# Patient Record
Sex: Male | Born: 1950
Health system: Southern US, Community
[De-identification: ages and names within clinical notes are randomized; demographics above are authoritative.]

## PROBLEM LIST (undated history)

## (undated) DIAGNOSIS — A419 Sepsis, unspecified organism: Secondary | ICD-10-CM

## (undated) DIAGNOSIS — M47812 Spondylosis without myelopathy or radiculopathy, cervical region: Secondary | ICD-10-CM

## (undated) DIAGNOSIS — G4730 Sleep apnea, unspecified: Secondary | ICD-10-CM

## (undated) DIAGNOSIS — E785 Hyperlipidemia, unspecified: Secondary | ICD-10-CM

## (undated) DIAGNOSIS — R51 Headache: Secondary | ICD-10-CM

## (undated) DIAGNOSIS — Z87891 Personal history of nicotine dependence: Secondary | ICD-10-CM

## (undated) DIAGNOSIS — M1990 Unspecified osteoarthritis, unspecified site: Secondary | ICD-10-CM

## (undated) DIAGNOSIS — R1013 Epigastric pain: Secondary | ICD-10-CM

## (undated) DIAGNOSIS — R011 Cardiac murmur, unspecified: Secondary | ICD-10-CM

## (undated) DIAGNOSIS — D3130 Benign neoplasm of unspecified choroid: Secondary | ICD-10-CM

## (undated) DIAGNOSIS — E790 Hyperuricemia without signs of inflammatory arthritis and tophaceous disease: Secondary | ICD-10-CM

## (undated) DIAGNOSIS — K760 Fatty (change of) liver, not elsewhere classified: Secondary | ICD-10-CM

## (undated) DIAGNOSIS — N390 Urinary tract infection, site not specified: Secondary | ICD-10-CM

## (undated) DIAGNOSIS — I5181 Takotsubo syndrome: Secondary | ICD-10-CM

## (undated) DIAGNOSIS — E119 Type 2 diabetes mellitus without complications: Secondary | ICD-10-CM

## (undated) DIAGNOSIS — E669 Obesity, unspecified: Secondary | ICD-10-CM

## (undated) DIAGNOSIS — K219 Gastro-esophageal reflux disease without esophagitis: Secondary | ICD-10-CM

## (undated) DIAGNOSIS — D508 Other iron deficiency anemias: Secondary | ICD-10-CM

## (undated) DIAGNOSIS — D1771 Benign lipomatous neoplasm of kidney: Secondary | ICD-10-CM

## (undated) DIAGNOSIS — I2510 Atherosclerotic heart disease of native coronary artery without angina pectoris: Secondary | ICD-10-CM

## (undated) DIAGNOSIS — R0789 Other chest pain: Secondary | ICD-10-CM

## (undated) DIAGNOSIS — Z8614 Personal history of Methicillin resistant Staphylococcus aureus infection: Secondary | ICD-10-CM

## (undated) DIAGNOSIS — Z9289 Personal history of other medical treatment: Secondary | ICD-10-CM

## (undated) HISTORY — DX: Type 2 diabetes mellitus without complications: E11.9

## (undated) HISTORY — DX: Personal history of other medical treatment: Z92.89

## (undated) HISTORY — DX: Epigastric pain: R10.13

## (undated) HISTORY — DX: Sepsis, unspecified organism: A41.9

## (undated) HISTORY — DX: Takotsubo syndrome: I51.81

## (undated) HISTORY — DX: Unspecified osteoarthritis, unspecified site: M19.90

## (undated) HISTORY — DX: Other chest pain: R07.89

## (undated) HISTORY — DX: Obesity, unspecified: E66.9

## (undated) HISTORY — DX: Gastro-esophageal reflux disease without esophagitis: K21.9

## (undated) HISTORY — DX: Other iron deficiency anemias: D50.8

## (undated) HISTORY — DX: Headache: R51

## (undated) HISTORY — DX: Cardiac murmur, unspecified: R01.1

## (undated) HISTORY — DX: Hyperuricemia without signs of inflammatory arthritis and tophaceous disease: E79.0

## (undated) HISTORY — DX: Benign neoplasm of unspecified choroid: D31.30

## (undated) HISTORY — DX: Sepsis, unspecified organism: N39.0

## (undated) HISTORY — PX: CARDIAC CATHETERIZATION: SHX172

## (undated) HISTORY — DX: Spondylosis without myelopathy or radiculopathy, cervical region: M47.812

## (undated) HISTORY — DX: Personal history of nicotine dependence: Z87.891

## (undated) HISTORY — DX: Sleep apnea, unspecified: G47.30

## (undated) HISTORY — DX: Hyperlipidemia, unspecified: E78.5

## (undated) HISTORY — DX: Fatty (change of) liver, not elsewhere classified: K76.0

## (undated) HISTORY — DX: Benign lipomatous neoplasm of kidney: D17.71

## (undated) HISTORY — DX: Atherosclerotic heart disease of native coronary artery without angina pectoris: I25.10

## (undated) HISTORY — DX: Personal history of Methicillin resistant Staphylococcus aureus infection: Z86.14

---

## 1986-04-20 HISTORY — PX: KNEE ARTHROSCOPY: SUR90

## 1995-06-19 ENCOUNTER — Encounter: Payer: Self-pay | Admitting: Family Medicine

## 1995-06-19 LAB — CONVERTED CEMR LAB: Hgb A1c MFr Bld: 12.3 %

## 1995-10-19 ENCOUNTER — Encounter: Payer: Self-pay | Admitting: Family Medicine

## 1995-10-19 LAB — CONVERTED CEMR LAB: Hgb A1c MFr Bld: 4.5 %

## 1996-04-20 ENCOUNTER — Encounter: Payer: Self-pay | Admitting: Family Medicine

## 1996-04-20 LAB — CONVERTED CEMR LAB
Hgb A1c MFr Bld: 4.8 %
Microalbumin U total vol: 12.5 mg/L

## 1997-03-20 ENCOUNTER — Encounter: Payer: Self-pay | Admitting: Family Medicine

## 1997-03-20 LAB — CONVERTED CEMR LAB: Hgb A1c MFr Bld: 5.8 %

## 1998-02-18 ENCOUNTER — Encounter: Payer: Self-pay | Admitting: Family Medicine

## 1998-02-18 LAB — CONVERTED CEMR LAB: Hgb A1c MFr Bld: 5.9 %

## 1998-08-19 ENCOUNTER — Encounter: Payer: Self-pay | Admitting: Family Medicine

## 1998-08-19 LAB — CONVERTED CEMR LAB: Microalbumin U total vol: 4.1 mg/L

## 1998-09-19 ENCOUNTER — Encounter: Payer: Self-pay | Admitting: Family Medicine

## 1998-09-19 LAB — CONVERTED CEMR LAB: Hgb A1c MFr Bld: 5.5 %

## 1998-09-27 DIAGNOSIS — I5181 Takotsubo syndrome: Secondary | ICD-10-CM | POA: Insufficient documentation

## 1998-09-27 HISTORY — DX: Takotsubo syndrome: I51.81

## 1999-03-21 ENCOUNTER — Encounter: Payer: Self-pay | Admitting: Family Medicine

## 1999-03-21 LAB — CONVERTED CEMR LAB: Hgb A1c MFr Bld: 6 %

## 1999-08-19 ENCOUNTER — Encounter: Payer: Self-pay | Admitting: Family Medicine

## 1999-08-19 LAB — CONVERTED CEMR LAB
Hgb A1c MFr Bld: 6.1 %
Microalbumin U total vol: 5.1 mg/L

## 2000-07-19 ENCOUNTER — Encounter: Payer: Self-pay | Admitting: Family Medicine

## 2000-07-19 LAB — CONVERTED CEMR LAB
Hgb A1c MFr Bld: 6 %
Microalbumin U total vol: 3.6 mg/L

## 2001-04-20 DIAGNOSIS — I2510 Atherosclerotic heart disease of native coronary artery without angina pectoris: Secondary | ICD-10-CM

## 2001-04-20 HISTORY — DX: Atherosclerotic heart disease of native coronary artery without angina pectoris: I25.10

## 2001-08-18 ENCOUNTER — Encounter: Payer: Self-pay | Admitting: Family Medicine

## 2001-08-18 LAB — CONVERTED CEMR LAB
Hgb A1c MFr Bld: 5.6 %
Microalbumin U total vol: 3.4 mg/L

## 2001-12-09 ENCOUNTER — Observation Stay (HOSPITAL_COMMUNITY): Admission: EM | Admit: 2001-12-09 | Discharge: 2001-12-12 | Payer: Self-pay | Admitting: Cardiology

## 2001-12-12 DIAGNOSIS — I2510 Atherosclerotic heart disease of native coronary artery without angina pectoris: Secondary | ICD-10-CM | POA: Insufficient documentation

## 2002-01-18 ENCOUNTER — Encounter: Payer: Self-pay | Admitting: Family Medicine

## 2002-01-18 LAB — CONVERTED CEMR LAB: PSA: 0.4 ng/mL

## 2002-08-19 ENCOUNTER — Encounter: Payer: Self-pay | Admitting: Family Medicine

## 2002-08-19 LAB — CONVERTED CEMR LAB
Hgb A1c MFr Bld: 6 %
Microalbumin U total vol: 2.6 mg/L
PSA: 0.4 ng/mL

## 2002-12-06 LAB — HM COLONOSCOPY: HM Colonoscopy: NORMAL

## 2003-04-06 ENCOUNTER — Encounter: Admission: RE | Admit: 2003-04-06 | Discharge: 2003-04-06 | Payer: Self-pay | Admitting: Orthopedic Surgery

## 2003-04-19 ENCOUNTER — Encounter: Admission: RE | Admit: 2003-04-19 | Discharge: 2003-04-19 | Payer: Self-pay | Admitting: Orthopedic Surgery

## 2003-05-21 HISTORY — PX: KNEE SURGERY: SHX244

## 2003-08-19 ENCOUNTER — Encounter: Payer: Self-pay | Admitting: Family Medicine

## 2003-08-19 LAB — CONVERTED CEMR LAB
Hgb A1c MFr Bld: 5.7 %
Microalbumin U total vol: 1.7 mg/L
PSA: 0.4 ng/mL

## 2003-11-02 ENCOUNTER — Ambulatory Visit (HOSPITAL_BASED_OUTPATIENT_CLINIC_OR_DEPARTMENT_OTHER): Admission: RE | Admit: 2003-11-02 | Discharge: 2003-11-02 | Payer: Self-pay | Admitting: Pulmonary Disease

## 2003-11-15 ENCOUNTER — Encounter: Admission: RE | Admit: 2003-11-15 | Discharge: 2003-11-15 | Payer: Self-pay | Admitting: Neurosurgery

## 2003-11-19 HISTORY — PX: MYELOGRAM: SHX5347

## 2004-01-02 ENCOUNTER — Ambulatory Visit (HOSPITAL_BASED_OUTPATIENT_CLINIC_OR_DEPARTMENT_OTHER): Admission: RE | Admit: 2004-01-02 | Discharge: 2004-01-02 | Payer: Self-pay | Admitting: Pulmonary Disease

## 2004-03-20 ENCOUNTER — Encounter: Payer: Self-pay | Admitting: Family Medicine

## 2004-03-20 LAB — CONVERTED CEMR LAB: Hgb A1c MFr Bld: 6 %

## 2004-04-08 ENCOUNTER — Ambulatory Visit: Payer: Self-pay | Admitting: Family Medicine

## 2004-04-10 ENCOUNTER — Ambulatory Visit: Payer: Self-pay | Admitting: Family Medicine

## 2004-06-09 ENCOUNTER — Ambulatory Visit: Payer: Self-pay | Admitting: Family Medicine

## 2004-06-16 ENCOUNTER — Ambulatory Visit: Payer: Self-pay | Admitting: Family Medicine

## 2004-08-20 ENCOUNTER — Ambulatory Visit: Payer: Self-pay | Admitting: Family Medicine

## 2004-08-21 ENCOUNTER — Ambulatory Visit: Payer: Self-pay | Admitting: Family Medicine

## 2004-09-17 ENCOUNTER — Ambulatory Visit: Payer: Self-pay | Admitting: Family Medicine

## 2004-12-15 ENCOUNTER — Ambulatory Visit: Payer: Self-pay | Admitting: Family Medicine

## 2004-12-19 DIAGNOSIS — Z9289 Personal history of other medical treatment: Secondary | ICD-10-CM

## 2004-12-19 HISTORY — DX: Personal history of other medical treatment: Z92.89

## 2005-03-20 ENCOUNTER — Encounter: Payer: Self-pay | Admitting: Family Medicine

## 2005-03-20 LAB — CONVERTED CEMR LAB: Hgb A1c MFr Bld: 5.5 %

## 2005-03-24 ENCOUNTER — Ambulatory Visit: Payer: Self-pay | Admitting: Family Medicine

## 2005-03-27 ENCOUNTER — Ambulatory Visit: Payer: Self-pay | Admitting: Family Medicine

## 2005-07-10 ENCOUNTER — Ambulatory Visit: Payer: Self-pay | Admitting: Family Medicine

## 2005-09-18 ENCOUNTER — Encounter: Payer: Self-pay | Admitting: Family Medicine

## 2005-09-18 LAB — CONVERTED CEMR LAB: Hgb A1c MFr Bld: 6.8 %

## 2005-09-30 ENCOUNTER — Ambulatory Visit: Payer: Self-pay | Admitting: Family Medicine

## 2005-09-30 LAB — CONVERTED CEMR LAB
Microalbumin U total vol: 2.1 mg/L
PSA: 0.47 ng/mL

## 2005-10-02 ENCOUNTER — Ambulatory Visit: Payer: Self-pay | Admitting: Family Medicine

## 2005-10-13 ENCOUNTER — Ambulatory Visit: Payer: Self-pay | Admitting: Family Medicine

## 2005-12-23 ENCOUNTER — Ambulatory Visit: Payer: Self-pay | Admitting: Family Medicine

## 2006-02-25 ENCOUNTER — Encounter: Admission: RE | Admit: 2006-02-25 | Discharge: 2006-02-25 | Payer: Self-pay | Admitting: Internal Medicine

## 2006-04-28 ENCOUNTER — Ambulatory Visit: Payer: Self-pay | Admitting: Family Medicine

## 2006-04-28 LAB — CONVERTED CEMR LAB: Hgb A1c MFr Bld: 6.6 %

## 2006-04-30 ENCOUNTER — Ambulatory Visit: Payer: Self-pay | Admitting: Family Medicine

## 2006-10-18 ENCOUNTER — Ambulatory Visit: Payer: Self-pay | Admitting: Family Medicine

## 2006-10-18 DIAGNOSIS — E1165 Type 2 diabetes mellitus with hyperglycemia: Secondary | ICD-10-CM

## 2006-10-18 DIAGNOSIS — E1140 Type 2 diabetes mellitus with diabetic neuropathy, unspecified: Secondary | ICD-10-CM

## 2006-10-18 DIAGNOSIS — E1142 Type 2 diabetes mellitus with diabetic polyneuropathy: Secondary | ICD-10-CM | POA: Insufficient documentation

## 2006-10-18 DIAGNOSIS — E1122 Type 2 diabetes mellitus with diabetic chronic kidney disease: Secondary | ICD-10-CM | POA: Insufficient documentation

## 2006-10-18 LAB — CONVERTED CEMR LAB
ALT: 22 units/L (ref 0–53)
AST: 26 units/L (ref 0–37)
Albumin: 3.8 g/dL (ref 3.5–5.2)
Alkaline Phosphatase: 46 units/L (ref 39–117)
BUN: 27 mg/dL — ABNORMAL HIGH (ref 6–23)
Basophils Absolute: 0 10*3/uL (ref 0.0–0.1)
Basophils Relative: 0.3 % (ref 0.0–1.0)
Bilirubin, Direct: 0.1 mg/dL (ref 0.0–0.3)
CO2: 29 meq/L (ref 19–32)
Calcium: 9.6 mg/dL (ref 8.4–10.5)
Chloride: 111 meq/L (ref 96–112)
Cholesterol: 135 mg/dL (ref 0–200)
Creatinine, Ser: 0.9 mg/dL (ref 0.4–1.5)
Creatinine,U: 195.3 mg/dL
Eosinophils Absolute: 0.1 10*3/uL (ref 0.0–0.6)
Eosinophils Relative: 2.9 % (ref 0.0–5.0)
GFR calc Af Amer: 113 mL/min
GFR calc non Af Amer: 93 mL/min
Glucose, Bld: 105 mg/dL — ABNORMAL HIGH (ref 70–99)
HCT: 36.1 % — ABNORMAL LOW (ref 39.0–52.0)
HDL: 33.4 mg/dL — ABNORMAL LOW (ref >39.0)
Hemoglobin: 11.8 g/dL — ABNORMAL LOW (ref 13.0–17.0)
Hgb A1c MFr Bld: 6.8 % — ABNORMAL HIGH (ref 4.6–6.0)
LDL Cholesterol: 86 mg/dL (ref 0–99)
Lymphocytes Relative: 26.3 % (ref 12.0–46.0)
MCHC: 32.6 g/dL (ref 30.0–36.0)
MCV: 72.3 fL — ABNORMAL LOW (ref 78.0–100.0)
Microalb Creat Ratio: 2.6 mg/g (ref 0.0–30.0)
Microalb, Ur: 0.5 mg/dL (ref 0.0–1.9)
Monocytes Absolute: 0.3 10*3/uL (ref 0.2–0.7)
Monocytes Relative: 6.4 % (ref 3.0–11.0)
Neutro Abs: 3.3 10*3/uL (ref 1.4–7.7)
Neutrophils Relative %: 64.1 % (ref 43.0–77.0)
PSA: 0.62 ng/mL (ref 0.10–4.00)
Platelets: 294 10*3/uL (ref 150–400)
Potassium: 4.8 meq/L (ref 3.5–5.1)
RBC: 5 M/uL (ref 4.22–5.81)
RDW: 17.7 % — ABNORMAL HIGH (ref 11.5–14.6)
Sodium: 143 meq/L (ref 135–145)
TSH: 1.71 microintl units/mL (ref 0.35–5.50)
Total Bilirubin: 0.6 mg/dL (ref 0.3–1.2)
Total CHOL/HDL Ratio: 4
Total Protein: 6.8 g/dL (ref 6.0–8.3)
Triglycerides: 76 mg/dL (ref 0–149)
VLDL: 15 mg/dL (ref 0–40)
WBC: 5 10*3/uL (ref 4.5–10.5)

## 2006-10-21 ENCOUNTER — Ambulatory Visit: Payer: Self-pay | Admitting: Family Medicine

## 2006-10-21 DIAGNOSIS — E611 Iron deficiency: Secondary | ICD-10-CM | POA: Insufficient documentation

## 2006-10-27 ENCOUNTER — Telehealth: Payer: Self-pay | Admitting: Family Medicine

## 2006-11-03 ENCOUNTER — Encounter (INDEPENDENT_AMBULATORY_CARE_PROVIDER_SITE_OTHER): Payer: Self-pay | Admitting: *Deleted

## 2006-11-03 ENCOUNTER — Ambulatory Visit: Payer: Self-pay | Admitting: Family Medicine

## 2007-01-24 ENCOUNTER — Ambulatory Visit: Payer: Self-pay | Admitting: Family Medicine

## 2007-01-24 LAB — CONVERTED CEMR LAB
Basophils Absolute: 0 10*3/uL (ref 0.0–0.1)
Basophils Relative: 0.3 % (ref 0.0–1.0)
Eosinophils Absolute: 0.1 10*3/uL (ref 0.0–0.6)
Eosinophils Relative: 2.9 % (ref 0.0–5.0)
HCT: 44.5 % (ref 39.0–52.0)
Hemoglobin: 14.6 g/dL (ref 13.0–17.0)
Hgb A1c MFr Bld: 5.6 % (ref 4.6–6.0)
Lymphocytes Relative: 23.3 % (ref 12.0–46.0)
MCHC: 32.9 g/dL (ref 30.0–36.0)
MCV: 82.2 fL (ref 78.0–100.0)
Monocytes Absolute: 0.4 10*3/uL (ref 0.2–0.7)
Monocytes Relative: 7.7 % (ref 3.0–11.0)
Neutro Abs: 3.2 10*3/uL (ref 1.4–7.7)
Neutrophils Relative %: 65.8 % (ref 43.0–77.0)
Platelets: 223 10*3/uL (ref 150–400)
RBC: 5.41 M/uL (ref 4.22–5.81)
RDW: 20.1 % — ABNORMAL HIGH (ref 11.5–14.6)
Uric Acid, Serum: 6 mg/dL (ref 2.4–7.0)
WBC: 4.8 10*3/uL (ref 4.5–10.5)

## 2007-01-26 ENCOUNTER — Ambulatory Visit: Payer: Self-pay | Admitting: Family Medicine

## 2007-03-31 ENCOUNTER — Telehealth (INDEPENDENT_AMBULATORY_CARE_PROVIDER_SITE_OTHER): Payer: Self-pay | Admitting: *Deleted

## 2007-06-10 ENCOUNTER — Ambulatory Visit: Payer: Self-pay | Admitting: Family Medicine

## 2007-06-10 LAB — CONVERTED CEMR LAB: Hgb A1c MFr Bld: 5.9 % (ref 4.6–6.0)

## 2007-06-13 ENCOUNTER — Ambulatory Visit: Payer: Self-pay | Admitting: Family Medicine

## 2007-10-26 ENCOUNTER — Ambulatory Visit: Payer: Self-pay | Admitting: Family Medicine

## 2007-10-26 LAB — CONVERTED CEMR LAB
ALT: 32 units/L (ref 0–53)
AST: 31 units/L (ref 0–37)
Albumin: 4 g/dL (ref 3.5–5.2)
Alkaline Phosphatase: 47 units/L (ref 39–117)
BUN: 20 mg/dL (ref 6–23)
Basophils Absolute: 0 10*3/uL (ref 0.0–0.1)
Basophils Relative: 0.6 % (ref 0.0–1.0)
Bilirubin, Direct: 0.1 mg/dL (ref 0.0–0.3)
CO2: 30 meq/L (ref 19–32)
Calcium: 9.8 mg/dL (ref 8.4–10.5)
Chloride: 106 meq/L (ref 96–112)
Cholesterol: 120 mg/dL (ref 0–200)
Creatinine, Ser: 1.1 mg/dL (ref 0.4–1.5)
Creatinine,U: 199.6 mg/dL
Eosinophils Absolute: 0.2 10*3/uL (ref 0.0–0.7)
Eosinophils Relative: 3.3 % (ref 0.0–5.0)
GFR calc Af Amer: 89 mL/min
GFR calc non Af Amer: 74 mL/min
Glucose, Bld: 124 mg/dL — ABNORMAL HIGH (ref 70–99)
HCT: 46.7 % (ref 39.0–52.0)
HDL: 33.8 mg/dL — ABNORMAL LOW (ref >39.0)
Hemoglobin: 15.8 g/dL (ref 13.0–17.0)
Hgb A1c MFr Bld: 6.1 % — ABNORMAL HIGH (ref 4.6–6.0)
Iron: 40 ug/dL — ABNORMAL LOW (ref 42–165)
LDL Cholesterol: 75 mg/dL (ref 0–99)
Lymphocytes Relative: 26.5 % (ref 12.0–46.0)
MCHC: 33.8 g/dL (ref 30.0–36.0)
MCV: 86.7 fL (ref 78.0–100.0)
Microalb Creat Ratio: 1.5 mg/g (ref 0.0–30.0)
Microalb, Ur: 0.3 mg/dL (ref 0.0–1.9)
Monocytes Absolute: 0.3 10*3/uL (ref 0.1–1.0)
Monocytes Relative: 6.6 % (ref 3.0–12.0)
Neutro Abs: 3.4 10*3/uL (ref 1.4–7.7)
Neutrophils Relative %: 63 % (ref 43.0–77.0)
PSA: 1.42 ng/mL (ref 0.10–4.00)
Platelets: 260 10*3/uL (ref 150–400)
Potassium: 4.7 meq/L (ref 3.5–5.1)
RBC: 5.38 M/uL (ref 4.22–5.81)
RDW: 13.8 % (ref 11.5–14.6)
Sodium: 143 meq/L (ref 135–145)
TSH: 3.27 microintl units/mL (ref 0.35–5.50)
Total Bilirubin: 0.7 mg/dL (ref 0.3–1.2)
Total CHOL/HDL Ratio: 3.6
Total Protein: 7 g/dL (ref 6.0–8.3)
Triglycerides: 54 mg/dL (ref 0–149)
Uric Acid, Serum: 8.4 mg/dL — ABNORMAL HIGH (ref 4.0–7.8)
VLDL: 11 mg/dL (ref 0–40)
Vitamin B-12: 528 pg/mL (ref 211–911)
WBC: 5.3 10*3/uL (ref 4.5–10.5)

## 2007-10-28 ENCOUNTER — Ambulatory Visit: Payer: Self-pay | Admitting: Family Medicine

## 2007-11-11 ENCOUNTER — Ambulatory Visit: Payer: Self-pay | Admitting: Family Medicine

## 2007-11-11 LAB — CONVERTED CEMR LAB
OCCULT 1: NEGATIVE
OCCULT 2: NEGATIVE
OCCULT 3: NEGATIVE

## 2007-11-14 ENCOUNTER — Encounter (INDEPENDENT_AMBULATORY_CARE_PROVIDER_SITE_OTHER): Payer: Self-pay | Admitting: *Deleted

## 2007-12-27 ENCOUNTER — Encounter: Payer: Self-pay | Admitting: Family Medicine

## 2008-04-19 ENCOUNTER — Telehealth: Payer: Self-pay | Admitting: Family Medicine

## 2008-04-22 ENCOUNTER — Telehealth: Payer: Self-pay | Admitting: Internal Medicine

## 2008-04-22 ENCOUNTER — Emergency Department (HOSPITAL_COMMUNITY): Admission: EM | Admit: 2008-04-22 | Discharge: 2008-04-22 | Payer: Self-pay | Admitting: Family Medicine

## 2008-04-22 ENCOUNTER — Encounter: Payer: Self-pay | Admitting: Family Medicine

## 2008-04-23 ENCOUNTER — Emergency Department (HOSPITAL_COMMUNITY): Admission: EM | Admit: 2008-04-23 | Discharge: 2008-04-23 | Payer: Self-pay | Admitting: Emergency Medicine

## 2008-04-23 DIAGNOSIS — A4159 Other Gram-negative sepsis: Secondary | ICD-10-CM | POA: Insufficient documentation

## 2008-04-24 ENCOUNTER — Telehealth: Payer: Self-pay | Admitting: Family Medicine

## 2008-04-24 ENCOUNTER — Ambulatory Visit: Payer: Self-pay | Admitting: Family Medicine

## 2008-04-27 ENCOUNTER — Encounter (INDEPENDENT_AMBULATORY_CARE_PROVIDER_SITE_OTHER): Payer: Self-pay | Admitting: *Deleted

## 2008-04-27 ENCOUNTER — Inpatient Hospital Stay (HOSPITAL_COMMUNITY): Admission: AD | Admit: 2008-04-27 | Discharge: 2008-04-29 | Payer: Self-pay | Admitting: Internal Medicine

## 2008-04-27 ENCOUNTER — Telehealth (INDEPENDENT_AMBULATORY_CARE_PROVIDER_SITE_OTHER): Payer: Self-pay | Admitting: Internal Medicine

## 2008-04-27 ENCOUNTER — Ambulatory Visit: Payer: Self-pay | Admitting: Internal Medicine

## 2008-04-27 ENCOUNTER — Ambulatory Visit: Payer: Self-pay | Admitting: Family Medicine

## 2008-04-27 LAB — CONVERTED CEMR LAB
Bilirubin Urine: NEGATIVE
Glucose, Urine, Semiquant: NEGATIVE
Ketones, urine, test strip: NEGATIVE
Nitrite: NEGATIVE
Specific Gravity, Urine: >=1.03
Urobilinogen, UA: 0.2
WBC Urine, dipstick: NEGATIVE
pH: 6

## 2008-05-03 ENCOUNTER — Ambulatory Visit: Payer: Self-pay | Admitting: Family Medicine

## 2008-05-03 LAB — CONVERTED CEMR LAB
Bilirubin Urine: NEGATIVE
Blood in Urine, dipstick: NEGATIVE
Glucose, Urine, Semiquant: NEGATIVE
Ketones, urine, test strip: NEGATIVE
Nitrite: NEGATIVE
Protein, U semiquant: NEGATIVE
Specific Gravity, Urine: >=1.03
Urobilinogen, UA: 0.2
WBC Urine, dipstick: NEGATIVE
pH: 6

## 2008-05-05 ENCOUNTER — Encounter (INDEPENDENT_AMBULATORY_CARE_PROVIDER_SITE_OTHER): Payer: Self-pay | Admitting: Internal Medicine

## 2008-05-06 ENCOUNTER — Encounter (INDEPENDENT_AMBULATORY_CARE_PROVIDER_SITE_OTHER): Payer: Self-pay | Admitting: Internal Medicine

## 2008-05-06 LAB — CONVERTED CEMR LAB
Bacteria, UA: 0
Epithelial cells, urine: 0 /lpf
RBC / HPF: 0

## 2008-12-15 DIAGNOSIS — Z8679 Personal history of other diseases of the circulatory system: Secondary | ICD-10-CM | POA: Insufficient documentation

## 2008-12-15 DIAGNOSIS — Z9889 Other specified postprocedural states: Secondary | ICD-10-CM | POA: Insufficient documentation

## 2009-01-29 ENCOUNTER — Encounter: Payer: Self-pay | Admitting: Family Medicine

## 2009-01-31 ENCOUNTER — Ambulatory Visit: Payer: Self-pay | Admitting: Family Medicine

## 2009-01-31 LAB — CONVERTED CEMR LAB
ALT: 36 units/L (ref 0–53)
AST: 31 units/L (ref 0–37)
Albumin: 4.1 g/dL (ref 3.5–5.2)
Alkaline Phosphatase: 43 units/L (ref 39–117)
BUN: 21 mg/dL (ref 6–23)
Basophils Absolute: 0 10*3/uL (ref 0.0–0.1)
Basophils Relative: 0.8 % (ref 0.0–3.0)
Bilirubin, Direct: 0.1 mg/dL (ref 0.0–0.3)
CO2: 26 meq/L (ref 19–32)
Calcium: 9.4 mg/dL (ref 8.4–10.5)
Chloride: 103 meq/L (ref 96–112)
Cholesterol: 117 mg/dL (ref 0–200)
Creatinine, Ser: 1 mg/dL (ref 0.4–1.5)
Creatinine,U: 456.8 mg/dL
Eosinophils Absolute: 0.1 10*3/uL (ref 0.0–0.7)
Eosinophils Relative: 2.1 % (ref 0.0–5.0)
GFR calc non Af Amer: 81.68 mL/min (ref >60)
Glucose, Bld: 121 mg/dL — ABNORMAL HIGH (ref 70–99)
HCT: 41.3 % (ref 39.0–52.0)
HDL: 31.8 mg/dL — ABNORMAL LOW (ref >39.00)
Hemoglobin: 14.4 g/dL (ref 13.0–17.0)
Hgb A1c MFr Bld: 6.1 % (ref 4.6–6.5)
Iron: 38 ug/dL — ABNORMAL LOW (ref 42–165)
LDL Cholesterol: 74 mg/dL (ref 0–99)
Lymphocytes Relative: 22.8 % (ref 12.0–46.0)
Lymphs Abs: 1.4 10*3/uL (ref 0.7–4.0)
MCHC: 34.8 g/dL (ref 30.0–36.0)
MCV: 89.1 fL (ref 78.0–100.0)
Microalb Creat Ratio: 5 mg/g (ref 0.0–30.0)
Microalb, Ur: 2.3 mg/dL — ABNORMAL HIGH (ref 0.0–1.9)
Monocytes Absolute: 0.5 10*3/uL (ref 0.1–1.0)
Monocytes Relative: 7.5 % (ref 3.0–12.0)
Neutro Abs: 4 10*3/uL (ref 1.4–7.7)
Neutrophils Relative %: 66.8 % (ref 43.0–77.0)
PSA: 3.13 ng/mL (ref 0.10–4.00)
Platelets: 194 10*3/uL (ref 150.0–400.0)
Potassium: 4.2 meq/L (ref 3.5–5.1)
RBC: 4.64 M/uL (ref 4.22–5.81)
RDW: 13.8 % (ref 11.5–14.6)
Sodium: 139 meq/L (ref 135–145)
TSH: 1.65 microintl units/mL (ref 0.35–5.50)
Total Bilirubin: 0.8 mg/dL (ref 0.3–1.2)
Total CHOL/HDL Ratio: 4
Total Protein: 7.1 g/dL (ref 6.0–8.3)
Triglycerides: 55 mg/dL (ref 0.0–149.0)
Uric Acid, Serum: 7.9 mg/dL — ABNORMAL HIGH (ref 4.0–7.8)
VLDL: 11 mg/dL (ref 0.0–40.0)
Vitamin B-12: 502 pg/mL (ref 211–911)
WBC: 6 10*3/uL (ref 4.5–10.5)

## 2009-02-01 LAB — CONVERTED CEMR LAB: Vit D, 25-Hydroxy: 33 ng/mL (ref 30–89)

## 2009-02-04 ENCOUNTER — Ambulatory Visit: Payer: Self-pay | Admitting: Family Medicine

## 2009-02-04 DIAGNOSIS — G4730 Sleep apnea, unspecified: Secondary | ICD-10-CM | POA: Insufficient documentation

## 2009-02-04 DIAGNOSIS — N138 Other obstructive and reflux uropathy: Secondary | ICD-10-CM | POA: Insufficient documentation

## 2009-02-04 DIAGNOSIS — G4733 Obstructive sleep apnea (adult) (pediatric): Secondary | ICD-10-CM | POA: Insufficient documentation

## 2009-02-04 DIAGNOSIS — N401 Enlarged prostate with lower urinary tract symptoms: Secondary | ICD-10-CM | POA: Insufficient documentation

## 2009-02-27 ENCOUNTER — Encounter (INDEPENDENT_AMBULATORY_CARE_PROVIDER_SITE_OTHER): Payer: Self-pay | Admitting: *Deleted

## 2009-02-27 ENCOUNTER — Ambulatory Visit: Payer: Self-pay | Admitting: Family Medicine

## 2009-02-27 LAB — CONVERTED CEMR LAB
OCCULT 1: NEGATIVE
OCCULT 2: NEGATIVE
OCCULT 3: NEGATIVE

## 2009-02-27 LAB — FECAL OCCULT BLOOD, GUAIAC: Fecal Occult Blood: NEGATIVE

## 2009-03-07 ENCOUNTER — Ambulatory Visit: Payer: Self-pay | Admitting: Family Medicine

## 2009-03-11 ENCOUNTER — Ambulatory Visit: Payer: Self-pay | Admitting: Family Medicine

## 2009-03-11 DIAGNOSIS — E291 Testicular hypofunction: Secondary | ICD-10-CM | POA: Insufficient documentation

## 2009-03-11 LAB — CONVERTED CEMR LAB
PSA: 2.63 ng/mL (ref 0.10–4.00)
Testosterone: 169.8 ng/dL — ABNORMAL LOW (ref 350.00–890.00)

## 2009-03-27 ENCOUNTER — Encounter: Payer: Self-pay | Admitting: Family Medicine

## 2009-04-16 ENCOUNTER — Encounter: Payer: Self-pay | Admitting: Family Medicine

## 2009-04-29 ENCOUNTER — Telehealth: Payer: Self-pay | Admitting: Family Medicine

## 2009-06-10 ENCOUNTER — Ambulatory Visit: Payer: Self-pay | Admitting: Family Medicine

## 2009-06-11 LAB — CONVERTED CEMR LAB
ALT: 30 units/L (ref 0–53)
AST: 28 units/L (ref 0–37)
Hgb A1c MFr Bld: 7.1 % — ABNORMAL HIGH (ref 4.6–6.5)

## 2009-06-12 ENCOUNTER — Ambulatory Visit: Payer: Self-pay | Admitting: Family Medicine

## 2009-07-29 ENCOUNTER — Ambulatory Visit: Payer: Self-pay | Admitting: Family Medicine

## 2009-07-29 LAB — CONVERTED CEMR LAB
Bacteria, UA: 0
Bilirubin Urine: NEGATIVE
Blood in Urine, dipstick: NEGATIVE
Glucose, Urine, Semiquant: NEGATIVE
Ketones, urine, test strip: NEGATIVE
Nitrite: NEGATIVE
Specific Gravity, Urine: 1.02
Urobilinogen, UA: 0.2
WBC Urine, dipstick: NEGATIVE
pH: 6

## 2009-09-10 ENCOUNTER — Ambulatory Visit: Payer: Self-pay | Admitting: Family Medicine

## 2009-09-11 LAB — CONVERTED CEMR LAB: Hgb A1c MFr Bld: 7.4 % — ABNORMAL HIGH (ref 4.6–6.5)

## 2009-09-12 ENCOUNTER — Ambulatory Visit: Payer: Self-pay | Admitting: Family Medicine

## 2009-11-26 ENCOUNTER — Encounter (INDEPENDENT_AMBULATORY_CARE_PROVIDER_SITE_OTHER): Payer: Self-pay | Admitting: *Deleted

## 2009-12-24 ENCOUNTER — Telehealth: Payer: Self-pay | Admitting: Family Medicine

## 2010-01-23 ENCOUNTER — Ambulatory Visit: Payer: Self-pay | Admitting: Family Medicine

## 2010-01-30 ENCOUNTER — Ambulatory Visit: Payer: Self-pay | Admitting: Family Medicine

## 2010-02-12 ENCOUNTER — Encounter: Payer: Self-pay | Admitting: Family Medicine

## 2010-04-23 ENCOUNTER — Other Ambulatory Visit: Payer: Self-pay | Admitting: Family Medicine

## 2010-04-23 ENCOUNTER — Ambulatory Visit
Admission: RE | Admit: 2010-04-23 | Discharge: 2010-04-23 | Payer: Self-pay | Source: Home / Self Care | Attending: Family Medicine | Admitting: Family Medicine

## 2010-04-23 ENCOUNTER — Encounter: Payer: Self-pay | Admitting: Family Medicine

## 2010-04-23 LAB — CBC WITH DIFFERENTIAL/PLATELET
Basophils Absolute: 0 10*3/uL (ref 0.0–0.1)
Basophils Relative: 0.5 % (ref 0.0–3.0)
Eosinophils Absolute: 0.1 10*3/uL (ref 0.0–0.7)
Eosinophils Relative: 2.1 % (ref 0.0–5.0)
HCT: 36.8 % — ABNORMAL LOW (ref 39.0–52.0)
Hemoglobin: 11.7 g/dL — ABNORMAL LOW (ref 13.0–17.0)
Lymphocytes Relative: 18.1 % (ref 12.0–46.0)
Lymphs Abs: 1.1 10*3/uL (ref 0.7–4.0)
MCHC: 31.9 g/dL (ref 30.0–36.0)
MCV: 70.5 fl — ABNORMAL LOW (ref 78.0–100.0)
Monocytes Absolute: 0.4 10*3/uL (ref 0.1–1.0)
Monocytes Relative: 7.1 % (ref 3.0–12.0)
Neutro Abs: 4.5 10*3/uL (ref 1.4–7.7)
Neutrophils Relative %: 72.2 % (ref 43.0–77.0)
Platelets: 309 10*3/uL (ref 150.0–400.0)
RBC: 5.22 Mil/uL (ref 4.22–5.81)
RDW: 18 % — ABNORMAL HIGH (ref 11.5–14.6)
WBC: 6.3 10*3/uL (ref 4.5–10.5)

## 2010-04-23 LAB — HEPATIC FUNCTION PANEL
ALT: 33 U/L (ref 0–53)
AST: 32 U/L (ref 0–37)
Albumin: 3.8 g/dL (ref 3.5–5.2)
Alkaline Phosphatase: 51 U/L (ref 39–117)
Bilirubin, Direct: 0.1 mg/dL (ref 0.0–0.3)
Total Bilirubin: 0.5 mg/dL (ref 0.3–1.2)
Total Protein: 7 g/dL (ref 6.0–8.3)

## 2010-04-23 LAB — IRON: Iron: 23 ug/dL — ABNORMAL LOW (ref 42–165)

## 2010-04-23 LAB — BASIC METABOLIC PANEL
BUN: 23 mg/dL (ref 6–23)
CO2: 27 mEq/L (ref 19–32)
Calcium: 9.7 mg/dL (ref 8.4–10.5)
Chloride: 105 mEq/L (ref 96–112)
Creatinine, Ser: 1 mg/dL (ref 0.4–1.5)
GFR: 82.28 mL/min (ref >60.00)
Glucose, Bld: 115 mg/dL — ABNORMAL HIGH (ref 70–99)
Potassium: 4.8 mEq/L (ref 3.5–5.1)
Sodium: 139 mEq/L (ref 135–145)

## 2010-04-23 LAB — LIPID PANEL
Cholesterol: 126 mg/dL (ref 0–200)
HDL: 32.6 mg/dL — ABNORMAL LOW (ref >39.00)
LDL Cholesterol: 87 mg/dL (ref 0–99)
Total CHOL/HDL Ratio: 4
Triglycerides: 34 mg/dL (ref 0.0–149.0)
VLDL: 6.8 mg/dL (ref 0.0–40.0)

## 2010-04-23 LAB — MICROALBUMIN / CREATININE URINE RATIO
Creatinine,U: 137.4 mg/dL
Microalb Creat Ratio: 0.4 mg/g (ref 0.0–30.0)
Microalb, Ur: 0.5 mg/dL (ref 0.0–1.9)

## 2010-04-23 LAB — HEMOGLOBIN A1C: Hgb A1c MFr Bld: 7.2 % — ABNORMAL HIGH (ref 4.6–6.5)

## 2010-04-23 LAB — VITAMIN B12: Vitamin B-12: 346 pg/mL (ref 211–911)

## 2010-04-23 LAB — PSA: PSA: 0.82 ng/mL (ref 0.10–4.00)

## 2010-04-23 LAB — TSH: TSH: 1.53 u[IU]/mL (ref 0.35–5.50)

## 2010-04-24 LAB — URIC ACID: Uric Acid, Serum: 4.7 mg/dL (ref 4.0–7.8)

## 2010-04-30 ENCOUNTER — Ambulatory Visit
Admission: RE | Admit: 2010-04-30 | Discharge: 2010-04-30 | Payer: Self-pay | Source: Home / Self Care | Attending: Family Medicine | Admitting: Family Medicine

## 2010-04-30 DIAGNOSIS — R002 Palpitations: Secondary | ICD-10-CM | POA: Insufficient documentation

## 2010-04-30 LAB — HM DIABETES FOOT EXAM

## 2010-05-20 NOTE — Progress Notes (Signed)
Summary: feeling sick   Phone Note Call from Patient Call back at Home Phone [phone]   Caller: Patient Call For: Shaune Leeks MD/ Dr. Milinda Antis Summary of Call: Patient says that he has runny nose, coughing, and sore throat that started on Thursday of last week. He wants something called in and I told him that he would need to be seen first. He said that he does not want to come in he just wants to know if someone can give him advise on what to take over the counter.  Initial call taken by: Melody Comas,  April 29, 2009 9:42 AM  Follow-up for Phone Call        I recommend plain mucinex for congestion and plain claritin for runny nose  tylenol for sore throat or fever  f/u if worse or not imp Follow-up by: Judith Part MD,  April 29, 2009 9:55 AM  Additional Follow-up for Phone Call Additional follow up Details #1::        Advised pt. Additional Follow-up by: Lowella Petties CMA,  April 29, 2009 9:58 AM

## 2010-05-20 NOTE — Letter (Signed)
Summary: Dr.C[facility] Examination Report  Dr.C.[facility] Examination Report   Imported By: Beau Fanny 02/19/2010 15:12:35  _____________________________________________________________________  External Attachment:    Type:   Image     Comment:   External Document  Appended Document: Dr.C[facility] Examination Report    Clinical Lists Changes  Observations: Added new observation of DMEYEEXAMNXT: 02/2011 (02/19/2010 17:42) Added new observation of DMEYEEXMRES: normal (02/12/2010 17:43) Added new observation of EYE EXAM BY: Dr Salvatore Marvel (02/12/2010 17:43) Added new observation of DIAB EYE EX: normal (02/12/2010 17:43)        Diabetes Management Exam:    Eye Exam:       Eye Exam done elsewhere          Date: 02/12/2010          Results: normal          Done by: Dr Salvatore Marvel

## 2010-05-20 NOTE — Progress Notes (Signed)
Summary: Rx Metformin and Glimepiride  Phone Note Refill Request Call back at [phone] Message from:  [facility] on December 24, 2009 11:45 AM  Refills Requested: Medication #1:  METFORMIN HCL 1000 MG TABS one tab by mouth two times a day  Medication #2:  GLIMEPIRIDE 4 MG TABS one tab by mouth two times a day  Method Requested: Electronic Initial call taken by: Sydell Axon LPN,  December 24, 2009 11:47 AM    Prescriptions: GLIMEPIRIDE 4 MG TABS (GLIMEPIRIDE) one tab by mouth two times a day  #180 x 3   Entered by:   Sydell Axon LPN   Authorized by:   Crawford Givens MD   Signed by:   Sydell Axon LPN on 93/23/5573   Method used:   Faxed to ...       [facility] (mail-order)             , [place]         Ph: [phone]       Fax: [phone]   RxID:   [id number] METFORMIN HCL 1000 MG TABS (METFORMIN HCL) one tab by mouth two times a day  #180 x 3   Entered by:   Sydell Axon LPN   Authorized by:   Crawford Givens MD   Signed by:   Sydell Axon LPN on 54/62/7035   Method used:   Faxed to ...       [facility] (mail-order)             , [place]         Ph: [phone]       Fax: [phone]   RxID:   [id number]

## 2010-05-20 NOTE — Assessment & Plan Note (Signed)
Summary: F/U AFTER LABS / LFW   Vital Signs:  Patient profile:   60 year old male Weight:      225 pounds Temp:     98.3 degrees F oral Pulse rate:   76 / minute Pulse rhythm:   regular BP sitting:   118 / 72  (left arm) Cuff size:   large  Vitals Entered By: Sydell Axon LPN (June 12, 2009 3:58 PM) CC: Follow-up after labs   History of Present Illness: t here for 3 month followup of Dm, having changed from Avandia to Actos. He is doing well tolerating it but his sugar has gotten worse. He is noncompliant, knows he weighs too much and needs to be more careful with what he isd eating.  He was seen in early Jan for URI in my absence and still has minor lingering cough he fully recognizes is getting better.  Problems Prior to Update: 1)  Testicular Hypofunction  (ICD-257.2) 2)  Other Malaise and Fatigue  (ICD-780.79) 3)  Benign Prostatic Hypertrophy, With Urinary Obstruction  (ICD-600.01) 4)  Escherichia Coli Infection in Cce & Uns Site  (ICD-041.4) 5)  Sleep Apnea  (ICD-780.57) 6)  Mrsa,hx of  (ICD-041.12) 7)  Dyspepsia, Mild  (ICD-536.8) 8)  Coronary Atherosclerosis Native Coronary Artery  (ICD-414.01) 9)  Gerd,hx of  (ICD-530.81) 10)  Heart Murmur, Hx of  (ICD-V12.50) 11)  Chest Pain, Atypical, Hx of  (ICD-V15.89) 12)  Colonoscopy, Hx of  (ICD-V12.79) 13)  Arthroscopy, Knee, Hx of  (ICD-V45.89) 14)  Other Septicemia Due To Gram-negative Organism  (ICD-038.49) 15)  Screening For Malignannt Neoplasm, Site Nec  (ICD-V76.49) 16)  Anemia Due To Dietary Iron Deficiency  (ICD-280.1) 17)  Health Maintenance Exam  (ICD-V70.0) 18)  Spondylosis, Cervical/lumbar  (ICD-721.0) 19)  Fatty Liver Disease, Mild Via U/s  (ICD-571.8) 20)  Hyperuricemia, H/o Gouty Arthritis  (ICD-790.6) 21)  Diabetes Mellitus, Type II  (ICD-250.00) 22)  Hypercholesterolemia, Pure, Trig (219)  (ICD-272.0) 23)  Screening For Malignant Neoplasm, Prostate  (ICD-V76.44)  Medications Prior to Update: 1)   Glucophage 500 Mg Tabs (Metformin Hcl) .... Take 1 Tablet By Mouth Twice A Day 2)  Actos 45 Mg Tabs (Pioglitazone Hcl) .... One Tab By Mouth Once A Day. 3)  Altace 5 Mg Caps (Ramipril) .... Take 1 Capsule By Mouth At Night 4)  Lipitor 10 Mg Tabs (Atorvastatin Calcium) .... Take 1/2 Tablet By Mouth At Bedtime 5)  Probenecid 500 Mg Tabs (Probenecid) .... Take 1 Tablet By Mouth Twice A Day 6)  Glimepiride 2 Mg  Tabs (Glimepiride) .... One Tab By Mouth Two Times A Day 7)  Multivitamins   Tabs (Multiple Vitamin) .... Take One By Mouth Once A Day 8)  Bayer Aspirin Ec Low Dose 81 Mg Tbec (Aspirin) .[name].. 1 Daily By Mouth 9)  Flomax 0.4 Mg Caps (Tamsulosin Hcl) .... One Tab By Mouth Once A Day. 10)  Finasteride 5 Mg Tabs (Finasteride) .... One Tab By Mouth Once Daily.  Allergies: No Known Drug Allergies  Physical Exam  General:  Well-developed,well-nourished,in no acute distress; alert,appropriate and cooperative throughout examination, mildly obese. Head:  Normocephalic and atraumatic without obvious abnormalities. No apparent alopecia but slight  balding. Eyes:  Conjunctiva clear bilaterally.  Ears:  External ear exam shows no significant lesions or deformities.  Otoscopic examination reveals clear canals, tympanic membranes are intact bilaterally without bulging, retraction, inflammation or discharge. Hearing is grossly normal bilaterally. Nose:  External nasal examination shows no deformity or inflammation. Nasal  mucosa are pink and moist without lesions or exudates. Mouth:  Oral mucosa and oropharynx without lesions or exudates.  Teeth in good repair. Neck:  No deformities, masses, or tenderness noted. Chest Wall:  No deformities, masses, tenderness or gynecomastia noted. Lungs:  Normal respiratory effort, chest expands symmetrically. Lungs are clear to auscultation, no crackles or wheezes. Heart:  Normal rate and regular rhythm. S1 and S2 normal without gallop, murmur, click, rub or other extra  sounds. Abdomen:  Bowel sounds positive,abdomen soft and non-tender without masses, organomegaly or hernias noted. No suprapubic tenderness. Protuberant.   Impression & Recommendations:  Problem # 1:  DIABETES MELLITUS, TYPE II (ICD-250.00) Assessment Deteriorated  Has worsened...he needs to get on the stick, declines increase in meds. Will give 3 months to influence nos. His updated medication list for this problem includes:    Glucophage 500 Mg Tabs (Metformin hcl) .[name]... Take 1 tablet by mouth twice a day    Actos 45 Mg Tabs (Pioglitazone hcl) ..... One tab by mouth once a day.    Altace 5 Mg Caps (Ramipril) .[name]... Take 1 capsule by mouth at night    Glimepiride 2 Mg Tabs (Glimepiride) ..... One tab by mouth two times a day    Bayer Aspirin Ec Low Dose 81 Mg Tbec (Aspirin) .[name]... 1 daily by mouth  Labs Reviewed: Creat: 1.0 (01/31/2009)   Microalbumin: 2.1 (09/30/2005)  Last Eye Exam: normal (01/29/2009) Reviewed HgBA1c results: 7.1 (06/10/2009)  6.1 (01/31/2009)  Problem # 2:  TESTICULAR HYPOFUNCTION (ICD-257.2) Assessment: Unchanged Has seen Urology. Stopped Proscar, still on gerneric Flomax.  Complete Medication List: 1)  Glucophage 500 Mg Tabs (Metformin hcl) .... Take 1 tablet by mouth twice a day 2)  Actos 45 Mg Tabs (Pioglitazone hcl) .... One tab by mouth once a day. 3)  Altace 5 Mg Caps (Ramipril) .... Take 1 capsule by mouth at night 4)  Lipitor 10 Mg Tabs (Atorvastatin calcium) .... Take 1/2 tablet by mouth at bedtime 5)  Probenecid 500 Mg Tabs (Probenecid) .... Take 1 tablet by mouth twice a day 6)  Glimepiride 2 Mg Tabs (Glimepiride) .... One tab by mouth two times a day 7)  Multivitamins Tabs (Multiple vitamin) .... Take one by mouth once a day 8)  Bayer Aspirin Ec Low Dose 81 Mg Tbec (Aspirin) .[name].. 1 daily by mouth 9)  Flomax 0.4 Mg Caps (Tamsulosin hcl) .... One tab by mouth once a day.  Patient Instructions: 1)  RTC 3 mos, A1C prior 250.00  Current Allergies  (reviewed today): No known allergies

## 2010-05-20 NOTE — Letter (Signed)
Summary: Nadara Eaton letter  [facility]  [address] [place], [place] [address]   Phone: [phone]  Fax: [phone]       11/26/2009 MRN: [id number]  [facility] [address] [place], [place]  [address]  Dear Mr. MORE,  [facility] [place], and [facility] announce the retirement of Arta Silence, M.D., from full-time practice at the [facility] office effective October 17, 2009 and his plans of returning part-time.  It is important to Dr. Hetty Ely and to our practice that you understand that [facility] has seven physicians in our office for your health care needs.  We will continue to offer the same exceptional care that you have today.    Dr. Hetty Ely has spoken to many of you about his plans for retirement and returning part-time in the fall.   We will continue to work with you through the transition to schedule appointments for you in the office and meet the high standards that [facility] is committed to.   Again, it is with great pleasure that we share the news that Dr. Hetty Ely will return to [facility] at [facility] in October of 2011 with a reduced schedule.    If you have any questions, or would like to request an appointment with one of our physicians, please call us at [phone] and press the option for Scheduling an appointment.  We take pleasure in providing you with excellent patient care and look forward to seeing you at your next office visit.  Our [facility] Physicians are:  Tillman Abide, M.D. Laurita Quint, M.D. Roxy Manns, M.D. Kerby Nora, M.D. Hannah Beat, M.D. Ruthe Mannan, M.D. We proudly welcomed Raechel Ache, M.D. and Eustaquio Boyden, M.D. to the practice in July/August 2011.  Sincerely,  [facility]

## 2010-05-20 NOTE — Assessment & Plan Note (Signed)
Summary: SHARP PAINS IN KIDNEY/DLO   Vital Signs:  Patient profile:   60 year old male Weight:      222.12 pounds Temp:     98.4 degrees F oral Pulse rate:   76 / minute Pulse rhythm:   regular BP sitting:   122 / 80  (left arm) Cuff size:   large  Vitals Entered By: Sydell Axon LPN (July 29, 2009 3:52 PM) CC: Sharp pain in right kidney   History of Present Illness: Pt having "deep pain" in the right PSIS area which starts above and radiates down. It has been there over a week. He is having nml BMs, urinating ok w/o pain.  He has done nothing unusual around the time of this starting. He was working at the time . He does not recall a twisting motion.  Problems Prior to Update: 1)  Testicular Hypofunction  (ICD-257.2) 2)  Other Malaise and Fatigue  (ICD-780.79) 3)  Benign Prostatic Hypertrophy, With Urinary Obstruction  (ICD-600.01) 4)  Escherichia Coli Infection in Cce & Uns Site  (ICD-041.4) 5)  Sleep Apnea  (ICD-780.57) 6)  Mrsa,hx of  (ICD-041.12) 7)  Dyspepsia, Mild  (ICD-536.8) 8)  Coronary Atherosclerosis Native Coronary Artery  (ICD-414.01) 9)  Gerd,hx of  (ICD-530.81) 10)  Heart Murmur, Hx of  (ICD-V12.50) 11)  Chest Pain, Atypical, Hx of  (ICD-V15.89) 12)  Colonoscopy, Hx of  (ICD-V12.79) 13)  Arthroscopy, Knee, Hx of  (ICD-V45.89) 14)  Other Septicemia Due To Gram-negative Organism  (ICD-038.49) 15)  Screening For Malignannt Neoplasm, Site Nec  (ICD-V76.49) 16)  Anemia Due To Dietary Iron Deficiency  (ICD-280.1) 17)  Health Maintenance Exam  (ICD-V70.0) 18)  Spondylosis, Cervical/lumbar  (ICD-721.0) 19)  Fatty Liver Disease, Mild Via U/s  (ICD-571.8) 20)  Hyperuricemia, H/o Gouty Arthritis  (ICD-790.6) 21)  Diabetes Mellitus, Type II  (ICD-250.00) 22)  Hypercholesterolemia, Pure, Trig (219)  (ICD-272.0) 23)  Screening For Malignant Neoplasm, Prostate  (ICD-V76.44)  Medications Prior to Update: 1)  Glucophage 500 Mg Tabs (Metformin Hcl) .... Take 1 Tablet By  Mouth Twice A Day 2)  Actos 45 Mg Tabs (Pioglitazone Hcl) .... One Tab By Mouth Once A Day. 3)  Altace 5 Mg Caps (Ramipril) .... Take 1 Capsule By Mouth At Night 4)  Lipitor 10 Mg Tabs (Atorvastatin Calcium) .... Take 1/2 Tablet By Mouth At Bedtime 5)  Probenecid 500 Mg Tabs (Probenecid) .... Take 1 Tablet By Mouth Twice A Day 6)  Glimepiride 2 Mg  Tabs (Glimepiride) .... One Tab By Mouth Two Times A Day 7)  Multivitamins   Tabs (Multiple Vitamin) .... Take One By Mouth Once A Day 8)  Bayer Aspirin Ec Low Dose 81 Mg Tbec (Aspirin) .[name].. 1 Daily By Mouth 9)  Flomax 0.4 Mg Caps (Tamsulosin Hcl) .... One Tab By Mouth Once A Day.  Allergies: No Known Drug Allergies  Physical Exam  General:  Well-developed,well-nourished,in no acute distress; alert,appropriate and cooperative throughout examination, mildly obese. Head:  Normocephalic and atraumatic without obvious abnormalities. No apparent alopecia but slight  balding. Eyes:  Conjunctiva clear bilaterally.  Ears:  External ear exam shows no significant lesions or deformities.  Otoscopic examination reveals clear canals, tympanic membranes are intact bilaterally without bulging, retraction, inflammation or discharge. Hearing is grossly normal bilaterally. Nose:  External nasal examination shows no deformity or inflammation. Nasal mucosa are pink and moist without lesions or exudates. Mouth:  Oral mucosa and oropharynx without lesions or exudates.  Teeth in good repair. Msk:  Left PSIS area with grabbing pain bending over and left lateral bend, no prob with ext or lifting left leg.    Impression & Recommendations:  Problem # 1:  LUMBOSACRAL STRAIN, ACUTE (ICD-846.0) Assessment New  Discussed pathophysiology and trmt.  See instructions. U/A neg.  Orders: UA Dipstick W/ Micro (manual) (93235)  Complete Medication List: 1)  Glucophage 500 Mg Tabs (Metformin hcl) .... Take 1 tablet by mouth twice a day 2)  Actos 45 Mg Tabs (Pioglitazone  hcl) .... One tab by mouth once a day. 3)  Altace 5 Mg Caps (Ramipril) .... Take 1 capsule by mouth at night 4)  Lipitor 10 Mg Tabs (Atorvastatin calcium) .... Take 1/2 tablet by mouth at bedtime 5)  Probenecid 500 Mg Tabs (Probenecid) .... Take 1 tablet by mouth twice a day 6)  Glimepiride 2 Mg Tabs (Glimepiride) .... One tab by mouth two times a day 7)  Multivitamins Tabs (Multiple vitamin) .... Take one by mouth once a day 8)  Bayer Aspirin Ec Low Dose 81 Mg Tbec (Aspirin) .[name].. 1 daily by mouth 9)  Flomax 0.4 Mg Caps (Tamsulosin hcl) .... One tab by mouth once a day. 10)  Flexeril 10 Mg Tabs (Cyclobenzaprine hcl) .... One tab by mouth three times a day  Patient Instructions: 1)  Start Flexeril as directed. 2)  Take Aleve 2 tabs after brfst and supper for two weeks then got toTyl ES 2 tabs three times a day.  3)  se heat in the AM, ice after work.  Prescriptions: FLEXERIL 10 MG TABS (CYCLOBENZAPRINE HCL) one tab by mouth three times a day  #50 x 1   Entered and Authorized by:   Shaune Leeks MD   Signed by:   Shaune Leeks MD on 07/29/2009   Method used:   Electronically to        [facility] [id number] (retail)       [address]       [place], [place]  [address]       Ph: [phone] or [phone]       Fax: [phone]   RxID:   [id number]   Current Allergies (reviewed today): No known allergies   Laboratory Results   Urine Tests  Date/Time Received: July 29, 2009 4:01 PM  Date/Time Reported: July 29, 2009 4:01 PM   Routine Urinalysis   Color: yellow Appearance: Hazy Glucose: negative   (Normal Range: Negative) Bilirubin: negative   (Normal Range: Negative) Ketone: negative   (Normal Range: Negative) Spec. Gravity: 1.020   (Normal Range: 1.003-1.035) Blood: negative   (Normal Range: Negative) pH: 6.0   (Normal Range: 5.0-8.0) Protein: trace   (Normal Range: Negative) Urobilinogen: 0.2   (Normal Range: 0-1) Nitrite: negative    (Normal Range: Negative) Leukocyte Esterace: negative   (Normal Range: Negative)  Urine Microscopic WBC/HPF: 0-1 RBC/HPF: 0-1 Bacteria/HPF: 0 Epithelial/HPF: Rare

## 2010-05-20 NOTE — Assessment & Plan Note (Signed)
Summary: f/u per dr schaller/dlo   Vital Signs:  Patient profile:   60 year old male Weight:      223 pounds BMI:     35.05 Temp:     98.5 degrees F oral Pulse rate:   84 / minute Pulse rhythm:   regular BP sitting:   114 / 76  (left arm) Cuff size:   large  Vitals Entered By: Sydell Axon LPN (January 30, 2010 2:26 PM) CC: follow-up visit   History of Present Illness: Pt here in followup for diabetes, having increased Metformin to 100mg  two times a day and Glimepiride 4mg  two times a day. He decreased the Glimepiride to 2mg  in AM due to feelings of low sugare during the morning, which resolved after that adjustment but his weight has not changed and his AM nos continue 130-140s. He is not active...has left knee pain. He otherwise feels fine with no complaint.  Problems Prior to Update: 1)  Lumbosacral Strain, Acute  (ICD-846.0) 2)  Strep Throat  (ICD-034.0) 3)  Testicular Hypofunction  (ICD-257.2) 4)  Other Malaise and Fatigue  (ICD-780.79) 5)  Benign Prostatic Hypertrophy, With Urinary Obstruction  (ICD-600.01) 6)  Escherichia Coli Infection in Cce & Uns Site  (ICD-041.4) 7)  Sleep Apnea  (ICD-780.57) 8)  Mrsa,hx of  (ICD-041.12) 9)  Dyspepsia, Mild  (ICD-536.8) 10)  Coronary Atherosclerosis Native Coronary Artery  (ICD-414.01) 11)  Gerd,hx of  (ICD-530.81) 12)  Heart Murmur, Hx of  (ICD-V12.50) 13)  Chest Pain, Atypical, Hx of  (ICD-V15.89) 14)  Colonoscopy, Hx of  (ICD-V12.79) 15)  Arthroscopy, Knee, Hx of  (ICD-V45.89) 16)  Other Septicemia Due To Gram-negative Organism  (ICD-038.49) 17)  Screening For Malignannt Neoplasm, Site Nec  (ICD-V76.49) 18)  Anemia Due To Dietary Iron Deficiency  (ICD-280.1) 19)  Health Maintenance Exam  (ICD-V70.0) 20)  Spondylosis, Cervical/lumbar  (ICD-721.0) 21)  Fatty Liver Disease, Mild Via U/s  (ICD-571.8) 22)  Hyperuricemia, H/o Gouty Arthritis  (ICD-790.6) 23)  Diabetes Mellitus, Type II  (ICD-250.00) 24)  Hypercholesterolemia, Pure,  Trig (219)  (ICD-272.0) 25)  Screening For Malignant Neoplasm, Prostate  (ICD-V76.44)  Medications Prior to Update: 1)  Metformin Hcl 1000 Mg Tabs (Metformin Hcl) .... One Tab By Mouth Two Times A Day 2)  Actos 45 Mg Tabs (Pioglitazone Hcl) .... One Tab By Mouth Once A Day. 3)  Altace 5 Mg Caps (Ramipril) .... Take 1 Capsule By Mouth At Night 4)  Lipitor 10 Mg Tabs (Atorvastatin Calcium) .... Take 1/2 Tablet By Mouth At Bedtime 5)  Probenecid 500 Mg Tabs (Probenecid) .... Take 1 Tablet By Mouth Twice A Day 6)  Glimepiride 4 Mg Tabs (Glimepiride) .... One Tab By Mouth Two Times A Day 7)  Multivitamins   Tabs (Multiple Vitamin) .... Take One By Mouth Once A Day 8)  Bayer Aspirin Ec Low Dose 81 Mg Tbec (Aspirin) .[name].. 1 Daily By Mouth 9)  Flomax 0.4 Mg Caps (Tamsulosin Hcl) .... One Tab By Mouth Once A Day. 10)  Flexeril 10 Mg Tabs (Cyclobenzaprine Hcl) .... One Tab By Mouth Three Times A Day  Allergies: No Known Drug Allergies  Physical Exam  General:  Well-developed,well-nourished,in no acute distress; alert,appropriate and cooperative throughout examination, mildly obese. Head:  Normocephalic and atraumatic without obvious abnormalities. No apparent alopecia but slight  balding. Eyes:  Conjunctiva clear bilaterally.  Ears:  External ear exam shows no significant lesions or deformities.  Otoscopic examination reveals clear canals, tympanic membranes are intact bilaterally without bulging,  retraction, inflammation or discharge. Hearing is grossly normal bilaterally. Nose:  External nasal examination shows no deformity or inflammation. Nasal mucosa are pink and moist without lesions or exudates. Mouth:  Oral mucosa and oropharynx without lesions or exudates.  Teeth in good repair. Neck:  No deformities, masses, or tenderness noted. Chest Wall:  No deformities, masses, tenderness or gynecomastia noted. Lungs:  Normal respiratory effort, chest expands symmetrically. Lungs are clear to  auscultation, no crackles or wheezes. Heart:  Normal rate and regular rhythm. S1 and S2 normal without gallop, murmur, click, rub or other extra sounds. Abdomen:  Bowel sounds positive,abdomen soft and non-tender without masses, organomegaly or hernias noted. No suprapubic tenderness. Protuberant.   Impression & Recommendations:  Problem # 1:  DIABETES MELLITUS, TYPE II (ICD-250.00) Assessment Unchanged Adjusted meds and he readjusted with no improvement. He needs to exercise and watch ndiet, neither is he doing. He needs to try biking (stationary or otherwise) and or elliptical to burn sugar. Would continue meds at current level. His updated medication list for this problem includes:    Metformin Hcl 1000 Mg Tabs (Metformin hcl) ..... One tab by mouth two times a day    Actos 45 Mg Tabs (Pioglitazone hcl) ..... One tab by mouth once a day.    Altace 5 Mg Caps (Ramipril) .[name]... Take 1 capsule by mouth at night    Glimepiride 4 Mg Tabs (Glimepiride) ..... One tab by mouth two times a day    Bayer Aspirin Ec Low Dose 81 Mg Tbec (Aspirin) .[name]... 1 daily by mouth  Labs Reviewed: Creat: 1.0 (01/31/2009)   Microalbumin: 2.1 (09/30/2005)  Last Eye Exam: normal (01/29/2009) Reviewed HgBA1c results: 7.1 (01/23/2010)  7.4 (09/10/2009)  Complete Medication List: 1)  Metformin Hcl 1000 Mg Tabs (Metformin hcl) .... One tab by mouth two times a day 2)  Actos 45 Mg Tabs (Pioglitazone hcl) .... One tab by mouth once a day. 3)  Altace 5 Mg Caps (Ramipril) .... Take 1 capsule by mouth at night 4)  Lipitor 10 Mg Tabs (Atorvastatin calcium) .... Take 1/2 tablet by mouth at bedtime 5)  Probenecid 500 Mg Tabs (Probenecid) .... Take 1 tablet by mouth twice a day 6)  Glimepiride 4 Mg Tabs (Glimepiride) .... One tab by mouth two times a day 7)  Multivitamins Tabs (Multiple vitamin) .... Take one by mouth once a day 8)  Bayer Aspirin Ec Low Dose 81 Mg Tbec (Aspirin) .[name].. 1 daily by mouth 9)  Flomax 0.4 Mg Caps  (Tamsulosin hcl) .... One tab by mouth once a day. 10)  Flexeril 10 Mg Tabs (Cyclobenzaprine hcl) .... One tab by mouth three times a day  Patient Instructions: 1)  RTC as scheduled for Comp Exam in Jan, labs prior.  Current Allergies (reviewed today): No known allergies

## 2010-05-20 NOTE — Consult Note (Signed)
Summary: Dr[facility],Note  Dr.[facility],Note   Imported By: Beau Fanny 04/23/2009 14:52:01  _____________________________________________________________________  External Attachment:    Type:   Image     Comment:   External Document

## 2010-05-20 NOTE — Assessment & Plan Note (Signed)
Summary: ROA 3 MTHS CYD   Vital Signs:  Patient profile:   60 year old male Weight:      222.25 pounds Temp:     98.4 degrees F oral Pulse rate:   84 / minute Pulse rhythm:   regular BP sitting:   118 / 76  (left arm) Cuff size:   large  Vitals Entered By: Sydell Axon LPN (Sep 12, 2009 3:27 PM) CC: 3 Month follow-up after labs   History of Present Illness: Pt here for three month followup after pt refused to increase meds last time as he felt he could get his sugar under control by diet and exercise. His A1C has gone up. His weight has indeed gone down 2 pounds from 3 months ago. He feels well but knows that he needs to do something. His AM nos typically are 140-165 and PM nos typically 140-190s.  Problems Prior to Update: 1)  Lumbosacral Strain, Acute  (ICD-846.0) 2)  Strep Throat  (ICD-034.0) 3)  Testicular Hypofunction  (ICD-257.2) 4)  Other Malaise and Fatigue  (ICD-780.79) 5)  Benign Prostatic Hypertrophy, With Urinary Obstruction  (ICD-600.01) 6)  Escherichia Coli Infection in Cce & Uns Site  (ICD-041.4) 7)  Sleep Apnea  (ICD-780.57) 8)  Mrsa,hx of  (ICD-041.12) 9)  Dyspepsia, Mild  (ICD-536.8) 10)  Coronary Atherosclerosis Native Coronary Artery  (ICD-414.01) 11)  Gerd,hx of  (ICD-530.81) 12)  Heart Murmur, Hx of  (ICD-V12.50) 13)  Chest Pain, Atypical, Hx of  (ICD-V15.89) 14)  Colonoscopy, Hx of  (ICD-V12.79) 15)  Arthroscopy, Knee, Hx of  (ICD-V45.89) 16)  Other Septicemia Due To Gram-negative Organism  (ICD-038.49) 17)  Screening For Malignannt Neoplasm, Site Nec  (ICD-V76.49) 18)  Anemia Due To Dietary Iron Deficiency  (ICD-280.1) 19)  Health Maintenance Exam  (ICD-V70.0) 20)  Spondylosis, Cervical/lumbar  (ICD-721.0) 21)  Fatty Liver Disease, Mild Via U/s  (ICD-571.8) 22)  Hyperuricemia, H/o Gouty Arthritis  (ICD-790.6) 23)  Diabetes Mellitus, Type II  (ICD-250.00) 24)  Hypercholesterolemia, Pure, Trig (219)  (ICD-272.0) 25)  Screening For Malignant Neoplasm,  Prostate  (ICD-V76.44)  Medications Prior to Update: 1)  Glucophage 500 Mg Tabs (Metformin Hcl) .... Take 1 Tablet By Mouth Twice A Day 2)  Actos 45 Mg Tabs (Pioglitazone Hcl) .... One Tab By Mouth Once A Day. 3)  Altace 5 Mg Caps (Ramipril) .... Take 1 Capsule By Mouth At Night 4)  Lipitor 10 Mg Tabs (Atorvastatin Calcium) .... Take 1/2 Tablet By Mouth At Bedtime 5)  Probenecid 500 Mg Tabs (Probenecid) .... Take 1 Tablet By Mouth Twice A Day 6)  Glimepiride 2 Mg  Tabs (Glimepiride) .... One Tab By Mouth Two Times A Day 7)  Multivitamins   Tabs (Multiple Vitamin) .... Take One By Mouth Once A Day 8)  Bayer Aspirin Ec Low Dose 81 Mg Tbec (Aspirin) .[name].. 1 Daily By Mouth 9)  Flomax 0.4 Mg Caps (Tamsulosin Hcl) .... One Tab By Mouth Once A Day. 10)  Flexeril 10 Mg Tabs (Cyclobenzaprine Hcl) .... One Tab By Mouth Three Times A Day  Allergies: No Known Drug Allergies  Physical Exam  General:  Well-developed,well-nourished,in no acute distress; alert,appropriate and cooperative throughout examination, mildly obese. Head:  Normocephalic and atraumatic without obvious abnormalities. No apparent alopecia but slight  balding. Eyes:  Conjunctiva clear bilaterally.  Ears:  External ear exam shows no significant lesions or deformities.  Otoscopic examination reveals clear canals, tympanic membranes are intact bilaterally without bulging, retraction, inflammation or discharge. Hearing is  grossly normal bilaterally. Nose:  External nasal examination shows no deformity or inflammation. Nasal mucosa are pink and moist without lesions or exudates. Mouth:  Oral mucosa and oropharynx without lesions or exudates.  Teeth in good repair. Neck:  No deformities, masses, or tenderness noted. Chest Wall:  No deformities, masses, tenderness or gynecomastia noted. Lungs:  Normal respiratory effort, chest expands symmetrically. Lungs are clear to auscultation, no crackles or wheezes. Heart:  Normal rate and regular  rhythm. S1 and S2 normal without gallop, murmur, click, rub or other extra sounds. Abdomen:  Bowel sounds positive,abdomen soft and non-tender without masses, organomegaly or hernias noted. No suprapubic tenderness. Protuberant.   Impression & Recommendations:  Problem # 1:  DIABETES MELLITUS, TYPE II (ICD-250.00) Assessment Unchanged  Nos too high. Incr Glimepiride and Metformin  per instructions. His updated medication list for this problem includes:    Metformin Hcl 1000 Mg Tabs (Metformin hcl) ..... One tab by mouth two times a day    Actos 45 Mg Tabs (Pioglitazone hcl) ..... One tab by mouth once a day.    Altace 5 Mg Caps (Ramipril) .[name]... Take 1 capsule by mouth at night    Glimepiride 4 Mg Tabs (Glimepiride) ..... One tab by mouth two times a day    Bayer Aspirin Ec Low Dose 81 Mg Tbec (Aspirin) .[name]... 1 daily by mouth  Labs Reviewed: Creat: 1.0 (01/31/2009)   Microalbumin: 2.1 (09/30/2005)  Last Eye Exam: normal (01/29/2009) Reviewed HgBA1c results: 7.4 (09/10/2009)  7.1 (06/10/2009)  Complete Medication List: 1)  Metformin Hcl 1000 Mg Tabs (Metformin hcl) .... One tab by mouth two times a day 2)  Actos 45 Mg Tabs (Pioglitazone hcl) .... One tab by mouth once a day. 3)  Altace 5 Mg Caps (Ramipril) .... Take 1 capsule by mouth at night 4)  Lipitor 10 Mg Tabs (Atorvastatin calcium) .... Take 1/2 tablet by mouth at bedtime 5)  Probenecid 500 Mg Tabs (Probenecid) .... Take 1 tablet by mouth twice a day 6)  Glimepiride 4 Mg Tabs (Glimepiride) .... One tab by mouth two times a day 7)  Multivitamins Tabs (Multiple vitamin) .... Take one by mouth once a day 8)  Bayer Aspirin Ec Low Dose 81 Mg Tbec (Aspirin) .[name].. 1 daily by mouth 9)  Flomax 0.4 Mg Caps (Tamsulosin hcl) .... One tab by mouth once a day. 10)  Flexeril 10 Mg Tabs (Cyclobenzaprine hcl) .... One tab by mouth three times a day  Patient Instructions: 1)  Incr Glimepiride to 4mg  two times a day  2)  Incr Metformin to  1000mg  two times a day. 3)  In 6 weeks, if AM no. > 140, incr PM Metformin to 1500. 4)  In 6 weeks,  if PM no. >140, incr AM Metformin to 1500. 5)  Watch diet and exercise. 6)  Make appt in 2 weeks to see me first of Oct, A1C prior. Prescriptions: GLIMEPIRIDE 4 MG TABS (GLIMEPIRIDE) one tab by mouth two times a day  #180 x 3   Entered and Authorized by:   Shaune Leeks MD   Signed by:   Shaune Leeks MD on 09/12/2009   Method used:   Electronically to        [facility]  [address] (retail)       [address]       [place], [place]  [address]       Ph: [phone] or [phone]       Fax: [phone]  RxID:   [id number] METFORMIN HCL 1000 MG TABS (METFORMIN HCL) one tab by mouth two times a day  #180 x 3   Entered and Authorized by:   Shaune Leeks MD   Signed by:   Shaune Leeks MD on 09/12/2009   Method used:   [facility]ron[facility] to        [facility]  [address] (retail)       [address]       [place], [place]  [address]       Ph: [phone] or [phone]       Fax: [phone]   RxID:   [id number]   Current Allergies (reviewed today): No known allergies

## 2010-05-22 NOTE — Assessment & Plan Note (Signed)
Summary: cpx/dlo   Vital Signs:  Patient profile:   60 year old male Weight:      223 pounds Temp:     97.4 degrees F oral Pulse rate:   80 / minute Pulse rhythm:   regular BP sitting:   110 / 68  (left arm) Cuff size:   large  Vitals Entered By: Sydell Axon LPN (April 30, 2010 2:58 PM) CC: 30 Minute checkup   History of Present Illness: Pt here for Comp Exam. Most of the time he feels fine but would like referral to Cardiology forn ETT. He has some strange feelings in his chest. His sugar has been fluctuating. Discussed. He deer hunts a lot, every chance he gets. This year he had SOB walking out to the stand and back.   Preventive Screening-Counseling & Management  Alcohol-Tobacco     Alcohol drinks/day: <1     Alcohol type: beer     Smoking Status: quit     Year Quit: 1971     Pack years: 5     Passive Smoke Exposure: yes  Caffeine-Diet-Exercise     Caffeine use/day: occas     Does Patient Exercise: no     Type of exercise: walking at work.  Problems Prior to Update: 1)  Lumbosacral Strain, Acute  (ICD-846.0) 2)  Strep Throat  (ICD-034.0) 3)  Testicular Hypofunction  (ICD-257.2) 4)  Other Malaise and Fatigue  (ICD-780.79) 5)  Benign Prostatic Hypertrophy, With Urinary Obstruction  (ICD-600.01) 6)  Escherichia Coli Infection in Cce & Uns Site  (ICD-041.4) 7)  Sleep Apnea  (ICD-780.57) 8)  Mrsa,hx of  (ICD-041.12) 9)  Dyspepsia, Mild  (ICD-536.8) 10)  Coronary Atherosclerosis Native Coronary Artery  (ICD-414.01) 11)  Gerd,hx of  (ICD-530.81) 12)  Heart Murmur, Hx of  (ICD-V12.50) 13)  Chest Pain, Atypical, Hx of  (ICD-V15.89) 14)  Colonoscopy, Hx of  (ICD-V12.79) 15)  Arthroscopy, Knee, Hx of  (ICD-V45.89) 16)  Other Septicemia Due To Gram-negative Organism  (ICD-038.49) 17)  Screening For Malignannt Neoplasm, Site Nec  (ICD-V76.49) 18)  Anemia Due To Dietary Iron Deficiency  (ICD-280.1) 19)  Health Maintenance Exam  (ICD-V70.0) 20)  Spondylosis,  Cervical/lumbar  (ICD-721.0) 21)  Fatty Liver Disease, Mild Via U/s  (ICD-571.8) 22)  Hyperuricemia, H/o Gouty Arthritis  (ICD-790.6) 23)  Diabetes Mellitus, Type II  (ICD-250.00) 24)  Hypercholesterolemia, Pure, Trig (219)  (ICD-272.0) 25)  Screening For Malignant Neoplasm, Prostate  (ICD-V76.44)  Medications Prior to Update: 1)  Metformin Hcl 1000 Mg Tabs (Metformin Hcl) .... One Tab By Mouth Two Times A Day 2)  Actos 45 Mg Tabs (Pioglitazone Hcl) .... One Tab By Mouth Once A Day. 3)  Altace 5 Mg Caps (Ramipril) .... Take 1 Capsule By Mouth At Night 4)  Lipitor 10 Mg Tabs (Atorvastatin Calcium) .... Take 1/2 Tablet By Mouth At Bedtime 5)  Probenecid 500 Mg Tabs (Probenecid) .... Take 1 Tablet By Mouth Twice A Day 6)  Glimepiride 4 Mg Tabs (Glimepiride) .... One Tab By Mouth Two Times A Day 7)  Multivitamins   Tabs (Multiple Vitamin) .... Take One By Mouth Once A Day 8)  Bayer Aspirin Ec Low Dose 81 Mg Tbec (Aspirin) .[name].. 1 Daily By Mouth 9)  Flomax 0.4 Mg Caps (Tamsulosin Hcl) .... One Tab By Mouth Once A Day. 10)  Flexeril 10 Mg Tabs (Cyclobenzaprine Hcl) .... One Tab By Mouth Three Times A Day  Current Medications (verified): 1)  Metformin Hcl 1000 Mg Tabs (Metformin Hcl) .Marland KitchenMarland KitchenMarland Kitchen  One Tab By Mouth Two Times A Day 2)  Actos 45 Mg Tabs (Pioglitazone Hcl) .... One Tab By Mouth Once A Day. 3)  Altace 5 Mg Caps (Ramipril) .... Take 1 Capsule By Mouth At Night 4)  Lipitor 10 Mg Tabs (Atorvastatin Calcium) .... Take 1/2 Tablet By Mouth At Bedtime 5)  Probenecid 500 Mg Tabs (Probenecid) .... Take 1 Tablet By Mouth Twice A Day 6)  Glimepiride 4 Mg Tabs (Glimepiride) .... One Tab By Mouth Two Times A Day 7)  Multivitamins   Tabs (Multiple Vitamin) .... Take One By Mouth Once A Day 8)  Bayer Aspirin Ec Low Dose 81 Mg Tbec (Aspirin) .[name].. 1 Daily By Mouth 9)  Flomax 0.4 Mg Caps (Tamsulosin Hcl) .... One Tab By Mouth Once A Day. 10)  Flexeril 10 Mg Tabs (Cyclobenzaprine Hcl) .... One Tab By Mouth  Three Times A Day  Allergies: No Known Drug Allergies  Past History:  Past Medical History: Last updated: 12/15/2008 Current Problems:  ESCHERICHIA COLI INFECTION IN CCE & UNS SITE (ICD-041.4) SLEEP APNEA (ICD-780.57) MRSA,HX OF (ICD-041.12) DYSPEPSIA, MILD (ICD-536.8) CORONARY ATHEROSCLEROSIS NATIVE CORONARY ARTERY (ICD-414.01) DIZZINESS,HX OF (ICD-780.4) GERD,HX OF  HEART MURMUR, HX OF (ICD-V12.50) DYSPNEA,HX OF (ICD-786.05) CHEST PAIN, ATYPICAL, HX OF (ICD-V15.89) COLONOSCOPY, HX OF (ICD-V12.79) OTHER SEPTICEMIA DUE TO GRAM-NEGATIVE ORGANISM (ICD-038.49) UTI (ICD-599.0) HEADACHE (ICD-784.0) SCREENING FOR MALIGNANNT NEOPLASM, SITE NEC (ICD-V76.49) ANEMIA DUE TO DIETARY IRON DEFICIENCY (ICD-280.1) HEALTH MAINTENANCE EXAM (ICD-V70.0) SPONDYLOSIS, CERVICAL/LUMBAR (ICD-721.0) FATTY LIVER DISEASE, MILD VIA U/S (ICD-571.8) HYPERURICEMIA, H/O GOUTY ARTHRITIS (ICD-790.6) DIABETES MELLITUS, TYPE II (ICD-250.00) HYPERCHOLESTEROLEMIA, PURE, TRIG (219) (ICD-272.0) SCREENING FOR MALIGNANT NEOPLASM, PROSTATE (ICD-V76.44)  Past Surgical History: Last updated: 02/04/2009 ARTHROSCOPY, KNEE, HX OF (ICD-V45.89) Stress Cardiolite wnl EF 64% 09/27/98 Abd U/S--Fatty liver 03/30/00 CATH, min Dz, EF 65%, Adm.R/O'd   12/12/01 Colonoscopy wnl 12/06/02 Right knee surg, Dr. August Saucer, med meniscus tear via MRI 05/21/03 Myelogram, bulging  disc 08/05 MRI Brain 09/06 HOSP Urosepsis 1/1 -1/5 2010  Family History: Last updated: 05-08-2010 Father: Died at age 35 of heart attack with coronary disease, hypertension, congestive heart failure Mother: dec (2006) CHF Siblings: Two brothers,  one A 50  has severe headaches from arsenic in the past from               wood that was treated on his deck                                      other A 56 Sister A 17 Sister A 60  Social History: Last updated: May 08, 2010 Marital Status: Married Children: 2 children Occupation: Maintenance RF Presenter, broadcasting Tobacco Use - Former.   Risk Factors: Alcohol Use: <1 (08-May-2010) Caffeine Use: occas (08-May-2010) Exercise: no (05/08/2010)  Risk Factors: Smoking Status: quit (05-08-2010) Passive Smoke Exposure: yes (05/08/10)  Family History: Father: Died at age 20 of heart attack with coronary disease, hypertension, congestive heart failure Mother: dec (2006) CHF Siblings: Two brothers,  one A 50  has severe headaches from arsenic in the past from               wood that was treated on his deck                                      other A 26 Sister A 56 Sister  A 60  Social History: Marital Status: Married Children: 2 children Occupation: Nutritional therapist Tobacco Use - Former.   Review of Systems General:  Complains of fatigue; denies chills, fever, sweats, weakness, and weight loss; occas. Eyes:  Denies blurring, discharge, and eye pain. ENT:  Denies decreased hearing, earache, and ringing in ears. CV:  Complains of palpitations, shortness of breath with exertion, and weight gain; denies chest pain or discomfort, difficulty breathing at night, fainting, fatigue, swelling of feet, and swelling of hands. Resp:  Denies cough and shortness of breath. GI:  Denies abdominal pain, bloody stools, change in bowel habits, constipation, dark tarry stools, diarrhea, indigestion, loss of appetite, nausea, vomiting, vomiting blood, and yellowish skin color. GU:  Denies discharge, dysuria, incontinence, nocturia, and urinary frequency. MS:  Complains of cramps; denies low back pain, muscle aches, and stiffness; occas of legs. Derm:  Denies dryness, itching, and rash. Neuro:  Denies numbness, poor balance, tingling, and tremors.  Physical Exam  General:  Well-developed,well-nourished,in no acute distress; alert,appropriate and cooperative throughout examination, mildly obese. Head:  Normocephalic and atraumatic without obvious abnormalities. No apparent alopecia but slight   balding. Eyes:  Conjunctiva clear bilaterally.  Ears:  External ear exam shows no significant lesions or deformities.  Otoscopic examination reveals clear canals, tympanic membranes are intact bilaterally without bulging, retraction, inflammation or discharge. Hearing is grossly normal bilaterally. Nose:  External nasal examination shows no deformity or inflammation. Nasal mucosa are pink and moist without lesions or exudates. Mouth:  Oral mucosa and oropharynx without lesions or exudates.  Teeth in good repair. Neck:  No deformities, masses, or tenderness noted. Chest Wall:  No deformities, masses, tenderness or gynecomastia noted. Breasts:  No masses or gynecomastia noted Lungs:  Normal respiratory effort, chest expands symmetrically. Lungs are clear to auscultation, no crackles or wheezes. Heart:  Normal rate and regular rhythm. S1 and S2 normal without gallop, murmur, click, rub or other extra sounds. Abdomen:  Bowel sounds positive,abdomen soft and non-tender without masses, organomegaly or hernias noted. No suprapubic tenderness. More protuberant. Rectal:  No external abnormalities noted. Normal sphincter tone. No rectal masses or tenderness. G neg. Genitalia:  Testes bilaterally descended without nodularity, tenderness or masses. No scrotal masses or lesions. No penis lesions or urethral discharge.  Prostate:  Prostate gland firm and smooth, no enlargement, nodularity, tenderness, mass, asymmetry or induration. 20gms. Msk:  Left PSIS area with grabbing pain bending over and left lateral bend, no prob with ext or lifting left leg.  Pulses:  R and L carotid,radial,femoral,dorsalis pedis and posterior tibial pulses are full and equal bilaterally Extremities:  No clubbing, cyanosis, edema, or deformity noted with normal full range of motion of all joints.   Neurologic:  No cranial nerve deficits noted. Station and gait are normal. Sensory, motor and coordinative functions appear intact. Skin:   Intact without suspicious lesions or rashes Cervical Nodes:  No lymphadenopathy noted Inguinal Nodes:  No significant adenopathy Psych:  normally interactive, good eye contact, not anxious appearing, and not depressed appearing.    Diabetes Management Exam:    Foot Exam (with socks and/or shoes not present):       Sensory-Pinprick/Light touch:          Left medial foot (L-4): normal          Left dorsal foot (L-5): normal          Left lateral foot (S-1): normal          Right medial foot (  L-4): normal          Right dorsal foot (L-5): normal          Right lateral foot (S-1): normal       Sensory-Monofilament:          Left foot: normal          Right foot: normal       Inspection:          Left foot: normal          Right foot: normal       Nails:          Left foot: normal          Right foot: normal   Impression & Recommendations:  Problem # 1:  HEALTH MAINTENANCE EXAM (ICD-V70.0) Assessment Comment Only  Reviewed preventive care protocols, scheduled due services, and updated immunizations.  Problem # 2:  PALPITATIONS, OCCASIONAL (ICD-785.1) Assessment: New Does not sound alarming but will have pt see Cardipology due to risk factors. Orders: Cardiology Referral (Cardiology)  Problem # 3:  BENIGN PROSTATIC HYPERTROPHY, WITH URINARY OBSTRUCTION (ICD-600.01) Assessment: Unchanged Stable.  Problem # 4:  FATTY LIVER DISEASE, MILD VIA U/S (ICD-571.8) Assessment: Unchanged LFTs nml this year.  Problem # 5:  HYPERURICEMIA, H/O GOUTY ARTHRITIS (ICD-790.6) Assessment: Improved Good control. Cont meds.  Problem # 6:  DIABETES MELLITUS, TYPE II (ICD-250.00) Assessment: Unchanged  Unchanged to slightly deteriorated. Is on Actos and the current environment requires stopping that medication. Really needs to get hot on diet control and weight loss. Needs to avoid Bread, potatoes, pasta and rice..."all the good things" and start exercise regularly. Recheck 3 mos. Weight loss  imperative. His updated medication list for this problem includes:    Metformin Hcl 1000 Mg Tabs (Metformin hcl) ..... One tab by mouth two times a day    Actos 45 Mg Tabs (Pioglitazone hcl) ..... One tab by mouth once a day.    Altace 5 Mg Caps (Ramipril) .[name]... Take 1 capsule by mouth at night    Glimepiride 4 Mg Tabs (Glimepiride) ..... One tab by mouth two times a day    Bayer Aspirin Ec Low Dose 81 Mg Tbec (Aspirin) .[name]... 1 daily by mouth  Labs Reviewed: Creat: 1.0 (04/23/2010)   Microalbumin: 2.1 (09/30/2005)  Last Eye Exam: normal (02/12/2010) Reviewed HgBA1c results: 7.2 (04/23/2010)  7.1 (01/23/2010)  Problem # 7:  HYPERCHOLESTEROLEMIA, PURE, TRIG (219) (ICD-272.0) Assessment: Unchanged Stable, HDL low. Do not feel compelled to treat in face of LDL. His updated medication list for this problem includes:    Lipitor 10 Mg Tabs (Atorvastatin calcium) .[name]... Take 1/2 tablet by mouth at bedtime  Labs Reviewed: SGOT: 32 (04/23/2010)   SGPT: 33 (04/23/2010)   HDL:32.60 (04/23/2010), 31.80 (01/31/2009)  LDL:87 (04/23/2010), 74 (01/31/2009)  Chol:126 (04/23/2010), 117 (01/31/2009)  Trig:34.0 (04/23/2010), 55.0 (01/31/2009)  Problem # 8:  ANEMIA DUE TO DIETARY IRON DEFICIENCY (ICD-280.1) Assessment: Deteriorated Needs iron replacement for life.  Problem # 9:  SCREENING FOR MALIGNANT NEOPLASM, PROSTATE (ICD-V76.44) Assessment: Unchanged Stable PSA and exam.  Complete Medication List: 1)  Metformin Hcl 1000 Mg Tabs (Metformin hcl) .... One tab by mouth two times a day 2)  Actos 45 Mg Tabs (Pioglitazone hcl) .... One tab by mouth once a day. 3)  Altace 5 Mg Caps (Ramipril) .... Take 1 capsule by mouth at night 4)  Lipitor 10 Mg Tabs (Atorvastatin calcium) .... Take 1/2 tablet by mouth at bedtime 5)  Probenecid 500 Mg  Tabs (Probenecid) .... Take 1 tablet by mouth twice a day 6)  Glimepiride 4 Mg Tabs (Glimepiride) .... One tab by mouth two times a day 7)  Multivitamins Tabs (Multiple  vitamin) .... Take one by mouth once a day 8)  Bayer Aspirin Ec Low Dose 81 Mg Tbec (Aspirin) .[name].. 1 daily by mouth 9)  Flomax 0.4 Mg Caps (Tamsulosin hcl) .... One tab by mouth once a day. 10)  Flexeril 10 Mg Tabs (Cyclobenzaprine hcl) .... One tab by mouth three times a day  Patient Instructions: 1)  RTC 3 mos with sugar diary. Prescriptions: FLOMAX 0.4 MG CAPS (TAMSULOSIN HCL) one tab by mouth once a day.  #90 x 4   Entered and Authorized by:   Shaune Leeks MD   Signed by:   Shaune Leeks MD on 04/30/2010   Method used:   Faxed to ...       [facility] (mail-order)             , [place]         Ph: [phone]       Fax: [phone]   RxID:   [id number] GLIMEPIRIDE 4 MG TABS (GLIMEPIRIDE) one tab by mouth two times a day  #180 x 3   Entered and Authorized by:   Shaune Leeks MD   Signed by:   Shaune Leeks MD on 04/30/2010   Method used:   Faxed to ...       [facility] (mail-[facility])             , [place]         Ph: [phone]       Fax: [phone]   RxID:   [id number] PROBENECID 500 MG TABS (PROBENECID) Take 1 tablet by mouth twice a day  #180 x 4   Entered and Authorized by:   Shaune Leeks MD   Signed by:   Shaune Leeks MD on 04/30/2010   Method used:   Faxed to ...       [facility] (mail-order)             , [place]         Ph: [phone]       Fax: [phone]   RxID:   [id number] LIPITOR 10 MG TABS (ATORVASTATIN CALCIUM) Take 1/2 tablet by mouth at bedtime  #90 x 4   Entered and Authorized by:   Shaune Leeks MD   Signed by:   Shaune Leeks MD on 04/30/2010   Method used:   Faxed to ...       [facility] (mail-[facility])             , [place]         Ph: [phone]       Fax: [phone]   RxID:   [id number] ALTACE 5 MG CAPS (RAMIPRIL) Take 1 capsule by mouth at night  #90 x 4   Entered and Authorized by:   Shaune Leeks MD   Signed by:   Shaune Leeks MD on 04/30/2010   Method used:   Faxed to ...        [facility] ([facility])             , [place]         Ph: [phone]       Fax: [phone]   RxID:   [id number] METFORMIN HCL 1000 MG TABS (METFORMIN HCL) one tab by mouth two  times a day  #180 x 3   Entered and Authorized by:   Shaune Leeks MD   Signed by:   Shaune Leeks MD on 04/30/2010   Method used:   Faxed to ...       [facility] ([facility])             , [place]         Ph: [phone]       Fax: [phone]   RxID:   [id number]    Orders Added: 1)  Cardiology Referral [Cardiology] 2)  Est. Patient 40-64 years [phone]    Current Allergies (reviewed today): No known allergies

## 2010-06-12 ENCOUNTER — Encounter: Payer: Self-pay | Admitting: Cardiology

## 2010-06-12 ENCOUNTER — Telehealth: Payer: Self-pay | Admitting: Family Medicine

## 2010-06-12 ENCOUNTER — Ambulatory Visit (INDEPENDENT_AMBULATORY_CARE_PROVIDER_SITE_OTHER): Payer: BC Managed Care – PPO | Admitting: Cardiology

## 2010-06-12 DIAGNOSIS — I10 Essential (primary) hypertension: Secondary | ICD-10-CM

## 2010-06-12 DIAGNOSIS — R0609 Other forms of dyspnea: Secondary | ICD-10-CM | POA: Insufficient documentation

## 2010-06-12 DIAGNOSIS — R0602 Shortness of breath: Secondary | ICD-10-CM | POA: Insufficient documentation

## 2010-06-14 ENCOUNTER — Encounter: Payer: Self-pay | Admitting: Family Medicine

## 2010-06-17 NOTE — Assessment & Plan Note (Signed)
Summary: np6/palp-mb-per [phone] pt have bcbs-mb/d.miller   CC:  referal from Dr. Hetty Ely.  History of Present Illness: 60 year old male for evaluation of chest pain. Previous cardiac catheterization in August of 2003 revealed: The left main had luminal irregularities. The LAD had proximal mild calcification with a proximal focal 25% stenosis and mid 25% stenosis. The circumflex in the proximal AV groove had luminal irregularities. There was a mid obtuse marginal with mid 30% stenosis. The right coronary artery was dominant with luminal irregularities. The EF was 65% with normal wall motion. Last fall the patient noticed that he was having more dyspnea on exertion than normal. There was associated palpitations. There was no chest pain. His Actos was discontinued and his symptoms improved. However he continues to have some dyspnea with more extreme activities. There is no orthopnea, PND, pedal edema, syncope or exertional chest pain. Because of the above we were asked to further evaluate.  Current Medications (verified): 1)  Metformin Hcl 1000 Mg Tabs (Metformin Hcl) .... One Tab By Mouth Two Times A Day 2)  Altace 5 Mg Caps (Ramipril) .... Take 1 Capsule By Mouth At Night 3)  Lipitor 10 Mg Tabs (Atorvastatin Calcium) .... Take 1/2 Tablet By Mouth At Bedtime 4)  Probenecid 500 Mg Tabs (Probenecid) .... Take 1 Tablet By Mouth Twice A Day 5)  Glimepiride 4 Mg Tabs (Glimepiride) .... One Tab By Mouth Two Times A Day 6)  Multivitamins   Tabs (Multiple Vitamin) .... Take One By Mouth Once A Day 7)  Bayer Aspirin Ec Low Dose 81 Mg Tbec (Aspirin) .[name].. 1 Daily By Mouth 8)  Flomax 0.4 Mg Caps (Tamsulosin Hcl) .... One Tab By Mouth Once A Day.  Allergies: No Known Drug Allergies  Past History:  Past Medical History: SLEEP APNEA (ICD-780.57) MRSA,HX OF (ICD-041.12) CORONARY ATHEROSCLEROSIS NATIVE CORONARY ARTERY (ICD-414.01) GERD,HX OF  ANEMIA DUE TO DIETARY IRON DEFICIENCY  (ICD-280.1) SPONDYLOSIS, CERVICAL/LUMBAR (ICD-721.0) FATTY LIVER DISEASE, MILD VIA U/S (ICD-571.8) HYPERURICEMIA, H/O GOUTY ARTHRITIS (ICD-790.6) DIABETES MELLITUS, TYPE II (ICD-250.00) HYPERCHOLESTEROLEMIA, PURE, TRIG (219) (ICD-272.0)  Past Surgical History: ARTHROSCOPY, KNEE, HX OF (ICD-V45.89) Abd U/S--Fatty liver 03/30/00 Colonoscopy wnl 12/06/02 Right knee surg, Dr. August Saucer, med meniscus tear via MRI 05/21/03 Myelogram, bulging  disc 08/05  Family History: Reviewed history from 04/30/2010 and no changes required. Father: Died at age 20 of heart attack with coronary disease, hypertension, congestive heart failure; first MI at age 36 Mother: dec (2006) CHF Siblings: Two brothers,  one A 50  has severe headaches from arsenic in the past from wood that was treated on his deck                                      other A 62 Sister A 3 Sister A 60  Social History: Reviewed history from 04/30/2010 and no changes required. Marital Status: Married Children: 2 children Occupation: Nutritional therapist Tobacco Use - Former.  Occasional ETOH  Review of Systems       no fevers or chills, productive cough, hemoptysis, dysphasia, odynophagia, melena, hematochezia, dysuria, hematuria, rash, seizure activity, orthopnea, PND, pedal edema, claudication. Remaining systems are negative.   Vital Signs:  Patient profile:   60 year old male Height:      67 inches Weight:      225 pounds BMI:     35.37 Pulse rate:   87 / minute Resp:  14 per minute BP sitting:   120 / 60  (left arm)  Vitals Entered By: Kem Parkinson (June 12, 2010 10:48 AM)  Physical Exam  General:  Well developed/well nourished in NAD Skin warm/dry Patient not depressed No peripheral clubbing Back-normal HEENT-normal/normal eyelids Neck supple/normal carotid upstroke bilaterally; no bruits; no JVD; no thyromegaly chest - CTA/ normal expansion CV - RRR/normal S1 and S2; no murmurs, rubs or  gallops;  PMI nondisplaced Abdomen -NT/ND, no HSM, no mass, + bowel sounds, no bruit 2+ femoral pulses, no bruits Ext-no edema, chords, 2+ DP Neuro-grossly nonfocal     EKG  Procedure date:  06/12/2010  Findings:      Sinus rhythm with no ST changes.  Impression & Recommendations:  Problem # 1:  DYSPNEA (ICD-786.05) Patient with increased dyspnea on exertion and multiple risk factors. He also plans to initiate an exercise program. Plan stress echocardiogram for risk stratification. This will also quantify LV function. His updated medication list for this problem includes:    Altace 5 Mg Caps (Ramipril) .[name]... Take 1 capsule by mouth at night    Bayer Aspirin Ec Low Dose 81 Mg Tbec (Aspirin) .[name]... 1 daily by mouth  Problem # 2:  CORONARY ATHEROSCLEROSIS NATIVE CORONARY ARTERY (ICD-414.01) Mild coronary disease on previous catheterization. Continue aspirin and statin. His updated medication list for this problem includes:    Altace 5 Mg Caps (Ramipril) .[name]... Take 1 capsule by mouth at night    Bayer Aspirin Ec Low Dose 81 Mg Tbec (Aspirin) .[name]... 1 daily by mouth  Problem # 3:  DIABETES MELLITUS, TYPE II (ICD-250.00)  The following medications were removed from the medication list:    Actos 45 Mg Tabs (Pioglitazone hcl) ..... One tab by mouth once a day. His updated medication list for this problem includes:    Metformin Hcl 1000 Mg Tabs (Metformin hcl) ..... One tab by mouth two times a day    Altace 5 Mg Caps (Ramipril) .[name]... Take 1 capsule by mouth at night    Glimepiride 4 Mg Tabs (Glimepiride) ..... One tab by mouth two times a day    Bayer Aspirin Ec Low Dose 81 Mg Tbec (Aspirin) .[name]... 1 daily by mouth  Problem # 4:  HYPERCHOLESTEROLEMIA, PURE, TRIG (219) (ICD-272.0) Continue statin. Lipids and liver monitored by primary care. His updated medication list for this problem includes:    Lipitor 10 Mg Tabs (Atorvastatin calcium) .[name]... Take 1/2 tablet by mouth at  bedtime  Problem # 5:  ESSENTIAL HYPERTENSION, BENIGN (ICD-401.1) Blood pressure controlled on present medications. Will continue. Potassium and renal function monitored by primary care. His updated medication list for this problem includes:    Altace 5 Mg Caps (Ramipril) .[name]... Take 1 capsule by mouth at night    Bayer Aspirin Ec Low Dose 81 Mg Tbec (Aspirin) .[name]... 1 daily by mouth  Other Orders: Stress Echo (Stress Echo)  Patient Instructions: 1)  Your physician has requested that you have a stress echocardiogram. For further information please visit [email or web address]  Please follow instruction sheet as given.

## 2010-06-17 NOTE — Progress Notes (Signed)
Summary: wants back dated scripts  Phone Note Call from Patient Call back at Home Phone [phone]   Caller: Patient Call For: Shaune Leeks MD Summary of Call: Pt is asking for scripts for aspirin, test strips, and vitamin D so that his flexible spending account can pay him back.  He wants the scripts back dated to 08/18/2009.  Can that be done? Initial call taken by: Lowella Petties CMA, AAMA,  June 12, 2010 5:01 PM  Follow-up for Phone Call        I can write them today and say her was eligible for them then but I cannot just backdate them. Let me know if he wants this done and that he indeed spent his own money on these things. (His insurance co should be paying for the test strips as a usual thing. Have him check with his insurance co.) Follow-up by: Shaune Leeks MD,  June 12, 2010 5:10 PM  Additional Follow-up for Phone Call Additional follow up Details #1::        Advised pt.  He says it's fine to write the scripts as you need to write them, as long as it includes refills. He says he has used his own money for these products, has never had a script for test strips, but would like to in the future, (he will check with his insurance company about that).  Please mail script to his home address. Additional Follow-up by: Lowella Petties CMA, AAMA,  June 12, 2010 5:25 PM    Additional Follow-up for Phone Call Additional follow up Details #2::    Script written longhand for glucometer, strips and lancets, etc for from today forward and from 08/18/09. Let us know what machine he has or gets to put in the chart. Other scripts per EMR. Follow-up by: Shaune Leeks MD,  June 12, 2010 5:55 PM  Additional Follow-up for Phone Call Additional follow up Details #3:: Details for Additional Follow-up Action Taken: Scripts mailed.             Lowella Petties CMA, AAMA  June 13, 2010 10:32 AM   New/Updated Medications: VITAMIN D 1000 UNIT TABS (CHOLECALCIFEROL)  one tab by mouth two times a day Prescriptions: VITAMIN D 1000 UNIT TABS (CHOLECALCIFEROL) one tab by mouth two times a day  #180 x 4   Entered and Authorized by:   Shaune Leeks MD   Signed by:   Shaune Leeks MD on 06/12/2010   Method used:   Print then Give to Patient   RxID:   [id number] MULTIVITAMINS   TABS (MULTIPLE VITAMIN) Take one by mouth once a day  #100 x 4   Entered and Authorized by:   Shaune Leeks MD   Signed by:   Shaune Leeks MD on 06/12/2010   Method used:   Print then Give to Patient   RxID:   [id number] BAYER ASPIRIN EC LOW DOSE 81 MG TBEC (ASPIRIN) 1 daily by mouth  #100 x 4   Entered and Authorized by:   Shaune Leeks MD   Signed by:   Shaune Leeks MD on 06/12/2010   Method used:   Print then Give to Patient   RxID:   [id number]

## 2010-06-26 ENCOUNTER — Telehealth (INDEPENDENT_AMBULATORY_CARE_PROVIDER_SITE_OTHER): Payer: Self-pay | Admitting: *Deleted

## 2010-06-27 ENCOUNTER — Ambulatory Visit (HOSPITAL_COMMUNITY): Payer: BC Managed Care – PPO | Attending: Cardiology

## 2010-06-27 ENCOUNTER — Encounter: Payer: Self-pay | Admitting: Cardiology

## 2010-06-27 DIAGNOSIS — R0989 Other specified symptoms and signs involving the circulatory and respiratory systems: Secondary | ICD-10-CM | POA: Insufficient documentation

## 2010-06-27 DIAGNOSIS — E119 Type 2 diabetes mellitus without complications: Secondary | ICD-10-CM | POA: Insufficient documentation

## 2010-06-27 DIAGNOSIS — R079 Chest pain, unspecified: Secondary | ICD-10-CM | POA: Insufficient documentation

## 2010-06-27 DIAGNOSIS — R072 Precordial pain: Secondary | ICD-10-CM

## 2010-06-27 DIAGNOSIS — Z8249 Family history of ischemic heart disease and other diseases of the circulatory system: Secondary | ICD-10-CM | POA: Insufficient documentation

## 2010-06-27 DIAGNOSIS — R5381 Other malaise: Secondary | ICD-10-CM | POA: Insufficient documentation

## 2010-06-27 DIAGNOSIS — R0609 Other forms of dyspnea: Secondary | ICD-10-CM | POA: Insufficient documentation

## 2010-06-27 DIAGNOSIS — I10 Essential (primary) hypertension: Secondary | ICD-10-CM | POA: Insufficient documentation

## 2010-07-01 NOTE — Progress Notes (Signed)
Summary: stress echo appt  Phone Note Outgoing Call Call back at [facility] Phone [phone]   Call placed by: Stanton Kidney, EMT-P,  June 26, 2010 3:24 PM Action Taken: Phone Call Completed Summary of Call: Left message ref: stress echo appt. Stanton Kidney, EMT-P  June 26, 2010 3:24 PM

## 2010-07-30 ENCOUNTER — Encounter: Payer: Self-pay | Admitting: Family Medicine

## 2010-07-30 ENCOUNTER — Ambulatory Visit (INDEPENDENT_AMBULATORY_CARE_PROVIDER_SITE_OTHER): Payer: BC Managed Care – PPO | Admitting: Family Medicine

## 2010-07-30 DIAGNOSIS — I10 Essential (primary) hypertension: Secondary | ICD-10-CM

## 2010-07-30 DIAGNOSIS — E119 Type 2 diabetes mellitus without complications: Secondary | ICD-10-CM

## 2010-07-30 NOTE — Progress Notes (Signed)
  Subjective:    Patient ID: Alan Morrison, male    DOB: 09/19/1950, 60 y.o.   MRN: [id number]  HPI Pt here to discuss diabetes with his diary. He has been getting progressively worse controlled since going off Actos and anything he has tried has not helped a whole lot. He does not want to go on insulin and actually started Actos back again last week. He feels well and understands the risks. He was seen by Dr Antoine Poche and had a Stress  Echo done which was totally normal which has made him more relaxed. He realizes that he does indeed need to lose weight. He feels well.    Review of Systems  Constitutional: Negative for fever, chills, diaphoresis, activity change, appetite change and fatigue.  HENT: Negative for hearing loss, ear pain, congestion, sore throat, rhinorrhea, neck pain, neck stiffness, postnasal drip, sinus pressure, tinnitus and ear discharge.   Eyes: Negative for pain, discharge and visual disturbance.  Respiratory: Negative for cough, shortness of breath and wheezing.   Cardiovascular: Negative for chest pain and palpitations.       No SOB w/ exertion  Gastrointestinal:       No heartburn or swallowing problems.  Genitourinary:       No nocturia  Skin:       No itching or dryness.  Neurological:       No tingling or balance problems.  All other systems reviewed and are negative.       Objective:   Physical Exam  Constitutional: He appears well-developed and well-nourished. No distress.  HENT:  Head: Normocephalic and atraumatic.  Right Ear: External ear normal.  Left Ear: External ear normal.  Nose: Nose normal.  Mouth/Throat: Oropharynx is clear and moist.  Eyes: Conjunctivae and EOM are normal. Pupils are equal, round, and reactive to light. Right eye exhibits no discharge. Left eye exhibits no discharge.  Neck: Normal range of motion. Neck supple.  Cardiovascular: Normal rate and regular rhythm.   Pulmonary/Chest: Effort normal and breath sounds normal. He has  no wheezes.  Lymphadenopathy:    He has no cervical adenopathy.  Skin: He is not diaphoretic.          Assessment & Plan:

## 2010-07-30 NOTE — Assessment & Plan Note (Signed)
Has started back on Actos. We discussed risk and benefits. He is perfectly willing to assume same and feels more comfortable, at least with the risks of the heart. We also discussed the 3-6 month time frame of effectiveness of the medication due to the metabolic nature of the medicines effect.

## 2010-07-30 NOTE — Patient Instructions (Signed)
RTC 1/13 for Comp Exam, labs prior. 

## 2010-07-30 NOTE — Assessment & Plan Note (Signed)
Stable. Cont curr meds. Weight loss will help almost all of his risk factors. BP Readings from Last 3 Encounters:  07/30/10 112/74  06/12/10 120/60  04/30/10 110/68

## 2010-07-31 ENCOUNTER — Other Ambulatory Visit: Payer: Self-pay | Admitting: *Deleted

## 2010-07-31 MED ORDER — PIOGLITAZONE HCL 45 MG PO TABS: 45.0000 mg | ORAL_TABLET | Freq: Every day | ORAL | Status: DC

## 2010-08-04 LAB — POCT I-STAT, CHEM 8
BUN: 28 mg/dL — ABNORMAL HIGH (ref 6–23)
Calcium, Ion: 1.19 mmol/L (ref 1.12–1.32)
HCT: 46 % (ref 39.0–52.0)
Sodium: 138 mEq/L (ref 135–145)
TCO2: 25 mmol/L (ref 0–100)

## 2010-08-04 LAB — BASIC METABOLIC PANEL
BUN: 15 mg/dL (ref 6–23)
CO2: 23 mEq/L (ref 19–32)
Chloride: 105 mEq/L (ref 96–112)
Chloride: 107 mEq/L (ref 96–112)
Creatinine, Ser: 0.93 mg/dL (ref 0.4–1.5)
GFR calc Af Amer: >60 mL/min (ref >60)
GFR calc non Af Amer: >60 mL/min (ref >60)
Glucose, Bld: 117 mg/dL — ABNORMAL HIGH (ref 70–99)
Potassium: 3.9 mEq/L (ref 3.5–5.1)
Potassium: 3.9 mEq/L (ref 3.5–5.1)
Sodium: 136 mEq/L (ref 135–145)
Sodium: 136 mEq/L (ref 135–145)

## 2010-08-04 LAB — DIFFERENTIAL
Basophils Absolute: 0 10*3/uL (ref 0.0–0.1)
Basophils Absolute: 0 10*3/uL (ref 0.0–0.1)
Basophils Relative: 0 % (ref 0–1)
Eosinophils Relative: 0 % (ref 0–5)
Eosinophils Relative: 0 % (ref 0–5)
Lymphocytes Relative: 5 % — ABNORMAL LOW (ref 12–46)
Lymphocytes Relative: 6 % — ABNORMAL LOW (ref 12–46)
Monocytes Absolute: 0.6 10*3/uL (ref 0.1–1.0)
Monocytes Absolute: 0.7 10*3/uL (ref 0.1–1.0)
Monocytes Relative: 7 % (ref 3–12)
Neutro Abs: 9.4 10*3/uL — ABNORMAL HIGH (ref 1.7–7.7)

## 2010-08-04 LAB — URINE CULTURE
Colony Count: >=100000
Culture: NO GROWTH
Culture: NO GROWTH

## 2010-08-04 LAB — CULTURE, BLOOD (ROUTINE X 2)

## 2010-08-04 LAB — CBC
HCT: 40.9 % (ref 39.0–52.0)
Hemoglobin: 14 g/dL (ref 13.0–17.0)
MCHC: 34.1 g/dL (ref 30.0–36.0)
MCHC: 34.4 g/dL (ref 30.0–36.0)
MCV: 87.1 fL (ref 78.0–100.0)
Platelets: 162 10*3/uL (ref 150–400)
Platelets: 209 10*3/uL (ref 150–400)
RBC: 4.69 MIL/uL (ref 4.22–5.81)
RDW: 14.3 % (ref 11.5–15.5)
WBC: 10.5 10*3/uL (ref 4.0–10.5)
WBC: 6.2 10*3/uL (ref 4.0–10.5)

## 2010-08-04 LAB — COMPREHENSIVE METABOLIC PANEL
AST: 37 U/L (ref 0–37)
Albumin: 4 g/dL (ref 3.5–5.2)
Chloride: 101 mEq/L (ref 96–112)
Creatinine, Ser: 0.97 mg/dL (ref 0.4–1.5)
GFR calc Af Amer: >60 mL/min (ref >60)
Potassium: 3.8 mEq/L (ref 3.5–5.1)
Total Bilirubin: 0.7 mg/dL (ref 0.3–1.2)

## 2010-08-04 LAB — URINALYSIS, ROUTINE W REFLEX MICROSCOPIC
Bilirubin Urine: NEGATIVE
Glucose, UA: NEGATIVE mg/dL
Hgb urine dipstick: NEGATIVE
Ketones, ur: 15 mg/dL — AB
Protein, ur: 100 mg/dL — AB
Specific Gravity, Urine: 1.021 (ref 1.005–1.030)
Urobilinogen, UA: 0.2 mg/dL (ref 0.0–1.0)
pH: 7 (ref 5.0–8.0)

## 2010-08-04 LAB — STOOL CULTURE

## 2010-08-04 LAB — POCT URINALYSIS DIP (DEVICE)
Bilirubin Urine: NEGATIVE
Ketones, ur: NEGATIVE mg/dL
pH: 7.5 (ref 5.0–8.0)

## 2010-08-04 LAB — CLOSTRIDIUM DIFFICILE EIA: C difficile Toxins A+B, EIA: NEGATIVE

## 2010-09-02 NOTE — Discharge Summary (Signed)
NAMEJASIAH, ELSEN NO.:  [id number]   MEDICAL RECORD NO.:  [id number]          PATIENT TYPE:  INP   LOCATION:  1342                         FACILITY:  [facility]   PHYSICIAN:  Willow Ora, MD           DATE OF BIRTH:  Feb 12, 1951   DATE OF ADMISSION:  04/27/2008  DATE OF DISCHARGE:  04/29/2008                               DISCHARGE SUMMARY   PRIMARY CARE PHYSICIAN:  Dr. Hetty Ely at [facility] at [facility].   BRIEF HISTORY AND PHYSICAL:  Mr. Bok is a 60 year old gentleman who,  on April 22, 2008, developed dysuria, frequency and urgency as well as  fever and chills.  He went to the emergency room where he was found to  be febrile and the urinalysis showed white blood cells too numerous to  count.  He was sent home on Cipro but he was called the next day because  one of the blood cultures was reported to be growing gram-negative rods,  and he given a dose of Rocephin.  He also developed some dyspepsia,  believed to be from Cipro.  The urine culture done at the emergency  department on April 22, 2008 eventually grew MRSA and the blood culture  eventually grew E-coli in one of the two sets that they did.  On January  5, he was changed to Septra by his primary care doctor to cover both  organisms, the E-coli and the MRSA.  He presented to the office on  April 27, 2008 still feeling unwell and having fever the night prior to  the office visit.  With the recent positive cultures and MRSA in the  urine, is it was felt that the best thing to do is to admit him to the  hospital.   PHYSICAL EXAM:  VITAL SIGNS:  Upon admission, his temperature was 98.5,  pulse 88, blood pressure 120/80.  LUNGS:  Clear to auscultation bilaterally.  CARDIOVASCULAR:  Regular rate and rhythm without murmur.  ABDOMEN:  Soft, nontender, normal bowel sounds without distention, mass,  or guarding.  NEUROLOGIC:  Intact.   LABORATORY AND X-RAYS:  CBC done on April 23, 2008 showed a  white count  of 10.7, hemoglobin of 15.2 and platelets of 162.  CBC done on April 27, 2008 showed white of 6.2 with a hemoglobin of 14.0 and platelets of  215,000.  Creatinine was 0.9, potassium 3.8.  LFTs were normal.  Calcium  was normal.  Clostridium difficile in the stool was negative x1.  Stool  cultures are pending.  A urine culture obtained on April 27, 2008 is so  far negative.  He also has 2 blood cultures pending from April 27, 2008.   HOSPITAL COURSE:  The patient was admitted to the hospital due to urine  culture that showed MRSA and a blood culture that showed E-coli.  Upon  admission he was started on vancomycin IV as well as IV Cipro.  ID was  consulted.  They felt that the most likely cause of this patient's  illness is E-coli pyelonephritis with  transient bacteremia, and they  cannot explain with certainty the results of the urine culture.  They  recommended that, if the patient is clinically well, he can be  discharged on Bactrim for 7 more days.  On rounds today, he feels better  aside from some diarrhea which is no bloody, just slightly dark. On  exam, he is alert, oriented, nontoxic, his abdomen is benign and  consequently, at this point, I think he got maximal hospital benefit.  He will be discharged with the following instructions.   1. Continue with home medications as before.  2. Glucophage 500 mg one p.o. b.i.d.  3. Avandia 8 mg 1 p.o. daily.  4. Altace 5 mg 1 p.o. at night.  5. Lipitor 10 mg half tablet by mouth at bedtime.  6. Probenecid 500 mg 1 tablet twice a day.  7. Amaryl 2 mg 1 two times a day.  8. Multivitamins.  9. Aspirin 81 once daily.  10.Bactrim twice a day until May 06, 2008.  11.Pepto-Bismol over-the-counter for diarrhea.  12.Prilosec over-the-counter for indigestion.  13.Align 1 tablet daily for 10-14 days.  14.He is recommended to see his primary care doctor in 3 to 4 days.  15.He is recommended to call his primary care doctor  if his symptoms      resurface or the diarrhea increases.  16.The patient will discuss with primary care doctor the need for      further workup in light of the recent pyelonephritis with      bacteremia.   ADMITTING DIAGNOSES:  Bacteremia.   DISCHARGE DIAGNOSES:  1. Pyelonephritis with transient bacteremia.  2. Recent urine culture positive for methicillin-resistant      Staphylococcus aureus.  3. Diabetes.  4. Hypertension which was well controlled during this admission.  5. Hypercholesterolemia.  6. Mild dyspepsia and diarrhea, believed to be due to Cipro.  The      patient is now on Bactrim.  We obtained stool tests during this      admission.  Negative for clostridium difficile.      Willow Ora, MD  Electronically Signed     JP/MEDQ  D:  04/29/2008  T:  04/29/2008  Job:  [id number]   cc:   Arta Silence, MD  Fax: [phone]

## 2010-09-05 NOTE — Cardiovascular Report (Signed)
   [name], [name]:  [id number]   MEDICAL RECORD NO.:  [id number]                   PATIENT TYPE:  INP   LOCATION:  2005                                 FACILITY:  [facility]   PHYSICIAN:  Rollene Rotunda, M.D. [facility]            DATE OF BIRTH:  30-Jan-1951   DATE OF PROCEDURE:  12/12/2001  DATE OF DISCHARGE:                              CARDIAC CATHETERIZATION   DATE OF BIRTH:  08-Oct-1950   PRIMARY CARE PHYSICIAN:  Laurita Quint, M.D.   PROCEDURE:  Left heart catheterization/coronary arteriography.   INDICATIONS:  Evaluate patient with chest pain suggestive of unstable angina  (786.51).   DESCRIPTION OF PROCEDURE:  Left heart catheterization was performed via the  left femoral artery. The artery was cannulated using the anterior wall  puncture. A #6 French arterial sheath was inserted via the modified  Seldinger technique. Preformed Judkins and a pigtail catheter were utilized.  The patient tolerated the procedure well and left the lab in stable  condition.   HEMODYNAMICS:  LV 121/19, AO 121/70.   CORONARY ARTERIES:  The left main had luminal irregularities.   The LAD had proximal mild calcification with a proximal focal 25% stenosis  and mid 25% stenosis.   The circumflex in the proximal AV groove had luminal irregularities. There  was a mid obtuse marginal with mid 30% stenosis.   The right coronary artery was dominant with luminal irregularities.   LEFT VENTRICULOGRAM:  The left ventriculogram was obtained in the RAO  projection. The EF was 65% with normal wall motion.   CONCLUSION:  Minimal coronary artery plaquing.  Normal left ventricular  function.    PLAN:  No further cardiac work-up is suggested. The patient will continue to  have risk factor modification per Dr. Hetty Ely.  If he continues to have  chest pain, he will have further work-up for nonanginal etiologies.   ]        Rollene Rotunda, M.D. [facility]    JH/MEDQ  D:  12/12/2001  T:  12/13/2001  Job:  [id number]   cc:   Laurita Quint, M.D.

## 2010-09-05 NOTE — Procedures (Signed)
NAMEROMALDO, SAVILLE NO.:  [id number]   MEDICAL RECORD NO.:  [id number]          PATIENT TYPE:  OUT   LOCATION:  SLEEP CENTER                 FACILITY:  [facility]   PHYSICIAN:  Marcelyn Bruins, M.D. [facility] DATE OF BIRTH:  12/14/1950   DATE OF ADMISSION:  11/02/2003  DATE OF DISCHARGE:  11/02/2003                              NOCTURNAL POLYSOMNOGRAM   LOCATION:  Sleep lab.   REFERRING PHYSICIAN:  Marcelyn Bruins, M.D. [facility]   DATE OF ADMISSION:  November 02, 2003   DATE OF DISCHARGE:  November 02, 2003   INDICATION FOR THE STUDY:  Hypersomnia with sleep apnea.   SLEEP ARCHITECTURE:  Total sleep time was 441 minutes with adequate REM but  decreased slow-wave sleep.  Sleep onset latency and REM latency were normal.   IMPRESSION/RECOMMENDATION:  1. Moderate obstructive sleep apnea, hypopnea syndrome with mild to moderate     O2 desaturation noted.  Events were not positional, nor were they solely     REM related.  2.  Very loud snoring noted throughout the study.  3.  No     clinically significant cardiac arrhythmias.                                   ______________________________                                Marcelyn Bruins, M.D. [facility]     KC/MEDQ  D:  11/06/2003 10:27:50  T:  11/06/2003 15:44:45  Job:  [id number]

## 2010-09-05 NOTE — Discharge Summary (Signed)
Alan Morrison, Alan Morrison                           ACCOUNT NO.:  [id number]   MEDICAL RECORD NO.:  [id number]                   PATIENT TYPE:  INP   LOCATION:  2005                                 FACILITY:  [facility]   PHYSICIAN:  Madolyn Frieze. Jens Som, M.D. [facility]         DATE OF BIRTH:  12-Mar-1951   DATE OF ADMISSION:  12/09/2001  DATE OF DISCHARGE:  12/12/2001                                 DISCHARGE SUMMARY   DISCHARGE DIAGNOSES:  1. Chest pain, status post cardiac catheterization.  2. Diabetes mellitus.  3. Hyperlipidemia.  4. Gout.   HOSPITAL COURSE:  The patient is a pleasant 60 year old male with no known  coronary artery disease.  He presented to the emergency room on August 22nd  complaining of substernal chest pain which had been progressive in nature  and awakening him at night.  He was seen and admitted by Dr. Madolyn Frieze.  [place].  Given the patient's medical history and symptoms, Dr. Jens Som  felt that cardiac catheterization was indicated.  The patient was started on  Lovenox as well as IV nitroglycerin and a beta blocker.   Over the weekend, the patient remained stable and pain-free.   On Monday, August 22nd, the patient had his cardiac cath done by Dr. Rollene Rotunda.  Catheterization results:  #1 - Left main coronary artery:  Luminal irregularities.  #2 - Left anterior descending:  Proximal mild  calcifications; proximal 25%, mid 25%.  #3 - Left circumflex:  Luminal  irregularities in the proximal portion of the A-V groove; mid-obtuse  marginal branch, mid-vessel 30%.  #4 - Right coronary artery:  Dominant; no  irregularities.  #5 - Left ventriculogram:  Ejection fraction 65%, no wall  motion abnormalities __________ .  Dr. Antoine Poche felt that there was minimal  coronary disease, with a normal left ventricular function.  He felt that the  patient's pain was likely non-coronary in nature and that he would be safe  for discharge after the usual wait time.   DISCHARGE  MEDICATIONS:  1. Glucophage 500 mg b.i.d.  2. Hytrin b.i.d. (to be restarted December 14, 2001).  3. Enteric-coated aspirin 325 mg q.d.  4. Altace 5 mg q.d.  5. Lipitor 5 mg (one-half 10 mg tablet) q.h.s.  6. Probenecid 250 mg b.i.d.  7. Avandia 4 mg q.d.   LABORATORY AND ACCESSORY CLINICAL DATA:  Sodium 141, potassium 4.1, chloride  109, CO2 26, BUN 15, creatinine 1.0, glucose 104, total cholesterol 115,  triglycerides 81, HDL of 40, LDL of 59, total cholesterol/HDL ratio 2.9.  White count 6.3, hemoglobin 15.0, hematocrit 43.6, platelets 241,000.   Electrocardiogram shows sinus rhythm at 84, P-R interval 178, QRS 100, QTc  402, axis 54.   DISCHARGE INSTRUCTIONS:  1. The patient is to avoid driving, heavy lifting or tub baths for two days.  2. He is to follow a low-fat diabetic diet.  3. He is to watch the cath  site for any pain, bleeding or swelling and to     call the [facility] with any of these problems.  4. He is to follow up with Dr. Laurita Quint as needed or as scheduled.       Annett Fabian, P.A. [facility]                  Madolyn Frieze. Jens Som, M.D. [facility]    CKM/MEDQ  D:  12/12/2001  T:  12/14/2001  Job:  [id number]   cc:   Laurita Quint, M.D.

## 2010-09-05 NOTE — Procedures (Signed)
NAMESEMAJE, KINKER NO.:  [id number]   MEDICAL RECORD NO.:  [id number]          PATIENT TYPE:  OUT   LOCATION:  SLEEP CENTER                 FACILITY:  [facility]   PHYSICIAN:  Marcelyn Bruins, M.D. [facility] DATE OF BIRTH:  02/18/1951   DATE OF ADMISSION:  01/02/2004                              NOCTURNAL POLYSOMNOGRAM   REFERRING PHYSICIAN:  Dr. Marcelyn Bruins   INDICATION FOR STUDY:  Hypersomnia with sleep apnea.  The patient returns  for pressure optimization.   SLEEP ARCHITECTURE:  The patient had a total sleep time of 437 minutes with  a significant amount of REM rebound and very little slow wave sleep.  Sleep  onset latency was normal and REM latency was very short.  Sleep efficiency  was 96%.   IMPRESSION:  1.  Good control of previously documented obstructive sleep apnea with a      CPAP pressure of 14 cm.  The patient wore his CPAP mask from home.      There was good tolerance and no breakthrough snoring.  2.  Occasional PAC.  Otherwise, no clinically significant cardiac      arrhythmia.      KC/MEDQ  D:  01/31/2004 16:44:55  T:  01/31/2004 17:03:56  Job:  [id number]

## 2010-09-05 NOTE — H&P (Signed)
NAMESHERLEY, LESER                           ACCOUNT NO.:  [id number]   MEDICAL RECORD NO.:  [id number]                   PATIENT TYPE:  INP   LOCATION:  2005                                 FACILITY:  [facility]   PHYSICIAN:  Madolyn Frieze. Jens Som, M.D. [facility]         DATE OF BIRTH:  [birth year]   DATE OF ADMISSION:  12/09/2001  DATE OF DISCHARGE:                                HISTORY & PHYSICAL   HISTORY OF PRESENT ILLNESS:  The patient is a 60 year old gentleman with a  past medical history of diabetes mellitus who is being admitted for chest  pain.  The patient has no prior cardiac history.  Of note, he was seen in  this office by myself in May 2000.  At that time he was scheduled to have a  Cardiolite which was performed in June 2000.  This showed normal perfusion  and an ejection fraction of 64%.  The patient states that for the past two  months he has had occasional substernal chest pain.  It is in the left  substernal area and is described as a tightness.  It typically occurs at  night and awakens him from sleep.  There is associated shortness of breath  but there is no nausea, vomiting and no diaphoresis.  After sitting up  taking deep breaths the pain improved.  He also has noted the pain after  exerting himself.  It does not occur with exertion.  He denies any pleuritic  component to the pain and does not relate it to food.  There is no water  brash associated with his pain.  Because of the recurrent symptom we were  asked to further evaluate.  Of note, there is no orthopnea or PND and no  pedal edema.  He has had no syncope.   PRESENT MEDICATIONS:  Glucophage 500 mg p.o. b.i.d., aspirin 325 mg p.o.  q.d., Altace 5 mg p.o. q.d., Lipitor 5 mg p.o. q.d., Probenecid 250 mg p.o.  b.i.d., multivitamin, and Avandia 4 mg p.o. q.d.   ALLERGIES:  He has no known drug allergies.   SOCIAL HISTORY:  He does not smoke nor does he consume alcohol.   FAMILY HISTORY:  Positive for coronary  artery disease for his father.   PAST MEDICAL HISTORY:  He does have diabetes mellitus as well as mild  hyperlipidemia but there is no hypertension.  He does have a history of  gout.  He has had prior arthroscopic knee surgery but no other surgeries  were noted.   REVIEW OF SYMPTOMS:  He denies any headaches or fevers or chills.  There is  no productive cough or hemoptysis.  There is no dysphagia, odynophagia,  melena or hematochezia.  There is no history of hematuria.  There is no rash  or disease activity.  There is no orthopnea, PND or pedal edema.  The  remainder of the review of systems are negative.  PHYSICAL EXAMINATION:  VITAL SIGNS:  Blood pressure 122/79, pulse 84.  GENERAL:  He is well-developed and well-nourished, in no acute distress.  SKIN:  Warm and dry.  HEENT:  Unremarkable.  NECK:  Supple with normal lymph nodes bilaterally.  There are no bruits  noted.  There is no jugular venous distention.  CHEST:  Clear to auscultation.  Normal expansion.  CARDIOVASCULAR:  Regular rate and rhythm.  Normal S1 and S2.  There are no  murmurs, rubs or gallops noted.  ABDOMEN:  Nontender.  Positive bowel sounds.  No hepatosplenomegaly.  No  masses appreciated.  There was no abdominal bruit.  He has 2+ normal pulses  bilaterally and no bruits.  EXTREMITIES:  No edema.  I cannot palpate no cords.  He has 2+ posterior  tibial pulses bilaterally.  NEUROLOGIC:  Grossly intact.   LABORATORY DATA:  His electrocardiogram shows a normal sinus rhythm at a  rate of 84.  The axis is normal.  There are no significant ST changes.   DIAGNOSES:  1. Chest pain.  2. Diabetes mellitus.  3. History of mild hyperlipidemia.  4. Gout.   PLAN:  The patient presents for evaluation of chest pain.  He has both  typical and atypical features.  However, his symptoms are progressive and  they are awakening him at night.  We will admit and rule out myocardial  infarction with serial enzymes.  Given his  multiple risk factors and  recurrent symptoms as well as the progressive nature, we will plan to  proceed with coronary angiography.  The risks and benefits have been  discussed with Mr. Wierzbicki and he agrees to proceed.  We will continue with  his aspirin, Altace as well as Lipitor anticoagulation.  We will also add  Protonix for the possibility of a GI related etiology of his symptoms.                                               Madolyn Frieze Jens Som, M.D. [facility]    BSC/MEDQ  D:  12/09/2001  T:  12/12/2001  Job:  [id number]

## 2010-10-29 ENCOUNTER — Other Ambulatory Visit: Payer: Self-pay | Admitting: *Deleted

## 2010-10-29 MED ORDER — PIOGLITAZONE HCL 45 MG PO TABS: 45.0000 mg | ORAL_TABLET | Freq: Every day | ORAL | Status: DC

## 2010-10-29 NOTE — Telephone Encounter (Signed)
Form signed by Dr. Hetty Ely, faxed to [facility].

## 2010-10-29 NOTE — Telephone Encounter (Signed)
[facility] has faxed form requesting new script for actos.  Form is on your desk.

## 2011-01-22 DIAGNOSIS — D3130 Benign neoplasm of unspecified choroid: Secondary | ICD-10-CM

## 2011-01-22 HISTORY — DX: Benign neoplasm of unspecified choroid: D31.30

## 2011-01-28 ENCOUNTER — Encounter: Payer: Self-pay | Admitting: Family Medicine

## 2011-04-19 ENCOUNTER — Other Ambulatory Visit: Payer: Self-pay | Admitting: Family Medicine

## 2011-04-19 DIAGNOSIS — Z125 Encounter for screening for malignant neoplasm of prostate: Secondary | ICD-10-CM

## 2011-04-19 DIAGNOSIS — Z0000 Encounter for general adult medical examination without abnormal findings: Secondary | ICD-10-CM

## 2011-04-19 DIAGNOSIS — E119 Type 2 diabetes mellitus without complications: Secondary | ICD-10-CM

## 2011-04-30 ENCOUNTER — Other Ambulatory Visit (INDEPENDENT_AMBULATORY_CARE_PROVIDER_SITE_OTHER): Payer: BC Managed Care – PPO

## 2011-04-30 DIAGNOSIS — Z125 Encounter for screening for malignant neoplasm of prostate: Secondary | ICD-10-CM

## 2011-04-30 DIAGNOSIS — E119 Type 2 diabetes mellitus without complications: Secondary | ICD-10-CM

## 2011-04-30 DIAGNOSIS — Z0000 Encounter for general adult medical examination without abnormal findings: Secondary | ICD-10-CM

## 2011-04-30 LAB — COMPREHENSIVE METABOLIC PANEL
ALT: 38 U/L (ref 0–53)
AST: 32 U/L (ref 0–37)
Alkaline Phosphatase: 60 U/L (ref 39–117)
Creatinine, Ser: 0.9 mg/dL (ref 0.4–1.5)
GFR: 91.52 mL/min (ref >60.00)
Total Bilirubin: 0.3 mg/dL (ref 0.3–1.2)

## 2011-04-30 LAB — MICROALBUMIN / CREATININE URINE RATIO
Creatinine,U: 247.8 mg/dL
Microalb Creat Ratio: 0.4 mg/g (ref 0.0–30.0)

## 2011-04-30 LAB — LIPID PANEL
HDL: 31.6 mg/dL — ABNORMAL LOW (ref >39.00)
LDL Cholesterol: 99 mg/dL (ref 0–99)
Total CHOL/HDL Ratio: 4
Triglycerides: 46 mg/dL (ref 0.0–149.0)
VLDL: 9.2 mg/dL (ref 0.0–40.0)

## 2011-05-06 ENCOUNTER — Encounter: Payer: Self-pay | Admitting: Family Medicine

## 2011-05-06 ENCOUNTER — Ambulatory Visit (INDEPENDENT_AMBULATORY_CARE_PROVIDER_SITE_OTHER): Payer: BC Managed Care – PPO | Admitting: Family Medicine

## 2011-05-06 ENCOUNTER — Encounter: Payer: BC Managed Care – PPO | Admitting: Family Medicine

## 2011-05-06 VITALS — BP 130/80 | HR 88 | Temp 98.3°F | Ht 68.0 in | Wt 215.8 lb

## 2011-05-06 DIAGNOSIS — E119 Type 2 diabetes mellitus without complications: Secondary | ICD-10-CM

## 2011-05-06 DIAGNOSIS — Z1211 Encounter for screening for malignant neoplasm of colon: Secondary | ICD-10-CM

## 2011-05-06 DIAGNOSIS — Z23 Encounter for immunization: Secondary | ICD-10-CM

## 2011-05-06 DIAGNOSIS — Z0000 Encounter for general adult medical examination without abnormal findings: Secondary | ICD-10-CM | POA: Insufficient documentation

## 2011-05-06 MED ORDER — GLIMEPIRIDE 4 MG PO TABS: 4.0000 mg | ORAL_TABLET | Freq: Two times a day (BID) | ORAL | Status: DC

## 2011-05-06 MED ORDER — RAMIPRIL 5 MG PO CAPS: 5.0000 mg | ORAL_CAPSULE | Freq: Every day | ORAL | Status: DC

## 2011-05-06 MED ORDER — ATORVASTATIN CALCIUM 10 MG PO TABS: 10.0000 mg | ORAL_TABLET | Freq: Every day | ORAL | Status: DC

## 2011-05-06 MED ORDER — PIOGLITAZONE HCL 45 MG PO TABS: 45.0000 mg | ORAL_TABLET | Freq: Every day | ORAL | Status: DC

## 2011-05-06 MED ORDER — TAMSULOSIN HCL 0.4 MG PO CAPS: 0.4000 mg | ORAL_CAPSULE | Freq: Every day | ORAL | Status: DC

## 2011-05-06 MED ORDER — PROBENECID 500 MG PO TABS: 500.0000 mg | ORAL_TABLET | Freq: Two times a day (BID) | ORAL | Status: DC

## 2011-05-06 MED ORDER — METFORMIN HCL 1000 MG PO TABS: 1000.0000 mg | ORAL_TABLET | Freq: Two times a day (BID) | ORAL | Status: DC

## 2011-05-06 NOTE — Patient Instructions (Signed)
Return in 1 month to recheck diabetes.  If you can come in a few days prior for repeat A1c (doesn't have to be fasting). If staying elevated, we may talk about one other medicine called januvia. 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 Pneumonia shot today. Good to see you today, call us with questions.

## 2011-05-06 NOTE — Progress Notes (Signed)
Subjective:    Patient ID: Alan Morrison, male    DOB: April 02, 1951, 61 y.o.   MRN: [id number]  HPI CC: CPE today  Transfer of care from Dr. Hetty Ely.  Here for CPE today.  DM - sugars running 180-190, checks once daily at different times throughout the day.  Doesn't check fasting.  No low sugars.  Have been running a bit high.  Vision screen 01/2011.  Foot exam due.  Wt Readings from Last 3 Encounters:  05/06/11 215 lb 12 oz (97.864 kg)  07/30/10 222 lb 12 oz (101.039 kg)  06/12/10 225 lb (102.059 kg)  Has cut back on soft drinks and trying to change diet.  Preventative: Flu shot at work. Never had PNA shot - would like today. Colonoscopy - age 36yo, normal.  Rpt due 10 yrs. Prostate - last year normal.  Has seen [facility] for low testosterone, told things looking ok.  Would like to continue checking yearly.  Medications and allergies reviewed and updated in chart.  Past histories reviewed and updated if relevant as below. Patient Active Problem List  Diagnoses  . OTHER SEPTICEMIA DUE TO GRAM-NEGATIVE ORGANISM  . DIABETES MELLITUS, TYPE II  . TESTICULAR HYPOFUNCTION  . ANEMIA DUE TO DIETARY IRON DEFICIENCY  . CORONARY ATHEROSCLEROSIS NATIVE CORONARY ARTERY  . DYSPEPSIA, MILD  . BENIGN PROSTATIC HYPERTROPHY, WITH URINARY OBSTRUCTION  . SLEEP APNEA  . HEART MURMUR, HX OF  . COLONOSCOPY, HX OF  . CHEST PAIN, ATYPICAL, HX OF  . ARTHROSCOPY, KNEE, HX OF  . PALPITATIONS, OCCASIONAL  . ESSENTIAL HYPERTENSION, BENIGN  . DYSPNEA   Past Medical History  Diagnosis Date  . Sleep apnea   . History of MRSA infection   . Dyspepsia   . CAD (coronary artery disease)   . GERD (gastroesophageal reflux disease)   . Heart murmur     Hx of  . Chest pain, atypical   . Headache   . Anemia, iron deficiency, inadequate dietary intake   . Cervical spondylosis without myelopathy   . Hyperuricemia   . Diabetes mellitus type II   . Hypercholesteremia   . Stress-induced  cardiomyopathy 09/27/98    WNL, EF 64%  . Fatty liver   . History of cardiac catheterization 12/12/01    Min Dz, EF 65%, Adm.R/O'd  . History of MRI of brain and brain stem 09/06  . Urosepsis 01/01-01/05/10    Hospitalization  . Choroidal nevus 01/22/2011    left, yearly eye exam, no diabetic retinopathy   Past Surgical History  Procedure Date  . Knee arthroscopy   . Knee surgery 05/21/03    Right, Dr. August Saucer, med meniscus tear via MRI  . Myelogram 08/05    bulging disc   History  Substance Use Topics  . Smoking status: Former Games developer  . Smokeless tobacco: Never Used   Comment: quit over 20 years  . Alcohol Use: Yes     occassionally 1-2 beers a week   Family History  Problem Relation Age of Onset  . Heart disease Mother     CHF  . Heart disease Father     CHF  . Hypertension Father   . Migraines Brother     severe headaches from arsenic in the past from  wood that was treated on his deck   No Known Allergies Current Outpatient Prescriptions on File Prior to Visit  Medication Sig Dispense Refill  . aspirin 81 MG tablet Take 81 mg by mouth daily.        [name]  atorvastatin (LIPITOR) 10 MG tablet Take 1/2 by mouth at bedtime       . glimepiride (AMARYL) 4 MG tablet Take 4 mg by mouth 2 (two) times daily.        . metFORMIN (GLUCOPHAGE) 1000 MG tablet Take 1,000 mg by mouth 2 (two) times daily.        . Multiple Vitamin (MULTIVITAMIN) tablet Take 1 tablet by mouth daily.        . pioglitazone (ACTOS) 45 MG tablet Take 1 tablet (45 mg total) by mouth daily.  90 tablet  3  . ramipril (ALTACE) 5 MG capsule Take 5 mg by mouth daily.        . Tamsulosin HCl (FLOMAX) 0.4 MG CAPS Take by mouth daily.         Review of Systems  Constitutional: Negative for fever, chills, activity change, appetite change, fatigue and unexpected weight change.  HENT: Negative for hearing loss and neck pain.   Eyes: Negative for visual disturbance.  Respiratory: Negative for cough, chest tightness,  shortness of breath and wheezing.   Cardiovascular: Negative for chest pain, palpitations and leg swelling.  Gastrointestinal: Negative for nausea, vomiting, abdominal pain, diarrhea, constipation, blood in stool and abdominal distention.  Genitourinary: Negative for hematuria and difficulty urinating.  Musculoskeletal: Negative for myalgias and arthralgias.  Skin: Negative for rash.  Neurological: Negative for dizziness, seizures, syncope and headaches.  Hematological: Does not bruise/bleed easily.  Psychiatric/Behavioral: Negative for dysphoric mood. The patient is not nervous/anxious.       Objective:   Physical Exam  Nursing note and vitals reviewed. Constitutional: He is oriented to person, place, and time. He appears well-developed and well-nourished. No distress.  HENT:  Head: Normocephalic and atraumatic.  Right Ear: Hearing, tympanic membrane, external ear and ear canal normal.  Left Ear: Hearing, tympanic membrane, external ear and ear canal normal.  Nose: Nose normal. No mucosal edema or rhinorrhea.  Mouth/Throat: Uvula is midline, oropharynx is clear and moist and mucous membranes are normal. No oropharyngeal exudate, posterior oropharyngeal edema, posterior oropharyngeal erythema or tonsillar abscesses.  Eyes: Conjunctivae and EOM are normal. Pupils are equal, round, and reactive to light. No scleral icterus.  Neck: Normal range of motion. Neck supple. Carotid bruit is not present. No thyromegaly present.  Cardiovascular: Normal rate, regular rhythm, normal heart sounds and intact distal pulses.   No murmur heard. Pulses:      Radial pulses are 2+ on the right side, and 2+ on the left side.  Pulmonary/Chest: Effort normal and breath sounds normal. No respiratory distress. He has no wheezes. He has no rales.  Abdominal: Soft. Bowel sounds are normal. He exhibits no distension and no mass. There is no tenderness. There is no rebound and no guarding.  Genitourinary: Rectum  normal and prostate normal. Rectal exam shows no external hemorrhoid, no internal hemorrhoid, no fissure, no mass, no tenderness and anal tone normal. Guaiac negative stool. Prostate is not enlarged and not tender.  Musculoskeletal: Normal range of motion. He exhibits no edema.  Lymphadenopathy:    He has no cervical adenopathy.  Neurological: He is alert and oriented to person, place, and time.       CN grossly intact, station and gait intact  Skin: Skin is warm and dry. No rash noted.  Psychiatric: He has a normal mood and affect. His behavior is normal. Judgment and thought content normal.      Assessment & Plan:

## 2011-05-06 NOTE — Assessment & Plan Note (Signed)
Chronic, deteriorated. A1c up to 10.7%, prior 7's. Advised to keep log of fasting cbg and bring to next appt in 1 mo. Discussed addition of januvia as already maxed out on other meds. Pt requests 1 mo for diet changes prior to addition of new medicine. Will recheck A1c when returns.

## 2011-05-06 NOTE — Assessment & Plan Note (Signed)
Reviewed preventative protocols and updated unless pt declined. Pneumonia shot today. Colonoscopy 2004, wnl.  rpt due 2014.  Hemoccult neg today. PSA/DRE reassuring today.

## 2011-06-02 ENCOUNTER — Other Ambulatory Visit (INDEPENDENT_AMBULATORY_CARE_PROVIDER_SITE_OTHER): Payer: BC Managed Care – PPO

## 2011-06-02 DIAGNOSIS — E119 Type 2 diabetes mellitus without complications: Secondary | ICD-10-CM

## 2011-06-02 LAB — HEMOGLOBIN A1C: Hgb A1c MFr Bld: 9.8 % — ABNORMAL HIGH (ref 4.6–6.5)

## 2011-06-08 ENCOUNTER — Ambulatory Visit (INDEPENDENT_AMBULATORY_CARE_PROVIDER_SITE_OTHER): Payer: BC Managed Care – PPO | Admitting: Family Medicine

## 2011-06-08 ENCOUNTER — Encounter: Payer: Self-pay | Admitting: Family Medicine

## 2011-06-08 VITALS — BP 124/80 | HR 88 | Temp 98.4°F | Ht 68.0 in | Wt 213.8 lb

## 2011-06-08 DIAGNOSIS — E669 Obesity, unspecified: Secondary | ICD-10-CM

## 2011-06-08 DIAGNOSIS — E119 Type 2 diabetes mellitus without complications: Secondary | ICD-10-CM

## 2011-06-08 DIAGNOSIS — Z6825 Body mass index (BMI) 25.0-25.9, adult: Secondary | ICD-10-CM | POA: Insufficient documentation

## 2011-06-08 MED ORDER — SITAGLIPTIN PHOSPHATE 100 MG PO TABS: 100.0000 mg | ORAL_TABLET | Freq: Every day | ORAL | Status: DC

## 2011-06-08 NOTE — Progress Notes (Signed)
  Subjective:    Patient ID: Alan Morrison, male    DOB: 06/01/1950, 61 y.o.   MRN: [id number]  HPI CC: f/u DM  Seen here 04/2011 for CPE with a1c high at 10.7%.  Asked to reutrn this month for f/u diabetes.  DM - sugars running 160-170 (improvement from prior 180-190s.  checks once daily at different times throughout the day. No low sugars. Vision screen 01/2011. Foot exam due today.  Has lost 2 lbs since last month - wife trying to lose weight, has benefited from this.  Preparing more meals at home and bringing lunch to work.  More fruits, sandwiches.  Activity - no regular exercise.  No extra walking.  Obesity - Body mass index is 32.50 kg/(m^2).  Weight loss attributed to recent dietary changes.  Wt Readings from Last 3 Encounters:  06/08/11 213 lb 12 oz (96.956 kg)  05/06/11 215 lb 12 oz (97.864 kg)  07/30/10 222 lb 12 oz (101.039 kg)    Lab Results  Component Value Date   HGBA1C 9.8* 06/02/2011   No paresthesias.  No chest pain, SOB.    Review of Systems Pr HPI    Objective:   Physical Exam  Nursing note and vitals reviewed. Constitutional: He appears well-developed and well-nourished. No distress.  HENT:  Head: Normocephalic and atraumatic.  Right Ear: External ear normal.  Left Ear: External ear normal.  Nose: Nose normal.  Mouth/Throat: Oropharynx is clear and moist. No oropharyngeal exudate.  Eyes: Conjunctivae and EOM are normal. Pupils are equal, round, and reactive to light. No scleral icterus.  Neck: Normal range of motion. Neck supple.  Cardiovascular: Normal rate, regular rhythm, normal heart sounds and intact distal pulses.   No murmur heard. Pulmonary/Chest: Effort normal and breath sounds normal. No respiratory distress. He has no wheezes. He has no rales.  Musculoskeletal: He exhibits no edema.       Diabetic foot exam: Normal inspection.  Slight hallux valgus on right No skin breakdown No calluses  Normal DP/PT pulses Normal sensation to light tough  and monofilament Nails normal  Lymphadenopathy:    He has no cervical adenopathy.  Skin: Skin is warm and dry. No rash noted.  Psychiatric: He has a normal mood and affect.       Assessment & Plan:

## 2011-06-08 NOTE — Assessment & Plan Note (Signed)
Chronic, improved but still not at goal. Did not bring log. Discussed januvia vs GLP vs insulin. Pt opts to continue oral therapy currently.  Add januvia at 100mg  daily. rtc 4 mo for f/u Advised to monitor sugars closely to ensure no hypoglycemia with addition of DPP4-I.  If that happens will likely start by decreasing amaryl. Discussed common side effects of januvia and rare complications including pancreatitis

## 2011-06-08 NOTE — Patient Instructions (Addendum)
A1c improved but still high.  Start [place] once daily. Good to see you today Return in 4 months for follow up diabetes, prior fasting for blood work. Call us with questions. Keep eye on sugars to ensure no lows - may be able to back off amaryl if sugar too low - call me if this is the case.

## 2011-06-08 NOTE — Assessment & Plan Note (Signed)
Encouraged weight loss through continued dietary changes as well as recommended incorporate activity/exercise into routine.

## 2011-10-01 ENCOUNTER — Other Ambulatory Visit (INDEPENDENT_AMBULATORY_CARE_PROVIDER_SITE_OTHER): Payer: BC Managed Care – PPO

## 2011-10-01 DIAGNOSIS — E119 Type 2 diabetes mellitus without complications: Secondary | ICD-10-CM

## 2011-10-01 LAB — BASIC METABOLIC PANEL
Calcium: 9.8 mg/dL (ref 8.4–10.5)
Creatinine, Ser: 1.1 mg/dL (ref 0.4–1.5)
GFR: 69.57 mL/min (ref >60.00)
Sodium: 143 mEq/L (ref 135–145)

## 2011-10-08 ENCOUNTER — Encounter: Payer: Self-pay | Admitting: Family Medicine

## 2011-10-08 ENCOUNTER — Ambulatory Visit (INDEPENDENT_AMBULATORY_CARE_PROVIDER_SITE_OTHER): Payer: BC Managed Care – PPO | Admitting: Family Medicine

## 2011-10-08 VITALS — BP 140/85 | HR 75 | Temp 97.9°F | Wt 213.2 lb

## 2011-10-08 DIAGNOSIS — IMO0001 Reserved for inherently not codable concepts without codable children: Secondary | ICD-10-CM

## 2011-10-08 NOTE — Assessment & Plan Note (Signed)
Chronic, stable. Reviewed #s, great control with diet changes and addition of januvia. Too aggressive control given low cbgs recently with hypoglycemia. Decrease amaryl to qd.  If continued low sugars, update me for further titration.

## 2011-10-08 NOTE — Patient Instructions (Signed)
Decrease amaryl to once a day. Continue other diabetes meds. Great to see you today, great job with sugars!   Keep eye on sugars, if continued lows, call me.

## 2011-10-08 NOTE — Progress Notes (Signed)
  Subjective:    Patient ID: Alan Morrison, male    DOB: 1950-11-05, 61 y.o.   MRN: [id number]  HPI CC: f/u DM  DM - some hypoglycemia with addition of januvia (to 50s).  Usually mid afternoon, happens 2x/wk.  Has been having episodes of feeling shakey.  Sugars running great - fasting <100 and high of 148.   Lab Results  Component Value Date   HGBA1C 6.1 10/01/2011    Significantly changed diet.  Exercise limited by knee pain.  bp elevated today - forgot to take bp med last night.  Review of Systems Per HPI    Objective:   Physical Exam  Nursing note and vitals reviewed. Constitutional: He appears well-developed and well-nourished. No distress.  HENT:  Head: Normocephalic and atraumatic.  Mouth/Throat: Oropharynx is clear and moist. No oropharyngeal exudate.  Cardiovascular: Normal rate, regular rhythm, normal heart sounds and intact distal pulses.   No murmur heard. Pulmonary/Chest: Effort normal and breath sounds normal. No respiratory distress. He has no wheezes. He has no rales.  Musculoskeletal: He exhibits no edema.  Skin: Skin is warm and dry. No rash noted.  Psychiatric: He has a normal mood and affect.      Assessment & Plan:

## 2011-12-22 ENCOUNTER — Telehealth: Payer: Self-pay

## 2011-12-22 MED ORDER — SCOPOLAMINE 1 MG/3DAYS TD PT72: 1.0000 | MEDICATED_PATCH | TRANSDERMAL | Status: DC

## 2011-12-22 NOTE — Telephone Encounter (Signed)
plz notify sent in scopalamine patch.

## 2011-12-22 NOTE — Telephone Encounter (Signed)
Message left with male that Rx had been sent in as requested.

## 2011-12-22 NOTE — Telephone Encounter (Signed)
Going on cruise for six days; leaving 12/24/11. Request med prevent motion sickness to [facility]  [address]

## 2012-01-04 ENCOUNTER — Ambulatory Visit (INDEPENDENT_AMBULATORY_CARE_PROVIDER_SITE_OTHER): Payer: BC Managed Care – PPO | Admitting: Family Medicine

## 2012-01-04 ENCOUNTER — Encounter: Payer: Self-pay | Admitting: Family Medicine

## 2012-01-04 VITALS — BP 126/78 | HR 88 | Temp 97.8°F | Wt 212.0 lb

## 2012-01-04 DIAGNOSIS — R410 Disorientation, unspecified: Secondary | ICD-10-CM

## 2012-01-04 DIAGNOSIS — J40 Bronchitis, not specified as acute or chronic: Secondary | ICD-10-CM

## 2012-01-04 DIAGNOSIS — F29 Unspecified psychosis not due to a substance or known physiological condition: Secondary | ICD-10-CM

## 2012-01-04 DIAGNOSIS — IMO0001 Reserved for inherently not codable concepts without codable children: Secondary | ICD-10-CM

## 2012-01-04 LAB — POCT URINALYSIS DIPSTICK
Leukocytes, UA: NEGATIVE
Protein, UA: NEGATIVE
Urobilinogen, UA: 0.2
pH, UA: 8

## 2012-01-04 LAB — GLUCOSE, POCT (MANUAL RESULT ENTRY): POC Glucose: 72 mg/dl (ref 70–99)

## 2012-01-04 MED ORDER — AZITHROMYCIN 250 MG PO TABS: ORAL_TABLET | ORAL | Status: DC

## 2012-01-04 MED ORDER — GUAIFENESIN-CODEINE 100-10 MG/5ML PO SYRP: 5.0000 mL | ORAL_SOLUTION | Freq: Two times a day (BID) | ORAL | Status: DC | PRN

## 2012-01-04 NOTE — Assessment & Plan Note (Addendum)
Anticipate due to hypoglycemia from stress of travel, not regular meals, and recent URTI.   Non-focal neurological exam, not consistent with seizure or with CVA. Last A1c 6.1% - prior was very elevated. Discussed hypoglycemic plan. Check A1c again today - if remaining <6.5%, start by decreasing actos.  ==> cbg 72 today.  Will hold amaryl and decrease metfomrin until blood work returns.

## 2012-01-04 NOTE — Assessment & Plan Note (Signed)
Check a1c today.  Low threshold to decrease actos given longterm on 45mg  daily.

## 2012-01-04 NOTE — Assessment & Plan Note (Signed)
Anticipate viral as early on.  No red flags. See pt instructions. Treat with supportive care, cheratussin, may fill zpack if sxs not improving as expected or any worsening.

## 2012-01-04 NOTE — Progress Notes (Signed)
Subjective:    Patient ID: Alan Morrison, male    DOB: 01-Sep-1950, 61 y.o.   MRN: [id number]  HPI CC: AMS, malaise  Presents with wife who helps give story for parts that he does not remember.   61 yo with h/o T2DM, HTN, h/o urosepsis with hospitalization 2010, CAD, and HLD presents with 5d h/o malaise that started first with ST, then progressed to deep congestion.  This started while on cruise.  Saw ship doctor - dx with viral infection.  Since then, chest congestion worsening.  Frequent coughing somewhat productive, night time sweats.    Also noticing some confusion - trouble getting out of airplane.  Staggering out of airplane.  Glazed eyes.  Traveled from [place] to [place].  Does not remember [place] at all.  By time he got to [place], was feeling some better.  Still more irritable than prior.  No shakes or tremors noted.  ([place]).  Overall resp sxs getting some better.  This happened Sunday morning (yesterday).   Denies fevers/chills, abd pan, nausea/vomiting, diarrhea/constipation, ear or tooth pain.  No HA.  No facial droop, no unilateral weakness.  Lab Results  Component Value Date   HGBA1C 6.1 10/01/2011   Wt Readings from Last 3 Encounters:  01/04/12 212 lb (96.163 kg)  10/08/11 213 lb 4 oz (96.73 kg)  06/08/11 213 lb 12 oz (96.956 kg)    Past Medical History  Diagnosis Date  . Sleep apnea   . History of MRSA infection   . Dyspepsia   . CAD (coronary artery disease)   . GERD (gastroesophageal reflux disease)   . Heart murmur     Hx of  . Chest pain, atypical   . Headache   . Anemia, iron deficiency, inadequate dietary intake   . Cervical spondylosis without myelopathy   . Hyperuricemia   . Diabetes mellitus type II   . Hypercholesteremia   . Stress-induced cardiomyopathy 09/27/98    WNL, EF 64%  . Fatty liver   . History of cardiac catheterization 12/12/01    Min Dz, EF 65%, Adm.R/O'd  . History of MRI of brain and brain stem 09/06    . Urosepsis 01/01-01/05/10    Hospitalization  . Choroidal nevus 01/22/2011    left, yearly eye exam, no diabetic retinopathy  . Obesity      Review of Systems Per HPI    Objective:   Physical Exam  Nursing note and vitals reviewed. Constitutional: He is oriented to person, place, and time. He appears well-developed and well-nourished. No distress.  HENT:  Head: Normocephalic and atraumatic.  Right Ear: Hearing, tympanic membrane, external ear and ear canal normal.  Left Ear: Hearing, tympanic membrane, external ear and ear canal normal.  Nose: Nose normal. No mucosal edema or rhinorrhea. Right sinus exhibits no maxillary sinus tenderness and no frontal sinus tenderness. Left sinus exhibits no maxillary sinus tenderness and no frontal sinus tenderness.  Mouth/Throat: Uvula is midline, oropharynx is clear and moist and mucous membranes are normal. No oropharyngeal exudate, posterior oropharyngeal edema, posterior oropharyngeal erythema or tonsillar abscesses.  Eyes: Conjunctivae normal and EOM are normal. Pupils are equal, round, and reactive to light. No scleral icterus.  Neck: Normal range of motion. Neck supple.  Cardiovascular: Normal rate, regular rhythm, normal heart sounds and intact distal pulses.   No murmur heard. Pulmonary/Chest: Effort normal and breath sounds normal. No respiratory distress. He has no wheezes. He has no rales.       Lungs  clear  Musculoskeletal: He exhibits no edema.  Lymphadenopathy:    He has no cervical adenopathy.  Neurological: He is alert and oriented to person, place, and time. He has normal strength. No cranial nerve deficit or sensory deficit. He displays a negative Romberg sign. Coordination and gait normal.       CN 2-12 intact Normal FTN  Skin: Skin is warm and dry. No rash noted.       Assessment & Plan:

## 2012-01-04 NOTE — Patient Instructions (Addendum)
Sounds like you have an upper respiratory infection, possibly bronchitis but I think this is a viral process currently. Viral infections usually take 7-10 days to resolve.  The cough can last a few weeks to go away. Fill zpack if symptoms not improving as expected. cheratussin for cough at night. Push fluids and plenty of rest. Please return if you are not improving as expected, or if you have high fevers (>101.5) or difficulty swallowing or worsening productive cough. I think this respiratory illness, along with stress of travel and irregular meals led to low sugar episode which led to confusion and other symptoms yesterday. We will draw blood work today to check diabetes control, may recommend decreasing actos - we will call you with results. Call clinic with questions.  Good to see you today. Hold amaryl - decrease metformin to 500mg  bid until we call you with labwork.

## 2012-01-05 ENCOUNTER — Other Ambulatory Visit: Payer: Self-pay | Admitting: Family Medicine

## 2012-01-05 ENCOUNTER — Other Ambulatory Visit: Payer: Self-pay | Admitting: *Deleted

## 2012-01-05 LAB — BASIC METABOLIC PANEL
BUN: 23 mg/dL (ref 6–23)
CO2: 26 mEq/L (ref 19–32)
Calcium: 9.2 mg/dL (ref 8.4–10.5)
Creatinine, Ser: 1.1 mg/dL (ref 0.4–1.5)

## 2012-01-05 LAB — HEMOGLOBIN A1C: Hgb A1c MFr Bld: 6.2 % (ref 4.6–6.5)

## 2012-01-05 MED ORDER — GLIMEPIRIDE 2 MG PO TABS: 2.0000 mg | ORAL_TABLET | Freq: Every day | ORAL | Status: DC

## 2012-01-05 MED ORDER — PIOGLITAZONE HCL 30 MG PO TABS: 30.0000 mg | ORAL_TABLET | Freq: Every day | ORAL | Status: DC

## 2012-01-05 NOTE — Telephone Encounter (Signed)
plz phone in. 

## 2012-01-05 NOTE — Telephone Encounter (Signed)
Rx called in as directed.   

## 2012-01-05 NOTE — Addendum Note (Signed)
Addended by: Eustaquio Boyden on: 01/05/2012 11:34 AM   Modules accepted: Orders

## 2012-05-01 ENCOUNTER — Other Ambulatory Visit: Payer: Self-pay | Admitting: Family Medicine

## 2012-05-01 DIAGNOSIS — E119 Type 2 diabetes mellitus without complications: Secondary | ICD-10-CM

## 2012-05-01 DIAGNOSIS — Z125 Encounter for screening for malignant neoplasm of prostate: Secondary | ICD-10-CM

## 2012-05-01 DIAGNOSIS — E559 Vitamin D deficiency, unspecified: Secondary | ICD-10-CM

## 2012-05-01 DIAGNOSIS — I10 Essential (primary) hypertension: Secondary | ICD-10-CM

## 2012-05-01 DIAGNOSIS — D508 Other iron deficiency anemias: Secondary | ICD-10-CM

## 2012-05-02 ENCOUNTER — Other Ambulatory Visit (INDEPENDENT_AMBULATORY_CARE_PROVIDER_SITE_OTHER): Payer: BC Managed Care – PPO

## 2012-05-02 DIAGNOSIS — E119 Type 2 diabetes mellitus without complications: Secondary | ICD-10-CM

## 2012-05-02 DIAGNOSIS — D508 Other iron deficiency anemias: Secondary | ICD-10-CM

## 2012-05-02 DIAGNOSIS — Z125 Encounter for screening for malignant neoplasm of prostate: Secondary | ICD-10-CM

## 2012-05-02 DIAGNOSIS — I10 Essential (primary) hypertension: Secondary | ICD-10-CM

## 2012-05-02 LAB — MICROALBUMIN / CREATININE URINE RATIO
Creatinine,U: 163.4 mg/dL
Microalb Creat Ratio: 0.2 mg/g (ref 0.0–30.0)
Microalb, Ur: 0.4 mg/dL (ref 0.0–1.9)

## 2012-05-02 LAB — BASIC METABOLIC PANEL
GFR: 70.87 mL/min (ref >60.00)
Glucose, Bld: 103 mg/dL — ABNORMAL HIGH (ref 70–99)
Potassium: 5.3 mEq/L — ABNORMAL HIGH (ref 3.5–5.1)
Sodium: 142 mEq/L (ref 135–145)

## 2012-05-02 LAB — LIPID PANEL: VLDL: 6.6 mg/dL (ref 0.0–40.0)

## 2012-05-02 LAB — CBC WITH DIFFERENTIAL/PLATELET
Basophils Absolute: 0 10*3/uL (ref 0.0–0.1)
Eosinophils Absolute: 0.1 10*3/uL (ref 0.0–0.7)
Lymphocytes Relative: 18.3 % (ref 12.0–46.0)
MCHC: 32.8 g/dL (ref 30.0–36.0)
Neutrophils Relative %: 74 % (ref 43.0–77.0)
Platelets: 235 10*3/uL (ref 150.0–400.0)
RDW: 15.9 % — ABNORMAL HIGH (ref 11.5–14.6)

## 2012-05-02 LAB — IBC PANEL
Saturation Ratios: 10.7 % — ABNORMAL LOW (ref 20.0–50.0)
Transferrin: 367.8 mg/dL — ABNORMAL HIGH (ref 212.0–360.0)

## 2012-05-02 LAB — HEMOGLOBIN A1C: Hgb A1c MFr Bld: 6.6 % — ABNORMAL HIGH (ref 4.6–6.5)

## 2012-05-09 ENCOUNTER — Encounter: Payer: Self-pay | Admitting: Family Medicine

## 2012-05-09 ENCOUNTER — Ambulatory Visit (INDEPENDENT_AMBULATORY_CARE_PROVIDER_SITE_OTHER): Payer: BC Managed Care – PPO | Admitting: Family Medicine

## 2012-05-09 VITALS — BP 118/78 | HR 68 | Temp 97.8°F | Ht 68.0 in | Wt 219.5 lb

## 2012-05-09 DIAGNOSIS — I10 Essential (primary) hypertension: Secondary | ICD-10-CM

## 2012-05-09 DIAGNOSIS — E119 Type 2 diabetes mellitus without complications: Secondary | ICD-10-CM

## 2012-05-09 DIAGNOSIS — N401 Enlarged prostate with lower urinary tract symptoms: Secondary | ICD-10-CM

## 2012-05-09 DIAGNOSIS — Z1211 Encounter for screening for malignant neoplasm of colon: Secondary | ICD-10-CM

## 2012-05-09 DIAGNOSIS — Z0000 Encounter for general adult medical examination without abnormal findings: Secondary | ICD-10-CM

## 2012-05-09 DIAGNOSIS — N138 Other obstructive and reflux uropathy: Secondary | ICD-10-CM

## 2012-05-09 LAB — POC HEMOCCULT BLD/STL (OFFICE/1-CARD/DIAGNOSTIC): Fecal Occult Blood, POC: NEGATIVE

## 2012-05-09 MED ORDER — RAMIPRIL 5 MG PO CAPS: 5.0000 mg | ORAL_CAPSULE | Freq: Every day | ORAL | Status: DC

## 2012-05-09 MED ORDER — GLIMEPIRIDE 2 MG PO TABS: 2.0000 mg | ORAL_TABLET | Freq: Every day | ORAL | Status: DC

## 2012-05-09 MED ORDER — ATORVASTATIN CALCIUM 10 MG PO TABS: 10.0000 mg | ORAL_TABLET | Freq: Every day | ORAL | Status: DC

## 2012-05-09 MED ORDER — SITAGLIPTIN PHOSPHATE 100 MG PO TABS: 100.0000 mg | ORAL_TABLET | Freq: Every day | ORAL | Status: DC

## 2012-05-09 MED ORDER — METFORMIN HCL 1000 MG PO TABS: 1000.0000 mg | ORAL_TABLET | Freq: Two times a day (BID) | ORAL | Status: DC

## 2012-05-09 MED ORDER — PIOGLITAZONE HCL 30 MG PO TABS: 30.0000 mg | ORAL_TABLET | Freq: Every day | ORAL | Status: DC

## 2012-05-09 MED ORDER — PROBENECID 500 MG PO TABS: 500.0000 mg | ORAL_TABLET | Freq: Two times a day (BID) | ORAL | Status: DC

## 2012-05-09 MED ORDER — TAMSULOSIN HCL 0.4 MG PO CAPS: 0.4000 mg | ORAL_CAPSULE | Freq: Every day | ORAL | Status: DC

## 2012-05-09 NOTE — Assessment & Plan Note (Signed)
Refilled flomax

## 2012-05-09 NOTE — Assessment & Plan Note (Signed)
Chronic, stable as of last A1c.  Continue meds.

## 2012-05-09 NOTE — Assessment & Plan Note (Signed)
Chronic, stable 

## 2012-05-09 NOTE — Assessment & Plan Note (Signed)
Preventative protocols reviewed and updated unless pt declined. Discussed healthy diet and lifestyle. Encouraged increased activity. sent home with iFOB

## 2012-05-09 NOTE — Progress Notes (Signed)
Subjective:    Patient ID: Alan Morrison, male    DOB: April 08, 1951, 62 y.o.   MRN: [id number]  HPI [id number]   Wt Readings from Last 3 Encounters:  05/09/12 219 lb 8 oz (99.565 kg)  01/04/12 212 lb (96.163 kg)  10/08/11 213 lb 4 oz (96.73 kg)   DM - sugars running 140 in evenings.  Compliant with meds.  Last eye exam 01/2012.  Preventative:  Flu shot at work.  Never had PNA shot - would like today.  Shingles - discussed. Tetanus 2006. Colonoscopy - age 50yo, normal.  Rpt due 10 yrs.  No fmhx colon cancer. Prostate - last year normal. [facility] seen [facility] for low testosterone, told things looking ok. Would like to continue checking yearly.  Seat belt use and sunscreen use discussed.  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  Past histories reviewed and updated if relevant as below. Patient Active Problem List  Diagnosis  . OTHER SEPTICEMIA DUE TO GRAM-NEGATIVE ORGANISM  . Diabetes type 2, controlled  . TESTICULAR HYPOFUNCTION  . ANEMIA DUE TO DIETARY IRON DEFICIENCY  . CORONARY ATHEROSCLEROSIS NATIVE CORONARY ARTERY  . BENIGN PROSTATIC HYPERTROPHY, WITH URINARY OBSTRUCTION  . SLEEP APNEA  . HEART MURMUR, HX OF  . CHEST PAIN, ATYPICAL, HX OF  . ARTHROSCOPY, KNEE, HX OF  . PALPITATIONS, OCCASIONAL  . ESSENTIAL HYPERTENSION, BENIGN  . DYSPNEA  . Healthcare maintenance  . Obesity  . Confusion  . Bronchitis  . Vitamin D deficiency   Past Medical History  Diagnosis Date  . Sleep apnea   . History of MRSA infection   . Dyspepsia   . CAD (coronary artery disease) 2003    cath - Min Dz, EF 65%, Adm.R/O'd  . GERD (gastroesophageal reflux disease)   . Heart murmur     Hx of  . Chest pain, atypical   . Headache   . Anemia, iron deficiency, inadequate dietary intake   . Cervical spondylosis without myelopathy   .  Hyperuricemia   . Diabetes mellitus type II   . Hypercholesteremia   . Stress-induced cardiomyopathy 09/27/98    WNL, EF 64%  . Fatty liver   . History of MRI of brain and brain stem 09/06  . Urosepsis 01/01-01/05/10    Hospitalization  . Choroidal nevus 01/22/2011    left, yearly eye exam, no diabetic retinopathy  . Obesity    Past Surgical History  Procedure Date  . Knee arthroscopy   . Knee surgery 05/21/03    Right, Dr. August Saucer, med meniscus tear via MRI  . Myelogram 08/05    bulging disc   History  Substance Use Topics  . Smoking status: Former Games developer  . Smokeless tobacco: Never Used     Comment: quit over 20 years  . Alcohol Use: Yes     Comment: occassionally 1-2 beers a week   Family History  Problem Relation Age of Onset  . Heart disease Mother     CHF  . Heart disease Father     CHF  . Hypertension Father   . Migraines Brother     severe headaches from arsenic in the past from  wood that was treated on his deck  . Cancer Maternal Uncle     unsure  . Diabetes Mother   . Stroke Neg Hx    No Known Allergies Current Outpatient Prescriptions on File Prior  to Visit  Medication Sig Dispense Refill  . aspirin 81 MG tablet Take 81 mg by mouth daily.        [name] atorvastatin (LIPITOR) 10 MG tablet Take 1 tablet (10 mg total) by mouth daily. Take 1/2 by mouth at bedtime  90 tablet  3  . glimepiride (AMARYL) 2 MG tablet Take 1 tablet (2 mg total) by mouth daily.  90 tablet  3  . metFORMIN (GLUCOPHAGE) 1000 MG tablet Take 1 tablet (1,000 mg total) by mouth 2 (two) times daily.  180 tablet  3  . Multiple Vitamin (MULTIVITAMIN) tablet Take 1 tablet by mouth daily.        . pioglitazone (ACTOS) 30 MG tablet Take 1 tablet (30 mg total) by mouth daily.  90 tablet  3  . probenecid (BENEMID) 500 MG tablet Take 1 tablet (500 mg total) by mouth 2 (two) times daily.  180 tablet  3  . ramipril (ALTACE) 5 MG capsule Take 1 capsule (5 mg total) by mouth daily.  90 capsule  3  .  sitaGLIPtin (JANUVIA) 100 MG tablet Take 1 tablet (100 mg total) by mouth daily.  90 tablet  3     Review of Systems  Constitutional: Negative for fever, chills, activity change, appetite change, fatigue and unexpected weight change.  HENT: Negative for hearing loss and neck pain.   Eyes: Negative for visual disturbance.  Respiratory: Negative for cough, chest tightness, shortness of breath and wheezing.   Cardiovascular: Negative for chest pain, palpitations and leg swelling.  Gastrointestinal: Negative for nausea, vomiting, abdominal pain, diarrhea, constipation, blood in stool and abdominal distention.  Genitourinary: Negative for hematuria and difficulty urinating.  Musculoskeletal: Negative for myalgias and arthralgias.  Skin: Negative for rash.  Neurological: Negative for dizziness, seizures, syncope and headaches.  Hematological: Does not bruise/bleed easily.  Psychiatric/Behavioral: Negative for dysphoric mood. The patient is not nervous/anxious.        Objective:   Physical Exam  Nursing note and vitals reviewed. Constitutional: He is oriented to person, place, and time. He appears well-developed and well-nourished. No distress.  HENT:  Head: Normocephalic and atraumatic.  Right Ear: Hearing, tympanic membrane, external ear and ear canal normal.  Left Ear: Hearing, tympanic membrane, external ear and ear canal normal.  Nose: Nose normal.  Mouth/Throat: Oropharynx is clear and moist. No oropharyngeal exudate.  Eyes: Conjunctivae normal and EOM are normal. Pupils are equal, round, and reactive to light. No scleral icterus.  Neck: Normal range of motion. Neck supple. Carotid bruit is not present.  Cardiovascular: Normal rate, regular rhythm, normal heart sounds and intact distal pulses.   No murmur heard. Pulses:      Radial pulses are 2+ on the right side, and 2+ on the left side.  Pulmonary/Chest: Effort normal and breath sounds normal. No respiratory distress. He has no  wheezes. He has no rales.  Abdominal: Soft. Bowel sounds are normal. He exhibits no distension and no mass. There is no tenderness. There is no rebound and no guarding.  Genitourinary: Rectal exam shows external hemorrhoid (noninflamed). Rectal exam shows no internal hemorrhoid, no fissure, no mass, no tenderness and anal tone normal. Guaiac negative stool. Prostate is enlarged (20-25gm). Prostate is not tender.  Musculoskeletal: Normal range of motion. He exhibits no edema.  Lymphadenopathy:    He has no cervical adenopathy.  Neurological: He is alert and oriented to person, place, and time.       CN grossly intact, station and gait intact  Skin: Skin is warm and dry. No rash noted.  Psychiatric: He has a normal mood and affect. His behavior is normal. Judgment and thought content normal.       Assessment & Plan:

## 2012-05-09 NOTE — Patient Instructions (Signed)
Call your insurance about the shingles shot to see if it is covered or how much it would cost and where is cheaper (here or pharmacy).  If you want to receive here, call for nurse visit.  Pass by lab to pick up stool kit. Good to see you today, call us with questions. More water. More walking. Return in 6 months for diabetes follow up or as needed.

## 2012-05-23 ENCOUNTER — Encounter: Payer: Self-pay | Admitting: *Deleted

## 2012-05-23 ENCOUNTER — Other Ambulatory Visit: Payer: BC Managed Care – PPO

## 2012-05-23 DIAGNOSIS — Z1211 Encounter for screening for malignant neoplasm of colon: Secondary | ICD-10-CM

## 2012-07-20 ENCOUNTER — Telehealth: Payer: Self-pay

## 2012-07-20 DIAGNOSIS — E785 Hyperlipidemia, unspecified: Secondary | ICD-10-CM

## 2012-07-20 NOTE — Telephone Encounter (Signed)
[employer] faxed clarification form for instructions for atorvastatin; form in Dr Timoteo Expose box.

## 2012-07-21 DIAGNOSIS — E1169 Type 2 diabetes mellitus with other specified complication: Secondary | ICD-10-CM | POA: Insufficient documentation

## 2012-07-21 NOTE — Telephone Encounter (Signed)
Filled and placed in my out box. 

## 2012-10-27 ENCOUNTER — Other Ambulatory Visit: Payer: Self-pay

## 2012-10-30 ENCOUNTER — Other Ambulatory Visit: Payer: Self-pay | Admitting: Family Medicine

## 2012-10-30 DIAGNOSIS — E119 Type 2 diabetes mellitus without complications: Secondary | ICD-10-CM

## 2012-10-30 DIAGNOSIS — E611 Iron deficiency: Secondary | ICD-10-CM

## 2012-10-31 ENCOUNTER — Other Ambulatory Visit (INDEPENDENT_AMBULATORY_CARE_PROVIDER_SITE_OTHER): Payer: BC Managed Care – PPO

## 2012-10-31 DIAGNOSIS — D509 Iron deficiency anemia, unspecified: Secondary | ICD-10-CM

## 2012-10-31 DIAGNOSIS — E611 Iron deficiency: Secondary | ICD-10-CM

## 2012-10-31 DIAGNOSIS — E119 Type 2 diabetes mellitus without complications: Secondary | ICD-10-CM

## 2012-10-31 LAB — BASIC METABOLIC PANEL
BUN: 23 mg/dL (ref 6–23)
CO2: 26 mEq/L (ref 19–32)
Calcium: 9.9 mg/dL (ref 8.4–10.5)
Chloride: 105 mEq/L (ref 96–112)
Creatinine, Ser: 1 mg/dL (ref 0.4–1.5)

## 2012-10-31 LAB — CBC WITH DIFFERENTIAL/PLATELET
Basophils Relative: 0.4 % (ref 0.0–3.0)
Eosinophils Relative: 2.3 % (ref 0.0–5.0)
HCT: 50.3 % (ref 39.0–52.0)
Hemoglobin: 16.9 g/dL (ref 13.0–17.0)
Lymphs Abs: 1.4 10*3/uL (ref 0.7–4.0)
MCV: 89.7 fl (ref 78.0–100.0)
Monocytes Absolute: 0.4 10*3/uL (ref 0.1–1.0)
Monocytes Relative: 5.8 % (ref 3.0–12.0)
Neutro Abs: 5 10*3/uL (ref 1.4–7.7)
Platelets: 208 10*3/uL (ref 150.0–400.0)
WBC: 7 10*3/uL (ref 4.5–10.5)

## 2012-10-31 LAB — FERRITIN: Ferritin: 21.1 ng/mL — ABNORMAL LOW (ref 22.0–322.0)

## 2012-10-31 LAB — IBC PANEL: Iron: 88 ug/dL (ref 42–165)

## 2012-11-07 ENCOUNTER — Ambulatory Visit (INDEPENDENT_AMBULATORY_CARE_PROVIDER_SITE_OTHER): Payer: BC Managed Care – PPO | Admitting: Family Medicine

## 2012-11-07 DIAGNOSIS — I10 Essential (primary) hypertension: Secondary | ICD-10-CM

## 2012-11-07 DIAGNOSIS — E785 Hyperlipidemia, unspecified: Secondary | ICD-10-CM

## 2012-11-07 DIAGNOSIS — E119 Type 2 diabetes mellitus without complications: Secondary | ICD-10-CM

## 2012-11-07 NOTE — Progress Notes (Signed)
See scanned paper form.

## 2013-01-18 LAB — HM DIABETES EYE EXAM

## 2013-05-21 ENCOUNTER — Other Ambulatory Visit: Payer: Self-pay | Admitting: Family Medicine

## 2013-05-21 DIAGNOSIS — N138 Other obstructive and reflux uropathy: Secondary | ICD-10-CM

## 2013-05-21 DIAGNOSIS — E119 Type 2 diabetes mellitus without complications: Secondary | ICD-10-CM

## 2013-05-21 DIAGNOSIS — E785 Hyperlipidemia, unspecified: Secondary | ICD-10-CM

## 2013-05-21 DIAGNOSIS — E611 Iron deficiency: Secondary | ICD-10-CM

## 2013-05-21 DIAGNOSIS — I10 Essential (primary) hypertension: Secondary | ICD-10-CM

## 2013-05-21 DIAGNOSIS — Z125 Encounter for screening for malignant neoplasm of prostate: Secondary | ICD-10-CM

## 2013-05-21 DIAGNOSIS — N401 Enlarged prostate with lower urinary tract symptoms: Secondary | ICD-10-CM

## 2013-05-22 ENCOUNTER — Other Ambulatory Visit (INDEPENDENT_AMBULATORY_CARE_PROVIDER_SITE_OTHER): Payer: BC Managed Care – PPO

## 2013-05-22 DIAGNOSIS — E119 Type 2 diabetes mellitus without complications: Secondary | ICD-10-CM

## 2013-05-22 DIAGNOSIS — Z125 Encounter for screening for malignant neoplasm of prostate: Secondary | ICD-10-CM

## 2013-05-22 DIAGNOSIS — E785 Hyperlipidemia, unspecified: Secondary | ICD-10-CM

## 2013-05-22 LAB — PSA: PSA: 2.71 ng/mL (ref 0.10–4.00)

## 2013-05-22 LAB — BASIC METABOLIC PANEL
BUN: 20 mg/dL (ref 6–23)
CHLORIDE: 104 meq/L (ref 96–112)
CO2: 29 mEq/L (ref 19–32)
Calcium: 10 mg/dL (ref 8.4–10.5)
Creatinine, Ser: 0.9 mg/dL (ref 0.4–1.5)
GFR: 88.62 mL/min (ref >60.00)
Glucose, Bld: 185 mg/dL — ABNORMAL HIGH (ref 70–99)
Potassium: 5.2 mEq/L — ABNORMAL HIGH (ref 3.5–5.1)
Sodium: 139 mEq/L (ref 135–145)

## 2013-05-22 LAB — LIPID PANEL
CHOLESTEROL: 140 mg/dL (ref 0–200)
HDL: 35.6 mg/dL — AB (ref >39.00)
LDL Cholesterol: 92 mg/dL (ref 0–99)
TRIGLYCERIDES: 60 mg/dL (ref 0.0–149.0)
Total CHOL/HDL Ratio: 4
VLDL: 12 mg/dL (ref 0.0–40.0)

## 2013-05-22 LAB — HEMOGLOBIN A1C: HEMOGLOBIN A1C: 8.5 % — AB (ref 4.6–6.5)

## 2013-05-23 ENCOUNTER — Encounter: Payer: Self-pay | Admitting: Family Medicine

## 2013-05-26 ENCOUNTER — Ambulatory Visit (INDEPENDENT_AMBULATORY_CARE_PROVIDER_SITE_OTHER): Payer: BC Managed Care – PPO | Admitting: Family Medicine

## 2013-05-26 ENCOUNTER — Encounter: Payer: Self-pay | Admitting: Family Medicine

## 2013-05-26 VITALS — BP 124/82 | HR 88 | Temp 97.7°F | Ht 68.0 in | Wt 214.2 lb

## 2013-05-26 DIAGNOSIS — E1165 Type 2 diabetes mellitus with hyperglycemia: Secondary | ICD-10-CM

## 2013-05-26 DIAGNOSIS — E785 Hyperlipidemia, unspecified: Secondary | ICD-10-CM

## 2013-05-26 DIAGNOSIS — I10 Essential (primary) hypertension: Secondary | ICD-10-CM

## 2013-05-26 DIAGNOSIS — N138 Other obstructive and reflux uropathy: Secondary | ICD-10-CM

## 2013-05-26 DIAGNOSIS — Z1211 Encounter for screening for malignant neoplasm of colon: Secondary | ICD-10-CM

## 2013-05-26 DIAGNOSIS — IMO0001 Reserved for inherently not codable concepts without codable children: Secondary | ICD-10-CM

## 2013-05-26 DIAGNOSIS — N401 Enlarged prostate with lower urinary tract symptoms: Secondary | ICD-10-CM

## 2013-05-26 DIAGNOSIS — IMO0002 Reserved for concepts with insufficient information to code with codable children: Secondary | ICD-10-CM

## 2013-05-26 DIAGNOSIS — Z0000 Encounter for general adult medical examination without abnormal findings: Secondary | ICD-10-CM

## 2013-05-26 MED ORDER — SITAGLIPTIN PHOSPHATE 100 MG PO TABS: 100.0000 mg | ORAL_TABLET | Freq: Every day | ORAL | Status: DC

## 2013-05-26 MED ORDER — PIOGLITAZONE HCL 30 MG PO TABS: 30.0000 mg | ORAL_TABLET | Freq: Every day | ORAL | Status: DC

## 2013-05-26 MED ORDER — TAMSULOSIN HCL 0.4 MG PO CAPS: 0.4000 mg | ORAL_CAPSULE | Freq: Every day | ORAL | Status: DC

## 2013-05-26 MED ORDER — GLIMEPIRIDE 2 MG PO TABS: 2.0000 mg | ORAL_TABLET | Freq: Every day | ORAL | Status: DC

## 2013-05-26 MED ORDER — RAMIPRIL 5 MG PO CAPS: 5.0000 mg | ORAL_CAPSULE | Freq: Every day | ORAL | Status: DC

## 2013-05-26 MED ORDER — PROBENECID 500 MG PO TABS: 500.0000 mg | ORAL_TABLET | Freq: Two times a day (BID) | ORAL | Status: DC

## 2013-05-26 MED ORDER — METFORMIN HCL 1000 MG PO TABS: 1000.0000 mg | ORAL_TABLET | Freq: Two times a day (BID) | ORAL | Status: DC

## 2013-05-26 MED ORDER — ATORVASTATIN CALCIUM 10 MG PO TABS: 5.0000 mg | ORAL_TABLET | Freq: Every day | ORAL | Status: DC

## 2013-05-26 NOTE — Assessment & Plan Note (Signed)
Chronic, mild, controlled on ramipril.

## 2013-05-26 NOTE — Assessment & Plan Note (Signed)
Preventative protocols reviewed and updated unless pt declined. Discussed healthy diet and lifestyle.  iFOB today.  Pt will check on zostavax with insurance.

## 2013-05-26 NOTE — Assessment & Plan Note (Signed)
Reviewed elevated A1c - will monitor diet more closely and return in 3 mo for f/u visit Discussed natural progression of diabetes and eventual need for insulin.

## 2013-05-26 NOTE — Progress Notes (Signed)
BP 124/82  Pulse 88  Temp(Src) 97.7 F (36.5 C) (Oral)  Ht 5\' 8"  (1.727 m)  Wt 214 lb 4 oz (97.183 kg)  BMI 32.58 kg/m2   CC: annual exam  Subjective:    Patient ID: Alan Morrison, male    DOB: 1950/07/17, 63 y.o.   MRN: [id number]  HPI: Alan Morrison is a 63 y.o. male presenting on 05/26/2013 with Annual Exam  DM - deteriorated control.  regularly does check sugars 125 fasting and 140 random (once or twice a week).  Compliant with antihyperglycemic regimen which includes: actos 30mg , amaryl 2mg , metformin 1000mg  bid, and januvia 100mg .  Denies low sugars or hypoglycemic symptoms.  Occasional mild foot paresthesias. Last diabetic eye exam 01/2013.  Pneumovax: 2013.    Lab Results  Component Value Date   HGBA1C 8.5* 05/22/2013    BPH - on flomax.  HLD - compliant with lipitor 5mg  daily (1/2 tab of 10mg )  Preventative:  Colonoscopy - age 3yo, normal. Would like continued stool kits.  No fmhx colon cancer.  Prostate - last year normal. [facility] for low testosterone, told things looking ok. Would like to continue checking yearly Flu shot at work.  Pneumovax 2013 Tetanus 2006.  Shingles - will check with insurance.  Seat belt use and sunscreen use discussed.   Lives with wife  Occupation: equipment maintenance  Activity: no regular exercise - hunts  Diet: some water, daily fruits/vegetables, diet drinks  Relevant past medical, surgical, family and social history reviewed and updated. Allergies and medications reviewed and updated. Current Outpatient Prescriptions on File Prior to Visit  Medication Sig  . aspirin 81 MG tablet Take 81 mg by mouth daily.    . Multiple Vitamin (MULTIVITAMIN) tablet Take 1 tablet by mouth daily.     No current facility-administered medications on file prior to visit.    Review of Systems  Constitutional: Negative for fever, chills, activity change, appetite change, fatigue and unexpected weight change.  HENT: Negative for  hearing loss.   Eyes: Negative for visual disturbance.  Respiratory: Negative for cough, chest tightness, shortness of breath and wheezing.   Cardiovascular: Negative for chest pain, palpitations and leg swelling.  Gastrointestinal: Negative for nausea, vomiting, abdominal pain, diarrhea, constipation, blood in stool and abdominal distention.  Genitourinary: Negative for hematuria and difficulty urinating.  Musculoskeletal: Negative for arthralgias, myalgias and neck pain.       Bilat knee pain  Skin: Negative for rash.  Neurological: Negative for dizziness, seizures, syncope and headaches.  Hematological: Negative for adenopathy. Does not bruise/bleed easily.  Psychiatric/Behavioral: Negative for dysphoric mood. The patient is not nervous/anxious.    Per HPI unless specifically indicated above    Objective:    BP 124/82  Pulse 88  Temp(Src) 97.7 F (36.5 C) (Oral)  Ht 5\' 8"  (1.727 m)  Wt 214 lb 4 oz (97.183 kg)  BMI 32.58 kg/m2  Physical Exam  Nursing note and vitals reviewed. Constitutional: He is oriented to person, place, and time. He appears well-developed and well-nourished. No distress.  HENT:  Head: Normocephalic and atraumatic.  Right Ear: Hearing, tympanic membrane, external ear and ear canal normal.  Left Ear: Hearing, tympanic membrane, external ear and ear canal normal.  Nose: Nose normal.  Mouth/Throat: Uvula is midline, oropharynx is clear and moist and mucous membranes are normal. No oropharyngeal exudate, posterior oropharyngeal edema or posterior oropharyngeal erythema.  Eyes: Conjunctivae and EOM are normal. Pupils are equal, round, and reactive to light. No  scleral icterus.  Neck: Normal range of motion. Neck supple. Carotid bruit is not present.  Cardiovascular: Normal rate, regular rhythm, normal heart sounds and intact distal pulses.   No murmur heard. Pulses:      Radial pulses are 2+ on the right side, and 2+ on the left side.  Pulmonary/Chest: Effort  normal and breath sounds normal. No respiratory distress. He has no wheezes. He has no rales.  Abdominal: Soft. Bowel sounds are normal. He exhibits no distension and no mass. There is no tenderness. There is no rebound and no guarding.  Genitourinary: Rectum normal. Rectal exam shows no external hemorrhoid, no internal hemorrhoid, no fissure, no mass, no tenderness and anal tone normal. Prostate is enlarged (mild, 20gm). Prostate is not tender.  Musculoskeletal: Normal range of motion. He exhibits no edema.  Lymphadenopathy:    He has no cervical adenopathy.  Neurological: He is alert and oriented to person, place, and time.  CN grossly intact, station and gait intact  Skin: Skin is warm and dry. No rash noted.  Psychiatric: He has a normal mood and affect. His behavior is normal. Judgment and thought content normal.   Results for orders placed in visit on 05/26/13  [facility] DIABETES EYE EXAM      Result Value Range   [facility] Diabetic Eye Exam per patient         Assessment & Plan:   Problem List Items Addressed This Visit   BENIGN PROSTATIC HYPERTROPHY, WITH URINARY OBSTRUCTION     Continue flomax.  Mild BPH.    Relevant Medications      tamsulosin (FLOMAX) 0.4 MG CAPS capsule   Diabetes type 2, uncontrolled     Reviewed elevated A1c - will monitor diet more closely and return in 3 mo for f/u visit Discussed natural progression of diabetes and eventual need for insulin.    Relevant Medications      atorvastatin (LIPITOR) tablet      glimepiride (AMARYL) tablet      metFORMIN (GLUCOPHAGE) tablet      pioglitazone (ACTOS) tablet      ramipril (ALTACE) capsule      sitaGLIPtin (JANUVIA) tablet   Essential hypertension, benign     Chronic, mild, controlled on ramipril.    Healthcare maintenance - Primary     Preventative protocols reviewed and updated unless pt declined. Discussed healthy diet and lifestyle.  iFOB today.  Pt will check on zostavax with insurance.    HLD  (hyperlipidemia)     Compliant with lipitor without myalgias.     Other Visit Diagnoses   Special screening for malignant neoplasms, colon        Relevant Orders       Fecal occult blood, imunochemical        Follow up plan: Return in about 3 months (around 08/23/2013), or as needed, for diabetes follow up.

## 2013-05-26 NOTE — Patient Instructions (Addendum)
Call your insurance about the shingles shot to see if it is covered or how much it would cost and where is cheaper (here or pharmacy).  If you want to receive here, call for nurse visit. I've refilled all your medicines. Pass by lab for a stool kit. Return in 3 months for diabetes follow up, prior for blood work.  Over the next 3 months work on healthy diet changes. Look into www.diabetes.org for the American Diabetes Association website with further diabetic resources

## 2013-05-26 NOTE — Assessment & Plan Note (Signed)
Compliant with lipitor without myalgias.

## 2013-05-26 NOTE — Progress Notes (Signed)
Pre-visit discussion using our clinic review tool. No additional management support is needed unless otherwise documented below in the visit note.  

## 2013-05-26 NOTE — Assessment & Plan Note (Signed)
Continue flomax.  Mild BPH.

## 2013-05-29 ENCOUNTER — Telehealth: Payer: Self-pay | Admitting: Family Medicine

## 2013-05-29 NOTE — Telephone Encounter (Signed)
Relevant patient education mailed to patient.  

## 2013-06-05 ENCOUNTER — Other Ambulatory Visit: Payer: BC Managed Care – PPO

## 2013-06-05 DIAGNOSIS — Z1211 Encounter for screening for malignant neoplasm of colon: Secondary | ICD-10-CM

## 2013-06-05 LAB — FECAL OCCULT BLOOD, IMMUNOCHEMICAL: Fecal Occult Bld: POSITIVE — AB

## 2013-06-07 ENCOUNTER — Other Ambulatory Visit: Payer: Self-pay | Admitting: Family Medicine

## 2013-06-07 DIAGNOSIS — R195 Other fecal abnormalities: Secondary | ICD-10-CM

## 2013-06-09 ENCOUNTER — Encounter: Payer: Self-pay | Admitting: Gastroenterology

## 2013-07-06 ENCOUNTER — Ambulatory Visit (AMBULATORY_SURGERY_CENTER): Payer: Self-pay | Admitting: *Deleted

## 2013-07-06 VITALS — Ht 68.0 in | Wt 214.8 lb

## 2013-07-06 DIAGNOSIS — Z1211 Encounter for screening for malignant neoplasm of colon: Secondary | ICD-10-CM

## 2013-07-06 MED ORDER — NA SULFATE-K SULFATE-MG SULF 17.5-3.13-1.6 GM/177ML PO SOLN: 1.0000 | Freq: Once | ORAL | Status: DC

## 2013-07-06 NOTE — Progress Notes (Signed)
No allergies to eggs or soy. No problems with anesthesia.  

## 2013-07-12 ENCOUNTER — Encounter: Payer: Self-pay | Admitting: Gastroenterology

## 2013-07-19 HISTORY — PX: COLONOSCOPY: SHX174

## 2013-07-20 ENCOUNTER — Encounter: Payer: Self-pay | Admitting: Gastroenterology

## 2013-07-20 ENCOUNTER — Ambulatory Visit (AMBULATORY_SURGERY_CENTER): Payer: BC Managed Care – PPO | Admitting: Gastroenterology

## 2013-07-20 VITALS — BP 122/94 | HR 70 | Temp 96.6°F | Resp 13 | Ht 68.0 in | Wt 214.0 lb

## 2013-07-20 DIAGNOSIS — D126 Benign neoplasm of colon, unspecified: Secondary | ICD-10-CM

## 2013-07-20 DIAGNOSIS — Z1211 Encounter for screening for malignant neoplasm of colon: Secondary | ICD-10-CM

## 2013-07-20 LAB — GLUCOSE, CAPILLARY
GLUCOSE-CAPILLARY: 107 mg/dL — AB (ref 70–99)
Glucose-Capillary: 107 mg/dL — ABNORMAL HIGH (ref 70–99)

## 2013-07-20 MED ORDER — SODIUM CHLORIDE 0.9 % IV SOLN: 500.0000 mL | INTRAVENOUS | Status: DC

## 2013-07-20 NOTE — Patient Instructions (Signed)
Discharge instructions given with verbal understanding. Handout on polyps. Resume previous medications. YOU HAD AN ENDOSCOPIC PROCEDURE TODAY AT THE  ENDOSCOPY CENTER: Refer to the procedure report that was given to you for any specific questions about what was found during the examination.  If the procedure report does not answer your questions, please call your gastroenterologist to clarify.  If you requested that your care partner not be given the details of your procedure findings, then the procedure report has been included in a sealed envelope for you to review at your convenience later.  YOU SHOULD EXPECT: Some feelings of bloating in the abdomen. Passage of more gas than usual.  Walking can help get rid of the air that was put into your GI tract during the procedure and reduce the bloating. If you had a lower endoscopy (such as a colonoscopy or flexible sigmoidoscopy) you may notice spotting of blood in your stool or on the toilet paper. If you underwent a bowel prep for your procedure, then you may not have a normal bowel movement for a few days.  DIET: Your first meal following the procedure should be a light meal and then it is ok to progress to your normal diet.  A half-sandwich or bowl of soup is an example of a good first meal.  Heavy or fried foods are harder to digest and may make you feel nauseous or bloated.  Likewise meals heavy in dairy and vegetables can cause extra gas to form and this can also increase the bloating.  Drink plenty of fluids but you should avoid alcoholic beverages for 24 hours.  ACTIVITY: Your care partner should take you home directly after the procedure.  You should plan to take it easy, moving slowly for the rest of the day.  You can resume normal activity the day after the procedure however you should NOT DRIVE or use heavy machinery for 24 hours (because of the sedation medicines used during the test).    SYMPTOMS TO REPORT IMMEDIATELY: A  gastroenterologist can be reached at any hour.  During normal business hours, 8:30 AM to 5:00 PM Monday through Friday, call [phone].  After hours and on weekends, please call the GI answering service at [phone] who will take a message and have the physician on call contact you.   Following lower endoscopy (colonoscopy or flexible sigmoidoscopy):  Excessive amounts of blood in the stool  Significant tenderness or worsening of abdominal pains  Swelling of the abdomen that is new, acute  Fever of 100F or higher  FOLLOW UP: If any biopsies were taken you will be contacted by phone or by letter within the next 1-3 weeks.  Call your gastroenterologist if you have not heard about the biopsies in 3 weeks.  Our staff will call the home number listed on your records the next business day following your procedure to check on you and address any questions or concerns that you may have at that time regarding the information given to you following your procedure. This is a courtesy call and so if there is no answer at the home number and we have not heard from you through the emergency physician on call, we will assume that you have returned to your regular daily activities without incident.  SIGNATURES/CONFIDENTIALITY: You and/or your care partner have signed paperwork which will be entered into your electronic medical record.  These signatures attest to the fact that that the information above on your After Visit Summary has been   reviewed and is understood.  Full responsibility of the confidentiality of this discharge information lies with you and/or your care-partner. 

## 2013-07-20 NOTE — Progress Notes (Signed)
Called to room to assist during endoscopic procedure.  Patient ID and intended procedure confirmed with present staff. Received instructions for my participation in the procedure from the performing physician.  

## 2013-07-20 NOTE — Progress Notes (Signed)
Report to pacu rn, vss, bbs=clear 

## 2013-07-20 NOTE — Op Note (Addendum)
[place]  [address] [place], [id number]   COLONOSCOPY PROCEDURE REPORT  PATIENT: Hillman, Attig  MR#: [id number] BIRTHDATE: [birth year] , 63  yrs. old GENDER: Male ENDOSCOPIST: Inda Castle, MD REFERRED [id number] Danise Mina, M.D. PROCEDURE DATE:  07/20/2013 PROCEDURE:   Colonoscopy with cold biopsy polypectomy First Screening Colonoscopy - Avg.  risk and is 50 yrs.  old or older - No.  Prior Negative Screening - Now for repeat screening. 10 or more years since last screening  History of Adenoma - Now for follow-up colonoscopy & has been > or = to 3 yrs.  N/A  Polyps Removed Today? Yes. ASA CLASS:   Class II INDICATIONS:Average risk patient for colon cancer. MEDICATIONS: MAC sedation, administered by CRNA and Propofol (Diprivan) 230 mg IV  DESCRIPTION OF PROCEDURE:   After the risks benefits and alternatives of the procedure were thoroughly explained, informed consent was obtained.  A digital rectal exam revealed no abnormalities of the rectum.   The [id number]  endoscope was introduced through the anus and advanced to the cecum, which was identified by both the appendix and ileocecal valve. No adverse events experienced.   The quality of the prep was excellent using Suprep  The instrument was then slowly withdrawn as the colon was fully examined.      COLON FINDINGS: A sessile polyp measuring 2 mm in size was found in the sigmoid colon.  A polypectomy was performed with cold forceps. The colon was otherwise normal.  There was no diverticulosis, inflammation, polyps or cancers unless previously stated. Retroflexed views revealed no abnormalities. The time to cecum=3 minutes 26 seconds.  Withdrawal time=9 minutes 42 seconds.  The scope was withdrawn and the procedure completed. COMPLICATIONS: There were no complications.  ENDOSCOPIC IMPRESSION: 1.   Sessile polyp measuring 2 mm in size was found in the sigmoid colon; polypectomy was  performed with cold forceps 2.   The colon was otherwise normal  RECOMMENDATIONS: If the polyp(s) removed today are proven to be adenomatous (pre-cancerous) polyps, you will need a repeat colonoscopy in 5 years.  Otherwise you should continue to follow colorectal cancer screening guidelines for "routine risk" patients with colonoscopy in 10 years.  You will receive a letter within 1-2 weeks with the results of your biopsy as well as final recommendations.  Please call my office if you have not received a letter after 3 weeks.   eSigned:  Inda Castle, MD 07/20/2013 10:00 AM Revised: 07/20/2013 10:00 AM  cc:   PATIENT NAME:  Labib, Cwynar MR#: [id number]

## 2013-07-22 ENCOUNTER — Encounter (HOSPITAL_COMMUNITY): Payer: Self-pay | Admitting: Emergency Medicine

## 2013-07-22 ENCOUNTER — Emergency Department (INDEPENDENT_AMBULATORY_CARE_PROVIDER_SITE_OTHER): Payer: BC Managed Care – PPO

## 2013-07-22 ENCOUNTER — Emergency Department (INDEPENDENT_AMBULATORY_CARE_PROVIDER_SITE_OTHER)
Admission: EM | Admit: 2013-07-22 | Discharge: 2013-07-22 | Disposition: A | Discharge status: Home / Self Care | Payer: BC Managed Care – PPO | Source: Home / Self Care | Attending: Family Medicine | Admitting: Family Medicine

## 2013-07-22 DIAGNOSIS — S61209A Unspecified open wound of unspecified finger without damage to nail, initial encounter: Secondary | ICD-10-CM

## 2013-07-22 DIAGNOSIS — S61218A Laceration without foreign body of other finger without damage to nail, initial encounter: Secondary | ICD-10-CM

## 2013-07-22 NOTE — ED Notes (Signed)
Pt  Reports  He  Sustained  A  laceration  To  His  l  Pointer  Finger  Today    By  A      Saw   bleeding  Has  Subsided        rom is  Limited    Pt   Wife  Is with  Him   And  He  Is  Awake and  Alert  And  Oriented

## 2013-07-22 NOTE — Discharge Instructions (Signed)
Keep clean and dry until tapes come off, may remove in 7-10 days., return as needed.

## 2013-07-22 NOTE — ED Provider Notes (Signed)
CSN: [id number]     Arrival date & time 07/22/13  1420 History   First MD Initiated Contact with Patient 07/22/13 1431     Chief Complaint  Patient presents with  . Laceration   (Consider location/radiation/quality/duration/timing/severity/associated sxs/prior Treatment) Patient is a 63 y.o. male presenting with skin laceration. The history is provided by the patient and the spouse.  Laceration Location:  Finger Finger laceration location:  L index finger Length (cm):  1.5 Depth:  Cutaneous Quality: jagged   Bleeding: controlled   Time since incident:  1 hour Injury mechanism: hack saw cut finger tip. Pain details:    Progression:  Unchanged Tetanus status:  Up to date   Past Medical History  Diagnosis Date  . Sleep apnea   . History of MRSA infection   . Dyspepsia   . CAD (coronary artery disease) 2003    cath - Min Dz, EF 65%, Adm.R/O'd  . GERD (gastroesophageal reflux disease)   . Heart murmur     Hx of  . Chest pain, atypical   . Headache(784.0)   . Anemia, iron deficiency, inadequate dietary intake   . Cervical spondylosis without myelopathy   . Hyperuricemia   . Diabetes mellitus type II   . HLD (hyperlipidemia)   . Stress-induced cardiomyopathy 09/27/98    WNL, EF 64%  . Fatty liver   . History of MRI of brain and brain stem 09/06  . Urosepsis 01/01-01/05/10    Hospitalization  . Choroidal nevus 01/22/2011    left, yearly eye exam, no diabetic retinopathy  . Obesity    Past Surgical History  Procedure Laterality Date  . Knee arthroscopy Left 1988  . Knee surgery Right 05/21/03    Right, Dr. Marlou Sa, med meniscus tear via MRI  . Myelogram  08/05    bulging disc   Family History  Problem Relation Age of Onset  . Heart disease Mother     CHF  . Diabetes Mother   . Heart disease Father     CHF  . Hypertension Father   . Migraines Brother     severe headaches from arsenic in the past from  wood that was treated on his deck  . Cancer Maternal Uncle      unsure  . Stroke Neg Hx   . Colon cancer Neg Hx    History  Substance Use Topics  . Smoking status: Former Smoker    Quit date: 04/20/1974  . Smokeless tobacco: Never Used     Comment: quit over 20 years  . Alcohol Use: 1.8 oz/week    3 Cans of beer per week    Review of Systems  Constitutional: Negative.   Skin: Positive for wound.    Allergies  Review of patient's allergies indicates no known allergies.  Home Medications   Current Outpatient Rx  Name  Route  Sig  Dispense  Refill  . aspirin 81 MG tablet   Oral   Take 81 mg by mouth daily.           [name] atorvastatin (LIPITOR) 10 MG tablet   Oral   Take 0.5 tablets (5 mg total) by mouth daily.   45 tablet   3   . glimepiride (AMARYL) 2 MG tablet   Oral   Take 1 tablet (2 mg total) by mouth daily.   90 tablet   3   . metFORMIN (GLUCOPHAGE) 1000 MG tablet   Oral   Take 1 tablet (1,000 mg total) by mouth  2 (two) times daily.   180 tablet   3   . Multiple Vitamin (MULTIVITAMIN) tablet   Oral   Take 1 tablet by mouth daily.           . pioglitazone (ACTOS) 30 MG tablet   Oral   Take 1 tablet (30 mg total) by mouth daily.   90 tablet   3   . probenecid (BENEMID) 500 MG tablet   Oral   Take 1 tablet (500 mg total) by mouth 2 (two) times daily.   180 tablet   3   . ramipril (ALTACE) 5 MG capsule   Oral   Take 1 capsule (5 mg total) by mouth daily.   90 capsule   3   . sitaGLIPtin (JANUVIA) 100 MG tablet   Oral   Take 1 tablet (100 mg total) by mouth daily.   90 tablet   3   . tamsulosin (FLOMAX) 0.4 MG CAPS capsule   Oral   Take 1 capsule (0.4 mg total) by mouth daily.   90 capsule   3    BP 142/86  Pulse 72  Temp(Src) 98.6 F (37 C) (Oral)  Resp 18  SpO2 100% Physical Exam  Nursing note and vitals reviewed. Constitutional: He is oriented to person, place, and time. He appears well-developed and well-nourished. No distress.  Musculoskeletal: He exhibits no tenderness.   Neurological: He is alert and oriented to person, place, and time.  Skin: Skin is warm and dry.  1.5 cm jagged lac to distal phalanx of lif thru edge of nail. nvt intact, bleeding controlled.    ED Course  LACERATION REPAIR Date/Time: 07/22/2013 3:33 PM Performed by: Billy Fischer Authorized by: Ihor Gully D Consent: Verbal consent obtained. Risks and benefits: risks, benefits and alternatives were discussed Consent given by: patient Body area: upper extremity Location details: left index finger Laceration length: 1.5 cm Contamination: The wound is contaminated. Foreign bodies: metal Tendon involvement: none Nerve involvement: none Vascular damage: no Preparation: Patient was prepped and draped in the usual sterile fashion. Irrigation solution: tap water Irrigation method: tap Amount of cleaning: standard Debridement: minimal Degree of undermining: none Skin closure: Steri-Strips Approximation: close Approximation difficulty: simple Patient tolerance: Patient tolerated the procedure well with no immediate complications.   (including critical care time) Labs Review Labs Reviewed - No data to display Imaging Review Dg Finger Index Left  07/22/2013   CLINICAL DATA:  Pain post trauma  EXAM: LEFT SECOND FINGER 2+V  COMPARISON:  None.  FINDINGS: Frontal, oblique, and lateral views were obtained. No fracture or dislocation. There is a tiny radiopaque foreign body which appears quite close to the skin surface along the lateral, volar aspect of the distal second digit. There is no other radiopaque foreign body. Joint spaces appear intact.  IMPRESSION: Tiny radiopaque foreign body along the vol are, lateral aspect of the second distal phalanx. No bony abnormality. No fracture or dislocation.   Electronically Signed   By: Lowella Grip M.D.   On: 07/22/2013 15:07   X-rays reviewed and report per radiologist.   MDM   1. Laceration of finger, index        Billy Fischer,  MD 07/22/13 1540

## 2013-07-24 ENCOUNTER — Telehealth: Payer: Self-pay | Admitting: *Deleted

## 2013-07-24 NOTE — Telephone Encounter (Signed)
  Follow up Call-  Call back number 07/20/2013  Post procedure Call Back phone  # [phone]  Permission to leave phone message Yes     Patient questions:  Do you have a fever, pain , or abdominal swelling? no Pain Score  0 *  Have you tolerated food without any problems? yes  Have you been able to return to your normal activities? yes  Do you have any questions about your discharge instructions: Diet   no Medications  no Follow up visit  no  Do you have questions or concerns about your Care? no  Actions: * If pain score is 4 or above: No action needed, pain <4.

## 2013-07-28 ENCOUNTER — Encounter: Payer: Self-pay | Admitting: Gastroenterology

## 2013-07-29 ENCOUNTER — Encounter: Payer: Self-pay | Admitting: Family Medicine

## 2013-08-03 ENCOUNTER — Telehealth: Payer: Self-pay | Admitting: Family Medicine

## 2013-08-03 NOTE — Telephone Encounter (Signed)
Patient had called regarding infected finger.  Left voice mail x 2 to call office.

## 2013-08-21 ENCOUNTER — Other Ambulatory Visit (INDEPENDENT_AMBULATORY_CARE_PROVIDER_SITE_OTHER): Payer: BC Managed Care – PPO

## 2013-08-21 ENCOUNTER — Other Ambulatory Visit: Payer: Self-pay | Admitting: Family Medicine

## 2013-08-21 DIAGNOSIS — IMO0001 Reserved for inherently not codable concepts without codable children: Secondary | ICD-10-CM

## 2013-08-21 DIAGNOSIS — E785 Hyperlipidemia, unspecified: Secondary | ICD-10-CM

## 2013-08-21 DIAGNOSIS — E1165 Type 2 diabetes mellitus with hyperglycemia: Secondary | ICD-10-CM

## 2013-08-21 DIAGNOSIS — IMO0002 Reserved for concepts with insufficient information to code with codable children: Secondary | ICD-10-CM

## 2013-08-21 LAB — MICROALBUMIN / CREATININE URINE RATIO
CREATININE, U: 143.5 mg/dL
Microalb Creat Ratio: 0.5 mg/g (ref 0.0–30.0)
Microalb, Ur: 0.7 mg/dL (ref 0.0–1.9)

## 2013-08-21 LAB — BASIC METABOLIC PANEL
BUN: 24 mg/dL — ABNORMAL HIGH (ref 6–23)
CO2: 24 mEq/L (ref 19–32)
Calcium: 9.9 mg/dL (ref 8.4–10.5)
Chloride: 108 mEq/L (ref 96–112)
Creatinine, Ser: 1 mg/dL (ref 0.4–1.5)
GFR: 82.32 mL/min (ref >60.00)
GLUCOSE: 114 mg/dL — AB (ref 70–99)
POTASSIUM: 4.2 meq/L (ref 3.5–5.1)
Sodium: 140 mEq/L (ref 135–145)

## 2013-08-21 LAB — HEMOGLOBIN A1C: HEMOGLOBIN A1C: 7.1 % — AB (ref 4.6–6.5)

## 2013-08-23 ENCOUNTER — Ambulatory Visit (INDEPENDENT_AMBULATORY_CARE_PROVIDER_SITE_OTHER): Payer: BC Managed Care – PPO | Admitting: Family Medicine

## 2013-08-23 ENCOUNTER — Encounter: Payer: Self-pay | Admitting: Family Medicine

## 2013-08-23 VITALS — BP 130/80 | HR 76 | Temp 97.7°F | Wt 215.0 lb

## 2013-08-23 DIAGNOSIS — IMO0002 Reserved for concepts with insufficient information to code with codable children: Secondary | ICD-10-CM

## 2013-08-23 DIAGNOSIS — E1165 Type 2 diabetes mellitus with hyperglycemia: Secondary | ICD-10-CM

## 2013-08-23 DIAGNOSIS — Z23 Encounter for immunization: Secondary | ICD-10-CM

## 2013-08-23 DIAGNOSIS — IMO0001 Reserved for inherently not codable concepts without codable children: Secondary | ICD-10-CM

## 2013-08-23 MED ORDER — GLUCOSE BLOOD VI STRP: ORAL_STRIP | Status: DC

## 2013-08-23 NOTE — Assessment & Plan Note (Signed)
Significant improvement with more regular adherence to meds as well as monitoring diet more closely. Congratulated, continue current meds. rtc 6 mo for f/u.  rtc 9 mo for CPE. Let up to patient - if good control of sugars, ok to return at 9 mo for physical.

## 2013-08-23 NOTE — Progress Notes (Signed)
Pre visit review using our clinic review tool, if applicable. No additional management support is needed unless otherwise documented below in the visit note. 

## 2013-08-23 NOTE — Progress Notes (Signed)
BP 130/80  Pulse 76  Temp(Src) 97.7 F (36.5 C) (Oral)  Wt 215 lb (97.523 kg)   CC: 3 mo f/u  Subjective:    Patient ID: Alan Morrison, male    DOB: [birth year], 63 y.o.   MRN: [id number]  HPI: Alan Morrison is a 64 y.o. male presenting on 08/23/2013 for Follow-up   Had hematoma after blood draw this week - on left dorsal hand  Checked with insurance - would like shingles vaccine today.  Recent L index finger laceration complicated by infection treated with abx.  Healing well.  Saw [facility] x2.  DM - deteriorated control recently with A1c at CPE earlier this year 8.5%. Checking sugar every morning, this morning after breakfast 157.  Yesterday morning 113. Compliant with antihyperglycemic regimen which includes: actos 30mg , amaryl 2mg , metformin 1000mg  bid, and januvia 100mg . Denies low sugars or hypoglycemic symptoms. Occasional mild foot paresthesias. Last diabetic eye exam 01/2013. Pneumovax: 2013. Actually since last visit, he has been more regular with medication use. Also has tried to "eat better" stopping diet sodas and increasing water. Increasing salads as well. Walking around yard some. Lab Results  Component Value Date   HGBA1C 7.1* 08/21/2013   Wt Readings from Last 3 Encounters:  08/23/13 215 lb (97.523 kg)  07/20/13 214 lb (97.07 kg)  07/06/13 214 lb 12.8 oz (97.433 kg)    Relevant past medical, surgical, family and social history reviewed and updated as indicated.  Allergies and medications reviewed and updated. Current Outpatient Prescriptions on File Prior to Visit  Medication Sig  . aspirin 81 MG tablet Take 81 mg by mouth daily.    [name] atorvastatin (LIPITOR) 10 MG tablet Take 0.5 tablets (5 mg total) by mouth daily.  [name] glimepiride (AMARYL) 2 MG tablet Take 1 tablet (2 mg total) by mouth daily.  . metFORMIN (GLUCOPHAGE) 1000 MG tablet Take 1 tablet (1,000 mg total) by mouth 2 (two) times daily.  . Multiple Vitamin (MULTIVITAMIN) tablet Take 1 tablet by mouth daily.      . pioglitazone (ACTOS) 30 MG tablet Take 1 tablet (30 mg total) by mouth daily.  . probenecid (BENEMID) 500 MG tablet Take 1 tablet (500 mg total) by mouth 2 (two) times daily.  . ramipril (ALTACE) 5 MG capsule Take 1 capsule (5 mg total) by mouth daily.  . sitaGLIPtin (JANUVIA) 100 MG tablet Take 1 tablet (100 mg total) by mouth daily.  . tamsulosin (FLOMAX) 0.4 MG CAPS capsule Take 1 capsule (0.4 mg total) by mouth daily.   No current facility-administered medications on file prior to visit.    Review of Systems Per HPI unless specifically indicated above    Objective:    BP 130/80  Pulse 76  Temp(Src) 97.7 F (36.5 C) (Oral)  Wt 215 lb (97.523 kg)  Physical Exam  Nursing note and vitals reviewed. Constitutional: He appears well-developed and well-nourished. No distress.  HENT:  Head: Normocephalic and atraumatic.  Right Ear: External ear normal.  Left Ear: External ear normal.  Nose: Nose normal.  Mouth/Throat: Oropharynx is clear and moist. No oropharyngeal exudate.  Eyes: Conjunctivae and EOM are normal. Pupils are equal, round, and reactive to light. No scleral icterus.  Neck: Normal range of motion. Neck supple.  Cardiovascular: Normal rate, regular rhythm, normal heart sounds and intact distal pulses.   No murmur heard. Pulmonary/Chest: Effort normal and breath sounds normal. No respiratory distress. He has no wheezes. He has no rales.  Musculoskeletal: He exhibits no edema.  Diabetic foot exam: Normal inspection No skin breakdown Early callus right medial great toe Normal DP/PT pulses Normal sensation to light touch and monofilament Nails normal  Lymphadenopathy:    He has no cervical adenopathy.  Skin: Skin is warm and dry. No rash noted.  Psychiatric: He has a normal mood and affect.   Results for orders placed in visit on 08/21/13  HEMOGLOBIN A1C      Result Value Ref Range   Hemoglobin A1C 7.1 (*) 4.6 - 6.5 %  BASIC METABOLIC PANEL      Result Value  Ref Range   Sodium 140  135 - 145 mEq/L   Potassium 4.2  3.5 - 5.1 mEq/L   Chloride 108  96 - 112 mEq/L   CO2 24  19 - 32 mEq/L   Glucose, Bld 114 (*) 70 - 99 mg/dL   BUN 24 (*) 6 - 23 mg/dL   Creatinine, Ser 1.0  0.4 - 1.5 mg/dL   Calcium 9.9  8.4 - 10.5 mg/dL   GFR 82.32  >60.00 mL/min  MICROALBUMIN / CREATININE URINE RATIO      Result Value Ref Range   Microalb, Ur 0.7  0.0 - 1.9 mg/dL   Creatinine,U 143.5     Microalb Creat Ratio 0.5  0.0 - 30.0 mg/g      Assessment & Plan:  zostavax today.  Problem List Items Addressed This Visit   Diabetes type 2, uncontrolled - Primary     Significant improvement with more regular adherence to meds as well as monitoring diet more closely. Congratulated, continue current meds. rtc 6 mo for f/u.  rtc 9 mo for CPE. Let up to patient - if good control of sugars, ok to return at 9 mo for physical.        Follow up plan: Return in about 6 months (around 02/23/2014), or as needed, for follow up visit.

## 2013-08-23 NOTE — Patient Instructions (Addendum)
Good job with bringing sugars back under control. Return as needed or in 6 months for f/u diabetes or in 9 months for physical. Shingles shot today.

## 2013-08-23 NOTE — Addendum Note (Signed)
Addended by: Royann Shivers A on: 08/23/2013 07:43 AM   Modules accepted: Orders

## 2013-08-29 ENCOUNTER — Telehealth: Payer: Self-pay

## 2013-08-29 NOTE — Telephone Encounter (Signed)
Relevant patient education mailed to patient.  

## 2013-12-10 ENCOUNTER — Emergency Department (HOSPITAL_COMMUNITY)
Admission: EM | Admit: 2013-12-10 | Discharge: 2013-12-10 | Disposition: A | Discharge status: Home / Self Care | Payer: BC Managed Care – PPO | Attending: Emergency Medicine | Admitting: Emergency Medicine

## 2013-12-10 ENCOUNTER — Emergency Department (HOSPITAL_COMMUNITY): Payer: BC Managed Care – PPO

## 2013-12-10 ENCOUNTER — Encounter (HOSPITAL_COMMUNITY): Payer: Self-pay | Admitting: Emergency Medicine

## 2013-12-10 DIAGNOSIS — E669 Obesity, unspecified: Secondary | ICD-10-CM | POA: Insufficient documentation

## 2013-12-10 DIAGNOSIS — E785 Hyperlipidemia, unspecified: Secondary | ICD-10-CM | POA: Diagnosis not present

## 2013-12-10 DIAGNOSIS — Z8614 Personal history of Methicillin resistant Staphylococcus aureus infection: Secondary | ICD-10-CM | POA: Insufficient documentation

## 2013-12-10 DIAGNOSIS — Z8739 Personal history of other diseases of the musculoskeletal system and connective tissue: Secondary | ICD-10-CM | POA: Diagnosis not present

## 2013-12-10 DIAGNOSIS — Z8719 Personal history of other diseases of the digestive system: Secondary | ICD-10-CM | POA: Insufficient documentation

## 2013-12-10 DIAGNOSIS — R011 Cardiac murmur, unspecified: Secondary | ICD-10-CM | POA: Diagnosis not present

## 2013-12-10 DIAGNOSIS — Z79899 Other long term (current) drug therapy: Secondary | ICD-10-CM | POA: Insufficient documentation

## 2013-12-10 DIAGNOSIS — I2510 Atherosclerotic heart disease of native coronary artery without angina pectoris: Secondary | ICD-10-CM | POA: Diagnosis not present

## 2013-12-10 DIAGNOSIS — Z87891 Personal history of nicotine dependence: Secondary | ICD-10-CM | POA: Insufficient documentation

## 2013-12-10 DIAGNOSIS — E119 Type 2 diabetes mellitus without complications: Secondary | ICD-10-CM | POA: Insufficient documentation

## 2013-12-10 DIAGNOSIS — Z9889 Other specified postprocedural states: Secondary | ICD-10-CM | POA: Diagnosis not present

## 2013-12-10 DIAGNOSIS — H81399 Other peripheral vertigo, unspecified ear: Secondary | ICD-10-CM | POA: Diagnosis not present

## 2013-12-10 DIAGNOSIS — Z8619 Personal history of other infectious and parasitic diseases: Secondary | ICD-10-CM | POA: Insufficient documentation

## 2013-12-10 DIAGNOSIS — Z862 Personal history of diseases of the blood and blood-forming organs and certain disorders involving the immune mechanism: Secondary | ICD-10-CM | POA: Diagnosis not present

## 2013-12-10 DIAGNOSIS — Z9189 Other specified personal risk factors, not elsewhere classified: Secondary | ICD-10-CM | POA: Insufficient documentation

## 2013-12-10 DIAGNOSIS — Z7982 Long term (current) use of aspirin: Secondary | ICD-10-CM | POA: Insufficient documentation

## 2013-12-10 DIAGNOSIS — R1110 Vomiting, unspecified: Secondary | ICD-10-CM | POA: Diagnosis present

## 2013-12-10 LAB — BASIC METABOLIC PANEL
Anion gap: 11 (ref 5–15)
BUN: 28 mg/dL — ABNORMAL HIGH (ref 6–23)
CALCIUM: 8.9 mg/dL (ref 8.4–10.5)
CO2: 24 mEq/L (ref 19–32)
CREATININE: 0.93 mg/dL (ref 0.50–1.35)
Chloride: 105 mEq/L (ref 96–112)
GFR calc non Af Amer: 88 mL/min — ABNORMAL LOW (ref >90)
Glucose, Bld: 165 mg/dL — ABNORMAL HIGH (ref 70–99)
Potassium: 4.5 mEq/L (ref 3.7–5.3)
Sodium: 140 mEq/L (ref 137–147)

## 2013-12-10 LAB — CBC WITH DIFFERENTIAL/PLATELET
BASOS PCT: 1 % (ref 0–1)
Basophils Absolute: 0 10*3/uL (ref 0.0–0.1)
EOS ABS: 0.1 10*3/uL (ref 0.0–0.7)
EOS PCT: 1 % (ref 0–5)
HCT: 44.8 % (ref 39.0–52.0)
HEMOGLOBIN: 15.7 g/dL (ref 13.0–17.0)
Lymphocytes Relative: 11 % — ABNORMAL LOW (ref 12–46)
Lymphs Abs: 0.9 10*3/uL (ref 0.7–4.0)
MCH: 30.4 pg (ref 26.0–34.0)
MCHC: 35 g/dL (ref 30.0–36.0)
MCV: 86.8 fL (ref 78.0–100.0)
Monocytes Absolute: 0.4 10*3/uL (ref 0.1–1.0)
Monocytes Relative: 5 % (ref 3–12)
NEUTROS PCT: 82 % — AB (ref 43–77)
Neutro Abs: 7.1 10*3/uL (ref 1.7–7.7)
Platelets: 160 10*3/uL (ref 150–400)
RBC: 5.16 MIL/uL (ref 4.22–5.81)
RDW: 13.4 % (ref 11.5–15.5)
WBC: 8.5 10*3/uL (ref 4.0–10.5)

## 2013-12-10 LAB — URINALYSIS, ROUTINE W REFLEX MICROSCOPIC
BILIRUBIN URINE: NEGATIVE
GLUCOSE, UA: NEGATIVE mg/dL
HGB URINE DIPSTICK: NEGATIVE
Ketones, ur: NEGATIVE mg/dL
Nitrite: NEGATIVE
Protein, ur: NEGATIVE mg/dL
Specific Gravity, Urine: 1.014 (ref 1.005–1.030)
UROBILINOGEN UA: 0.2 mg/dL (ref 0.0–1.0)
pH: 5 (ref 5.0–8.0)

## 2013-12-10 LAB — URINE MICROSCOPIC-ADD ON

## 2013-12-10 LAB — CBG MONITORING, ED: Glucose-Capillary: 154 mg/dL — ABNORMAL HIGH (ref 70–99)

## 2013-12-10 MED ORDER — SODIUM CHLORIDE 0.9 % IV BOLUS (SEPSIS)
1000.0000 mL | Freq: Once | INTRAVENOUS | Status: AC
  Administered 2013-12-10: 1000 mL via INTRAVENOUS

## 2013-12-10 MED ORDER — DIAZEPAM 5 MG/ML IJ SOLN
2.5000 mg | Freq: Once | INTRAMUSCULAR | Status: AC
  Administered 2013-12-10: 2.5 mg via INTRAVENOUS
  Filled 2013-12-10: qty 2

## 2013-12-10 MED ORDER — GADOBENATE DIMEGLUMINE 529 MG/ML IV SOLN
20.0000 mL | Freq: Once | INTRAVENOUS | Status: AC | PRN
  Administered 2013-12-10: 20 mL via INTRAVENOUS

## 2013-12-10 MED ORDER — ONDANSETRON HCL 4 MG/2ML IJ SOLN
4.0000 mg | Freq: Once | INTRAMUSCULAR | Status: AC
  Administered 2013-12-10: 4 mg via INTRAVENOUS
  Filled 2013-12-10: qty 2

## 2013-12-10 MED ORDER — ONDANSETRON 4 MG PO TBDP: 4.0000 mg | ORAL_TABLET | Freq: Three times a day (TID) | ORAL | Status: DC | PRN

## 2013-12-10 MED ORDER — DIAZEPAM 5 MG PO TABS: 5.0000 mg | ORAL_TABLET | Freq: Four times a day (QID) | ORAL | Status: DC | PRN

## 2013-12-10 NOTE — ED Provider Notes (Signed)
TIME SEEN: 7:22 AM  CHIEF COMPLAINT: Vertigo, blurry vision  HPI: Patient is a 63 y.o. M with history of hypertension, diabetes, hyperlipidemia, prior history of tobacco use who presents to the emergency department with sensation of vertigo and blurry vision that started this morning. He states that yesterday morning when he woke up he did fill dizzy with standing. He states that that was his only episode of dizziness yesterday. He describes it more as a lightheaded feeling. He reports this morning when he woke up from sleep and sat up he felt the room was spinning and he felt very nauseous. He states he does not notice a change in his vertigo with changes in position. He has also noticed blurry vision that started when he woke up this morning. No tinnitus, hearing loss or ear pain. No fevers, cough, chest pain or shortness of breath, vomiting or diarrhea, bloody stool or melena, numbness or focal weakness. No prior history of CVA or TIA. No prior history of vertigo.  ROS: See HPI Constitutional: no fever  Eyes: no drainage  ENT: no runny nose   Cardiovascular:  no chest pain  Resp: no SOB  GI: no vomiting GU: no dysuria Integumentary: no rash  Allergy: no hives  Musculoskeletal: no leg swelling  Neurological: no slurred speech ROS otherwise negative  PAST MEDICAL HISTORY/PAST SURGICAL HISTORY:  Past Medical History  Diagnosis Date  . Sleep apnea   . History of MRSA infection   . Dyspepsia   . CAD (coronary artery disease) 2003    cath - Min Dz, EF 65%, Adm.R/O'd  . GERD (gastroesophageal reflux disease)   . Heart murmur     Hx of  . Chest pain, atypical   . Headache(784.0)   . Anemia, iron deficiency, inadequate dietary intake   . Cervical spondylosis without myelopathy   . Hyperuricemia   . Diabetes mellitus type II   . HLD (hyperlipidemia)   . Stress-induced cardiomyopathy 09/27/98    WNL, EF 64%  . Fatty liver   . History of MRI of brain and brain stem 09/06  . Urosepsis  01/01-01/05/10    Hospitalization  . Choroidal nevus 01/22/2011    left, yearly eye exam, no diabetic retinopathy  . Obesity     MEDICATIONS:  Prior to Admission medications   Medication Sig Start Date End Date Taking? Authorizing Provider  aspirin 81 MG tablet Take 81 mg by mouth daily.      Historical Provider, MD  atorvastatin (LIPITOR) 10 MG tablet Take 0.5 tablets (5 mg total) by mouth daily. 05/26/13   [name], MD  glimepiride (AMARYL) 2 MG tablet Take 1 tablet (2 mg total) by mouth daily. 05/26/13   [name], MD  glucose blood (ONE TOUCH ULTRA TEST) test strip Use as instructed 08/23/13   [name], MD  metFORMIN (GLUCOPHAGE) 1000 MG tablet Take 1 tablet (1,000 mg total) by mouth 2 (two) times daily. 05/26/13   [name], MD  Multiple Vitamin (MULTIVITAMIN) tablet Take 1 tablet by mouth daily.      Historical Provider, MD  pioglitazone (ACTOS) 30 MG tablet Take 1 tablet (30 mg total) by mouth daily. 05/26/13   [name], MD  probenecid (BENEMID) 500 MG tablet Take 1 tablet (500 mg total) by mouth 2 (two) times daily. 05/26/13   [name], MD  ramipril (ALTACE) 5 MG capsule Take 1 capsule (5 mg total) by mouth daily. 05/26/13   [name], MD  sitaGLIPtin (JANUVIA) 100 MG tablet  Take 1 tablet (100 mg total) by mouth daily. 05/26/13 06/12/15  [name], MD  tamsulosin (FLOMAX) 0.4 MG CAPS capsule Take 1 capsule (0.4 mg total) by mouth daily. 05/26/13   [name], MD    ALLERGIES:  No Known Allergies  SOCIAL HISTORY:  History  Substance Use Topics  . Smoking status: Former Smoker    Quit date: 04/20/1974  . Smokeless tobacco: Never Used     Comment: quit over 20 years  . Alcohol Use: 1.8 oz/week    3 Cans of beer per week    FAMILY HISTORY: Family History  Problem Relation Age of Onset  . Heart disease Mother     CHF  . Diabetes Mother   . Heart disease Father     CHF  . Hypertension Father   . Migraines Brother      severe headaches from arsenic in the past from  wood that was treated on his deck  . Cancer Maternal Uncle     unsure  . Stroke Neg Hx   . Colon cancer Neg Hx     EXAM: BP 129/74  Pulse 74  Temp(Src) 98.2 F (36.8 C) (Oral)  Resp 14  SpO2 100% CONSTITUTIONAL: Alert and oriented and responds appropriately to questions. Well-appearing; well-nourished HEAD: Normocephalic EYES: Conjunctivae clear, PERRL, extraocular movements intact, no reproducible nystagmus ENT: normal nose; no rhinorrhea; moist mucous membranes; pharynx without lesions noted, TMs are clear bilaterally NECK: Supple, no meningismus, no LAD  CARD: RRR; S1 and S2 appreciated; no murmurs, no clicks, no rubs, no gallops RESP: Normal chest excursion without splinting or tachypnea; breath sounds clear and equal bilaterally; no wheezes, no rhonchi, no rales,  ABD/GI: Normal bowel sounds; non-distended; soft, non-tender, no rebound, no guarding BACK:  The back appears normal and is non-tender to palpation, there is no CVA tenderness EXT: Normal ROM in all joints; non-tender to palpation; no edema; normal capillary refill; no cyanosis    SKIN: Normal color for age and race; warm NEURO: Moves all extremities equally, sensation to touch intact diffusely, cranial nerves II through XII intact, no dysmetria finger to nose testing, normal rapid alternating movements, Dix-Hallpike is negative, normal visual fields PSYCH: The patient's mood and manner are appropriate. Grooming and personal hygiene are appropriate.  MEDICAL DECISION MAKING: Patient here with vertigo and blurry vision. He has multiple risk factors for CVA. He would be outside of any TPA window given he woke up with symptoms. I am unable to reproduce his symptoms with changing in position. Given this, I am concerned for possible central vertigo. We'll obtain an MRI of his brain for further evaluation. Will check basic labs, urine. Will give IV fluids, Zofran and Valium and  reassess.  ED PROGRESS: Patient's labs are unremarkable. Urine shows trace leukocytes but no other sign of infection. MRI of his brain and MRA of his neck completely normal. He states his vertigo has improved with IV Valium. He states he still having mild vertigo. We'll discharge with Valium, Zofran. We'll give instructions on how to perform Epley maneuver at home. Discussed with patient if symptoms persist, recommend followup with his primary care physician. Patient verbalizes understanding and is comfortable with plan. Have discussed strict return precautions.     EKG Interpretation  Date/Time:  Sunday December 10 2013 07:03:53 EDT Ventricular Rate:  73 PR Interval:  205 QRS Duration: 108 QT Interval:  361 QTC Calculation: 398 R Axis:   23 Text Interpretation:  Sinus rhythm Low voltage, precordial leads  Consider anterior infarct Minimal ST elevation, inferior leads No reciprocal changes No old tracing to compare Confirmed by WARD,  DO, KRISTEN [phone]) on 12/10/2013 9:22:21 AM        [place], DO 12/10/13 1459

## 2013-12-10 NOTE — ED Notes (Signed)
Pt arrives via EMS with dizziness yesterday upon waking, experienced same this morning, N/V. No hx of vertigo, inner ear problems. CBG 187. 20 G IV placed in L hand

## 2013-12-10 NOTE — Discharge Instructions (Signed)
Vertigo Vertigo means you feel like you or your surroundings are moving when they are not. Vertigo can be dangerous if it occurs when you are at work, driving, or performing difficult activities.  CAUSES  Vertigo occurs when there is a conflict of signals sent to your brain from the visual and sensory systems in your body. There are many different causes of vertigo, including:  Infections, especially in the inner ear.  A bad reaction to a drug or misuse of alcohol and medicines.  Withdrawal from drugs or alcohol.  Rapidly changing positions, such as lying down or rolling over in bed.  A migraine headache.  Decreased blood flow to the brain.  Increased pressure in the brain from a head injury, infection, tumor, or bleeding. SYMPTOMS  You may feel as though the world is spinning around or you are falling to the ground. Because your balance is upset, vertigo can cause nausea and vomiting. You may have involuntary eye movements (nystagmus). DIAGNOSIS  Vertigo is usually diagnosed by physical exam. If the cause of your vertigo is unknown, your caregiver may perform imaging tests, such as an MRI scan (magnetic resonance imaging). TREATMENT  Most cases of vertigo resolve on their own, without treatment. Depending on the cause, your caregiver may prescribe certain medicines. If your vertigo is related to body position issues, your caregiver may recommend movements or procedures to correct the problem. In rare cases, if your vertigo is caused by certain inner ear problems, you may need surgery. HOME CARE INSTRUCTIONS   Follow your caregiver's instructions.  Avoid driving.  Avoid operating heavy machinery.  Avoid performing any tasks that would be dangerous to you or others during a vertigo episode.  Tell your caregiver if you notice that certain medicines seem to be causing your vertigo. Some of the medicines used to treat vertigo episodes can actually make them worse in some people. SEEK  IMMEDIATE MEDICAL CARE IF:   Your medicines do not relieve your vertigo or are making it worse.  You develop problems with talking, walking, weakness, or using your arms, hands, or legs.  You develop severe headaches.  Your nausea or vomiting continues or gets worse.  You develop visual changes.  A family member notices behavioral changes.  Your condition gets worse. MAKE SURE YOU:  Understand these instructions.  Will watch your condition.  Will get help right away if you are not doing well or get worse. Document Released: 01/14/2005 Document Revised: 06/29/2011 Document Reviewed: 10/23/2010 [facility] Patient Information 2015 [place], [place]. This information is not intended to replace advice given to you by your health care provider. Make sure you discuss any questions you have with your health care provider.  Epley Maneuver Self-Care WHAT IS THE EPLEY MANEUVER? The Epley maneuver is an exercise you can do to relieve symptoms of benign paroxysmal positional vertigo (BPPV). This condition is often just referred to as vertigo. BPPV is caused by the movement of tiny crystals (canaliths) inside your inner ear. The accumulation and movement of canaliths in your inner ear causes a sudden spinning sensation (vertigo) when you move your head to certain positions. Vertigo usually lasts about 30 seconds. BPPV usually occurs in just one ear. If you get vertigo when you lie on your left side, you probably have BPPV in your left ear. Your health care provider can tell you which ear is involved.  BPPV may be caused by a head injury. Many people older than 50 get BPPV for unknown reasons. If you have  been diagnosed with BPPV, your health care provider may teach you how to do this maneuver. BPPV is not life threatening (benign) and usually goes away in time.  WHEN SHOULD I PERFORM THE EPLEY MANEUVER? You can do this maneuver at home whenever you have symptoms of vertigo. You may do the Epley maneuver  up to 3 times a day until your symptoms of vertigo go away. HOW SHOULD I DO THE EPLEY MANEUVER? 1. Sit on the edge of a bed or table with your back straight. Your legs should be extended or hanging over the edge of the bed or table.  2. Turn your head halfway toward the affected ear.  3. Lie backward quickly with your head turned until you are lying flat on your back. You may want to position a pillow under your shoulders.  4. Hold this position for 30 seconds. You may experience an attack of vertigo. This is normal. Hold this position until the vertigo stops. 5. Then turn your head to the opposite direction until your unaffected ear is facing the floor.  6. Hold this position for 30 seconds. You may experience an attack of vertigo. This is normal. Hold this position until the vertigo stops. 7. Now turn your whole body to the same side as your head. Hold for another 30 seconds.  8. You can then sit back up. ARE THERE RISKS TO THIS MANEUVER? In some cases, you may have other symptoms (such as changes in your vision, weakness, or numbness). If you have these symptoms, stop doing the maneuver and call your health care provider. Even if doing these maneuvers relieves your vertigo, you may still have dizziness. Dizziness is the sensation of light-headedness but without the sensation of movement. Even though the Epley maneuver may relieve your vertigo, it is possible that your symptoms will return within 5 years. WHAT SHOULD I DO AFTER THIS MANEUVER? After doing the Epley maneuver, you can return to your normal activities. Ask your doctor if there is anything you should do at home to prevent vertigo. This may include:  Sleeping with two or more pillows to keep your head elevated.  Not sleeping on the side of your affected ear.  Getting up slowly from bed.  Avoiding sudden movements during the day.  Avoiding extreme head movement, like looking up or bending over.  Wearing a cervical collar to  prevent sudden head movements. WHAT SHOULD I DO IF MY SYMPTOMS GET WORSE? Call your health care provider if your vertigo gets worse. Call your provider right way if you have other symptoms, including:   Nausea.  Vomiting.  Headache.  Weakness.  Numbness.  Vision changes. Document Released: 04/11/2013 Document Reviewed: 04/11/2013 [facility] Patient Information 2015 [place], [place]. This information is not intended to replace advice given to you by your health care provider. Make sure you discuss any questions you have with your health care provider.

## 2013-12-13 ENCOUNTER — Encounter: Payer: Self-pay | Admitting: *Deleted

## 2013-12-13 ENCOUNTER — Encounter: Payer: Self-pay | Admitting: Family Medicine

## 2013-12-13 ENCOUNTER — Ambulatory Visit (INDEPENDENT_AMBULATORY_CARE_PROVIDER_SITE_OTHER): Payer: BC Managed Care – PPO | Admitting: Family Medicine

## 2013-12-13 VITALS — BP 118/72 | HR 84 | Temp 97.6°F | Wt 215.5 lb

## 2013-12-13 DIAGNOSIS — H8110 Benign paroxysmal vertigo, unspecified ear: Secondary | ICD-10-CM | POA: Insufficient documentation

## 2013-12-13 DIAGNOSIS — H8112 Benign paroxysmal vertigo, left ear: Secondary | ICD-10-CM

## 2013-12-13 NOTE — Patient Instructions (Signed)
I do think you have benign positional vertigo. Continue valium - but take 1 tablet nightly for next several nights to help with sleep Do exercises provided today 1-2 times a day as tolerated Let me know if not improving as expected.  Benign Positional Vertigo Vertigo means you feel like you or your surroundings are moving when they are not. Benign positional vertigo is the most common form of vertigo. Benign means that the cause of your condition is not serious. Benign positional vertigo is more common in older adults. CAUSES  Benign positional vertigo is the result of an upset in the labyrinth system. This is an area in the middle ear that helps control your balance. This may be caused by a viral infection, head injury, or repetitive motion. However, often no specific cause is found. SYMPTOMS  Symptoms of benign positional vertigo occur when you move your head or eyes in different directions. Some of the symptoms may include:  Loss of balance and falls.  Vomiting.  Blurred vision.  Dizziness.  Nausea.  Involuntary eye movements (nystagmus). DIAGNOSIS  Benign positional vertigo is usually diagnosed by physical exam. If the specific cause of your benign positional vertigo is unknown, your caregiver may perform imaging tests, such as magnetic resonance imaging (MRI) or computed tomography (CT). TREATMENT  Your caregiver may recommend movements or procedures to correct the benign positional vertigo. Medicines such as meclizine, benzodiazepines, and medicines for nausea may be used to treat your symptoms. In rare cases, if your symptoms are caused by certain conditions that affect the inner ear, you may need surgery. HOME CARE INSTRUCTIONS   Follow your caregiver's instructions.  Move slowly. Do not make sudden body or head movements.  Avoid driving.  Avoid operating heavy machinery.  Avoid performing any tasks that would be dangerous to you or others during a vertigo  episode.  Drink enough fluids to keep your urine clear or pale yellow. SEEK IMMEDIATE MEDICAL CARE IF:   You develop problems with walking, weakness, numbness, or using your arms, hands, or legs.  You have difficulty speaking.  You develop severe headaches.  Your nausea or vomiting continues or gets worse.  You develop visual changes.  Your family or friends notice any behavioral changes.  Your condition gets worse.  You have a fever.  You develop a stiff neck or sensitivity to light. MAKE SURE YOU:   Understand these instructions.  Will watch your condition.  Will get help right away if you are not doing well or get worse. Document Released: 01/12/2006 Document Revised: 06/29/2011 Document Reviewed: 12/25/2010 [facility] Patient Information 2015 [place], [place]. This information is not intended to replace advice given to you by your health care provider. Make sure you discuss any questions you have with your health care provider.

## 2013-12-13 NOTE — Progress Notes (Signed)
BP 118/72  Pulse 84  Temp(Src) 97.6 F (36.4 C) (Oral)  Wt 215 lb 8 oz (97.75 kg)   CC: ER f/u  Subjective:    Patient ID: Alan Morrison, male    DOB: 1950/12/23, 63 y.o.   MRN: [id number]  HPI: Alan Morrison is a 63 y.o. male presenting on 12/13/2013 for Follow-up   [name] was evaluated at ER 12/10/2013 with vertigo and blurry vision. Initially concerned for central vertigo as he had several risk factors for stroke but fortunately his MRI/MRA were normal. Treated with IV valium with good resolution of symptoms, sent home with epley maneuvers.  Sxs started Saturday morning 12/09/2013. Pt describes worse vertigo "room spinning" when he first gets out of bed - this lasts 30 min - 1 hour. Blurry vision described as less focused during vertigo not otherwise. Does well the rest of the day. Denies preceding viral URI or colds. No hearing changes. Endorses predominant symptoms when laying on left side. Vertigo precipitated with sudden head movements. Sent home with valium and zofran prn. No more bad nausea since Sunday morning.  MRI HEAD WITHOUT AND WITH CONTRAST  MRA HEAD WITHOUT CONTRAST  MRA NECK WITHOUT AND WITH CONTRAST  TECHNIQUE:  Multiplanar, multiecho pulse sequences of the brain and surrounding  structures were obtained without and with intravenous contrast.  Angiographic images of the Circle of Willis were obtained using MRA  technique without intravenous contrast. Angiographic images of the  neck were obtained using MRA technique without and with intravenous  contrast. Carotid stenosis measurements (when applicable) are  obtained utilizing NASCET criteria, using the distal internal  carotid diameter as the denominator.  CONTRAST: 20 mL MultiHance IV  COMPARISON: None.  FINDINGS:  MRI HEAD FINDINGS  Negative for acute infarction. Small white matter hyperintensities  in the right frontal white matter most likely due to chronic  microvascular ischemia. No cortical infarct.  Brainstem and basal  ganglia normal.  Pituitary normal in size. Negative for hemorrhage or mass lesion.  Postcontrast imaging reveals normal enhancement  Minimal mucosal edema in the paranasal sinuses.  MRA HEAD FINDINGS  Left vertebral artery dominant and patent to the basilar. Left PICA  patent. Right vertebral artery ends in PICA. Basilar widely patent.  Patent posterior communicating artery bilaterally. Superior  cerebellar and posterior cerebral arteries are patent bilaterally.  Internal carotid artery widely patent bilaterally. Anterior and  middle cerebral arteries are patent bilaterally.  Negative for cerebral aneurysm.  MRA NECK FINDINGS  Standard 3 vessel arch. Proximal great vessels widely patent  Carotid artery widely patent bilaterally without significant  atherosclerotic disease or dissection  Left vertebral artery is dominant and widely patent. Smaller right  vertebral artery ends in PICA, a normal variant.  IMPRESSION:  Negative for acute infarct. Minimal chronic microvascular ischemic  change.  Negative MRA head  Negative MRA neck.  Electronically Signed  By: [name] M.D.  On: 12/10/2013 14:35   Relevant past medical, surgical, family and social history reviewed and updated as indicated.  Allergies and medications reviewed and updated. Current Outpatient Prescriptions on File Prior to Visit  Medication Sig  . aspirin 81 MG tablet Take 81 mg by mouth daily.    [name] atorvastatin (LIPITOR) 10 MG tablet Take 0.5 tablets (5 mg total) by mouth daily.  . diazepam (VALIUM) 5 MG tablet Take 1 tablet (5 mg total) by mouth every 6 (six) hours as needed (vertigo).  [name] glimepiride (AMARYL) 2 MG tablet Take 1 tablet (2 mg total)  by mouth daily.  [name] glucose blood (ONE TOUCH ULTRA TEST) test strip 1 each by Other route See admin instructions. Check blood sugar 3 times weekly.  . metFORMIN (GLUCOPHAGE) 1000 MG tablet Take 1 tablet (1,000 mg total) by mouth 2 (two) times daily.    . Multiple Vitamin (MULTIVITAMIN) tablet Take 1 tablet by mouth daily.    . ondansetron (ZOFRAN ODT) 4 MG disintegrating tablet Take 1 tablet (4 mg total) by mouth every 8 (eight) hours as needed for nausea or vomiting.  . pioglitazone (ACTOS) 30 MG tablet Take 1 tablet (30 mg total) by mouth daily.  . probenecid (BENEMID) 500 MG tablet Take 1 tablet (500 mg total) by mouth 2 (two) times daily.  . ramipril (ALTACE) 5 MG capsule Take 1 capsule (5 mg total) by mouth daily.  . sitaGLIPtin (JANUVIA) 100 MG tablet Take 1 tablet (100 mg total) by mouth daily.  . tamsulosin (FLOMAX) 0.4 MG CAPS capsule Take 1 capsule (0.4 mg total) by mouth daily.   No current facility-administered medications on file prior to visit.    Review of Systems Per HPI unless specifically indicated above    Objective:    BP 118/72  Pulse 84  Temp(Src) 97.6 F (36.4 C) (Oral)  Wt 215 lb 8 oz (97.75 kg)  Physical Exam  Nursing note and vitals reviewed. Constitutional: He is oriented to person, place, and time. He appears well-developed and well-nourished. No distress.  HENT:  Mouth/Throat: Oropharynx is clear and moist. No oropharyngeal exudate.  Eyes: Conjunctivae and EOM are normal. Pupils are equal, round, and reactive to light. No scleral icterus.  Neck: Normal range of motion. Neck supple. Carotid bruit is not present. No thyromegaly present.  Cardiovascular: Normal rate, regular rhythm, normal heart sounds and intact distal pulses.   No murmur heard. Pulmonary/Chest: Effort normal and breath sounds normal. No respiratory distress. He has no wheezes. He has no rales.  Musculoskeletal: He exhibits no edema.  Lymphadenopathy:    He has no cervical adenopathy.  Neurological: He is alert and oriented to person, place, and time. He has normal strength. No cranial nerve deficit or sensory deficit.  CN 2-12 intact FTN intact       Assessment & Plan:   Problem List Items Addressed This Visit   BPV (benign  positional vertigo) - Primary     With recent normal MRI/MRA brain and neck and positive dix hallpike on left. epley maneuver performed today x1. Continue valium and zofran prn.  rec take valium 1 pill nightly to help with sleep. Update if not improving as expected. Will send home with modified epley maneuvers        Follow up plan: Return if symptoms worsen or fail to improve.

## 2013-12-13 NOTE — Assessment & Plan Note (Signed)
With recent normal MRI/MRA brain and neck and positive dix hallpike on left. epley maneuver performed today x1. Continue valium and zofran prn.  rec take valium 1 pill nightly to help with sleep. Update if not improving as expected. Will send home with modified epley maneuvers

## 2013-12-13 NOTE — Progress Notes (Signed)
Pre visit review using our clinic review tool, if applicable. No additional management support is needed unless otherwise documented below in the visit note. 

## 2013-12-19 DIAGNOSIS — Z8614 Personal history of Methicillin resistant Staphylococcus aureus infection: Secondary | ICD-10-CM

## 2013-12-19 HISTORY — DX: Personal history of Methicillin resistant Staphylococcus aureus infection: Z86.14

## 2014-01-02 LAB — HM DIABETES EYE EXAM

## 2014-01-14 ENCOUNTER — Encounter: Payer: Self-pay | Admitting: Family Medicine

## 2014-03-02 ENCOUNTER — Encounter: Payer: Self-pay | Admitting: Cardiology

## 2014-05-20 ENCOUNTER — Other Ambulatory Visit: Payer: Self-pay | Admitting: Family Medicine

## 2014-05-20 DIAGNOSIS — I10 Essential (primary) hypertension: Secondary | ICD-10-CM

## 2014-05-20 DIAGNOSIS — E785 Hyperlipidemia, unspecified: Secondary | ICD-10-CM

## 2014-05-20 DIAGNOSIS — E1165 Type 2 diabetes mellitus with hyperglycemia: Secondary | ICD-10-CM

## 2014-05-20 DIAGNOSIS — E669 Obesity, unspecified: Secondary | ICD-10-CM

## 2014-05-20 DIAGNOSIS — IMO0002 Reserved for concepts with insufficient information to code with codable children: Secondary | ICD-10-CM

## 2014-05-20 DIAGNOSIS — E559 Vitamin D deficiency, unspecified: Secondary | ICD-10-CM

## 2014-05-20 DIAGNOSIS — Z125 Encounter for screening for malignant neoplasm of prostate: Secondary | ICD-10-CM

## 2014-05-23 ENCOUNTER — Other Ambulatory Visit (INDEPENDENT_AMBULATORY_CARE_PROVIDER_SITE_OTHER): Payer: 59

## 2014-05-23 DIAGNOSIS — IMO0002 Reserved for concepts with insufficient information to code with codable children: Secondary | ICD-10-CM

## 2014-05-23 DIAGNOSIS — E669 Obesity, unspecified: Secondary | ICD-10-CM

## 2014-05-23 DIAGNOSIS — Z125 Encounter for screening for malignant neoplasm of prostate: Secondary | ICD-10-CM

## 2014-05-23 DIAGNOSIS — E785 Hyperlipidemia, unspecified: Secondary | ICD-10-CM

## 2014-05-23 DIAGNOSIS — E1165 Type 2 diabetes mellitus with hyperglycemia: Secondary | ICD-10-CM

## 2014-05-23 DIAGNOSIS — E559 Vitamin D deficiency, unspecified: Secondary | ICD-10-CM

## 2014-05-23 LAB — HEMOGLOBIN A1C: HEMOGLOBIN A1C: 7.5 % — AB (ref 4.6–6.5)

## 2014-05-23 LAB — BASIC METABOLIC PANEL
BUN: 25 mg/dL — ABNORMAL HIGH (ref 6–23)
CO2: 28 mEq/L (ref 19–32)
Calcium: 10 mg/dL (ref 8.4–10.5)
Chloride: 106 mEq/L (ref 96–112)
Creatinine, Ser: 1.07 mg/dL (ref 0.40–1.50)
GFR: 74.2 mL/min (ref >60.00)
Glucose, Bld: 135 mg/dL — ABNORMAL HIGH (ref 70–99)
POTASSIUM: 5.3 meq/L — AB (ref 3.5–5.1)
Sodium: 142 mEq/L (ref 135–145)

## 2014-05-23 LAB — VITAMIN D 25 HYDROXY (VIT D DEFICIENCY, FRACTURES): VITD: 31.16 ng/mL (ref 30.00–100.00)

## 2014-05-23 LAB — MICROALBUMIN / CREATININE URINE RATIO
Creatinine,U: 109 mg/dL
MICROALB UR: 0.4 mg/dL (ref 0.0–1.9)
Microalb Creat Ratio: 0.4 mg/g (ref 0.0–30.0)

## 2014-05-23 LAB — LIPID PANEL
Cholesterol: 139 mg/dL (ref 0–200)
HDL: 40.9 mg/dL (ref >39.00)
LDL CALC: 82 mg/dL (ref 0–99)
NonHDL: 98.1
Total CHOL/HDL Ratio: 3
Triglycerides: 83 mg/dL (ref 0.0–149.0)
VLDL: 16.6 mg/dL (ref 0.0–40.0)

## 2014-05-23 LAB — PSA: PSA: 1.39 ng/mL (ref 0.10–4.00)

## 2014-05-29 ENCOUNTER — Encounter: Payer: Self-pay | Admitting: Family Medicine

## 2014-05-29 ENCOUNTER — Ambulatory Visit (INDEPENDENT_AMBULATORY_CARE_PROVIDER_SITE_OTHER): Payer: 59 | Admitting: Family Medicine

## 2014-05-29 VITALS — BP 130/80 | HR 84 | Temp 98.0°F | Ht 68.0 in | Wt 219.5 lb

## 2014-05-29 DIAGNOSIS — Z0000 Encounter for general adult medical examination without abnormal findings: Secondary | ICD-10-CM

## 2014-05-29 DIAGNOSIS — E1165 Type 2 diabetes mellitus with hyperglycemia: Secondary | ICD-10-CM

## 2014-05-29 DIAGNOSIS — N138 Other obstructive and reflux uropathy: Secondary | ICD-10-CM

## 2014-05-29 DIAGNOSIS — E875 Hyperkalemia: Secondary | ICD-10-CM | POA: Diagnosis not present

## 2014-05-29 DIAGNOSIS — Z23 Encounter for immunization: Secondary | ICD-10-CM

## 2014-05-29 DIAGNOSIS — E785 Hyperlipidemia, unspecified: Secondary | ICD-10-CM

## 2014-05-29 DIAGNOSIS — Z87891 Personal history of nicotine dependence: Secondary | ICD-10-CM

## 2014-05-29 DIAGNOSIS — N401 Enlarged prostate with lower urinary tract symptoms: Secondary | ICD-10-CM

## 2014-05-29 DIAGNOSIS — R5382 Chronic fatigue, unspecified: Secondary | ICD-10-CM | POA: Diagnosis not present

## 2014-05-29 DIAGNOSIS — E669 Obesity, unspecified: Secondary | ICD-10-CM

## 2014-05-29 DIAGNOSIS — E611 Iron deficiency: Secondary | ICD-10-CM | POA: Diagnosis not present

## 2014-05-29 DIAGNOSIS — I10 Essential (primary) hypertension: Secondary | ICD-10-CM

## 2014-05-29 DIAGNOSIS — IMO0002 Reserved for concepts with insufficient information to code with codable children: Secondary | ICD-10-CM

## 2014-05-29 LAB — POCT URINALYSIS DIPSTICK
Bilirubin, UA: NEGATIVE
Glucose, UA: NEGATIVE
Ketones, UA: NEGATIVE
NITRITE UA: NEGATIVE
PROTEIN UA: NEGATIVE
RBC UA: NEGATIVE
Spec Grav, UA: >=1.03
UROBILINOGEN UA: 0.2
pH, UA: 5.5

## 2014-05-29 MED ORDER — FINASTERIDE 5 MG PO TABS: 5.0000 mg | ORAL_TABLET | Freq: Every day | ORAL | Status: DC

## 2014-05-29 NOTE — Assessment & Plan Note (Signed)
Chronic, stable on lipitor.  

## 2014-05-29 NOTE — Assessment & Plan Note (Signed)
Body mass index is 33.38 kg/(m^2).  Reviewed healthy diet and lifestyle changes to affect sustainable weight loss.

## 2014-05-29 NOTE — Addendum Note (Signed)
Addended by: [name] on: 05/29/2014 05:33 PM   Modules accepted: Miquel Dunn

## 2014-05-29 NOTE — Assessment & Plan Note (Signed)
Chronic, above goal. On 4 oral med regimen. Discussed Na/glu cotransporter therapy, discussed injectable therapy, discussed renewed effort at increased activity and tighter adherence to diabetic diet. Will reassess in 4 months and if persistently above goal change med regimen likely to include injectable therapy.

## 2014-05-29 NOTE — Progress Notes (Signed)
BP 130/80 mmHg  Pulse 84  Temp(Src) 98 F (36.7 C) (Oral)  Ht 5\' 8"  (1.727 m)  Wt 219 lb 8 oz (99.565 kg)  BMI 33.38 kg/m2   CC: CPE  Subjective:    Patient ID: Alan Morrison, male    DOB: [birth year], 64 y.o.   MRN: [id number]  HPI: Alan Morrison is a 64 y.o. male presenting on 05/29/2014 for Annual Exam   Recent MRSA infection R knee.  Recent hyperkalemia - advised hold ramipril for last week and will recheck today.  Persistent vertigo with neck extension. Had normal neck MRI/MRA at ER.  Preventative: COLONOSCOPY Date: 07/2013 1 benign polyp rpt 10 yrs [name]) Prostate - last year normal. Has seen [facility] for low testosterone, told things looking ok. Would like to continue checking yearly. Noticing some urgency and leaking with increased frequency and weakening of stream. Rare nocturia. No dysuria. Flu shot at work.  Pneumovax 2013 Tetanus 2006. Tdap today. Shingles - 08/2013 Seat belt use and sunscreen use discussed. No changing moles.  Lives with wife  Occupation: equipment maintenance  Activity: some walking.  Diet: some water, daily fruits/vegetables, diet drinks   Relevant past medical, surgical, family and social history reviewed and updated as indicated. Interim medical history since our last visit reviewed. Allergies and medications reviewed and updated. Current Outpatient Prescriptions on File Prior to Visit  Medication Sig  . aspirin 81 MG tablet Take 81 mg by mouth daily.    [name] atorvastatin (LIPITOR) 10 MG tablet Take 0.5 tablets (5 mg total) by mouth daily.  [name] glimepiride (AMARYL) 2 MG tablet Take 1 tablet (2 mg total) by mouth daily.  [name] glucose blood (ONE TOUCH ULTRA TEST) test strip 1 each by Other route See admin instructions. Check blood sugar 3 times weekly.  . metFORMIN (GLUCOPHAGE) 1000 MG tablet Take 1 tablet (1,000 mg total) by mouth 2 (two) times daily.  . Multiple Vitamin (MULTIVITAMIN) tablet Take 1 tablet by mouth daily.    .  pioglitazone (ACTOS) 30 MG tablet Take 1 tablet (30 mg total) by mouth daily.  . probenecid (BENEMID) 500 MG tablet Take 1 tablet (500 mg total) by mouth 2 (two) times daily.  . sitaGLIPtin (JANUVIA) 100 MG tablet Take 1 tablet (100 mg total) by mouth daily.  . tamsulosin (FLOMAX) 0.4 MG CAPS capsule Take 1 capsule (0.4 mg total) by mouth daily.   No current facility-administered medications on file prior to visit.    Review of Systems  Constitutional: Negative for fever, chills, activity change, appetite change, fatigue and unexpected weight change.  HENT: Negative for hearing loss.   Eyes: Negative for visual disturbance.  Respiratory: Negative for cough, chest tightness, shortness of breath and wheezing.   Cardiovascular: Negative for chest pain, palpitations and leg swelling.  Gastrointestinal: Positive for abdominal pain (intermittent ache). Negative for nausea, vomiting, diarrhea, constipation, blood in stool and abdominal distention.  Genitourinary: Negative for hematuria and difficulty urinating.  Musculoskeletal: Negative for myalgias, arthralgias and neck pain.  Skin: Negative for rash.  Neurological: Positive for dizziness (with neck extension). Negative for seizures, syncope and headaches.  Hematological: Negative for adenopathy. Does not bruise/bleed easily.  Psychiatric/Behavioral: Negative for dysphoric mood. The patient is not nervous/anxious.    Per HPI unless specifically indicated above     Objective:    BP 130/80 mmHg  Pulse 84  Temp(Src) 98 F (36.7 C) (Oral)  Ht 5\' 8"  (1.727 m)  Wt 219 lb 8 oz (99.565 kg)  BMI 33.38 kg/m2  Wt Readings from Last 3 Encounters:  05/29/14 219 lb 8 oz (99.565 kg)  12/13/13 215 lb 8 oz (97.75 kg)  08/23/13 215 lb (97.523 kg)    Physical Exam  Constitutional: He is oriented to person, place, and time. He appears well-developed and well-nourished. No distress.  HENT:  Head: Normocephalic and atraumatic.  Right Ear: Hearing,  tympanic membrane, external ear and ear canal normal.  Left Ear: Hearing, tympanic membrane, external ear and ear canal normal.  Nose: Nose normal.  Mouth/Throat: Uvula is midline, oropharynx is clear and moist and mucous membranes are normal. No oropharyngeal exudate, posterior oropharyngeal edema or posterior oropharyngeal erythema.  Eyes: Conjunctivae and EOM are normal. Pupils are equal, round, and reactive to light. No scleral icterus.  Neck: Normal range of motion. Neck supple. Carotid bruit is not present. No thyromegaly present.  Cardiovascular: Normal rate, regular rhythm, normal heart sounds and intact distal pulses.   No murmur heard. Pulses:      Radial pulses are 2+ on the right side, and 2+ on the left side.  Pulmonary/Chest: Effort normal and breath sounds normal. No respiratory distress. He has no wheezes. He has no rales.  Abdominal: Soft. Bowel sounds are normal. He exhibits no distension and no mass. There is no tenderness. There is no rebound and no guarding.  Genitourinary: Rectum normal and prostate normal. Rectal exam shows no external hemorrhoid, no internal hemorrhoid, no fissure, no mass, no tenderness and anal tone normal. Prostate is not enlarged (15gm) and not tender.  Musculoskeletal: Normal range of motion. He exhibits no edema.  Lymphadenopathy:    He has no cervical adenopathy.  Neurological: He is alert and oriented to person, place, and time.  CN grossly intact, station and gait intact  Skin: Skin is warm and dry. No rash noted.  Psychiatric: He has a normal mood and affect. His behavior is normal. Judgment and thought content normal.  Nursing note and vitals reviewed.  Results for orders placed or performed in visit on 05/29/14  POCT Urinalysis Dipstick  Result Value Ref Range   Color, UA Yellow    Clarity, UA Clear    Glucose, UA Negative    Bilirubin, UA Negative    Ketones, UA Negative    Spec Grav, UA >=1.030    Blood, UA Negative    pH, UA 5.5     Protein, UA Negative    Urobilinogen, UA 0.2    Nitrite, UA Negative    Leukocytes, UA small (1+)       Assessment & Plan:   Problem List Items Addressed This Visit    Obesity    Body mass index is 33.38 kg/(m^2).  Reviewed healthy diet and lifestyle changes to affect sustainable weight loss.      Iron deficiency    Recheck levels today.      Relevant Orders   Ferritin   IBC panel   CBC with Differential/Platelet   HLD (hyperlipidemia)    Chronic, stable on lipitor.      Relevant Medications   ramipril (ALTACE) 5 MG capsule   Healthcare maintenance - Primary    Preventative protocols reviewed and updated unless pt declined. Discussed healthy diet and lifestyle.       Essential hypertension, benign    Chronic, stable off ramipril. Hold ramipril (2/2 hyperkalemia) and recheck potassium today.      Relevant Medications   ramipril (ALTACE) 5 MG capsule   Diabetes type 2, uncontrolled  Chronic, above goal. On 4 oral med regimen. Discussed Na/glu cotransporter therapy, discussed injectable therapy, discussed renewed effort at increased activity and tighter adherence to diabetic diet. Will reassess in 4 months and if persistently above goal change med regimen likely to include injectable therapy.       Relevant Medications   ramipril (ALTACE) 5 MG capsule   BPH (benign prostatic hypertrophy) with urinary obstruction    Increasing bph sxs - frequency, urgency, weakening of stream. Continue flomax, add finasteride.  Check UA today to r/o infection - LE on UA, 3-5 wbc/ 3-5 rbc on micro. Will send culture and if positive, treat accordingly.      Relevant Medications   PROSCAR 5 MG PO TABS   Other Relevant Orders   POCT Urinalysis Dipstick (Completed)   Urine culture    Other Visit Diagnoses    Hyperkalemia        Relevant Orders    Potassium    Chronic fatigue        Relevant Orders    Vitamin B12    Need for Tdap vaccination        Relevant Orders    Tdap  vaccine greater than or equal to 7yo IM (Completed)        Follow up plan: Return in about 4 months (around 09/27/2014), or as needed, for follow up visit.

## 2014-05-29 NOTE — Assessment & Plan Note (Signed)
Preventative protocols reviewed and updated unless pt declined. Discussed healthy diet and lifestyle.  

## 2014-05-29 NOTE — Progress Notes (Signed)
Pre visit review using our clinic review tool, if applicable. No additional management support is needed unless otherwise documented below in the visit note. 

## 2014-05-29 NOTE — Assessment & Plan Note (Addendum)
Increasing bph sxs - frequency, urgency, weakening of stream. Continue flomax, add finasteride.  Check UA today to r/o infection - LE on UA, 3-5 wbc/ 3-5 rbc on micro. Will send culture and if positive, treat accordingly.

## 2014-05-29 NOTE — Assessment & Plan Note (Signed)
Chronic, stable off ramipril. Hold ramipril (2/2 hyperkalemia) and recheck potassium today.

## 2014-05-29 NOTE — Patient Instructions (Addendum)
Tdap today.  Urinalysis today and blood work.  Return in 4 months for labs and afterwards for office visit to follow diabetes.  Increase walking and watch carbs for goal weight loss. Januvia, tradjenta and onglyza are in the same family - see if any cheaper than the other.  Start finasteride 5mg  daily for urine symptoms.

## 2014-05-29 NOTE — Assessment & Plan Note (Signed)
Recheck levels today

## 2014-05-30 LAB — CBC WITH DIFFERENTIAL/PLATELET
Basophils Absolute: 0 10*3/uL (ref 0.0–0.1)
Basophils Relative: 0.1 % (ref 0.0–3.0)
EOS ABS: 0.3 10*3/uL (ref 0.0–0.7)
EOS PCT: 3.5 % (ref 0.0–5.0)
HEMATOCRIT: 50.6 % (ref 39.0–52.0)
Hemoglobin: 17.1 g/dL — ABNORMAL HIGH (ref 13.0–17.0)
LYMPHS ABS: 1.7 10*3/uL (ref 0.7–4.0)
Lymphocytes Relative: 20.1 % (ref 12.0–46.0)
MCHC: 33.9 g/dL (ref 30.0–36.0)
MCV: 88.5 fl (ref 78.0–100.0)
MONO ABS: 0.5 10*3/uL (ref 0.1–1.0)
Monocytes Relative: 6.1 % (ref 3.0–12.0)
NEUTROS PCT: 70.2 % (ref 43.0–77.0)
Neutro Abs: 5.8 10*3/uL (ref 1.4–7.7)
Platelets: 245 10*3/uL (ref 150.0–400.0)
RBC: 5.72 Mil/uL (ref 4.22–5.81)
RDW: 14.2 % (ref 11.5–15.5)
WBC: 8.2 10*3/uL (ref 4.0–10.5)

## 2014-05-30 LAB — POTASSIUM: Potassium: 4.4 mEq/L (ref 3.5–5.1)

## 2014-05-30 LAB — URINE CULTURE
COLONY COUNT: NO GROWTH
Organism ID, Bacteria: NO GROWTH

## 2014-05-30 LAB — VITAMIN B12: Vitamin B-12: 403 pg/mL (ref 211–911)

## 2014-05-30 LAB — IBC PANEL
Iron: 59 ug/dL (ref 42–165)
Saturation Ratios: 12.8 % — ABNORMAL LOW (ref 20.0–50.0)
Transferrin: 328 mg/dL (ref 212.0–360.0)

## 2014-05-30 LAB — FERRITIN: Ferritin: 54.2 ng/mL (ref 22.0–322.0)

## 2014-05-31 ENCOUNTER — Encounter: Payer: Self-pay | Admitting: *Deleted

## 2014-06-06 ENCOUNTER — Other Ambulatory Visit: Payer: Self-pay

## 2014-06-06 MED ORDER — PROBENECID 500 MG PO TABS: 500.0000 mg | ORAL_TABLET | Freq: Two times a day (BID) | ORAL | Status: DC

## 2014-06-06 MED ORDER — METFORMIN HCL 1000 MG PO TABS: 1000.0000 mg | ORAL_TABLET | Freq: Two times a day (BID) | ORAL | Status: DC

## 2014-06-06 MED ORDER — GLIMEPIRIDE 2 MG PO TABS: 2.0000 mg | ORAL_TABLET | Freq: Every day | ORAL | Status: DC

## 2014-06-06 MED ORDER — ATORVASTATIN CALCIUM 10 MG PO TABS: 5.0000 mg | ORAL_TABLET | Freq: Every day | ORAL | Status: DC

## 2014-06-06 MED ORDER — TAMSULOSIN HCL 0.4 MG PO CAPS: 0.4000 mg | ORAL_CAPSULE | Freq: Every day | ORAL | Status: DC

## 2014-06-06 MED ORDER — SITAGLIPTIN PHOSPHATE 100 MG PO TABS: 100.0000 mg | ORAL_TABLET | Freq: Every day | ORAL | Status: DC

## 2014-06-06 MED ORDER — PIOGLITAZONE HCL 30 MG PO TABS: 30.0000 mg | ORAL_TABLET | Freq: Every day | ORAL | Status: DC

## 2014-06-06 NOTE — Telephone Encounter (Signed)
Pt request atorvastatin,glimeparide,metformin,actos,benemid,januvia and tamsulosin sent to [facility]. Pt said [facility] where rx was sent on 05-26-14 was not effective as of 04/2014. Pt knows that [facility] will not send medications. Pt advised refills done as requested.

## 2014-09-19 ENCOUNTER — Other Ambulatory Visit: Payer: Self-pay | Admitting: Family Medicine

## 2014-09-19 DIAGNOSIS — E1165 Type 2 diabetes mellitus with hyperglycemia: Secondary | ICD-10-CM

## 2014-09-19 DIAGNOSIS — IMO0002 Reserved for concepts with insufficient information to code with codable children: Secondary | ICD-10-CM

## 2014-09-20 ENCOUNTER — Other Ambulatory Visit (INDEPENDENT_AMBULATORY_CARE_PROVIDER_SITE_OTHER): Payer: 59

## 2014-09-20 DIAGNOSIS — IMO0002 Reserved for concepts with insufficient information to code with codable children: Secondary | ICD-10-CM

## 2014-09-20 DIAGNOSIS — E1165 Type 2 diabetes mellitus with hyperglycemia: Secondary | ICD-10-CM

## 2014-09-21 LAB — BASIC METABOLIC PANEL
BUN: 28 mg/dL — ABNORMAL HIGH (ref 6–23)
CO2: 24 mEq/L (ref 19–32)
Calcium: 9.7 mg/dL (ref 8.4–10.5)
Chloride: 103 mEq/L (ref 96–112)
Creatinine, Ser: 1.12 mg/dL (ref 0.40–1.50)
GFR: 70.32 mL/min (ref >60.00)
Glucose, Bld: 145 mg/dL — ABNORMAL HIGH (ref 70–99)
Potassium: 4.3 mEq/L (ref 3.5–5.1)
Sodium: 136 mEq/L (ref 135–145)

## 2014-09-21 LAB — HEMOGLOBIN A1C: Hgb A1c MFr Bld: 7.7 % — ABNORMAL HIGH (ref 4.6–6.5)

## 2014-09-27 ENCOUNTER — Encounter: Payer: Self-pay | Admitting: Family Medicine

## 2014-09-27 ENCOUNTER — Ambulatory Visit (INDEPENDENT_AMBULATORY_CARE_PROVIDER_SITE_OTHER): Payer: 59 | Admitting: Family Medicine

## 2014-09-27 VITALS — BP 130/80 | HR 76 | Temp 97.8°F | Wt 215.8 lb

## 2014-09-27 DIAGNOSIS — IMO0002 Reserved for concepts with insufficient information to code with codable children: Secondary | ICD-10-CM

## 2014-09-27 DIAGNOSIS — E1165 Type 2 diabetes mellitus with hyperglycemia: Secondary | ICD-10-CM

## 2014-09-27 DIAGNOSIS — I10 Essential (primary) hypertension: Secondary | ICD-10-CM | POA: Diagnosis not present

## 2014-09-27 MED ORDER — EXENATIDE ER 2 MG ~~LOC~~ SRER: 2.0000 mg | SUBCUTANEOUS | Status: DC

## 2014-09-27 MED ORDER — RAMIPRIL 5 MG PO CAPS: 5.0000 mg | ORAL_CAPSULE | Freq: Every day | ORAL | Status: DC

## 2014-09-27 NOTE — Progress Notes (Signed)
BP 130/80 mmHg  Pulse 76  Temp(Src) 97.8 F (36.6 C) (Oral)  Wt 215 lb 12 oz (97.864 kg)   CC: DM f/u visit  Subjective:    Patient ID: Alan Morrison, male    DOB: 1950-06-12, 64 y.o.   MRN: [id number]  HPI: Alan Morrison is a 64 y.o. male presenting on 09/27/2014 for Follow-up   DM - regularly does check sugars "up and down".  Checks when he gets home - 140-150. Doesn't check fasting. Compliant with antihyperglycemic regimen which includes: amaryl 2mg  daily, metformin 1000mg  bid, actos 30mg  daily, and januvia 100mg  daily.  Denies low sugars or hypoglycemic symptoms. Denies paresthesias. Last diabetic eye exam 12/2013.  Pneumovax: 2013.  Prevnar: not due yet. Lab Results  Component Value Date   HGBA1C 7.7* 09/20/2014   Diabetic Foot Exam - Simple   No data filed       Potassium back to normal - but pt has been only taking ramipril intermittently.   Stopped drinking diet soft drinks  Relevant past medical, surgical, family and social history reviewed and updated as indicated. Interim medical history since our last visit reviewed. Allergies and medications reviewed and updated. Current Outpatient Prescriptions on File Prior to Visit  Medication Sig  . aspirin 81 MG tablet Take 81 mg by mouth daily.    [name] atorvastatin (LIPITOR) 10 MG tablet Take 0.5 tablets (5 mg total) by mouth daily.  . finasteride (PROSCAR) 5 MG tablet Take 1 tablet (5 mg total) by mouth daily.  [name] glimepiride (AMARYL) 2 MG tablet Take 1 tablet (2 mg total) by mouth daily.  [name] glucose blood (ONE TOUCH ULTRA TEST) test strip 1 each by Other route See admin instructions. Check blood sugar 3 times weekly.  . metFORMIN (GLUCOPHAGE) 1000 MG tablet Take 1 tablet (1,000 mg total) by mouth 2 (two) times daily.  . Multiple Vitamin (MULTIVITAMIN) tablet Take 1 tablet by mouth daily.    . pioglitazone (ACTOS) 30 MG tablet Take 1 tablet (30 mg total) by mouth daily.  . probenecid (BENEMID) 500 MG tablet Take 1 tablet (500 mg  total) by mouth 2 (two) times daily.  . sitaGLIPtin (JANUVIA) 100 MG tablet Take 1 tablet (100 mg total) by mouth daily.  . tamsulosin (FLOMAX) 0.4 MG CAPS capsule Take 1 capsule (0.4 mg total) by mouth daily.  . vitamin C (ASCORBIC ACID) 500 MG tablet Take 500 mg by mouth daily.  [name] VITAMIN D, CHOLECALCIFEROL, PO Take 1 capsule by mouth daily.   No current facility-administered medications on file prior to visit.    Review of Systems Per HPI unless specifically indicated above     Objective:    BP 130/80 mmHg  Pulse 76  Temp(Src) 97.8 F (36.6 C) (Oral)  Wt 215 lb 12 oz (97.864 kg)  Wt Readings from Last 3 Encounters:  09/27/14 215 lb 12 oz (97.864 kg)  05/29/14 219 lb 8 oz (99.565 kg)  12/13/13 215 lb 8 oz (97.75 kg)    Physical Exam  Constitutional: He appears well-developed and well-nourished. No distress.  HENT:  Head: Normocephalic and atraumatic.  Right Ear: External ear normal.  Left Ear: External ear normal.  Nose: Nose normal.  Mouth/Throat: Oropharynx is clear and moist. No oropharyngeal exudate.  Eyes: Conjunctivae and EOM are normal. Pupils are equal, round, and reactive to light. No scleral icterus.  Neck: Normal range of motion. Neck supple.  Cardiovascular: Normal rate, regular rhythm, normal heart sounds and intact distal pulses.  No murmur heard. Pulmonary/Chest: Effort normal and breath sounds normal. No respiratory distress. He has no wheezes. He has no rales.  Musculoskeletal: He exhibits no edema.  See HPI for foot exam if done  Lymphadenopathy:    He has no cervical adenopathy.  Skin: Skin is warm and dry. No rash noted.  Psychiatric: He has a normal mood and affect.  Nursing note and vitals reviewed.  Results for orders placed or performed in visit on 09/20/14  Hemoglobin A1c  Result Value Ref Range   Hgb A1c MFr Bld 7.7 (H) 4.6 - 6.5 %  Basic metabolic panel  Result Value Ref Range   Sodium 136 135 - 145 mEq/L   Potassium 4.3 3.5 - 5.1  mEq/L   Chloride 103 96 - 112 mEq/L   CO2 24 19 - 32 mEq/L   Glucose, Bld 145 (H) 70 - 99 mg/dL   BUN 28 (H) 6 - 23 mg/dL   Creatinine, Ser 1.12 0.40 - 1.50 mg/dL   Calcium 9.7 8.4 - 10.5 mg/dL   GFR 70.32 >60.00 mL/min      Assessment & Plan:   Problem List Items Addressed This Visit    Diabetes type 2, uncontrolled - Primary    Persistently uncontrolled. Discussed meds - currently on 4 oral regimen and not achieving control. Discussed injectable options - insulin vs non insulin therapy.  Pt opts to try bydureon - will send in and coupon provided. Pt will price out and notify me if any affordability concerns. Discussed things to monitor for including pancreatitis, nausea, hypoglycemia. RTC 3 mo f/u. Stop januvia if bydureon is started.      Relevant Medications   ramipril (ALTACE) 5 MG capsule   Exenatide ER (BYDUREON) 2 MG SRER   Essential hypertension, benign    Restart ramipril. K back to normal. BP stable.      Relevant Medications   ramipril (ALTACE) 5 MG capsule       Follow up plan: Return in about 3 months (around 12/28/2014), or as needed, for follow up visit.

## 2014-09-27 NOTE — Patient Instructions (Addendum)
Restart ramipril.  Let's try bydureon once weekly injection. Coupon provided today. Price out at pharmacy and call me with any concerns. If you start bydureon, stop [place].  Continue working on diet, on staying active with regular exercise in routine. Return in 3 months for follow up visit.

## 2014-09-27 NOTE — Assessment & Plan Note (Signed)
Restart ramipril. K back to normal. BP stable.

## 2014-09-27 NOTE — Assessment & Plan Note (Signed)
Persistently uncontrolled. Discussed meds - currently on 4 oral regimen and not achieving control. Discussed injectable options - insulin vs non insulin therapy.  Pt opts to try bydureon - will send in and coupon provided. Pt will price out and notify me if any affordability concerns. Discussed things to monitor for including pancreatitis, nausea, hypoglycemia. RTC 3 mo f/u. Stop januvia if bydureon is started.

## 2014-09-27 NOTE — Progress Notes (Signed)
Pre visit review using our clinic review tool, if applicable. No additional management support is needed unless otherwise documented below in the visit note. 

## 2014-11-19 ENCOUNTER — Encounter: Payer: Self-pay | Admitting: Family Medicine

## 2014-11-19 ENCOUNTER — Ambulatory Visit (INDEPENDENT_AMBULATORY_CARE_PROVIDER_SITE_OTHER): Payer: 59 | Admitting: Family Medicine

## 2014-11-19 VITALS — BP 108/76 | HR 92 | Temp 98.1°F | Wt 213.2 lb

## 2014-11-19 DIAGNOSIS — IMO0002 Reserved for concepts with insufficient information to code with codable children: Secondary | ICD-10-CM

## 2014-11-19 DIAGNOSIS — E1165 Type 2 diabetes mellitus with hyperglycemia: Secondary | ICD-10-CM | POA: Diagnosis not present

## 2014-11-19 DIAGNOSIS — T887XXA Unspecified adverse effect of drug or medicament, initial encounter: Secondary | ICD-10-CM | POA: Diagnosis not present

## 2014-11-19 DIAGNOSIS — T50905A Adverse effect of unspecified drugs, medicaments and biological substances, initial encounter: Secondary | ICD-10-CM

## 2014-11-19 MED ORDER — LIRAGLUTIDE 18 MG/3ML ~~LOC~~ SOPN: 0.6000 mg | PEN_INJECTOR | Freq: Every day | SUBCUTANEOUS | Status: DC

## 2014-11-19 NOTE — Patient Instructions (Addendum)
Finish up bydureon you have at home then trial victoza (prescription provided today). We will start at low dose and if tolerated slowly increase dose. Call me with update on sugars 3-4 weeks after starting victoza (once daily injection).

## 2014-11-19 NOTE — Assessment & Plan Note (Signed)
Improved #s but intolerable side effects - see below.

## 2014-11-19 NOTE — Assessment & Plan Note (Signed)
Discussed bydureon side effects. Intolerable to patient. Interested in change to once daily victoza. Will finish bydureon supply he has at home (2 more weeks) and then fill victoza - printed script out today for patient. Coupon provided if available.

## 2014-11-19 NOTE — Progress Notes (Signed)
Pre visit review using our clinic review tool, if applicable. No additional management support is needed unless otherwise documented below in the visit note. 

## 2014-11-19 NOTE — Progress Notes (Signed)
BP 108/76 mmHg  Pulse 92  Temp(Src) 98.1 F (36.7 C) (Oral)  Wt 213 lb 4 oz (96.73 kg)   CC: discuss diabetic meds  Subjective:    Patient ID: Alan Morrison, male    DOB: 04/19/1951, 64 y.o.   MRN: [id number]  HPI: Alan Morrison is a 64 y.o. male presenting on 11/19/2014 for discuss diabetes   Currently on glimepiride 2mg  daily, metformin 1000mg  bid, and actos 30mg  daily, and [place] 100mg  daily. Bydureon 2mg  weekly was started last visit (09/27/2014) and Celesta Gentile was stopped.   This has been working great for sugar control (70-115), however he has noted significant side effects - nausea, decreased appetite first day after shot which quickly resolves. Also noticing some abdominal pain and indigestion. He is also having nodules develop at the injection site.   Relevant past medical, surgical, family and social history reviewed and updated as indicated. Interim medical history since our last visit reviewed. Allergies and medications reviewed and updated. Current Outpatient Prescriptions on File Prior to Visit  Medication Sig  . aspirin 81 MG tablet Take 81 mg by mouth daily.    [name] atorvastatin (LIPITOR) 10 MG tablet Take 0.5 tablets (5 mg total) by mouth daily.  . finasteride (PROSCAR) 5 MG tablet Take 1 tablet (5 mg total) by mouth daily.  [name] glimepiride (AMARYL) 2 MG tablet Take 1 tablet (2 mg total) by mouth daily.  [name] glucose blood (ONE TOUCH ULTRA TEST) test strip 1 each by Other route See admin instructions. Check blood sugar 3 times weekly.  . metFORMIN (GLUCOPHAGE) 1000 MG tablet Take 1 tablet (1,000 mg total) by mouth 2 (two) times daily.  . Multiple Vitamin (MULTIVITAMIN) tablet Take 1 tablet by mouth daily.    . pioglitazone (ACTOS) 30 MG tablet Take 1 tablet (30 mg total) by mouth daily.  . probenecid (BENEMID) 500 MG tablet Take 1 tablet (500 mg total) by mouth 2 (two) times daily.  . ramipril (ALTACE) 5 MG capsule Take 1 capsule (5 mg total) by mouth daily.  . tamsulosin  (FLOMAX) 0.4 MG CAPS capsule Take 1 capsule (0.4 mg total) by mouth daily.  . vitamin C (ASCORBIC ACID) 500 MG tablet Take 500 mg by mouth daily.  [name] VITAMIN D, CHOLECALCIFEROL, PO Take 1 capsule by mouth daily.   No current facility-administered medications on file prior to visit.    Review of Systems Per HPI unless specifically indicated above     Objective:    BP 108/76 mmHg  Pulse 92  Temp(Src) 98.1 F (36.7 C) (Oral)  Wt 213 lb 4 oz (96.73 kg)  Wt Readings from Last 3 Encounters:  11/19/14 213 lb 4 oz (96.73 kg)  09/27/14 215 lb 12 oz (97.864 kg)  05/29/14 219 lb 8 oz (99.565 kg)    Physical Exam  Constitutional: He appears well-developed and well-nourished. No distress.  Skin: Skin is warm and dry. No rash noted.  Skin nodules present without erythema or warmth at sites of bydureon injection  Nursing note and vitals reviewed.  Results for orders placed or performed in visit on 09/20/14  Hemoglobin A1c  Result Value Ref Range   Hgb A1c MFr Bld 7.7 (H) 4.6 - 6.5 %  Basic metabolic panel  Result Value Ref Range   Sodium 136 135 - 145 mEq/L   Potassium 4.3 3.5 - 5.1 mEq/L   Chloride 103 96 - 112 mEq/L   CO2 24 19 - 32 mEq/L   Glucose, Bld 145 (H) 70 -  99 mg/dL   BUN 28 (H) 6 - 23 mg/dL   Creatinine, Ser 1.12 0.40 - 1.50 mg/dL   Calcium 9.7 8.4 - 10.5 mg/dL   GFR 70.32 >60.00 mL/min      Assessment & Plan:   Problem List Items Addressed This Visit    Diabetes type 2, uncontrolled    Improved #s but intolerable side effects - see below.      Relevant Medications   Liraglutide 18 MG/3ML SOPN   Drug side effects - Primary    Discussed bydureon side effects. Intolerable to patient. Interested in change to once daily victoza. Will finish bydureon supply he has at home (2 more weeks) and then fill victoza - printed script out today for patient. Coupon provided if available.          Follow up plan: Return as needed.

## 2014-12-14 ENCOUNTER — Telehealth: Payer: Self-pay

## 2014-12-14 MED ORDER — "PEN NEEDLES 5/16"" 31G X 8 MM MISC": Status: DC

## 2014-12-14 NOTE — Telephone Encounter (Signed)
Pt left /vm that Liraglutide does not come with needles. Spoke with pharmacist at [address] and was advised this would be appropriate size needle 31G x 8 mm. Refill sent to [facility] and pt notified to ck with pharmacy via v/m.

## 2014-12-30 ENCOUNTER — Other Ambulatory Visit: Payer: Self-pay | Admitting: Family Medicine

## 2014-12-30 DIAGNOSIS — D751 Secondary polycythemia: Secondary | ICD-10-CM | POA: Insufficient documentation

## 2014-12-30 DIAGNOSIS — E790 Hyperuricemia without signs of inflammatory arthritis and tophaceous disease: Secondary | ICD-10-CM | POA: Insufficient documentation

## 2014-12-30 DIAGNOSIS — E1165 Type 2 diabetes mellitus with hyperglycemia: Secondary | ICD-10-CM

## 2014-12-30 DIAGNOSIS — IMO0002 Reserved for concepts with insufficient information to code with codable children: Secondary | ICD-10-CM

## 2014-12-31 ENCOUNTER — Other Ambulatory Visit (INDEPENDENT_AMBULATORY_CARE_PROVIDER_SITE_OTHER): Payer: 59

## 2014-12-31 DIAGNOSIS — E790 Hyperuricemia without signs of inflammatory arthritis and tophaceous disease: Secondary | ICD-10-CM

## 2014-12-31 DIAGNOSIS — IMO0002 Reserved for concepts with insufficient information to code with codable children: Secondary | ICD-10-CM

## 2014-12-31 DIAGNOSIS — D751 Secondary polycythemia: Secondary | ICD-10-CM | POA: Diagnosis not present

## 2014-12-31 DIAGNOSIS — E1165 Type 2 diabetes mellitus with hyperglycemia: Secondary | ICD-10-CM

## 2015-01-01 LAB — HEMOGLOBIN A1C: Hgb A1c MFr Bld: 6.2 % (ref 4.6–6.5)

## 2015-01-01 LAB — CBC WITH DIFFERENTIAL/PLATELET
BASOS PCT: 0.8 % (ref 0.0–3.0)
Basophils Absolute: 0.1 10*3/uL (ref 0.0–0.1)
EOS ABS: 0.2 10*3/uL (ref 0.0–0.7)
EOS PCT: 2.3 % (ref 0.0–5.0)
HCT: 48.1 % (ref 39.0–52.0)
HEMOGLOBIN: 16.3 g/dL (ref 13.0–17.0)
LYMPHS ABS: 1.8 10*3/uL (ref 0.7–4.0)
Lymphocytes Relative: 21.6 % (ref 12.0–46.0)
MCHC: 33.9 g/dL (ref 30.0–36.0)
MCV: 92.1 fl (ref 78.0–100.0)
MONO ABS: 0.5 10*3/uL (ref 0.1–1.0)
Monocytes Relative: 6.4 % (ref 3.0–12.0)
NEUTROS PCT: 68.9 % (ref 43.0–77.0)
Neutro Abs: 5.6 10*3/uL (ref 1.4–7.7)
PLATELETS: 238 10*3/uL (ref 150.0–400.0)
RBC: 5.22 Mil/uL (ref 4.22–5.81)
RDW: 14.1 % (ref 11.5–15.5)
WBC: 8.1 10*3/uL (ref 4.0–10.5)

## 2015-01-01 LAB — URIC ACID: Uric Acid, Serum: 5.3 mg/dL (ref 4.0–7.8)

## 2015-01-03 ENCOUNTER — Ambulatory Visit (INDEPENDENT_AMBULATORY_CARE_PROVIDER_SITE_OTHER): Payer: 59 | Admitting: Family Medicine

## 2015-01-03 ENCOUNTER — Encounter: Payer: Self-pay | Admitting: Family Medicine

## 2015-01-03 VITALS — BP 118/80 | HR 88 | Temp 97.8°F | Wt 212.5 lb

## 2015-01-03 DIAGNOSIS — E1165 Type 2 diabetes mellitus with hyperglycemia: Secondary | ICD-10-CM | POA: Diagnosis not present

## 2015-01-03 DIAGNOSIS — E66811 Obesity, class 1: Secondary | ICD-10-CM

## 2015-01-03 DIAGNOSIS — IMO0002 Reserved for concepts with insufficient information to code with codable children: Secondary | ICD-10-CM

## 2015-01-03 DIAGNOSIS — E669 Obesity, unspecified: Secondary | ICD-10-CM

## 2015-01-03 DIAGNOSIS — I10 Essential (primary) hypertension: Secondary | ICD-10-CM

## 2015-01-03 DIAGNOSIS — E790 Hyperuricemia without signs of inflammatory arthritis and tophaceous disease: Secondary | ICD-10-CM | POA: Diagnosis not present

## 2015-01-03 NOTE — Progress Notes (Signed)
BP 118/80 mmHg  Pulse 88  Temp(Src) 97.8 F (36.6 C) (Oral)  Wt 212 lb 8 oz (96.389 kg)   CC: f/u visit  Subjective:    Patient ID: Alan Morrison, male    DOB: [birth year], 64 y.o.   MRN: [id number]  HPI: Tyce Delcid is a 64 y.o. male presenting on 01/03/2015 for Follow-up   DM - regularly does check sugars fasting <105. Compliant with antihyperglycemic regimen which includes: metformin 1000mg  bid, glimepiride 2mg  daily, actos 30mg  daily, victoza daily. Did not tolerate Bydureon. Denies low sugars or hypoglycemic symptoms. Denies paresthesias. Last diabetic eye exam appt tomorrow.  Pneumovax: 2013. Prevnar: NOT DUE. Lab Results  Component Value Date   HGBA1C 6.2 12/31/2014   Diabetic Foot Exam - Simple   Simple Foot Form  Diabetic Foot exam was performed with the following findings:  Yes 01/03/2015  5:06 PM  Visual Inspection  No deformities, no ulcerations, no other skin breakdown bilaterally:  Yes  Sensation Testing  Intact to touch and monofilament testing bilaterally:  Yes  Pulse Check  Posterior Tibialis and Dorsalis pulse intact bilaterally:  Yes  Comments      Gout - no recent gout flares. On probenecid 500mg  BID.  Relevant past medical, surgical, family and social history reviewed and updated as indicated. Interim medical history since our last visit reviewed. Allergies and medications reviewed and updated. Current Outpatient Prescriptions on File Prior to Visit  Medication Sig  . aspirin 81 MG tablet Take 81 mg by mouth daily.    [name] atorvastatin (LIPITOR) 10 MG tablet Take 0.5 tablets (5 mg total) by mouth daily.  . finasteride (PROSCAR) 5 MG tablet Take 1 tablet (5 mg total) by mouth daily.  [name] glimepiride (AMARYL) 2 MG tablet Take 1 tablet (2 mg total) by mouth daily.  [name] glucose blood (ONE TOUCH ULTRA TEST) test strip 1 each by Other route See admin instructions. Check blood sugar 3 times weekly.  . Insulin Pen Needle (PEN NEEDLES 31GX5/16") 31G X 8 MM MISC Use  pen needles to inject Liraglutide 0.1 ml into skin daily.  . Liraglutide 18 MG/3ML SOPN Inject 0.1 mLs (0.6 mg total) into the skin daily.  . metFORMIN (GLUCOPHAGE) 1000 MG tablet Take 1 tablet (1,000 mg total) by mouth 2 (two) times daily.  . Multiple Vitamin (MULTIVITAMIN) tablet Take 1 tablet by mouth daily.    . pioglitazone (ACTOS) 30 MG tablet Take 1 tablet (30 mg total) by mouth daily.  . probenecid (BENEMID) 500 MG tablet Take 1 tablet (500 mg total) by mouth 2 (two) times daily.  . ramipril (ALTACE) 5 MG capsule Take 1 capsule (5 mg total) by mouth daily.  . tamsulosin (FLOMAX) 0.4 MG CAPS capsule Take 1 capsule (0.4 mg total) by mouth daily.  . vitamin C (ASCORBIC ACID) 500 MG tablet Take 500 mg by mouth daily.  [name] VITAMIN D, CHOLECALCIFEROL, PO Take 1 capsule by mouth daily.   No current facility-administered medications on file prior to visit.    Review of Systems Per HPI unless specifically indicated above     Objective:    BP 118/80 mmHg  Pulse 88  Temp(Src) 97.8 F (36.6 C) (Oral)  Wt 212 lb 8 oz (96.389 kg)  Wt Readings from Last 3 Encounters:  01/03/15 212 lb 8 oz (96.389 kg)  11/19/14 213 lb 4 oz (96.73 kg)  09/27/14 215 lb 12 oz (97.864 kg)   Body mass index is 32.32 kg/(m^2).  Physical Exam  Constitutional:  He appears well-developed and well-nourished. No distress.  HENT:  Head: Normocephalic and atraumatic.  Right Ear: External ear normal.  Left Ear: External ear normal.  Nose: Nose normal.  Mouth/Throat: Oropharynx is clear and moist. No oropharyngeal exudate.  Eyes: Conjunctivae and EOM are normal. Pupils are equal, round, and reactive to light. No scleral icterus.  Neck: Normal range of motion. Neck supple.  Cardiovascular: Normal rate, regular rhythm, normal heart sounds and intact distal pulses.   No murmur heard. Pulmonary/Chest: Effort normal and breath sounds normal. No respiratory distress. He has no wheezes. He has no rales.  Musculoskeletal:  He exhibits no edema.  See HPI for foot exam if done  Lymphadenopathy:    He has no cervical adenopathy.  Skin: Skin is warm and dry. No rash noted.  Psychiatric: He has a normal mood and affect.  Nursing note and vitals reviewed.  Results for orders placed or performed in visit on 12/31/14  Hemoglobin A1c  Result Value Ref Range   Hgb A1c MFr Bld 6.2 4.6 - 6.5 %  Uric acid  Result Value Ref Range   Uric Acid, Serum 5.3 4.0 - 7.8 mg/dL  CBC with Differential/Platelet  Result Value Ref Range   WBC 8.1 4.0 - 10.5 K/uL   RBC 5.22 4.22 - 5.81 Mil/uL   Hemoglobin 16.3 13.0 - 17.0 g/dL   HCT 48.1 39.0 - 52.0 %   MCV 92.1 78.0 - 100.0 fl   MCHC 33.9 30.0 - 36.0 g/dL   RDW 14.1 11.5 - 15.5 %   Platelets 238.0 150.0 - 400.0 K/uL   Neutrophils Relative % 68.9 43.0 - 77.0 %   Lymphocytes Relative 21.6 12.0 - 46.0 %   Monocytes Relative 6.4 3.0 - 12.0 %   Eosinophils Relative 2.3 0.0 - 5.0 %   Basophils Relative 0.8 0.0 - 3.0 %   Neutro Abs 5.6 1.4 - 7.7 K/uL   Lymphs Abs 1.8 0.7 - 4.0 K/uL   Monocytes Absolute 0.5 0.1 - 1.0 K/uL   Eosinophils Absolute 0.2 0.0 - 0.7 K/uL   Basophils Absolute 0.1 0.0 - 0.1 K/uL      Assessment & Plan:   Problem List Items Addressed This Visit    Diabetes type 2, uncontrolled - Primary    Chronic, stable. Markedly improved #s - congratulated on great sugar control to date. Continue current regimen. Denies low sugars. On actos long term. No fmhx bladder cancer, ex smoker. Consider UA next labwork      Essential hypertension, benign    Chronic, stable. Continue current regimen.      Obesity, Class I, BMI 30-34.9    Body mass index is 32.32 kg/(m^2).  Continue to monitor. Slow weight loss noted.      Hyperuricemia    Uric acid well controlled on probenecid 500mg  once daily.          Follow up plan: Return in about 5 months (around 06/05/2015), or as needed, for annual exam, prior fasting for blood work.

## 2015-01-03 NOTE — Assessment & Plan Note (Signed)
Chronic, stable. Continue current regimen. 

## 2015-01-03 NOTE — Assessment & Plan Note (Signed)
Body mass index is 32.32 kg/(m^2).  Continue to monitor. Slow weight loss noted.

## 2015-01-03 NOTE — Progress Notes (Signed)
Pre visit review using our clinic review tool, if applicable. No additional management support is needed unless otherwise documented below in the visit note. 

## 2015-01-03 NOTE — Assessment & Plan Note (Addendum)
Chronic, stable. Markedly improved #s - congratulated on great sugar control to date. Continue current regimen. Denies low sugars. On actos long term. No fmhx bladder cancer, ex smoker. Consider UA next labwork

## 2015-01-03 NOTE — Assessment & Plan Note (Signed)
Uric acid well controlled on probenecid 500mg  once daily.

## 2015-01-03 NOTE — Patient Instructions (Addendum)
Flu shot at work. Let us know to update your chart. Return in 5-6 months for physical. Congratulations on great sugar control to date! Keep it up. No changes to medicines today.

## 2015-01-04 LAB — HM DIABETES EYE EXAM

## 2015-01-16 ENCOUNTER — Encounter: Payer: Self-pay | Admitting: Family Medicine

## 2015-01-21 ENCOUNTER — Other Ambulatory Visit: Payer: Self-pay | Admitting: Family Medicine

## 2015-05-26 ENCOUNTER — Other Ambulatory Visit: Payer: Self-pay | Admitting: Family Medicine

## 2015-05-26 DIAGNOSIS — E1165 Type 2 diabetes mellitus with hyperglycemia: Principal | ICD-10-CM

## 2015-05-26 DIAGNOSIS — E559 Vitamin D deficiency, unspecified: Secondary | ICD-10-CM

## 2015-05-26 DIAGNOSIS — I10 Essential (primary) hypertension: Secondary | ICD-10-CM

## 2015-05-26 DIAGNOSIS — IMO0001 Reserved for inherently not codable concepts without codable children: Secondary | ICD-10-CM

## 2015-05-26 DIAGNOSIS — N138 Other obstructive and reflux uropathy: Secondary | ICD-10-CM

## 2015-05-26 DIAGNOSIS — N401 Enlarged prostate with lower urinary tract symptoms: Secondary | ICD-10-CM

## 2015-05-26 DIAGNOSIS — E785 Hyperlipidemia, unspecified: Secondary | ICD-10-CM

## 2015-05-26 DIAGNOSIS — Z1159 Encounter for screening for other viral diseases: Secondary | ICD-10-CM

## 2015-05-27 ENCOUNTER — Other Ambulatory Visit (INDEPENDENT_AMBULATORY_CARE_PROVIDER_SITE_OTHER): Payer: 59

## 2015-05-27 ENCOUNTER — Other Ambulatory Visit: Payer: Self-pay | Admitting: Family Medicine

## 2015-05-27 DIAGNOSIS — E785 Hyperlipidemia, unspecified: Secondary | ICD-10-CM | POA: Diagnosis not present

## 2015-05-27 DIAGNOSIS — E1165 Type 2 diabetes mellitus with hyperglycemia: Secondary | ICD-10-CM

## 2015-05-27 DIAGNOSIS — E559 Vitamin D deficiency, unspecified: Secondary | ICD-10-CM | POA: Diagnosis not present

## 2015-05-27 DIAGNOSIS — I10 Essential (primary) hypertension: Secondary | ICD-10-CM | POA: Diagnosis not present

## 2015-05-27 DIAGNOSIS — Z1159 Encounter for screening for other viral diseases: Secondary | ICD-10-CM

## 2015-05-27 DIAGNOSIS — N401 Enlarged prostate with lower urinary tract symptoms: Secondary | ICD-10-CM

## 2015-05-27 DIAGNOSIS — IMO0001 Reserved for inherently not codable concepts without codable children: Secondary | ICD-10-CM

## 2015-05-27 DIAGNOSIS — N138 Other obstructive and reflux uropathy: Secondary | ICD-10-CM

## 2015-05-27 LAB — LIPID PANEL
CHOL/HDL RATIO: 4
Cholesterol: 137 mg/dL (ref 0–200)
HDL: 39.2 mg/dL (ref >39.00)
LDL CALC: 86 mg/dL (ref 0–99)
NonHDL: 98.25
TRIGLYCERIDES: 61 mg/dL (ref 0.0–149.0)
VLDL: 12.2 mg/dL (ref 0.0–40.0)

## 2015-05-27 LAB — COMPREHENSIVE METABOLIC PANEL
ALT: 36 U/L (ref 0–53)
AST: 25 U/L (ref 0–37)
Albumin: 4.2 g/dL (ref 3.5–5.2)
Alkaline Phosphatase: 50 U/L (ref 39–117)
BUN: 24 mg/dL — ABNORMAL HIGH (ref 6–23)
CALCIUM: 10.1 mg/dL (ref 8.4–10.5)
CHLORIDE: 103 meq/L (ref 96–112)
CO2: 28 meq/L (ref 19–32)
Creatinine, Ser: 1.08 mg/dL (ref 0.40–1.50)
GFR: 73.17 mL/min (ref >60.00)
Glucose, Bld: 184 mg/dL — ABNORMAL HIGH (ref 70–99)
Potassium: 4.9 mEq/L (ref 3.5–5.1)
Sodium: 140 mEq/L (ref 135–145)
Total Bilirubin: 0.4 mg/dL (ref 0.2–1.2)
Total Protein: 6.9 g/dL (ref 6.0–8.3)

## 2015-05-27 LAB — HEMOGLOBIN A1C: Hgb A1c MFr Bld: 7.2 % — ABNORMAL HIGH (ref 4.6–6.5)

## 2015-05-27 LAB — MICROALBUMIN / CREATININE URINE RATIO
Creatinine,U: 107.4 mg/dL
MICROALB/CREAT RATIO: 0.7 mg/g (ref 0.0–30.0)

## 2015-05-27 LAB — PSA: PSA: 0.86 ng/mL (ref 0.10–4.00)

## 2015-05-27 LAB — VITAMIN D 25 HYDROXY (VIT D DEFICIENCY, FRACTURES): VITD: 40.05 ng/mL (ref 30.00–100.00)

## 2015-05-28 LAB — HEPATITIS C ANTIBODY: HCV AB: NEGATIVE

## 2015-06-06 ENCOUNTER — Encounter: Payer: Self-pay | Admitting: Family Medicine

## 2015-06-06 ENCOUNTER — Ambulatory Visit (INDEPENDENT_AMBULATORY_CARE_PROVIDER_SITE_OTHER): Payer: 59 | Admitting: Family Medicine

## 2015-06-06 VITALS — BP 120/82 | HR 96 | Temp 97.6°F | Ht 66.5 in | Wt 213.0 lb

## 2015-06-06 DIAGNOSIS — R5382 Chronic fatigue, unspecified: Secondary | ICD-10-CM

## 2015-06-06 DIAGNOSIS — Z5181 Encounter for therapeutic drug level monitoring: Secondary | ICD-10-CM | POA: Diagnosis not present

## 2015-06-06 DIAGNOSIS — Z0000 Encounter for general adult medical examination without abnormal findings: Secondary | ICD-10-CM | POA: Diagnosis not present

## 2015-06-06 DIAGNOSIS — E1165 Type 2 diabetes mellitus with hyperglycemia: Secondary | ICD-10-CM

## 2015-06-06 DIAGNOSIS — N401 Enlarged prostate with lower urinary tract symptoms: Secondary | ICD-10-CM

## 2015-06-06 DIAGNOSIS — I2510 Atherosclerotic heart disease of native coronary artery without angina pectoris: Secondary | ICD-10-CM

## 2015-06-06 DIAGNOSIS — IMO0002 Reserved for concepts with insufficient information to code with codable children: Secondary | ICD-10-CM

## 2015-06-06 DIAGNOSIS — E1140 Type 2 diabetes mellitus with diabetic neuropathy, unspecified: Secondary | ICD-10-CM

## 2015-06-06 DIAGNOSIS — I10 Essential (primary) hypertension: Secondary | ICD-10-CM

## 2015-06-06 DIAGNOSIS — E785 Hyperlipidemia, unspecified: Secondary | ICD-10-CM

## 2015-06-06 DIAGNOSIS — N138 Other obstructive and reflux uropathy: Secondary | ICD-10-CM

## 2015-06-06 DIAGNOSIS — E790 Hyperuricemia without signs of inflammatory arthritis and tophaceous disease: Secondary | ICD-10-CM

## 2015-06-06 DIAGNOSIS — R5383 Other fatigue: Secondary | ICD-10-CM | POA: Insufficient documentation

## 2015-06-06 DIAGNOSIS — E669 Obesity, unspecified: Secondary | ICD-10-CM

## 2015-06-06 LAB — POC URINALSYSI DIPSTICK (AUTOMATED)
BILIRUBIN UA: NEGATIVE
Blood, UA: NEGATIVE
Glucose, UA: NEGATIVE
KETONES UA: NEGATIVE
LEUKOCYTES UA: NEGATIVE
Nitrite, UA: NEGATIVE
Protein, UA: NEGATIVE
Spec Grav, UA: >=1.03
Urobilinogen, UA: 0.2
pH, UA: 6

## 2015-06-06 MED ORDER — PROBENECID 500 MG PO TABS: 250.0000 mg | ORAL_TABLET | Freq: Every day | ORAL | Status: DC

## 2015-06-06 MED ORDER — LIRAGLUTIDE 18 MG/3ML ~~LOC~~ SOPN: 0.6000 mg | PEN_INJECTOR | Freq: Every day | SUBCUTANEOUS | Status: DC

## 2015-06-06 MED ORDER — TAMSULOSIN HCL 0.4 MG PO CAPS: 0.4000 mg | ORAL_CAPSULE | Freq: Every day | ORAL | Status: DC

## 2015-06-06 MED ORDER — RAMIPRIL 5 MG PO CAPS: 5.0000 mg | ORAL_CAPSULE | Freq: Every day | ORAL | Status: DC

## 2015-06-06 MED ORDER — GLIMEPIRIDE 2 MG PO TABS: 2.0000 mg | ORAL_TABLET | Freq: Every day | ORAL | Status: DC

## 2015-06-06 MED ORDER — FINASTERIDE 5 MG PO TABS: ORAL_TABLET | ORAL | Status: DC

## 2015-06-06 MED ORDER — PIOGLITAZONE HCL 30 MG PO TABS: 30.0000 mg | ORAL_TABLET | Freq: Every day | ORAL | Status: DC

## 2015-06-06 MED ORDER — ATORVASTATIN CALCIUM 10 MG PO TABS: 5.0000 mg | ORAL_TABLET | Freq: Every day | ORAL | Status: DC

## 2015-06-06 MED ORDER — METFORMIN HCL 1000 MG PO TABS: 1000.0000 mg | ORAL_TABLET | Freq: Two times a day (BID) | ORAL | Status: DC

## 2015-06-06 NOTE — Assessment & Plan Note (Signed)
If persists, check TSH/CBC.

## 2015-06-06 NOTE — Addendum Note (Signed)
Addended by: Royann Shivers A on: 06/06/2015 05:18 PM   Modules accepted: Orders

## 2015-06-06 NOTE — Assessment & Plan Note (Addendum)
Asxs. Continue aspirin, statin.  

## 2015-06-06 NOTE — Progress Notes (Addendum)
BP 120/82 mmHg  Pulse 96  Temp(Src) 97.6 F (36.4 C) (Oral)  Ht 5' 6.5" (1.689 m)  Wt 213 lb (96.616 kg)  BMI 33.87 kg/m2  SpO2 98%   CC: CPE  Subjective:    Patient ID: Alan Morrison, male    DOB: [birth year], 65 y.o.   MRN: [id number]  HPI: Alan Morrison is a 65 y.o. male presenting on 06/06/2015 for Annual Exam and Medication Refill   DM - tolerating liraglutide well. Endorses neuropathy.  Diabetic Foot Exam - Simple   Simple Foot Form  Diabetic Foot exam was performed with the following findings:  Yes 06/06/2015  5:00 PM  Visual Inspection  No deformities, no ulcerations, no other skin breakdown bilaterally:  Yes  Sensation Testing  Intact to touch and monofilament testing bilaterally:  Yes  Pulse Check  Posterior Tibialis and Dorsalis pulse intact bilaterally:  Yes  Comments      Preventative: COLONOSCOPY Date: 07/2013 1 benign polyp rpt 10 yrs [name]) Prostate - last year normal. Has seen [facility] for low testosterone, told things looking ok. Would like to continue checking yearly. Noticing some urgency and leaking with increased frequency and weakening of stream. Rare nocturia. No dysuria. Flu shot yearly at work  Pneumovax 2013  Tdap 2016  Shingles - 08/2013  Seat belt use discussed  Sunscreen use discussed. No changing moles.   Lives with wife  Occupation: equipment maintenance  Activity: some walking  Diet: some water, daily fruits/vegetables, diet drinks   Relevant past medical, surgical, family and social history reviewed and updated as indicated. Interim medical history since our last visit reviewed. Allergies and medications reviewed and updated. Current Outpatient Prescriptions on File Prior to Visit  Medication Sig  . aspirin 81 MG tablet Take 81 mg by mouth daily.    [name] glucose blood (ONE TOUCH ULTRA TEST) test strip 1 each by Other route See admin instructions. Check blood sugar 3 times weekly.  . Insulin Pen Needle (PEN NEEDLES 31GX5/16")  31G X 8 MM MISC Use pen needles to inject Liraglutide 0.1 ml into skin daily.  . Multiple Vitamin (MULTIVITAMIN) tablet Take 1 tablet by mouth daily.    . vitamin C (ASCORBIC ACID) 500 MG tablet Take 500 mg by mouth daily.  [name] VITAMIN D, CHOLECALCIFEROL, PO Take 1 capsule by mouth daily.   No current facility-administered medications on file prior to visit.    Review of Systems  Constitutional: Positive for fatigue (over months). Negative for fever, chills, activity change, appetite change and unexpected weight change.  HENT: Negative for hearing loss.   Eyes: Negative for visual disturbance.  Respiratory: Negative for cough, chest tightness, shortness of breath and wheezing.   Cardiovascular: Negative for chest pain, palpitations and leg swelling.  Gastrointestinal: Negative for nausea, vomiting, abdominal pain, diarrhea, constipation, blood in stool and abdominal distention.  Genitourinary: Negative for hematuria and difficulty urinating.  Musculoskeletal: Negative for myalgias, arthralgias and neck pain.  Skin: Negative for rash.  Neurological: Negative for dizziness, seizures, syncope and headaches.  Hematological: Negative for adenopathy. Does not bruise/bleed easily.  Psychiatric/Behavioral: Negative for dysphoric mood. The patient is not nervous/anxious.    Per HPI unless specifically indicated in ROS section     Objective:    BP 120/82 mmHg  Pulse 96  Temp(Src) 97.6 F (36.4 C) (Oral)  Ht 5' 6.5" (1.689 m)  Wt 213 lb (96.616 kg)  BMI 33.87 kg/m2  SpO2 98%  Wt Readings from Last 3 Encounters:  06/06/15  213 lb (96.616 kg)  01/03/15 212 lb 8 oz (96.389 kg)  11/19/14 213 lb 4 oz (96.73 kg)    Physical Exam  Constitutional: He is oriented to person, place, and time. He appears well-developed and well-nourished. No distress.  HENT:  Head: Normocephalic and atraumatic.  Right Ear: Hearing, tympanic membrane, external ear and ear canal normal.  Left Ear: Hearing, tympanic  membrane, external ear and ear canal normal.  Nose: Nose normal.  Mouth/Throat: Uvula is midline, oropharynx is clear and moist and mucous membranes are normal. No oropharyngeal exudate, posterior oropharyngeal edema or posterior oropharyngeal erythema.  Eyes: Conjunctivae and EOM are normal. Pupils are equal, round, and reactive to light. No scleral icterus.  Neck: Normal range of motion. Neck supple. No thyromegaly present.  Cardiovascular: Normal rate, regular rhythm, normal heart sounds and intact distal pulses.   No murmur heard. Pulses:      Radial pulses are 2+ on the right side, and 2+ on the left side.  Pulmonary/Chest: Effort normal and breath sounds normal. No respiratory distress. He has no wheezes. He has no rales.  Abdominal: Soft. Bowel sounds are normal. He exhibits no distension and no mass. There is no tenderness. There is no rebound and no guarding.  Genitourinary: Rectum normal and prostate normal. Rectal exam shows no external hemorrhoid, no internal hemorrhoid, no fissure, no mass, no tenderness and anal tone normal. Prostate is not enlarged and not tender.  Musculoskeletal: Normal range of motion. He exhibits no edema.  Lymphadenopathy:    He has no cervical adenopathy.  Neurological: He is alert and oriented to person, place, and time.  CN grossly intact, station and gait intact  Skin: Skin is warm and dry. No rash noted.  Psychiatric: He has a normal mood and affect. His behavior is normal. Judgment and thought content normal.  Nursing note and vitals reviewed.  Results for orders placed or performed in visit on 06/06/15  POCT Urinalysis Dipstick (Automated)  Result Value Ref Range   Color, UA Yellow    Clarity, UA CLear    Glucose, UA Negative    Bilirubin, UA Negative    Ketones, UA Negative    Spec Grav, UA >=1.030    Blood, UA Negative    pH, UA 6.0    Protein, UA Negative    Urobilinogen, UA 0.2    Nitrite, UA Negative    Leukocytes, UA Negative  Negative      Assessment & Plan:   Problem List Items Addressed This Visit    Type 2 diabetes, uncontrolled, with neuropathy (HCC)    Chronic, stable. A1c mildly elevated despite 4 drug treatment - pt attributes to recent holiday eating. No change in plan. Recheck 6 mo. longterm actos use - check UA. Consider DSME if not done previously. Endorses some neuropathy symptoms but foot exam normal today.      Relevant Medications   atorvastatin (LIPITOR) 10 MG tablet   glimepiride (AMARYL) 2 MG tablet   metFORMIN (GLUCOPHAGE) 1000 MG tablet   pioglitazone (ACTOS) 30 MG tablet   ramipril (ALTACE) 5 MG capsule   Liraglutide 18 MG/3ML SOPN   Obesity, Class I, BMI 30-34.9    Discussed healthy lifestyle changes to affect sustainable weight loss      Relevant Medications   glimepiride (AMARYL) 2 MG tablet   metFORMIN (GLUCOPHAGE) 1000 MG tablet   pioglitazone (ACTOS) 30 MG tablet   Liraglutide 18 MG/3ML SOPN   Hyperuricemia    Decrease probenecid to 250mg   daily - if no recurrent gout flare, consider discontinue after 2 months.      HLD (hyperlipidemia)    Chronic, stable. Continue low dose lipitor.       Relevant Medications   atorvastatin (LIPITOR) 10 MG tablet   ramipril (ALTACE) 5 MG capsule   Healthcare maintenance - Primary    Preventative protocols reviewed and updated unless pt declined. Discussed healthy diet and lifestyle.       Fatigue    If persists, check TSH/CBC.      Essential hypertension, benign    Minimal continue current regimen.      Relevant Medications   atorvastatin (LIPITOR) 10 MG tablet   ramipril (ALTACE) 5 MG capsule   CORONARY ATHEROSCLEROSIS NATIVE CORONARY ARTERY    Asxs. Continue aspirin, statin.      Relevant Medications   atorvastatin (LIPITOR) 10 MG tablet   ramipril (ALTACE) 5 MG capsule   BPH (benign prostatic hypertrophy) with urinary obstruction    Stable sxs - PSA trending down with finasteride (started 2016). Pt satisfied  with med.      Relevant Medications   finasteride (PROSCAR) 5 MG tablet   tamsulosin (FLOMAX) 0.4 MG CAPS capsule    Other Visit Diagnoses    Therapeutic drug monitoring        Relevant Orders    POCT Urinalysis Dipstick (Automated) (Completed)        Follow up plan: Return in about 6 months (around 12/04/2015), or as needed, for follow up visit.

## 2015-06-06 NOTE — Addendum Note (Signed)
Addended by: [name] on: 06/06/2015 05:20 PM   Modules accepted: Miquel Dunn

## 2015-06-06 NOTE — Assessment & Plan Note (Addendum)
Chronic, stable. A1c mildly elevated despite 4 drug treatment - pt attributes to recent holiday eating. No change in plan. Recheck 6 mo. longterm actos use - check UA. Consider DSME if not done previously. Endorses some neuropathy symptoms but foot exam normal today.

## 2015-06-06 NOTE — Assessment & Plan Note (Addendum)
Chronic, stable. Continue low dose lipitor.  

## 2015-06-06 NOTE — Assessment & Plan Note (Signed)
Preventative protocols reviewed and updated unless pt declined. Discussed healthy diet and lifestyle.  

## 2015-06-06 NOTE — Assessment & Plan Note (Signed)
Decrease probenecid to 250mg  daily - if no recurrent gout flare, consider discontinue after 2 months.

## 2015-06-06 NOTE — Assessment & Plan Note (Signed)
Minimal continue current regimen.

## 2015-06-06 NOTE — Patient Instructions (Addendum)
Med refills printed today. Urinalysis today.  Return in 6 months for diabetes check Continue medicines as up to now.  Health Maintenance, Male A healthy lifestyle and preventative care can promote health and wellness.  Maintain regular health, dental, and eye exams.  Eat a healthy diet. Foods like vegetables, fruits, whole grains, low-fat dairy products, and lean protein foods contain the nutrients you need and are low in calories. Decrease your intake of foods high in solid fats, added sugars, and salt. Get information about a proper diet from your health care provider, if necessary.  Regular physical exercise is one of the most important things you can do for your health. Most adults should get at least 150 minutes of moderate-intensity exercise (any activity that increases your heart rate and causes you to sweat) each week. In addition, most adults need muscle-strengthening exercises on 2 or more days a week.   Maintain a healthy weight. The body mass index (BMI) is a screening tool to identify possible weight problems. It provides an estimate of body fat based on height and weight. Your health care provider can find your BMI and can help you achieve or maintain a healthy weight. For males 20 years and older:  A BMI below 18.5 is considered underweight.  A BMI of 18.5 to 24.9 is normal.  A BMI of 25 to 29.9 is considered overweight.  A BMI of 30 and above is considered obese.  Maintain normal blood lipids and cholesterol by exercising and minimizing your intake of saturated fat. Eat a balanced diet with plenty of fruits and vegetables. Blood tests for lipids and cholesterol should begin at age 89 and be repeated every 5 years. If your lipid or cholesterol levels are high, you are over age 38, or you are at high risk for heart disease, you may need your cholesterol levels checked more frequently.Ongoing high lipid and cholesterol levels should be treated with medicines if diet and  exercise are not working.  If you smoke, find out from your health care provider how to quit. If you do not use tobacco, do not start.  Lung cancer screening is recommended for adults aged 78-80 years who are at high risk for developing lung cancer because of a history of smoking. A yearly low-dose CT scan of the lungs is recommended for people who have at least a 30-pack-year history of smoking and are current smokers or have quit within the past 15 years. A pack year of smoking is smoking an average of 1 pack of cigarettes a day for 1 year (for example, a 30-pack-year history of smoking could mean smoking 1 pack a day for 30 years or 2 packs a day for 15 years). Yearly screening should continue until the smoker has stopped smoking for at least 15 years. Yearly screening should be stopped for people who develop a health problem that would prevent them from having lung cancer treatment.  If you choose to drink alcohol, do not have more than 2 drinks per day. One drink is considered to be 12 oz (360 mL) of beer, 5 oz (150 mL) of wine, or 1.5 oz (45 mL) of liquor.  Avoid the use of street drugs. Do not share needles with anyone. Ask for help if you need support or instructions about stopping the use of drugs.  High blood pressure causes heart disease and increases the risk of stroke. High blood pressure is more likely to develop in:  People who have blood pressure in the end  of the normal range (100-139/85-89 mm Hg).  People who are overweight or obese.  People who are African American.  If you are 49-52 years of age, have your blood pressure checked every 3-5 years. If you are 1 years of age or older, have your blood pressure checked every year. You should have your blood pressure measured twice--once when you are at a hospital or clinic, and once when you are not at a hospital or clinic. Record the average of the two measurements. To check your blood pressure when you are not at a hospital or  clinic, you can use:  An automated blood pressure machine at a pharmacy.  A home blood pressure monitor.  If you are 41-34 years old, ask your health care provider if you should take aspirin to prevent heart disease.  Diabetes screening involves taking a blood sample to check your fasting blood sugar level. This should be done once every 3 years after age 47 if you are at a normal weight and without risk factors for diabetes. Testing should be considered at a younger age or be carried out more frequently if you are overweight and have at least 1 risk factor for diabetes.  Colorectal cancer can be detected and often prevented. Most routine colorectal cancer screening begins at the age of 36 and continues through age 72. However, your health care provider may recommend screening at an earlier age if you have risk factors for colon cancer. On a yearly basis, your health care provider may provide home test kits to check for hidden blood in the stool. A small camera at the end of a tube may be used to directly examine the colon (sigmoidoscopy or colonoscopy) to detect the earliest forms of colorectal cancer. Talk to your health care provider about this at age 64 when routine screening begins. A direct exam of the colon should be repeated every 5-10 years through age 58, unless early forms of precancerous polyps or small growths are found.  People who are at an increased risk for hepatitis B should be screened for this virus. You are considered at high risk for hepatitis B if:  You were born in a country where hepatitis B occurs often. Talk with your health care provider about which countries are considered high risk.  Your parents were born in a high-risk country and you have not received a shot to protect against hepatitis B (hepatitis B vaccine).  You have HIV or AIDS.  You use needles to inject street drugs.  You live with, or have sex with, someone who has hepatitis B.  You are a man who has  sex with other men (MSM).  You get hemodialysis treatment.  You take certain medicines for conditions like cancer, organ transplantation, and autoimmune conditions.  Hepatitis C blood testing is recommended for all people born from [birth year] through 1964 and any individual with known risk factors for hepatitis C.  Healthy men should no longer receive prostate-specific antigen (PSA) blood tests as part of routine cancer screening. Talk to your health care provider about prostate cancer screening.  Testicular cancer screening is not recommended for adolescents or adult males who have no symptoms. Screening includes self-exam, a health care provider exam, and other screening tests. Consult with your health care provider about any symptoms you have or any concerns you have about testicular cancer.  Practice safe sex. Use condoms and avoid high-risk sexual practices to reduce the spread of sexually transmitted infections (STIs).  You should be  screened for STIs, including gonorrhea and chlamydia if:  You are sexually active and are younger than 24 years.  You are older than 24 years, and your health care provider tells you that you are at risk for this type of infection.  Your sexual activity has changed since you were last screened, and you are at an increased risk for chlamydia or gonorrhea. Ask your health care provider if you are at risk.  If you are at risk of being infected with HIV, it is recommended that you take a prescription medicine daily to prevent HIV infection. This is called pre-exposure prophylaxis (PrEP). You are considered at risk if:  You are a man who has sex with other men (MSM).  You are a heterosexual man who is sexually active with multiple partners.  You take drugs by injection.  You are sexually active with a partner who has HIV.  Talk with your health care provider about whether you are at high risk of being infected with HIV. If you choose to begin PrEP, you should  first be tested for HIV. You should then be tested every 3 months for as long as you are taking PrEP.  Use sunscreen. Apply sunscreen liberally and repeatedly throughout the day. You should seek shade when your shadow is shorter than you. Protect yourself by wearing long sleeves, pants, a wide-brimmed hat, and sunglasses year round whenever you are outdoors.  Tell your health care provider of new moles or changes in moles, especially if there is a change in shape or color. Also, tell your health care provider if a mole is larger than the size of a pencil eraser.  A one-time screening for abdominal aortic aneurysm (AAA) and surgical repair of large AAAs by ultrasound is recommended for men aged 21-75 years who are current or former smokers.  Stay current with your vaccines (immunizations).   This information is not intended to replace advice given to you by your health care provider. Make sure you discuss any questions you have with your health care provider.   Document Released: 10/03/2007 Document Revised: 04/27/2014 Document Reviewed: 09/01/2010 Elsevier Interactive Patient Education [employer].

## 2015-06-06 NOTE — Progress Notes (Signed)
Pre visit review using our clinic review tool, if applicable. No additional management support is needed unless otherwise documented below in the visit note. 

## 2015-06-06 NOTE — Assessment & Plan Note (Signed)
Stable sxs - PSA trending down with finasteride (started 2016). Pt satisfied with med.

## 2015-06-06 NOTE — Assessment & Plan Note (Signed)
Discussed healthy lifestyle changes to affect sustainable weight loss.  

## 2015-12-09 ENCOUNTER — Other Ambulatory Visit: Payer: 59

## 2015-12-10 ENCOUNTER — Other Ambulatory Visit (INDEPENDENT_AMBULATORY_CARE_PROVIDER_SITE_OTHER): Payer: 59

## 2015-12-10 ENCOUNTER — Telehealth: Payer: Self-pay | Admitting: Family Medicine

## 2015-12-10 DIAGNOSIS — E1140 Type 2 diabetes mellitus with diabetic neuropathy, unspecified: Secondary | ICD-10-CM

## 2015-12-10 DIAGNOSIS — E1165 Type 2 diabetes mellitus with hyperglycemia: Secondary | ICD-10-CM | POA: Diagnosis not present

## 2015-12-10 DIAGNOSIS — R5382 Chronic fatigue, unspecified: Secondary | ICD-10-CM

## 2015-12-10 DIAGNOSIS — IMO0002 Reserved for concepts with insufficient information to code with codable children: Secondary | ICD-10-CM

## 2015-12-10 NOTE — Telephone Encounter (Signed)
-----   Message from Marchia Bond sent at 12/05/2015 10:11 AM EDT ----- Regarding: Cpx labs Tues 8/22, need orders. Thanks! :-) Please order  future cpx labs for pt's upcoming lab appt. Thanks Aniceto Boss

## 2015-12-10 NOTE — Telephone Encounter (Signed)
Ordered

## 2015-12-11 LAB — CBC WITH DIFFERENTIAL/PLATELET
BASOS PCT: 0.3 % (ref 0.0–3.0)
Basophils Absolute: 0 10*3/uL (ref 0.0–0.1)
EOS ABS: 0.2 10*3/uL (ref 0.0–0.7)
Eosinophils Relative: 2.6 % (ref 0.0–5.0)
HCT: 46.2 % (ref 39.0–52.0)
HEMOGLOBIN: 16 g/dL (ref 13.0–17.0)
LYMPHS ABS: 1.6 10*3/uL (ref 0.7–4.0)
Lymphocytes Relative: 23.9 % (ref 12.0–46.0)
MCHC: 34.7 g/dL (ref 30.0–36.0)
MCV: 89.1 fl (ref 78.0–100.0)
MONO ABS: 0.5 10*3/uL (ref 0.1–1.0)
Monocytes Relative: 6.9 % (ref 3.0–12.0)
NEUTROS PCT: 66.3 % (ref 43.0–77.0)
Neutro Abs: 4.6 10*3/uL (ref 1.4–7.7)
Platelets: 222 10*3/uL (ref 150.0–400.0)
RBC: 5.19 Mil/uL (ref 4.22–5.81)
RDW: 13.2 % (ref 11.5–15.5)
WBC: 6.9 10*3/uL (ref 4.0–10.5)

## 2015-12-11 LAB — HEMOGLOBIN A1C: HEMOGLOBIN A1C: 12.5 % — AB (ref 4.6–6.5)

## 2015-12-11 LAB — TSH: TSH: 1.68 u[IU]/mL (ref 0.35–4.50)

## 2015-12-12 ENCOUNTER — Ambulatory Visit: Payer: 59 | Admitting: Family Medicine

## 2015-12-19 ENCOUNTER — Ambulatory Visit (INDEPENDENT_AMBULATORY_CARE_PROVIDER_SITE_OTHER): Payer: 59 | Admitting: Family Medicine

## 2015-12-19 ENCOUNTER — Encounter: Payer: Self-pay | Admitting: Family Medicine

## 2015-12-19 VITALS — BP 122/74 | HR 77 | Temp 98.5°F | Wt 202.5 lb

## 2015-12-19 DIAGNOSIS — IMO0002 Reserved for concepts with insufficient information to code with codable children: Secondary | ICD-10-CM

## 2015-12-19 DIAGNOSIS — E1140 Type 2 diabetes mellitus with diabetic neuropathy, unspecified: Secondary | ICD-10-CM

## 2015-12-19 DIAGNOSIS — E1165 Type 2 diabetes mellitus with hyperglycemia: Secondary | ICD-10-CM | POA: Diagnosis not present

## 2015-12-19 DIAGNOSIS — E669 Obesity, unspecified: Secondary | ICD-10-CM

## 2015-12-19 MED ORDER — INSULIN GLARGINE 100 UNIT/ML SOLOSTAR PEN: 10.0000 [IU] | PEN_INJECTOR | Freq: Every day | SUBCUTANEOUS | 3 refills | Status: DC

## 2015-12-19 MED ORDER — "PEN NEEDLES 5/16"" 31G X 8 MM MISC": 1 refills | Status: DC

## 2015-12-19 NOTE — Assessment & Plan Note (Signed)
Chronic, deteriorated.  Did not tolerate victoza - nausea. Will start lantus 10u nightly. RTC 1 mo f/u visit with 1 wk log of sugars to titrate meds accordingly. Pt agrees with plan.

## 2015-12-19 NOTE — Patient Instructions (Addendum)
Continue other diabetes medicines. Start lantus 10 units daily. Sent to pharmacy.  Return in 1 month for follow up with 1 week log of sugars readings (some fasting am and some 2 hours after dinner).  Good to see you today. Let me know if questions in the meantime.

## 2015-12-19 NOTE — Progress Notes (Signed)
Pre visit review using our clinic review tool, if applicable. No additional management support is needed unless otherwise documented below in the visit note. 

## 2015-12-19 NOTE — Assessment & Plan Note (Signed)
Congratulated on weight loss, however unfortunately sugars remain uncontrolled. ?weight loss from hyperglycemia.

## 2015-12-19 NOTE — Progress Notes (Signed)
BP 122/74 (BP Location: Left Arm, Patient Position: Sitting, Cuff Size: Normal)   Pulse 77   Temp 98.5 F (36.9 C) (Oral)   Wt 202 lb 8 oz (91.9 kg)   SpO2 96%   BMI 32.19 kg/m    CC: 6 mo f/u visit Subjective:    Patient ID: Alan Morrison, male    DOB: 1950/10/02, 65 y.o.   MRN: [id number]  HPI: Alan Morrison is a 65 y.o. male presenting on 12/19/2015 for Follow-up (6 month f/u)   DM - regularly does not check sugars. Compliant with antihyperglycemic regimen which includes: actos 30mg  daily, metformin 1000mg  bid, and amaryl 2mg  daily. victoza stopped 05/2015 due to nausea and malaise. Denies low sugars or hypoglycemic symptoms. Denies paresthesias. Last diabetic eye exam 12/2014.  Pneumovax: 2013.  Prevnar: not due yet. Flu shot declined.  Lab Results  Component Value Date   HGBA1C 12.5 (H) 12/10/2015   Diabetic Foot Exam - Simple   No data filed      Cutting down on portion sizes, has lost 11 lbs in last 6 months.  Relevant past medical, surgical, family and social history reviewed and updated as indicated. Interim medical history since our last visit reviewed. Allergies and medications reviewed and updated. Current Outpatient Prescriptions on File Prior to Visit  Medication Sig  . aspirin 81 MG tablet Take 81 mg by mouth daily.    [name] atorvastatin (LIPITOR) 10 MG tablet Take 0.5 tablets (5 mg total) by mouth daily.  . finasteride (PROSCAR) 5 MG tablet Take 1 tablet by mouth  daily  . glimepiride (AMARYL) 2 MG tablet Take 1 tablet (2 mg total) by mouth daily.  [name] glucose blood (ONE TOUCH ULTRA TEST) test strip 1 each by Other route See admin instructions. Check blood sugar 3 times weekly.  . metFORMIN (GLUCOPHAGE) 1000 MG tablet Take 1 tablet (1,000 mg total) by mouth 2 (two) times daily.  . Multiple Vitamin (MULTIVITAMIN) tablet Take 1 tablet by mouth daily.    . pioglitazone (ACTOS) 30 MG tablet Take 1 tablet (30 mg total) by mouth daily.  . probenecid (BENEMID) 500 MG tablet  Take 0.5 tablets (250 mg total) by mouth daily.  . ramipril (ALTACE) 5 MG capsule Take 1 capsule (5 mg total) by mouth daily.  . tamsulosin (FLOMAX) 0.4 MG CAPS capsule Take 1 capsule (0.4 mg total) by mouth daily.  . vitamin C (ASCORBIC ACID) 500 MG tablet Take 500 mg by mouth daily.  [name] VITAMIN D, CHOLECALCIFEROL, PO Take 1 capsule by mouth daily.   No current facility-administered medications on file prior to visit.     Review of Systems Per HPI unless specifically indicated in ROS section     Objective:    BP 122/74 (BP Location: Left Arm, Patient Position: Sitting, Cuff Size: Normal)   Pulse 77   Temp 98.5 F (36.9 C) (Oral)   Wt 202 lb 8 oz (91.9 kg)   SpO2 96%   BMI 32.19 kg/m   Wt Readings from Last 3 Encounters:  12/19/15 202 lb 8 oz (91.9 kg)  06/06/15 213 lb (96.6 kg)  01/03/15 212 lb 8 oz (96.4 kg)    Physical Exam  Constitutional: He appears well-developed and well-nourished. No distress.  HENT:  Head: Normocephalic and atraumatic.  Right Ear: External ear normal.  Left Ear: External ear normal.  Nose: Nose normal.  Mouth/Throat: Oropharynx is clear and moist. No oropharyngeal exudate.  Eyes: Conjunctivae and EOM are normal. Pupils are equal,  round, and reactive to light. No scleral icterus.  Neck: Normal range of motion. Neck supple.  Cardiovascular: Normal rate, regular rhythm, normal heart sounds and intact distal pulses.   No murmur heard. Pulmonary/Chest: Effort normal and breath sounds normal. No respiratory distress. He has no wheezes. He has no rales.  Musculoskeletal: He exhibits no edema.  See HPI for foot exam if done  Lymphadenopathy:    He has no cervical adenopathy.  Skin: Skin is warm and dry. No rash noted.  Psychiatric: He has a normal mood and affect.  Nursing note and vitals reviewed.  Results for orders placed or performed in visit on 12/10/15  TSH  Result Value Ref Range   TSH 1.68 0.35 - 4.50 uIU/mL  Hemoglobin A1c  Result Value  Ref Range   Hgb A1c MFr Bld 12.5 (H) 4.6 - 6.5 %  CBC with Differential/Platelet  Result Value Ref Range   WBC 6.9 4.0 - 10.5 K/uL   RBC 5.19 4.22 - 5.81 Mil/uL   Hemoglobin 16.0 13.0 - 17.0 g/dL   HCT 46.2 39.0 - 52.0 %   MCV 89.1 78.0 - 100.0 fl   MCHC 34.7 30.0 - 36.0 g/dL   RDW 13.2 11.5 - 15.5 %   Platelets 222.0 150.0 - 400.0 K/uL   Neutrophils Relative % 66.3 43.0 - 77.0 %   Lymphocytes Relative 23.9 12.0 - 46.0 %   Monocytes Relative 6.9 3.0 - 12.0 %   Eosinophils Relative 2.6 0.0 - 5.0 %   Basophils Relative 0.3 0.0 - 3.0 %   Neutro Abs 4.6 1.4 - 7.7 K/uL   Lymphs Abs 1.6 0.7 - 4.0 K/uL   Monocytes Absolute 0.5 0.1 - 1.0 K/uL   Eosinophils Absolute 0.2 0.0 - 0.7 K/uL   Basophils Absolute 0.0 0.0 - 0.1 K/uL      Assessment & Plan:   Problem List Items Addressed This Visit    Obesity, Class I, BMI 30-34.9    Congratulated on weight loss, however unfortunately sugars remain uncontrolled. ?weight loss from hyperglycemia.       Relevant Medications   Insulin Glargine (LANTUS SOLOSTAR) 100 UNIT/ML Solostar Pen   Type 2 diabetes, uncontrolled, with neuropathy ([facility]) - Primary    Chronic, deteriorated.  Did not tolerate victoza - nausea. Will start lantus 10u nightly. RTC 1 mo f/u visit with 1 wk log of sugars to titrate meds accordingly. Pt agrees with plan.      Relevant Medications   Insulin Glargine (LANTUS SOLOSTAR) 100 UNIT/ML Solostar Pen    Other Visit Diagnoses   None.      Follow up plan: Return in about 4 weeks (around 01/16/2016), or as needed, for follow up visit.  [name], MD

## 2016-01-08 LAB — HM DIABETES EYE EXAM

## 2016-01-10 ENCOUNTER — Encounter: Payer: Self-pay | Admitting: Family Medicine

## 2016-01-30 ENCOUNTER — Ambulatory Visit (INDEPENDENT_AMBULATORY_CARE_PROVIDER_SITE_OTHER): Payer: 59 | Admitting: Family Medicine

## 2016-01-30 ENCOUNTER — Encounter: Payer: Self-pay | Admitting: Family Medicine

## 2016-01-30 VITALS — BP 132/82 | HR 68 | Temp 97.6°F | Wt 200.8 lb

## 2016-01-30 DIAGNOSIS — E1140 Type 2 diabetes mellitus with diabetic neuropathy, unspecified: Secondary | ICD-10-CM | POA: Diagnosis not present

## 2016-01-30 DIAGNOSIS — B9789 Other viral agents as the cause of diseases classified elsewhere: Secondary | ICD-10-CM | POA: Diagnosis not present

## 2016-01-30 DIAGNOSIS — IMO0002 Reserved for concepts with insufficient information to code with codable children: Secondary | ICD-10-CM

## 2016-01-30 DIAGNOSIS — J069 Acute upper respiratory infection, unspecified: Secondary | ICD-10-CM | POA: Diagnosis not present

## 2016-01-30 DIAGNOSIS — E1165 Type 2 diabetes mellitus with hyperglycemia: Secondary | ICD-10-CM | POA: Diagnosis not present

## 2016-01-30 NOTE — Patient Instructions (Addendum)
Good job with sugars to date. Labs today Increase lantus by 1 unit every 3 days if average fasting sugar over 120, to max 15. Let's try to hold amaryl (glimepiride). Restart if sugars shoot up.  Return in 6 weeks for follow up visit.

## 2016-01-30 NOTE — Progress Notes (Signed)
Pre visit review using our clinic review tool, if applicable. No additional management support is needed unless otherwise documented below in the visit note. 

## 2016-01-30 NOTE — Assessment & Plan Note (Addendum)
Chronic. Tolerating lantus - will likely need to switch to levemir due to insurance coverage. Discussed continued slow titration of lantus to 15u, trial off amaryl. RTC 6 wks f/u visit. Check fructosamine level today.

## 2016-01-30 NOTE — Progress Notes (Signed)
BP 132/82   Pulse 68   Temp 97.6 F (36.4 C) (Oral)   Wt 200 lb 12 oz (91.1 kg)   BMI 31.92 kg/m    CC: 8mo f/u visit Subjective:    Patient ID: Alan Morrison, male    DOB: 21-Feb-1951, 65 y.o.   MRN: [id number]  HPI: Alan Morrison is a 65 y.o. male presenting on 01/30/2016 for Follow-up   1 wk h/o ST, runny nose. No recent fevers. + coughing. Treating with corcedin and aspirin.   See prior note for details. Last visit we started lantus 10u nightly due to persistently uncontrolled diabetes despite actos 30mg , metformin 1000mg  bid and amaryl 2mg  daily. Did not tolerate victoza. Tolerating lantus better but insurance will not cover.   Brings log of sugars with fasting ranging from 125-231, mostly 100s. PM sugars 128-177.   Relevant past medical, surgical, family and social history reviewed and updated as indicated. Interim medical history since our last visit reviewed. Allergies and medications reviewed and updated. Current Outpatient Prescriptions on File Prior to Visit  Medication Sig  . aspirin 81 MG tablet Take 81 mg by mouth daily.    [name] atorvastatin (LIPITOR) 10 MG tablet Take 0.5 tablets (5 mg total) by mouth daily.  . finasteride (PROSCAR) 5 MG tablet Take 1 tablet by mouth  daily  . glimepiride (AMARYL) 2 MG tablet Take 1 tablet (2 mg total) by mouth daily.  [name] glucose blood (ONE TOUCH ULTRA TEST) test strip 1 each by Other route See admin instructions. Check blood sugar 3 times weekly.  . Insulin Glargine (LANTUS SOLOSTAR) 100 UNIT/ML Solostar Pen Inject 10 Units into the skin daily.  . Insulin Pen Needle (PEN NEEDLES 31GX5/16") 31G X 8 MM MISC Use pen needles to inject lantus 10u into skin daily.  . metFORMIN (GLUCOPHAGE) 1000 MG tablet Take 1 tablet (1,000 mg total) by mouth 2 (two) times daily.  . Multiple Vitamin (MULTIVITAMIN) tablet Take 1 tablet by mouth daily.    . pioglitazone (ACTOS) 30 MG tablet Take 1 tablet (30 mg total) by mouth daily.  . probenecid (BENEMID)  500 MG tablet Take 0.5 tablets (250 mg total) by mouth daily.  . ramipril (ALTACE) 5 MG capsule Take 1 capsule (5 mg total) by mouth daily.  . tamsulosin (FLOMAX) 0.4 MG CAPS capsule Take 1 capsule (0.4 mg total) by mouth daily.  . vitamin C (ASCORBIC ACID) 500 MG tablet Take 500 mg by mouth daily.  [name] VITAMIN D, CHOLECALCIFEROL, PO Take 1 capsule by mouth daily.   No current facility-administered medications on file prior to visit.     Review of Systems Per HPI unless specifically indicated in ROS section     Objective:    BP 132/82   Pulse 68   Temp 97.6 F (36.4 C) (Oral)   Wt 200 lb 12 oz (91.1 kg)   BMI 31.92 kg/m   Wt Readings from Last 3 Encounters:  01/30/16 200 lb 12 oz (91.1 kg)  12/19/15 202 lb 8 oz (91.9 kg)  06/06/15 213 lb (96.6 kg)    Physical Exam  Constitutional: He appears well-developed and well-nourished. No distress.  HENT:  Mouth/Throat: Oropharynx is clear and moist. No oropharyngeal exudate.  Cardiovascular: Normal rate, regular rhythm, normal heart sounds and intact distal pulses.   No murmur heard. Pulmonary/Chest: Effort normal and breath sounds normal. No respiratory distress. He has no wheezes. He has no rales.  Skin: Skin is warm and dry. No rash noted.  Psychiatric: He has a normal mood and affect.  Nursing note and vitals reviewed.  Results for orders placed or performed in visit on 01/10/16  [facility] DIABETES EYE EXAM  Result Value Ref Range   [facility] Diabetic Eye Exam No Retinopathy No Retinopathy   Lab Results  Component Value Date   HGBA1C 12.5 (H) 12/10/2015       Assessment & Plan:   Problem List Items Addressed This Visit    Type 2 diabetes, uncontrolled, with neuropathy ([place]) - Primary    Chronic. Tolerating lantus - will likely need to switch to levemir due to insurance coverage. Discussed continued slow titration of lantus to 15u, trial off amaryl. RTC 6 wks f/u visit. Check fructosamine level today.      Relevant Orders    Fructosamine    Other Visit Diagnoses    Viral URI with cough           Follow up plan: Return in about 6 weeks (around 03/12/2016) for follow up visit.  Alan Bush, MD

## 2016-02-04 LAB — FRUCTOSAMINE: FRUCTOSAMINE: 397 umol/L — AB (ref 190–270)

## 2016-02-19 DIAGNOSIS — K760 Fatty (change of) liver, not elsewhere classified: Secondary | ICD-10-CM

## 2016-02-19 DIAGNOSIS — D1771 Benign lipomatous neoplasm of kidney: Secondary | ICD-10-CM

## 2016-02-19 HISTORY — DX: Fatty (change of) liver, not elsewhere classified: K76.0

## 2016-02-19 HISTORY — DX: Benign lipomatous neoplasm of kidney: D17.71

## 2016-03-10 ENCOUNTER — Encounter: Payer: Self-pay | Admitting: Family Medicine

## 2016-03-10 ENCOUNTER — Ambulatory Visit (INDEPENDENT_AMBULATORY_CARE_PROVIDER_SITE_OTHER): Payer: 59 | Admitting: Family Medicine

## 2016-03-10 VITALS — BP 120/80 | HR 78 | Wt 204.0 lb

## 2016-03-10 DIAGNOSIS — D751 Secondary polycythemia: Secondary | ICD-10-CM | POA: Diagnosis not present

## 2016-03-10 DIAGNOSIS — E669 Obesity, unspecified: Secondary | ICD-10-CM | POA: Diagnosis not present

## 2016-03-10 DIAGNOSIS — IMO0002 Reserved for concepts with insufficient information to code with codable children: Secondary | ICD-10-CM

## 2016-03-10 DIAGNOSIS — I10 Essential (primary) hypertension: Secondary | ICD-10-CM

## 2016-03-10 DIAGNOSIS — E1140 Type 2 diabetes mellitus with diabetic neuropathy, unspecified: Secondary | ICD-10-CM

## 2016-03-10 DIAGNOSIS — E1165 Type 2 diabetes mellitus with hyperglycemia: Secondary | ICD-10-CM | POA: Diagnosis not present

## 2016-03-10 LAB — COMPREHENSIVE METABOLIC PANEL
ALBUMIN: 4.2 g/dL (ref 3.5–5.2)
ALK PHOS: 48 U/L (ref 39–117)
ALT: 37 U/L (ref 0–53)
AST: 28 U/L (ref 0–37)
BUN: 23 mg/dL (ref 6–23)
CALCIUM: 9.9 mg/dL (ref 8.4–10.5)
CO2: 30 mEq/L (ref 19–32)
CREATININE: 0.93 mg/dL (ref 0.40–1.50)
Chloride: 101 mEq/L (ref 96–112)
GFR: 86.74 mL/min (ref >60.00)
Glucose, Bld: 256 mg/dL — ABNORMAL HIGH (ref 70–99)
POTASSIUM: 5 meq/L (ref 3.5–5.1)
SODIUM: 137 meq/L (ref 135–145)
TOTAL PROTEIN: 7.1 g/dL (ref 6.0–8.3)
Total Bilirubin: 0.6 mg/dL (ref 0.2–1.2)

## 2016-03-10 LAB — HEMOGLOBIN A1C: HEMOGLOBIN A1C: 10.4 % — AB (ref 4.6–6.5)

## 2016-03-10 MED ORDER — INSULIN GLARGINE 100 UNIT/ML SOLOSTAR PEN: 30.0000 [IU] | PEN_INJECTOR | Freq: Every day | SUBCUTANEOUS | 3 refills | Status: DC

## 2016-03-10 NOTE — Progress Notes (Signed)
BP 120/80   Pulse 78   Wt 204 lb (92.5 kg)   SpO2 98%   BMI 32.43 kg/m    CC: f/u visit Subjective:    Patient ID: Alan Morrison, male    DOB: [birth year], 65 y.o.   MRN: [id number]  HPI: Alan Morrison is a 65 y.o. male presenting on 03/10/2016 for Follow-up   Ongoing hoarse voice, phlegm in throat with mild cough. No ST, HA, fevers/chills, ear or tooth pain..   DM - regularly does check sugars ranging 205-250 - twice daily. Compliant with antihyperglycemic regimen which includes: actos 30mg  daily, metformin 1000mg  bid, glimepiride 2mg  daily, and lantus 14u nightly. Denies low sugars or hypoglycemic symptoms. Denies paresthesias. Last diabetic eye exam 12/2015.  Pneumovax: 2013.  Prevnar: not due yet. No diet changes. Has been hunting more.  Lab Results  Component Value Date   HGBA1C 12.5 (H) 12/10/2015   Diabetic Foot Exam - Simple   Simple Foot Form Diabetic Foot exam was performed with the following findings:  Yes 03/10/2016  9:40 AM  Visual Inspection No deformities, no ulcerations, no other skin breakdown bilaterally:  Yes Sensation Testing Intact to touch and monofilament testing bilaterally:  Yes Pulse Check Posterior Tibialis and Dorsalis pulse intact bilaterally:  Yes Comments     Relevant past medical, surgical, family and social history reviewed and updated as indicated. Interim medical history since our last visit reviewed. Allergies and medications reviewed and updated. Current Outpatient Prescriptions on File Prior to Visit  Medication Sig  . aspirin 81 MG tablet Take 81 mg by mouth daily.    [name] atorvastatin (LIPITOR) 10 MG tablet Take 0.5 tablets (5 mg total) by mouth daily.  . finasteride (PROSCAR) 5 MG tablet Take 1 tablet by mouth  daily  . glimepiride (AMARYL) 2 MG tablet Take 1 tablet (2 mg total) by mouth daily.  [name] glucose blood (ONE TOUCH ULTRA TEST) test strip 1 each by Other route See admin instructions. Check blood sugar 3 times weekly.  . Insulin  Glargine (LANTUS SOLOSTAR) 100 UNIT/ML Solostar Pen Inject 10 Units into the skin daily.  . Insulin Pen Needle (PEN NEEDLES 31GX5/16") 31G X 8 MM MISC Use pen needles to inject lantus 10u into skin daily.  . metFORMIN (GLUCOPHAGE) 1000 MG tablet Take 1 tablet (1,000 mg total) by mouth 2 (two) times daily.  . Multiple Vitamin (MULTIVITAMIN) tablet Take 1 tablet by mouth daily.    . pioglitazone (ACTOS) 30 MG tablet Take 1 tablet (30 mg total) by mouth daily.  . probenecid (BENEMID) 500 MG tablet Take 0.5 tablets (250 mg total) by mouth daily.  . ramipril (ALTACE) 5 MG capsule Take 1 capsule (5 mg total) by mouth daily.  . tamsulosin (FLOMAX) 0.4 MG CAPS capsule Take 1 capsule (0.4 mg total) by mouth daily.  . vitamin C (ASCORBIC ACID) 500 MG tablet Take 500 mg by mouth daily.  [name] VITAMIN D, CHOLECALCIFEROL, PO Take 1 capsule by mouth daily.   No current facility-administered medications on file prior to visit.     Review of Systems Per HPI unless specifically indicated in ROS section     Objective:    BP 120/80   Pulse 78   Wt 204 lb (92.5 kg)   SpO2 98%   BMI 32.43 kg/m   Wt Readings from Last 3 Encounters:  03/10/16 204 lb (92.5 kg)  01/30/16 200 lb 12 oz (91.1 kg)  12/19/15 202 lb 8 oz (91.9 kg)  Physical Exam  Constitutional: He appears well-developed and well-nourished. No distress.  HENT:  Head: Normocephalic and atraumatic.  Right Ear: External ear normal.  Left Ear: External ear normal.  Nose: Nose normal.  Mouth/Throat: Oropharynx is clear and moist. No oropharyngeal exudate.  Eyes: Conjunctivae and EOM are normal. Pupils are equal, round, and reactive to light. No scleral icterus.  Neck: Normal range of motion. Neck supple.  Cardiovascular: Normal rate, regular rhythm, normal heart sounds and intact distal pulses.   No murmur heard. Pulmonary/Chest: Effort normal and breath sounds normal. No respiratory distress. He has no wheezes. He has no rales.    Musculoskeletal: He exhibits no edema.  See HPI for foot exam if done  Lymphadenopathy:    He has no cervical adenopathy.  Skin: Skin is warm and dry. No rash noted.  Psychiatric: He has a normal mood and affect.  Nursing note and vitals reviewed.  Results for orders placed or performed in visit on 01/30/16  Fructosamine  Result Value Ref Range   Fructosamine 397 (H) 190 - 270 umol/L      Assessment & Plan:   Problem List Items Addressed This Visit    Essential hypertension, benign    Chronic, stable. Continue current regimen.      Obesity, Class I, BMI 30-34.9    Mild weight gain noted today.       Relevant Orders   Comprehensive metabolic panel   Polycythemia   Relevant Orders   US Abdomen Complete   Type 2 diabetes, uncontrolled, with neuropathy ([place]) - Primary    Chronic, pt endorses persistently elevated readings despite lantus 14u nightly. Will continue slow taper to 30u daily. Check abd [place] to further eval pancreas in setting of sudden deterioration of sugar control.       Relevant Orders   US Abdomen Complete   Comprehensive metabolic panel   Hemoglobin A1c       Follow up plan: Return in about 3 months (around 06/10/2016) for annual exam, prior fasting for blood work.  [name], MD

## 2016-03-10 NOTE — Assessment & Plan Note (Signed)
Chronic, stable. Continue current regimen. 

## 2016-03-10 NOTE — Assessment & Plan Note (Signed)
Mild weight gain noted today.

## 2016-03-10 NOTE — Progress Notes (Signed)
Pre visit review using our clinic review tool, if applicable. No additional management support is needed unless otherwise documented below in the visit note. 

## 2016-03-10 NOTE — Patient Instructions (Addendum)
We will order abdominal ultrasound. Slowly increase lantus by 1 unit every 3 days if average over 3 days staying >150.  Labs today.  Return in 3 months for physical.

## 2016-03-10 NOTE — Assessment & Plan Note (Signed)
Chronic, pt endorses persistently elevated readings despite lantus 14u nightly. Will continue slow taper to 30u daily. Check abd [place] to further eval pancreas in setting of sudden deterioration of sugar control.

## 2016-03-19 ENCOUNTER — Ambulatory Visit
Admission: RE | Admit: 2016-03-19 | Discharge: 2016-03-19 | Disposition: A | Discharge status: Home / Self Care | Payer: 59 | Source: Ambulatory Visit | Attending: Family Medicine | Admitting: Family Medicine

## 2016-03-19 DIAGNOSIS — E1165 Type 2 diabetes mellitus with hyperglycemia: Secondary | ICD-10-CM

## 2016-03-19 DIAGNOSIS — IMO0002 Reserved for concepts with insufficient information to code with codable children: Secondary | ICD-10-CM

## 2016-03-19 DIAGNOSIS — D751 Secondary polycythemia: Secondary | ICD-10-CM

## 2016-03-19 DIAGNOSIS — E1140 Type 2 diabetes mellitus with diabetic neuropathy, unspecified: Secondary | ICD-10-CM

## 2016-03-21 ENCOUNTER — Encounter: Payer: Self-pay | Admitting: Family Medicine

## 2016-04-09 ENCOUNTER — Telehealth: Payer: Self-pay

## 2016-04-09 MED ORDER — BASAGLAR KWIKPEN 100 UNIT/ML ~~LOC~~ SOPN: 30.0000 [IU] | PEN_INJECTOR | Freq: Every day | SUBCUTANEOUS | 3 refills | Status: DC

## 2016-04-09 NOTE — Telephone Encounter (Signed)
[facility] left v/m; pt got the lantus with a free trial card and was covered at no chg. Now without the free trial card the [facility] ins has lantus listed as non covered med. Basaglar is covered under formulary at $ 0 copay.Please advise.

## 2016-04-09 NOTE — Telephone Encounter (Signed)
plz notify patient if willing we can change lantus to basaglar - sent to pharmacy.

## 2016-04-10 NOTE — Telephone Encounter (Signed)
Left detailed message.   

## 2016-05-20 ENCOUNTER — Telehealth: Payer: Self-pay

## 2016-05-20 NOTE — Telephone Encounter (Signed)
OPENED IN ERROR

## 2016-06-01 ENCOUNTER — Telehealth: Payer: Self-pay

## 2016-06-01 NOTE — Telephone Encounter (Signed)
PLEASE NOTE: All timestamps contained within this report are represented as [place] Standard Time. CONFIDENTIALTY NOTICE: This fax transmission is intended only for the addressee. It contains information that is legally privileged, confidential or otherwise protected from use or disclosure. If you are not the intended recipient, you are strictly prohibited from reviewing, disclosing, copying using or disseminating any of this information or taking any action in reliance on or regarding this information. If you have received this fax in error, please notify us immediately by telephone so that we can arrange for its return to [place]. Phone: [phone], Toll-Free: [phone], Fax: [phone] Page: 1 of 1 Call Id: [id number] [place] Patient Name: Alan Morrison Gender: Male DOB: 1950-09-05 Age: 69 Y 11 M 15 D Return Phone Number: [phone] (Primary), [phone] (Secondary) City/State/Zip: [place] [address] Client [place] Night - Client Client Site [place] Physician [name] - MD Who Is Calling Patient / Member / Family / Caregiver Call Type Triage / Clinical Relationship To Patient Self Return Phone Number [phone] (Primary) Chief Complaint Sore Throat Reason for Call Symptomatic / Request for [place] states has a scratchy throat and runny/stuffy nose; Nurse Assessment Nurse: Markus Daft, RN, Sherre Poot Date/Time (Eastern Time): 05/30/2016 9:05:27 AM Confirm and document reason for call. If symptomatic, describe symptoms. ---Caller states has a scratchy throat and runny/stuffy nose, mild cough. No fever. S/S started Thursday and a little worse yesterday. Throat is not painful. Does the PT have any chronic conditions? (i.e. diabetes, asthma, etc.) ---Yes List chronic conditions.  ---DM Guidelines Guideline Title Affirmed Question Common Cold Cold with no complications (all triage questions negative) Disp. Time Eilene Ghazi Time) Disposition Final User 05/30/2016 9:08:59 [place], RN, [facility] Advice Given Per Guideline HOME CARE: You should be able to treat this at home. REASSURANCE: * It sounds like an uncomplicated cold that we can treat at home. * Colds are caused by viruses, and no medicine or 'shot' will cure an uncomplicated cold. FOR A STUFFY NOSE - USE NASAL WASHES: * Methods: There are several ways to perform nasal irrigation. You can use a saline nasal spray bottle (available over-the-counter), a rubber ear syringe, a medical syringe without the needle, or a NETI POT. TREATMENT FOR ASSOCIATED SYMPTOMS OF COLDS: * For muscle aches, headaches, or moderate fever (over 101 degrees F) (38.9 C) use acetaminophen every 4 hours. * Sore throat: throat lozenges, hard candy or warm chicken broth. * Cough: use cough drops. * Hydrate: drink extra liquids. HUMIDIFIER: If the air in your home is dry, use a humidifier. EXPECTED COURSE: * Nasal discharge 7-14 days * Cough 2-3 weeks. * Fever 2-3 days CALL BACK IF: * Fever lasts over 3 days * Runny nose lasts over 10 days * You become short of breath * You become worse. CARE ADVICE given per Colds (Adult) guideline.

## 2016-06-02 ENCOUNTER — Other Ambulatory Visit: Payer: Self-pay | Admitting: Family Medicine

## 2016-06-02 DIAGNOSIS — E1165 Type 2 diabetes mellitus with hyperglycemia: Principal | ICD-10-CM

## 2016-06-02 DIAGNOSIS — E559 Vitamin D deficiency, unspecified: Secondary | ICD-10-CM

## 2016-06-02 DIAGNOSIS — E785 Hyperlipidemia, unspecified: Secondary | ICD-10-CM

## 2016-06-02 DIAGNOSIS — N401 Enlarged prostate with lower urinary tract symptoms: Secondary | ICD-10-CM

## 2016-06-02 DIAGNOSIS — E1140 Type 2 diabetes mellitus with diabetic neuropathy, unspecified: Secondary | ICD-10-CM

## 2016-06-02 DIAGNOSIS — N138 Other obstructive and reflux uropathy: Secondary | ICD-10-CM

## 2016-06-02 DIAGNOSIS — IMO0002 Reserved for concepts with insufficient information to code with codable children: Secondary | ICD-10-CM

## 2016-06-03 ENCOUNTER — Other Ambulatory Visit: Payer: 59

## 2016-06-09 ENCOUNTER — Encounter: Payer: 59 | Admitting: Family Medicine

## 2016-06-09 ENCOUNTER — Ambulatory Visit (INDEPENDENT_AMBULATORY_CARE_PROVIDER_SITE_OTHER)
Admission: RE | Admit: 2016-06-09 | Discharge: 2016-06-09 | Disposition: A | Discharge status: Home / Self Care | Payer: 59 | Source: Ambulatory Visit | Attending: Family Medicine | Admitting: Family Medicine

## 2016-06-09 ENCOUNTER — Encounter: Payer: Self-pay | Admitting: Family Medicine

## 2016-06-09 ENCOUNTER — Ambulatory Visit (INDEPENDENT_AMBULATORY_CARE_PROVIDER_SITE_OTHER): Payer: 59 | Admitting: Family Medicine

## 2016-06-09 VITALS — BP 120/86 | HR 86 | Temp 98.0°F | Wt 207.0 lb

## 2016-06-09 DIAGNOSIS — M25551 Pain in right hip: Secondary | ICD-10-CM | POA: Insufficient documentation

## 2016-06-09 NOTE — Progress Notes (Signed)
Pre visit review using our clinic review tool, if applicable. No additional management support is needed unless otherwise documented below in the visit note. 

## 2016-06-09 NOTE — Patient Instructions (Addendum)
I think you do have R hip arthritis - xrays today.  You can treat with over the counter aleve once to twice a day with meals as needed, start glucosamine supplement for joint health.  Let us know when you'd like ortho referral.

## 2016-06-09 NOTE — Assessment & Plan Note (Signed)
Story most consistent with R hip osteoarthritis.  Check baseline R hip xray today - some medial arthritis on my read, but not severe. Discussed with patient. rec aleve 1 tab daily with breakfast, start glucosamine supplement. Continue vit D.  Discussed avoiding inciting movements, ortho referral to discuss intra articular injection vs further evaluation - pt desires to hold on this for now. Will let me know if changes mind.

## 2016-06-09 NOTE — Progress Notes (Signed)
BP 120/86 (BP Location: Left Arm, Patient Position: Sitting, Cuff Size: Normal)   Pulse 86   Temp 98 F (36.7 C) (Oral)   Wt 207 lb (93.9 kg)   SpO2 98%   BMI 32.91 kg/m    CC: R groin pain Subjective:    Patient ID: Alan Morrison, male    DOB: 10-Sep-1950, 66 y.o.   MRN: [id number]  HPI: Alan Morrison is a 66 y.o. male presenting on 06/09/2016 for Hip Pain (Right hip/groin pain for several months)   Several month h/o R groin pain more noticeable when crossing his R leg onto left. No pain with walking, sitting, standing, or transitions. Limits ability to put shoe covers on at work.   No significant h/o OA that he knows of.  No inciting trauma/injury. No groin bulge.  No abd pain.  No back pain, no shooting pain down leg.   Relevant past medical, surgical, family and social history reviewed and updated as indicated. Interim medical history since our last visit reviewed. Allergies and medications reviewed and updated. Outpatient Medications Prior to Visit  Medication Sig Dispense Refill  . aspirin 81 MG tablet Take 81 mg by mouth daily.      [name] atorvastatin (LIPITOR) 10 MG tablet Take 0.5 tablets (5 mg total) by mouth daily. 45 tablet 3  . finasteride (PROSCAR) 5 MG tablet Take 1 tablet by mouth  daily 90 tablet 3  . glimepiride (AMARYL) 2 MG tablet Take 1 tablet (2 mg total) by mouth daily. 90 tablet 3  . glucose blood (ONE TOUCH ULTRA TEST) test strip 1 each by Other route See admin instructions. Check blood sugar 3 times weekly.    . Insulin Glargine (BASAGLAR KWIKPEN) 100 UNIT/ML SOPN Inject 0.3 mLs (30 Units total) into the skin daily. 3 pen 3  . Insulin Pen Needle (PEN NEEDLES 31GX5/16") 31G X 8 MM MISC Use pen needles to inject lantus 10u into skin daily. 100 each 1  . metFORMIN (GLUCOPHAGE) 1000 MG tablet Take 1 tablet (1,000 mg total) by mouth 2 (two) times daily. 180 tablet 3  . Multiple Vitamin (MULTIVITAMIN) tablet Take 1 tablet by mouth daily.      . pioglitazone  (ACTOS) 30 MG tablet Take 1 tablet (30 mg total) by mouth daily. 90 tablet 3  . probenecid (BENEMID) 500 MG tablet Take 0.5 tablets (250 mg total) by mouth daily. 45 tablet 1  . ramipril (ALTACE) 5 MG capsule TAKE 1 CAPSULE BY MOUTH  DAILY 90 capsule 1  . tamsulosin (FLOMAX) 0.4 MG CAPS capsule Take 1 capsule (0.4 mg total) by mouth daily. 90 capsule 3  . vitamin C (ASCORBIC ACID) 500 MG tablet Take 500 mg by mouth daily.    [name] VITAMIN D, CHOLECALCIFEROL, PO Take 1 capsule by mouth daily.    . Insulin Glargine (LANTUS SOLOSTAR) 100 UNIT/ML Solostar Pen Inject 30 Units into the skin daily. 3 pen 3   No facility-administered medications prior to visit.      Per HPI unless specifically indicated in ROS section below Review of Systems     Objective:    BP 120/86 (BP Location: Left Arm, Patient Position: Sitting, Cuff Size: Normal)   Pulse 86   Temp 98 F (36.7 C) (Oral)   Wt 207 lb (93.9 kg)   SpO2 98%   BMI 32.91 kg/m   Wt Readings from Last 3 Encounters:  06/09/16 207 lb (93.9 kg)  03/10/16 204 lb (92.5 kg)  01/30/16 200 lb 12  oz (91.1 kg)    Physical Exam  Constitutional: He appears well-developed and well-nourished. No distress.  Abdominal: Hernia confirmed negative in the right inguinal area and confirmed negative in the left inguinal area.  Musculoskeletal: He exhibits no edema.  No pain midline spine Neg SLR bilaterally. ++ pain with int>ext rotation at R hip. + FABER on R. No pain at SIJ, GTB or sciatic notch bilaterally.   Skin: Skin is warm and dry. No rash noted.  Vitals reviewed.  Lab Results  Component Value Date   HGBA1C 10.4 (H) 03/10/2016       Assessment & Plan:   Problem List Items Addressed This Visit    Right hip pain - Primary    Story most consistent with R hip osteoarthritis.  Check baseline R hip xray today - some medial arthritis on my read, but not severe. Discussed with patient. rec aleve 1 tab daily with breakfast, start glucosamine  supplement. Continue vit D.  Discussed avoiding inciting movements, ortho referral to discuss intra articular injection vs further evaluation - pt desires to hold on this for now. Will let me know if changes mind.      Relevant Orders   DG HIP UNILAT WITH PELVIS 2-3 VIEWS RIGHT       Follow up plan: Return if symptoms worsen or fail to improve.  [name], MD

## 2016-07-26 ENCOUNTER — Other Ambulatory Visit: Payer: Self-pay | Admitting: Family Medicine

## 2016-09-03 ENCOUNTER — Telehealth: Payer: Self-pay | Admitting: Family Medicine

## 2016-09-03 NOTE — Telephone Encounter (Signed)
Left message for pt to call Ebony Hail back directly at [phone] and schedule Welcome to Medicare exam (IPPE) with PCP.

## 2016-09-08 NOTE — Telephone Encounter (Signed)
Pt not on medicare yet. Scheduled for regular cpe on 10/22/16

## 2016-10-16 ENCOUNTER — Other Ambulatory Visit (INDEPENDENT_AMBULATORY_CARE_PROVIDER_SITE_OTHER): Payer: 59

## 2016-10-16 DIAGNOSIS — E785 Hyperlipidemia, unspecified: Secondary | ICD-10-CM

## 2016-10-16 DIAGNOSIS — E1165 Type 2 diabetes mellitus with hyperglycemia: Secondary | ICD-10-CM

## 2016-10-16 DIAGNOSIS — E1140 Type 2 diabetes mellitus with diabetic neuropathy, unspecified: Secondary | ICD-10-CM

## 2016-10-16 DIAGNOSIS — E559 Vitamin D deficiency, unspecified: Secondary | ICD-10-CM | POA: Diagnosis not present

## 2016-10-16 DIAGNOSIS — N401 Enlarged prostate with lower urinary tract symptoms: Secondary | ICD-10-CM

## 2016-10-16 DIAGNOSIS — N138 Other obstructive and reflux uropathy: Secondary | ICD-10-CM

## 2016-10-16 DIAGNOSIS — IMO0002 Reserved for concepts with insufficient information to code with codable children: Secondary | ICD-10-CM

## 2016-10-16 LAB — LIPID PANEL
CHOL/HDL RATIO: 4
Cholesterol: 152 mg/dL (ref 0–200)
HDL: 38.8 mg/dL — AB (ref >39.00)
LDL Cholesterol: 99 mg/dL (ref 0–99)
NonHDL: 113.19
TRIGLYCERIDES: 72 mg/dL (ref 0.0–149.0)
VLDL: 14.4 mg/dL (ref 0.0–40.0)

## 2016-10-16 LAB — BASIC METABOLIC PANEL
BUN: 30 mg/dL — AB (ref 6–23)
CHLORIDE: 103 meq/L (ref 96–112)
CO2: 28 meq/L (ref 19–32)
Calcium: 9.3 mg/dL (ref 8.4–10.5)
Creatinine, Ser: 1.09 mg/dL (ref 0.40–1.50)
GFR: 72.09 mL/min (ref >60.00)
GLUCOSE: 195 mg/dL — AB (ref 70–99)
POTASSIUM: 4.5 meq/L (ref 3.5–5.1)
Sodium: 140 mEq/L (ref 135–145)

## 2016-10-16 LAB — VITAMIN D 25 HYDROXY (VIT D DEFICIENCY, FRACTURES): VITD: 34.76 ng/mL (ref 30.00–100.00)

## 2016-10-16 LAB — PSA: PSA: 0.87 ng/mL (ref 0.10–4.00)

## 2016-10-16 LAB — HEMOGLOBIN A1C: Hgb A1c MFr Bld: 12.1 % — ABNORMAL HIGH (ref 4.6–6.5)

## 2016-10-22 ENCOUNTER — Ambulatory Visit (INDEPENDENT_AMBULATORY_CARE_PROVIDER_SITE_OTHER): Payer: 59 | Admitting: Family Medicine

## 2016-10-22 ENCOUNTER — Encounter: Payer: Self-pay | Admitting: Family Medicine

## 2016-10-22 VITALS — BP 142/84 | HR 81 | Temp 98.5°F | Ht 66.5 in | Wt 209.2 lb

## 2016-10-22 DIAGNOSIS — E1140 Type 2 diabetes mellitus with diabetic neuropathy, unspecified: Secondary | ICD-10-CM

## 2016-10-22 DIAGNOSIS — Z0001 Encounter for general adult medical examination with abnormal findings: Secondary | ICD-10-CM | POA: Diagnosis not present

## 2016-10-22 DIAGNOSIS — I10 Essential (primary) hypertension: Secondary | ICD-10-CM

## 2016-10-22 DIAGNOSIS — E669 Obesity, unspecified: Secondary | ICD-10-CM | POA: Diagnosis not present

## 2016-10-22 DIAGNOSIS — I2510 Atherosclerotic heart disease of native coronary artery without angina pectoris: Secondary | ICD-10-CM | POA: Diagnosis not present

## 2016-10-22 DIAGNOSIS — E1165 Type 2 diabetes mellitus with hyperglycemia: Secondary | ICD-10-CM

## 2016-10-22 DIAGNOSIS — R0789 Other chest pain: Secondary | ICD-10-CM | POA: Diagnosis not present

## 2016-10-22 DIAGNOSIS — E785 Hyperlipidemia, unspecified: Secondary | ICD-10-CM | POA: Diagnosis not present

## 2016-10-22 DIAGNOSIS — K760 Fatty (change of) liver, not elsewhere classified: Secondary | ICD-10-CM | POA: Diagnosis not present

## 2016-10-22 DIAGNOSIS — Z23 Encounter for immunization: Secondary | ICD-10-CM | POA: Diagnosis not present

## 2016-10-22 DIAGNOSIS — IMO0002 Reserved for concepts with insufficient information to code with codable children: Secondary | ICD-10-CM

## 2016-10-22 LAB — POC URINALSYSI DIPSTICK (AUTOMATED)
BILIRUBIN UA: NEGATIVE
Blood, UA: NEGATIVE
KETONES UA: NEGATIVE
Leukocytes, UA: NEGATIVE
Nitrite, UA: NEGATIVE
Protein, UA: NEGATIVE
Urobilinogen, UA: 0.2 E.U./dL
pH, UA: 6 (ref 5.0–8.0)

## 2016-10-22 MED ORDER — ATORVASTATIN CALCIUM 10 MG PO TABS: 10.0000 mg | ORAL_TABLET | Freq: Every day | ORAL | 3 refills | Status: DC

## 2016-10-22 NOTE — Assessment & Plan Note (Signed)
Endorses several month history of intermittent chest pressure discomfort associated with meals, without GERD. Known nonobstructive CAD by cath >10 yrs ago. Fmhx CAD. Deteriorated diabetic control in last 1.5 years.  Merits return to cards for further evaluation. Continue aspirin and statin for now.  EKG unchanged from prior 2015.

## 2016-10-22 NOTE — Assessment & Plan Note (Addendum)
Preventative protocols reviewed and updated unless pt declined. Discussed healthy diet and lifestyle.  

## 2016-10-22 NOTE — Assessment & Plan Note (Signed)
Chronic, stable on low dose lipitor. Will increase to 10mg , discussed may ultimately increase to mod intensity 40mg  dose.

## 2016-10-22 NOTE — Assessment & Plan Note (Signed)
Encouraged healthy diet changes to affect sustainable weight loss.

## 2016-10-22 NOTE — Assessment & Plan Note (Signed)
With intermittent chest discomfort, will refer back to cards for further risk stratification.

## 2016-10-22 NOTE — Progress Notes (Signed)
BP (!) 142/84 (BP Location: Left Arm, Patient Position: Sitting, Cuff Size: Normal)   Pulse 81   Temp 98.5 F (36.9 C) (Oral)   Ht 5' 6.5" (1.689 m)   Wt 209 lb 4 oz (94.9 kg)   SpO2 93%   BMI 33.27 kg/m    CC: CPE Subjective:    Patient ID: Alan Morrison, male    DOB: 1951/03/23, 66 y.o.   MRN: [id number]  HPI: Alan Morrison is a 66 y.o. male presenting on 10/22/2016 for Annual Exam   Not feeling well for several months. Dealing with R hip pain. Ongoing R knee pain, neck, occasional chest pressure during or after meals. Overall sedentary lifestyle. Strong fmhx CAD (brother stent at age 51 yo). Endorses h/o CAD, nonobstructive by cath 2000s. Last echo stress test 2012 ([facility]).   DM - markedly deteriorated control over the past year of unclear etiology. Checks cbgs several times a week - 200-300s.  Seen by ortho for ongoing R knee pain. Started on daily anti inflammatory but he doesn't remember name.   Preventative: COLONOSCOPY Date: 07/2013 1 benign polyp rpt 10 yrs [name])  Prostate - normal in the past. Would like to continue checking yearly.  Flu shot yearly at work  Pneumovax 2013, prevnar 2018.  Tdap 2016  Zostavax - 08/2013  Shingrix - interested.  Seat belt use discussed  Sunscreen use discussed. No changing moles.  Non smoker  Alcohol - occasional beer  Lives with wife  Occupation: equipment maintenance  Activity: not walking  Diet: some water, daily fruits/vegetables, diet drinks   Relevant past medical, surgical, family and social history reviewed and updated as indicated. Interim medical history since our last visit reviewed. Allergies and medications reviewed and updated. Outpatient Medications Prior to Visit  Medication Sig Dispense Refill  . aspirin 81 MG tablet Take 81 mg by mouth daily.      . finasteride (PROSCAR) 5 MG tablet TAKE 1 TABLET BY MOUTH  DAILY 90 tablet 1  . glimepiride (AMARYL) 2 MG tablet TAKE 1 TABLET BY MOUTH  DAILY 90 tablet 1  .  glucose blood (ONE TOUCH ULTRA TEST) test strip 1 each by Other route See admin instructions. Check blood sugar 3 times weekly.    . Insulin Glargine (BASAGLAR KWIKPEN) 100 UNIT/ML SOPN Inject 0.3 mLs (30 Units total) into the skin daily. 3 pen 3  . Insulin Pen Needle (PEN NEEDLES 31GX5/16") 31G X 8 MM MISC Use pen needles to inject lantus 10u into skin daily. 100 each 1  . metFORMIN (GLUCOPHAGE) 1000 MG tablet TAKE 1 TABLET BY MOUTH  TWICE A DAY 180 tablet 1  . Multiple Vitamin (MULTIVITAMIN) tablet Take 1 tablet by mouth daily.      . pioglitazone (ACTOS) 30 MG tablet TAKE 1 TABLET BY MOUTH  DAILY 90 tablet 1  . probenecid (BENEMID) 500 MG tablet Take 0.5 tablets (250 mg total) by mouth daily. 45 tablet 1  . ramipril (ALTACE) 5 MG capsule TAKE 1 CAPSULE BY MOUTH  DAILY 90 capsule 1  . tamsulosin (FLOMAX) 0.4 MG CAPS capsule TAKE 1 CAPSULE BY MOUTH  DAILY 90 capsule 1  . vitamin C (ASCORBIC ACID) 500 MG tablet Take 500 mg by mouth daily.    [name] VITAMIN D, CHOLECALCIFEROL, PO Take 1 capsule by mouth daily.    [name] atorvastatin (LIPITOR) 10 MG tablet TAKE ONE-HALF TABLET BY  MOUTH DAILY 45 tablet 1   No facility-administered medications prior to visit.  Per HPI unless specifically indicated in ROS section below Review of Systems  Constitutional: Negative for activity change, appetite change, chills, fatigue, fever and unexpected weight change.  HENT: Negative for hearing loss.   Eyes: Negative for visual disturbance.  Respiratory: Positive for chest tightness and shortness of breath. Negative for cough and wheezing.   Cardiovascular: Negative for chest pain, palpitations and leg swelling.  Gastrointestinal: Negative for abdominal distention, abdominal pain, blood in stool, constipation, diarrhea, nausea and vomiting.  Genitourinary: Negative for difficulty urinating and hematuria.  Musculoskeletal: Negative for arthralgias, myalgias and neck pain.  Skin: Negative for rash.  Neurological:  Negative for dizziness, seizures, syncope and headaches.  Hematological: Negative for adenopathy. Does not bruise/bleed easily.  Psychiatric/Behavioral: Positive for dysphoric mood (more moody with joint pains). The patient is not nervous/anxious.        Objective:    BP (!) 142/84 (BP Location: Left Arm, Patient Position: Sitting, Cuff Size: Normal)   Pulse 81   Temp 98.5 F (36.9 C) (Oral)   Ht 5' 6.5" (1.689 m)   Wt 209 lb 4 oz (94.9 kg)   SpO2 93%   BMI 33.27 kg/m   Wt Readings from Last 3 Encounters:  10/22/16 209 lb 4 oz (94.9 kg)  06/09/16 207 lb (93.9 kg)  03/10/16 204 lb (92.5 kg)    Physical Exam  Constitutional: He is oriented to person, place, and time. He appears well-developed and well-nourished. No distress.  HENT:  Head: Normocephalic and atraumatic.  Right Ear: Hearing, tympanic membrane, external ear and ear canal normal.  Left Ear: Hearing, tympanic membrane, external ear and ear canal normal.  Nose: Nose normal.  Mouth/Throat: Uvula is midline, oropharynx is clear and moist and mucous membranes are normal. No oropharyngeal exudate, posterior oropharyngeal edema or posterior oropharyngeal erythema.  Eyes: Conjunctivae and EOM are normal. Pupils are equal, round, and reactive to light. No scleral icterus.  Neck: Normal range of motion. Neck supple. Carotid bruit is not present. No thyromegaly present.  Cardiovascular: Normal rate, regular rhythm, normal heart sounds and intact distal pulses.   No murmur heard. Pulses:      Radial pulses are 2+ on the right side, and 2+ on the left side.  Pulmonary/Chest: Effort normal and breath sounds normal. No respiratory distress. He has no wheezes. He has no rales.  Abdominal: Soft. Bowel sounds are normal. He exhibits no distension and no mass. There is no tenderness. There is no rebound and no guarding.  Genitourinary: Rectum normal and prostate normal. Rectal exam shows no external hemorrhoid, no internal hemorrhoid, no  fissure, no mass, no tenderness and anal tone normal. Prostate is not enlarged (20gm) and not tender.  Musculoskeletal: Normal range of motion. He exhibits no edema.  Lymphadenopathy:    He has no cervical adenopathy.  Neurological: He is alert and oriented to person, place, and time.  CN grossly intact, station and gait intact  Skin: Skin is warm and dry. No rash noted.  Psychiatric: He has a normal mood and affect. His behavior is normal. Judgment and thought content normal.  Nursing note and vitals reviewed.  Results for orders placed or performed in visit on 10/22/16  POCT Urinalysis Dipstick (Automated)  Result Value Ref Range   Color, UA Yellow    Clarity, UA Clear    Glucose, UA 3+    Bilirubin, UA Neg    Ketones, UA Neg    Spec Grav, UA >=1.030 (A) 1.010 - 1.025   Blood, UA  Neg    pH, UA 6.0 5.0 - 8.0   Protein, UA Neg    Urobilinogen, UA 0.2 0.2 or 1.0 E.U./dL   Nitrite, UA Neg    Leukocytes, UA Negative Negative   Lab Results  Component Value Date   TSH 1.68 12/10/2015    Lab Results  Component Value Date   WBC 6.9 12/10/2015   HGB 16.0 12/10/2015   HCT 46.2 12/10/2015   MCV 89.1 12/10/2015   PLT 222.0 12/10/2015    EKG - NSR rate 80, normal axis, intervals, no acute ST/T changes, poor R wave progression, nonspecific ST changes V1, unchanged from prior EKG 2015.     Assessment & Plan:   Problem List Items Addressed This Visit    Chest discomfort    Endorses several month history of intermittent chest pressure discomfort associated with meals, without GERD. Known nonobstructive CAD by cath >10 yrs ago. Fmhx CAD. Deteriorated diabetic control in last 1.5 years.  Merits return to cards for further evaluation. Continue aspirin and statin for now.  EKG unchanged from prior 2015.       Relevant Orders   EKG 12-Lead (Completed)   Ambulatory referral to Cardiology   CORONARY ATHEROSCLEROSIS NATIVE CORONARY ARTERY    With intermittent chest discomfort, will refer  back to cards for further risk stratification.       Relevant Medications   atorvastatin (LIPITOR) 10 MG tablet   Encounter for general adult medical examination with abnormal findings - Primary    Preventative protocols reviewed and updated unless pt declined. Discussed healthy diet and lifestyle.      Essential hypertension, benign    Chronic, mildly elevated today. No med changes today.      Relevant Medications   atorvastatin (LIPITOR) 10 MG tablet   Fatty liver    Will need to recheck at 3 mo f/u visit.       HLD (hyperlipidemia)    Chronic, stable on low dose lipitor. Will increase to 10mg , discussed may ultimately increase to mod intensity 40mg  dose.       Relevant Medications   atorvastatin (LIPITOR) 10 MG tablet   Obesity, Class I, BMI 30-34.9    Encouraged healthy diet changes to affect sustainable weight loss.       Type 2 diabetes, uncontrolled, with neuropathy (HCC)    Chronic, progressive deterioration over the past 2 yrs despite continued titration of basaglar to 30u daily. He reports compliance with other medications (amaryl, metformin, actos). Anticipate progression of diabetes with pancreas burnt out. Will continue basal insulin titration to 50u. Will refer to endocrinology for further assistance. Pt agrees with plan. RTC 3 mo.  Check urinalysis as pt on actos.      Relevant Medications   atorvastatin (LIPITOR) 10 MG tablet   Other Relevant Orders   POCT Urinalysis Dipstick (Automated) (Completed)   Ambulatory referral to Endocrinology    Other Visit Diagnoses    Need for vaccination with 13-polyvalent pneumococcal conjugate vaccine       Relevant Orders   Pneumococcal conjugate vaccine 13-valent IM (Completed)       Follow up plan: Return in about 3 months (around 01/22/2017) for follow up visit.  [name], MD

## 2016-10-22 NOTE — Patient Instructions (Addendum)
Urinalysis today.  Prevnar today.  EKG today.  Check with pharmacy about new 2 shot shingles series.  Increase lipitor to 10mg  daily for now.  Increase basaglar by 1 unit every 3 days if average fasting sugar >150 to max 50 units. Continue other medicines for now.  We will refer you to endocrinology.   Health Maintenance, Male A healthy lifestyle and preventive care is important for your health and wellness. Ask your health care provider about what schedule of regular examinations is right for you. What should I know about weight and diet? Eat a Healthy Diet  Eat plenty of vegetables, fruits, whole grains, low-fat dairy products, and lean protein.  Do not eat a lot of foods high in solid fats, added sugars, or salt.  Maintain a Healthy Weight Regular exercise can help you achieve or maintain a healthy weight. You should:  Do at least 150 minutes of exercise each week. The exercise should increase your heart rate and make you sweat (moderate-intensity exercise).  Do strength-training exercises at least twice a week.  Watch Your Levels of Cholesterol and Blood Lipids  Have your blood tested for lipids and cholesterol every 5 years starting at 65 years of age. If you are at high risk for heart disease, you should start having your blood tested when you are 65 years old. You may need to have your cholesterol levels checked more often if: ? Your lipid or cholesterol levels are high. ? You are older than 65 years of age. ? You are at high risk for heart disease.  What should I know about cancer screening? Many types of cancers can be detected early and may often be prevented. Lung Cancer  You should be screened every year for lung cancer if: ? You are a current smoker who has smoked for at least 30 years. ? You are a former smoker who has quit within the past 15 years.  Talk to your health care provider about your screening options, when you should start screening, and how often you  should be screened.  Colorectal Cancer  Routine colorectal cancer screening usually begins at 65 years of age and should be repeated every 5-10 years until you are 65 years old. You may need to be screened more often if early forms of precancerous polyps or small growths are found. Your health care provider may recommend screening at an earlier age if you have risk factors for colon cancer.  Your health care provider may recommend using home test kits to check for hidden blood in the stool.  A small camera at the end of a tube can be used to examine your colon (sigmoidoscopy or colonoscopy). This checks for the earliest forms of colorectal cancer.  Prostate and Testicular Cancer  Depending on your age and overall health, your health care provider may do certain tests to screen for prostate and testicular cancer.  Talk to your health care provider about any symptoms or concerns you have about testicular or prostate cancer.  Skin Cancer  Check your skin from head to toe regularly.  Tell your health care provider about any new moles or changes in moles, especially if: ? There is a change in a mole's size, shape, or color. ? You have a mole that is larger than a pencil eraser.  Always use sunscreen. Apply sunscreen liberally and repeat throughout the day.  Protect yourself by wearing long sleeves, pants, a wide-brimmed hat, and sunglasses when outside.  What should I know about  heart disease, diabetes, and high blood pressure?  If you are 73-64 years of age, have your blood pressure checked every 3-5 years. If you are 53 years of age or older, have your blood pressure checked every year. You should have your blood pressure measured twice-once when you are at a hospital or clinic, and once when you are not at a hospital or clinic. Record the average of the two measurements. To check your blood pressure when you are not at a hospital or clinic, you can use: ? An automated blood pressure  machine at a pharmacy. ? A home blood pressure monitor.  Talk to your health care provider about your target blood pressure.  If you are between 69-50 years old, ask your health care provider if you should take aspirin to prevent heart disease.  Have regular diabetes screenings by checking your fasting blood sugar level. ? If you are at a normal weight and have a low risk for diabetes, have this test once every three years after the age of 40. ? If you are overweight and have a high risk for diabetes, consider being tested at a younger age or more often.  A one-time screening for abdominal aortic aneurysm (AAA) by ultrasound is recommended for men aged 41-75 years who are current or former smokers. What should I know about preventing infection? Hepatitis B If you have a higher risk for hepatitis B, you should be screened for this virus. Talk with your health care provider to find out if you are at risk for hepatitis B infection. Hepatitis C Blood testing is recommended for:  Everyone born from [birth year] through 1964.  Anyone with known risk factors for hepatitis C.  Sexually Transmitted Diseases (STDs)  You should be screened each year for STDs including gonorrhea and chlamydia if: ? You are sexually active and are younger than 65 years of age. ? You are older than 65 years of age and your health care provider tells you that you are at risk for this type of infection. ? Your sexual activity has changed since you were last screened and you are at an increased risk for chlamydia or gonorrhea. Ask your health care provider if you are at risk.  Talk with your health care provider about whether you are at high risk of being infected with HIV. Your health care provider may recommend a prescription medicine to help prevent HIV infection.  What else can I do?  Schedule regular health, dental, and eye exams.  Stay current with your vaccines (immunizations).  Do not use any tobacco products,  such as cigarettes, chewing tobacco, and e-cigarettes. If you need help quitting, ask your health care provider.  Limit alcohol intake to no more than 2 drinks per day. One drink equals 12 ounces of beer, 5 ounces of wine, or 1 ounces of hard liquor.  Do not use street drugs.  Do not share needles.  Ask your health care provider for help if you need support or information about quitting drugs.  Tell your health care provider if you often feel depressed.  Tell your health care provider if you have ever been abused or do not feel safe at home. This information is not intended to replace advice given to you by your health care provider. Make sure you discuss any questions you have with your health care provider. Document Released: 10/03/2007 Document Revised: 12/04/2015 Document Reviewed: 01/08/2015 Elsevier Interactive Patient Education  [employer].

## 2016-10-22 NOTE — Assessment & Plan Note (Signed)
Chronic, progressive deterioration over the past 2 yrs despite continued titration of basaglar to 30u daily. He reports compliance with other medications (amaryl, metformin, actos). Anticipate progression of diabetes with pancreas burnt out. Will continue basal insulin titration to 50u. Will refer to endocrinology for further assistance. Pt agrees with plan. RTC 3 mo.  Check urinalysis as pt on actos.

## 2016-10-22 NOTE — Assessment & Plan Note (Signed)
Will need to recheck at 3 mo f/u visit.

## 2016-10-22 NOTE — Assessment & Plan Note (Signed)
Chronic, mildly elevated today. No med changes today.

## 2016-10-27 ENCOUNTER — Encounter: Payer: Self-pay | Admitting: Family Medicine

## 2016-10-27 MED ORDER — BASAGLAR KWIKPEN 100 UNIT/ML ~~LOC~~ SOPN: 30.0000 [IU] | PEN_INJECTOR | Freq: Every day | SUBCUTANEOUS | 3 refills | Status: DC

## 2016-10-27 MED ORDER — TAMSULOSIN HCL 0.4 MG PO CAPS: 0.4000 mg | ORAL_CAPSULE | Freq: Every day | ORAL | 3 refills | Status: DC

## 2016-10-27 MED ORDER — FINASTERIDE 5 MG PO TABS: 5.0000 mg | ORAL_TABLET | Freq: Every day | ORAL | 3 refills | Status: DC

## 2016-10-27 MED ORDER — METFORMIN HCL 1000 MG PO TABS: 1000.0000 mg | ORAL_TABLET | Freq: Two times a day (BID) | ORAL | 3 refills | Status: DC

## 2016-10-27 MED ORDER — PROBENECID 500 MG PO TABS: 250.0000 mg | ORAL_TABLET | Freq: Every day | ORAL | 1 refills | Status: DC

## 2016-10-27 MED ORDER — PIOGLITAZONE HCL 30 MG PO TABS: 30.0000 mg | ORAL_TABLET | Freq: Every day | ORAL | 3 refills | Status: DC

## 2016-10-27 MED ORDER — GLIMEPIRIDE 2 MG PO TABS: 2.0000 mg | ORAL_TABLET | Freq: Every day | ORAL | 3 refills | Status: DC

## 2016-10-27 MED ORDER — RAMIPRIL 5 MG PO CAPS: 5.0000 mg | ORAL_CAPSULE | Freq: Every day | ORAL | 3 refills | Status: DC

## 2016-12-08 ENCOUNTER — Ambulatory Visit (INDEPENDENT_AMBULATORY_CARE_PROVIDER_SITE_OTHER): Payer: 59 | Admitting: Endocrinology

## 2016-12-08 ENCOUNTER — Encounter: Payer: Self-pay | Admitting: Endocrinology

## 2016-12-08 VITALS — BP 142/88 | HR 85 | Wt 217.4 lb

## 2016-12-08 DIAGNOSIS — E1140 Type 2 diabetes mellitus with diabetic neuropathy, unspecified: Secondary | ICD-10-CM | POA: Diagnosis not present

## 2016-12-08 DIAGNOSIS — E1165 Type 2 diabetes mellitus with hyperglycemia: Secondary | ICD-10-CM | POA: Diagnosis not present

## 2016-12-08 DIAGNOSIS — IMO0002 Reserved for concepts with insufficient information to code with codable children: Secondary | ICD-10-CM

## 2016-12-08 LAB — POCT GLYCOSYLATED HEMOGLOBIN (HGB A1C): Hemoglobin A1C: 9.7

## 2016-12-08 MED ORDER — BASAGLAR KWIKPEN 100 UNIT/ML ~~LOC~~ SOPN: 60.0000 [IU] | PEN_INJECTOR | SUBCUTANEOUS | 11 refills | Status: DC

## 2016-12-08 NOTE — Progress Notes (Signed)
Subjective:    Patient ID: Alan Morrison, male    DOB: 1950/08/09, 66 y.o.   MRN: [id number]  HPI pt is referred by Dr Dionisio Paschal, for diabetes.  Pt states DM was dx'ed in 1998; he has mild neuropathy of the lower extremities, and associated CAD; he has been on insulin since early 2018; pt says his diet and exercise are poor; he has never had pancreatitis, pancreatic surgery, severe hypoglycemia or DKA.  He takes basaglar, 42 units qd (he is titrating upwards), and 3 oral meds.  He says cbg's vary from 140-200's.  It is in general higher as the day goes on.  He requests to reduce number of meds.   Past Medical History:  Diagnosis Date  . Anemia, iron deficiency, inadequate dietary intake   . 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   by [place]  . CAD (coronary artery disease) 2003   cath - Min Dz, EF 65%, Adm.R/O'd  . Cervical spondylosis without myelopathy   . Chest pain, atypical   . Choroidal nevus 01/22/2011   left, yearly eye exam, no diabetic retinopathy  . Diabetes mellitus type II   . Dyspepsia   . Ex-smoker   . Fatty liver 02/2016   by [place]  . GERD (gastroesophageal reflux disease)   . Headache(784.0)   . Heart murmur    Hx of  . History of MRI of brain and brain stem 09/06  . History of MRSA infection 12/2013   R knee boil  . HLD (hyperlipidemia)   . Hyperuricemia   . Obesity   . Sleep apnea   . Stress-induced cardiomyopathy 09/27/98   WNL, EF 64%  . Urosepsis 01/01-01/05/10   Hospitalization    Past Surgical History:  Procedure Laterality Date  . COLONOSCOPY  07/2013   1 benign polyp rpt 10 yrs [name])  . KNEE ARTHROSCOPY Left 1988  . KNEE SURGERY Right 05/21/03   Right, Dr. Marlou Sa, med meniscus tear via MRI  . MYELOGRAM  08/05   bulging disc    Social History   Social History  . Marital status: Married    Spouse name: N/A  . Number of children: 2  . Years of education: N/A   Occupational History  . Maintenance [employer]   Social History Main  Topics  . Smoking status: Former Smoker    Quit date: 04/20/1974  . Smokeless tobacco: Never Used     Comment: quit over 20 years  . Alcohol use 1.8 oz/week    3 Cans of beer per week     Comment: occasional  . Drug use: No  . Sexual activity: Not on file   Other Topics Concern  . Not on file   Social History Narrative   Lives with wife   Occupation: equipment maintenance   Activity: no regular exercise - hunts   Diet: good water, fruits/vegetables    Current Outpatient Prescriptions on File Prior to Visit  Medication Sig Dispense Refill  . aspirin 81 MG tablet Take 81 mg by mouth daily.      [name] atorvastatin (LIPITOR) 10 MG tablet Take 1 tablet (10 mg total) by mouth daily. 90 tablet 3  . finasteride (PROSCAR) 5 MG tablet Take 1 tablet (5 mg total) by mouth daily. 90 tablet 3  . glucose blood (ONE TOUCH ULTRA TEST) test strip 1 each by Other route See admin instructions. Check blood sugar 3 times weekly.    . Insulin Pen Needle (PEN NEEDLES 31GX5/16") 31G  X 8 MM MISC Use pen needles to inject lantus 10u into skin daily. 100 each 1  . metFORMIN (GLUCOPHAGE) 1000 MG tablet Take 1 tablet (1,000 mg total) by mouth 2 (two) times daily. 180 tablet 3  . Multiple Vitamin (MULTIVITAMIN) tablet Take 1 tablet by mouth daily.      . probenecid (BENEMID) 500 MG tablet Take 0.5 tablets (250 mg total) by mouth daily. 45 tablet 1  . ramipril (ALTACE) 5 MG capsule Take 1 capsule (5 mg total) by mouth daily. 90 capsule 3  . tamsulosin (FLOMAX) 0.4 MG CAPS capsule Take 1 capsule (0.4 mg total) by mouth daily. 90 capsule 3  . vitamin C (ASCORBIC ACID) 500 MG tablet Take 500 mg by mouth daily.    [name] VITAMIN D, CHOLECALCIFEROL, PO Take 1 capsule by mouth daily.     No current facility-administered medications on file prior to visit.     Allergies  Allergen Reactions  . Victoza [Liraglutide] Nausea Only    Family History  Problem Relation Age of Onset  . Heart disease Mother        CHF  .  Diabetes Mother   . Heart disease Father        CHF  . Hypertension Father   . Migraines Brother        severe headaches from arsenic in the past from  wood that was treated on his deck  . CAD Brother 76       stent  . Cancer Maternal Uncle        unsure  . Stroke Neg Hx   . Colon cancer Neg Hx     BP (!) 142/88   Pulse 85   Wt 217 lb 6.4 oz (98.6 kg)   SpO2 97%   BMI 34.56 kg/m    Review of Systems denies weight loss, blurry vision, headache, chest pain, sob, n/v, urinary frequency, muscle cramps, excessive diaphoresis, depression, cold intolerance, and rhinorrhea.  He has easy bruising.      Objective:   Physical Exam VS: see vs page GEN: no distress HEAD: head: no deformity eyes: no periorbital swelling, no proptosis external nose and ears are normal mouth: no lesion seen NECK: supple, thyroid is not enlarged CHEST WALL: no deformity LUNGS: clear to auscultation CV: reg rate and rhythm, no murmur ABD: abdomen is soft, nontender.  no hepatosplenomegaly.  not distended.  no hernia MUSCULOSKELETAL: muscle bulk and strength are grossly normal.  no obvious joint swelling.  gait is normal and steady EXTEMITIES: no deformity.  no ulcer on the feet.  feet are of normal color and temp.  1+ bilat leg edema.  There is bilateral onychomycosis of the toenails.   PULSES: dorsalis pedis intact bilat.  no carotid bruit NEURO:  cn 2-12 grossly intact.   readily moves all 4's.  sensation is intact to touch on the feet SKIN:  Normal texture and temperature.  No rash or suspicious lesion is visible.   NODES:  None palpable at the neck.  PSYCH: alert, well-oriented.  Does not appear anxious nor depressed.   Lab Results  Component Value Date   HGBA1C 12.1 (H) 10/16/2016    Lab Results  Component Value Date   CREATININE 1.09 10/16/2016   BUN 30 (H) 10/16/2016   NA 140 10/16/2016   K 4.5 10/16/2016   CL 103 10/16/2016   CO2 28 10/16/2016   Lab Results  Component Value Date    MICROALBUR <0.7 05/27/2015   I  personally reviewed electrocardiogram tracing (10/22/16): Indication: DM.  Impression: NSR.  No MI.  No hypertrophy.  PRWP Compared to 2016: no significant change  I have reviewed outside records, and summarized: Pt was noted to have severely elevated a1c, and referred here.  Pt was seen for wellness and arthralgias.       Assessment & Plan:  Insulin-requiring type 2 DM, with CAD: severe exacerbation.  Obesity: new to me: he declines surgery.  Edema: this limits rx options.   Patient Instructions  good diet and exercise significantly improve the control of your diabetes.  please let me know if you wish to be referred to a dietician.  high blood sugar is very risky to your health.  you should see an eye doctor and dentist every year.  It is very important to get all recommended vaccinations.  Controlling your blood pressure and cholesterol drastically reduces the damage diabetes does to your body.  Those who smoke should quit.  Please discuss these with your doctor.  check your blood sugar twice a day.  vary the time of day when you check, between before the 3 meals, and at bedtime.  also check if you have symptoms of your blood sugar being too high or too low.  please keep a record of the readings and bring it to your next appointment here (or you can bring the meter itself).  You can write it on any piece of paper.  please call us sooner if your blood sugar goes below 70, or if you have a lot of readings over 200.   For now, please: Stop taking the pioglitizone and glimepiride, and:  Increase the insulin to 60 units daily.  Please come back for a follow-up appointment in 1 month.  Please call or message [place] next week, to tell us how the blood sugar is doing.

## 2016-12-08 NOTE — Patient Instructions (Addendum)
good diet and exercise significantly improve the control of your diabetes.  please let me know if you wish to be referred to a dietician.  high blood sugar is very risky to your health.  you should see an eye doctor and dentist every year.  It is very important to get all recommended vaccinations.  Controlling your blood pressure and cholesterol drastically reduces the damage diabetes does to your body.  Those who smoke should quit.  Please discuss these with your doctor.  check your blood sugar twice a day.  vary the time of day when you check, between before the 3 meals, and at bedtime.  also check if you have symptoms of your blood sugar being too high or too low.  please keep a record of the readings and bring it to your next appointment here (or you can bring the meter itself).  You can write it on any piece of paper.  please call us sooner if your blood sugar goes below 70, or if you have a lot of readings over 200.   For now, please: Stop taking the pioglitizone and glimepiride, and:  Increase the insulin to 60 units daily.  Please come back for a follow-up appointment in 1 month.  Please call or message [place] next week, to tell us how the blood sugar is doing.

## 2016-12-18 ENCOUNTER — Encounter: Payer: Self-pay | Admitting: Endocrinology

## 2016-12-28 ENCOUNTER — Ambulatory Visit: Payer: 59 | Admitting: Cardiology

## 2016-12-29 ENCOUNTER — Encounter: Payer: Self-pay | Admitting: Family Medicine

## 2016-12-29 LAB — HM DIABETES EYE EXAM

## 2017-01-08 ENCOUNTER — Encounter: Payer: 59 | Admitting: Dietician

## 2017-01-08 ENCOUNTER — Encounter: Payer: Self-pay | Admitting: Endocrinology

## 2017-01-08 ENCOUNTER — Ambulatory Visit (INDEPENDENT_AMBULATORY_CARE_PROVIDER_SITE_OTHER): Payer: 59 | Admitting: Endocrinology

## 2017-01-08 VITALS — BP 132/82 | HR 89 | Wt 215.0 lb

## 2017-01-08 DIAGNOSIS — E1140 Type 2 diabetes mellitus with diabetic neuropathy, unspecified: Secondary | ICD-10-CM

## 2017-01-08 DIAGNOSIS — E1165 Type 2 diabetes mellitus with hyperglycemia: Secondary | ICD-10-CM | POA: Diagnosis not present

## 2017-01-08 DIAGNOSIS — IMO0002 Reserved for concepts with insufficient information to code with codable children: Secondary | ICD-10-CM

## 2017-01-08 MED ORDER — INSULIN LISPRO 100 UNIT/ML (KWIKPEN): 5.0000 [IU] | PEN_INJECTOR | Freq: Three times a day (TID) | SUBCUTANEOUS | 11 refills | Status: DC

## 2017-01-08 NOTE — Progress Notes (Signed)
Subjective:    Patient ID: Alan Morrison, male    DOB: Aug 26, 1950, 66 y.o.   MRN: [id number]  HPI Pt returns for f/u of diabetes mellitus: DM type: Insulin-requiring type 2.  Dx'ed: 4098 Complications: polyneuropathy and CAD Therapy: insulin since early 2018 DKA: never Severe hypoglycemia: never Pancreatitis: never Pancreatic imaging: never Other: Edema is slightly better.  no cbg record, but states cbg's vary from 140-208.  pt states he feels well in general.   Past Medical History:  Diagnosis Date  . Anemia, iron deficiency, inadequate dietary intake   . 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   by [place]  . CAD (coronary artery disease) 2003   cath - Min Dz, EF 65%, Adm.R/O'd  . Cervical spondylosis without myelopathy   . Chest pain, atypical   . Choroidal nevus 01/22/2011   left, yearly eye exam, no diabetic retinopathy  . Diabetes mellitus type II   . Dyspepsia   . Ex-smoker   . Fatty liver 02/2016   by [place]  . GERD (gastroesophageal reflux disease)   . Headache(784.0)   . Heart murmur    Hx of  . History of MRI of brain and brain stem 09/06  . History of MRSA infection 12/2013   R knee boil  . HLD (hyperlipidemia)   . Hyperuricemia   . Obesity   . Sleep apnea   . Stress-induced cardiomyopathy 09/27/98   WNL, EF 64%  . Urosepsis 01/01-01/05/10   Hospitalization    Past Surgical History:  Procedure Laterality Date  . COLONOSCOPY  07/2013   1 benign polyp rpt 10 yrs [name])  . KNEE ARTHROSCOPY Left 1988  . KNEE SURGERY Right 05/21/03   Right, Dr. Marlou Sa, med meniscus tear via MRI  . MYELOGRAM  08/05   bulging disc    Social History   Social History  . Marital status: Married    Spouse name: N/A  . Number of children: 2  . Years of education: N/A   Occupational History  . Maintenance [employer]   Social History Main Topics  . Smoking status: Former Smoker    Quit date: 04/20/1974  . Smokeless tobacco: Never Used     Comment: quit over 20 years   . Alcohol use 1.8 oz/week    3 Cans of beer per week     Comment: occasional  . Drug use: No  . Sexual activity: Not on file   Other Topics Concern  . Not on file   Social History Narrative   Lives with wife   Occupation: equipment maintenance   Activity: no regular exercise - hunts   Diet: good water, fruits/vegetables    Current Outpatient Prescriptions on File Prior to Visit  Medication Sig Dispense Refill  . aspirin 81 MG tablet Take 81 mg by mouth daily.      [name] atorvastatin (LIPITOR) 10 MG tablet Take 1 tablet (10 mg total) by mouth daily. 90 tablet 3  . finasteride (PROSCAR) 5 MG tablet Take 1 tablet (5 mg total) by mouth daily. 90 tablet 3  . glucose blood (ONE TOUCH ULTRA TEST) test strip 1 each by Other route See admin instructions. Check blood sugar 3 times weekly.    . Insulin Glargine (BASAGLAR KWIKPEN) 100 UNIT/ML SOPN Inject 0.6 mLs (60 Units total) into the skin every morning. And pen needles 1/day 10 pen 11  . Insulin Pen Needle (PEN NEEDLES 31GX5/16") 31G X 8 MM MISC Use pen needles to inject lantus 10u  into skin daily. 100 each 1  . metFORMIN (GLUCOPHAGE) 1000 MG tablet Take 1 tablet (1,000 mg total) by mouth 2 (two) times daily. 180 tablet 3  . Multiple Vitamin (MULTIVITAMIN) tablet Take 1 tablet by mouth daily.      . probenecid (BENEMID) 500 MG tablet Take 0.5 tablets (250 mg total) by mouth daily. 45 tablet 1  . ramipril (ALTACE) 5 MG capsule Take 1 capsule (5 mg total) by mouth daily. 90 capsule 3  . tamsulosin (FLOMAX) 0.4 MG CAPS capsule Take 1 capsule (0.4 mg total) by mouth daily. 90 capsule 3  . vitamin C (ASCORBIC ACID) 500 MG tablet Take 500 mg by mouth daily.    [name] VITAMIN D, CHOLECALCIFEROL, PO Take 1 capsule by mouth daily.     No current facility-administered medications on file prior to visit.     Allergies  Allergen Reactions  . Victoza [Liraglutide] Nausea Only    Family History  Problem Relation Age of Onset  . Heart disease Mother         CHF  . Diabetes Mother   . Heart disease Father        CHF  . Hypertension Father   . Migraines Brother        severe headaches from arsenic in the past from  wood that was treated on his deck  . CAD Brother 35       stent  . Cancer Maternal Uncle        unsure  . Stroke Neg Hx   . Colon cancer Neg Hx     BP 132/82   Pulse 89   Wt 215 lb (97.5 kg)   SpO2 96%   BMI 34.18 kg/m   Review of Systems He denies hypoglycemia    Objective:   Physical Exam VITAL SIGNS:  See vs page GENERAL: no distress Pulses: foot pulses are intact bilaterally.   MSK: no deformity of the feet or ankles.  CV: no edema of the legs or ankles Skin:  no ulcer on the feet or ankles.  normal color and temp on the feet and ankles Neuro: sensation is intact to touch on the feet and ankles.   Ext: There is bilateral onychomycosis of the toenails.      Assessment & Plan:  Insulin-requiring type 2 DM, with CAD: he needs increased rx.  Edema: improved off pioglitizone.   Patient Instructions  I have sent a prescription to your pharmacy, to add "humalog." Please continue the same basaglar and metformin.  check your blood sugar twice a day.  vary the time of day when you check, between before the 3 meals, and at bedtime.  also check if you have symptoms of your blood sugar being too high or too low.  please keep a record of the readings and bring it to your next appointment here (or you can bring the meter itself).  You can write it on any piece of paper.  please call us sooner if your blood sugar goes below 70, or if you have a lot of readings over 200. Please come back for a follow-up appointment in 6 weeks.

## 2017-01-08 NOTE — Patient Instructions (Addendum)
I have sent a prescription to your pharmacy, to add "humalog." Please continue the same basaglar and metformin.  check your blood sugar twice a day.  vary the time of day when you check, between before the 3 meals, and at bedtime.  also check if you have symptoms of your blood sugar being too high or too low.  please keep a record of the readings and bring it to your next appointment here (or you can bring the meter itself).  You can write it on any piece of paper.  please call us sooner if your blood sugar goes below 70, or if you have a lot of readings over 200.  Please come back for a follow-up appointment in 6 weeks.

## 2017-01-18 LAB — HM DIABETES EYE EXAM

## 2017-02-11 ENCOUNTER — Encounter: Payer: 59 | Attending: Endocrinology | Admitting: Dietician

## 2017-02-11 ENCOUNTER — Ambulatory Visit: Payer: Self-pay | Admitting: Cardiology

## 2017-02-11 ENCOUNTER — Ambulatory Visit (INDEPENDENT_AMBULATORY_CARE_PROVIDER_SITE_OTHER): Payer: 59 | Admitting: Cardiology

## 2017-02-11 ENCOUNTER — Encounter: Payer: Self-pay | Admitting: Dietician

## 2017-02-11 ENCOUNTER — Encounter: Payer: Self-pay | Admitting: Cardiology

## 2017-02-11 VITALS — BP 138/82 | HR 78 | Ht 68.0 in | Wt 218.0 lb

## 2017-02-11 DIAGNOSIS — I25119 Atherosclerotic heart disease of native coronary artery with unspecified angina pectoris: Secondary | ICD-10-CM | POA: Diagnosis not present

## 2017-02-11 DIAGNOSIS — K219 Gastro-esophageal reflux disease without esophagitis: Secondary | ICD-10-CM | POA: Insufficient documentation

## 2017-02-11 DIAGNOSIS — E7800 Pure hypercholesterolemia, unspecified: Secondary | ICD-10-CM

## 2017-02-11 DIAGNOSIS — G4733 Obstructive sleep apnea (adult) (pediatric): Secondary | ICD-10-CM | POA: Insufficient documentation

## 2017-02-11 DIAGNOSIS — R079 Chest pain, unspecified: Secondary | ICD-10-CM

## 2017-02-11 DIAGNOSIS — I2510 Atherosclerotic heart disease of native coronary artery without angina pectoris: Secondary | ICD-10-CM | POA: Diagnosis not present

## 2017-02-11 DIAGNOSIS — IMO0002 Reserved for concepts with insufficient information to code with codable children: Secondary | ICD-10-CM

## 2017-02-11 DIAGNOSIS — E119 Type 2 diabetes mellitus without complications: Secondary | ICD-10-CM | POA: Insufficient documentation

## 2017-02-11 DIAGNOSIS — E1165 Type 2 diabetes mellitus with hyperglycemia: Secondary | ICD-10-CM

## 2017-02-11 DIAGNOSIS — K760 Fatty (change of) liver, not elsewhere classified: Secondary | ICD-10-CM | POA: Diagnosis not present

## 2017-02-11 DIAGNOSIS — Z794 Long term (current) use of insulin: Secondary | ICD-10-CM | POA: Insufficient documentation

## 2017-02-11 DIAGNOSIS — E785 Hyperlipidemia, unspecified: Secondary | ICD-10-CM | POA: Insufficient documentation

## 2017-02-11 DIAGNOSIS — E1140 Type 2 diabetes mellitus with diabetic neuropathy, unspecified: Secondary | ICD-10-CM

## 2017-02-11 DIAGNOSIS — Z713 Dietary counseling and surveillance: Secondary | ICD-10-CM | POA: Insufficient documentation

## 2017-02-11 MED ORDER — METOPROLOL SUCCINATE ER 50 MG PO TB24: 50.0000 mg | ORAL_TABLET | Freq: Every day | ORAL | 0 refills | Status: DC

## 2017-02-11 NOTE — Patient Instructions (Addendum)
We will schedule you for a CT angiogram of your coronary arteries  We will check your kidney function  Take Toprol XL 50 mg daily for the 3 days prior to your study   Please arrive at the [facility] main entrance of [facility] at xx:xx AM (30-45 minutes prior to test start time)  [facility] [place], [place] [address] [phone]  Proceed to the [facility] Radiology Department (First Floor).  Please follow these instructions carefully (unless otherwise directed):  Hold all erectile dysfunction medications at least 48 hours prior to test.  On the Night Before the Test: . Drink plenty of water. . Do not consume any caffeinated/decaffeinated beverages or chocolate 12 hours prior to your test. . Do not take any antihistamines 12 hours prior to your test. . If you take Metformin do not take 24 hours prior to test. . If the patient has contrast allergy: ? Patient will need a prescription for Prednisone and very clear instructions (as follows): 1. Prednisone 50 mg - take 13 hours prior to test 2. Take another Prednisone 50 mg 7 hours prior to test 3. Take another Prednisone 50 mg 1 hour prior to test 4. Take Benadryl 50 mg 1 hour prior to test . Patient must complete all four doses of above prophylactic medications. . Patient will need a ride after test due to Benadryl.  On the Day of the Test: . Drink plenty of water. Do not drink any water within one hour of the test. . Do not eat any food 4 hours prior to the test. . You may take your regular medications prior to the test. . IF NOT ON A BETA BLOCKER - Take 50 mg of lopressor (metoprolol) one hour before the test. . HOLD Furosemide morning of the test.  After the Test: . Drink plenty of water. . After receiving IV contrast, you may experience a mild flushed feeling. This is normal. . On occasion, you may experience a mild rash up to 24 hours after the test. This is not dangerous. If this  occurs, you can take Benadryl 25 mg and increase your fluid intake. . If you experience trouble breathing, this can be serious. If it is severe call 911 IMMEDIATELY. If it is mild, please call our office. . If you take any of these medications: Glipizide/Metformin, Avandament, Glucavance, please do not take 48 hours after completing test.

## 2017-02-11 NOTE — Progress Notes (Signed)
Diabetes Self-Management Education  Visit Type: First/Initial  Appt. Start Time: 1545 Appt. End Time: 5638  02/11/2017  Alan Morrison, identified by name and date of birth, is a 66 y.o. male with a diagnosis of Diabetes: Type 2. Other hx includes CAD with recent chest pain.  He will be having a test to further determine blockage.  GERD, hyperlipidemia, fatty liver, and OSA on C-pap.   Labs:  10/16/16:  Cholesterol 152, HDL 38, LDL 113, Triglycerides 72, Vitamin D 34.7. Medications include Basaglar 60 units each am, Humalog 5 units tid after meals, Metformin bid.  (Instructed patient to take Humalog prior to eating).  Patient lives with his wife.  They both work.  She does the shopping and cooking.  He works in [employer] and enjoys hunting.  ASSESSMENT  Height 5\' 8"  (1.727 m), weight 216 lb (98 kg). Body mass index is 32.84 kg/m.      Diabetes Self-Management Education - 02/11/17 1559      Visit Information   Visit Type First/Initial     Initial Visit   Diabetes Type Type 2   Are you currently following a meal plan? No   Are you taking your medications as prescribed? Yes   Date Diagnosed [place]   How would you rate your overall health? Fair     Psychosocial Assessment   Patient Belief/Attitude about Diabetes Defeat/Burnout   Self-care barriers None   Self-management support Doctor's office;Family   Other persons present Patient   Patient Concerns Nutrition/Meal planning;Glycemic Control   Special Needs None   Preferred Learning Style No preference indicated   Learning Readiness Contemplating   How often do you need to have someone help you when you read instructions, pamphlets, or other written materials from your doctor or pharmacy? 1 - Never   What is the last grade level you completed in school? 12th grade     Pre-Education Assessment   Patient understands the diabetes disease and treatment process. Needs Review   Patient understands  incorporating nutritional management into lifestyle. Needs Review   Patient undertands incorporating physical activity into lifestyle. Needs Review   Patient understands using medications safely. Needs Review   Patient understands monitoring blood glucose, interpreting and using results Needs Review   Patient understands prevention, detection, and treatment of acute complications. Needs Review   Patient understands prevention, detection, and treatment of chronic complications. Needs Review   Patient understands how to develop strategies to address psychosocial issues. Needs Review   Patient understands how to develop strategies to promote health/change behavior. Needs Review     Complications   Last HgB A1C per patient/outside source 9.7 %  12/08/16 decreased from 12/1% 10/16/16   How often do you check your blood sugar? 3-4 times / week   Fasting Blood glucose range (mg/dL) >200   Postprandial Blood glucose range (mg/dL) >200  250   Number of hypoglycemic episodes per month 0   Number of hyperglycemic episodes per week 21   Can you tell when your blood sugar is high? Yes   What do you do if your blood sugar is high? nothing   Have you had a dilated eye exam in the past 12 months? Yes   Have you had a dental exam in the past 12 months? Yes     Dietary Intake   Breakfast muffin OR nabs OR bacon egg and cheese biscuit   Snack (morning) none   Lunch apple or other  fruit, protein shake ([place])   Snack (afternoon) almonds or trail mix (with candy) or chips and salsa or spinach dip OR cheese and crackers OR pretzels   Dinner pizza OR out to eat (1/2 hamburger and fries or [place] or steak house OR seafood) OR Marie Callendar pot pie OR salmon, baked sweet potato, vegetable OR occasional hot dogs or hamburgers OR grilled chicken salad   Snack (evening) none or occasional popcorn   Beverage(s) water, diet soda (1/day), coffee with cream and splenda or sugar     Exercise   Exercise Type ADL's    How many days per week to you exercise? 0   How many minutes per day do you exercise? 0   Total minutes per week of exercise 0     Patient Education   Previous Diabetes Education Yes (please comment)  1998 when diagnosed   Disease state  Definition of diabetes, type 1 and 2, and the diagnosis of diabetes   Nutrition management  Role of diet in the treatment of diabetes and the relationship between the three main macronutrients and blood glucose level;Food label reading, portion sizes and measuring food.;Reviewed blood glucose goals for pre and post meals and how to evaluate the patients' food intake on their blood glucose level.;Information on hints to eating out and maintain blood glucose control.;Meal options for control of blood glucose level and chronic complications.   Physical activity and exercise  Role of exercise on diabetes management, blood pressure control and cardiac health.;Helped patient identify appropriate exercises in relation to his/her diabetes, diabetes complications and other health issue.   Medications Reviewed patients medication for diabetes, action, purpose, timing of dose and side effects.   Monitoring Purpose and frequency of SMBG.;Identified appropriate SMBG and/or A1C goals.;Other (comment)  Discussed FreeStyle Libre option   Acute complications Taught treatment of hypoglycemia - the 15 rule.;Discussed and identified patients' treatment of hyperglycemia.   Chronic complications Relationship between chronic complications and blood glucose control;Assessed and discussed foot care and prevention of foot problems;Dental care;Retinopathy and reason for yearly dilated eye exams   Psychosocial adjustment Worked with patient to identify barriers to care and solutions;Role of stress on diabetes;Travel strategies;Identified and addressed patients feelings and concerns about diabetes   Personal strategies to promote health Lifestyle issues that need to be addressed for better  diabetes care     Individualized Goals (developed by patient)   Nutrition General guidelines for healthy choices and portions discussed   Physical Activity Exercise 3-5 times per week;30 minutes per day   Medications take my medication as prescribed   Monitoring  test my blood glucose as discussed   Reducing Risk examine blood glucose patterns;do foot checks daily;increase portions of nuts and seeds   Health Coping discuss diabetes with (comment)  MD/RD     Post-Education Assessment   Patient understands the diabetes disease and treatment process. Demonstrates understanding / competency   Patient understands incorporating nutritional management into lifestyle. Demonstrates understanding / competency   Patient undertands incorporating physical activity into lifestyle. Demonstrates understanding / competency   Patient understands using medications safely. Demonstrates understanding / competency   Patient understands monitoring blood glucose, interpreting and using results Demonstrates understanding / competency   Patient understands prevention, detection, and treatment of acute complications. Demonstrates understanding / competency   Patient understands prevention, detection, and treatment of chronic complications. Demonstrates understanding / competency   Patient understands how to develop strategies to address psychosocial issues. Demonstrates understanding / competency   Patient understands how  to develop strategies to promote health/change behavior. Demonstrates understanding / competency     Outcomes   Expected Outcomes Other (comment)  Demonstrated interest in learning but question readiness for change   Future DMSE PRN   Program Status Completed      Individualized Plan for Diabetes Self-Management Training:   Learning Objective:  Patient will have a greater understanding of diabetes self-management. Patient education plan is to attend individual and/or group sessions per  assessed needs and concerns.   Plan:   Patient Instructions  Resume Vitamin D 2,000 units daily. Aim for 30 minutes of exercise most days. Be mindful  Eat slowly, stop when you are satisfied  Read the label for the portion size.  For a snack, put one portion in the bowl and eat at the table.  Eat slowly and enjoy. Consider increasing your non starchy vegetables. Meat portion the size of a deck of cards. Lean meat, skinless chicken.   Aim for 3 Carb Choices per meal (45 grams) +/- 1 either way  Aim for 0-1 Carbs per snack if hungry  Include protein in moderation with your meals and snacks Consider reading food labels for Total Carbohydrate and Fat Grams of foods Consider checking BG at alternate times per day as directed by MD  Consider taking medication as directed by MD      Expected Outcomes:  Other (comment) (Demonstrated interest in learning but question readiness for change)  Education material provided: Living Well with Diabetes, Food label handouts, A1C conversion sheet, Meal plan card, My Plate and Snack sheet  If problems or questions, patient to contact team via:  Phone  Future DSME appointment: PRN

## 2017-02-11 NOTE — Patient Instructions (Signed)
Resume Vitamin D 2,000 units daily. Aim for 30 minutes of exercise most days. Be mindful  Eat slowly, stop when you are satisfied  Read the label for the portion size.  For a snack, put one portion in the bowl and eat at the table.  Eat slowly and enjoy. Consider increasing your non starchy vegetables. Meat portion the size of a deck of cards. Lean meat, skinless chicken.   Aim for 3 Carb Choices per meal (45 grams) +/- 1 either way  Aim for 0-1 Carbs per snack if hungry  Include protein in moderation with your meals and snacks Consider reading food labels for Total Carbohydrate and Fat Grams of foods Consider checking BG at alternate times per day as directed by MD  Consider taking medication as directed by MD

## 2017-02-11 NOTE — Progress Notes (Signed)
Cardiology Office Note   Date:  02/11/2017   ID:  Alan Morrison, DOB 03-06-1951, MRN [id number]  PCP:  [name], MD  Cardiologist:   Raimundo Corbit [place], MD   Chief Complaint  Patient presents with  . Chest Pain      History of Present Illness: Alan Morrison is a 66 y.o. male who is seen at the request of Dr. Danise Mina for evaluation of chest pain. He has a history of DM type 2 and dyslipidemia. He has a history of nonobstructive CAD by cardiac cath in 2003. Myoview study in 2000 was normal.  Last cardiac evaluation with stress Echo in 2012 was normal.   He reports that he has been experiencing some chest discomfort over the past 6 months. No true pain but more of a dull ache. It radiates to his left arm and he feels some tightness in his left neck. Symptoms have been steady over the last 6 months. No diaphoresis, N/V. Does note more SOB. He has gained 8 lbs since on insulin this past year. Risk factors include DM type 2 on insulin, HLD, and HTN. Also strong family history of CAD.   Past Medical History:  Diagnosis Date  . Anemia, iron deficiency, inadequate dietary intake   . 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   by [place]  . CAD (coronary artery disease) 2003   cath - Min Dz, EF 65%, Adm.R/O'd  . Cervical spondylosis without myelopathy   . Chest pain, atypical   . Choroidal nevus 01/22/2011   left, yearly eye exam, no diabetic retinopathy  . Diabetes mellitus type II   . Dyspepsia   . Ex-smoker   . Fatty liver 02/2016   by [place]  . GERD (gastroesophageal reflux disease)   . Headache(784.0)   . Heart murmur    Hx of  . History of MRI of brain and brain stem 09/06  . History of MRSA infection 12/2013   R knee boil  . HLD (hyperlipidemia)   . Hyperuricemia   . Obesity   . Sleep apnea   . Stress-induced cardiomyopathy 09/27/98   WNL, EF 64%  . Urosepsis 01/01-01/05/10   Hospitalization    Past Surgical History:  Procedure Laterality Date  . COLONOSCOPY  07/2013   1 benign polyp rpt 10 yrs [name])  . KNEE ARTHROSCOPY Left 1988  . KNEE SURGERY Right 05/21/03   Right, Dr. Marlou Sa, med meniscus tear via MRI  . MYELOGRAM  08/05   bulging disc     Current Outpatient Prescriptions  Medication Sig Dispense Refill  . aspirin 81 MG tablet Take 81 mg by mouth daily.      [name] atorvastatin (LIPITOR) 10 MG tablet Take 1 tablet (10 mg total) by mouth daily. 90 tablet 3  . finasteride (PROSCAR) 5 MG tablet Take 1 tablet (5 mg total) by mouth daily. 90 tablet 3  . glucose blood (ONE TOUCH ULTRA TEST) test strip 1 each by Other route See admin instructions. Check blood sugar 3 times weekly.    . Insulin Glargine (BASAGLAR KWIKPEN) 100 UNIT/ML SOPN Inject 0.6 mLs (60 Units total) into the skin every morning. And pen needles 1/day 10 pen 11  . insulin lispro (HUMALOG KWIKPEN) 100 UNIT/ML KiwkPen Inject 0.05 mLs (5 Units total) into the skin 3 (three) times daily with meals. And pen needles 4/day 15 mL 11  . Insulin Pen Needle (PEN NEEDLES 31GX5/16") 31G X 8 MM MISC Use pen needles to inject lantus 10u into skin daily.  100 each 1  . metFORMIN (GLUCOPHAGE) 1000 MG tablet Take 1 tablet (1,000 mg total) by mouth 2 (two) times daily. 180 tablet 3  . Multiple Vitamin (MULTIVITAMIN) tablet Take 1 tablet by mouth daily.      . probenecid (BENEMID) 500 MG tablet Take 0.5 tablets (250 mg total) by mouth daily. 45 tablet 1  . ramipril (ALTACE) 5 MG capsule Take 1 capsule (5 mg total) by mouth daily. 90 capsule 3  . tamsulosin (FLOMAX) 0.4 MG CAPS capsule Take 1 capsule (0.4 mg total) by mouth daily. 90 capsule 3  . vitamin C (ASCORBIC ACID) 500 MG tablet Take 500 mg by mouth daily.    [name] VITAMIN D, CHOLECALCIFEROL, PO Take 1 capsule by mouth daily.    . metoprolol succinate (TOPROL-XL) 50 MG 24 hr tablet Take 1 tablet (50 mg total) by mouth daily. Take with or immediately following a meal. 3 tablet 0   No current facility-administered medications for this visit.     Allergies:    Victoza [liraglutide]    Social History:  The patient  reports that he quit smoking about 42 years ago. He has never used smokeless tobacco. He reports that he drinks about 1.8 oz of alcohol per week . He reports that he does not use drugs.   Family History:  The patient's family history includes CAD (age of onset: 4) in his brother; Cancer in his maternal uncle; Diabetes in his mother; Heart disease in his father and mother; Hypertension in his father; Migraines in his brother.    ROS:  Please see the history of present illness.  He does have severe arthritis in his knee and has been told he needs knee replacement. This limits his activity. Otherwise, review of systems are positive for none.   All other systems are reviewed and negative.    PHYSICAL EXAM: VS:  BP 138/82 Comment: Right arm.  Pulse 78   Ht 5\' 8"  (1.727 m)   Wt 218 lb (98.9 kg)   BMI 33.15 kg/m  , BMI Body mass index is 33.15 kg/m. GEN: Well nourished, overweight WM, in no acute distress  HEENT: normal  Neck: no JVD, carotid bruits, or masses Cardiac: RRR; no murmurs, rubs, or gallops,no edema  Respiratory:  clear to auscultation bilaterally, normal work of breathing GI: soft, nontender, nondistended, + BS MS: no deformity or atrophy  Skin: warm and dry, no rash Neuro:  Strength and sensation are intact Psych: euthymic mood, full affect   EKG:  EKG is ordered today. The ekg ordered today demonstrates NSR rate 78. Normal Ecg. I have personally reviewed and interpreted this study.    Recent Labs: 03/10/2016: ALT 37 10/16/2016: BUN 30; Creatinine, Ser 1.09; Potassium 4.5; Sodium 140    Lipid Panel    Component Value Date/Time   CHOL 152 10/16/2016 0840   TRIG 72.0 10/16/2016 0840   HDL 38.80 (L) 10/16/2016 0840   CHOLHDL 4 10/16/2016 0840   VLDL 14.4 10/16/2016 0840   LDLCALC 99 10/16/2016 0840      Wt Readings from Last 3 Encounters:  02/11/17 218 lb (98.9 kg)  01/08/17 215 lb (97.5 kg)  12/08/16  217 lb 6.4 oz (98.6 kg)       ASSESSMENT AND PLAN:  1.  Chest pain concerning for angina pectoris in patient with multiple cardiac risk factors and known history of nonobstructive CAD. Recommend ischemic work up. Unable to walk on a treadmill due to severe arthritis in his knees. We  discussed options of lexiscan Myoview, coronary CTA with FFR, or cardiac cath. I recommended proceeding with CTA. Will check BMET today. Start Toprol XL 50 mg daily prior to study to ensure optimal HR. If CTA shows hemodynamically significant stenosis we may need to pursue cardiac cath. 2. HTN controlled 3. DM type 2 on insulin 4. Hypercholesterolemia. Last LDL 99. With diabetes and high risk for vascular disease goal LDL is 70. I would consider increasing lipitor dose.  5. Osteoarthritis of knees.    Current medicines are reviewed at length with the patient today.  The patient does not have concerns regarding medicines.  The following changes have been made:  no change  Labs/ tests ordered today include: Coronary CTA  Orders Placed This Encounter  Procedures  . Basic metabolic panel  . EKG 12-Lead     Disposition:   FU with me TBD based on CTA.  Signed, Lalania Haseman [place], MD  02/11/2017 12:23 PM    [facility] [address] [place], [place], [address] Phone [phone], Fax [phone]

## 2017-02-11 NOTE — Addendum Note (Signed)
Addended by: Kathyrn Lass on: 02/11/2017 02:01 PM   Modules accepted: Orders

## 2017-02-12 LAB — BASIC METABOLIC PANEL
BUN/Creatinine Ratio: 23 (ref 10–24)
BUN: 23 mg/dL (ref 8–27)
CHLORIDE: 102 mmol/L (ref 96–106)
CO2: 19 mmol/L — AB (ref 20–29)
Calcium: 9.3 mg/dL (ref 8.6–10.2)
Creatinine, Ser: 0.98 mg/dL (ref 0.76–1.27)
GFR calc Af Amer: 93 mL/min/{1.73_m2} (ref >59)
GFR, EST NON AFRICAN AMERICAN: 81 mL/min/{1.73_m2} (ref >59)
GLUCOSE: 227 mg/dL — AB (ref 65–99)
POTASSIUM: 4.3 mmol/L (ref 3.5–5.2)
SODIUM: 140 mmol/L (ref 134–144)

## 2017-02-18 ENCOUNTER — Encounter: Payer: Self-pay | Admitting: Cardiology

## 2017-02-19 ENCOUNTER — Encounter: Payer: Self-pay | Admitting: Endocrinology

## 2017-02-19 ENCOUNTER — Ambulatory Visit (INDEPENDENT_AMBULATORY_CARE_PROVIDER_SITE_OTHER): Payer: 59 | Admitting: Endocrinology

## 2017-02-19 VITALS — BP 140/84 | HR 91 | Wt 217.0 lb

## 2017-02-19 DIAGNOSIS — E1140 Type 2 diabetes mellitus with diabetic neuropathy, unspecified: Secondary | ICD-10-CM

## 2017-02-19 DIAGNOSIS — IMO0002 Reserved for concepts with insufficient information to code with codable children: Secondary | ICD-10-CM

## 2017-02-19 DIAGNOSIS — E1165 Type 2 diabetes mellitus with hyperglycemia: Secondary | ICD-10-CM | POA: Diagnosis not present

## 2017-02-19 LAB — POCT GLYCOSYLATED HEMOGLOBIN (HGB A1C): HEMOGLOBIN A1C: 8.8

## 2017-02-19 MED ORDER — INSULIN LISPRO 100 UNIT/ML (KWIKPEN): PEN_INJECTOR | SUBCUTANEOUS | 11 refills | Status: DC

## 2017-02-19 NOTE — Progress Notes (Signed)
Subjective:    Patient ID: Alan Morrison, male    DOB: [birth year], 66 y.o.   MRN: [id number]  HPI Pt returns for f/u of diabetes mellitus: DM type: Insulin-requiring type 2.  Dx'ed: 7829 Complications: polyneuropathy and CAD Therapy: insulin since early 2018, and metformin.   DKA: never Severe hypoglycemia: never Pancreatitis: never Pancreatic imaging: never Other: he takes multiple daily injections Interval history: Edema is slightly better.  no cbg record, but states cbg's vary from 78-200's.  It is lowest in the afternoon.  He does not check at HS.  pt states he feels well in general.  He takes humalog 8 units 3 times a day (just before each meal).   Past Medical History:  Diagnosis Date  . Anemia, iron deficiency, inadequate dietary intake   . 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   by [place]  . CAD (coronary artery disease) 2003   cath - Min Dz, EF 65%, Adm.R/O'd  . Cervical spondylosis without myelopathy   . Chest pain, atypical   . Choroidal nevus 01/22/2011   left, yearly eye exam, no diabetic retinopathy  . Diabetes mellitus type II   . Dyspepsia   . Ex-smoker   . Fatty liver 02/2016   by [place]  . GERD (gastroesophageal reflux disease)   . Headache(784.0)   . Heart murmur    Hx of  . History of MRI of brain and brain stem 09/06  . History of MRSA infection 12/2013   R knee boil  . HLD (hyperlipidemia)   . Hyperuricemia   . Obesity   . Sleep apnea   . Stress-induced cardiomyopathy 09/27/98   WNL, EF 64%  . Urosepsis 01/01-01/05/10   Hospitalization    Past Surgical History:  Procedure Laterality Date  . COLONOSCOPY  07/2013   1 benign polyp rpt 10 yrs [name])  . KNEE ARTHROSCOPY Left 1988  . KNEE SURGERY Right 05/21/03   Right, Dr. Marlou Sa, med meniscus tear via MRI  . MYELOGRAM  08/05   bulging disc    Social History   Social History  . Marital status: Married    Spouse name: N/A  . Number of children: 2  . Years of education: N/A   Occupational  History  . Maintenance [employer]   Social History Main Topics  . Smoking status: Former Smoker    Quit date: 04/20/1974  . Smokeless tobacco: Never Used     Comment: quit over 20 years  . Alcohol use 1.8 oz/week    3 Cans of beer per week     Comment: occasional  . Drug use: No  . Sexual activity: Not on file   Other Topics Concern  . Not on file   Social History Narrative   Lives with wife   Occupation: equipment maintenance   Activity: no regular exercise - hunts   Diet: good water, fruits/vegetables    Current Outpatient Prescriptions on File Prior to Visit  Medication Sig Dispense Refill  . aspirin 81 MG tablet Take 81 mg by mouth daily.      [name] atorvastatin (LIPITOR) 10 MG tablet Take 1 tablet (10 mg total) by mouth daily. 90 tablet 3  . finasteride (PROSCAR) 5 MG tablet Take 1 tablet (5 mg total) by mouth daily. 90 tablet 3  . glucose blood (ONE TOUCH ULTRA TEST) test strip 1 each by Other route See admin instructions. Check blood sugar 3 times weekly.    . Insulin Glargine (BASAGLAR KWIKPEN) 100 UNIT/ML  SOPN Inject 0.6 mLs (60 Units total) into the skin every morning. And pen needles 1/day 10 pen 11  . Insulin Pen Needle (PEN NEEDLES 31GX5/16") 31G X 8 MM MISC Use pen needles to inject lantus 10u into skin daily. 100 each 1  . metFORMIN (GLUCOPHAGE) 1000 MG tablet Take 1 tablet (1,000 mg total) by mouth 2 (two) times daily. 180 tablet 3  . metoprolol succinate (TOPROL-XL) 50 MG 24 hr tablet Take 1 tablet (50 mg total) by mouth daily. Take with or immediately following a meal. 3 tablet 0  . Multiple Vitamin (MULTIVITAMIN) tablet Take 1 tablet by mouth daily.      . probenecid (BENEMID) 500 MG tablet Take 0.5 tablets (250 mg total) by mouth daily. 45 tablet 1  . ramipril (ALTACE) 5 MG capsule Take 1 capsule (5 mg total) by mouth daily. 90 capsule 3  . tamsulosin (FLOMAX) 0.4 MG CAPS capsule Take 1 capsule (0.4 mg total) by mouth daily. 90 capsule 3  . vitamin C  (ASCORBIC ACID) 500 MG tablet Take 500 mg by mouth daily.    [name] VITAMIN D, CHOLECALCIFEROL, PO Take 1 capsule by mouth daily.     No current facility-administered medications on file prior to visit.     Allergies  Allergen Reactions  . Victoza [Liraglutide] Nausea Only    Family History  Problem Relation Age of Onset  . Heart disease Mother        CHF  . Diabetes Mother   . Heart disease Father        CHF  . Hypertension Father   . Migraines Brother        severe headaches from arsenic in the past from  wood that was treated on his deck  . CAD Brother 87       stent  . Cancer Maternal Uncle        unsure  . Stroke Neg Hx   . Colon cancer Neg Hx     BP 140/84   Pulse 91   Wt 217 lb (98.4 kg)   SpO2 96%   BMI 32.99 kg/m    Review of Systems He denies hypoglycemia.      Objective:   Physical Exam VITAL SIGNS:  See vs page GENERAL: no distress Pulses: foot pulses are intact bilaterally.   MSK: no deformity of the feet or ankles.  CV: 1+ bilat edema of the legs Skin:  no ulcer on the feet or ankles.  normal color and temp on the feet and ankles Neuro: sensation is intact to touch on the feet and ankles.        Assessment & Plan:  Insulin-requiring type 2 DM, with CAD: he needs increased rx.  I advised V-GO.   Patient Instructions  Please increase humalog to 3 times a day (just before each meal), 14-6-14 units, Please continue the same basaglar and metformin.  check your blood sugar twice a day.  vary the time of day when you check, between before the 3 meals, and at bedtime.  also check if you have symptoms of your blood sugar being too high or too low.  please keep a record of the readings and bring it to your next appointment here (or you can bring the meter itself).  You can write it on any piece of paper.  please call us sooner if your blood sugar goes below 70, or if you have a lot of readings over 200.   Please come back for  a follow-up appointment in 3  months.

## 2017-02-19 NOTE — Patient Instructions (Addendum)
Please increase humalog to 3 times a day (just before each meal), 14-6-14 units, Please continue the same basaglar and metformin.  check your blood sugar twice a day.  vary the time of day when you check, between before the 3 meals, and at bedtime.  also check if you have symptoms of your blood sugar being too high or too low.  please keep a record of the readings and bring it to your next appointment here (or you can bring the meter itself).  You can write it on any piece of paper.  please call us sooner if your blood sugar goes below 70, or if you have a lot of readings over 200.   Please come back for a follow-up appointment in 3 months.

## 2017-03-01 ENCOUNTER — Telehealth: Payer: Self-pay | Admitting: Cardiology

## 2017-03-01 NOTE — Telephone Encounter (Signed)
Alan Morrison is returning a call. Please call at [phone]

## 2017-03-01 NOTE — Telephone Encounter (Signed)
Message has been sent in error. New message sent to correct office.

## 2017-03-03 ENCOUNTER — Encounter: Payer: Self-pay | Admitting: Cardiology

## 2017-03-17 ENCOUNTER — Ambulatory Visit (HOSPITAL_COMMUNITY)
Admission: RE | Admit: 2017-03-17 | Discharge: 2017-03-17 | Disposition: A | Discharge status: Home / Self Care | Payer: 59 | Source: Ambulatory Visit | Attending: Cardiology | Admitting: Cardiology

## 2017-03-17 DIAGNOSIS — R079 Chest pain, unspecified: Secondary | ICD-10-CM | POA: Diagnosis not present

## 2017-03-17 DIAGNOSIS — K760 Fatty (change of) liver, not elsewhere classified: Secondary | ICD-10-CM | POA: Diagnosis not present

## 2017-03-17 DIAGNOSIS — I25119 Atherosclerotic heart disease of native coronary artery with unspecified angina pectoris: Secondary | ICD-10-CM | POA: Insufficient documentation

## 2017-03-17 DIAGNOSIS — E7800 Pure hypercholesterolemia, unspecified: Secondary | ICD-10-CM

## 2017-03-17 MED ORDER — METOPROLOL TARTRATE 5 MG/5ML IV SOLN
INTRAVENOUS | Status: AC
  Administered 2017-03-17: 5 mg via INTRAVENOUS
  Filled 2017-03-17: qty 10

## 2017-03-17 MED ORDER — METOPROLOL TARTRATE 5 MG/5ML IV SOLN
5.0000 mg | INTRAVENOUS | Status: DC | PRN
  Administered 2017-03-17: 5 mg via INTRAVENOUS
  Filled 2017-03-17: qty 5

## 2017-03-17 MED ORDER — IOPAMIDOL (ISOVUE-370) INJECTION 76%
INTRAVENOUS | Status: AC
  Administered 2017-03-17: 100 mL
  Filled 2017-03-17: qty 100

## 2017-03-17 MED ORDER — NITROGLYCERIN 0.4 MG SL SUBL
0.4000 mg | SUBLINGUAL_TABLET | Freq: Once | SUBLINGUAL | Status: AC
  Administered 2017-03-17: 0.4 mg via SUBLINGUAL
  Filled 2017-03-17: qty 25

## 2017-03-17 MED ORDER — NITROGLYCERIN 0.4 MG SL SUBL
SUBLINGUAL_TABLET | SUBLINGUAL | Status: AC
  Administered 2017-03-17: 0.4 mg via SUBLINGUAL
  Filled 2017-03-17: qty 1

## 2017-03-19 ENCOUNTER — Encounter: Payer: Self-pay | Admitting: Cardiology

## 2017-03-22 ENCOUNTER — Telehealth: Payer: Self-pay

## 2017-03-22 NOTE — Telephone Encounter (Signed)
Spoke to patient 03/19/17.Cardiac ct results given.Dr.Jordan advised cardiac cath.Cath scheduled with Dr.Jordan 04/01/17 at 12:00 noon.Arrive at [place] at 10:00 am.Advised he needs appointment to update H & P.Appointment scheduled with Almyra Deforest PA 03/25/17 at 11:00 am.Advised he will receive cath instructions and have pre cath labs at visit.  @LOGO @  [place] [address] [place] [address] Dept: [phone] Loc: [place]  03/22/2017  You are scheduled for a cardiac cath on Thursday 04/01/17,  with Dr. Martinique.  1. Please arrive at the [facility] (Main Entrance A) at [facility]: [address] [place], [place] [address] at 10:00 am (two hours before your procedure to ensure your preparation). Free valet parking service is available.   Special note: Every effort is made to have your procedure done on time. Please understand that emergencies sometimes delay scheduled procedures.  2. Diet: Nothing to Eat or Drink after midnight Wed 03/31/17.  3. Labs: ( bmet,cbc,pt ) to be done 03/25/17  4. Medication instructions in preparation for your procedure:     HOLD METFORMIN day before cath and 2 days after cath.      Night before cath take 1/2 of INSULIN dose.     Morning of cath HOLD INSULIN dose.    On the morning of your procedure, take your ASPIRIN 81 mg and any morning medicines NOT listed above.  You may use sips of water.  5. Plan for one night stay--bring personal belongings. 6. Bring a current list of your medications and current insurance cards. 7. You MUST have a responsible person to drive you home. 8. Someone MUST be with you the first 24 hours after you arrive home or your discharge will be delayed. 9. Please wear clothes that are easy to get on and off and wear slip-on shoes.  Thank you for allowing [place] to care for you!   -- [place] [facility]

## 2017-03-25 ENCOUNTER — Ambulatory Visit: Payer: 59 | Admitting: Physician Assistant

## 2017-03-25 VITALS — BP 131/82 | HR 98 | Ht 68.0 in | Wt 213.0 lb

## 2017-03-25 DIAGNOSIS — Z794 Long term (current) use of insulin: Secondary | ICD-10-CM | POA: Diagnosis not present

## 2017-03-25 DIAGNOSIS — Z01810 Encounter for preprocedural cardiovascular examination: Secondary | ICD-10-CM | POA: Diagnosis not present

## 2017-03-25 DIAGNOSIS — I25119 Atherosclerotic heart disease of native coronary artery with unspecified angina pectoris: Secondary | ICD-10-CM

## 2017-03-25 DIAGNOSIS — E119 Type 2 diabetes mellitus without complications: Secondary | ICD-10-CM

## 2017-03-25 DIAGNOSIS — E785 Hyperlipidemia, unspecified: Secondary | ICD-10-CM

## 2017-03-25 DIAGNOSIS — Z79899 Other long term (current) drug therapy: Secondary | ICD-10-CM

## 2017-03-25 DIAGNOSIS — G4733 Obstructive sleep apnea (adult) (pediatric): Secondary | ICD-10-CM | POA: Diagnosis not present

## 2017-03-25 MED ORDER — ATORVASTATIN CALCIUM 40 MG PO TABS: 40.0000 mg | ORAL_TABLET | Freq: Every day | ORAL | 5 refills | Status: DC

## 2017-03-25 MED ORDER — METOPROLOL TARTRATE 25 MG PO TABS: 12.5000 mg | ORAL_TABLET | Freq: Two times a day (BID) | ORAL | 3 refills | Status: DC

## 2017-03-25 NOTE — Patient Instructions (Signed)
Medication Instructions:  INCREASE LIPITOR (ATORVASTATIN) 40MG  DAILY  START METOPROLOL TARTRATE 12.5MG  TWICE DAILY If you need a refill on your cardiac medications before your next appointment, please call your pharmacy.  Labwork: TODAY AND IN 6-8 WEEKS HERE IN OUR OFFICE AT [facility]  Take the provided lab slips for you to take with you to the lab for you blood draw.   You will need to fast. DO NOT EAT OR DRINK PAST MIDNIGHT-THE NIGHT BEFORE.   You may go to any [facility] lab that is convenient for you however, we do have a lab in our office that is able to assist you. You do NOT need an appointment for our lab. Once in our office lobby there is a podium to the right of the check-in desk where you are to sign-in and ring a doorbell to alert [place] you are here. Lab is open Monday-Friday from 8:00am to 4:00pm; and is closed for lunch from 12:45p-1:45pm   Testing/Procedures: Your physician has requested that you have a cardiac catheterization. Cardiac catheterization is used to diagnose and/or treat various heart conditions. Doctors may recommend this procedure for a number of different reasons. The most common reason is to evaluate chest pain. Chest pain can be a symptom of coronary artery disease (CAD), and cardiac catheterization can show whether plaque is narrowing or blocking your heart's arteries. This procedure is also used to evaluate the valves, as well as measure the blood flow and oxygen levels in different parts of your heart. For further information please visit HugeFiesta.[email or web address]. Please follow instruction sheet, as given.  Follow-Up: Your physician wants you to follow-up in: AFTER CATH PROCEDURE  Thank you for choosing [facility] at [facility]!!           [place] [address] [place] [address] Dept: [phone] Loc: [place]               03/22/2017  You are scheduled for a cardiac cath on Thursday 04/01/17,  with Dr. Martinique.  1. Please arrive at the [facility] (Main Entrance A) at [facility]: [address] [place], [place] [address] at 10:00 am (two hours before your procedure to ensure your preparation). Free valet parking service is available.   Special note: Every effort is made to have your procedure done on time. Please understand that emergencies sometimes delay scheduled procedures.  2. Diet: Nothing to Eat or Drink after midnight Wed 03/31/17.  3. Labs: ( bmet,cbc,pt ) to be done 03/25/17  4. Medication instructions in preparation for your procedure:     HOLD METFORMIN day before cath and 2 days after cath.      Night before cath take 1/2 of INSULIN dose.     Morning of cath HOLD INSULIN dose.    On the morning of your procedure, take your ASPIRIN 81 mg and any morning medicines NOT listed above.  You may use sips of water.  5. Plan for one night stay--bring personal belongings. 6. Bring a current list of your medications and current insurance cards. 7. You MUST have a responsible person to drive you home. 8. Someone MUST be with you the first 24 hours after you arrive home or your discharge will be delayed. 9. Please wear clothes that are easy to get on and off and wear slip-on shoes.  Thank you for allowing [place] to care for you!   -- [place] [place] [facility]

## 2017-03-25 NOTE — Progress Notes (Signed)
Cardiology Office Note    Date:  03/27/2017   ID:  Alan Morrison, DOB 30-Apr-1950, MRN [id number]  PCP:  [name], MD  Cardiologist:  Dr. Martinique  Chief Complaint  Patient presents with  . Follow-up    seen for Dr. Martinique.     History of Present Illness:  Alan Morrison is a 66 y.o. male with PMH of DM II, HLD and OSA.  He had a history of nonobstructive CAD by cardiac cath in 2003.  Myoview in 2000 and was normal.  He also had a normal echocardiogram in 2012 as well.  He has been experiencing some chest discomfort for the past 6 months.  He was started on 50 mg Toprol-XL during the last office visit.  Coronary CT showed significant blockage in the mid RCA, mid LAD and the distal LAD.  He presents today for discussion of cardiac catheterization.  We have discussed benefit and risk of the procedure, he is willing to proceed with cardiac catheterization.  He was given only 3 tablets of Toprol-XL prior to recent coronary CT.  I will remove the Toprol-XL, instead I will start the patient on metoprolol tartrate 12.5 mg twice daily.  His last LDL is 99 back in June 2018, I will increase his Lipitor to 40 mg daily and obtain fasting lipid panel and LFT in 6-8 weeks.  Otherwise he will need standard precath orders include a CBC, basic metabolic panel and PT/INR.  His last episode of chest discomfort was this morning, but only lasted seconds at a time, did not occur with exertion.  He denies any prolonged chest discomfort recently.  Otherwise he has no lower extremity edema, orthopnea or PND.    Past Medical History:  Diagnosis Date  . Anemia, iron deficiency, inadequate dietary intake   . 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   by [place]  . CAD (coronary artery disease) 2003   cath - Min Dz, EF 65%, Adm.R/O'd  . Cervical spondylosis without myelopathy   . Chest pain, atypical   . Choroidal nevus 01/22/2011   left, yearly eye exam, no diabetic retinopathy  . Diabetes mellitus type II   .  Dyspepsia   . Ex-smoker   . Fatty liver 02/2016   by [place]  . GERD (gastroesophageal reflux disease)   . Headache(784.0)   . Heart murmur    Hx of  . History of MRI of brain and brain stem 09/06  . History of MRSA infection 12/2013   R knee boil  . HLD (hyperlipidemia)   . Hyperuricemia   . Obesity   . Sleep apnea   . Stress-induced cardiomyopathy 09/27/98   WNL, EF 64%  . Urosepsis 01/01-01/05/10   Hospitalization    Past Surgical History:  Procedure Laterality Date  . COLONOSCOPY  07/2013   1 benign polyp rpt 10 yrs [name])  . KNEE ARTHROSCOPY Left 1988  . KNEE SURGERY Right 05/21/03   Right, Dr. Marlou Sa, med meniscus tear via MRI  . MYELOGRAM  08/05   bulging disc    Current Medications: Outpatient Medications Prior to Visit  Medication Sig Dispense Refill  . finasteride (PROSCAR) 5 MG tablet Take 1 tablet (5 mg total) by mouth daily. 90 tablet 3  . glucose blood (ONE TOUCH ULTRA TEST) test strip 1 each by Other route See admin instructions. Check blood sugar 3 times weekly.    . Insulin Glargine (BASAGLAR KWIKPEN) 100 UNIT/ML SOPN Inject 0.6 mLs (60 Units total) into the skin every  morning. And pen needles 1/day (Patient taking differently: Inject 60 Units into the skin daily. And pen needles 1/day) 10 pen 11  . insulin lispro (HUMALOG KWIKPEN) 100 UNIT/ML KiwkPen 3 times a day (just before each meal), 14-6-14 units, and pen needles 4/day (Patient taking differently: Inject 6-14 Units into the skin 3 (three) times daily before meals. 14 units in the morning, 6 units at lunch, & 14 at night) 15 mL 11  . Insulin Pen Needle (PEN NEEDLES 31GX5/16") 31G X 8 MM MISC Use pen needles to inject lantus 10u into skin daily. 100 each 1  . metFORMIN (GLUCOPHAGE) 1000 MG tablet Take 1 tablet (1,000 mg total) by mouth 2 (two) times daily. 180 tablet 3  . probenecid (BENEMID) 500 MG tablet Take 0.5 tablets (250 mg total) by mouth daily. 45 tablet 1  . ramipril (ALTACE) 5 MG capsule Take 1  capsule (5 mg total) by mouth daily. (Patient taking differently: Take 5 mg by mouth at bedtime. ) 90 capsule 3  . tamsulosin (FLOMAX) 0.4 MG CAPS capsule Take 1 capsule (0.4 mg total) by mouth daily. 90 capsule 3  . vitamin C (ASCORBIC ACID) 500 MG tablet Take 500 mg by mouth every evening.     [name] aspirin 81 MG tablet Take 81 mg by mouth daily.      [name] atorvastatin (LIPITOR) 10 MG tablet Take 1 tablet (10 mg total) by mouth daily. 90 tablet 3  . Multiple Vitamin (MULTIVITAMIN) tablet Take 1 tablet by mouth daily.      [name] VITAMIN D, CHOLECALCIFEROL, PO Take 1 capsule by mouth daily.    . metoprolol succinate (TOPROL-XL) 50 MG 24 hr tablet Take 1 tablet (50 mg total) by mouth daily. Take with or immediately following a meal. 3 tablet 0   No facility-administered medications prior to visit.      Allergies:   Victoza [liraglutide]   Social History   Socioeconomic History  . Marital status: Married    Spouse name: None  . Number of children: 2  . Years of education: None  . Highest education level: None  Social Needs  . Financial resource strain: None  . Food insecurity - worry: None  . Food insecurity - inability: None  . Transportation needs - medical: None  . Transportation needs - non-medical: None  Occupational History  . Occupation: Maintenance    Employer: [employer]  Tobacco Use  . Smoking status: Former Smoker    Last attempt to quit: 04/20/1974    Years since quitting: 42.9  . Smokeless tobacco: Never Used  . Tobacco comment: quit over 20 years  Substance and Sexual Activity  . Alcohol use: Yes    Alcohol/week: 1.8 oz    Types: 3 Cans of beer per week    Comment: occasional  . Drug use: No  . Sexual activity: None  Other Topics Concern  . None  Social History Narrative   Lives with wife   Occupation: equipment maintenance   Activity: no regular exercise - hunts   Diet: good water, fruits/vegetables     Family History:  The patient's family history  includes CAD (age of onset: 37) in his brother; Cancer in his maternal uncle; Diabetes in his mother; Heart disease in his father and mother; Hypertension in his father; Migraines in his brother.   ROS:   Please see the history of present illness.    ROS All other systems reviewed and are negative.   PHYSICAL EXAM:  VS:  BP 131/82   Pulse 98   Ht 5\' 8"  (1.727 m)   Wt 213 lb (96.6 kg)   BMI 32.39 kg/m    GEN: Well nourished, well developed, in no acute distress  HEENT: normal  Neck: no JVD, carotid bruits, or masses Cardiac: RRR; no murmurs, rubs, or gallops,no edema  Respiratory:  clear to auscultation bilaterally, normal work of breathing GI: soft, nontender, nondistended, + BS MS: no deformity or atrophy  Skin: warm and dry, no rash Neuro:  Alert and Oriented x 3, Strength and sensation are intact Psych: euthymic mood, full affect  Wt Readings from Last 3 Encounters:  03/25/17 213 lb (96.6 kg)  02/19/17 217 lb (98.4 kg)  02/11/17 216 lb (98 kg)      Studies/Labs Reviewed:   EKG:  EKG is not ordered today.    Recent Labs: 03/25/2017: BUN 29; Creatinine, Ser 1.27; Hemoglobin 15.6; Platelets 228; Potassium 4.9; Sodium 138   Lipid Panel    Component Value Date/Time   CHOL 152 10/16/2016 0840   TRIG 72.0 10/16/2016 0840   HDL 38.80 (L) 10/16/2016 0840   CHOLHDL 4 10/16/2016 0840   VLDL 14.4 10/16/2016 0840   LDLCALC 99 10/16/2016 0840    Additional studies/ records that were reviewed today include:   Coronary CT with FFR 03/17/2017 IMPRESSION: 1. Coronary artery calcium score 790 Agatston units, placing the patient in the 89th percentile for age and gender. This suggests high risk for future cardiac events.  2. Extensive plaque in the proximal to mid LAD, possible area of moderate stenosis in the mid LAD.  3.  Moderate OM1 with moderate stenosis proximally.  4.  Mild to possibly moderate mid RCA stenosis.  See CT report from 11/28. This is the FFR  result from the 11/28 study.  FFR 0.82 in mid RCA, suggesting borderline significant stenosis. FFR 0.76 in the PDA, suggesting hemodynamically significant disease involving the PDA.  FFR 0.81 in the mid LAD and 0.57 in the distal LAD, suggesting hemodynamically significant disease in the LAD.     ASSESSMENT:    1. Atherosclerosis of native coronary artery of native heart with angina pectoris ([place])   2. Preoperative cardiovascular examination   3. Medication management   4. Hyperlipidemia, unspecified hyperlipidemia type   5. Controlled type 2 diabetes mellitus without complication, with long-term current use of insulin ([place])   6. OSA (obstructive sleep apnea)      PLAN:  In order of problems listed above:  1. CAD: Abnormal coronary CT with positive FFR.  Plan for cardiac catheterization, benefit and risk of the procedure has been discussed with the patient.  - Risk and benefit of procedure explained to the patient who display clear understanding and agree to proceed.  Discussed with patient possible procedural risk include bleeding, vascular injury, renal injury, arrythmia, MI, stroke and loss of limb or life.  -Standard precath workup to include CBC, basic metabolic panel and PT/INR.  Tartrate 12.5 mg twice daily.  Continue aspirin.  2. Hyperlipidemia: Increase Lipitor to 40 mg daily.  3. DM 2: Managed by primary care provider    Medication Adjustments/Labs and Tests Ordered: Current medicines are reviewed at length with the patient today.  Concerns regarding medicines are outlined above.  Medication changes, Labs and Tests ordered today are listed in the Patient Instructions below. Patient Instructions  Medication Instructions:  INCREASE LIPITOR (ATORVASTATIN) 40MG  DAILY  START METOPROLOL TARTRATE 12.5MG  TWICE DAILY If you need a refill on your  cardiac medications before your next appointment, please call your pharmacy.  Labwork: TODAY AND IN 6-8 WEEKS HERE IN  OUR OFFICE AT [facility]  Take the provided lab slips for you to take with you to the lab for you blood draw.   You will need to fast. DO NOT EAT OR DRINK PAST MIDNIGHT-THE NIGHT BEFORE.   You may go to any [facility] lab that is convenient for you however, we do have a lab in our office that is able to assist you. You do NOT need an appointment for our lab. Once in our office lobby there is a podium to the right of the check-in desk where you are to sign-in and ring a doorbell to alert [place] you are here. Lab is open Monday-Friday from 8:00am to 4:00pm; and is closed for lunch from 12:45p-1:45pm   Testing/Procedures: Your physician has requested that you have a cardiac catheterization. Cardiac catheterization is used to diagnose and/or treat various heart conditions. Doctors may recommend this procedure for a number of different reasons. The most common reason is to evaluate chest pain. Chest pain can be a symptom of coronary artery disease (CAD), and cardiac catheterization can show whether plaque is narrowing or blocking your heart's arteries. This procedure is also used to evaluate the valves, as well as measure the blood flow and oxygen levels in different parts of your heart. For further information please visit HugeFiesta.tn. Please follow instruction sheet, as given.  Follow-Up: Your physician wants you to follow-up in: AFTER CATH PROCEDURE  Thank you for choosing [facility] at [facility]!!           [place] [address] [place] [address] Dept: [phone] Loc: [place]              03/22/2017  You are scheduled for a cardiac cath on Thursday 04/01/17,  with Dr. Martinique.  1. Please arrive at the [facility] (Main Entrance A) at [facility]: [address] [place], [place] [address] at 10:00 am (two hours before your procedure to ensure your  preparation). Free valet parking service is available.   Special note: Every effort is made to have your procedure done on time. Please understand that emergencies sometimes delay scheduled procedures.  2. Diet: Nothing to Eat or Drink after midnight Wed 03/31/17.  3. Labs: ( bmet,cbc,pt ) to be done 03/25/17  4. Medication instructions in preparation for your procedure:     HOLD METFORMIN day before cath and 2 days after cath.      Night before cath take 1/2 of INSULIN dose.     Morning of cath HOLD INSULIN dose.    On the morning of your procedure, take your ASPIRIN 81 mg and any morning medicines NOT listed above.  You may use sips of water.  5. Plan for one night stay--bring personal belongings. 6. Bring a current list of your medications and current insurance cards. 7. You MUST have a responsible person to drive you home. 8. Someone MUST be with you the first 24 hours after you arrive home or your discharge will be delayed. 9. Please wear clothes that are easy to get on and off and wear slip-on shoes.  Thank you for allowing [place] to care for you!   -- [facility] services      Signed, Almyra Deforest, [place]  03/27/2017 10:21 AM    [place]  [place], [place], [place]  [address] Phone: [phone]; Fax: [phone]

## 2017-03-25 NOTE — H&P (View-Only) (Signed)
Cardiology Office Note    Date:  03/27/2017   ID:  Alan Morrison, DOB 1950/08/18, MRN [id number]  PCP:  [name], MD  Cardiologist:  Dr. Martinique  Chief Complaint  Patient presents with  . Follow-up    seen for Dr. Martinique.     History of Present Illness:  Alan Morrison is a 66 y.o. male with PMH of DM II, HLD and OSA.  He had a history of nonobstructive CAD by cardiac cath in 2003.  Myoview in 2000 and was normal.  He also had a normal echocardiogram in 2012 as well.  He has been experiencing some chest discomfort for the past 6 months.  He was started on 50 mg Toprol-XL during the last office visit.  Coronary CT showed significant blockage in the mid RCA, mid LAD and the distal LAD.  He presents today for discussion of cardiac catheterization.  We have discussed benefit and risk of the procedure, he is willing to proceed with cardiac catheterization.  He was given only 3 tablets of Toprol-XL prior to recent coronary CT.  I will remove the Toprol-XL, instead I will start the patient on metoprolol tartrate 12.5 mg twice daily.  His last LDL is 99 back in June 2018, I will increase his Lipitor to 40 mg daily and obtain fasting lipid panel and LFT in 6-8 weeks.  Otherwise he will need standard precath orders include a CBC, basic metabolic panel and PT/INR.  His last episode of chest discomfort was this morning, but only lasted seconds at a time, did not occur with exertion.  He denies any prolonged chest discomfort recently.  Otherwise he has no lower extremity edema, orthopnea or PND.    Past Medical History:  Diagnosis Date  . Anemia, iron deficiency, inadequate dietary intake   . 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   by [place]  . CAD (coronary artery disease) 2003   cath - Min Dz, EF 65%, Adm.R/O'd  . Cervical spondylosis without myelopathy   . Chest pain, atypical   . Choroidal nevus 01/22/2011   left, yearly eye exam, no diabetic retinopathy  . Diabetes mellitus type II   .  Dyspepsia   . Ex-smoker   . Fatty liver 02/2016   by [place]  . GERD (gastroesophageal reflux disease)   . Headache(784.0)   . Heart murmur    Hx of  . History of MRI of brain and brain stem 09/06  . History of MRSA infection 12/2013   R knee boil  . HLD (hyperlipidemia)   . Hyperuricemia   . Obesity   . Sleep apnea   . Stress-induced cardiomyopathy 09/27/98   WNL, EF 64%  . Urosepsis 01/01-01/05/10   Hospitalization    Past Surgical History:  Procedure Laterality Date  . COLONOSCOPY  07/2013   1 benign polyp rpt 10 yrs [name])  . KNEE ARTHROSCOPY Left 1988  . KNEE SURGERY Right 05/21/03   Right, Dr. Marlou Sa, med meniscus tear via MRI  . MYELOGRAM  08/05   bulging disc    Current Medications: Outpatient Medications Prior to Visit  Medication Sig Dispense Refill  . finasteride (PROSCAR) 5 MG tablet Take 1 tablet (5 mg total) by mouth daily. 90 tablet 3  . glucose blood (ONE TOUCH ULTRA TEST) test strip 1 each by Other route See admin instructions. Check blood sugar 3 times weekly.    . Insulin Glargine (BASAGLAR KWIKPEN) 100 UNIT/ML SOPN Inject 0.6 mLs (60 Units total) into the skin every  morning. And pen needles 1/day (Patient taking differently: Inject 60 Units into the skin daily. And pen needles 1/day) 10 pen 11  . insulin lispro (HUMALOG KWIKPEN) 100 UNIT/ML KiwkPen 3 times a day (just before each meal), 14-6-14 units, and pen needles 4/day (Patient taking differently: Inject 6-14 Units into the skin 3 (three) times daily before meals. 14 units in the morning, 6 units at lunch, & 14 at night) 15 mL 11  . Insulin Pen Needle (PEN NEEDLES 31GX5/16") 31G X 8 MM MISC Use pen needles to inject lantus 10u into skin daily. 100 each 1  . metFORMIN (GLUCOPHAGE) 1000 MG tablet Take 1 tablet (1,000 mg total) by mouth 2 (two) times daily. 180 tablet 3  . probenecid (BENEMID) 500 MG tablet Take 0.5 tablets (250 mg total) by mouth daily. 45 tablet 1  . ramipril (ALTACE) 5 MG capsule Take 1  capsule (5 mg total) by mouth daily. (Patient taking differently: Take 5 mg by mouth at bedtime. ) 90 capsule 3  . tamsulosin (FLOMAX) 0.4 MG CAPS capsule Take 1 capsule (0.4 mg total) by mouth daily. 90 capsule 3  . vitamin C (ASCORBIC ACID) 500 MG tablet Take 500 mg by mouth every evening.     [name] aspirin 81 MG tablet Take 81 mg by mouth daily.      [name] atorvastatin (LIPITOR) 10 MG tablet Take 1 tablet (10 mg total) by mouth daily. 90 tablet 3  . Multiple Vitamin (MULTIVITAMIN) tablet Take 1 tablet by mouth daily.      [name] VITAMIN D, CHOLECALCIFEROL, PO Take 1 capsule by mouth daily.    . metoprolol succinate (TOPROL-XL) 50 MG 24 hr tablet Take 1 tablet (50 mg total) by mouth daily. Take with or immediately following a meal. 3 tablet 0   No facility-administered medications prior to visit.      Allergies:   Victoza [liraglutide]   Social History   Socioeconomic History  . Marital status: Married    Spouse name: None  . Number of children: 2  . Years of education: None  . Highest education level: None  Social Needs  . Financial resource strain: None  . Food insecurity - worry: None  . Food insecurity - inability: None  . Transportation needs - medical: None  . Transportation needs - non-medical: None  Occupational History  . Occupation: Maintenance    Employer: [employer]  Tobacco Use  . Smoking status: Former Smoker    Last attempt to quit: 04/20/1974    Years since quitting: 42.9  . Smokeless tobacco: Never Used  . Tobacco comment: quit over 20 years  Substance and Sexual Activity  . Alcohol use: Yes    Alcohol/week: 1.8 oz    Types: 3 Cans of beer per week    Comment: occasional  . Drug use: No  . Sexual activity: None  Other Topics Concern  . None  Social History Narrative   Lives with wife   Occupation: equipment maintenance   Activity: no regular exercise - hunts   Diet: good water, fruits/vegetables     Family History:  The patient's family history  includes CAD (age of onset: 34) in his brother; Cancer in his maternal uncle; Diabetes in his mother; Heart disease in his father and mother; Hypertension in his father; Migraines in his brother.   ROS:   Please see the history of present illness.    ROS All other systems reviewed and are negative.   PHYSICAL EXAM:  VS:  BP 131/82   Pulse 98   Ht 5\' 8"  (1.727 m)   Wt 213 lb (96.6 kg)   BMI 32.39 kg/m    GEN: Well nourished, well developed, in no acute distress  HEENT: normal  Neck: no JVD, carotid bruits, or masses Cardiac: RRR; no murmurs, rubs, or gallops,no edema  Respiratory:  clear to auscultation bilaterally, normal work of breathing GI: soft, nontender, nondistended, + BS MS: no deformity or atrophy  Skin: warm and dry, no rash Neuro:  Alert and Oriented x 3, Strength and sensation are intact Psych: euthymic mood, full affect  Wt Readings from Last 3 Encounters:  03/25/17 213 lb (96.6 kg)  02/19/17 217 lb (98.4 kg)  02/11/17 216 lb (98 kg)      Studies/Labs Reviewed:   EKG:  EKG is not ordered today.    Recent Labs: 03/25/2017: BUN 29; Creatinine, Ser 1.27; Hemoglobin 15.6; Platelets 228; Potassium 4.9; Sodium 138   Lipid Panel    Component Value Date/Time   CHOL 152 10/16/2016 0840   TRIG 72.0 10/16/2016 0840   HDL 38.80 (L) 10/16/2016 0840   CHOLHDL 4 10/16/2016 0840   VLDL 14.4 10/16/2016 0840   LDLCALC 99 10/16/2016 0840    Additional studies/ records that were reviewed today include:   Coronary CT with FFR 03/17/2017 IMPRESSION: 1. Coronary artery calcium score 790 Agatston units, placing the patient in the 89th percentile for age and gender. This suggests high risk for future cardiac events.  2. Extensive plaque in the proximal to mid LAD, possible area of moderate stenosis in the mid LAD.  3.  Moderate OM1 with moderate stenosis proximally.  4.  Mild to possibly moderate mid RCA stenosis.  See CT report from 11/28. This is the FFR  result from the 11/28 study.  FFR 0.82 in mid RCA, suggesting borderline significant stenosis. FFR 0.76 in the PDA, suggesting hemodynamically significant disease involving the PDA.  FFR 0.81 in the mid LAD and 0.57 in the distal LAD, suggesting hemodynamically significant disease in the LAD.     ASSESSMENT:    1. Atherosclerosis of native coronary artery of native heart with angina pectoris ([place])   2. Preoperative cardiovascular examination   3. Medication management   4. Hyperlipidemia, unspecified hyperlipidemia type   5. Controlled type 2 diabetes mellitus without complication, with long-term current use of insulin ([place])   6. OSA (obstructive sleep apnea)      PLAN:  In order of problems listed above:  1. CAD: Abnormal coronary CT with positive FFR.  Plan for cardiac catheterization, benefit and risk of the procedure has been discussed with the patient.  - Risk and benefit of procedure explained to the patient who display clear understanding and agree to proceed.  Discussed with patient possible procedural risk include bleeding, vascular injury, renal injury, arrythmia, MI, stroke and loss of limb or life.  -Standard precath workup to include CBC, basic metabolic panel and PT/INR.  Tartrate 12.5 mg twice daily.  Continue aspirin.  2. Hyperlipidemia: Increase Lipitor to 40 mg daily.  3. DM 2: Managed by primary care provider    Medication Adjustments/Labs and Tests Ordered: Current medicines are reviewed at length with the patient today.  Concerns regarding medicines are outlined above.  Medication changes, Labs and Tests ordered today are listed in the Patient Instructions below. Patient Instructions  Medication Instructions:  INCREASE LIPITOR (ATORVASTATIN) 40MG  DAILY  START METOPROLOL TARTRATE 12.5MG  TWICE DAILY If you need a refill on your  cardiac medications before your next appointment, please call your pharmacy.  Labwork: TODAY AND IN 6-8 WEEKS HERE IN  OUR OFFICE AT [facility]  Take the provided lab slips for you to take with you to the lab for you blood draw.   You will need to fast. DO NOT EAT OR DRINK PAST MIDNIGHT-THE NIGHT BEFORE.   You may go to any [facility] lab that is convenient for you however, we do have a lab in our office that is able to assist you. You do NOT need an appointment for our lab. Once in our office lobby there is a podium to the right of the check-in desk where you are to sign-in and ring a doorbell to alert [place] you are here. Lab is open Monday-Friday from 8:00am to 4:00pm; and is closed for lunch from 12:45p-1:45pm   Testing/Procedures: Your physician has requested that you have a cardiac catheterization. Cardiac catheterization is used to diagnose and/or treat various heart conditions. Doctors may recommend this procedure for a number of different reasons. The most common reason is to evaluate chest pain. Chest pain can be a symptom of coronary artery disease (CAD), and cardiac catheterization can show whether plaque is narrowing or blocking your heart's arteries. This procedure is also used to evaluate the valves, as well as measure the blood flow and oxygen levels in different parts of your heart. For further information please visit [email or web address][facility][email or web address]. Please follow instruction sheet, as given.  Follow-Up: Your physician wants you to follow-up in: AFTER CATH PROCEDURE  Thank you for choosing [facility]!!           [place] [address] [place] [address] Dept: [phone] Loc: [place]              03/22/2017  You are scheduled for a cardiac cath on Thursday 04/01/17,  with Dr. Martinique.  1. Please arrive at the [facility] (Main Entrance A) at [facility]: [address] [place], [place] [address] at 10:00 am (two hours before your procedure to ensure your  preparation). Free valet parking service is available.   Special note: Every effort is made to have your procedure done on time. Please understand that emergencies sometimes delay scheduled procedures.  2. Diet: Nothing to Eat or Drink after midnight Wed 03/31/17.  3. Labs: ( bmet,cbc,pt ) to be done 03/25/17  4. Medication instructions in preparation for your procedure:     HOLD METFORMIN day before cath and 2 days after cath.      Night before cath take 1/2 of INSULIN dose.     Morning of cath HOLD INSULIN dose.    On the morning of your procedure, take your ASPIRIN 81 mg and any morning medicines NOT listed above.  You may use sips of water.  5. Plan for one night stay--bring personal belongings. 6. Bring a current list of your medications and current insurance cards. 7. You MUST have a responsible person to drive you home. 8. Someone MUST be with you the first 24 hours after you arrive home or your discharge will be delayed. 9. Please wear clothes that are easy to get on and off and wear slip-on shoes.  Thank you for allowing [place] to care for you!   -- [facility] services      Signed, Almyra Deforest, [place]  03/27/2017 10:21 AM    [place]  [place], [place], [place]  [address] Phone: [phone]; Fax: [phone]

## 2017-03-26 LAB — BASIC METABOLIC PANEL
BUN / CREAT RATIO: 23 (ref 10–24)
BUN: 29 mg/dL — AB (ref 8–27)
CO2: 18 mmol/L — ABNORMAL LOW (ref 20–29)
CREATININE: 1.27 mg/dL (ref 0.76–1.27)
Calcium: 9.5 mg/dL (ref 8.6–10.2)
Chloride: 100 mmol/L (ref 96–106)
GFR calc Af Amer: 68 mL/min/{1.73_m2} (ref >59)
GFR, EST NON AFRICAN AMERICAN: 59 mL/min/{1.73_m2} — AB (ref >59)
GLUCOSE: 298 mg/dL — AB (ref 65–99)
Potassium: 4.9 mmol/L (ref 3.5–5.2)
Sodium: 138 mmol/L (ref 134–144)

## 2017-03-26 LAB — CBC
HEMATOCRIT: 45.8 % (ref 37.5–51.0)
Hemoglobin: 15.6 g/dL (ref 13.0–17.7)
MCH: 30.5 pg (ref 26.6–33.0)
MCHC: 34.1 g/dL (ref 31.5–35.7)
MCV: 90 fL (ref 79–97)
Platelets: 228 10*3/uL (ref 150–379)
RBC: 5.11 x10E6/uL (ref 4.14–5.80)
RDW: 13.6 % (ref 12.3–15.4)
WBC: 8.2 10*3/uL (ref 3.4–10.8)

## 2017-03-26 LAB — PROTIME-INR
INR: 1 (ref 0.8–1.2)
Prothrombin Time: 10.5 s (ref 9.1–12.0)

## 2017-03-27 ENCOUNTER — Encounter: Payer: Self-pay | Admitting: Physician Assistant

## 2017-03-29 ENCOUNTER — Other Ambulatory Visit: Payer: Self-pay | Admitting: Physician Assistant

## 2017-03-31 ENCOUNTER — Telehealth: Payer: Self-pay

## 2017-03-31 NOTE — Telephone Encounter (Signed)
Left detailed message per DPR:  Patient contacted pre-catheterization at [facility] scheduled for:  04/01/2017 @ 1200 Verified arrival time and place:  NT @ 1000 Confirmed AM meds to be taken pre-cath with sip of water: Take ASA Hold metformin-may take this am, then hold glargine-take half dose  Addl concerns:  Left this nurse name and # for call back if any questions

## 2017-04-01 ENCOUNTER — Encounter (HOSPITAL_COMMUNITY)
Admission: RE | Disposition: A | Discharge status: Home / Self Care | Payer: Self-pay | Source: Ambulatory Visit | Attending: Cardiology

## 2017-04-01 ENCOUNTER — Ambulatory Visit (HOSPITAL_COMMUNITY)
Admission: RE | Admit: 2017-04-01 | Discharge: 2017-04-01 | Disposition: A | Discharge status: Home / Self Care | Payer: 59 | Source: Ambulatory Visit | Attending: Cardiology | Admitting: Cardiology

## 2017-04-01 DIAGNOSIS — E119 Type 2 diabetes mellitus without complications: Secondary | ICD-10-CM | POA: Insufficient documentation

## 2017-04-01 DIAGNOSIS — E1169 Type 2 diabetes mellitus with other specified complication: Secondary | ICD-10-CM | POA: Diagnosis present

## 2017-04-01 DIAGNOSIS — Z79899 Other long term (current) drug therapy: Secondary | ICD-10-CM | POA: Diagnosis not present

## 2017-04-01 DIAGNOSIS — I209 Angina pectoris, unspecified: Secondary | ICD-10-CM | POA: Diagnosis not present

## 2017-04-01 DIAGNOSIS — Z87891 Personal history of nicotine dependence: Secondary | ICD-10-CM | POA: Insufficient documentation

## 2017-04-01 DIAGNOSIS — Z7982 Long term (current) use of aspirin: Secondary | ICD-10-CM | POA: Diagnosis not present

## 2017-04-01 DIAGNOSIS — G4733 Obstructive sleep apnea (adult) (pediatric): Secondary | ICD-10-CM | POA: Insufficient documentation

## 2017-04-01 DIAGNOSIS — I10 Essential (primary) hypertension: Secondary | ICD-10-CM | POA: Diagnosis present

## 2017-04-01 DIAGNOSIS — I25119 Atherosclerotic heart disease of native coronary artery with unspecified angina pectoris: Secondary | ICD-10-CM | POA: Insufficient documentation

## 2017-04-01 DIAGNOSIS — Z8614 Personal history of Methicillin resistant Staphylococcus aureus infection: Secondary | ICD-10-CM | POA: Diagnosis not present

## 2017-04-01 DIAGNOSIS — Z7983 Long term (current) use of bisphosphonates: Secondary | ICD-10-CM | POA: Diagnosis not present

## 2017-04-01 DIAGNOSIS — Z794 Long term (current) use of insulin: Secondary | ICD-10-CM | POA: Diagnosis not present

## 2017-04-01 DIAGNOSIS — E1165 Type 2 diabetes mellitus with hyperglycemia: Secondary | ICD-10-CM

## 2017-04-01 DIAGNOSIS — E1142 Type 2 diabetes mellitus with diabetic polyneuropathy: Secondary | ICD-10-CM | POA: Diagnosis present

## 2017-04-01 DIAGNOSIS — E785 Hyperlipidemia, unspecified: Secondary | ICD-10-CM | POA: Diagnosis not present

## 2017-04-01 DIAGNOSIS — E1122 Type 2 diabetes mellitus with diabetic chronic kidney disease: Secondary | ICD-10-CM | POA: Diagnosis present

## 2017-04-01 DIAGNOSIS — I2510 Atherosclerotic heart disease of native coronary artery without angina pectoris: Secondary | ICD-10-CM | POA: Diagnosis present

## 2017-04-01 DIAGNOSIS — E1140 Type 2 diabetes mellitus with diabetic neuropathy, unspecified: Secondary | ICD-10-CM | POA: Diagnosis present

## 2017-04-01 HISTORY — PX: LEFT HEART CATH AND CORONARY ANGIOGRAPHY: CATH118249

## 2017-04-01 LAB — GLUCOSE, CAPILLARY
Glucose-Capillary: 301 mg/dL — ABNORMAL HIGH (ref 65–99)
Glucose-Capillary: 241 mg/dL — ABNORMAL HIGH (ref 65–99)

## 2017-04-01 SURGERY — LEFT HEART CATH AND CORONARY ANGIOGRAPHY
Anesthesia: LOCAL

## 2017-04-01 MED ORDER — IOPAMIDOL (ISOVUE-370) INJECTION 76%
INTRAVENOUS | Status: AC
  Filled 2017-04-01: qty 100

## 2017-04-01 MED ORDER — SODIUM CHLORIDE 0.9% FLUSH: 3.0000 mL | Freq: Two times a day (BID) | INTRAVENOUS | Status: DC

## 2017-04-01 MED ORDER — HEPARIN (PORCINE) IN NACL 2-0.9 UNIT/ML-% IJ SOLN
INTRAMUSCULAR | Status: AC | PRN
  Administered 2017-04-01: 1000 mL

## 2017-04-01 MED ORDER — SODIUM CHLORIDE 0.9% FLUSH: 3.0000 mL | INTRAVENOUS | Status: DC | PRN

## 2017-04-01 MED ORDER — FENTANYL CITRATE (PF) 100 MCG/2ML IJ SOLN
INTRAMUSCULAR | Status: AC
  Filled 2017-04-01: qty 2

## 2017-04-01 MED ORDER — INSULIN ASPART 100 UNIT/ML ~~LOC~~ SOLN
5.0000 [IU] | Freq: Once | SUBCUTANEOUS | Status: AC
  Administered 2017-04-01: 5 [IU] via SUBCUTANEOUS

## 2017-04-01 MED ORDER — INSULIN ASPART 100 UNIT/ML ~~LOC~~ SOLN
SUBCUTANEOUS | Status: AC
  Administered 2017-04-01: 5 [IU] via SUBCUTANEOUS
  Filled 2017-04-01: qty 1

## 2017-04-01 MED ORDER — SODIUM CHLORIDE 0.9 % IV SOLN: 250.0000 mL | INTRAVENOUS | Status: DC | PRN

## 2017-04-01 MED ORDER — FENTANYL CITRATE (PF) 100 MCG/2ML IJ SOLN
INTRAMUSCULAR | Status: DC | PRN
  Administered 2017-04-01: 25 ug via INTRAVENOUS

## 2017-04-01 MED ORDER — HEPARIN SODIUM (PORCINE) 1000 UNIT/ML IJ SOLN
INTRAMUSCULAR | Status: DC | PRN
  Administered 2017-04-01: 5000 [IU] via INTRAVENOUS

## 2017-04-01 MED ORDER — HEPARIN SODIUM (PORCINE) 1000 UNIT/ML IJ SOLN
INTRAMUSCULAR | Status: AC
  Filled 2017-04-01: qty 1

## 2017-04-01 MED ORDER — MIDAZOLAM HCL 2 MG/2ML IJ SOLN
INTRAMUSCULAR | Status: AC
  Filled 2017-04-01: qty 2

## 2017-04-01 MED ORDER — HEPARIN (PORCINE) IN NACL 2-0.9 UNIT/ML-% IJ SOLN
INTRAMUSCULAR | Status: AC
  Filled 2017-04-01: qty 1000

## 2017-04-01 MED ORDER — LIDOCAINE HCL (PF) 1 % IJ SOLN
INTRAMUSCULAR | Status: AC
  Filled 2017-04-01: qty 30

## 2017-04-01 MED ORDER — ASPIRIN 81 MG PO CHEW: 81.0000 mg | CHEWABLE_TABLET | ORAL | Status: DC

## 2017-04-01 MED ORDER — VERAPAMIL HCL 2.5 MG/ML IV SOLN
INTRAVENOUS | Status: AC
  Filled 2017-04-01: qty 2

## 2017-04-01 MED ORDER — VERAPAMIL HCL 2.5 MG/ML IV SOLN
INTRAVENOUS | Status: DC | PRN
  Administered 2017-04-01: 10 mL via INTRA_ARTERIAL

## 2017-04-01 MED ORDER — METFORMIN HCL 1000 MG PO TABS: 1000.0000 mg | ORAL_TABLET | Freq: Two times a day (BID) | ORAL | 3 refills | Status: DC

## 2017-04-01 MED ORDER — MIDAZOLAM HCL 2 MG/2ML IJ SOLN
INTRAMUSCULAR | Status: DC | PRN
  Administered 2017-04-01: 2 mg via INTRAVENOUS

## 2017-04-01 MED ORDER — LIDOCAINE HCL (PF) 1 % IJ SOLN
INTRAMUSCULAR | Status: DC | PRN
  Administered 2017-04-01: 2 mL via SUBCUTANEOUS

## 2017-04-01 MED ORDER — SODIUM CHLORIDE 0.9 % WEIGHT BASED INFUSION: 1.0000 mL/kg/h | INTRAVENOUS | Status: DC

## 2017-04-01 MED ORDER — SODIUM CHLORIDE 0.9 % WEIGHT BASED INFUSION
3.0000 mL/kg/h | INTRAVENOUS | Status: AC
  Administered 2017-04-01: 3 mL/kg/h via INTRAVENOUS

## 2017-04-01 MED ORDER — SODIUM CHLORIDE 0.9 % WEIGHT BASED INFUSION
1.0000 mL/kg/h | INTRAVENOUS | Status: DC
  Administered 2017-04-01: 1 mL/kg/h via INTRAVENOUS

## 2017-04-01 SURGICAL SUPPLY — 10 items
CATH 5FR JL3.5 JR4 ANG PIG MP (CATHETERS) ×1 IMPLANT
DEVICE RAD COMP TR BAND LRG (VASCULAR PRODUCTS) ×1 IMPLANT
GLIDESHEATH SLEND SS 6F .021 (SHEATH) ×1 IMPLANT
GUIDEWIRE INQWIRE 1.5J.035X260 (WIRE) IMPLANT
INQWIRE 1.5J .035X260CM (WIRE) ×2
KIT HEART LEFT (KITS) ×2 IMPLANT
PACK CARDIAC CATHETERIZATION (CUSTOM PROCEDURE TRAY) ×2 IMPLANT
SYR MEDRAD MARK V 150ML (SYRINGE) ×2 IMPLANT
TRANSDUCER W/STOPCOCK (MISCELLANEOUS) ×2 IMPLANT
TUBING CIL FLEX 10 FLL-RA (TUBING) ×2 IMPLANT

## 2017-04-01 NOTE — Progress Notes (Signed)
Gwynne Edinger made aware of pt's CBG of 301

## 2017-04-01 NOTE — Discharge Instructions (Signed)

## 2017-04-01 NOTE — Interval H&P Note (Signed)
History and Physical Interval Note:  04/01/2017 1:45 PM  Alan Morrison  has presented today for surgery, with the diagnosis of chest pain  The various methods of treatment have been discussed with the patient and family. After consideration of risks, benefits and other options for treatment, the patient has consented to  Procedure(s): LEFT HEART CATH AND CORONARY ANGIOGRAPHY (N/A) as a surgical intervention .  The patient's history has been reviewed, patient examined, no change in status, stable for surgery.  I have reviewed the patient's chart and labs.  Questions were answered to the patient's satisfaction.   Cath Lab Visit (complete for each Cath Lab visit)  Clinical Evaluation Leading to the Procedure:   ACS: No.  Non-ACS:    Anginal Classification: CCS III  Anti-ischemic medical therapy: Minimal Therapy (1 class of medications)  Non-Invasive Test Results: High-risk stress test findings: cardiac mortality >3%/year  Prior CABG: No previous CABG        Collier Salina [facility] 04/01/2017 1:46 PM

## 2017-04-01 NOTE — Research (Signed)
OPTIMIZE Informed Consent   Subject Name: Terrace Fontanilla  Subject met inclusion and exclusion criteria.  The informed consent form, study requirements and expectations were reviewed with the subject and questions and concerns were addressed prior to the signing of the consent form.  The subject verbalized understanding of the trail requirements.  The subject agreed to participate in the OPTIMIZE trial and signed the informed consent.  The informed consent was obtained prior to performance of any protocol-specific procedures for the subject.  A copy of the signed informed consent was given to the subject and a copy was placed in the subject's medical record. Applicable only if RANDOMIZED.  Hedrick,Tammy W 04/01/2017, 1130

## 2017-04-02 ENCOUNTER — Encounter (HOSPITAL_COMMUNITY): Payer: Self-pay | Admitting: Cardiology

## 2017-04-02 ENCOUNTER — Encounter: Payer: Self-pay | Admitting: Cardiology

## 2017-04-06 ENCOUNTER — Encounter: Payer: Self-pay | Admitting: Cardiology

## 2017-04-12 DIAGNOSIS — R0789 Other chest pain: Secondary | ICD-10-CM | POA: Insufficient documentation

## 2017-04-12 DIAGNOSIS — M47812 Spondylosis without myelopathy or radiculopathy, cervical region: Secondary | ICD-10-CM | POA: Insufficient documentation

## 2017-04-12 DIAGNOSIS — D508 Other iron deficiency anemias: Secondary | ICD-10-CM | POA: Insufficient documentation

## 2017-04-12 DIAGNOSIS — R1013 Epigastric pain: Secondary | ICD-10-CM | POA: Insufficient documentation

## 2017-04-12 DIAGNOSIS — K219 Gastro-esophageal reflux disease without esophagitis: Secondary | ICD-10-CM | POA: Insufficient documentation

## 2017-04-14 ENCOUNTER — Telehealth: Payer: Self-pay | Admitting: Family Medicine

## 2017-04-14 ENCOUNTER — Telehealth: Payer: Self-pay | Admitting: Physician Assistant

## 2017-04-14 NOTE — Telephone Encounter (Signed)
04/14/2017 Received [employer] Insurance Company Disability Claim Form from [employer] on patient for [place], [place] form was give to [place] he signed it and I put in inter-office bag to go back to [place] on 04/14/2017.  cbr

## 2017-04-14 NOTE — Telephone Encounter (Signed)
CRM for notification. See Telephone encounter for:   04/14/17.  Caller name: Relation to pt: self Call back number: [phone] Pharmacy: [id number] - [place], [place] [phone] (Phone) [phone] (Fax)    Reason for call:  Patient inquiring about shingle vaccination, patient contacted several local pharmacies and there out of stock as well as the office , patient would like Rx sent to pharmacy and would like to be notified if and when vaccination will be in office, please advise

## 2017-04-15 ENCOUNTER — Encounter: Payer: Self-pay | Admitting: Physician Assistant

## 2017-04-15 ENCOUNTER — Ambulatory Visit: Payer: 59 | Admitting: Physician Assistant

## 2017-04-15 VITALS — BP 130/76 | HR 100 | Ht 68.0 in | Wt 209.8 lb

## 2017-04-15 DIAGNOSIS — E785 Hyperlipidemia, unspecified: Secondary | ICD-10-CM

## 2017-04-15 DIAGNOSIS — I2510 Atherosclerotic heart disease of native coronary artery without angina pectoris: Secondary | ICD-10-CM

## 2017-04-15 DIAGNOSIS — Z794 Long term (current) use of insulin: Secondary | ICD-10-CM | POA: Diagnosis not present

## 2017-04-15 DIAGNOSIS — G4733 Obstructive sleep apnea (adult) (pediatric): Secondary | ICD-10-CM

## 2017-04-15 DIAGNOSIS — Z9989 Dependence on other enabling machines and devices: Secondary | ICD-10-CM

## 2017-04-15 DIAGNOSIS — E119 Type 2 diabetes mellitus without complications: Secondary | ICD-10-CM

## 2017-04-15 MED ORDER — ROSUVASTATIN CALCIUM 40 MG PO TABS: 40.0000 mg | ORAL_TABLET | Freq: Every day | ORAL | 3 refills | Status: DC

## 2017-04-15 NOTE — Progress Notes (Signed)
Cardiology Office Note    Date:  04/15/2017   ID:  Alan Morrison, DOB [birth year], MRN [id number]  PCP:  [name], MD  Cardiologist:  Dr. Martinique  Chief Complaint  Patient presents with  . Follow-up    seen for Dr. Martinique.     History of Present Illness:  Alan Morrison is a 66 y.o. male with PMH of DM II, HLD and OSA.  He had a history of nonobstructive CAD by cardiac cath in 2003.  Myoview in 2000 and was normal.  He also had a normal echocardiogram in 2012 as well.  He has been experiencing some chest discomfort for the past 6 months.  He was started on 50 mg Toprol-XL during the last office visit.  Coronary CT showed significant blockage in the mid RCA, mid LAD and the distal LAD.  I last saw the patient on 03/25/2017, we set the patient up for cardiac catheterization.  He eventually underwent cardiac catheterization on 04/01/2017 which showed mild nonobstructive disease with 50% mid LAD, 30% proximal LAD, 25% mid RCA lesion.  Otherwise no complication was identified.  Medical therapy was recommended.  Patient presents today for cardiology office visit.  He denies any further chest pain or shortness of breath.  I have discontinued his Lipitor and switched him to 40 mg Crestor.  His last LDL was 99.  He will need a fasting lipid panel and LFTs in 2 months.  Otherwise he has no lower extremity edema, orthopnea or PND.   Past Medical History:  Diagnosis Date  . Anemia, iron deficiency, inadequate dietary intake   . 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   by [place]  . CAD (coronary artery disease) 2003   cath - Min Dz, EF 65%, Adm.R/O'd  . Cervical spondylosis without myelopathy   . Chest pain, atypical   . Choroidal nevus 01/22/2011   left, yearly eye exam, no diabetic retinopathy  . Diabetes mellitus type II   . Dyspepsia   . Ex-smoker   . Fatty liver 02/2016   by [place]  . GERD (gastroesophageal reflux disease)   . Headache(784.0)   . Heart murmur    Hx of  . History of MRI  of brain and brain stem 09/06  . History of MRSA infection 12/2013   R knee boil  . HLD (hyperlipidemia)   . Hyperuricemia   . Obesity   . Sleep apnea   . Stress-induced cardiomyopathy 09/27/98   WNL, EF 64%  . Urosepsis 01/01-01/05/10   Hospitalization    Past Surgical History:  Procedure Laterality Date  . COLONOSCOPY  07/2013   1 benign polyp rpt 10 yrs [name])  . KNEE ARTHROSCOPY Left 1988  . KNEE SURGERY Right 05/21/03   Right, Dr. Marlou Sa, med meniscus tear via MRI  . LEFT HEART CATH AND CORONARY ANGIOGRAPHY N/A 04/01/2017   Procedure: LEFT HEART CATH AND CORONARY ANGIOGRAPHY;  Surgeon: Martinique, Peter M, MD;  Location: [facility];  Service: Cardiovascular;  Laterality: N/A;  . MYELOGRAM  08/05   bulging disc    Current Medications: Outpatient Medications Prior to Visit  Medication Sig Dispense Refill  . aspirin EC 81 MG tablet Take 81 mg by mouth at bedtime.    . Cholecalciferol (VITAMIN D) 2000 units tablet Take 2,000 Units by mouth daily.    . finasteride (PROSCAR) 5 MG tablet Take 1 tablet (5 mg total) by mouth daily. 90 tablet 3  . glucose blood (ONE TOUCH ULTRA TEST) test strip  1 each by Other route See admin instructions. Check blood sugar 3 times weekly.    [name] ibuprofen (ADVIL,MOTRIN) 200 MG tablet Take 600 mg by mouth every 8 (eight) hours as needed (for pain.).    . Insulin Glargine (BASAGLAR KWIKPEN) 100 UNIT/ML SOPN Inject 0.6 mLs (60 Units total) into the skin every morning. And pen needles 1/day (Patient taking differently: Inject 60 Units into the skin daily. And pen needles 1/day) 10 pen 11  . insulin lispro (HUMALOG KWIKPEN) 100 UNIT/ML KiwkPen 3 times a day (just before each meal), 14-6-14 units, and pen needles 4/day (Patient taking differently: Inject 6-14 Units into the skin 3 (three) times daily before meals. 14 units in the morning, 6 units at lunch, & 14 at night) 15 mL 11  . Insulin Pen Needle (PEN NEEDLES 31GX5/16") 31G X 8 MM MISC Use pen needles  to inject lantus 10u into skin daily. 100 each 1  . metFORMIN (GLUCOPHAGE) 1000 MG tablet Take 1 tablet (1,000 mg total) by mouth 2 (two) times daily. 180 tablet 3  . metoprolol tartrate (LOPRESSOR) 25 MG tablet Take 0.5 tablets (12.5 mg total) by mouth 2 (two) times daily. 60 tablet 3  . Multiple Vitamin (MULTIVITAMIN WITH MINERALS) TABS tablet Take 1 tablet by mouth daily.    . probenecid (BENEMID) 500 MG tablet Take 0.5 tablets (250 mg total) by mouth daily. 45 tablet 1  . ramipril (ALTACE) 5 MG capsule Take 1 capsule (5 mg total) by mouth daily. (Patient taking differently: Take 5 mg by mouth at bedtime. ) 90 capsule 3  . tamsulosin (FLOMAX) 0.4 MG CAPS capsule Take 1 capsule (0.4 mg total) by mouth daily. 90 capsule 3  . vitamin C (ASCORBIC ACID) 500 MG tablet Take 500 mg by mouth every evening.     [name] atorvastatin (LIPITOR) 40 MG tablet Take 1 tablet (40 mg total) by mouth daily. (Patient taking differently: Take 40 mg by mouth at bedtime. ) 30 tablet 5   No facility-administered medications prior to visit.      Allergies:   Victoza [liraglutide]   Social History   Socioeconomic History  . Marital status: Married    Spouse name: None  . Number of children: 2  . Years of education: None  . Highest education level: None  Social Needs  . Financial resource strain: None  . Food insecurity - worry: None  . Food insecurity - inability: None  . Transportation needs - medical: None  . Transportation needs - non-medical: None  Occupational History  . Occupation: Maintenance    Employer: [employer]  Tobacco Use  . Smoking status: Former Smoker    Last attempt to quit: 04/20/1974    Years since quitting: 43.0  . Smokeless tobacco: Never Used  . Tobacco comment: quit over 20 years  Substance and Sexual Activity  . Alcohol use: Yes    Alcohol/week: 1.8 oz    Types: 3 Cans of beer per week    Comment: occasional  . Drug use: No  . Sexual activity: None  Other Topics  Concern  . None  Social History Narrative   Lives with wife   Occupation: equipment maintenance   Activity: no regular exercise - hunts   Diet: good water, fruits/vegetables     Family History:  The patient's family history includes CAD (age of onset: 25) in his brother; Cancer in his maternal uncle; Diabetes in his mother; Heart disease in his father and mother; Hypertension in  his father; Migraines in his brother.   ROS:   Please see the history of present illness.    ROS All other systems reviewed and are negative.   PHYSICAL EXAM:   VS:  BP 130/76   Pulse 100   Ht 5\' 8"  (1.727 m)   Wt 209 lb 12.8 oz (95.2 kg)   BMI 31.90 kg/m    GEN: Well nourished, well developed, in no acute distress  HEENT: normal  Neck: no JVD, carotid bruits, or masses Cardiac: RRR; no murmurs, rubs, or gallops,no edema  Respiratory:  clear to auscultation bilaterally, normal work of breathing GI: soft, nontender, nondistended, + BS MS: no deformity or atrophy  Skin: warm and dry, no rash Neuro:  Alert and Oriented x 3, Strength and sensation are intact Psych: euthymic mood, full affect  Wt Readings from Last 3 Encounters:  04/15/17 209 lb 12.8 oz (95.2 kg)  04/01/17 210 lb (95.3 kg)  03/25/17 213 lb (96.6 kg)      Studies/Labs Reviewed:   EKG:  EKG is not ordered today.   Recent Labs: 03/25/2017: BUN 29; Creatinine, Ser 1.27; Hemoglobin 15.6; Platelets 228; Potassium 4.9; Sodium 138   Lipid Panel    Component Value Date/Time   CHOL 152 10/16/2016 0840   TRIG 72.0 10/16/2016 0840   HDL 38.80 (L) 10/16/2016 0840   CHOLHDL 4 10/16/2016 0840   VLDL 14.4 10/16/2016 0840   LDLCALC 99 10/16/2016 0840    Additional studies/ records that were reviewed today include:   Coronary CT 03/19/2017 IMPRESSION: 1. Coronary artery calcium score 790 Agatston units, placing the patient in the 89th percentile for age and gender. This suggests high risk for future cardiac events.  2. Extensive  plaque in the proximal to mid LAD, possible area of moderate stenosis in the mid LAD.  3.  Moderate OM1 with moderate stenosis proximally.  4.  Mild to possibly moderate mid RCA stenosis.  See CT report from 11/28. This is the FFR result from the 11/28 study.  FFR 0.82 in mid RCA, suggesting borderline significant stenosis. FFR 0.76 in the PDA, suggesting hemodynamically significant disease involving the PDA.     Cath 04/01/2017 Conclusion     Mid LAD to Dist LAD lesion is 45% stenosed.  Mid LAD lesion is 50% stenosed.  Prox LAD lesion is 35% stenosed.  Mid RCA lesion is 25% stenosed.  Ost RPDA lesion is 25% stenosed.  The left ventricular systolic function is normal.  LV end diastolic pressure is normal.  The left ventricular ejection fraction is 55-65% by visual estimate.   1. Nonobstructive CAD 2. Normal LV function.  3. Normal LVEDP  Plan: although CT FFR was abnormal there appears to be nonobstructive disease by angiography. I would recommend continued medical therapy and risk factor modification       ASSESSMENT:    1. Coronary artery disease involving native coronary artery of native heart without angina pectoris   2. Hyperlipidemia, unspecified hyperlipidemia type   3. Controlled type 2 diabetes mellitus without complication, with long-term current use of insulin ([place])   4. OSA on CPAP      PLAN:  In order of problems listed above:  1. CAD: Mild disease on cardiac catheterization 2003, recently underwent cardiac catheterization due to abnormal coronary CT, head position revealed a mild progression of disease compared to the previous cath, however no critical disease noted.  He denies any further chest pain.  We will need to control risk factor aggressively.  Continue aspirin, metoprolol, Lipitor  2. Hyperlipidemia: Switch 40 mg daily of Lipitor to 40 mg daily of Crestor.  Obtain fasting lipid panel and LFT in 2 months.  3. DM 2: Hemoglobin  A1c has been improving since June 2018, last hemoglobin A1c was 8.8.  Will need to continue to follow-up with primary care provider for aggressive management.  4. Obstructive sleep apnea on CPAP: Compliant with CPAP machine.    Medication Adjustments/Labs and Tests Ordered: Current medicines are reviewed at length with the patient today.  Concerns regarding medicines are outlined above.  Medication changes, Labs and Tests ordered today are listed in the Patient Instructions below. Patient Instructions  Medication Instructions:  STOP atorvastatin  START rosuvastatin (Crestor) 40 mg daily  Labwork: Please return for FASTING labs in 2 months (Lipid, LFT)  Follow-Up: Your physician recommends that you schedule a follow-up appointment in: 3-4 months with Dr. Martinique   Any Other Special Instructions Will Be Listed Below (If Applicable).     If you need a refill on your cardiac medications before your next appointment, please call your pharmacy.      Hilbert Corrigan, [place]  04/15/2017 1:23 PM    [facility] [address], [place], [place]  [address] Phone: [phone]; Fax: [phone]

## 2017-04-15 NOTE — Patient Instructions (Signed)
Medication Instructions:  STOP atorvastatin  START rosuvastatin (Crestor) 40 mg daily  Labwork: Please return for FASTING labs in 2 months (Lipid, LFT)  Follow-Up: Your physician recommends that you schedule a follow-up appointment in: 3-4 months with Dr. Martinique   Any Other Special Instructions Will Be Listed Below (If Applicable).     If you need a refill on your cardiac medications before your next appointment, please call your pharmacy.

## 2017-05-15 ENCOUNTER — Other Ambulatory Visit: Payer: Self-pay | Admitting: Family Medicine

## 2017-06-21 LAB — LIPID PANEL
CHOL/HDL RATIO: 3.5 ratio (ref 0.0–5.0)
Cholesterol, Total: 108 mg/dL (ref 100–199)
HDL: 31 mg/dL — ABNORMAL LOW (ref >39)
LDL Calculated: 57 mg/dL (ref 0–99)
TRIGLYCERIDES: 98 mg/dL (ref 0–149)
VLDL CHOLESTEROL CAL: 20 mg/dL (ref 5–40)

## 2017-06-21 LAB — HEPATIC FUNCTION PANEL
ALT: 44 IU/L (ref 0–44)
AST: 37 IU/L (ref 0–40)
Albumin: 4.4 g/dL (ref 3.6–4.8)
Alkaline Phosphatase: 62 IU/L (ref 39–117)
Bilirubin Total: 0.5 mg/dL (ref 0.0–1.2)
Bilirubin, Direct: 0.18 mg/dL (ref 0.00–0.40)
TOTAL PROTEIN: 7.1 g/dL (ref 6.0–8.5)

## 2017-06-22 NOTE — Progress Notes (Signed)
Good cholesterol remain low, but bad cholesterol has significantly improved. Good liver function

## 2017-06-29 ENCOUNTER — Encounter: Payer: Self-pay | Admitting: Family Medicine

## 2017-06-29 ENCOUNTER — Ambulatory Visit (INDEPENDENT_AMBULATORY_CARE_PROVIDER_SITE_OTHER): Payer: 59 | Admitting: Family Medicine

## 2017-06-29 VITALS — BP 122/68 | HR 93 | Temp 97.9°F | Wt 203.0 lb

## 2017-06-29 DIAGNOSIS — I10 Essential (primary) hypertension: Secondary | ICD-10-CM

## 2017-06-29 DIAGNOSIS — R05 Cough: Secondary | ICD-10-CM | POA: Diagnosis not present

## 2017-06-29 DIAGNOSIS — R059 Cough, unspecified: Secondary | ICD-10-CM

## 2017-06-29 MED ORDER — LOSARTAN POTASSIUM 25 MG PO TABS: 25.0000 mg | ORAL_TABLET | Freq: Every day | ORAL | 1 refills | Status: DC

## 2017-06-29 MED ORDER — BENZONATATE 100 MG PO CAPS: 100.0000 mg | ORAL_CAPSULE | Freq: Three times a day (TID) | ORAL | 0 refills | Status: DC | PRN

## 2017-06-29 NOTE — Progress Notes (Signed)
BP 122/68 (BP Location: Left Arm, Patient Position: Sitting, Cuff Size: Normal)   Pulse 93   Temp 97.9 F (36.6 C) (Oral)   Wt 203 lb (92.1 kg)   SpO2 94%   BMI 30.87 kg/m    CC: cough Subjective:    Patient ID: Alan Morrison, male    DOB: [birth year], 67 y.o.   MRN: [id number]  HPI: Alan Morrison is a 67 y.o. male presenting on 06/29/2017 for Cough (Dry cough for 2-3 mos, worsening. Gets worse later in the day. Causes abd pain.)   2-3 mo h/o dry non productive nagging cough, worse as day progresses. Feels like tickle in the throat. Cough causes abdominal soreness. L nostril congested in the mornings. No preceding viral illness.   No fevers/chills, URI sxs, head congestion, runny nose or sneezing or watery eyes, significant GERD sxs. No longer needing tums. No night sweats, weight loss.  On ramipril 5mg  daily for years.  No sick contacts, non smoker.  Weight loss - watching diet, smaller portions.  Relevant past medical, surgical, family and social history reviewed and updated as indicated. Interim medical history since our last visit reviewed. Allergies and medications reviewed and updated. Outpatient Medications Prior to Visit  Medication Sig Dispense Refill  . aspirin EC 81 MG tablet Take 81 mg by mouth at bedtime.    . Cholecalciferol (VITAMIN D) 2000 units tablet Take 2,000 Units by mouth daily.    . finasteride (PROSCAR) 5 MG tablet Take 1 tablet (5 mg total) by mouth daily. 90 tablet 3  . glucose blood (ONE TOUCH ULTRA TEST) test strip 1 each by Other route See admin instructions. Check blood sugar 3 times weekly.    [name] ibuprofen (ADVIL,MOTRIN) 200 MG tablet Take 600 mg by mouth every 8 (eight) hours as needed (for pain.).    . Insulin Glargine (BASAGLAR KWIKPEN) 100 UNIT/ML SOPN Inject 0.6 mLs (60 Units total) into the skin every morning. And pen needles 1/day (Patient taking differently: Inject 60 Units into the skin daily. And pen needles 1/day) 10 pen 11  . insulin lispro  (HUMALOG KWIKPEN) 100 UNIT/ML KiwkPen 3 times a day (just before each meal), 14-6-14 units, and pen needles 4/day (Patient taking differently: Inject 6-14 Units into the skin 3 (three) times daily before meals. 14 units in the morning, 6 units at lunch, & 14 at night) 15 mL 11  . Insulin Pen Needle (PEN NEEDLES 31GX5/16") 31G X 8 MM MISC Use pen needles to inject lantus 10u into skin daily. 100 each 1  . metFORMIN (GLUCOPHAGE) 1000 MG tablet Take 1 tablet (1,000 mg total) by mouth 2 (two) times daily. 180 tablet 3  . Multiple Vitamin (MULTIVITAMIN WITH MINERALS) TABS tablet Take 1 tablet by mouth daily.    . probenecid (BENEMID) 500 MG tablet TAKE ONE-HALF TABLET BY  MOUTH DAILY 45 tablet 1  . rosuvastatin (CRESTOR) 40 MG tablet Take 1 tablet (40 mg total) by mouth daily. 90 tablet 3  . tamsulosin (FLOMAX) 0.4 MG CAPS capsule Take 1 capsule (0.4 mg total) by mouth daily. 90 capsule 3  . vitamin C (ASCORBIC ACID) 500 MG tablet Take 500 mg by mouth every evening.     . ramipril (ALTACE) 5 MG capsule Take 1 capsule (5 mg total) by mouth daily. (Patient taking differently: Take 5 mg by mouth at bedtime. ) 90 capsule 3  . metoprolol tartrate (LOPRESSOR) 25 MG tablet Take 0.5 tablets (12.5 mg total) by mouth 2 (two) times daily.  60 tablet 3   No facility-administered medications prior to visit.      Per HPI unless specifically indicated in ROS section below Review of Systems     Objective:    BP 122/68 (BP Location: Left Arm, Patient Position: Sitting, Cuff Size: Normal)   Pulse 93   Temp 97.9 F (36.6 C) (Oral)   Wt 203 lb (92.1 kg)   SpO2 94%   BMI 30.87 kg/m   Wt Readings from Last 3 Encounters:  06/29/17 203 lb (92.1 kg)  04/15/17 209 lb 12.8 oz (95.2 kg)  04/01/17 210 lb (95.3 kg)    Physical Exam  Constitutional: He appears well-developed and well-nourished. No distress.  HENT:  Head: Normocephalic and atraumatic.  Right Ear: Hearing, tympanic membrane, external ear and ear canal  normal.  Left Ear: Hearing, tympanic membrane, external ear and ear canal normal.  Nose: No mucosal edema or rhinorrhea. Right sinus exhibits no maxillary sinus tenderness and no frontal sinus tenderness. Left sinus exhibits no maxillary sinus tenderness and no frontal sinus tenderness.  Mouth/Throat: Uvula is midline, oropharynx is clear and moist and mucous membranes are normal. No oropharyngeal exudate, posterior oropharyngeal edema, posterior oropharyngeal erythema or tonsillar abscesses.  Rightward nasal septal deviation  Eyes: Conjunctivae and EOM are normal. Pupils are equal, round, and reactive to light. No scleral icterus.  Neck: Normal range of motion. Neck supple.  Cardiovascular: Normal rate, regular rhythm, normal heart sounds and intact distal pulses.  No murmur heard. Pulmonary/Chest: Effort normal and breath sounds normal. No respiratory distress. He has no wheezes. He has no rales.  Lymphadenopathy:    He has no cervical adenopathy.  Skin: Skin is warm and dry. No rash noted.  Nursing note and vitals reviewed.  Results for orders placed or performed in visit on 04/15/17  Lipid panel  Result Value Ref Range   Cholesterol, Total 108 100 - 199 mg/dL   Triglycerides 98 0 - 149 mg/dL   HDL 31 (L) >39 mg/dL   VLDL Cholesterol Cal 20 5 - 40 mg/dL   LDL Calculated 57 0 - 99 mg/dL   Chol/HDL Ratio 3.5 0.0 - 5.0 ratio  Hepatic function panel  Result Value Ref Range   Total Protein 7.1 6.0 - 8.5 g/dL   Albumin 4.4 3.6 - 4.8 g/dL   Bilirubin Total 0.5 0.0 - 1.2 mg/dL   Bilirubin, Direct 0.18 0.00 - 0.40 mg/dL   Alkaline Phosphatase 62 39 - 117 IU/L   AST 37 0 - 40 IU/L   ALT 44 0 - 44 IU/L      Assessment & Plan:   Problem List Items Addressed This Visit    Cough - Primary    Anticipate ACEI related cough. Discussed with patient. Will stop ramipril, start losartan 25mg  daily. Advised to ensure lot not affected by recent recall. He will update [place] and if tolerated well will  do 3 mo supply. Tessalon perls Rx today.       Essential hypertension, benign    Well controlled on current regimen. Will monitor closely with recent change.       Relevant Medications   losartan (COZAAR) 25 MG tablet       Meds ordered this encounter  Medications  . losartan (COZAAR) 25 MG tablet    Sig: Take 1 tablet (25 mg total) by mouth daily.    Dispense:  30 tablet    Refill:  1    In place of ramipril  . benzonatate (TESSALON)  100 MG capsule    Sig: Take 1 capsule (100 mg total) by mouth 3 (three) times daily as needed for cough.    Dispense:  30 capsule    Refill:  0   No orders of the defined types were placed in this encounter.   Follow up plan: Return if symptoms worsen or fail to improve.  [name], MD

## 2017-06-29 NOTE — Assessment & Plan Note (Addendum)
Anticipate ACEI related cough. Discussed with patient. Will stop ramipril, start losartan 25mg  daily. Advised to ensure lot not affected by recent recall. He will update [place] and if tolerated well will do 3 mo supply. Tessalon perls Rx today.

## 2017-06-29 NOTE — Assessment & Plan Note (Signed)
Well controlled on current regimen. Will monitor closely with recent change.

## 2017-06-29 NOTE — Patient Instructions (Signed)
I think ramipril may be causing dry cough side effect. Stop this medicine Trial losartan 25mg  daily daily in its place.  Keep an eye on blood pressures and cough, let me know if tolerated well and I send in 3 month supply next refill

## 2017-07-07 ENCOUNTER — Encounter: Payer: Self-pay | Admitting: Cardiology

## 2017-07-08 ENCOUNTER — Encounter: Payer: Self-pay | Admitting: Family Medicine

## 2017-07-09 MED ORDER — LOSARTAN POTASSIUM 25 MG PO TABS: 25.0000 mg | ORAL_TABLET | Freq: Every day | ORAL | 3 refills | Status: DC

## 2017-07-14 NOTE — Progress Notes (Signed)
Cardiology Office Note    Date:  07/15/2017   ID:  Alan Morrison, DOB 12-13-1950, MRN [id number]  PCP:  [name], MD  Cardiologist:  Dr. Martinique  Chief Complaint  Patient presents with  . Follow-up    History of Present Illness:  Alan Morrison is a 67 y.o. male with PMH of DM II, HLD and OSA.  He had a history of nonobstructive CAD by cardiac cath in 2003.  Myoview in 2000 and was normal.  He also had a normal echocardiogram in 2012 as well.  He has been experiencing some chest discomfort for the past 6 months.  He was started on 50 mg Toprol-XL.  Coronary CT showed significant blockage in the mid RCA, mid LAD and the distal LAD.   He underwent cardiac catheterization on 04/01/2017 which showed mild nonobstructive disease with 50% mid LAD, 30% proximal LAD, 25% mid RCA lesion.  Otherwise no complication was identified.  Medical therapy was recommended.  On follow up today he is doing well. No chest pain or dyspnea.   LDL cholesterol has improved significantly with high dose Crestor.   BP is well controlled.  Otherwise he has no lower extremity edema, orthopnea or PND.   Past Medical History:  Diagnosis Date  . Anemia, iron deficiency, inadequate dietary intake   . 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   by [place]  . CAD (coronary artery disease) 2003   cath - Min Dz, EF 65%, Adm.R/O'd  . Cervical spondylosis without myelopathy   . Chest pain, atypical   . Choroidal nevus 01/22/2011   left, yearly eye exam, no diabetic retinopathy  . Diabetes mellitus type II   . Dyspepsia   . Ex-smoker   . Fatty liver 02/2016   by [place]  . GERD (gastroesophageal reflux disease)   . Headache(784.0)   . Heart murmur    Hx of  . History of MRI of brain and brain stem 09/06  . History of MRSA infection 12/2013   R knee boil  . HLD (hyperlipidemia)   . Hyperuricemia   . Obesity   . Sleep apnea   . Stress-induced cardiomyopathy 09/27/98   WNL, EF 64%  . Urosepsis 01/01-01/05/10   Hospitalization    Past Surgical History:  Procedure Laterality Date  . COLONOSCOPY  07/2013   1 benign polyp rpt 10 yrs [name])  . KNEE ARTHROSCOPY Left 1988  . KNEE SURGERY Right 05/21/03   Right, Dr. Marlou Sa, med meniscus tear via MRI  . LEFT HEART CATH AND CORONARY ANGIOGRAPHY N/A 04/01/2017   Procedure: LEFT HEART CATH AND CORONARY ANGIOGRAPHY;  Surgeon: Martinique, Peter M, MD;  Location: [facility];  Service: Cardiovascular;  Laterality: N/A;  . MYELOGRAM  08/05   bulging disc    Current Medications: Outpatient Medications Prior to Visit  Medication Sig Dispense Refill  . aspirin EC 81 MG tablet Take 81 mg by mouth at bedtime.    . benzonatate (TESSALON) 100 MG capsule Take 1 capsule (100 mg total) by mouth 3 (three) times daily as needed for cough. 30 capsule 0  . Cholecalciferol (VITAMIN D) 2000 units tablet Take 2,000 Units by mouth daily.    . finasteride (PROSCAR) 5 MG tablet Take 1 tablet (5 mg total) by mouth daily. 90 tablet 3  . glucose blood (ONE TOUCH ULTRA TEST) test strip 1 each by Other route See admin instructions. Check blood sugar 3 times weekly.    [name] ibuprofen (ADVIL,MOTRIN) 200 MG tablet Take  600 mg by mouth every 8 (eight) hours as needed (for pain.).    . Insulin Glargine (BASAGLAR KWIKPEN) 100 UNIT/ML SOPN Inject 0.6 mLs (60 Units total) into the skin every morning. And pen needles 1/day (Patient taking differently: Inject 60 Units into the skin daily. And pen needles 1/day) 10 pen 11  . insulin lispro (HUMALOG KWIKPEN) 100 UNIT/ML KiwkPen 3 times a day (just before each meal), 14-6-14 units, and pen needles 4/day (Patient taking differently: Inject 6-14 Units into the skin 3 (three) times daily before meals. 14 units in the morning, 6 units at lunch, & 14 at night) 15 mL 11  . Insulin Pen Needle (PEN NEEDLES 31GX5/16") 31G X 8 MM MISC Use pen needles to inject lantus 10u into skin daily. 100 each 1  . losartan (COZAAR) 25 MG tablet Take 1 tablet (25 mg  total) by mouth daily. 90 tablet 3  . metFORMIN (GLUCOPHAGE) 1000 MG tablet Take 1 tablet (1,000 mg total) by mouth 2 (two) times daily. 180 tablet 3  . metoprolol tartrate (LOPRESSOR) 25 MG tablet Take 0.5 tablets (12.5 mg total) by mouth 2 (two) times daily. 60 tablet 3  . Multiple Vitamin (MULTIVITAMIN WITH MINERALS) TABS tablet Take 1 tablet by mouth daily.    . probenecid (BENEMID) 500 MG tablet TAKE ONE-HALF TABLET BY  MOUTH DAILY 45 tablet 1  . rosuvastatin (CRESTOR) 40 MG tablet Take 1 tablet (40 mg total) by mouth daily. 90 tablet 3  . tamsulosin (FLOMAX) 0.4 MG CAPS capsule Take 1 capsule (0.4 mg total) by mouth daily. 90 capsule 3  . vitamin C (ASCORBIC ACID) 500 MG tablet Take 500 mg by mouth every evening.      No facility-administered medications prior to visit.      Allergies:   Victoza [liraglutide]   Social History   Socioeconomic History  . Marital status: Married    Spouse name: Not on file  . Number of children: 2  . Years of education: Not on file  . Highest education level: Not on file  Occupational History  . Occupation: Maintenance    Employer: [employer]  Social Needs  . Financial resource strain: Not on file  . Food insecurity:    Worry: Not on file    Inability: Not on file  . Transportation needs:    Medical: Not on file    Non-medical: Not on file  Tobacco Use  . Smoking status: Former Smoker    Last attempt to quit: 04/20/1974    Years since quitting: 43.2  . Smokeless tobacco: Never Used  . Tobacco comment: quit over 20 years  Substance and Sexual Activity  . Alcohol use: Yes    Alcohol/week: 1.8 oz    Types: 3 Cans of beer per week    Comment: occasional  . Drug use: No  . Sexual activity: Not on file  Lifestyle  . Physical activity:    Days per week: Not on file    Minutes per session: Not on file  . Stress: Not on file  Relationships  . Social connections:    Talks on phone: Not on file    Gets together: Not on file     Attends religious service: Not on file    Active member of club or organization: Not on file    Attends meetings of clubs or organizations: Not on file    Relationship status: Not on file  Other Topics Concern  . Not on file  Social History Narrative   Lives with wife   Occupation: equipment maintenance   Activity: no regular exercise - hunts   Diet: good water, fruits/vegetables     Family History:  The patient's family history includes CAD (age of onset: 72) in his brother; Cancer in his maternal uncle; Diabetes in his mother; Heart disease in his father and mother; Hypertension in his father; Migraines in his brother.   ROS:   Please see the history of present illness.    ROS All other systems reviewed and are negative.   PHYSICAL EXAM:   VS:  BP 126/82   Pulse 79   Ht 5\' 8"  (1.727 m)   Wt 208 lb 6.4 oz (94.5 kg)   BMI 31.69 kg/m    GEN: Well nourished, well developed, in no acute distress  HEENT: normal  Neck: no JVD, carotid bruits, or masses Cardiac: RRR; no murmurs, rubs, or gallops,no edema  Respiratory:  clear to auscultation bilaterally, normal work of breathing GI: soft, nontender, nondistended, + BS MS: no deformity or atrophy  Skin: warm and dry, no rash Neuro:  Alert and Oriented x 3, Strength and sensation are intact Psych: euthymic mood, full affect  Wt Readings from Last 3 Encounters:  07/15/17 208 lb 6.4 oz (94.5 kg)  06/29/17 203 lb (92.1 kg)  04/15/17 209 lb 12.8 oz (95.2 kg)      Studies/Labs Reviewed:   EKG:  EKG is not ordered today.   Recent Labs: 03/25/2017: BUN 29; Creatinine, Ser 1.27; Hemoglobin 15.6; Platelets 228; Potassium 4.9; Sodium 138 06/21/2017: ALT 44   Lipid Panel    Component Value Date/Time   CHOL 108 06/21/2017 0804   TRIG 98 06/21/2017 0804   HDL 31 (L) 06/21/2017 0804   CHOLHDL 3.5 06/21/2017 0804   CHOLHDL 4 10/16/2016 0840   VLDL 14.4 10/16/2016 0840   LDLCALC 57 06/21/2017 0804    Additional studies/ records  that were reviewed today include:   Coronary CT 03/19/2017 IMPRESSION: 1. Coronary artery calcium score 790 Agatston units, placing the patient in the 89th percentile for age and gender. This suggests high risk for future cardiac events.  2. Extensive plaque in the proximal to mid LAD, possible area of moderate stenosis in the mid LAD.  3.  Moderate OM1 with moderate stenosis proximally.  4.  Mild to possibly moderate mid RCA stenosis.  See CT report from 11/28. This is the FFR result from the 11/28 study.  FFR 0.82 in mid RCA, suggesting borderline significant stenosis. FFR 0.76 in the PDA, suggesting hemodynamically significant disease involving the PDA.     Cath 04/01/2017 Conclusion     Mid LAD to Dist LAD lesion is 45% stenosed.  Mid LAD lesion is 50% stenosed.  Prox LAD lesion is 35% stenosed.  Mid RCA lesion is 25% stenosed.  Ost RPDA lesion is 25% stenosed.  The left ventricular systolic function is normal.  LV end diastolic pressure is normal.  The left ventricular ejection fraction is 55-65% by visual estimate.   1. Nonobstructive CAD 2. Normal LV function.  3. Normal LVEDP  Plan: although CT FFR was abnormal there appears to be nonobstructive disease by angiography. I would recommend continued medical therapy and risk factor modification       ASSESSMENT:    1. Coronary artery disease involving native coronary artery of native heart without angina pectoris   2. Controlled type 2 diabetes mellitus without complication, with long-term current use of insulin ([place])  3. OSA on CPAP   4. Hyperlipidemia, unspecified hyperlipidemia type      PLAN:  In order of problems listed above:  1. CAD: Nonobstructive disease noted on cardiac catheterization 2003, s/p cardiac catheterization Dec 2018 due to abnormal coronary CT, this revealed a mild progression of disease compared to the previous cath, however no critical disease noted.  He is  asymptomatic.  We will need to control risk factor aggressively.  Continue aspirin, metoprolol, high dose Crestor.   2. Hyperlipidemia: LDL much better on high dose Crestor. Now 16.   3. DM 2:   Will need to continue to follow-up with primary care provider and Endocrine for aggressive management. Consider addition of Jardiance if sugar control is not optimal.   4. Obstructive sleep apnea on CPAP: Compliant with CPAP machine.  5. HTN well controlled.     Medication Adjustments/Labs and Tests Ordered: Current medicines are reviewed at length with the patient today.  Concerns regarding medicines are outlined above.  Medication changes, Labs and Tests ordered today are listed in the Patient Instructions below. Patient Instructions  Continue your current therapy  Consider addition of Jardiance if OK with your other docs  I will see you in one year     Signed, Peter Martinique, MD  07/15/2017 4:16 PM    [facility] [address], [place], [place]  [address] Phone: [phone]; Fax: [phone]

## 2017-07-15 ENCOUNTER — Encounter: Payer: Self-pay | Admitting: Cardiology

## 2017-07-15 ENCOUNTER — Ambulatory Visit (INDEPENDENT_AMBULATORY_CARE_PROVIDER_SITE_OTHER): Payer: 59 | Admitting: Cardiology

## 2017-07-15 VITALS — BP 126/82 | HR 79 | Ht 68.0 in | Wt 208.4 lb

## 2017-07-15 DIAGNOSIS — I2510 Atherosclerotic heart disease of native coronary artery without angina pectoris: Secondary | ICD-10-CM

## 2017-07-15 DIAGNOSIS — Z9989 Dependence on other enabling machines and devices: Secondary | ICD-10-CM

## 2017-07-15 DIAGNOSIS — G4733 Obstructive sleep apnea (adult) (pediatric): Secondary | ICD-10-CM

## 2017-07-15 DIAGNOSIS — E785 Hyperlipidemia, unspecified: Secondary | ICD-10-CM

## 2017-07-15 DIAGNOSIS — E119 Type 2 diabetes mellitus without complications: Secondary | ICD-10-CM | POA: Diagnosis not present

## 2017-07-15 DIAGNOSIS — Z794 Long term (current) use of insulin: Secondary | ICD-10-CM | POA: Diagnosis not present

## 2017-07-15 NOTE — Patient Instructions (Addendum)
Continue your current therapy  Consider addition of Jardiance if OK with your other docs  I will see you in one year

## 2017-08-02 ENCOUNTER — Other Ambulatory Visit: Payer: Self-pay | Admitting: Family Medicine

## 2017-08-02 NOTE — Telephone Encounter (Signed)
Refused refills, too soon.

## 2017-08-25 ENCOUNTER — Encounter: Payer: Self-pay | Admitting: Endocrinology

## 2017-08-26 ENCOUNTER — Other Ambulatory Visit: Payer: Self-pay

## 2017-08-26 MED ORDER — INSULIN LISPRO 100 UNIT/ML (KWIKPEN): 6.0000 [IU] | PEN_INJECTOR | Freq: Three times a day (TID) | SUBCUTANEOUS | 3 refills | Status: DC

## 2017-09-01 ENCOUNTER — Other Ambulatory Visit: Payer: Self-pay

## 2017-09-01 MED ORDER — INSULIN LISPRO 100 UNIT/ML (KWIKPEN): PEN_INJECTOR | SUBCUTANEOUS | 3 refills | Status: DC

## 2017-09-01 MED ORDER — INSULIN LISPRO 100 UNIT/ML (KWIKPEN): 6.0000 [IU] | PEN_INJECTOR | Freq: Three times a day (TID) | SUBCUTANEOUS | 3 refills | Status: DC

## 2017-09-15 ENCOUNTER — Other Ambulatory Visit: Payer: Self-pay | Admitting: Family Medicine

## 2017-10-10 ENCOUNTER — Other Ambulatory Visit: Payer: Self-pay | Admitting: Family Medicine

## 2017-10-10 DIAGNOSIS — IMO0002 Reserved for concepts with insufficient information to code with codable children: Secondary | ICD-10-CM

## 2017-10-10 DIAGNOSIS — D751 Secondary polycythemia: Secondary | ICD-10-CM

## 2017-10-10 DIAGNOSIS — E1140 Type 2 diabetes mellitus with diabetic neuropathy, unspecified: Secondary | ICD-10-CM

## 2017-10-10 DIAGNOSIS — D508 Other iron deficiency anemias: Secondary | ICD-10-CM

## 2017-10-10 DIAGNOSIS — E785 Hyperlipidemia, unspecified: Secondary | ICD-10-CM

## 2017-10-10 DIAGNOSIS — N401 Enlarged prostate with lower urinary tract symptoms: Secondary | ICD-10-CM

## 2017-10-10 DIAGNOSIS — N138 Other obstructive and reflux uropathy: Secondary | ICD-10-CM

## 2017-10-10 DIAGNOSIS — E1165 Type 2 diabetes mellitus with hyperglycemia: Secondary | ICD-10-CM

## 2017-10-10 DIAGNOSIS — E559 Vitamin D deficiency, unspecified: Secondary | ICD-10-CM

## 2017-10-10 DIAGNOSIS — E790 Hyperuricemia without signs of inflammatory arthritis and tophaceous disease: Secondary | ICD-10-CM

## 2017-10-11 ENCOUNTER — Other Ambulatory Visit (INDEPENDENT_AMBULATORY_CARE_PROVIDER_SITE_OTHER): Payer: 59

## 2017-10-11 DIAGNOSIS — E1140 Type 2 diabetes mellitus with diabetic neuropathy, unspecified: Secondary | ICD-10-CM | POA: Diagnosis not present

## 2017-10-11 DIAGNOSIS — D508 Other iron deficiency anemias: Secondary | ICD-10-CM | POA: Diagnosis not present

## 2017-10-11 DIAGNOSIS — E790 Hyperuricemia without signs of inflammatory arthritis and tophaceous disease: Secondary | ICD-10-CM

## 2017-10-11 DIAGNOSIS — E559 Vitamin D deficiency, unspecified: Secondary | ICD-10-CM | POA: Diagnosis not present

## 2017-10-11 DIAGNOSIS — N401 Enlarged prostate with lower urinary tract symptoms: Secondary | ICD-10-CM

## 2017-10-11 DIAGNOSIS — E785 Hyperlipidemia, unspecified: Secondary | ICD-10-CM | POA: Diagnosis not present

## 2017-10-11 DIAGNOSIS — IMO0002 Reserved for concepts with insufficient information to code with codable children: Secondary | ICD-10-CM

## 2017-10-11 DIAGNOSIS — E1165 Type 2 diabetes mellitus with hyperglycemia: Secondary | ICD-10-CM

## 2017-10-11 DIAGNOSIS — N138 Other obstructive and reflux uropathy: Secondary | ICD-10-CM

## 2017-10-11 LAB — MICROALBUMIN / CREATININE URINE RATIO
Creatinine,U: 106.6 mg/dL
MICROALB UR: 15.9 mg/dL — AB (ref 0.0–1.9)
Microalb Creat Ratio: 14.9 mg/g (ref 0.0–30.0)

## 2017-10-11 LAB — VITAMIN D 25 HYDROXY (VIT D DEFICIENCY, FRACTURES): VITD: 49.17 ng/mL (ref 30.00–100.00)

## 2017-10-11 LAB — CBC WITH DIFFERENTIAL/PLATELET
Basophils Absolute: 0 10*3/uL (ref 0.0–0.1)
Basophils Relative: 0.6 % (ref 0.0–3.0)
EOS ABS: 0.2 10*3/uL (ref 0.0–0.7)
EOS PCT: 3.5 % (ref 0.0–5.0)
HCT: 43.6 % (ref 39.0–52.0)
HEMOGLOBIN: 15.2 g/dL (ref 13.0–17.0)
Lymphocytes Relative: 22 % (ref 12.0–46.0)
Lymphs Abs: 1.4 10*3/uL (ref 0.7–4.0)
MCHC: 34.9 g/dL (ref 30.0–36.0)
MCV: 87 fl (ref 78.0–100.0)
MONO ABS: 0.5 10*3/uL (ref 0.1–1.0)
Monocytes Relative: 7.2 % (ref 3.0–12.0)
Neutro Abs: 4.3 10*3/uL (ref 1.4–7.7)
Neutrophils Relative %: 66.7 % (ref 43.0–77.0)
Platelets: 204 10*3/uL (ref 150.0–400.0)
RBC: 5.01 Mil/uL (ref 4.22–5.81)
RDW: 13.7 % (ref 11.5–15.5)
WBC: 6.5 10*3/uL (ref 4.0–10.5)

## 2017-10-11 LAB — COMPREHENSIVE METABOLIC PANEL
ALK PHOS: 44 U/L (ref 39–117)
ALT: 23 U/L (ref 0–53)
AST: 22 U/L (ref 0–37)
Albumin: 4.1 g/dL (ref 3.5–5.2)
BILIRUBIN TOTAL: 0.5 mg/dL (ref 0.2–1.2)
BUN: 23 mg/dL (ref 6–23)
CO2: 26 mEq/L (ref 19–32)
Calcium: 10.1 mg/dL (ref 8.4–10.5)
Chloride: 106 mEq/L (ref 96–112)
Creatinine, Ser: 1.06 mg/dL (ref 0.40–1.50)
GFR: 74.22 mL/min (ref >60.00)
GLUCOSE: 174 mg/dL — AB (ref 70–99)
POTASSIUM: 4.3 meq/L (ref 3.5–5.1)
SODIUM: 141 meq/L (ref 135–145)
TOTAL PROTEIN: 6.4 g/dL (ref 6.0–8.3)

## 2017-10-11 LAB — HEMOGLOBIN A1C: Hgb A1c MFr Bld: 12.7 % — ABNORMAL HIGH (ref 4.6–6.5)

## 2017-10-11 LAB — IBC PANEL
Iron: 74 ug/dL (ref 42–165)
SATURATION RATIOS: 19.2 % — AB (ref 20.0–50.0)
TRANSFERRIN: 276 mg/dL (ref 212.0–360.0)

## 2017-10-11 LAB — FERRITIN: Ferritin: 123.7 ng/mL (ref 22.0–322.0)

## 2017-10-11 LAB — LIPID PANEL
CHOLESTEROL: 93 mg/dL (ref 0–200)
HDL: 29 mg/dL — ABNORMAL LOW (ref >39.00)
LDL Cholesterol: 44 mg/dL (ref 0–99)
NONHDL: 63.99
Total CHOL/HDL Ratio: 3
Triglycerides: 99 mg/dL (ref 0.0–149.0)
VLDL: 19.8 mg/dL (ref 0.0–40.0)

## 2017-10-11 LAB — PSA: PSA: 0.53 ng/mL (ref 0.10–4.00)

## 2017-10-11 LAB — URIC ACID: URIC ACID, SERUM: 4.7 mg/dL (ref 4.0–7.8)

## 2017-10-18 ENCOUNTER — Ambulatory Visit (INDEPENDENT_AMBULATORY_CARE_PROVIDER_SITE_OTHER): Payer: 59 | Admitting: Family Medicine

## 2017-10-18 ENCOUNTER — Encounter: Payer: Self-pay | Admitting: Family Medicine

## 2017-10-18 VITALS — BP 124/76 | HR 84 | Temp 97.8°F | Ht 66.5 in | Wt 202.8 lb

## 2017-10-18 DIAGNOSIS — D751 Secondary polycythemia: Secondary | ICD-10-CM

## 2017-10-18 DIAGNOSIS — E559 Vitamin D deficiency, unspecified: Secondary | ICD-10-CM | POA: Diagnosis not present

## 2017-10-18 DIAGNOSIS — E66811 Obesity, class 1: Secondary | ICD-10-CM

## 2017-10-18 DIAGNOSIS — IMO0002 Reserved for concepts with insufficient information to code with codable children: Secondary | ICD-10-CM

## 2017-10-18 DIAGNOSIS — E785 Hyperlipidemia, unspecified: Secondary | ICD-10-CM

## 2017-10-18 DIAGNOSIS — N138 Other obstructive and reflux uropathy: Secondary | ICD-10-CM

## 2017-10-18 DIAGNOSIS — D508 Other iron deficiency anemias: Secondary | ICD-10-CM

## 2017-10-18 DIAGNOSIS — Z0000 Encounter for general adult medical examination without abnormal findings: Secondary | ICD-10-CM | POA: Diagnosis not present

## 2017-10-18 DIAGNOSIS — I2510 Atherosclerotic heart disease of native coronary artery without angina pectoris: Secondary | ICD-10-CM

## 2017-10-18 DIAGNOSIS — E1140 Type 2 diabetes mellitus with diabetic neuropathy, unspecified: Secondary | ICD-10-CM

## 2017-10-18 DIAGNOSIS — E669 Obesity, unspecified: Secondary | ICD-10-CM

## 2017-10-18 DIAGNOSIS — Z23 Encounter for immunization: Secondary | ICD-10-CM | POA: Diagnosis not present

## 2017-10-18 DIAGNOSIS — N401 Enlarged prostate with lower urinary tract symptoms: Secondary | ICD-10-CM

## 2017-10-18 DIAGNOSIS — E1165 Type 2 diabetes mellitus with hyperglycemia: Secondary | ICD-10-CM

## 2017-10-18 DIAGNOSIS — I10 Essential (primary) hypertension: Secondary | ICD-10-CM

## 2017-10-18 MED ORDER — FERROUS SULFATE 325 (65 FE) MG PO TABS: 325.0000 mg | ORAL_TABLET | Freq: Every day | ORAL | 3 refills | Status: DC | PRN

## 2017-10-18 NOTE — Patient Instructions (Addendum)
Pneumovax today Sign release so we can get latest eye exam from Dr Marica Otter.  Sugars remaining too high - schedule follow up with Dr Loanne Drilling - discuss jardiance with him.   Health Maintenance, Male A healthy lifestyle and preventive care is important for your health and wellness. Ask your health care provider about what schedule of regular examinations is right for you. What should I know about weight and diet? Eat a Healthy Diet  Eat plenty of vegetables, fruits, whole grains, low-fat dairy products, and lean protein.  Do not eat a lot of foods high in solid fats, added sugars, or salt.  Maintain a Healthy Weight Regular exercise can help you achieve or maintain a healthy weight. You should:  Do at least 150 minutes of exercise each week. The exercise should increase your heart rate and make you sweat (moderate-intensity exercise).  Do strength-training exercises at least twice a week.  Watch Your Levels of Cholesterol and Blood Lipids  Have your blood tested for lipids and cholesterol every 5 years starting at 66 years of age. If you are at high risk for heart disease, you should start having your blood tested when you are 66 years old. You may need to have your cholesterol levels checked more often if: ? Your lipid or cholesterol levels are high. ? You are older than 66 years of age. ? You are at high risk for heart disease.  What should I know about cancer screening? Many types of cancers can be detected early and may often be prevented. Lung Cancer  You should be screened every year for lung cancer if: ? You are a current smoker who has smoked for at least 30 years. ? You are a former smoker who has quit within the past 15 years.  Talk to your health care provider about your screening options, when you should start screening, and how often you should be screened.  Colorectal Cancer  Routine colorectal cancer screening usually begins at 66 years of age and should be  repeated every 5-10 years until you are 66 years old. You may need to be screened more often if early forms of precancerous polyps or small growths are found. Your health care provider may recommend screening at an earlier age if you have risk factors for colon cancer.  Your health care provider may recommend using home test kits to check for hidden blood in the stool.  A small camera at the end of a tube can be used to examine your colon (sigmoidoscopy or colonoscopy). This checks for the earliest forms of colorectal cancer.  Prostate and Testicular Cancer  Depending on your age and overall health, your health care provider may do certain tests to screen for prostate and testicular cancer.  Talk to your health care provider about any symptoms or concerns you have about testicular or prostate cancer.  Skin Cancer  Check your skin from head to toe regularly.  Tell your health care provider about any new moles or changes in moles, especially if: ? There is a change in a mole's size, shape, or color. ? You have a mole that is larger than a pencil eraser.  Always use sunscreen. Apply sunscreen liberally and repeat throughout the day.  Protect yourself by wearing long sleeves, pants, a wide-brimmed hat, and sunglasses when outside.  What should I know about heart disease, diabetes, and high blood pressure?  If you are 19-51 years of age, have your blood pressure checked every 3-5 years. If  you are 30 years of age or older, have your blood pressure checked every year. You should have your blood pressure measured twice-once when you are at a hospital or clinic, and once when you are not at a hospital or clinic. Record the average of the two measurements. To check your blood pressure when you are not at a hospital or clinic, you can use: ? An automated blood pressure machine at a pharmacy. ? A home blood pressure monitor.  Talk to your health care provider about your target blood  pressure.  If you are between 56-78 years old, ask your health care provider if you should take aspirin to prevent heart disease.  Have regular diabetes screenings by checking your fasting blood sugar level. ? If you are at a normal weight and have a low risk for diabetes, have this test once every three years after the age of 56. ? If you are overweight and have a high risk for diabetes, consider being tested at a younger age or more often.  A one-time screening for abdominal aortic aneurysm (AAA) by ultrasound is recommended for men aged 65-75 years who are current or former smokers. What should I know about preventing infection? Hepatitis B If you have a higher risk for hepatitis B, you should be screened for this virus. Talk with your health care provider to find out if you are at risk for hepatitis B infection. Hepatitis C Blood testing is recommended for:  Everyone born from [birth year] through 1964.  Anyone with known risk factors for hepatitis C.  Sexually Transmitted Diseases (STDs)  You should be screened each year for STDs including gonorrhea and chlamydia if: ? You are sexually active and are younger than 66 years of age. ? You are older than 66 years of age and your health care provider tells you that you are at risk for this type of infection. ? Your sexual activity has changed since you were last screened and you are at an increased risk for chlamydia or gonorrhea. Ask your health care provider if you are at risk.  Talk with your health care provider about whether you are at high risk of being infected with HIV. Your health care provider may recommend a prescription medicine to help prevent HIV infection.  What else can I do?  Schedule regular health, dental, and eye exams.  Stay current with your vaccines (immunizations).  Do not use any tobacco products, such as cigarettes, chewing tobacco, and e-cigarettes. If you need help quitting, ask your health care  provider.  Limit alcohol intake to no more than 2 drinks per day. One drink equals 12 ounces of beer, 5 ounces of wine, or 1 ounces of hard liquor.  Do not use street drugs.  Do not share needles.  Ask your health care provider for help if you need support or information about quitting drugs.  Tell your health care provider if you often feel depressed.  Tell your health care provider if you have ever been abused or do not feel safe at home. This information is not intended to replace advice given to you by your health care provider. Make sure you discuss any questions you have with your health care provider. Document Released: 10/03/2007 Document Revised: 12/04/2015 Document Reviewed: 01/08/2015 Elsevier Interactive Patient Education  [employer].

## 2017-10-18 NOTE — Progress Notes (Signed)
BP 124/76 (BP Location: Left Arm, Patient Position: Sitting, Cuff Size: Normal)   Pulse 84   Temp 97.8 F (36.6 C) (Oral)   Ht 5' 6.5" (1.689 m)   Wt 202 lb 12 oz (92 kg)   SpO2 95%   BMI 32.23 kg/m    CC: CPE Subjective:    Patient ID: Alan Morrison, male    DOB: 1950-12-31, 67 y.o.   MRN: [id number]  HPI: Alan Morrison is a 67 y.o. male presenting on 10/18/2017 for Annual Exam   DM - last year referred to Dr Loanne Drilling. cbg's up to 280. Checks 4-5 times per week, different times of the day.   Established with cardiology to follow CAD. Dr Martinique suggested jardiance.   Preventative: COLONOSCOPY Date: 07/2013 1 benign polyp rpt 10 yrs [name])  Prostate - normal in the past. Would like to continue checking yearly.  Flu shot yearly at work  Pneumovax 2013, prevnar 2018.  Tdap 2016  Zostavax - 08/2013  Shingrix - completed series 2019 Seat belt use discussed.  Sunscreen use discussed. No changing moles.  Non smoker  Alcohol - occasional beer Dentist - Q93mo Eye exam - yearly  Lives with wife  Occupation: equipment maintenance  Activity: walking - limited by knees Diet: good water, daily fruits/vegetables, diet drinks   Relevant past medical, surgical, family and social history reviewed and updated as indicated. Interim medical history since our last visit reviewed. Allergies and medications reviewed and updated. Outpatient Medications Prior to Visit  Medication Sig Dispense Refill  . aspirin EC 81 MG tablet Take 81 mg by mouth at bedtime.    . Cholecalciferol (VITAMIN D) 2000 units tablet Take 2,000 Units by mouth daily.    . finasteride (PROSCAR) 5 MG tablet TAKE 1 TABLET BY MOUTH  DAILY 90 tablet 0  . glucose blood (ONE TOUCH ULTRA TEST) test strip 1 each by Other route See admin instructions. Check blood sugar 3 times weekly.    [name] ibuprofen (ADVIL,MOTRIN) 200 MG tablet Take 600 mg by mouth every 8 (eight) hours as needed (for pain.).    . Insulin Glargine (BASAGLAR  KWIKPEN) 100 UNIT/ML SOPN Inject 0.6 mLs (60 Units total) into the skin every morning. And pen needles 1/day (Patient taking differently: Inject 60 Units into the skin daily. And pen needles 1/day) 10 pen 11  . insulin lispro (HUMALOG KWIKPEN) 100 UNIT/ML KiwkPen Inject into skin 14 units in the morning, 6 units at lunch, & 14 units at night. 20 pen 3  . Insulin Pen Needle (PEN NEEDLES 31GX5/16") 31G X 8 MM MISC Use pen needles to inject lantus 10u into skin daily. 100 each 1  . losartan (COZAAR) 25 MG tablet Take 1 tablet (25 mg total) by mouth daily. 90 tablet 3  . metFORMIN (GLUCOPHAGE) 1000 MG tablet TAKE 1 TABLET BY MOUTH TWO  TIMES DAILY 180 tablet 0  . Multiple Vitamin (MULTIVITAMIN WITH MINERALS) TABS tablet Take 1 tablet by mouth daily.    . probenecid (BENEMID) 500 MG tablet TAKE ONE-HALF TABLET BY  MOUTH DAILY 45 tablet 1  . tamsulosin (FLOMAX) 0.4 MG CAPS capsule TAKE 1 CAPSULE BY MOUTH  DAILY 90 capsule 0  . vitamin C (ASCORBIC ACID) 500 MG tablet Take 500 mg by mouth every evening.     . metFORMIN (GLUCOPHAGE) 1000 MG tablet Take 1 tablet (1,000 mg total) by mouth 2 (two) times daily. 180 tablet 3  . metoprolol tartrate (LOPRESSOR) 25 MG tablet Take 0.5 tablets (12.5  mg total) by mouth 2 (two) times daily. 60 tablet 3  . rosuvastatin (CRESTOR) 40 MG tablet Take 1 tablet (40 mg total) by mouth daily. 90 tablet 3  . benzonatate (TESSALON) 100 MG capsule Take 1 capsule (100 mg total) by mouth 3 (three) times daily as needed for cough. 30 capsule 0   No facility-administered medications prior to visit.      Per HPI unless specifically indicated in ROS section below Review of Systems  Constitutional: Negative for activity change, appetite change, chills, fatigue, fever and unexpected weight change.  HENT: Negative for hearing loss.   Eyes: Negative for visual disturbance.  Respiratory: Negative for cough, chest tightness, shortness of breath and wheezing.   Cardiovascular: Negative  for chest pain, palpitations and leg swelling.  Gastrointestinal: Negative for abdominal distention, abdominal pain, blood in stool, constipation, diarrhea, nausea and vomiting.  Genitourinary: Negative for difficulty urinating and hematuria.  Musculoskeletal: Negative for arthralgias, myalgias and neck pain.  Skin: Negative for rash.  Neurological: Positive for dizziness (at times when getting ready for bed). Negative for seizures, syncope and headaches.  Hematological: Negative for adenopathy. Does not bruise/bleed easily.  Psychiatric/Behavioral: Negative for dysphoric mood. The patient is not nervous/anxious.        Objective:    BP 124/76 (BP Location: Left Arm, Patient Position: Sitting, Cuff Size: Normal)   Pulse 84   Temp 97.8 F (36.6 C) (Oral)   Ht 5' 6.5" (1.689 m)   Wt 202 lb 12 oz (92 kg)   SpO2 95%   BMI 32.23 kg/m   Wt Readings from Last 3 Encounters:  10/18/17 202 lb 12 oz (92 kg)  07/15/17 208 lb 6.4 oz (94.5 kg)  06/29/17 203 lb (92.1 kg)    Physical Exam  Constitutional: He is oriented to person, place, and time. He appears well-developed and well-nourished. No distress.  HENT:  Head: Normocephalic and atraumatic.  Right Ear: Hearing, tympanic membrane, external ear and ear canal normal.  Left Ear: Hearing, tympanic membrane, external ear and ear canal normal.  Nose: Nose normal.  Mouth/Throat: Uvula is midline, oropharynx is clear and moist and mucous membranes are normal. No oropharyngeal exudate, posterior oropharyngeal edema or posterior oropharyngeal erythema.  Eyes: Pupils are equal, round, and reactive to light. Conjunctivae and EOM are normal. No scleral icterus.  Neck: Normal range of motion. Neck supple. Carotid bruit is not present. No thyromegaly present.  Cardiovascular: Normal rate, regular rhythm, normal heart sounds and intact distal pulses.  No murmur heard. Pulses:      Radial pulses are 2+ on the right side, and 2+ on the left side.    Pulmonary/Chest: Effort normal and breath sounds normal. No respiratory distress. He has no wheezes. He has no rales.  Abdominal: Soft. Bowel sounds are normal. He exhibits no distension and no mass. There is no tenderness. There is no rebound and no guarding.  Genitourinary: Rectum normal and prostate normal. Rectal exam shows no external hemorrhoid, no internal hemorrhoid, no fissure, no mass, no tenderness and anal tone normal. Prostate is not enlarged (15gm) and not tender.  Musculoskeletal: Normal range of motion. He exhibits no edema.  Lymphadenopathy:    He has no cervical adenopathy.  Neurological: He is alert and oriented to person, place, and time.  CN grossly intact, station and gait intact  Skin: Skin is warm and dry. No rash noted.  Psychiatric: He has a normal mood and affect. His behavior is normal. Judgment and thought content normal.  Nursing note and vitals reviewed.  Diabetic Foot Exam - Simple   Simple Foot Form Diabetic Foot exam was performed with the following findings:  Yes 10/18/2017  3:07 PM  Visual Inspection No deformities, no ulcerations, no other skin breakdown bilaterally:  Yes Sensation Testing Intact to touch and monofilament testing bilaterally:  Yes Pulse Check Posterior Tibialis and Dorsalis pulse intact bilaterally:  Yes Comments Callus formation right medial great toe     Results for orders placed or performed in visit on 10/18/17  HM DIABETES EYE EXAM  Result Value Ref Range   HM Diabetic Eye Exam No Retinopathy No Retinopathy      Assessment & Plan:   Problem List Items Addressed This Visit    Vitamin D deficiency    Levels now normal range - continue 2000 IU daily.       Type 2 diabetes, uncontrolled, with neuropathy (HCC)    Chronic, again deteriorated. Advised he call endo to schedule f/u appt as due. Consider jardiance for cardiovascular benefit.       Polycythemia    CBC WNL today.       Obesity, Class I, BMI 30-34.9     Encouraged ongoing healthy diet and lifestyle changes to affect sustainable weight loss.       HLD (hyperlipidemia)    Chronic, stable on crestor. LDL at goal. The ASCVD Risk score Mikey Bussing DC Jr., et al., 2013) failed to calculate for the following reasons:   The valid total cholesterol range is 130 to 320 mg/dL       Health maintenance examination - Primary    Preventative protocols reviewed and updated unless pt declined. Discussed healthy diet and lifestyle.       Essential hypertension, benign    Chronic, stable. Continue current regimen       CAD (coronary artery disease)    Appreciate cards care.       Benign prostatic hyperplasia with urinary obstruction    Continue flomax, proscar PSA/DRE stable.       Anemia, iron deficiency, inadequate dietary intake    Levels stable. He has restarted PRN iron OTC      Relevant Medications   ferrous sulfate 325 (65 FE) MG tablet    Other Visit Diagnoses    Need for 23-polyvalent pneumococcal polysaccharide vaccine       Relevant Orders   Pneumococcal polysaccharide vaccine 23-valent greater than or equal to 2yo subcutaneous/IM (Completed)       Meds ordered this encounter  Medications  . ferrous sulfate 325 (65 FE) MG tablet    Sig: Take 1 tablet (325 mg total) by mouth daily as needed (fatigue).    Refill:  3   Orders Placed This Encounter  Procedures  . Pneumococcal polysaccharide vaccine 23-valent greater than or equal to 2yo subcutaneous/IM  . [facility] DIABETES EYE EXAM    This external order was created through the Results Console.    Follow up plan: Return in about 6 months (around 04/20/2018) for follow up visit.  [name], MD

## 2017-10-18 NOTE — Assessment & Plan Note (Signed)
Preventative protocols reviewed and updated unless pt declined. Discussed healthy diet and lifestyle.  

## 2017-10-19 NOTE — Assessment & Plan Note (Signed)
Encouraged ongoing healthy diet and lifestyle changes to affect sustainable weight loss.  

## 2017-10-19 NOTE — Assessment & Plan Note (Signed)
Chronic, stable on crestor. LDL at goal. The ASCVD Risk score Mikey Bussing DC Jr., et al., 2013) failed to calculate for the following reasons:   The valid total cholesterol range is 130 to 320 mg/dL

## 2017-10-19 NOTE — Assessment & Plan Note (Signed)
CBC WNL today.

## 2017-10-19 NOTE — Assessment & Plan Note (Signed)
Chronic, again deteriorated. Advised he call endo to schedule f/u appt as due. Consider jardiance for cardiovascular benefit.

## 2017-10-19 NOTE — Assessment & Plan Note (Deleted)
Appreciate cards care. 

## 2017-10-19 NOTE — Assessment & Plan Note (Signed)
Levels now normal range - continue 2000 IU daily.

## 2017-10-19 NOTE — Assessment & Plan Note (Signed)
Appreciate cards care. 

## 2017-10-19 NOTE — Assessment & Plan Note (Addendum)
Continue flomax, proscar PSA/DRE stable.

## 2017-10-19 NOTE — Assessment & Plan Note (Signed)
Chronic, stable. Continue current regimen. 

## 2017-10-19 NOTE — Assessment & Plan Note (Signed)
Levels stable. He has restarted PRN iron OTC

## 2017-10-21 IMAGING — US US ABDOMEN COMPLETE
1 series · 13 of 25 positions shown · non-contrast
Comparison: None in PACs

CLINICAL DATA: Diabetes with unexplained loss of goal ICM it
control. History of polycythemia, coronary artery disease, former
smoker.

EXAM:
ABDOMEN ULTRASOUND COMPLETE

[Series 1: us abdomen complete · 0.32mm/px · 13 of 84 slices shown]
[im 1/84]
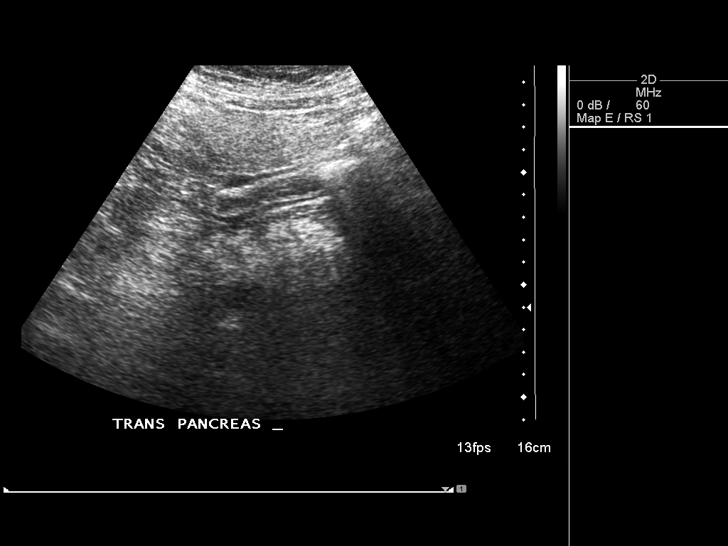
[im 7/84]
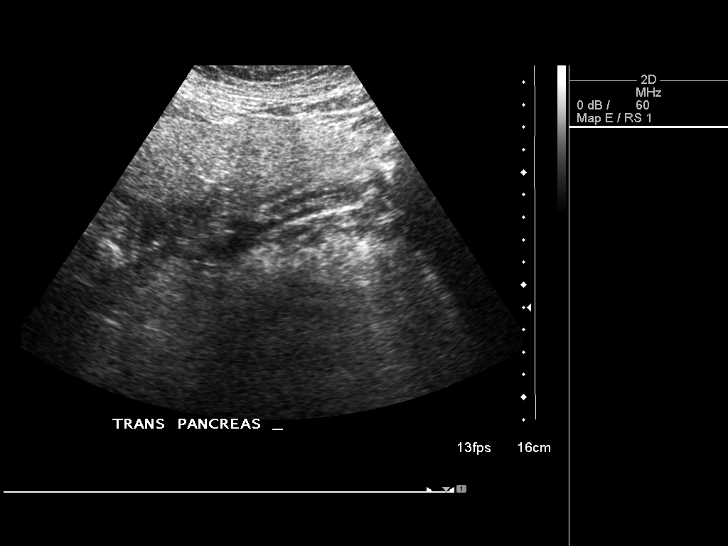
[im 14/84]
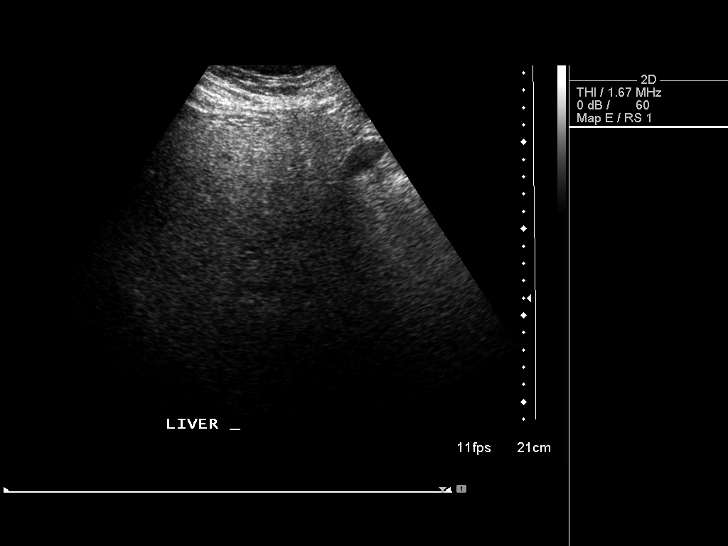
[im 21/84]
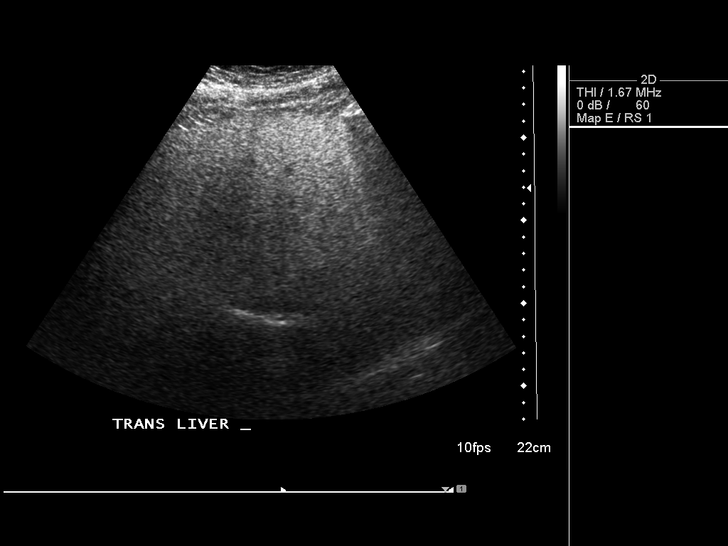
[im 28/84]
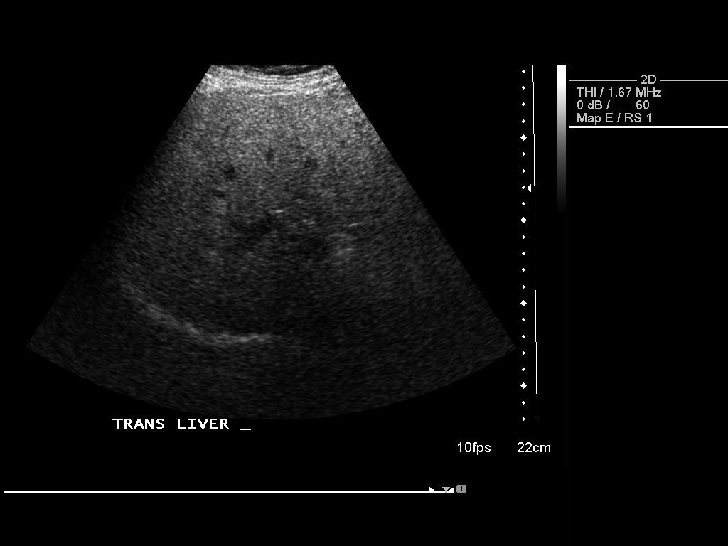
[im 35/84]
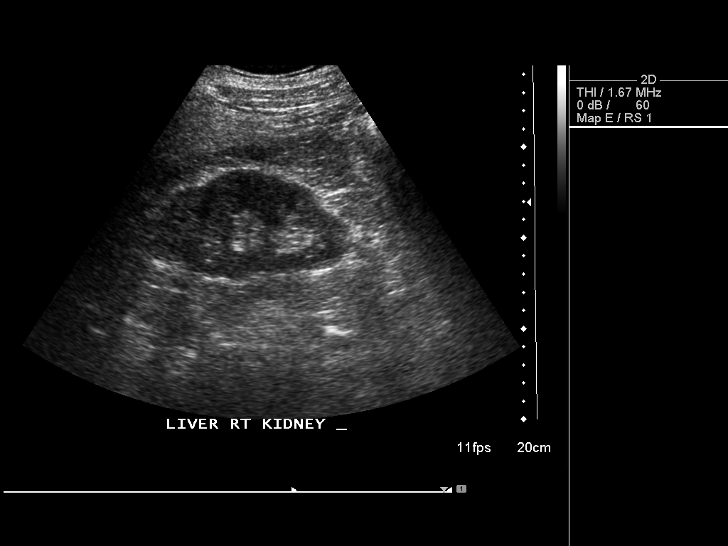
[im 42/84]
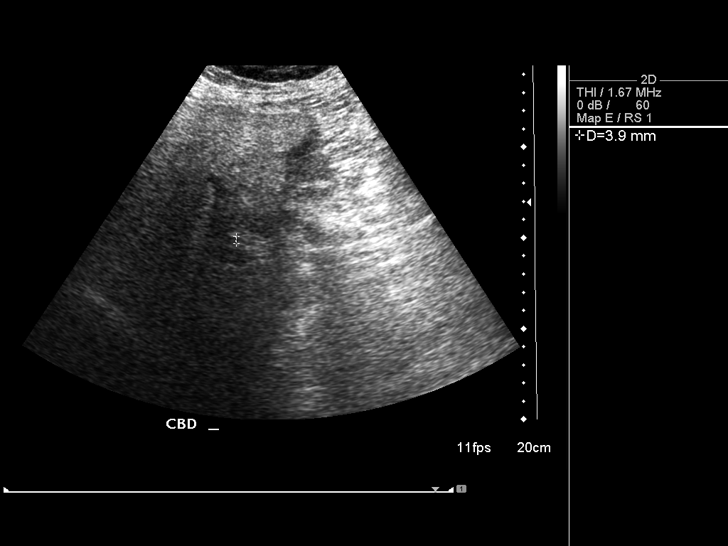
[im 49/84]
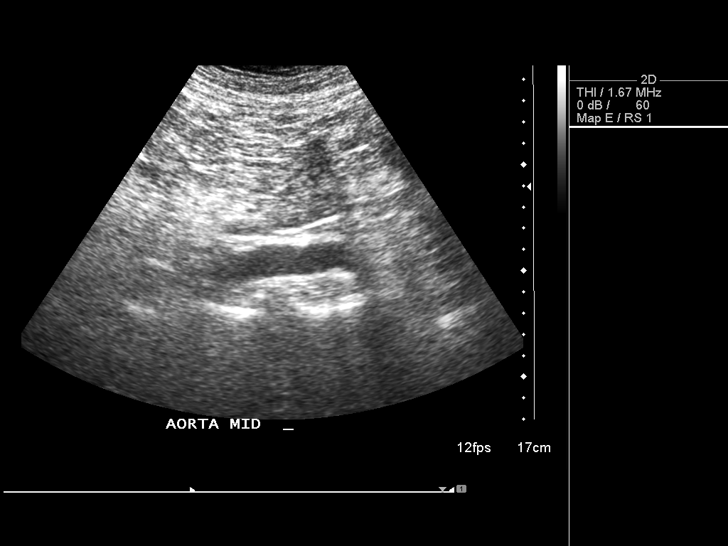
[im 56/84]
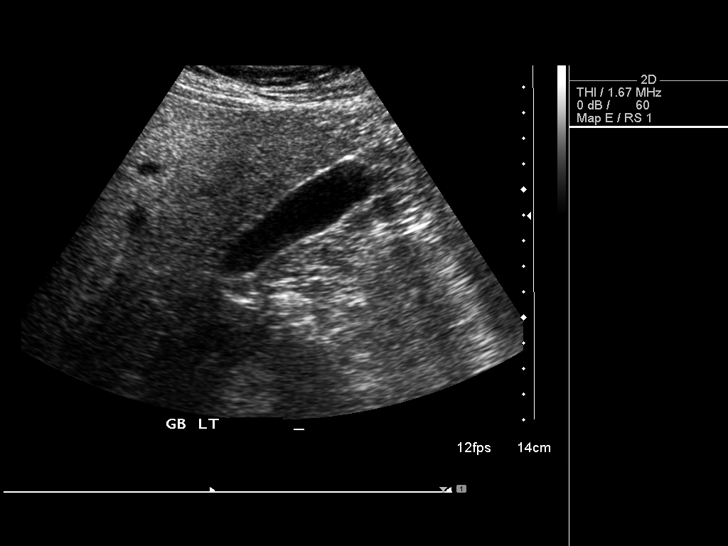
[im 63/84]
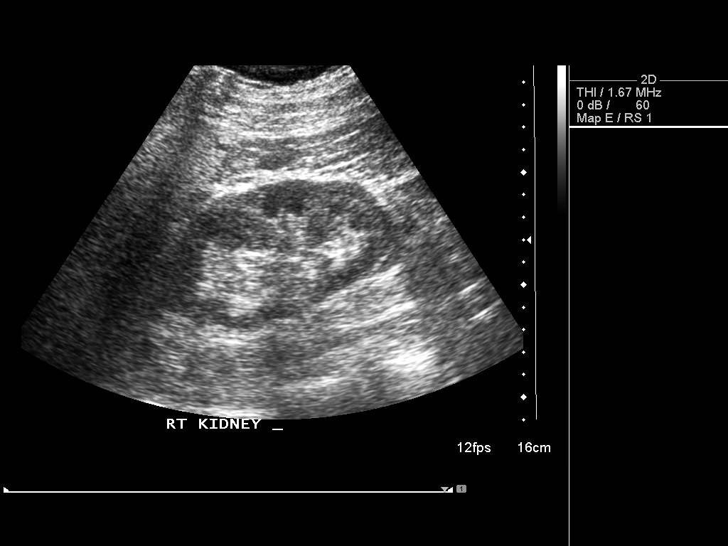
[im 70/84]
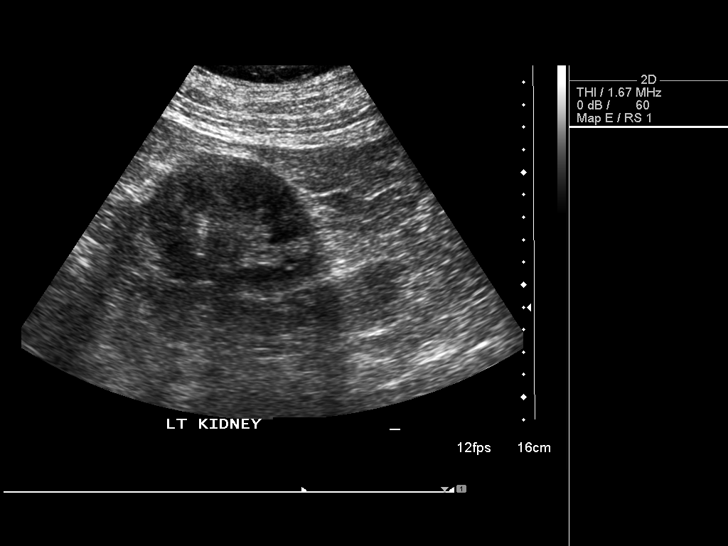
[im 77/84]
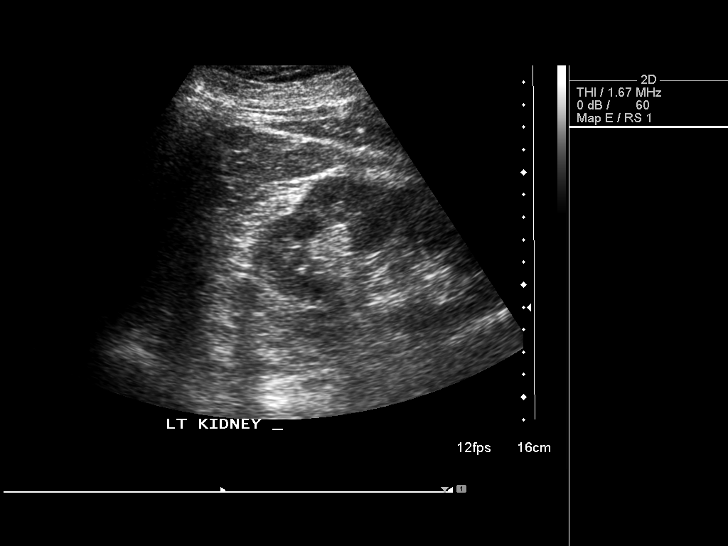
[im 84/84]
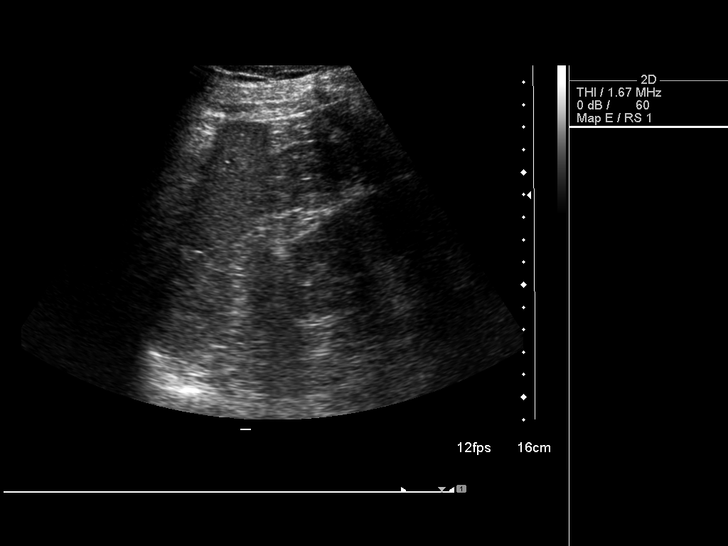

[13 of 25 positions shown; findings below may reference images not displayed]

FINDINGS: Gallbladder: No gallstones or wall thickening visualized. No
sonographic Murphy sign noted by sonographer.

Common bile duct: Diameter: 4.7 mm

Liver: The hepatic echotexture is mildly increased diffusely. There
is no focal mass nor ductal dilation. The surface contour of the
liver is normal.

IVC: No abnormality visualized.

Pancreas: Bowel gas obscures the pancreatic head and tail and most
of the pancreatic body.

Spleen: Size and appearance within normal limits.

Right Kidney: Length: 12 cm. Echogenicity within normal limits. No
mass or hydronephrosis visualized.

Left Kidney: Length: 12.3 cm. The renal cortical echotexture is
normal with exception of increased echotexture in the in the lower
pole cortex measuring 1.4 x 1 x 1.3 cm. It is not hypervascular.

Abdominal aorta: Visualization of the abdominal aorta is limited due
to bowel gas.

Other findings: No ascites.
IMPRESSION: Probable fatty infiltrative change of the liver. No discrete hepatic
mass. Nonvisualization of the pancreas. Further evaluation of the
abdomen with CT scanning or MRI would be useful if additional
evaluation of the pancreas and liver is desired.

Probable 1.4 cm angiomyolipoma in the lower pole of the left kidney.

## 2017-11-09 ENCOUNTER — Other Ambulatory Visit: Payer: Self-pay | Admitting: Family Medicine

## 2017-11-09 NOTE — Telephone Encounter (Signed)
Pharmacy requests refills on metformin, finasteride and tamsulosin:  Last OV: 10/18/17 (Annual) Next OV: None scheduled * needs 6 month follow up  Last Refill:  Metformin #180, 0 refills on 09/16/17 Tamsulosin #90, 0 refills on 09/16/17 Finasteride #90, 0 refills on 09/16/17  Will refill X 6 mths to carry through next appt.

## 2017-11-25 ENCOUNTER — Other Ambulatory Visit: Payer: Self-pay | Admitting: Physician Assistant

## 2017-11-25 NOTE — Telephone Encounter (Signed)
Rx sent to pharmacy   

## 2017-11-29 ENCOUNTER — Encounter: Payer: Self-pay | Admitting: Family Medicine

## 2017-12-25 ENCOUNTER — Encounter: Payer: Self-pay | Admitting: Endocrinology

## 2017-12-27 ENCOUNTER — Other Ambulatory Visit: Payer: Self-pay | Admitting: Endocrinology

## 2018-01-11 IMAGING — DX DG HIP (WITH OR WITHOUT PELVIS) 2-3V*R*
3 series · 3 of 3 positions shown · non-contrast
Comparison: None in PACs

CLINICAL DATA: Right hip pain.  No mention of injury.

EXAM:
DG HIP (WITH OR WITHOUT PELVIS) 2-3V RIGHT

[pelvis ap]
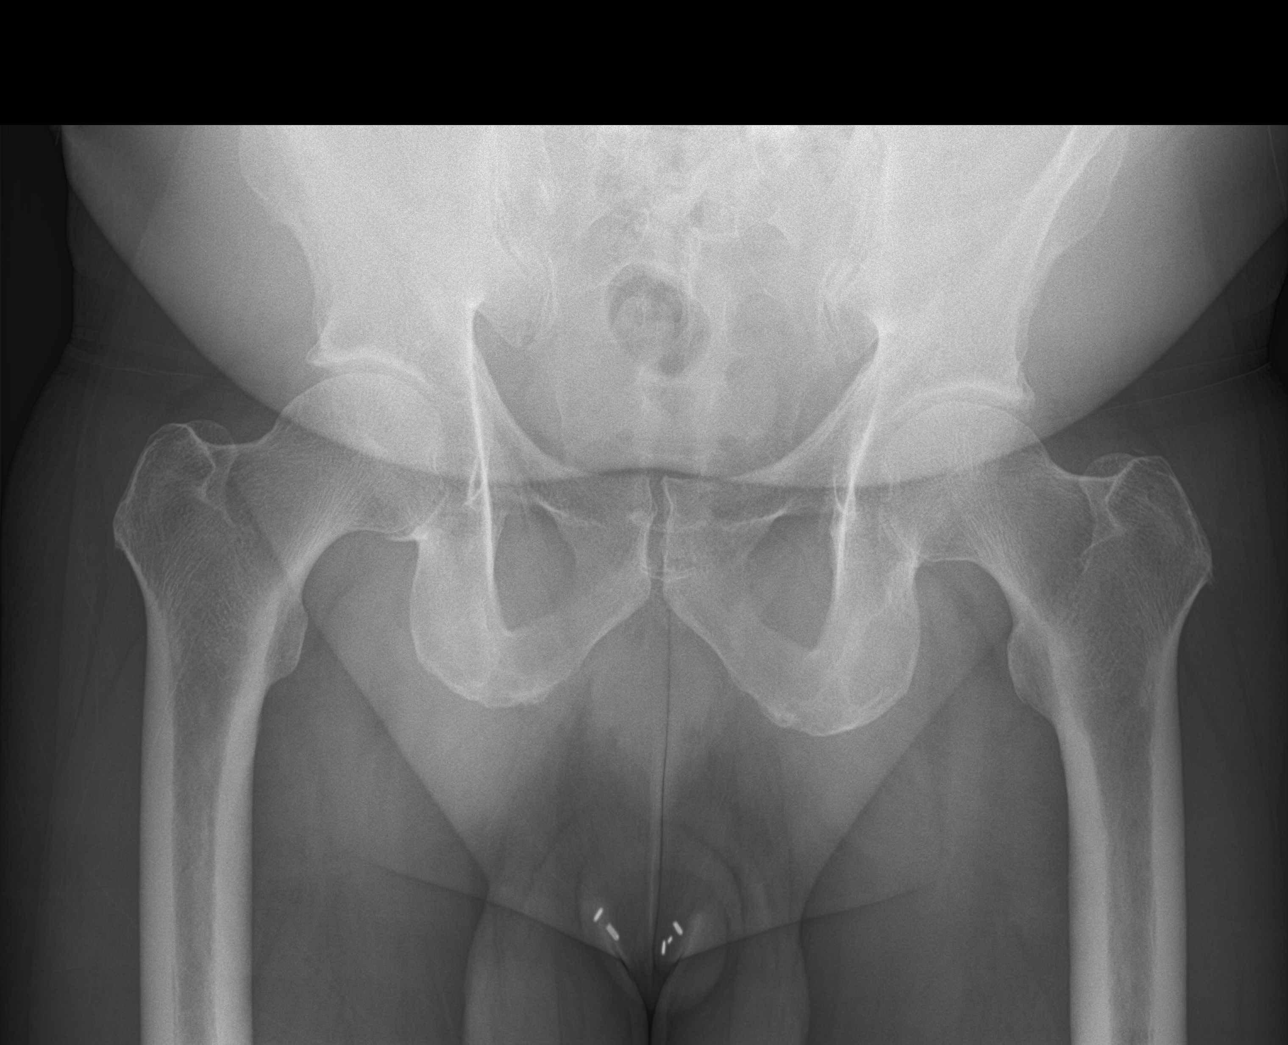

[hip ap]
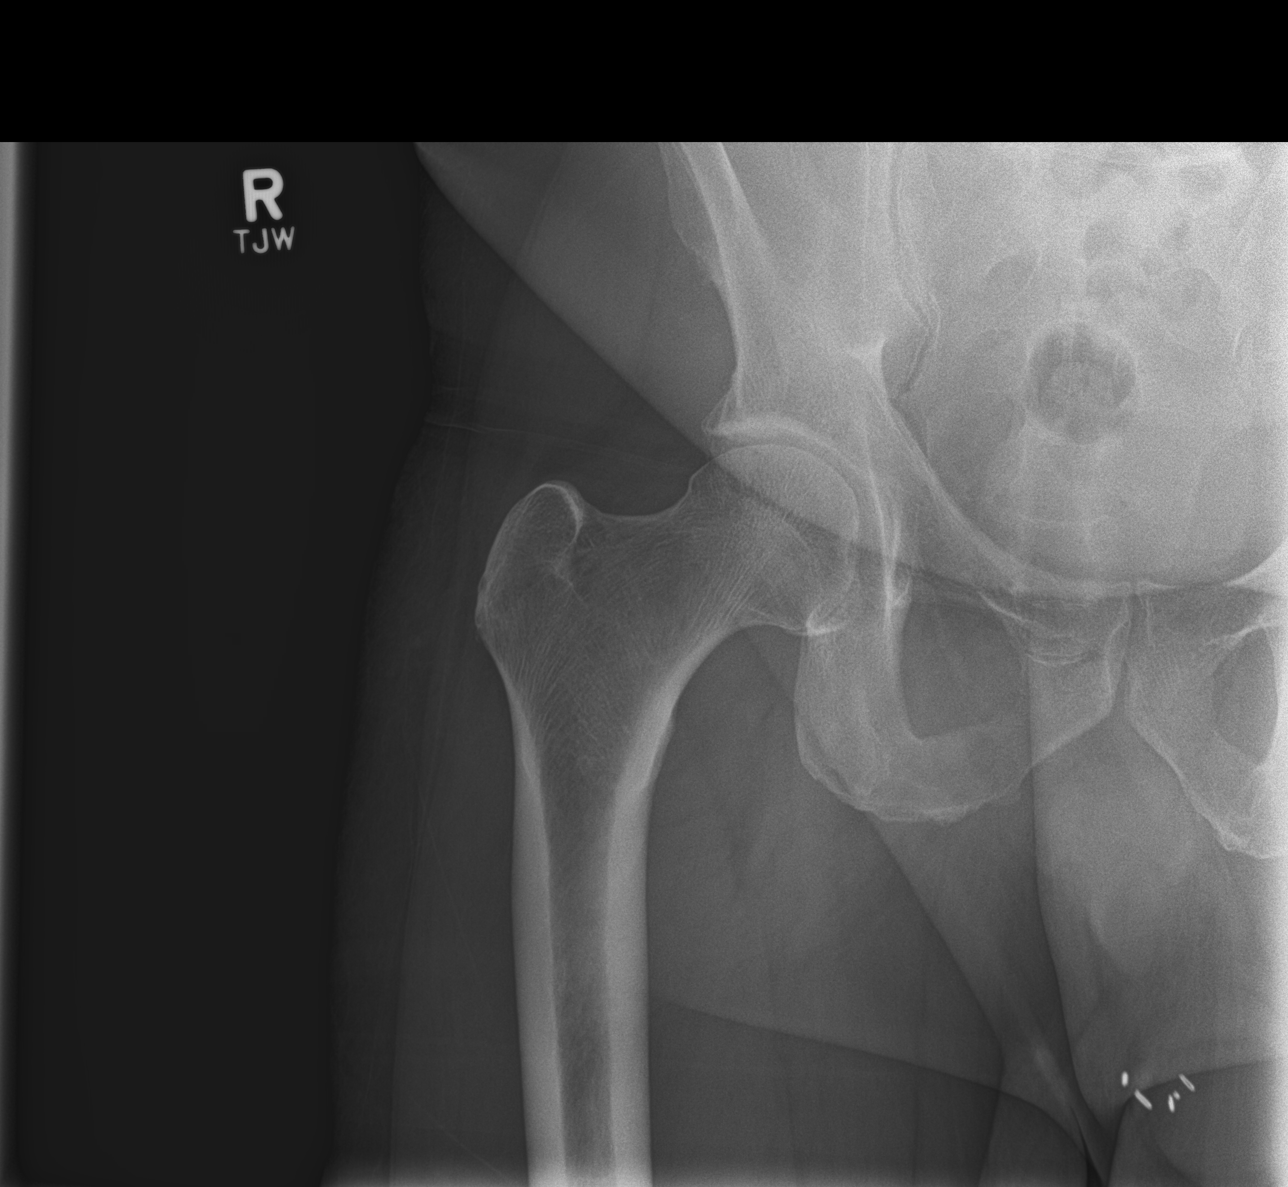

[hip lat]
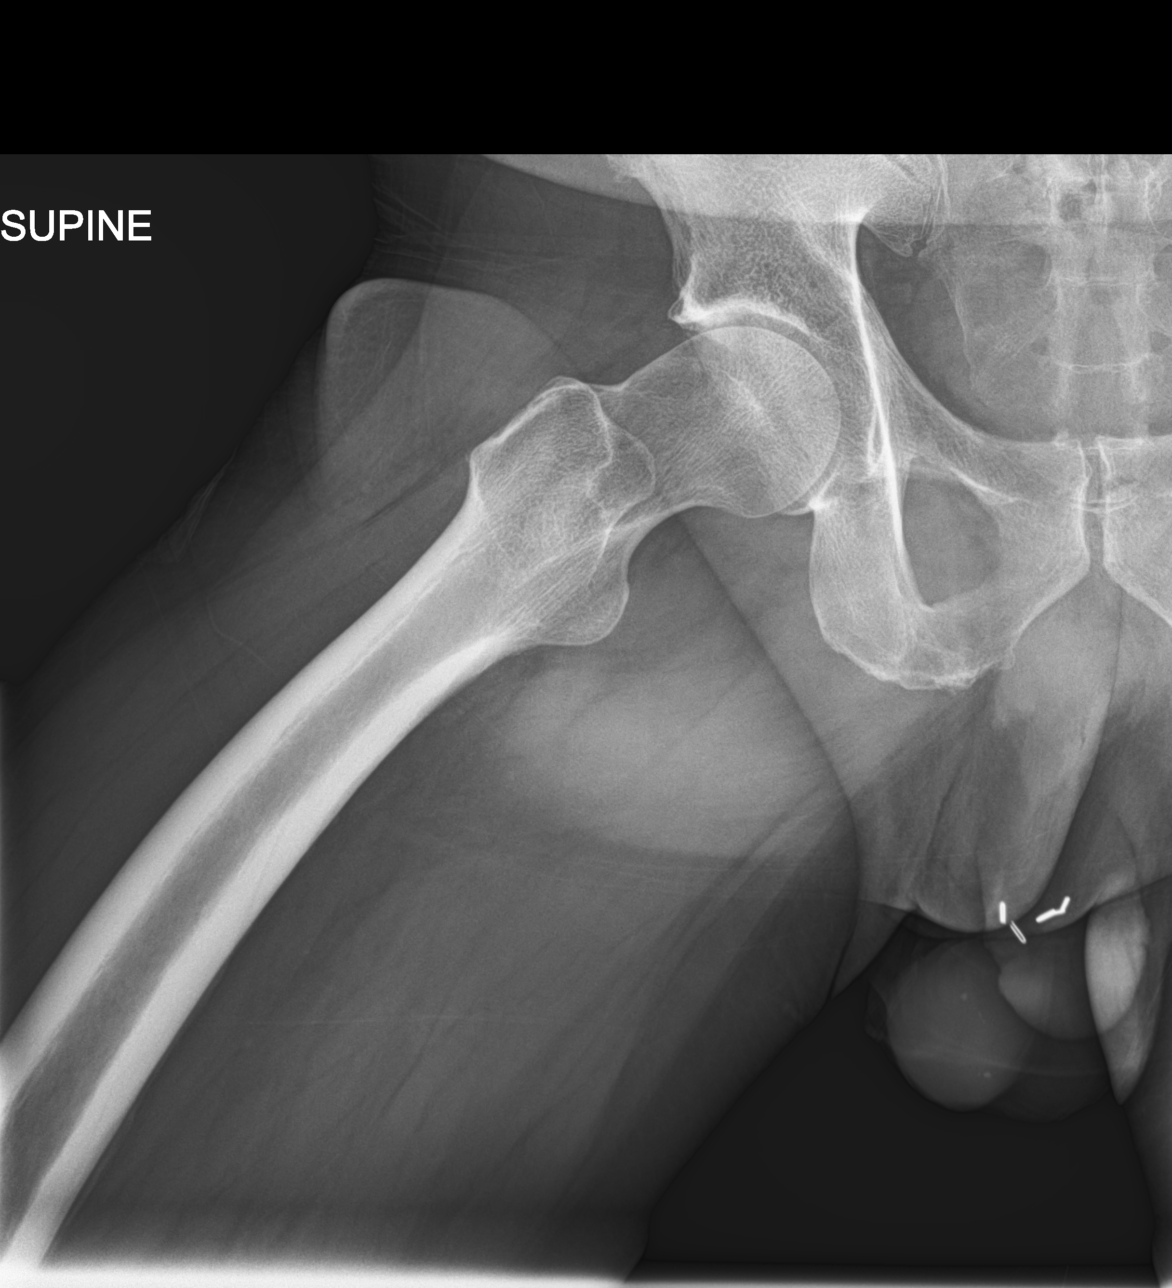

[3 of 3 positions shown; findings below may reference images not displayed]

FINDINGS: The bony pelvis is subjectively adequately mineralized. There is no
lytic or blastic lesion nor acute or healing fracture. AP and
lateral views of the the right hip reveal minimal symmetric
narrowing of the joint space. No significant osteophyte formation is
observed. There is no acute or old fracture. The femoral neck,
intertrochanteric, and subtrochanteric regions are normal.
IMPRESSION: Mild symmetric joint space loss of the right hip likely reflects
osteoarthritis. No acute bony abnormality.

## 2018-01-20 ENCOUNTER — Other Ambulatory Visit: Payer: Self-pay | Admitting: Endocrinology

## 2018-01-28 ENCOUNTER — Other Ambulatory Visit: Payer: Self-pay | Admitting: Endocrinology

## 2018-01-28 NOTE — Telephone Encounter (Signed)
Please refill x 1 Ov is due  

## 2018-01-28 NOTE — Telephone Encounter (Signed)
Last ov 02/19/17 refill or refuse

## 2018-01-31 NOTE — Telephone Encounter (Signed)
Please refill x 1 Ov is due  

## 2018-01-31 NOTE — Telephone Encounter (Signed)
Please advise on refill.

## 2018-02-01 MED ORDER — BASAGLAR KWIKPEN 100 UNIT/ML ~~LOC~~ SOPN: PEN_INJECTOR | SUBCUTANEOUS | 0 refills | Status: DC

## 2018-02-08 ENCOUNTER — Other Ambulatory Visit: Payer: Self-pay | Admitting: Family Medicine

## 2018-03-08 ENCOUNTER — Other Ambulatory Visit: Payer: Self-pay | Admitting: Endocrinology

## 2018-03-28 ENCOUNTER — Encounter: Payer: Self-pay | Admitting: Family Medicine

## 2018-03-28 ENCOUNTER — Other Ambulatory Visit: Payer: Self-pay | Admitting: Endocrinology

## 2018-03-28 NOTE — Telephone Encounter (Signed)
Do we refill these meds for the pt?

## 2018-03-29 NOTE — Telephone Encounter (Signed)
Last OV 02/19/17 refill or refuse please advise

## 2018-03-29 NOTE — Telephone Encounter (Signed)
Please refill x 1 Ov is due  

## 2018-04-28 ENCOUNTER — Other Ambulatory Visit: Payer: Self-pay | Admitting: Endocrinology

## 2018-04-28 ENCOUNTER — Other Ambulatory Visit: Payer: Self-pay

## 2018-04-29 ENCOUNTER — Telehealth: Payer: Self-pay

## 2018-04-29 ENCOUNTER — Encounter: Payer: Self-pay | Admitting: Family Medicine

## 2018-04-29 ENCOUNTER — Ambulatory Visit (HOSPITAL_COMMUNITY)
Admission: RE | Admit: 2018-04-29 | Discharge: 2018-04-29 | Disposition: A | Discharge status: Home / Self Care | Payer: 59 | Source: Ambulatory Visit | Attending: Family Medicine | Admitting: Family Medicine

## 2018-04-29 ENCOUNTER — Ambulatory Visit (INDEPENDENT_AMBULATORY_CARE_PROVIDER_SITE_OTHER): Payer: 59 | Admitting: Family Medicine

## 2018-04-29 ENCOUNTER — Telehealth: Payer: Self-pay | Admitting: Internal Medicine

## 2018-04-29 VITALS — BP 132/70 | HR 86 | Temp 97.8°F | Ht 66.5 in | Wt 209.5 lb

## 2018-04-29 DIAGNOSIS — M7989 Other specified soft tissue disorders: Secondary | ICD-10-CM

## 2018-04-29 DIAGNOSIS — E1140 Type 2 diabetes mellitus with diabetic neuropathy, unspecified: Secondary | ICD-10-CM

## 2018-04-29 DIAGNOSIS — E1165 Type 2 diabetes mellitus with hyperglycemia: Secondary | ICD-10-CM | POA: Diagnosis not present

## 2018-04-29 DIAGNOSIS — I82412 Acute embolism and thrombosis of left femoral vein: Secondary | ICD-10-CM

## 2018-04-29 DIAGNOSIS — M79662 Pain in left lower leg: Secondary | ICD-10-CM | POA: Diagnosis present

## 2018-04-29 DIAGNOSIS — IMO0002 Reserved for concepts with insufficient information to code with codable children: Secondary | ICD-10-CM

## 2018-04-29 HISTORY — DX: Acute embolism and thrombosis of left femoral vein: I82.412

## 2018-04-29 LAB — POCT GLYCOSYLATED HEMOGLOBIN (HGB A1C): Hemoglobin A1C: 13.3 % — AB (ref 4.0–5.6)

## 2018-04-29 MED ORDER — OMEPRAZOLE 20 MG PO CPDR: 20.0000 mg | DELAYED_RELEASE_CAPSULE | Freq: Every day | ORAL | 3 refills | Status: DC

## 2018-04-29 MED ORDER — ROSUVASTATIN CALCIUM 40 MG PO TABS: 40.0000 mg | ORAL_TABLET | Freq: Every day | ORAL | 0 refills | Status: DC

## 2018-04-29 MED ORDER — RIVAROXABAN (XARELTO) VTE STARTER PACK (15 & 20 MG): ORAL_TABLET | ORAL | 0 refills | Status: DC

## 2018-04-29 MED ORDER — APIXABAN 5 MG PO TABS: 5.0000 mg | ORAL_TABLET | Freq: Two times a day (BID) | ORAL | 0 refills | Status: DC

## 2018-04-29 NOTE — Progress Notes (Signed)
BP 132/70 (BP Location: Left Arm, Patient Position: Sitting, Cuff Size: Normal)   Pulse 86   Temp 97.8 F (36.6 C) (Oral)   Ht 5' 6.5" (1.689 m)   Wt 209 lb 8 oz (95 kg)   SpO2 96%   BMI 33.31 kg/m    CC: L knee/calf pain Subjective:    Patient ID: Alan Morrison, male    DOB: Jun 30, 1950, 68 y.o.   MRN: [id number]  HPI: Alan Morrison is a 68 y.o. male presenting on 04/29/2018 for Knee Pain (C/o left posterior knee pain and swelling radiating down to the foot. Started about 1 mo ago. Tried ibuprofen. Pt accompanied by his wife, [name]. )   1 mo h/o L leg pain that started behind knee and travels down lateral leg to calf. Swelling posterior knee to lateral leg started 2 wks ago. Over last few days left foot numbness has developed. Denies inciting trauma/injury. Treating with ibuprofen 400-600mg  with limited benefit.   No recent prolonged car or plane rides.  No personal h/o blood clots Mother with h/o DVT  H/o knee issues, bilateral arthroscopies. Latest R knee 2005 ([place]) medial meniscal tear. Had euflexa injections x3 to R knee 2018 ([place]).   DM - not checking sugars. Has not followed up with endo since 02/2017.      Lab Results  Component Value Date   HGBA1C 13.3 (A) 04/29/2018     Relevant past medical, surgical, family and social history reviewed and updated as indicated. Interim medical history since our last visit reviewed. Allergies and medications reviewed and updated. Outpatient Medications Prior to Visit  Medication Sig Dispense Refill  . aspirin EC 81 MG tablet Take 81 mg by mouth at bedtime.    . Cholecalciferol (VITAMIN D) 2000 units tablet Take 2,000 Units by mouth daily.    . ferrous sulfate 325 (65 FE) MG tablet Take 1 tablet (325 mg total) by mouth daily as needed (fatigue).  3  . finasteride (PROSCAR) 5 MG tablet TAKE 1 TABLET BY MOUTH  DAILY 90 tablet 1  . glucose blood (ONE TOUCH ULTRA TEST) test strip 1 each by Other route See admin instructions.  Check blood sugar 3 times weekly.    [name] HUMALOG KWIKPEN 100 UNIT/ML KwikPen INJECT 3 TIMES A DAY (JUST BEFORE EACH MEAL), 14-6-14 UNITS, AND PEN NEEDLES 4/DAY 6 pen 0  . ibuprofen (ADVIL,MOTRIN) 200 MG tablet Take 600 mg by mouth every 8 (eight) hours as needed (for pain.).    . Insulin Glargine (BASAGLAR KWIKPEN) 100 UNIT/ML SOPN INJECT 60 UNITS TOTAL INTO THE SKIN EVERY MORNING. 15 pen 0  . Insulin Pen Needle (B-D ULTRAFINE III SHORT PEN) 31G X 8 MM MISC USE AS DIRECTED DAILY 100 each 0  . losartan (COZAAR) 25 MG tablet Take 1 tablet (25 mg total) by mouth daily. 90 tablet 3  . metFORMIN (GLUCOPHAGE) 1000 MG tablet TAKE 1 TABLET BY MOUTH TWO  TIMES DAILY 180 tablet 1  . Multiple Vitamin (MULTIVITAMIN WITH MINERALS) TABS tablet Take 1 tablet by mouth daily.    . probenecid (BENEMID) 500 MG tablet TAKE ONE-HALF TABLET BY  MOUTH DAILY 45 tablet 1  . rosuvastatin (CRESTOR) 40 MG tablet Take 1 tablet (40 mg total) by mouth daily. <PLEASE MAKE APPOINTMENT FOR REFILLS> 90 tablet 0  . tamsulosin (FLOMAX) 0.4 MG CAPS capsule TAKE 1 CAPSULE BY MOUTH  DAILY 90 capsule 1  . vitamin C (ASCORBIC ACID) 500 MG tablet Take 500 mg by mouth every evening.     [name]  insulin lispro (HUMALOG KWIKPEN) 100 UNIT/ML KiwkPen Inject into skin 14 units in the morning, 6 units at lunch, & 14 units at night. 20 pen 3  . metoprolol tartrate (LOPRESSOR) 25 MG tablet TAKE 0.5 TABLETS (12.5 MG TOTAL) BY MOUTH 2 (TWO) TIMES DAILY. 30 tablet 7   No facility-administered medications prior to visit.      Per HPI unless specifically indicated in ROS section below Review of Systems Objective:    BP 132/70 (BP Location: Left Arm, Patient Position: Sitting, Cuff Size: Normal)   Pulse 86   Temp 97.8 F (36.6 C) (Oral)   Ht 5' 6.5" (1.689 m)   Wt 209 lb 8 oz (95 kg)   SpO2 96%   BMI 33.31 kg/m   Wt Readings from Last 3 Encounters:  04/29/18 209 lb 8 oz (95 kg)  10/18/17 202 lb 12 oz (92 kg)  07/15/17 208 lb 6.4 oz (94.5 kg)      Physical Exam Vitals signs and nursing note reviewed.  Constitutional:      Appearance: Normal appearance. He is not ill-appearing.  Musculoskeletal: Normal range of motion.        General: Swelling and tenderness present.     Right lower leg: No edema.     Left lower leg: Edema present.     Comments: 2+ DP bilaterally R calf circ 39cm L calf circ 42cm No palpable cords Evident swelling L lateral leg/calf into ankle/foot  Skin:    General: Skin is warm and dry.     Findings: No erythema.  Neurological:     Mental Status: He is alert.    Diabetic Foot Exam - Simple   Simple Foot Form Visual Inspection No deformities, no ulcerations, no other skin breakdown bilaterally:  Yes Sensation Testing See comments:  Yes Pulse Check Posterior Tibialis and Dorsalis pulse intact bilaterally:  Yes Comments Intact sensation to light touch        Results for orders placed or performed in visit on 04/29/18  POCT glycosylated hemoglobin (Hb A1C)  Result Value Ref Range   Hemoglobin A1C 13.3 (A) 4.0 - 5.6 %   HbA1c POC (<> result, manual entry)     HbA1c, POC (prediabetic range)     HbA1c, POC (controlled diabetic range)     Assessment & Plan:   Problem List Items Addressed This Visit    Type 2 diabetes, uncontrolled, with neuropathy ([place])    Update A1c. Markedly elevated last check 09/2017 - did not see endo, not checking sugars regularly. States he's taking basaglar regularly, and humalog 25u BID with meals (forgets lunch). Again emphasized need to f/u with endo - he will call and schedule appointment.       Relevant Orders   POCT glycosylated hemoglobin (Hb A1C) (Completed)   Pain and swelling of left lower leg - Primary    ?Baker's cyst causing symptoms - will need to r/o DVT - discussed both possibilities. Will order STAT venous ultrasound at hospital for today. Pt and wife agree with plan.       Relevant Orders   VAS [place] LOWER EXTREMITY VENOUS (DVT)       No orders of  the defined types were placed in this encounter.  Orders Placed This Encounter  Procedures  . POCT glycosylated hemoglobin (Hb A1C)    Follow up plan: No follow-ups on file.  [name], MD

## 2018-04-29 NOTE — Assessment & Plan Note (Signed)
Update A1c. Markedly elevated last check 09/2017 - did not see endo, not checking sugars regularly. States he's taking basaglar regularly, and humalog 25u BID with meals (forgets lunch). Again emphasized need to f/u with endo - he will call and schedule appointment.

## 2018-04-29 NOTE — Patient Instructions (Addendum)
Call Dr Loanne Drilling for diabetes follow up as you're overdue. A1c today. For left leg - see Rosaria Ferries to schedule ultrasound of leg [facility]. We will be in touch with results.  Need to rule out blood clot, possible baker's cyst causing symptoms.

## 2018-04-29 NOTE — Telephone Encounter (Signed)
Spoke with imaging tech - L proximal femoral DVT into popliteal calf vein. Spoke with wife - start eliquis 10mg  bid x 1 wk then 5mg  bid. Will also start omeprazole 20mg  daily.  Stop NSAIDs. Ok to continue aspirin.  Discussed red flags to seek ER care (PE precautions and GI bleed precautions).  RTC 2-3 wks f/u visit.

## 2018-04-29 NOTE — Telephone Encounter (Signed)
Alan Morrison with [facility] called report; pt has +DVT. Dr Darnell Level will speak with Alan Morrison.

## 2018-04-29 NOTE — Assessment & Plan Note (Signed)
?  Baker's cyst causing symptoms - will need to r/o DVT - discussed both possibilities. Will order STAT venous ultrasound at hospital for today. Pt and wife agree with plan.

## 2018-04-29 NOTE — Telephone Encounter (Signed)
Seen today. 

## 2018-04-29 NOTE — Progress Notes (Signed)
Positive for DVT from the Proxima left  FV through to the popliteal vein and into the proximal calf.

## 2018-04-29 NOTE — Telephone Encounter (Signed)
PC from [facility] at 10PM Diagnosed with DVT and Rx for eliquis---but this was denied by the insurance Too late to fill something else Starter pack for xarelto sent---seems to be covered (with instructions to fill lovenox 90 mg bid instead if this isn't covered ---for ~1mg /kg bid) Will need to be straightened out on Monday  Will have RN instruct him to go to ER or call 911 for any CP or SOB

## 2018-04-29 NOTE — Telephone Encounter (Signed)
Rx(s) sent to pharmacy electronically.  

## 2018-04-29 NOTE — Telephone Encounter (Signed)
Alan Morrison, front desk mgr said received my chart message about lt knee pain to calf with soreness and swelling. Unable to reach pt at any contact # and left v/m on cell for pt to call office. Alan Morrison I am not sure if you mycharted him back if we would get quicker response. Dr Darnell Level does have appts this AM.

## 2018-04-29 NOTE — Telephone Encounter (Signed)
I spoke with pt; pt has lt knee pain for awhile but calf pain started 3-4 wks ago. Now pt has lt knee and calf pain with soreness , feeling warm to touch, swelling; no redness or red streaks, no CP or SOB. Pt scheduled appt 04/29/18 at 10:45 with Dr Darnell Level. If pt develops severe pain in leg or CP or SOB prior to appt pt will go to ED otherwise will see Dr Darnell Level today at 10:45. FYI to Dr Darnell Level.

## 2018-05-02 ENCOUNTER — Encounter: Payer: Self-pay | Admitting: Family Medicine

## 2018-05-02 NOTE — Telephone Encounter (Addendum)
Noted. plz call - was he able to start xarelto for DVT?  plz schedule f/u appt in 2-3 wks.

## 2018-05-02 NOTE — Telephone Encounter (Signed)
See latest phone note

## 2018-05-02 NOTE — Telephone Encounter (Signed)
Left message on vm per dpr relaying Dr. Synthia Innocent message.   Asked pt to call back to let Dr. Darnell Level know if he started Xarelto and for pt to schedule 2-3 wk f/ OV.

## 2018-05-02 NOTE — Telephone Encounter (Signed)
[facility] Patient Name: Alan Morrison Gender: Male DOB: [birth year] Age: 68 Y 10 M 16 D Return Phone Number: [phone] (Primary), [phone] (Secondary) Address[place] [address] Client [facility] Night - Client Client Site Twining Physician [name] - MD Contact Type Call Who Is Calling Patient / Member / Family / Caregiver Call Type Triage / Clinical Relationship To Patient Self Return Phone Number [phone] (Primary) Chief Complaint Prescription Refill or Medication Request (non symptomatic) Reason for Call Medication Question / Request Initial Comment Caller states was prescribed a blood thinner but the insurance won't cover it. Needs another one called in. Translation No Nurse Assessment Nurse: Allene Dillon, RN, Tabatha Date/Time (Eastern Time): 04/29/2018 9:28:01 PM Please select the assessment type ---Verbal order / New medication order Additional Documentation ---Caller states was prescribed a blood thinner but the insurance won't cover it. Needs another one called in. Does the client directives allow for assistance with medications after hours? ---Yes Other current medications? ---Unknown Medication allergies? ---Unknown Pharmacy name and phone number. ---[facility] [address] [phone] Does the client directive allow for RN to call in the medication order to the pharmacy? ---No Guidelines Guideline Title Affirmed Question Affirmed Notes Nurse Date/Time (Eastern Time) Disp. Time Eilene Ghazi Time) Disposition Final User 04/29/2018 8:45:03 PM Send To Nurse Dina Rich, RN, Todd 04/29/2018 9:57:26 PM Paged On Call back to [facility], RN, Tabatha 04/29/2018 10:12:10 PM Clinical Call Yes Allene Dillon, RN, Tabatha PLEASE NOTE: All timestamps contained within this report are  represented as [place] Standard Time. CONFIDENTIALTY NOTICE: This fax transmission is intended only for the addressee. It contains information that is legally privileged, confidential or otherwise protected from use or disclosure. If you are not the intended recipient, you are strictly prohibited from reviewing, disclosing, copying using or disseminating any of this information or taking any action in reliance on or regarding this information. If you have received this fax in error, please notify us immediately by telephone so that we can arrange for its return to [place]. Phone: [phone], Toll-Free: [phone], Fax: [phone] Page: 2 of 2 Call Id: [id number] Paging DoctorName Phone DateTime Result/Outcome Message Type Notes Viviana Simpler - MD [phone] 04/29/2018 9:57:26 PM Paged On Call Back to Call Center Doctor Paged Please call Tabatha at the [facility] call center [phone] Viviana Simpler - MD 04/29/2018 10:09:02 PM Spoke with On Call - General Message Result md to call in alternative medication

## 2018-05-02 NOTE — Telephone Encounter (Signed)
Noted. This was when xarelto was phoned in.

## 2018-05-02 NOTE — Telephone Encounter (Signed)
Fyi to Dr. Coralie Keens Pt Msg, 05/02/17.]

## 2018-05-03 ENCOUNTER — Ambulatory Visit: Payer: 59 | Admitting: Family Medicine

## 2018-05-25 ENCOUNTER — Encounter: Payer: Self-pay | Admitting: Family Medicine

## 2018-05-25 ENCOUNTER — Ambulatory Visit (INDEPENDENT_AMBULATORY_CARE_PROVIDER_SITE_OTHER): Payer: 59 | Admitting: Family Medicine

## 2018-05-25 ENCOUNTER — Ambulatory Visit: Payer: 59 | Admitting: Endocrinology

## 2018-05-25 VITALS — BP 124/66 | HR 93 | Temp 97.8°F | Ht 66.5 in | Wt 205.5 lb

## 2018-05-25 DIAGNOSIS — IMO0002 Reserved for concepts with insufficient information to code with codable children: Secondary | ICD-10-CM

## 2018-05-25 DIAGNOSIS — E1140 Type 2 diabetes mellitus with diabetic neuropathy, unspecified: Secondary | ICD-10-CM

## 2018-05-25 DIAGNOSIS — E1165 Type 2 diabetes mellitus with hyperglycemia: Secondary | ICD-10-CM | POA: Diagnosis not present

## 2018-05-25 DIAGNOSIS — I82412 Acute embolism and thrombosis of left femoral vein: Secondary | ICD-10-CM | POA: Diagnosis not present

## 2018-05-25 MED ORDER — GLUCOSE BLOOD VI STRP: ORAL_STRIP | 3 refills | Status: DC

## 2018-05-25 MED ORDER — RIVAROXABAN 20 MG PO TABS: 20.0000 mg | ORAL_TABLET | Freq: Every day | ORAL | 6 refills | Status: DC

## 2018-05-25 NOTE — Patient Instructions (Addendum)
Sugars are looking better! Keep follow up with endo next month. We will refer you to hematology for evaluation.  Continue xarelto 20mg  daily new prescription at pharmacy.  Leg is looking better - continue xarelto May try compression stockings - prescription provided today.  Return in July for physical.

## 2018-05-25 NOTE — Assessment & Plan Note (Signed)
Pt endorses marked improvement in glycemic control based on daily cbg's at home since he's become more compliant with diabetic regimen and diabetic diet. Congratulated. Did encourage endo f/u regardless - has this scheduled next month.

## 2018-05-25 NOTE — Assessment & Plan Note (Signed)
1st unprovoked DVT. Extensive by venous doppler 04/2018. Tolerating xarelto, will need at least 3 months of treatment, with strong consideration for lifelong anticoagulation as long as tolerated. Did offer heme eval for recommendations on duration of therapy. Pt agrees.

## 2018-05-25 NOTE — Progress Notes (Signed)
BP 124/66 (BP Location: Left Arm, Patient Position: Sitting, Cuff Size: Normal)   Pulse 93   Temp 97.8 F (36.6 C) (Oral)   Ht 5' 6.5" (1.689 m)   Wt 205 lb 8 oz (93.2 kg)   SpO2 97%   BMI 32.67 kg/m    CC: 1 mo f/u visit Subjective:    Patient ID: Alan Morrison, male    DOB: [birth year], 68 y.o.   MRN: [id number]  HPI: Alan Morrison is a 68 y.o. male presenting on 05/25/2018 for Follow-up (Here for blood clot f/u.)   See prior note for details.   Seen here last month with L leg pain and swelling for weeks. Venous US showed LLE DVT from proximal femoral vein to popliteal vein into proximal calf (04/29/2018). Insurance did not cover eliquis so xarelto starter pack was started. Currently on xarelto 20mg  daily. Persistent calf soreness as well as some swelling at inner ankle. No upper leg pain/swelling. Denies chest pain, dyspnea, dizziness.  Has not been using PPI.   DM - A1c poorly controlled (13.3) last month despite regular metformin 1000mg  bid, basaglar 60 daily and humalog 25u BID - has not returned to see endo, appt scheduled 06/2018. He has been more compliant with medical regimen and has noted significant improvements. Drastic changes in diet, sugars averaging 80-160s. Now on basaglar 70u daily and humalog 25u after breakfast and dinner, 10u after lunch.   Cancer screenings: COLONOSCOPY Date: 07/2013 1 benign polyp rpt 10 yrs [name]) Prostate cancer screens normal at annual exam (last 10/2017) Minimal smoker as teen.  Denies abd pain, unexpected weight loss, cough, dyspnea, blood in stool or urine or other localizing symptoms.      Relevant past medical, surgical, family and social history reviewed and updated as indicated. Interim medical history since our last visit reviewed. Allergies and medications reviewed and updated. Outpatient Medications Prior to Visit  Medication Sig Dispense Refill  . Cholecalciferol (VITAMIN D) 2000 units tablet Take 2,000 Units by mouth daily.      . ferrous sulfate 325 (65 FE) MG tablet Take 1 tablet (325 mg total) by mouth daily as needed (fatigue).  3  . finasteride (PROSCAR) 5 MG tablet TAKE 1 TABLET BY MOUTH  DAILY 90 tablet 1  . HUMALOG KWIKPEN 100 UNIT/ML KwikPen INJECT 3 TIMES A DAY (JUST BEFORE EACH MEAL), 14-6-14 UNITS, AND PEN NEEDLES 4/DAY 6 pen 0  . Insulin Glargine (BASAGLAR KWIKPEN) 100 UNIT/ML SOPN INJECT 60 UNITS TOTAL INTO THE SKIN EVERY MORNING. 15 pen 0  . insulin lispro (HUMALOG KWIKPEN) 100 UNIT/ML KiwkPen Inject into skin 14 units in the morning, 6 units at lunch, & 14 units at night. 20 pen 3  . Insulin Pen Needle (B-D ULTRAFINE III SHORT PEN) 31G X 8 MM MISC USE AS DIRECTED DAILY 100 each 0  . losartan (COZAAR) 25 MG tablet Take 1 tablet (25 mg total) by mouth daily. 90 tablet 3  . metFORMIN (GLUCOPHAGE) 1000 MG tablet TAKE 1 TABLET BY MOUTH TWO  TIMES DAILY 180 tablet 1  . Multiple Vitamin (MULTIVITAMIN WITH MINERALS) TABS tablet Take 1 tablet by mouth daily.    [name] omeprazole (PRILOSEC) 20 MG capsule Take 1 capsule (20 mg total) by mouth daily. 30 capsule 3  . probenecid (BENEMID) 500 MG tablet TAKE ONE-HALF TABLET BY  MOUTH DAILY 45 tablet 1  . rosuvastatin (CRESTOR) 40 MG tablet Take 1 tablet (40 mg total) by mouth daily. <PLEASE MAKE APPOINTMENT FOR REFILLS> 90 tablet 0  .  tamsulosin (FLOMAX) 0.4 MG CAPS capsule TAKE 1 CAPSULE BY MOUTH  DAILY 90 capsule 1  . vitamin C (ASCORBIC ACID) 500 MG tablet Take 500 mg by mouth every evening.     [name] glucose blood (ONE TOUCH ULTRA TEST) test strip 1 each by Other route See admin instructions. Check blood sugar 3 times weekly.    . Rivaroxaban 15 & 20 MG TBPK Take as directed on package: Start with one 15mg  tablet by mouth twice a day with food. On Day 22, switch to one 20mg  tablet once a day with food. 51 each 0  . metoprolol tartrate (LOPRESSOR) 25 MG tablet TAKE 0.5 TABLETS (12.5 MG TOTAL) BY MOUTH 2 (TWO) TIMES DAILY. 30 tablet 7  . apixaban (ELIQUIS) 5 MG TABS tablet Take  1 tablet (5 mg total) by mouth 2 (two) times daily. First week take 10mg  twice daily 80 tablet 0   No facility-administered medications prior to visit.      Per HPI unless specifically indicated in ROS section below Review of Systems Objective:    BP 124/66 (BP Location: Left Arm, Patient Position: Sitting, Cuff Size: Normal)   Pulse 93   Temp 97.8 F (36.6 C) (Oral)   Ht 5' 6.5" (1.689 m)   Wt 205 lb 8 oz (93.2 kg)   SpO2 97%   BMI 32.67 kg/m   Wt Readings from Last 3 Encounters:  05/25/18 205 lb 8 oz (93.2 kg)  04/29/18 209 lb 8 oz (95 kg)  10/18/17 202 lb 12 oz (92 kg)    Physical Exam Vitals signs and nursing note reviewed.  Constitutional:      Appearance: Normal appearance.  Musculoskeletal: Normal range of motion.        General: Swelling present.     Right lower leg: No edema.     Left lower leg: No edema.     Comments: 2+ DP bilaterally Mild discomfort to palpation at left calf  Neurological:     Mental Status: He is alert.       Results for orders placed or performed in visit on 04/29/18  POCT glycosylated hemoglobin (Hb A1C)  Result Value Ref Range   Hemoglobin A1C 13.3 (A) 4.0 - 5.6 %   HbA1c POC (<> result, manual entry)     HbA1c, POC (prediabetic range)     HbA1c, POC (controlled diabetic range)     Lab Results  Component Value Date   PSA 0.53 10/11/2017   PSA 0.87 10/16/2016   PSA 0.86 05/27/2015    Assessment & Plan:   Problem List Items Addressed This Visit    Type 2 diabetes, uncontrolled, with neuropathy ([place])    Pt endorses marked improvement in glycemic control based on daily cbg's at home since he's become more compliant with diabetic regimen and diabetic diet. Congratulated. Did encourage endo f/u regardless - has this scheduled next month.       DVT of deep femoral vein, left ([facility]) - Primary    1st unprovoked DVT. Extensive by venous doppler 04/2018. Tolerating xarelto, will need at least 3 months of treatment, with strong  consideration for lifelong anticoagulation as long as tolerated. Did offer heme eval for recommendations on duration of therapy. Pt agrees.        Relevant Medications   rivaroxaban (XARELTO) 20 MG TABS tablet   Other Relevant Orders   Ambulatory referral to Hematology       Meds ordered this encounter  Medications  . rivaroxaban (XARELTO) 20  MG TABS tablet    Sig: Take 1 tablet (20 mg total) by mouth daily with supper.    Dispense:  30 tablet    Refill:  6  . glucose blood (ONE TOUCH ULTRA TEST) test strip    Sig: Check sugars daily    Dispense:  100 each    Refill:  3   Orders Placed This Encounter  Procedures  . Ambulatory referral to Hematology    Referral Priority:   Routine    Referral Type:   Consultation    Referral Reason:   Specialty Services Required    Requested Specialty:   Oncology    Number of Visits Requested:   1    Follow up plan: Return in about 5 months (around 10/23/2018) for annual exam, prior fasting for blood work.  [name], MD

## 2018-05-30 ENCOUNTER — Encounter: Payer: Self-pay | Admitting: Family Medicine

## 2018-05-30 NOTE — Telephone Encounter (Signed)
Fwd to [employer]

## 2018-05-31 ENCOUNTER — Encounter: Payer: Self-pay | Admitting: Endocrinology

## 2018-05-31 ENCOUNTER — Other Ambulatory Visit: Payer: Self-pay | Admitting: Endocrinology

## 2018-05-31 MED ORDER — INSULIN LISPRO (1 UNIT DIAL) 100 UNIT/ML (KWIKPEN): PEN_INJECTOR | SUBCUTANEOUS | 0 refills | Status: DC

## 2018-05-31 NOTE — Telephone Encounter (Signed)
Please review pt issue and provide new orders

## 2018-06-01 ENCOUNTER — Other Ambulatory Visit: Payer: Self-pay

## 2018-06-02 ENCOUNTER — Encounter: Payer: Self-pay | Admitting: Family Medicine

## 2018-06-02 MED ORDER — PROBENECID 500 MG PO TABS: 250.0000 mg | ORAL_TABLET | Freq: Every day | ORAL | 1 refills | Status: DC

## 2018-06-02 MED ORDER — ROSUVASTATIN CALCIUM 40 MG PO TABS: 40.0000 mg | ORAL_TABLET | Freq: Every day | ORAL | 0 refills | Status: DC

## 2018-06-02 MED ORDER — TAMSULOSIN HCL 0.4 MG PO CAPS: 0.4000 mg | ORAL_CAPSULE | Freq: Every day | ORAL | 1 refills | Status: DC

## 2018-06-02 MED ORDER — METFORMIN HCL 1000 MG PO TABS: 1000.0000 mg | ORAL_TABLET | Freq: Two times a day (BID) | ORAL | 1 refills | Status: DC

## 2018-06-02 MED ORDER — FINASTERIDE 5 MG PO TABS: 5.0000 mg | ORAL_TABLET | Freq: Every day | ORAL | 1 refills | Status: DC

## 2018-06-02 NOTE — Telephone Encounter (Signed)
E-scribed refills. Notified pt via MyChart.  

## 2018-06-09 ENCOUNTER — Encounter: Payer: Self-pay | Admitting: Endocrinology

## 2018-06-09 ENCOUNTER — Ambulatory Visit (INDEPENDENT_AMBULATORY_CARE_PROVIDER_SITE_OTHER): Payer: 59 | Admitting: Endocrinology

## 2018-06-09 ENCOUNTER — Other Ambulatory Visit: Payer: Self-pay

## 2018-06-09 VITALS — BP 120/78 | HR 75 | Ht 66.5 in | Wt 208.8 lb

## 2018-06-09 DIAGNOSIS — E1140 Type 2 diabetes mellitus with diabetic neuropathy, unspecified: Secondary | ICD-10-CM

## 2018-06-09 DIAGNOSIS — E1165 Type 2 diabetes mellitus with hyperglycemia: Secondary | ICD-10-CM | POA: Diagnosis not present

## 2018-06-09 DIAGNOSIS — IMO0002 Reserved for concepts with insufficient information to code with codable children: Secondary | ICD-10-CM

## 2018-06-09 MED ORDER — BASAGLAR KWIKPEN 100 UNIT/ML ~~LOC~~ SOPN: 75.0000 [IU] | PEN_INJECTOR | Freq: Every day | SUBCUTANEOUS | 11 refills | Status: DC

## 2018-06-09 MED ORDER — INSULIN LISPRO (1 UNIT DIAL) 100 UNIT/ML (KWIKPEN): PEN_INJECTOR | SUBCUTANEOUS | 11 refills | Status: DC

## 2018-06-09 MED ORDER — INSULIN PEN NEEDLE 31G X 8 MM MISC: 1.0000 | Freq: Four times a day (QID) | 11 refills | Status: AC

## 2018-06-09 NOTE — Patient Instructions (Addendum)
Please continue the same insulins for now. lantus and toujeo are similar to basaglar. novolog is similar to humalog.  We can change if it is cheaper.  A different type of diabetes blood test is requested for you today.  We'll let you know about the results.  check your blood sugar twice a day.  vary the time of day when you check, between before the 3 meals, and at bedtime.  also check if you have symptoms of your blood sugar being too high or too low.  please keep a record of the readings and bring it to your next appointment here (or you can bring the meter itself).  You can write it on any piece of paper.  please call us sooner if your blood sugar goes below 70, or if you have a lot of readings over 200.   Please come back for a follow-up appointment in 3 months.

## 2018-06-09 NOTE — Progress Notes (Signed)
Subjective:    Patient ID: Alan Morrison, male    DOB: November 16, 1950, 68 y.o.   MRN: [id number]  HPI Pt returns for f/u of diabetes mellitus: DM type: Insulin-requiring type 2.  Dx'ed: 9485 Complications: polyneuropathy and CAD Therapy: insulin since early 2018, and metformin.   DKA: never Severe hypoglycemia: never Pancreatitis: never Pancreatic imaging: never Other: he takes multiple daily injections Interval history: He brings a record of his fasting cbg's which I have reviewed today.  cbg's vary from 83-278, but most are in the 100's.  All are checked fasting.  pt states he feels well in general.  he takes Humalog 3 times a day (just before each meal), 25-15-25 units, and Baslagar, 75 units qhs.  Past Medical History:  Diagnosis Date  . Anemia, iron deficiency, inadequate dietary intake   . 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   by [place]  . CAD (coronary artery disease) 2003   cath - Min Dz, EF 65%, Adm.R/O'd  . Cervical spondylosis without myelopathy   . Chest pain, atypical   . Choroidal nevus 01/22/2011   left, yearly eye exam, no diabetic retinopathy  . Diabetes mellitus type II   . Dyspepsia   . Ex-smoker   . Fatty liver 02/2016   by [place]  . GERD (gastroesophageal reflux disease)   . Headache(784.0)   . Heart murmur    Hx of  . History of MRI of brain and brain stem 09/06  . History of MRSA infection 12/2013   R knee boil  . HLD (hyperlipidemia)   . Hyperuricemia   . Obesity   . Sleep apnea   . Stress-induced cardiomyopathy 09/27/98   WNL, EF 64%  . Urosepsis 01/01-01/05/10   Hospitalization    Past Surgical History:  Procedure Laterality Date  . COLONOSCOPY  07/2013   1 benign polyp rpt 10 yrs [name])  . KNEE ARTHROSCOPY Left 1988  . KNEE SURGERY Right 05/21/03   Right, Dr. Marlou Sa, med meniscus tear via MRI  . LEFT HEART CATH AND CORONARY ANGIOGRAPHY N/A 04/01/2017   Procedure: LEFT HEART CATH AND CORONARY ANGIOGRAPHY;  Surgeon: Martinique, Peter M, MD;   Location: [facility];  Service: Cardiovascular;  Laterality: N/A;  . MYELOGRAM  08/05   bulging disc    Social History   Socioeconomic History  . Marital status: Married    Spouse name: Not on file  . Number of children: 2  . Years of education: Not on file  . Highest education level: Not on file  Occupational History  . Occupation: Maintenance    Employer: [employer]  Social Needs  . Financial resource strain: Not on file  . Food insecurity:    Worry: Not on file    Inability: Not on file  . Transportation needs:    Medical: Not on file    Non-medical: Not on file  Tobacco Use  . Smoking status: Former Smoker    Last attempt to quit: 04/20/1974    Years since quitting: 44.1  . Smokeless tobacco: Never Used  . Tobacco comment: quit over 20 years  Substance and Sexual Activity  . Alcohol use: Yes    Alcohol/week: 3.0 standard drinks    Types: 3 Cans of beer per week    Comment: occasional  . Drug use: No  . Sexual activity: Not on file  Lifestyle  . Physical activity:    Days per week: Not on file    Minutes per session: Not  on file  . Stress: Not on file  Relationships  . Social connections:    Talks on phone: Not on file    Gets together: Not on file    Attends religious service: Not on file    Active member of club or organization: Not on file    Attends meetings of clubs or organizations: Not on file    Relationship status: Not on file  . Intimate partner violence:    Fear of current or ex partner: Not on file    Emotionally abused: Not on file    Physically abused: Not on file    Forced sexual activity: Not on file  Other Topics Concern  . Not on file  Social History Narrative   Lives with wife   Occupation: equipment maintenance   Activity: no regular exercise - hunts   Diet: good water, fruits/vegetables    Current Outpatient Medications on File Prior to Visit  Medication Sig Dispense Refill  . Cholecalciferol (VITAMIN D) 2000  units tablet Take 2,000 Units by mouth daily.    . ferrous sulfate 325 (65 FE) MG tablet Take 1 tablet (325 mg total) by mouth daily as needed (fatigue).  3  . finasteride (PROSCAR) 5 MG tablet Take 1 tablet (5 mg total) by mouth daily. 90 tablet 1  . glucose blood (ONE TOUCH ULTRA TEST) test strip Check sugars daily 100 each 3  . losartan (COZAAR) 25 MG tablet Take 1 tablet (25 mg total) by mouth daily. 90 tablet 3  . metFORMIN (GLUCOPHAGE) 1000 MG tablet Take 1 tablet (1,000 mg total) by mouth 2 (two) times daily. 180 tablet 1  . Multiple Vitamin (MULTIVITAMIN WITH MINERALS) TABS tablet Take 1 tablet by mouth daily.    [name] omeprazole (PRILOSEC) 20 MG capsule Take 1 capsule (20 mg total) by mouth daily. 30 capsule 3  . probenecid (BENEMID) 500 MG tablet Take 0.5 tablets (250 mg total) by mouth daily. 45 tablet 1  . rivaroxaban (XARELTO) 20 MG TABS tablet Take 1 tablet (20 mg total) by mouth daily with supper. 30 tablet 6  . rosuvastatin (CRESTOR) 40 MG tablet Take 1 tablet (40 mg total) by mouth daily. <PLEASE MAKE APPOINTMENT FOR REFILLS> 90 tablet 0  . tamsulosin (FLOMAX) 0.4 MG CAPS capsule Take 1 capsule (0.4 mg total) by mouth daily. 90 capsule 1  . vitamin C (ASCORBIC ACID) 500 MG tablet Take 500 mg by mouth every evening.     . metoprolol tartrate (LOPRESSOR) 25 MG tablet TAKE 0.5 TABLETS (12.5 MG TOTAL) BY MOUTH 2 (TWO) TIMES DAILY. 30 tablet 7   No current facility-administered medications on file prior to visit.     Allergies  Allergen Reactions  . Victoza [Liraglutide] Nausea Only    Irritation around injection site    Family History  Problem Relation Age of Onset  . Heart disease Mother        CHF  . Diabetes Mother   . Heart disease Father        CHF  . Hypertension Father   . Migraines Brother        severe headaches from arsenic in the past from  wood that was treated on his deck  . CAD Brother 55       stent  . Cancer Maternal Uncle        unsure  . Stroke Neg Hx    . Colon cancer Neg Hx     BP 120/78 (BP Location: Right Arm,  Patient Position: Sitting, Cuff Size: Normal)   Pulse 75   Ht 5' 6.5" (1.689 m)   Wt 208 lb 12.8 oz (94.7 kg)   SpO2 92%   BMI 33.20 kg/m     Review of Systems He denies hypoglycemia.      Objective:   Physical Exam VITAL SIGNS:  See vs page GENERAL: no distress Pulses: dorsalis pedis intact bilat.   MSK: no deformity of the feet CV: trace bilat leg edema Skin:  no ulcer on the feet.  normal color and temp on the feet. Neuro: sensation is intact to touch on the feet Ext: There is bilateral onychomycosis of the toenails.      Lab Results  Component Value Date   HGBA1C 13.3 (A) 04/29/2018       Assessment & Plan:  Insulin-requiring type 2 DM, with CAD: glycemic control is improved, but we need fructosamine and more cbg's later in the day, to be sure.    Patient Instructions  Please continue the same insulins for now. lantus and toujeo are similar to basaglar. novolog is similar to humalog.  We can change if it is cheaper.  A different type of diabetes blood test is requested for you today.  We'll let you know about the results.  check your blood sugar twice a day.  vary the time of day when you check, between before the 3 meals, and at bedtime.  also check if you have symptoms of your blood sugar being too high or too low.  please keep a record of the readings and bring it to your next appointment here (or you can bring the meter itself).  You can write it on any piece of paper.  please call us sooner if your blood sugar goes below 70, or if you have a lot of readings over 200.   Please come back for a follow-up appointment in 3 months.

## 2018-06-12 LAB — FRUCTOSAMINE: Fructosamine: 326 umol/L — ABNORMAL HIGH (ref 205–285)

## 2018-06-25 ENCOUNTER — Other Ambulatory Visit: Payer: Self-pay

## 2018-06-27 ENCOUNTER — Other Ambulatory Visit: Payer: Self-pay | Admitting: *Deleted

## 2018-06-27 ENCOUNTER — Telehealth: Payer: Self-pay | Admitting: Oncology

## 2018-06-27 DIAGNOSIS — D508 Other iron deficiency anemias: Secondary | ICD-10-CM

## 2018-06-27 DIAGNOSIS — Z0000 Encounter for general adult medical examination without abnormal findings: Secondary | ICD-10-CM

## 2018-06-27 DIAGNOSIS — D751 Secondary polycythemia: Secondary | ICD-10-CM

## 2018-06-27 DIAGNOSIS — I82412 Acute embolism and thrombosis of left femoral vein: Secondary | ICD-10-CM

## 2018-06-27 DIAGNOSIS — R5382 Chronic fatigue, unspecified: Secondary | ICD-10-CM

## 2018-06-27 MED ORDER — METOPROLOL TARTRATE 25 MG PO TABS: 12.5000 mg | ORAL_TABLET | Freq: Two times a day (BID) | ORAL | 11 refills | Status: DC

## 2018-06-27 MED ORDER — ROSUVASTATIN CALCIUM 40 MG PO TABS: 40.0000 mg | ORAL_TABLET | Freq: Every day | ORAL | 0 refills | Status: DC

## 2018-06-27 NOTE — Telephone Encounter (Signed)
A new hem appt has been scheduled for the pt to see Dr. Jana Hakim on 3/10 at 930am w/labs at 9am. Pt aware to arrive 15 minutes early.

## 2018-06-28 ENCOUNTER — Telehealth: Payer: Self-pay | Admitting: Oncology

## 2018-06-28 ENCOUNTER — Inpatient Hospital Stay: Payer: 59 | Attending: Oncology | Admitting: Oncology

## 2018-06-28 ENCOUNTER — Inpatient Hospital Stay: Payer: 59

## 2018-06-28 VITALS — BP 117/63 | HR 79 | Temp 97.8°F | Resp 18 | Ht 66.5 in | Wt 210.1 lb

## 2018-06-28 DIAGNOSIS — Z6833 Body mass index (BMI) 33.0-33.9, adult: Secondary | ICD-10-CM | POA: Insufficient documentation

## 2018-06-28 DIAGNOSIS — I82412 Acute embolism and thrombosis of left femoral vein: Secondary | ICD-10-CM | POA: Insufficient documentation

## 2018-06-28 DIAGNOSIS — E1165 Type 2 diabetes mellitus with hyperglycemia: Secondary | ICD-10-CM

## 2018-06-28 DIAGNOSIS — I2510 Atherosclerotic heart disease of native coronary artery without angina pectoris: Secondary | ICD-10-CM | POA: Diagnosis not present

## 2018-06-28 DIAGNOSIS — D508 Other iron deficiency anemias: Secondary | ICD-10-CM

## 2018-06-28 DIAGNOSIS — I10 Essential (primary) hypertension: Secondary | ICD-10-CM | POA: Diagnosis not present

## 2018-06-28 DIAGNOSIS — Z79899 Other long term (current) drug therapy: Secondary | ICD-10-CM | POA: Diagnosis not present

## 2018-06-28 DIAGNOSIS — E1140 Type 2 diabetes mellitus with diabetic neuropathy, unspecified: Secondary | ICD-10-CM

## 2018-06-28 DIAGNOSIS — D751 Secondary polycythemia: Secondary | ICD-10-CM

## 2018-06-28 DIAGNOSIS — K760 Fatty (change of) liver, not elsewhere classified: Secondary | ICD-10-CM | POA: Diagnosis not present

## 2018-06-28 DIAGNOSIS — Z723 Lack of physical exercise: Secondary | ICD-10-CM

## 2018-06-28 DIAGNOSIS — Z7901 Long term (current) use of anticoagulants: Secondary | ICD-10-CM | POA: Diagnosis not present

## 2018-06-28 DIAGNOSIS — IMO0002 Reserved for concepts with insufficient information to code with codable children: Secondary | ICD-10-CM

## 2018-06-28 DIAGNOSIS — Z87891 Personal history of nicotine dependence: Secondary | ICD-10-CM | POA: Diagnosis not present

## 2018-06-28 DIAGNOSIS — E785 Hyperlipidemia, unspecified: Secondary | ICD-10-CM | POA: Diagnosis not present

## 2018-06-28 DIAGNOSIS — E669 Obesity, unspecified: Secondary | ICD-10-CM | POA: Insufficient documentation

## 2018-06-28 DIAGNOSIS — E790 Hyperuricemia without signs of inflammatory arthritis and tophaceous disease: Secondary | ICD-10-CM | POA: Diagnosis not present

## 2018-06-28 DIAGNOSIS — Z794 Long term (current) use of insulin: Secondary | ICD-10-CM | POA: Insufficient documentation

## 2018-06-28 DIAGNOSIS — R011 Cardiac murmur, unspecified: Secondary | ICD-10-CM | POA: Insufficient documentation

## 2018-06-28 DIAGNOSIS — K219 Gastro-esophageal reflux disease without esophagitis: Secondary | ICD-10-CM | POA: Insufficient documentation

## 2018-06-28 DIAGNOSIS — D689 Coagulation defect, unspecified: Secondary | ICD-10-CM | POA: Diagnosis not present

## 2018-06-28 DIAGNOSIS — Z0000 Encounter for general adult medical examination without abnormal findings: Secondary | ICD-10-CM

## 2018-06-28 DIAGNOSIS — G4730 Sleep apnea, unspecified: Secondary | ICD-10-CM | POA: Insufficient documentation

## 2018-06-28 DIAGNOSIS — R5382 Chronic fatigue, unspecified: Secondary | ICD-10-CM

## 2018-06-28 LAB — CBC WITH DIFFERENTIAL (CANCER CENTER ONLY)
Abs Immature Granulocytes: 0.01 10*3/uL (ref 0.00–0.07)
Basophils Absolute: 0 10*3/uL (ref 0.0–0.1)
Basophils Relative: 1 %
Eosinophils Absolute: 0.3 10*3/uL (ref 0.0–0.5)
Eosinophils Relative: 3 %
HCT: 44 % (ref 39.0–52.0)
Hemoglobin: 14.6 g/dL (ref 13.0–17.0)
Immature Granulocytes: 0 %
Lymphocytes Relative: 18 %
Lymphs Abs: 1.3 10*3/uL (ref 0.7–4.0)
MCH: 29.9 pg (ref 26.0–34.0)
MCHC: 33.2 g/dL (ref 30.0–36.0)
MCV: 90 fL (ref 80.0–100.0)
Monocytes Absolute: 0.5 10*3/uL (ref 0.1–1.0)
Monocytes Relative: 7 %
Neutro Abs: 5.4 10*3/uL (ref 1.7–7.7)
Neutrophils Relative %: 71 %
Platelet Count: 200 10*3/uL (ref 150–400)
RBC: 4.89 MIL/uL (ref 4.22–5.81)
RDW: 13.2 % (ref 11.5–15.5)
WBC: 7.6 10*3/uL (ref 4.0–10.5)
nRBC: 0 % (ref 0.0–0.2)

## 2018-06-28 LAB — CMP (CANCER CENTER ONLY)
ALK PHOS: 48 U/L (ref 38–126)
ALT: 34 U/L (ref 0–44)
ANION GAP: 11 (ref 5–15)
AST: 31 U/L (ref 15–41)
Albumin: 3.8 g/dL (ref 3.5–5.0)
BUN: 29 mg/dL — ABNORMAL HIGH (ref 8–23)
CALCIUM: 9.7 mg/dL (ref 8.9–10.3)
CO2: 24 mmol/L (ref 22–32)
Chloride: 106 mmol/L (ref 98–111)
Creatinine: 1.2 mg/dL (ref 0.61–1.24)
GFR, Est AFR Am: >60 mL/min (ref >60)
GFR, Estimated: >60 mL/min (ref >60)
Glucose, Bld: 119 mg/dL — ABNORMAL HIGH (ref 70–99)
Potassium: 4.6 mmol/L (ref 3.5–5.1)
Sodium: 141 mmol/L (ref 135–145)
Total Bilirubin: 0.6 mg/dL (ref 0.3–1.2)
Total Protein: 7.2 g/dL (ref 6.5–8.1)

## 2018-06-28 LAB — D-DIMER, QUANTITATIVE: D-Dimer, Quant: 0.28 ug/mL-FEU (ref 0.00–0.50)

## 2018-06-28 LAB — SAVE SMEAR(SSMR), FOR PROVIDER SLIDE REVIEW

## 2018-06-28 LAB — CEA (IN HOUSE-CHCC): CEA (CHCC-In House): 1.11 ng/mL (ref 0.00–5.00)

## 2018-06-28 LAB — TECHNOLOGIST SMEAR REVIEW

## 2018-06-28 NOTE — Telephone Encounter (Signed)
Patient needs early time due to insurance coverage

## 2018-06-28 NOTE — Telephone Encounter (Signed)
Tried to reach regarding 4/28 regarding doppler

## 2018-06-28 NOTE — Progress Notes (Signed)
Alan Morrison Morrison  [phone] Fax:([phone]) [phone]    ID: Alan Morrison Morrison DOB: 01-27-1951  MR#: [id number]  MLY#:[id number]  Patient Care Team: Alan Bush, MD as PCP - General (Family Medicine) Morrison, Alan Morrison M, MD as Consulting Physician (Cardiology) Alan Morrison Shin, MD as Consulting Physician (Endocrinology) Alan Morrison Morrison, Alan Morrison Dad, MD as Consulting Physician (Hematology and Oncology) OTHER MD:   CHIEF COMPLAINT: Coagulopathy  CURRENT TREATMENT: Rivaroxaban   HISTORY OF CURRENT ILLNESS: "Alan Morrison" Juanjesus Morrison was referred by Dr. Ria Morrison for evaluation and treatment of deep vein thrombosis (DVT) of the left deep femoral vein . This originally presented as a possible Baker's cyst as noted at the 04/29/2018 appointment with Dr. Danise Morrison, and a STAT Doppler ultrasound was performed on the same day, showing a clot involving the left proximal femoral vein.  Prior to the DVT, Alan Morrison did not have a change in medication, he had not been on a trip, and he did not experience any local trauma. He states that his left leg was extremely swollen and hard as a rock at the time of presentation.  This had been going on several weeks before he brought it to medical attention.  The patient's subsequent history is as detailed below.   INTERVAL HISTORY: Alan Morrison Morrison was evaluated in the hematology clinic on 06/28/2018 accompanied by his wife, Alan Morrison Morrison.    REVIEW OF SYSTEMS: Alan Morrison Morrison notes that his calf is still sore. Alan Morrison Morrison does not have a formal exercise routine, he walks a little at work, but he says that he is not able to get around much because of his knees.  When he retires he is planning to join his wife at [employer].  The patient denies unusual headaches, visual changes, nausea, vomiting, stiff neck, dizziness, or gait imbalance. There has been no cough, phlegm production, or pleurisy, no chest pain or pressure, and no change in bowel or bladder habits. The patient denies fever,  rash, bleeding or bruising, unexplained fatigue or unexplained weight loss. A detailed review of systems was otherwise entirely negative.   PAST MEDICAL HISTORY: Past Medical History:  Diagnosis Date  . Anemia, iron deficiency, inadequate dietary intake   . 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   by [place]  . CAD (coronary artery disease) 2003   cath - Min Dz, EF 65%, Adm.R/O'd  . Cervical spondylosis without myelopathy   . Chest pain, atypical   . Choroidal nevus 01/22/2011   left, yearly eye exam, no diabetic retinopathy  . Diabetes mellitus type II   . Dyspepsia   . Ex-smoker   . Fatty liver 02/2016   by [place]  . GERD (gastroesophageal reflux disease)   . Headache(784.0)   . Heart murmur    Hx of  . History of MRI of brain and brain stem 09/06  . History of MRSA infection 12/2013   R knee boil  . HLD (hyperlipidemia)   . Hyperuricemia   . Obesity   . Sleep apnea   . Stress-induced cardiomyopathy 09/27/98   WNL, EF 64%  . Urosepsis 01/01-01/05/10   Hospitalization     PAST SURGICAL HISTORY: Past Surgical History:  Procedure Laterality Date  . COLONOSCOPY  07/2013   1 benign polyp rpt 10 yrs [name])  . KNEE ARTHROSCOPY Left 1988  . KNEE SURGERY Right 05/21/03   Right, Dr. Marlou Sa, med meniscus tear via MRI  . LEFT HEART CATH AND CORONARY ANGIOGRAPHY N/A 04/01/2017   Procedure: LEFT HEART CATH AND CORONARY ANGIOGRAPHY;  Surgeon: Morrison, Alan Morrison M, MD;  Location: [facility];  Service: Cardiovascular;  Laterality: N/A;  . MYELOGRAM  08/05   bulging disc     FAMILY HISTORY: Family History  Problem Relation Age of Onset  . Heart disease Mother        CHF  . Diabetes Mother   . Heart disease Father        CHF  . Hypertension Father   . Migraines Brother        severe headaches from arsenic in the past from  wood that was treated on his deck  . CAD Brother 10       stent  . Cancer Maternal Uncle        unsure  . Stroke Neg Hx   . Colon cancer Neg Hx     Alan Morrison's father died from heart disease at age 67. Patients' mother died from heart disease at age 82; she had a history of a leg clot at age 47. The patient has 2 brothers and 2 sisters. Patient denies anyone in her family having breast, ovarian, prostate, or pancreatic cancer. Alan Morrison's eldest sister had a myocardial infarction. His youngest brother had a stent placed.    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 His wife, Alan Morrison Morrison, retired from the Audiological scientist in 11/2017. Their children, Anderson Malta and Levada Dy, work at [employer] respectively. Alan Morrison Morrison attends the [employer]   ADVANCED DIRECTIVES: Alan Morrison's wife, Alan Morrison Morrison, is automatically his healthcare power of attorney.     HEALTH MAINTENANCE: Social History   Tobacco Use  . Smoking status: Former Smoker    Last attempt to quit: 04/20/1974    Years since quitting: 44.2  . Smokeless tobacco: Never Used  . Tobacco comment: quit over 20 years  Substance Use Topics  . Alcohol use: Yes    Alcohol/week: 3.0 standard drinks    Types: 3 Cans of beer per week    Comment: occasional  . Drug use: No    Colonoscopy:   Bone density:    Allergies  Allergen Reactions  . Victoza [Liraglutide] Nausea Only    Irritation around injection site    Current Outpatient Medications  Medication Sig Dispense Refill  . Cholecalciferol (VITAMIN D) 2000 units tablet Take 2,000 Units by mouth daily.    . ferrous sulfate 325 (65 FE) MG tablet Take 1 tablet (325 mg total) by mouth daily as needed (fatigue).  3  . finasteride (PROSCAR) 5 MG tablet Take 1 tablet (5 mg total) by mouth daily. 90 tablet 1  . glucose blood (ONE TOUCH ULTRA TEST) test strip Check sugars daily 100 each 3  . Insulin Glargine (BASAGLAR KWIKPEN) 100 UNIT/ML SOPN Inject 0.75 mLs (75 Units total) into the skin at bedtime. 10 pen 11  . insulin lispro (HUMALOG KWIKPEN) 100  UNIT/ML KwikPen 3 times a day (just before each meal) 25-15-25 units. 10 pen 11  . Insulin Pen Needle (B-D ULTRAFINE III SHORT PEN) 31G X 8 MM MISC 1 Device by Other route 4 (four) times daily. USE AS DIRECTED DAILY 120 each 11  . losartan (COZAAR) 25 MG tablet Take 1 tablet (25 mg total) by mouth daily. 90 tablet 3  . metFORMIN (GLUCOPHAGE) 1000 MG tablet Take 1 tablet (1,000 mg total) by mouth 2 (two) times daily. 180 tablet 1  . metoprolol tartrate (LOPRESSOR) 25 MG tablet Take 0.5 tablets (12.5 mg total) by mouth 2 (two) times  daily. 30 tablet 11  . Multiple Vitamin (MULTIVITAMIN WITH MINERALS) TABS tablet Take 1 tablet by mouth daily.    . probenecid (BENEMID) 500 MG tablet Take 0.5 tablets (250 mg total) by mouth daily. 45 tablet 1  . rivaroxaban (XARELTO) 20 MG TABS tablet Take 1 tablet (20 mg total) by mouth daily with supper. 30 tablet 6  . rosuvastatin (CRESTOR) 40 MG tablet Take 1 tablet (40 mg total) by mouth daily. <PLEASE MAKE APPOINTMENT FOR REFILLS> 90 tablet 0  . tamsulosin (FLOMAX) 0.4 MG CAPS capsule Take 1 capsule (0.4 mg total) by mouth daily. 90 capsule 1  . vitamin C (ASCORBIC ACID) 500 MG tablet Take 500 mg by mouth every evening.     [name] omeprazole (PRILOSEC) 20 MG capsule Take 1 capsule (20 mg total) by mouth daily. (Patient not taking: Reported on 06/28/2018) 30 capsule 3   No current facility-administered medications for this visit.      OBJECTIVE: Middle-aged white man in no acute distress  Vitals:   06/28/18 0911  BP: 117/63  Pulse: 79  Resp: 18  Temp: 97.8 F (36.6 C)  SpO2: 98%     Body mass index is 33.4 kg/m.   Wt Readings from Last 3 Encounters:  06/28/18 210 lb 1.6 oz (95.3 kg)  06/09/18 208 lb 12.8 oz (94.7 kg)  05/25/18 205 lb 8 oz (93.2 kg)      ECOG FS:1 - Symptomatic but completely ambulatory  Ocular: Sclerae unicteric, pupils round and equal Lymphatic: No cervical or supraclavicular adenopathy Lungs no rales or rhonchi Heart regular rate  and rhythm Abd soft, nontender, positive bowel sounds MSK no focal spinal tenderness, no joint edema Neuro: non-focal, well-oriented, appropriate affect    LAB RESULTS:  CMP     Component Value Date/Time   NA 141 06/28/2018 0849   NA 138 03/25/2017 1225   K 4.6 06/28/2018 0849   CL 106 06/28/2018 0849   CO2 24 06/28/2018 0849   GLUCOSE 119 (H) 06/28/2018 0849   BUN 29 (H) 06/28/2018 0849   BUN 29 (H) 03/25/2017 1225   CREATININE 1.20 06/28/2018 0849   CALCIUM 9.7 06/28/2018 0849   PROT 7.2 06/28/2018 0849   PROT 7.1 06/21/2017 0804   ALBUMIN 3.8 06/28/2018 0849   ALBUMIN 4.4 06/21/2017 0804   AST 31 06/28/2018 0849   ALT 34 06/28/2018 0849   ALKPHOS 48 06/28/2018 0849   BILITOT 0.6 06/28/2018 0849   GFRNONAA >60 06/28/2018 0849   GFRAA >60 06/28/2018 0849    No results found for: TOTALPROTELP, ALBUMINELP, A1GS, A2GS, BETS, BETA2SER, GAMS, MSPIKE, SPEI  No results found for: KPAFRELGTCHN, LAMBDASER, KAPLAMBRATIO  Lab Results  Component Value Date   WBC 7.6 06/28/2018   NEUTROABS 5.4 06/28/2018   HGB 14.6 06/28/2018   HCT 44.0 06/28/2018   MCV 90.0 06/28/2018   PLT 200 06/28/2018    @LASTCHEMISTRY @  No results found for: LABCA2  No components found for: YHCWCB762  No results for input(s): INR in the last 168 hours.  No results found for: LABCA2  No results found for: CAN199  No results found for: GBT517  No results found for: OHY073  No results found for: CA2729  No components found for: HGQUANT  Lab Results  Component Value Date   CEA1 1.11 06/28/2018   /  CEA ([facility])  Date Value Ref Range Status  06/28/2018 1.11 0.00 - 5.00 ng/mL Final    Comment:    (NOTE) This test was performed  using Architect's Chemiluminescent Microparticle Immunoassay. Values obtained from different assay methods cannot be used interchangeably. Please note that 5-10% of patients who smoke may see CEA levels up to 6.9 ng/mL. Performed at [facility], [place] [address], [place], [place] [address]      No results found for: AFPTUMOR  No results found for: [place]  No results found for: PSA1  Appointment on 06/28/2018  Component Date Value Ref Range Status  . WBC Count 06/28/2018 7.6  4.0 - 10.5 K/uL Final  . RBC 06/28/2018 4.89  4.22 - 5.81 MIL/uL Final  . Hemoglobin 06/28/2018 14.6  13.0 - 17.0 g/dL Final  . HCT 06/28/2018 44.0  39.0 - 52.0 % Final  . MCV 06/28/2018 90.0  80.0 - 100.0 fL Final  . MCH 06/28/2018 29.9  26.0 - 34.0 pg Final  . MCHC 06/28/2018 33.2  30.0 - 36.0 g/dL Final  . RDW 06/28/2018 13.2  11.5 - 15.5 % Final  . Platelet Count 06/28/2018 200  150 - 400 K/uL Final  . nRBC 06/28/2018 0.0  0.0 - 0.2 % Final  . Neutrophils Relative % 06/28/2018 71  % Final  . Neutro Abs 06/28/2018 5.4  1.7 - 7.7 K/uL Final  . Lymphocytes Relative 06/28/2018 18  % Final  . Lymphs Abs 06/28/2018 1.3  0.7 - 4.0 K/uL Final  . Monocytes Relative 06/28/2018 7  % Final  . Monocytes Absolute 06/28/2018 0.5  0.1 - 1.0 K/uL Final  . Eosinophils Relative 06/28/2018 3  % Final  . Eosinophils Absolute 06/28/2018 0.3  0.0 - 0.5 K/uL Final  . Basophils Relative 06/28/2018 1  % Final  . Basophils Absolute 06/28/2018 0.0  0.0 - 0.1 K/uL Final  . Immature Granulocytes 06/28/2018 0  % Final  . Abs Immature Granulocytes 06/28/2018 0.01  0.00 - 0.07 K/uL Final   Performed at [facility], [place] [address], [place], [place] [address]  . Sodium 06/28/2018 141  135 - 145 mmol/L Final  . Potassium 06/28/2018 4.6  3.5 - 5.1 mmol/L Final  . Chloride 06/28/2018 106  98 - 111 mmol/L Final  . CO2 06/28/2018 24  22 - 32 mmol/L Final  . Glucose, Bld 06/28/2018 119* 70 - 99 mg/dL Final  . BUN 06/28/2018 29* 8 - 23 mg/dL Final  . Creatinine 06/28/2018 1.20  0.61 - 1.24 mg/dL Final  . Calcium 06/28/2018 9.7  8.9 - 10.3 mg/dL Final  . Total Protein 06/28/2018 7.2  6.5 - 8.1 g/dL Final  . Albumin 06/28/2018 3.8   3.5 - 5.0 g/dL Final  . AST 06/28/2018 31  15 - 41 U/L Final  . ALT 06/28/2018 34  0 - 44 U/L Final  . Alkaline Phosphatase 06/28/2018 48  38 - 126 U/L Final  . Total Bilirubin 06/28/2018 0.6  0.3 - 1.2 mg/dL Final  . GFR, Est Non Af Am 06/28/2018 >60  >60 mL/min Final  . GFR, Est AFR Am 06/28/2018 >60  >60 mL/min Final  . Anion gap 06/28/2018 11  5 - 15 Final   Performed at [facility], [place] [address], [place], [place] [address]  . CEA ([facility]) 06/28/2018 1.11  0.00 - 5.00 ng/mL Final   Comment: (NOTE) This test was performed using Architect's Chemiluminescent Microparticle Immunoassay. Values obtained from different assay methods cannot be used interchangeably. Please note that 5-10% of patients who smoke may see CEA levels up to 6.9 ng/mL. Performed at [facility], [place] [address] [place], [place] [address]   . D-Dimer, Quant 06/28/2018 0.28  0.00 - 0.50 ug/mL-FEU Final   Comment: (NOTE) At the manufacturer cut-off of 0.50 ug/mL FEU, this assay has been documented to exclude PE with a sensitivity and negative predictive value of 97 to 99%.  At this time, this assay has not been approved by the FDA to exclude DVT/VTE. Results should be correlated with clinical presentation. Performed at [facility], [place] [address], [place], [place] [address]   . Tech Review 06/28/2018 rbc nl, wbc nl, plt nl   Final   Performed at [facility], [address] [place], [place] [address]  . Smear Review 06/28/2018 SMEAR STAINED AND AVAILABLE FOR REVIEW   Final   Performed at [facility], [address] [place], [place] [address]    (this displays the last labs from the last 3 days)  No results found for: TOTALPROTELP, ALBUMINELP, A1GS, A2GS, BETS, BETA2SER, GAMS, MSPIKE, SPEI (this displays SPEP labs)  No results found for: KPAFRELGTCHN, LAMBDASER,  KAPLAMBRATIO (kappa/lambda light chains)  No results found for: HGBA, HGBA2QUANT, HGBFQUANT, HGBSQUAN (Hemoglobinopathy evaluation)   No results found for: LDH  Lab Results  Component Value Date   IRON 74 10/11/2017   IRONPCTSAT 19.2 (L) 10/11/2017   (Iron and TIBC)  Lab Results  Component Value Date   FERRITIN 123.7 10/11/2017    Urinalysis    Component Value Date/Time   COLORURINE YELLOW 12/10/2013 [place] 12/10/2013 1103   LABSPEC 1.014 12/10/2013 1103   PHURINE 5.0 12/10/2013 1103   GLUCOSEU NEGATIVE 12/10/2013 1103   HGBUR NEGATIVE 12/10/2013 1103   HGBUR negative 07/29/2009 1549   BILIRUBINUR Neg 10/22/2016 1713   KETONESUR NEGATIVE 12/10/2013 1103   PROTEINUR Neg 10/22/2016 1713   PROTEINUR NEGATIVE 12/10/2013 1103   UROBILINOGEN 0.2 10/22/2016 1713   UROBILINOGEN 0.2 12/10/2013 1103   NITRITE Neg 10/22/2016 1713   NITRITE NEGATIVE 12/10/2013 1103   LEUKOCYTESUR Negative 10/22/2016 1713     STUDIES:  Vascular ultrasound 04/29/2018 showed: Right: There is no evidence of acute deep vein thrombosis in the lower extremity. Left: Positive for left lower extremity DVT from the proximal femoral vein extending through the popliteal vein and into the proximal calf. No compression noted in the distal posterior tibial vein suggestive of DVT.  ELIGIBLE FOR AVAILABLE RESEARCH PROTOCOL: no   ASSESSMENT: 68 y.o. [place], [place] man with a history of left femoral vein thrombosis 04/29/2018 in the setting of metabolic syndrome and sedentary lifestyle    PLAN: I spent approximately 50 minutes face to face with Alan Morrison with more than 50% of that time spent in counseling and coordination of care. Specifically we reviewed the biology of the patient's diagnosis and the specifics of her situation.  He understands that unprovoked blood clots are of special concern because if there is no obvious inciting event or process that could be treated or removed, then it  seems further clots would occur.  For that reason lifetime anticoagulation is sometimes appropriate in these situations.  On the other hand there is a risk of bleeding associated with anticoagulants and the longer the patient is on anticoagulants the more time at risk and therefore the greater possibility of a significant bleed.  While I am not able to find a specific event leading to his left leg clot, he has significant obesity and multiple complications relating to that including hypertension, sleep  apnea, fatty liver, diabetes, and of course orthopedic complications including his nearly disabling knee problems.  It is possible that Alan Morrison Morrison could change his lifestyle.  I suggested a very strict diet with essentially no carbohydrates.  If he follows that he will certainly lose weight, his diabetes, sleep apnea, fatty liver and possibly high blood pressure might resolve, it might be easier to repair his knees, and he might be able to become more active and exercise regularly.  Under those circumstances I think stopping rivaroxaban would be reasonable.  If he finds he really cannot change his lifestyle then we can discuss lifelong rivaroxaban which he is tolerating well so far.  I obtained a d-dimer day which is in the normal range.  I will repeat a Doppler of his left lower extremity before he returns to see me early May.  At that time we should be able to make a definitive decision regarding continuing rivaroxaban.  Alan Morrison Morrison has a good understanding of the overall plan.  He agrees with it. He will call with any problems that may develop before his next visit here.   Alajia Schmelzer, Alan Morrison Dad, MD  06/28/18 5:37 PM Medical Oncology and Hematology [facility] [address] [place], [place] [address] Tel. [phone]    Fax. [phone]    I, Jacqualyn Posey am acting as a Education administrator for Chauncey Cruel, MD.   I, Lurline Del MD, have reviewed the above documentation for accuracy and  completeness, and I agree with the above.

## 2018-06-29 LAB — CANCER ANTIGEN 19-9: CA 19-9: 23 U/mL (ref 0–35)

## 2018-06-29 LAB — PSA, TOTAL AND FREE
PSA FREE: 0.12 ng/mL
PSA, Free Pct: 30 %
Prostate Specific Ag, Serum: 0.4 ng/mL (ref 0.0–4.0)

## 2018-07-07 ENCOUNTER — Ambulatory Visit: Payer: 59 | Admitting: Endocrinology

## 2018-07-19 ENCOUNTER — Encounter: Payer: Self-pay | Admitting: Endocrinology

## 2018-07-19 ENCOUNTER — Encounter: Payer: Self-pay | Admitting: Oncology

## 2018-07-20 ENCOUNTER — Other Ambulatory Visit: Payer: Self-pay | Admitting: Endocrinology

## 2018-07-29 ENCOUNTER — Ambulatory Visit: Payer: 59 | Admitting: Cardiology

## 2018-07-31 ENCOUNTER — Other Ambulatory Visit: Payer: Self-pay | Admitting: Family Medicine

## 2018-08-02 ENCOUNTER — Telehealth: Payer: Self-pay | Admitting: Oncology

## 2018-08-02 NOTE — Telephone Encounter (Signed)
R/s appt per 4/13 sch message - unable to reach patient . Mailed letter and left message with appt date and time

## 2018-08-09 ENCOUNTER — Ambulatory Visit: Payer: 59 | Admitting: Endocrinology

## 2018-08-09 ENCOUNTER — Other Ambulatory Visit: Payer: 59

## 2018-08-10 ENCOUNTER — Encounter: Payer: Self-pay | Admitting: Endocrinology

## 2018-08-10 ENCOUNTER — Ambulatory Visit: Payer: 59 | Admitting: Endocrinology

## 2018-08-16 ENCOUNTER — Ambulatory Visit: Payer: 59 | Admitting: Oncology

## 2018-08-16 ENCOUNTER — Ambulatory Visit (HOSPITAL_COMMUNITY): Payer: 59

## 2018-08-18 ENCOUNTER — Telehealth: Payer: Self-pay

## 2018-08-18 NOTE — Telephone Encounter (Signed)
Overdue for an appt. LVM requesting returned call. 

## 2018-08-24 ENCOUNTER — Ambulatory Visit (INDEPENDENT_AMBULATORY_CARE_PROVIDER_SITE_OTHER): Payer: 59 | Admitting: Endocrinology

## 2018-08-24 ENCOUNTER — Encounter: Payer: Self-pay | Admitting: Endocrinology

## 2018-08-24 DIAGNOSIS — E1159 Type 2 diabetes mellitus with other circulatory complications: Secondary | ICD-10-CM | POA: Diagnosis not present

## 2018-08-24 DIAGNOSIS — E1140 Type 2 diabetes mellitus with diabetic neuropathy, unspecified: Secondary | ICD-10-CM | POA: Diagnosis not present

## 2018-08-24 DIAGNOSIS — E1165 Type 2 diabetes mellitus with hyperglycemia: Secondary | ICD-10-CM | POA: Diagnosis not present

## 2018-08-24 DIAGNOSIS — IMO0002 Reserved for concepts with insufficient information to code with codable children: Secondary | ICD-10-CM

## 2018-08-24 MED ORDER — INSULIN LISPRO (1 UNIT DIAL) 100 UNIT/ML (KWIKPEN): PEN_INJECTOR | SUBCUTANEOUS | 0 refills | Status: DC

## 2018-08-24 NOTE — Patient Instructions (Addendum)
Blood tests are requested for you today.  We'll let you know about the results.   check your blood sugar twice a day.  vary the time of day when you check, between before the 3 meals, and at bedtime.  also check if you have symptoms of your blood sugar being too high or too low.  please keep a record of the readings and bring it to your next appointment here (or you can bring the meter itself).  You can write it on any piece of paper.  please call us sooner if your blood sugar goes below 70, or if you have a lot of readings over 200. Please come back for a follow-up appointment in 3 months.   

## 2018-08-24 NOTE — Progress Notes (Signed)
Subjective:    Patient ID: Alan Morrison, male    DOB: Dec 02, 1950, 68 y.o.   MRN: [id number]  HPI  telehealth visit today via doxy video visit.  Alternatives to telehealth are presented to this patient, and the patient agrees to the telehealth visit. Pt is advised of the cost of the visit, and agrees to this, also.   Patient is at home, and I am at the office.   Persons attending the telehealth visit: the patient and I Pt returns for f/u of diabetes mellitus: DM type: Insulin-requiring type 2.  Dx'ed: 8182 Complications: polyneuropathy, renal insuff, and CAD Therapy: insulin since early 2018, and metformin.   DKA: never Severe hypoglycemia: never.   Pancreatitis: never.   Pancreatic imaging: never.  Other: he takes multiple daily injections.  Interval history: He brings a record of his fasting cbg's which I have reviewed today.  cbg's vary from 100-165.  There is no trend throughout the day.  pt states he feels well in general.   Past Medical History:  Diagnosis Date  . Anemia, iron deficiency, inadequate dietary intake   . 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   by [place]  . CAD (coronary artery disease) 2003   cath - Min Dz, EF 65%, Adm.R/O'd  . Cervical spondylosis without myelopathy   . Chest pain, atypical   . Choroidal nevus 01/22/2011   left, yearly eye exam, no diabetic retinopathy  . Diabetes mellitus type II   . Dyspepsia   . Ex-smoker   . Fatty liver 02/2016   by [place]  . GERD (gastroesophageal reflux disease)   . Headache(784.0)   . Heart murmur    Hx of  . History of MRI of brain and brain stem 09/06  . History of MRSA infection 12/2013   R knee boil  . HLD (hyperlipidemia)   . Hyperuricemia   . Obesity   . Sleep apnea   . Stress-induced cardiomyopathy 09/27/98   WNL, EF 64%  . Urosepsis 01/01-01/05/10   Hospitalization    Past Surgical History:  Procedure Laterality Date  . COLONOSCOPY  07/2013   1 benign polyp rpt 10 yrs [name])  . KNEE ARTHROSCOPY  Left 1988  . KNEE SURGERY Right 05/21/03   Right, Dr. Marlou Sa, med meniscus tear via MRI  . LEFT HEART CATH AND CORONARY ANGIOGRAPHY N/A 04/01/2017   Procedure: LEFT HEART CATH AND CORONARY ANGIOGRAPHY;  Surgeon: Martinique, Peter M, MD;  Location: [facility];  Service: Cardiovascular;  Laterality: N/A;  . MYELOGRAM  08/05   bulging disc    Social History   Socioeconomic History  . Marital status: Married    Spouse name: Not on file  . Number of children: 2  . Years of education: Not on file  . Highest education level: Not on file  Occupational History  . Occupation: Maintenance    Employer: [employer]  Social Needs  . Financial resource strain: Not on file  . Food insecurity:    Worry: Not on file    Inability: Not on file  . Transportation needs:    Medical: Not on file    Non-medical: Not on file  Tobacco Use  . Smoking status: Former Smoker    Last attempt to quit: 04/20/1974    Years since quitting: 44.3  . Smokeless tobacco: Never Used  . Tobacco comment: quit over 20 years  Substance and Sexual Activity  . Alcohol use: Yes    Alcohol/week: 3.0 standard drinks  Types: 3 Cans of beer per week    Comment: occasional  . Drug use: No  . Sexual activity: Not on file  Lifestyle  . Physical activity:    Days per week: Not on file    Minutes per session: Not on file  . Stress: Not on file  Relationships  . Social connections:    Talks on phone: Not on file    Gets together: Not on file    Attends religious service: Not on file    Active member of club or organization: Not on file    Attends meetings of clubs or organizations: Not on file    Relationship status: Not on file  . Intimate partner violence:    Fear of current or ex partner: Not on file    Emotionally abused: Not on file    Physically abused: Not on file    Forced sexual activity: Not on file  Other Topics Concern  . Not on file  Social History Narrative   Lives with wife    Occupation: equipment maintenance   Activity: no regular exercise - hunts   Diet: good water, fruits/vegetables    Current Outpatient Medications on File Prior to Visit  Medication Sig Dispense Refill  . Cholecalciferol (VITAMIN D) 2000 units tablet Take 2,000 Units by mouth daily.    . ferrous sulfate 325 (65 FE) MG tablet Take 1 tablet (325 mg total) by mouth daily as needed (fatigue).  3  . finasteride (PROSCAR) 5 MG tablet Take 1 tablet (5 mg total) by mouth daily. 90 tablet 1  . glucose blood (ONE TOUCH ULTRA TEST) test strip Check sugars daily 100 each 3  . Insulin Glargine (BASAGLAR KWIKPEN) 100 UNIT/ML SOPN Inject 0.75 mLs (75 Units total) into the skin at bedtime. 10 pen 11  . Insulin Pen Needle (B-D ULTRAFINE III SHORT PEN) 31G X 8 MM MISC 1 Device by Other route 4 (four) times daily. USE AS DIRECTED DAILY 120 each 11  . losartan (COZAAR) 25 MG tablet TAKE 1 TABLET BY MOUTH EVERY DAY 90 tablet 1  . metFORMIN (GLUCOPHAGE) 1000 MG tablet Take 1 tablet (1,000 mg total) by mouth 2 (two) times daily. 180 tablet 1  . metoprolol tartrate (LOPRESSOR) 25 MG tablet Take 0.5 tablets (12.5 mg total) by mouth 2 (two) times daily. 30 tablet 11  . Multiple Vitamin (MULTIVITAMIN WITH MINERALS) TABS tablet Take 1 tablet by mouth daily.    [name] omeprazole (PRILOSEC) 20 MG capsule Take 1 capsule (20 mg total) by mouth daily. 30 capsule 3  . probenecid (BENEMID) 500 MG tablet Take 0.5 tablets (250 mg total) by mouth daily. 45 tablet 1  . rivaroxaban (XARELTO) 20 MG TABS tablet Take 1 tablet (20 mg total) by mouth daily with supper. 30 tablet 6  . rosuvastatin (CRESTOR) 40 MG tablet Take 1 tablet (40 mg total) by mouth daily. <PLEASE MAKE APPOINTMENT FOR REFILLS> 90 tablet 0  . tamsulosin (FLOMAX) 0.4 MG CAPS capsule Take 1 capsule (0.4 mg total) by mouth daily. 90 capsule 1  . vitamin C (ASCORBIC ACID) 500 MG tablet Take 500 mg by mouth every evening.      No current facility-administered medications on  file prior to visit.     Allergies  Allergen Reactions  . Victoza [Liraglutide] Nausea Only    Irritation around injection site    Family History  Problem Relation Age of Onset  . Heart disease Mother  CHF  . Diabetes Mother   . Heart disease Father        CHF  . Hypertension Father   . Migraines Brother        severe headaches from arsenic in the past from  wood that was treated on his deck  . CAD Brother 35       stent  . Cancer Maternal Uncle        unsure  . Stroke Neg Hx   . Colon cancer Neg Hx      Review of Systems He denies hypoglycemia.     Objective:   Physical Exam   Lab Results  Component Value Date   CREATININE 1.20 06/28/2018   BUN 29 (H) 06/28/2018   NA 141 06/28/2018   K 4.6 06/28/2018   CL 106 06/28/2018   CO2 24 06/28/2018   Lab Results  Component Value Date   HGBA1C 7.1 (H) 08/25/2018       Assessment & Plan:  Insulin-requiring type 2 DM, with PN: he needs increased rx: add 1 unit to each shot of humalog (that would make it 26-16-26 units).  Renal insuff: in this setting, mealtime insulin is chosen for increase CAD: in this setting, he should avoid hypoglycemia.

## 2018-08-25 ENCOUNTER — Other Ambulatory Visit: Payer: Self-pay | Admitting: Endocrinology

## 2018-08-25 ENCOUNTER — Other Ambulatory Visit (INDEPENDENT_AMBULATORY_CARE_PROVIDER_SITE_OTHER): Payer: 59

## 2018-08-25 ENCOUNTER — Other Ambulatory Visit: Payer: Self-pay

## 2018-08-25 DIAGNOSIS — E1165 Type 2 diabetes mellitus with hyperglycemia: Secondary | ICD-10-CM | POA: Diagnosis not present

## 2018-08-25 DIAGNOSIS — E1140 Type 2 diabetes mellitus with diabetic neuropathy, unspecified: Secondary | ICD-10-CM

## 2018-08-25 DIAGNOSIS — IMO0002 Reserved for concepts with insufficient information to code with codable children: Secondary | ICD-10-CM

## 2018-08-25 LAB — HEMOGLOBIN A1C: Hgb A1c MFr Bld: 7.1 % — ABNORMAL HIGH (ref 4.6–6.5)

## 2018-08-25 MED ORDER — INSULIN LISPRO (1 UNIT DIAL) 100 UNIT/ML (KWIKPEN): PEN_INJECTOR | SUBCUTANEOUS | 0 refills | Status: DC

## 2018-08-28 LAB — FRUCTOSAMINE: Fructosamine: 284 umol/L (ref 205–285)

## 2018-09-17 ENCOUNTER — Other Ambulatory Visit: Payer: Self-pay | Admitting: Endocrinology

## 2018-10-01 ENCOUNTER — Other Ambulatory Visit: Payer: Self-pay

## 2018-10-02 ENCOUNTER — Other Ambulatory Visit: Payer: Self-pay | Admitting: Oncology

## 2018-10-03 MED ORDER — ROSUVASTATIN CALCIUM 40 MG PO TABS: 40.0000 mg | ORAL_TABLET | Freq: Every day | ORAL | 0 refills | Status: DC

## 2018-10-03 NOTE — Telephone Encounter (Signed)
ONLY REFILL UNTIL APPT 10/31/18

## 2018-10-06 ENCOUNTER — Other Ambulatory Visit: Payer: Self-pay | Admitting: Family Medicine

## 2018-10-11 ENCOUNTER — Encounter: Payer: Self-pay | Admitting: Oncology

## 2018-10-14 ENCOUNTER — Encounter: Payer: Self-pay | Admitting: Oncology

## 2018-10-20 ENCOUNTER — Telehealth: Payer: Self-pay | Admitting: Oncology

## 2018-10-20 NOTE — Telephone Encounter (Signed)
GM PAL 7/21 f/u moved to 7/31 as webex. Left message for patient. Schedule mailed.

## 2018-10-24 ENCOUNTER — Telehealth: Payer: Self-pay

## 2018-10-24 ENCOUNTER — Other Ambulatory Visit: Payer: Self-pay | Admitting: Endocrinology

## 2018-10-24 MED ORDER — LANTUS SOLOSTAR 100 UNIT/ML ~~LOC~~ SOPN: 75.0000 [IU] | PEN_INJECTOR | Freq: Every day | SUBCUTANEOUS | 99 refills | Status: DC

## 2018-10-24 NOTE — Telephone Encounter (Signed)
Received notification from [facility] stating Nancee Liter is no longer covered by insurance. Requesting Rx be changed to Lantus or Toujeo. Provided correspondence to Dr. Loanne Drilling for him to review and address. New Rx sent per Dr. Loanne Drilling.

## 2018-10-25 ENCOUNTER — Encounter: Payer: Self-pay | Admitting: Oncology

## 2018-10-28 NOTE — Progress Notes (Signed)
Cardiology Office Note    Date:  10/31/2018   ID:  Tynan Boesel, DOB 11/02/1950, MRN [id number]  PCP:  [name], MD  Cardiologist:  Dr. Martinique  Chief Complaint  Patient presents with  . Follow-up  . Shortness of Breath    At times.  . Edema    History of Present Illness:  Alan Morrison is a 68 y.o. male with PMH of DM II, HLD and OSA.  He had a history of nonobstructive CAD by cardiac cath in 2003.  Myoview in 2000 and was normal.  He also had a normal echocardiogram in 2012 as well. In 2018 he developed chest pain.   He was started on 50 mg Toprol-XL.  Coronary CT showed significant blockage in the mid RCA, mid LAD and the distal LAD.   He underwent cardiac catheterization on 04/01/2017 which showed  nonobstructive disease with 50% mid LAD, 30% proximal LAD, 25% mid RCA lesion.   Medical therapy was recommended.  Patient reports that in October he developed swelling in his left leg with discomfort. He just put up with it until January when it really became more painful to walk. LE venous doppler showed a left femoral DVT. He was started on Xarelto. Seen by Dr Jana Hakim. No clear trigger for DVT. Since then still has occasional soreness in leg. Swelling has largely resolved. No significant chest pain or dyspnea. BS control has improved significantly with A1c down to 7.1%.    Past Medical History:  Diagnosis Date  . Anemia, iron deficiency, inadequate dietary intake   . 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   by [place]  . CAD (coronary artery disease) 2003   cath - Min Dz, EF 65%, Adm.R/O'd  . Cervical spondylosis without myelopathy   . Chest pain, atypical   . Choroidal nevus 01/22/2011   left, yearly eye exam, no diabetic retinopathy  . Diabetes mellitus type II   . Dyspepsia   . Ex-smoker   . Fatty liver 02/2016   by [place]  . GERD (gastroesophageal reflux disease)   . Headache(784.0)   . Heart murmur    Hx of  . History of MRI of brain and brain stem 09/06  . History  of MRSA infection 12/2013   R knee boil  . HLD (hyperlipidemia)   . Hyperuricemia   . Obesity   . Sleep apnea   . Stress-induced cardiomyopathy 09/27/98   WNL, EF 64%  . Urosepsis 01/01-01/05/10   Hospitalization    Past Surgical History:  Procedure Laterality Date  . COLONOSCOPY  07/2013   1 benign polyp rpt 10 yrs [name])  . KNEE ARTHROSCOPY Left 1988  . KNEE SURGERY Right 05/21/03   Right, Dr. Marlou Sa, med meniscus tear via MRI  . LEFT HEART CATH AND CORONARY ANGIOGRAPHY N/A 04/01/2017   Procedure: LEFT HEART CATH AND CORONARY ANGIOGRAPHY;  Surgeon: Morrison, Merril Nagy M, MD;  Location: [facility];  Service: Cardiovascular;  Laterality: N/A;  . MYELOGRAM  08/05   bulging disc    Current Medications: Outpatient Medications Prior to Visit  Medication Sig Dispense Refill  . Cholecalciferol (VITAMIN D) 2000 units tablet Take 2,000 Units by mouth daily.    . ferrous sulfate 325 (65 FE) MG tablet Take 1 tablet (325 mg total) by mouth daily as needed (fatigue).  3  . finasteride (PROSCAR) 5 MG tablet TAKE 1 TABLET BY MOUTH  DAILY 90 tablet 0  . glucose blood (ONE TOUCH ULTRA TEST) test strip Check sugars  daily 100 each 3  . Insulin Glargine (LANTUS SOLOSTAR) 100 UNIT/ML Solostar Pen Inject 75 Units into the skin at bedtime. 25 pen PRN  . insulin lispro (HUMALOG KWIKPEN) 100 UNIT/ML KwikPen INJECT 3 TIMES A DAY (JUST BEFORE EACH MEAL) 25-15-25 UNITS. 15 mL 1  . Insulin Pen Needle (B-D ULTRAFINE III SHORT PEN) 31G X 8 MM MISC 1 Device by Other route 4 (four) times daily. USE AS DIRECTED DAILY 120 each 11  . losartan (COZAAR) 25 MG tablet TAKE 1 TABLET BY MOUTH EVERY DAY 90 tablet 1  . metFORMIN (GLUCOPHAGE) 1000 MG tablet TAKE 1 TABLET BY MOUTH TWO  TIMES DAILY 180 tablet 0  . Multiple Vitamin (MULTIVITAMIN WITH MINERALS) TABS tablet Take 1 tablet by mouth daily.    . probenecid (BENEMID) 500 MG tablet TAKE ONE-HALF TABLET BY  MOUTH DAILY 45 tablet 0  . rivaroxaban (XARELTO) 20 MG TABS  tablet Take 1 tablet (20 mg total) by mouth daily with supper. 30 tablet 6  . rosuvastatin (CRESTOR) 40 MG tablet Take 1 tablet (40 mg total) by mouth daily. <PLEASE KEEP APPOINTMENT > 90 tablet 0  . tamsulosin (FLOMAX) 0.4 MG CAPS capsule TAKE 1 CAPSULE BY MOUTH  DAILY 90 capsule 0  . vitamin C (ASCORBIC ACID) 500 MG tablet Take 500 mg by mouth every evening.     . metoprolol tartrate (LOPRESSOR) 25 MG tablet Take 0.5 tablets (12.5 mg total) by mouth 2 (two) times daily. 30 tablet 11  . omeprazole (PRILOSEC) 20 MG capsule Take 1 capsule (20 mg total) by mouth daily. 30 capsule 3   No facility-administered medications prior to visit.      Allergies:   Victoza [liraglutide]   Social History   Socioeconomic History  . Marital status: Married    Spouse name: Not on file  . Number of children: 2  . Years of education: Not on file  . Highest education level: Not on file  Occupational History  . Occupation: Maintenance    Employer: [employer]  Social Needs  . Financial resource strain: Not on file  . Food insecurity    Worry: Not on file    Inability: Not on file  . Transportation needs    Medical: Not on file    Non-medical: Not on file  Tobacco Use  . Smoking status: Former Smoker    Quit date: 04/20/1974    Years since quitting: 44.5  . Smokeless tobacco: Never Used  . Tobacco comment: quit over 20 years  Substance and Sexual Activity  . Alcohol use: Yes    Alcohol/week: 3.0 standard drinks    Types: 3 Cans of beer per week    Comment: occasional  . Drug use: No  . Sexual activity: Not on file  Lifestyle  . Physical activity    Days per week: Not on file    Minutes per session: Not on file  . Stress: Not on file  Relationships  . Social Herbalist on phone: Not on file    Gets together: Not on file    Attends religious service: Not on file    Active member of club or organization: Not on file    Attends meetings of clubs or organizations: Not on  file    Relationship status: Not on file  Other Topics Concern  . Not on file  Social History Narrative   Lives with wife   Occupation: equipment maintenance   Activity: no regular  exercise - hunts   Diet: good water, fruits/vegetables     Family History:  The patient's family history includes CAD (age of onset: 42) in his brother; Cancer in his maternal uncle; Diabetes in his mother; Heart disease in his father and mother; Hypertension in his father; Migraines in his brother.   ROS:   Please see the history of present illness.    ROS All other systems reviewed and are negative.   PHYSICAL EXAM:   VS:  BP 106/66 (BP Location: Left Arm, Patient Position: Sitting, Cuff Size: Normal)   Pulse 82   Temp (!) 97.3 F (36.3 C)   Ht 5\' 8"  (1.727 m)   Wt 215 lb (97.5 kg)   BMI 32.69 kg/m    GEN: Well nourished, well developed, in no acute distress  HEENT: normal  Neck: no JVD, carotid bruits, or masses Cardiac: RRR; no murmurs, rubs, or gallops,no edema  Respiratory:  clear to auscultation bilaterally, normal work of breathing GI: soft, nontender, nondistended, + BS MS: no deformity or atrophy  Skin: warm and dry, no rash Neuro:  Alert and Oriented x 3, Strength and sensation are intact Psych: euthymic mood, full affect  Wt Readings from Last 3 Encounters:  10/31/18 215 lb (97.5 kg)  06/28/18 210 lb 1.6 oz (95.3 kg)  06/09/18 208 lb 12.8 oz (94.7 kg)      Studies/Labs Reviewed:   EKG:  EKG is  ordered today. NSR with normal Ecg. I have personally reviewed and interpreted this study.   Recent Labs: 06/28/2018: ALT 34; BUN 29; Creatinine 1.20; Hemoglobin 14.6; Platelet Count 200; Potassium 4.6; Sodium 141   Lipid Panel    Component Value Date/Time   CHOL 93 10/11/2017 0753   CHOL 108 06/21/2017 0804   TRIG 99.0 10/11/2017 0753   HDL 29.00 (L) 10/11/2017 0753   HDL 31 (L) 06/21/2017 0804   CHOLHDL 3 10/11/2017 0753   VLDL 19.8 10/11/2017 0753   LDLCALC 44 10/11/2017  0753   LDLCALC 57 06/21/2017 0804    Additional studies/ records that were reviewed today include:   Coronary CT 03/19/2017 IMPRESSION: 1. Coronary artery calcium score 790 Agatston units, placing the patient in the 89th percentile for age and gender. This suggests high risk for future cardiac events.  2. Extensive plaque in the proximal to mid LAD, possible area of moderate stenosis in the mid LAD.  3.  Moderate OM1 with moderate stenosis proximally.  4.  Mild to possibly moderate mid RCA stenosis.  See CT report from 11/28. This is the FFR result from the 11/28 study.  FFR 0.82 in mid RCA, suggesting borderline significant stenosis. FFR 0.76 in the PDA, suggesting hemodynamically significant disease involving the PDA.     Cath 04/01/2017 Conclusion     Mid LAD to Dist LAD lesion is 45% stenosed.  Mid LAD lesion is 50% stenosed.  Prox LAD lesion is 35% stenosed.  Mid RCA lesion is 25% stenosed.  Ost RPDA lesion is 25% stenosed.  The left ventricular systolic function is normal.  LV end diastolic pressure is normal.  The left ventricular ejection fraction is 55-65% by visual estimate.   1. Nonobstructive CAD 2. Normal LV function.  3. Normal LVEDP  Plan: although CT FFR was abnormal there appears to be nonobstructive disease by angiography. I would recommend continued medical therapy and risk factor modification       ASSESSMENT:    1. Coronary artery disease involving native coronary artery of native heart  without angina pectoris   2. Pure hypercholesterolemia   3. OSA on CPAP   4. Chronic deep vein thrombosis (DVT) of femoral vein of left lower extremity (HCC)      PLAN:  In order of problems listed above:  1. CAD: Nonobstructive disease noted on cardiac catheterization 2003, s/p cardiac catheterization Dec 2018 due to abnormal coronary CT, this revealed a mild progression of disease compared to the previous cath, however no critical  disease noted.  He is asymptomatic.  Continue aggressive risk factor modification.   Continue aspirin, metoprolol, high dose Crestor.   2. Hyperlipidemia: plan to repeat fasting lipid panel.   3. DM 2:   Improved control with A1c 7.1%.  4. Obstructive sleep apnea on CPAP: Compliant with CPAP machine.  5.   HTN well controlled.   6.   Left femoral DVT- unprovoked. To follow up with Dr Jana Hakim later this month with repeat LE doppler. To decide on whether to continue long term anticoagulation.   Medication Adjustments/Labs and Tests Ordered: Current medicines are reviewed at length with the patient today.  Concerns regarding medicines are outlined above.  Medication changes, Labs and Tests ordered today are listed in the Patient Instructions below. There are no Patient Instructions on file for this visit.   Signed, Alan Borgerding Martinique, MD  10/31/2018 3:45 PM    [facility] [address], [place], [place]  [address] Phone: [phone]; Fax: [phone]

## 2018-10-31 ENCOUNTER — Other Ambulatory Visit: Payer: Self-pay

## 2018-10-31 ENCOUNTER — Other Ambulatory Visit: Payer: Self-pay | Admitting: *Deleted

## 2018-10-31 ENCOUNTER — Ambulatory Visit (INDEPENDENT_AMBULATORY_CARE_PROVIDER_SITE_OTHER): Payer: 59 | Admitting: Cardiology

## 2018-10-31 ENCOUNTER — Encounter: Payer: Self-pay | Admitting: Cardiology

## 2018-10-31 VITALS — BP 106/66 | HR 82 | Temp 97.3°F | Ht 68.0 in | Wt 215.0 lb

## 2018-10-31 DIAGNOSIS — E7800 Pure hypercholesterolemia, unspecified: Secondary | ICD-10-CM

## 2018-10-31 DIAGNOSIS — G4733 Obstructive sleep apnea (adult) (pediatric): Secondary | ICD-10-CM

## 2018-10-31 DIAGNOSIS — Z9989 Dependence on other enabling machines and devices: Secondary | ICD-10-CM

## 2018-10-31 DIAGNOSIS — I2510 Atherosclerotic heart disease of native coronary artery without angina pectoris: Secondary | ICD-10-CM | POA: Diagnosis not present

## 2018-10-31 DIAGNOSIS — I82512 Chronic embolism and thrombosis of left femoral vein: Secondary | ICD-10-CM | POA: Diagnosis not present

## 2018-11-01 ENCOUNTER — Inpatient Hospital Stay: Payer: 59 | Attending: Oncology

## 2018-11-01 ENCOUNTER — Other Ambulatory Visit: Payer: Self-pay

## 2018-11-01 ENCOUNTER — Other Ambulatory Visit: Payer: Self-pay | Admitting: Oncology

## 2018-11-01 DIAGNOSIS — Z87891 Personal history of nicotine dependence: Secondary | ICD-10-CM | POA: Insufficient documentation

## 2018-11-01 DIAGNOSIS — E669 Obesity, unspecified: Secondary | ICD-10-CM | POA: Diagnosis not present

## 2018-11-01 DIAGNOSIS — I82413 Acute embolism and thrombosis of femoral vein, bilateral: Secondary | ICD-10-CM

## 2018-11-01 DIAGNOSIS — Z794 Long term (current) use of insulin: Secondary | ICD-10-CM | POA: Insufficient documentation

## 2018-11-01 DIAGNOSIS — Z86718 Personal history of other venous thrombosis and embolism: Secondary | ICD-10-CM | POA: Diagnosis not present

## 2018-11-01 DIAGNOSIS — E119 Type 2 diabetes mellitus without complications: Secondary | ICD-10-CM | POA: Diagnosis not present

## 2018-11-01 DIAGNOSIS — Z7901 Long term (current) use of anticoagulants: Secondary | ICD-10-CM | POA: Insufficient documentation

## 2018-11-01 DIAGNOSIS — D509 Iron deficiency anemia, unspecified: Secondary | ICD-10-CM | POA: Diagnosis present

## 2018-11-01 DIAGNOSIS — Z79899 Other long term (current) drug therapy: Secondary | ICD-10-CM | POA: Insufficient documentation

## 2018-11-01 DIAGNOSIS — E785 Hyperlipidemia, unspecified: Secondary | ICD-10-CM | POA: Insufficient documentation

## 2018-11-01 DIAGNOSIS — D751 Secondary polycythemia: Secondary | ICD-10-CM

## 2018-11-01 LAB — CBC WITH DIFFERENTIAL/PLATELET
Abs Immature Granulocytes: 0.04 10*3/uL (ref 0.00–0.07)
Basophils Absolute: 0.1 10*3/uL (ref 0.0–0.1)
Basophils Relative: 1 %
Eosinophils Absolute: 0.2 10*3/uL (ref 0.0–0.5)
Eosinophils Relative: 3 %
HCT: 45.1 % (ref 39.0–52.0)
Hemoglobin: 15.2 g/dL (ref 13.0–17.0)
Immature Granulocytes: 1 %
Lymphocytes Relative: 18 %
Lymphs Abs: 1.3 10*3/uL (ref 0.7–4.0)
MCH: 29.9 pg (ref 26.0–34.0)
MCHC: 33.7 g/dL (ref 30.0–36.0)
MCV: 88.8 fL (ref 80.0–100.0)
Monocytes Absolute: 0.5 10*3/uL (ref 0.1–1.0)
Monocytes Relative: 7 %
Neutro Abs: 5 10*3/uL (ref 1.7–7.7)
Neutrophils Relative %: 70 %
Platelets: 195 10*3/uL (ref 150–400)
RBC: 5.08 MIL/uL (ref 4.22–5.81)
RDW: 13.2 % (ref 11.5–15.5)
WBC: 7.1 10*3/uL (ref 4.0–10.5)
nRBC: 0 % (ref 0.0–0.2)

## 2018-11-01 LAB — CMP (CANCER CENTER ONLY)
ALT: 31 U/L (ref 0–44)
AST: 28 U/L (ref 15–41)
Albumin: 3.9 g/dL (ref 3.5–5.0)
Alkaline Phosphatase: 50 U/L (ref 38–126)
Anion gap: 10 (ref 5–15)
BUN: 28 mg/dL — ABNORMAL HIGH (ref 8–23)
CO2: 26 mmol/L (ref 22–32)
Calcium: 9.7 mg/dL (ref 8.9–10.3)
Chloride: 107 mmol/L (ref 98–111)
Creatinine: 1.27 mg/dL — ABNORMAL HIGH (ref 0.61–1.24)
GFR, Est AFR Am: >60 mL/min (ref >60)
GFR, Estimated: 58 mL/min — ABNORMAL LOW (ref >60)
Glucose, Bld: 63 mg/dL — ABNORMAL LOW (ref 70–99)
Potassium: 4.5 mmol/L (ref 3.5–5.1)
Sodium: 143 mmol/L (ref 135–145)
Total Bilirubin: 0.4 mg/dL (ref 0.3–1.2)
Total Protein: 7.4 g/dL (ref 6.5–8.1)

## 2018-11-01 LAB — D-DIMER, QUANTITATIVE: D-Dimer, Quant: <0.27 ug/mL-FEU (ref 0.00–0.50)

## 2018-11-01 LAB — URIC ACID: Uric Acid, Serum: 6.8 mg/dL (ref 3.7–8.6)

## 2018-11-02 LAB — LIPID PANEL
Chol/HDL Ratio: 3 ratio (ref 0.0–5.0)
Cholesterol, Total: 112 mg/dL (ref 100–199)
HDL: 37 mg/dL — ABNORMAL LOW (ref >39)
LDL Calculated: 59 mg/dL (ref 0–99)
Triglycerides: 78 mg/dL (ref 0–149)
VLDL Cholesterol Cal: 16 mg/dL (ref 5–40)

## 2018-11-08 ENCOUNTER — Ambulatory Visit: Payer: 59 | Admitting: Oncology

## 2018-11-08 ENCOUNTER — Encounter (HOSPITAL_COMMUNITY): Payer: 59

## 2018-11-14 ENCOUNTER — Encounter: Payer: Self-pay | Admitting: Oncology

## 2018-11-17 ENCOUNTER — Telehealth: Payer: Self-pay | Admitting: Oncology

## 2018-11-17 NOTE — Telephone Encounter (Signed)
Called patient regarding upcoming Webex appointment, left a voicemail and this will be a telephone visit due to no communication to set up as a virtual visit.

## 2018-11-17 NOTE — Progress Notes (Signed)
[facility]  [phone] [phone]    ID: [name] DOB: [birth year]  MR#: [id number]  CXK#:[id number]  Patient Care Team: [name], MD as PCP - General (Family Medicine) Martinique, Peter M, MD as Consulting Physician (Cardiology) Renato Shin, MD as Consulting Physician (Endocrinology) Chabely Norby, Virgie Dad, MD as Consulting Physician (Hematology and Oncology) OTHER MD:   CHIEF COMPLAINT: Coagulopathy  CURRENT TREATMENT: Rivaroxaban 10 mg daily   HISTORY OF CURRENT ILLNESS: From the original intake note:  "Alan Morrison" Alan Morrison Morrison was referred by Dr. [name] for evaluation and treatment of deep vein thrombosis (DVT) of the left deep femoral vein . This originally presented as a possible Baker's cyst as noted at the 04/29/2018 appointment with Dr. Danise Mina, and a STAT Doppler ultrasound was performed on the same day, showing a clot involving the left proximal femoral vein.  Prior to the DVT, Alan Morrison did not have a change in medication, he had not been on a trip, and he did not experience any local trauma. He states that his left leg was extremely swollen and hard as a rock at the time of presentation.  This had been going on several weeks before he brought it to medical attention.  The patient's subsequent history is as detailed below.   INTERVAL HISTORY: Alan Morrison Morrison is contacted today for follow-up and treatment of his coagulopathy.   He continues on Rivaroxaban.  He has tolerated this well, with no bruising or bleeding.  Review of his flowsheet shows that his weight has actually increased to the current to 15.  It was 209 in February of this year.  On the other hand his d-dimer is normal, being 0.27 on 11/01/2018. Lipid panel shows an excellent LDL at 59.  He tells me his diabetes is under better control and his blood sugar here on 11/01/2018 was 63.  I do not have a hemoglobin A1c.  Repeat Doppler ultrasonography of the both lower  extremities 11/18/2018 shows no evidence of DVT on either side.   REVIEW OF SYSTEMS: Alan Morrison Morrison is not exercising.  His job is moderately physical.  They do maintain proper pandemic precautions there.  He tells me it is "pretty much what ever I want" but his wife is encouraging vegetables.  He has some changes in the medial aspect of his more left foot which he wanted me to look at.  He still has some soreness in his left calf when he feels that area but he does not see any change and this does not keep him from walking and it is not noticeable when walking.  He feels tired all the time.  He does not sleep well.  He does feel the CPAP is helping.  Aside from these issues a detailed review of systems today was stable  PAST MEDICAL HISTORY: Past Medical History:  Diagnosis Date  . Anemia, iron deficiency, inadequate dietary intake   . 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   by [place]  . CAD (coronary artery disease) 2003   cath - Min Dz, EF 65%, Adm.R/O'd  . Cervical spondylosis without myelopathy   . Chest pain, atypical   . Choroidal nevus 01/22/2011   left, yearly eye exam, no diabetic retinopathy  . Diabetes mellitus type II   . Dyspepsia   . Ex-smoker   . Fatty liver 02/2016   by [place]  . GERD (gastroesophageal reflux disease)   . Headache(784.0)   . Heart murmur    Hx of  . History of MRI of  brain and brain stem 09/06  . History of MRSA infection 12/2013   R knee boil  . HLD (hyperlipidemia)   . Hyperuricemia   . Obesity   . Sleep apnea   . Stress-induced cardiomyopathy 09/27/98   WNL, EF 64%  . Urosepsis 01/01-01/05/10   Hospitalization     PAST SURGICAL HISTORY: Past Surgical History:  Procedure Laterality Date  . COLONOSCOPY  07/2013   1 benign polyp rpt 10 yrs [name])  . KNEE ARTHROSCOPY Left 1988  . KNEE SURGERY Right 05/21/03   Right, Dr. Marlou Sa, med meniscus tear via MRI  . LEFT HEART CATH AND CORONARY ANGIOGRAPHY N/A 04/01/2017   Procedure: LEFT HEART CATH AND CORONARY  ANGIOGRAPHY;  Surgeon: Martinique, Peter M, MD;  Location: [facility];  Service: Cardiovascular;  Laterality: N/A;  . MYELOGRAM  08/05   bulging disc     FAMILY HISTORY: Family History  Problem Relation Age of Onset  . Heart disease Mother        CHF  . Diabetes Mother   . Heart disease Father        CHF  . Hypertension Father   . Migraines Brother        severe headaches from arsenic in the past from  wood that was treated on his deck  . CAD Brother 53       stent  . Cancer Maternal Uncle        unsure  . Stroke Neg Hx   . Colon cancer Neg Hx    Alan Morrison's father died from heart disease at age 33. Patients' mother died from heart disease at age 7; she had a history of a leg clot at age 43. The patient has 2 brothers and 2 sisters. Patient denies anyone in her family having breast, ovarian, prostate, or pancreatic cancer. Alan Morrison's eldest sister had a myocardial infarction. His youngest brother had a stent placed.    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 His wife, Alan Morrison Morrison, retired from the Audiological scientist in 11/2017. Their children, Alan Morrison Morrison and Alan Morrison Morrison, work at [employer] respectively. Alan Morrison Morrison attends the [employer]   ADVANCED DIRECTIVES: Alan Morrison's wife, Alan Morrison Morrison, is automatically his healthcare power of attorney.     HEALTH MAINTENANCE: Social History   Tobacco Use  . Smoking status: Former Smoker    Quit date: 04/20/1974    Years since quitting: 44.6  . Smokeless tobacco: Never Used  . Tobacco comment: quit over 20 years  Substance Use Topics  . Alcohol use: Yes    Alcohol/week: 3.0 standard drinks    Types: 3 Cans of beer per week    Comment: occasional  . Drug use: No    Colonoscopy:   Bone density:    Allergies  Allergen Reactions  . Victoza [Liraglutide] Nausea Only    Irritation around injection site    Current Outpatient Medications   Medication Sig Dispense Refill  . Cholecalciferol (VITAMIN D) 2000 units tablet Take 2,000 Units by mouth daily.    . ferrous sulfate 325 (65 FE) MG tablet Take 1 tablet (325 mg total) by mouth daily as needed (fatigue).  3  . finasteride (PROSCAR) 5 MG tablet TAKE 1 TABLET BY MOUTH  DAILY 90 tablet 0  . glucose blood (ONE TOUCH ULTRA TEST) test strip Check sugars daily 100 each 3  . Insulin Glargine (LANTUS SOLOSTAR) 100 UNIT/ML Solostar Pen Inject 75  Units into the skin at bedtime. 25 pen PRN  . insulin lispro (HUMALOG KWIKPEN) 100 UNIT/ML KwikPen INJECT 3 TIMES A DAY (JUST BEFORE EACH MEAL) 25-15-25 UNITS. 15 mL 1  . Insulin Pen Needle (B-D ULTRAFINE III SHORT PEN) 31G X 8 MM MISC 1 Device by Other route 4 (four) times daily. USE AS DIRECTED DAILY 120 each 11  . losartan (COZAAR) 25 MG tablet TAKE 1 TABLET BY MOUTH EVERY DAY 90 tablet 1  . metFORMIN (GLUCOPHAGE) 1000 MG tablet TAKE 1 TABLET BY MOUTH TWO  TIMES DAILY 180 tablet 0  . metoprolol tartrate (LOPRESSOR) 25 MG tablet Take 0.5 tablets (12.5 mg total) by mouth 2 (two) times daily. 30 tablet 11  . Multiple Vitamin (MULTIVITAMIN WITH MINERALS) TABS tablet Take 1 tablet by mouth daily.    . probenecid (BENEMID) 500 MG tablet TAKE ONE-HALF TABLET BY  MOUTH DAILY 45 tablet 0  . rivaroxaban (XARELTO) 20 MG TABS tablet Take 1 tablet (20 mg total) by mouth daily with supper. 30 tablet 6  . rosuvastatin (CRESTOR) 40 MG tablet Take 1 tablet (40 mg total) by mouth daily. <PLEASE KEEP APPOINTMENT > 90 tablet 0  . tamsulosin (FLOMAX) 0.4 MG CAPS capsule TAKE 1 CAPSULE BY MOUTH  DAILY 90 capsule 0  . vitamin C (ASCORBIC ACID) 500 MG tablet Take 500 mg by mouth every evening.      No current facility-administered medications for this visit.      OBJECTIVE: Middle-aged white man with marked truncal obesity  Vitals:   11/18/18 1415  BP: 124/67  Pulse: 89  Resp: 18  Temp: 98.2 F (36.8 C)  SpO2: 97%     Body mass index is 32.1 kg/m.   Wt  Readings from Last 3 Encounters:  11/18/18 211 lb 1.6 oz (95.8 kg)  10/31/18 215 lb (97.5 kg)  06/28/18 210 lb 1.6 oz (95.3 kg)      ECOG FS:1 - Symptomatic but completely ambulatory  Sclerae unicteric, EOMs intact Wearing a mask No cervical or supraclavicular adenopathy Lungs no rales or rhonchi Heart regular rate and rhythm Abd soft, nontender, positive bowel sounds MSK no focal spinal tenderness, no upper extremity lymphedema Neuro: nonfocal, well oriented, appropriate affect    LAB RESULTS:  CMP     Component Value Date/Time   NA 143 11/01/2018 0807   NA 138 03/25/2017 1225   K 4.5 11/01/2018 0807   CL 107 11/01/2018 0807   CO2 26 11/01/2018 0807   GLUCOSE 63 (L) 11/01/2018 0807   BUN 28 (H) 11/01/2018 0807   BUN 29 (H) 03/25/2017 1225   CREATININE 1.27 (H) 11/01/2018 0807   CALCIUM 9.7 11/01/2018 0807   PROT 7.4 11/01/2018 0807   PROT 7.1 06/21/2017 0804   ALBUMIN 3.9 11/01/2018 0807   ALBUMIN 4.4 06/21/2017 0804   AST 28 11/01/2018 0807   ALT 31 11/01/2018 0807   ALKPHOS 50 11/01/2018 0807   BILITOT 0.4 11/01/2018 0807   GFRNONAA 58 (L) 11/01/2018 0807   GFRAA >60 11/01/2018 0807    No results found for: TOTALPROTELP, ALBUMINELP, A1GS, A2GS, BETS, BETA2SER, GAMS, MSPIKE, SPEI  No results found for: KPAFRELGTCHN, LAMBDASER, KAPLAMBRATIO  Lab Results  Component Value Date   WBC 7.1 11/01/2018   NEUTROABS 5.0 11/01/2018   HGB 15.2 11/01/2018   HCT 45.1 11/01/2018   MCV 88.8 11/01/2018   PLT 195 11/01/2018    @LASTCHEMISTRY @  No results found for: LABCA2  No components found for: [id number]  No results  for input(s): INR in the last 168 hours.  No results found for: LABCA2  Lab Results  Component Value Date   LHT342 23 06/28/2018    No results found for: AJG811  No results found for: XBW620  No results found for: CA2729  No components found for: HGQUANT  Lab Results  Component Value Date   CEA1 1.11 06/28/2018   /  CEA ([facility]In  House)  Date Value Ref Range Status  06/28/2018 1.11 0.00 - 5.00 ng/mL Final    Comment:    (NOTE) This test was performed using Architect's Chemiluminescent Microparticle Immunoassay. Values obtained from different assay methods cannot be used interchangeably. Please note that 5-10% of patients who smoke may see CEA levels up to 6.9 ng/mL. Performed at [facility], [place] [address] [place], [place] [address]      No results found for: AFPTUMOR  No results found for: [facility]  Lab Results  Component Value Date   PSA1 0.4 06/28/2018    No visits with results within 3 Day(s) from this visit.  Latest known visit with results is:  Appointment on 11/01/2018  Component Date Value Ref Range Status  . WBC 11/01/2018 7.1  4.0 - 10.5 K/uL Final  . RBC 11/01/2018 5.08  4.22 - 5.81 MIL/uL Final  . Hemoglobin 11/01/2018 15.2  13.0 - 17.0 g/dL Final  . HCT 11/01/2018 45.1  39.0 - 52.0 % Final  . MCV 11/01/2018 88.8  80.0 - 100.0 fL Final  . MCH 11/01/2018 29.9  26.0 - 34.0 pg Final  . MCHC 11/01/2018 33.7  30.0 - 36.0 g/dL Final  . RDW 11/01/2018 13.2  11.5 - 15.5 % Final  . Platelets 11/01/2018 195  150 - 400 K/uL Final  . nRBC 11/01/2018 0.0  0.0 - 0.2 % Final  . Neutrophils Relative % 11/01/2018 70  % Final  . Neutro Abs 11/01/2018 5.0  1.7 - 7.7 K/uL Final  . Lymphocytes Relative 11/01/2018 18  % Final  . Lymphs Abs 11/01/2018 1.3  0.7 - 4.0 K/uL Final  . Monocytes Relative 11/01/2018 7  % Final  . Monocytes Absolute 11/01/2018 0.5  0.1 - 1.0 K/uL Final  . Eosinophils Relative 11/01/2018 3  % Final  . Eosinophils Absolute 11/01/2018 0.2  0.0 - 0.5 K/uL Final  . Basophils Relative 11/01/2018 1  % Final  . Basophils Absolute 11/01/2018 0.1  0.0 - 0.1 K/uL Final  . Immature Granulocytes 11/01/2018 1  % Final  . Abs Immature Granulocytes 11/01/2018 0.04  0.00 - 0.07 K/uL Final   Performed at [facility], [place] [address],  [place], [place] [address]  . Uric Acid, Serum 11/01/2018 6.8  3.7 - 8.6 mg/dL Final   Performed at [facility], [place] [address] [place], [place] [address]  . D-Dimer, Quant 11/01/2018 <0.27  0.00 - 0.50 ug/mL-FEU Final   Comment: (NOTE) At the manufacturer cut-off of 0.50 ug/mL FEU, this assay has been documented to exclude PE with a sensitivity and negative predictive value of 97 to 99%.  At this time, this assay has not been approved by the FDA to exclude DVT/VTE. Results should be correlated with clinical presentation. Performed at [facility], [place] [address], [place],  [address]   . Sodium 11/01/2018 143  135 - 145 mmol/L Final  . Potassium 11/01/2018 4.5  3.5 - 5.1 mmol/L Final  . Chloride 11/01/2018 107  98 - 111 mmol/L Final  .  CO2 11/01/2018 26  22 - 32 mmol/L Final  . Glucose, Bld 11/01/2018 63* 70 - 99 mg/dL Final  . BUN 11/01/2018 28* 8 - 23 mg/dL Final  . Creatinine 11/01/2018 1.27* 0.61 - 1.24 mg/dL Final  . Calcium 11/01/2018 9.7  8.9 - 10.3 mg/dL Final  . Total Protein 11/01/2018 7.4  6.5 - 8.1 g/dL Final  . Albumin 11/01/2018 3.9  3.5 - 5.0 g/dL Final  . AST 11/01/2018 28  15 - 41 U/L Final  . ALT 11/01/2018 31  0 - 44 U/L Final  . Alkaline Phosphatase 11/01/2018 50  38 - 126 U/L Final  . Total Bilirubin 11/01/2018 0.4  0.3 - 1.2 mg/dL Final  . GFR, Est Non Af Am 11/01/2018 58* >60 mL/min Final  . GFR, Est AFR Am 11/01/2018 >60  >60 mL/min Final  . Anion gap 11/01/2018 10  5 - 15 Final   Performed at [facility], [place] [address], [place], [place] [address]    (this displays the last labs from the last 3 days)  No results found for: TOTALPROTELP, ALBUMINELP, A1GS, A2GS, BETS, BETA2SER, GAMS, MSPIKE, SPEI (this displays SPEP labs)  No results found for: KPAFRELGTCHN, LAMBDASER, KAPLAMBRATIO (kappa/lambda light chains)  No results found for: HGBA, HGBA2QUANT, HGBFQUANT, HGBSQUAN  (Hemoglobinopathy evaluation)   No results found for: LDH  Lab Results  Component Value Date   IRON 74 10/11/2017   IRONPCTSAT 19.2 (L) 10/11/2017   (Iron and TIBC)  Lab Results  Component Value Date   FERRITIN 123.7 10/11/2017    Urinalysis    Component Value Date/Time   COLORURINE YELLOW 12/10/2013 [place] 12/10/2013 1103   LABSPEC 1.014 12/10/2013 1103   PHURINE 5.0 12/10/2013 1103   GLUCOSEU NEGATIVE 12/10/2013 1103   HGBUR NEGATIVE 12/10/2013 1103   HGBUR negative 07/29/2009 1549   BILIRUBINUR Neg 10/22/2016 1713   KETONESUR NEGATIVE 12/10/2013 1103   PROTEINUR Neg 10/22/2016 1713   PROTEINUR NEGATIVE 12/10/2013 1103   UROBILINOGEN 0.2 10/22/2016 1713   UROBILINOGEN 0.2 12/10/2013 1103   NITRITE Neg 10/22/2016 1713   NITRITE NEGATIVE 12/10/2013 1103   LEUKOCYTESUR Negative 10/22/2016 1713     STUDIES:  Vas [place] Lower Extremity Venous (dvt)  Result Date: 11/18/2018  Lower Venous Study Indications: Follow up dvt.  Comparison Study: previous done 04/29/18 Performing Technologist: Abram Sander RVS  Examination Guidelines: A complete evaluation includes B-mode imaging, spectral Doppler, color Doppler, and power Doppler as needed of all accessible portions of each vessel. Bilateral testing is considered an integral part of a complete examination. Limited examinations for reoccurring indications may be performed as noted.  +-----+---------------+---------+-----------+----------+-------+ RIGHTCompressibilityPhasicitySpontaneityPropertiesSummary +-----+---------------+---------+-----------+----------+-------+ CFV  Full           Yes      Yes                          +-----+---------------+---------+-----------+----------+-------+   +---------+---------------+---------+-----------+----------+-------+ LEFT     CompressibilityPhasicitySpontaneityPropertiesSummary +---------+---------------+---------+-----------+----------+-------+ CFV      Full            Yes      Yes                          +---------+---------------+---------+-----------+----------+-------+ SFJ      Full                                                 +---------+---------------+---------+-----------+----------+-------+  FV Prox  Full                                                 +---------+---------------+---------+-----------+----------+-------+ FV Mid   Full                                                 +---------+---------------+---------+-----------+----------+-------+ FV DistalFull                                                 +---------+---------------+---------+-----------+----------+-------+ PFV      Full                                                 +---------+---------------+---------+-----------+----------+-------+ POP      Full           Yes      Yes                          +---------+---------------+---------+-----------+----------+-------+ PTV      Full                                                 +---------+---------------+---------+-----------+----------+-------+ PERO     Full                                                 +---------+---------------+---------+-----------+----------+-------+     Summary: Right: No evidence of common femoral vein obstruction. Left: No evidence of deep vein thrombosis in the lower extremity.  *See table(s) above for measurements and observations. Electronically signed by Curt Jews MD on 11/18/2018 at 1:45:53 PM.    Final      ELIGIBLE FOR AVAILABLE RESEARCH PROTOCOL: no   ASSESSMENT: 68 y.o. [place], [place] man with a history of left femoral vein thrombosis 04/29/2018 in the setting of metabolic syndrome and sedentary lifestyle but otherwise no obvious provoking event  (1) on rivaroxaban starting January 2020  (a) repeat Doppler ultrasonography 11/18/2018 shows DVT cleared  (b) d-dimer normal 11/01/2018  (c) ongoing lifestyle issues including overweight, lack  of exercise, with better diabetic and cholesterol control  (2) rivaroxaban changed to 10 mg daily 11/19/2018, to be continued indefinitely   PLAN: I reviewed that his situation with him and his wife.  We have an unprovoked clot, which is always a concern, and given the fact that he is still overweight, actually up about 10 pounds from the last time I saw him, and not exercising, I would put the risk of his developing another clot in the next 2 to 5 years in the 20 to 30%.  On the other hand, he is doing a good job of controlling his  diabetes and cholesterol.  He has cleared his clot by weekly Doppler.  D-dimer is normal.  I offered him the choice of stopping rivaroxaban but my recommendation was that we continue rivaroxaban at 10 mg daily.  This is half dose, with a correspondingly lower risk of bleeding, and we have good data that this prevents clot recurrence after the initial 6 months at full dose.  Given the fact that he tolerated rivaroxaban well so far, he is comfortable with this option understanding that there will always be some risk of bleeding even though that is lower than with full dose rivaroxaban  His wife wanted to know if we could consider stopping the anticoagulant at some point and I certainly think that is possible.  The goal I set for him is a weight of 180 pounds and exercise 4 to 5 days on most weeks.  I will see him again in a year to reassess  They know to call for any other issue that may develop before then.   Marsean Elkhatib, Virgie Dad, MD  11/18/18 2:45 PM Medical Oncology and Hematology [facility] [address] [place], [place] [address] Tel. [phone]    Fax. [phone]   I, Jacqualyn Posey am acting as a Education administrator for Chauncey Cruel, MD.   I, Lurline Del MD, have reviewed the above documentation for accuracy and completeness, and I agree with the above.

## 2018-11-18 ENCOUNTER — Inpatient Hospital Stay (HOSPITAL_BASED_OUTPATIENT_CLINIC_OR_DEPARTMENT_OTHER): Payer: 59 | Admitting: Oncology

## 2018-11-18 ENCOUNTER — Ambulatory Visit (HOSPITAL_COMMUNITY)
Admission: RE | Admit: 2018-11-18 | Discharge: 2018-11-18 | Disposition: A | Discharge status: Home / Self Care | Payer: 59 | Source: Ambulatory Visit | Attending: Oncology | Admitting: Oncology

## 2018-11-18 ENCOUNTER — Other Ambulatory Visit: Payer: Self-pay

## 2018-11-18 VITALS — BP 124/67 | HR 89 | Temp 98.2°F | Resp 18 | Ht 68.0 in | Wt 211.1 lb

## 2018-11-18 DIAGNOSIS — Z86718 Personal history of other venous thrombosis and embolism: Secondary | ICD-10-CM | POA: Diagnosis not present

## 2018-11-18 DIAGNOSIS — D509 Iron deficiency anemia, unspecified: Secondary | ICD-10-CM

## 2018-11-18 DIAGNOSIS — I82412 Acute embolism and thrombosis of left femoral vein: Secondary | ICD-10-CM

## 2018-11-18 DIAGNOSIS — E119 Type 2 diabetes mellitus without complications: Secondary | ICD-10-CM | POA: Diagnosis not present

## 2018-11-18 DIAGNOSIS — K760 Fatty (change of) liver, not elsewhere classified: Secondary | ICD-10-CM | POA: Diagnosis not present

## 2018-11-18 DIAGNOSIS — E785 Hyperlipidemia, unspecified: Secondary | ICD-10-CM

## 2018-11-18 DIAGNOSIS — E669 Obesity, unspecified: Secondary | ICD-10-CM | POA: Diagnosis present

## 2018-11-18 DIAGNOSIS — Z794 Long term (current) use of insulin: Secondary | ICD-10-CM

## 2018-11-18 DIAGNOSIS — E1165 Type 2 diabetes mellitus with hyperglycemia: Secondary | ICD-10-CM | POA: Diagnosis present

## 2018-11-18 DIAGNOSIS — Z7901 Long term (current) use of anticoagulants: Secondary | ICD-10-CM

## 2018-11-18 DIAGNOSIS — Z79899 Other long term (current) drug therapy: Secondary | ICD-10-CM

## 2018-11-18 DIAGNOSIS — IMO0002 Reserved for concepts with insufficient information to code with codable children: Secondary | ICD-10-CM

## 2018-11-18 DIAGNOSIS — E1140 Type 2 diabetes mellitus with diabetic neuropathy, unspecified: Secondary | ICD-10-CM | POA: Diagnosis present

## 2018-11-18 DIAGNOSIS — G4730 Sleep apnea, unspecified: Secondary | ICD-10-CM

## 2018-11-18 DIAGNOSIS — Z87891 Personal history of nicotine dependence: Secondary | ICD-10-CM

## 2018-11-18 MED ORDER — RIVAROXABAN 10 MG PO TABS: 10.0000 mg | ORAL_TABLET | Freq: Every day | ORAL | 4 refills | Status: DC

## 2018-11-18 NOTE — Progress Notes (Signed)
Lower extremity venous has been completed.   Preliminary results in CV Proc.   Abram Sander 11/18/2018 12:53 PM

## 2018-11-21 ENCOUNTER — Other Ambulatory Visit: Payer: Self-pay | Admitting: Endocrinology

## 2018-12-05 ENCOUNTER — Ambulatory Visit: Payer: 59 | Admitting: Endocrinology

## 2018-12-31 ENCOUNTER — Other Ambulatory Visit: Payer: Self-pay | Admitting: Family Medicine

## 2019-01-02 NOTE — Telephone Encounter (Signed)
E-scribed refills.  Pls schedule annual CPE and labs.

## 2019-01-03 NOTE — Telephone Encounter (Signed)
lvm to call back and schedule appt

## 2019-01-11 NOTE — Telephone Encounter (Signed)
Best number [phone] Pt called to schedule cpx   He needs before 10/1  Can pt be worked in  No openings for cpx

## 2019-01-11 NOTE — Telephone Encounter (Signed)
Correction before 11/1

## 2019-01-12 ENCOUNTER — Other Ambulatory Visit: Payer: Self-pay | Admitting: Family Medicine

## 2019-01-12 NOTE — Telephone Encounter (Signed)
Could do Mon 10/5 at 8:30am?

## 2019-01-13 NOTE — Telephone Encounter (Signed)
Someone is already in the 01/23/19 slot. I did put on hold 02/02/19 @ 12pm for him. That is the only date without having to put a cpe back to back. Left patient a message to call back and schedule appt.

## 2019-01-16 NOTE — Telephone Encounter (Signed)
Pt is scheduled for 02/02/19

## 2019-01-17 ENCOUNTER — Other Ambulatory Visit: Payer: Self-pay

## 2019-01-17 ENCOUNTER — Encounter: Payer: Self-pay | Admitting: Endocrinology

## 2019-01-17 MED ORDER — ROSUVASTATIN CALCIUM 40 MG PO TABS: 40.0000 mg | ORAL_TABLET | Freq: Every day | ORAL | 1 refills | Status: DC

## 2019-01-17 MED ORDER — METOPROLOL TARTRATE 25 MG PO TABS: 12.5000 mg | ORAL_TABLET | Freq: Two times a day (BID) | ORAL | 11 refills | Status: DC

## 2019-01-17 NOTE — Telephone Encounter (Signed)
Please advise. LOV 08/2018. Per your office note, pt was advised to f/u 3 months. Pt canceled his August appt x2 and did not reschedule. No future appt noted. He is requesting a 90 day supply. Please advise.

## 2019-01-18 ENCOUNTER — Other Ambulatory Visit: Payer: Self-pay

## 2019-01-18 MED ORDER — ROSUVASTATIN CALCIUM 40 MG PO TABS: 40.0000 mg | ORAL_TABLET | Freq: Every day | ORAL | 2 refills | Status: DC

## 2019-01-18 MED ORDER — METOPROLOL TARTRATE 25 MG PO TABS: 12.5000 mg | ORAL_TABLET | Freq: Two times a day (BID) | ORAL | 8 refills | Status: DC

## 2019-01-24 ENCOUNTER — Telehealth: Payer: Self-pay

## 2019-01-24 NOTE — Telephone Encounter (Signed)
Left detailed VM w COVID screen and back door lab info and front door info   

## 2019-01-25 ENCOUNTER — Other Ambulatory Visit: Payer: Self-pay | Admitting: Family Medicine

## 2019-01-25 DIAGNOSIS — N401 Enlarged prostate with lower urinary tract symptoms: Secondary | ICD-10-CM

## 2019-01-25 DIAGNOSIS — E1140 Type 2 diabetes mellitus with diabetic neuropathy, unspecified: Secondary | ICD-10-CM

## 2019-01-25 DIAGNOSIS — E785 Hyperlipidemia, unspecified: Secondary | ICD-10-CM

## 2019-01-25 DIAGNOSIS — E559 Vitamin D deficiency, unspecified: Secondary | ICD-10-CM

## 2019-01-25 DIAGNOSIS — E790 Hyperuricemia without signs of inflammatory arthritis and tophaceous disease: Secondary | ICD-10-CM

## 2019-01-25 DIAGNOSIS — D751 Secondary polycythemia: Secondary | ICD-10-CM

## 2019-01-25 DIAGNOSIS — IMO0002 Reserved for concepts with insufficient information to code with codable children: Secondary | ICD-10-CM

## 2019-01-25 DIAGNOSIS — N138 Other obstructive and reflux uropathy: Secondary | ICD-10-CM

## 2019-01-25 DIAGNOSIS — D508 Other iron deficiency anemias: Secondary | ICD-10-CM

## 2019-01-26 ENCOUNTER — Other Ambulatory Visit: Payer: Self-pay

## 2019-01-26 ENCOUNTER — Other Ambulatory Visit (INDEPENDENT_AMBULATORY_CARE_PROVIDER_SITE_OTHER): Payer: 59

## 2019-01-26 DIAGNOSIS — N138 Other obstructive and reflux uropathy: Secondary | ICD-10-CM | POA: Diagnosis not present

## 2019-01-26 DIAGNOSIS — E1165 Type 2 diabetes mellitus with hyperglycemia: Secondary | ICD-10-CM

## 2019-01-26 DIAGNOSIS — D508 Other iron deficiency anemias: Secondary | ICD-10-CM

## 2019-01-26 DIAGNOSIS — N401 Enlarged prostate with lower urinary tract symptoms: Secondary | ICD-10-CM | POA: Diagnosis not present

## 2019-01-26 DIAGNOSIS — E785 Hyperlipidemia, unspecified: Secondary | ICD-10-CM | POA: Diagnosis not present

## 2019-01-26 DIAGNOSIS — E790 Hyperuricemia without signs of inflammatory arthritis and tophaceous disease: Secondary | ICD-10-CM | POA: Diagnosis not present

## 2019-01-26 DIAGNOSIS — E559 Vitamin D deficiency, unspecified: Secondary | ICD-10-CM | POA: Diagnosis not present

## 2019-01-26 DIAGNOSIS — E1140 Type 2 diabetes mellitus with diabetic neuropathy, unspecified: Secondary | ICD-10-CM

## 2019-01-26 DIAGNOSIS — IMO0002 Reserved for concepts with insufficient information to code with codable children: Secondary | ICD-10-CM

## 2019-01-26 LAB — CBC WITH DIFFERENTIAL/PLATELET
Basophils Absolute: 0.1 10*3/uL (ref 0.0–0.1)
Basophils Relative: 0.8 % (ref 0.0–3.0)
Eosinophils Absolute: 0.2 10*3/uL (ref 0.0–0.7)
Eosinophils Relative: 3 % (ref 0.0–5.0)
HCT: 44.5 % (ref 39.0–52.0)
Hemoglobin: 15.2 g/dL (ref 13.0–17.0)
Lymphocytes Relative: 22.1 % (ref 12.0–46.0)
Lymphs Abs: 1.5 10*3/uL (ref 0.7–4.0)
MCHC: 34.1 g/dL (ref 30.0–36.0)
MCV: 89.3 fl (ref 78.0–100.0)
Monocytes Absolute: 0.4 10*3/uL (ref 0.1–1.0)
Monocytes Relative: 6.2 % (ref 3.0–12.0)
Neutro Abs: 4.6 10*3/uL (ref 1.4–7.7)
Neutrophils Relative %: 67.9 % (ref 43.0–77.0)
Platelets: 222 10*3/uL (ref 150.0–400.0)
RBC: 4.99 Mil/uL (ref 4.22–5.81)
RDW: 14.2 % (ref 11.5–15.5)
WBC: 6.7 10*3/uL (ref 4.0–10.5)

## 2019-01-26 LAB — COMPREHENSIVE METABOLIC PANEL
ALT: 31 U/L (ref 0–53)
AST: 29 U/L (ref 0–37)
Albumin: 4.3 g/dL (ref 3.5–5.2)
Alkaline Phosphatase: 45 U/L (ref 39–117)
BUN: 24 mg/dL — ABNORMAL HIGH (ref 6–23)
CO2: 24 mEq/L (ref 19–32)
Calcium: 10 mg/dL (ref 8.4–10.5)
Chloride: 107 mEq/L (ref 96–112)
Creatinine, Ser: 1.16 mg/dL (ref 0.40–1.50)
GFR: 62.68 mL/min (ref >60.00)
Glucose, Bld: 71 mg/dL (ref 70–99)
Potassium: 4.4 mEq/L (ref 3.5–5.1)
Sodium: 141 mEq/L (ref 135–145)
Total Bilirubin: 0.7 mg/dL (ref 0.2–1.2)
Total Protein: 6.7 g/dL (ref 6.0–8.3)

## 2019-01-26 LAB — LIPID PANEL
Cholesterol: 94 mg/dL (ref 0–200)
HDL: 29.9 mg/dL — ABNORMAL LOW (ref >39.00)
LDL Cholesterol: 47 mg/dL (ref 0–99)
NonHDL: 64.1
Total CHOL/HDL Ratio: 3
Triglycerides: 85 mg/dL (ref 0.0–149.0)
VLDL: 17 mg/dL (ref 0.0–40.0)

## 2019-01-26 LAB — URIC ACID: Uric Acid, Serum: 5.9 mg/dL (ref 4.0–7.8)

## 2019-01-26 LAB — PSA: PSA: 0.65 ng/mL (ref 0.10–4.00)

## 2019-01-26 LAB — FERRITIN: Ferritin: 97.8 ng/mL (ref 22.0–322.0)

## 2019-01-26 LAB — VITAMIN D 25 HYDROXY (VIT D DEFICIENCY, FRACTURES): VITD: 39.78 ng/mL (ref 30.00–100.00)

## 2019-01-26 LAB — HEMOGLOBIN A1C: Hgb A1c MFr Bld: 7.2 % — ABNORMAL HIGH (ref 4.6–6.5)

## 2019-02-01 ENCOUNTER — Other Ambulatory Visit: Payer: Self-pay

## 2019-02-01 DIAGNOSIS — Z20822 Contact with and (suspected) exposure to covid-19: Secondary | ICD-10-CM

## 2019-02-02 ENCOUNTER — Encounter: Payer: Self-pay | Admitting: Family Medicine

## 2019-02-02 ENCOUNTER — Other Ambulatory Visit: Payer: Self-pay

## 2019-02-02 ENCOUNTER — Ambulatory Visit (INDEPENDENT_AMBULATORY_CARE_PROVIDER_SITE_OTHER): Payer: 59 | Admitting: Family Medicine

## 2019-02-02 VITALS — Temp 97.8°F | Ht 68.0 in | Wt 213.0 lb

## 2019-02-02 DIAGNOSIS — E1169 Type 2 diabetes mellitus with other specified complication: Secondary | ICD-10-CM

## 2019-02-02 DIAGNOSIS — IMO0002 Reserved for concepts with insufficient information to code with codable children: Secondary | ICD-10-CM

## 2019-02-02 DIAGNOSIS — Z0000 Encounter for general adult medical examination without abnormal findings: Secondary | ICD-10-CM

## 2019-02-02 DIAGNOSIS — E669 Obesity, unspecified: Secondary | ICD-10-CM

## 2019-02-02 DIAGNOSIS — Z7189 Other specified counseling: Secondary | ICD-10-CM | POA: Diagnosis not present

## 2019-02-02 DIAGNOSIS — I2510 Atherosclerotic heart disease of native coronary artery without angina pectoris: Secondary | ICD-10-CM

## 2019-02-02 DIAGNOSIS — I10 Essential (primary) hypertension: Secondary | ICD-10-CM

## 2019-02-02 DIAGNOSIS — N138 Other obstructive and reflux uropathy: Secondary | ICD-10-CM

## 2019-02-02 DIAGNOSIS — E790 Hyperuricemia without signs of inflammatory arthritis and tophaceous disease: Secondary | ICD-10-CM

## 2019-02-02 DIAGNOSIS — E785 Hyperlipidemia, unspecified: Secondary | ICD-10-CM

## 2019-02-02 DIAGNOSIS — G4730 Sleep apnea, unspecified: Secondary | ICD-10-CM

## 2019-02-02 DIAGNOSIS — E1165 Type 2 diabetes mellitus with hyperglycemia: Secondary | ICD-10-CM

## 2019-02-02 DIAGNOSIS — I82412 Acute embolism and thrombosis of left femoral vein: Secondary | ICD-10-CM

## 2019-02-02 DIAGNOSIS — N401 Enlarged prostate with lower urinary tract symptoms: Secondary | ICD-10-CM

## 2019-02-02 DIAGNOSIS — D508 Other iron deficiency anemias: Secondary | ICD-10-CM

## 2019-02-02 DIAGNOSIS — E559 Vitamin D deficiency, unspecified: Secondary | ICD-10-CM

## 2019-02-02 DIAGNOSIS — Z87891 Personal history of nicotine dependence: Secondary | ICD-10-CM

## 2019-02-02 DIAGNOSIS — E1140 Type 2 diabetes mellitus with diabetic neuropathy, unspecified: Secondary | ICD-10-CM

## 2019-02-02 LAB — NOVEL CORONAVIRUS, NAA: SARS-CoV-2, NAA: NOT DETECTED

## 2019-02-02 NOTE — Assessment & Plan Note (Signed)
Chronic, reviewed with patient great control on crestor - continue. The ASCVD Risk score Mikey Bussing DC Jr., et al., 2013) failed to calculate for the following reasons:   The valid total cholesterol range is 130 to 320 mg/dL

## 2019-02-02 NOTE — Assessment & Plan Note (Signed)
Quit remotely 

## 2019-02-02 NOTE — Assessment & Plan Note (Signed)
Sees cardiology 

## 2019-02-02 NOTE — Progress Notes (Signed)
Virtual visit attempted through [facility] but failed due to pt technical difficulties . Video visit completed through [facility].MeDue to national recommendations of social distancing due to COVID-19, a virtual visit is felt to be most appropriate for this patient at this time. Reviewed limitations of a virtual visit.   Patient location: home Provider location: [facility], office If any vitals were documented, they were collected by patient at home unless specified below.    Temp 97.8 F (36.6 C)   Ht 5\' 8"  (1.727 m)   Wt 213 lb (96.6 kg)   BMI 32.39 kg/m   Will return for nurse visit for BP check.   CC: CPE Subjective:    Patient ID: Alan Morrison, male    DOB: 10-Dec-1950, 68 y.o.   MRN: [id number]  HPI: Alan Morrison is a 68 y.o. male presenting on 02/02/2019 for Annual Exam   Received medicare part B Sept 1st 2020 - current insurance ends at end of month. Retired 01/20/2019.   Physical converted to virtual visit due to possible exposure to covid (daughter's best friend). He had test done yesterday and is awaiting results.   Saw Dr Jana Hakim 10/2018, note reviewed. Plan indefinite xarelto 10mg  daily unless significant weight loss and regular exercise routine. Planned yearly check with heme. He finds med unaffordable - concerned with insurance change 02/18/2019.   Saw cards Dr Martinique 10/2018, note reviewed. Nonobstructive CAD by cath 2003 and again 2018. On aspirin beta blocker and crestor.   DM followed by endo. Sugars averaging <150.   Preventative: COLONOSCOPY Date: 07/2013 1 benign polyp rpt 10 yrs [name])  Prostate -normal in the past.Would like to continue checking yearly.  Flu shot yearly at work  Pneumovax 2013, prevnar 2018. Tdap 2016 Zostavax- 08/2013 Shingrix - completed series 2019 Advanced planning discussion. Has at home. Wife is HCPOA. Asked to bring [place] a copy.  Seat belt use discussed.  Sunscreen use discussed. No changing moles. Ex smoker - quit  remotely Alcohol - occasional beer  Dentist - Q27mo  Eye exam - yearly   Lives with wife  Occupation: equipment maintenance  Activity:walking - limited by knees Diet: good water, daily fruits/vegetables, diet drinks     Relevant past medical, surgical, family and social history reviewed and updated as indicated. Interim medical history since our last visit reviewed. Allergies and medications reviewed and updated. Outpatient Medications Prior to Visit  Medication Sig Dispense Refill  . Cholecalciferol (VITAMIN D) 2000 units tablet Take 2,000 Units by mouth daily.    . finasteride (PROSCAR) 5 MG tablet TAKE 1 TABLET BY MOUTH  DAILY 90 tablet 0  . glucose blood (ONE TOUCH ULTRA TEST) test strip Check sugars daily 100 each 3  . Insulin Glargine (LANTUS SOLOSTAR) 100 UNIT/ML Solostar Pen Inject 75 Units into the skin at bedtime. 25 pen PRN  . insulin lispro (HUMALOG KWIKPEN) 100 UNIT/ML KwikPen INJECT 3 TIMES A DAY (JUST BEFORE EACH MEAL) 25-15-25 UNITS. 15 mL 1  . Insulin Pen Needle (B-D ULTRAFINE III SHORT PEN) 31G X 8 MM MISC 1 Device by Other route 4 (four) times daily. USE AS DIRECTED DAILY 120 each 11  . losartan (COZAAR) 25 MG tablet TAKE 1 TABLET BY MOUTH EVERY DAY 30 tablet 1  . metFORMIN (GLUCOPHAGE) 1000 MG tablet TAKE 1 TABLET BY MOUTH  TWICE DAILY 180 tablet 0  . metoprolol tartrate (LOPRESSOR) 25 MG tablet Take 0.5 tablets (12.5 mg total) by mouth 2 (two) times daily. 30 tablet 8  .  Multiple Vitamin (MULTIVITAMIN WITH MINERALS) TABS tablet Take 1 tablet by mouth daily.    . probenecid (BENEMID) 500 MG tablet TAKE ONE-HALF TABLET BY  MOUTH DAILY 45 tablet 0  . rivaroxaban (XARELTO) 10 MG TABS tablet Take 1 tablet (10 mg total) by mouth daily. 90 tablet 4  . rosuvastatin (CRESTOR) 40 MG tablet Take 1 tablet (40 mg total) by mouth daily. <PLEASE KEEP APPOINTMENT > 90 tablet 2  . tamsulosin (FLOMAX) 0.4 MG CAPS capsule TAKE 1 CAPSULE BY MOUTH  DAILY 90 capsule 0  . vitamin C  (ASCORBIC ACID) 500 MG tablet Take 500 mg by mouth every evening.     . ferrous sulfate 325 (65 FE) MG tablet Take 1 tablet (325 mg total) by mouth daily as needed (fatigue).  3   No facility-administered medications prior to visit.      Per HPI unless specifically indicated in ROS section below Review of Systems  Constitutional: Negative for activity change, appetite change, chills, fatigue, fever and unexpected weight change.  HENT: Negative for hearing loss.   Eyes: Negative for visual disturbance.  Respiratory: Negative for cough, chest tightness, shortness of breath and wheezing.   Cardiovascular: Negative for chest pain, palpitations and leg swelling.  Gastrointestinal: Negative for abdominal distention, abdominal pain, blood in stool, constipation, diarrhea, nausea and vomiting.  Genitourinary: Negative for difficulty urinating and hematuria.  Musculoskeletal: Negative for arthralgias, myalgias and neck pain.  Skin: Negative for rash.  Neurological: Negative for dizziness, seizures, syncope and headaches.  Hematological: Negative for adenopathy. Does not bruise/bleed easily.  Psychiatric/Behavioral: Negative for dysphoric mood. The patient is not nervous/anxious.    Objective:    Temp 97.8 F (36.6 C)   Ht 5\' 8"  (1.727 m)   Wt 213 lb (96.6 kg)   BMI 32.39 kg/m   Wt Readings from Last 3 Encounters:  02/02/19 213 lb (96.6 kg)  11/18/18 211 lb 1.6 oz (95.8 kg)  10/31/18 215 lb (97.5 kg)     Physical exam: Gen: alert, NAD, not ill appearing Pulm: speaks in complete sentences without increased work of breathing Psych: normal mood, normal thought content      Results for orders placed or performed in visit on 01/26/19  Uric acid  Result Value Ref Range   Uric Acid, Serum 5.9 4.0 - 7.8 mg/dL  CBC with Differential/Platelet  Result Value Ref Range   WBC 6.7 4.0 - 10.5 K/uL   RBC 4.99 4.22 - 5.81 Mil/uL   Hemoglobin 15.2 13.0 - 17.0 g/dL   HCT 44.5 39.0 - 52.0 %   MCV  89.3 78.0 - 100.0 fl   MCHC 34.1 30.0 - 36.0 g/dL   RDW 14.2 11.5 - 15.5 %   Platelets 222.0 150.0 - 400.0 K/uL   Neutrophils Relative % 67.9 43.0 - 77.0 %   Lymphocytes Relative 22.1 12.0 - 46.0 %   Monocytes Relative 6.2 3.0 - 12.0 %   Eosinophils Relative 3.0 0.0 - 5.0 %   Basophils Relative 0.8 0.0 - 3.0 %   Neutro Abs 4.6 1.4 - 7.7 K/uL   Lymphs Abs 1.5 0.7 - 4.0 K/uL   Monocytes Absolute 0.4 0.1 - 1.0 K/uL   Eosinophils Absolute 0.2 0.0 - 0.7 K/uL   Basophils Absolute 0.1 0.0 - 0.1 K/uL  PSA  Result Value Ref Range   PSA 0.65 0.10 - 4.00 ng/mL  Hemoglobin A1c  Result Value Ref Range   Hgb A1c MFr Bld 7.2 (H) 4.6 - 6.5 %  Ferritin  Result Value Ref Range   Ferritin 97.8 22.0 - 322.0 ng/mL  Comprehensive metabolic panel  Result Value Ref Range   Sodium 141 135 - 145 mEq/L   Potassium 4.4 3.5 - 5.1 mEq/L   Chloride 107 96 - 112 mEq/L   CO2 24 19 - 32 mEq/L   Glucose, Bld 71 70 - 99 mg/dL   BUN 24 (H) 6 - 23 mg/dL   Creatinine, Ser 1.16 0.40 - 1.50 mg/dL   Total Bilirubin 0.7 0.2 - 1.2 mg/dL   Alkaline Phosphatase 45 39 - 117 U/L   AST 29 0 - 37 U/L   ALT 31 0 - 53 U/L   Total Protein 6.7 6.0 - 8.3 g/dL   Albumin 4.3 3.5 - 5.2 g/dL   Calcium 10.0 8.4 - 10.5 mg/dL   GFR 62.68 >60.00 mL/min  Lipid panel  Result Value Ref Range   Cholesterol 94 0 - 200 mg/dL   Triglycerides 85.0 0.0 - 149.0 mg/dL   HDL 29.90 (L) >39.00 mg/dL   VLDL 17.0 0.0 - 40.0 mg/dL   LDL Cholesterol 47 0 - 99 mg/dL   Total CHOL/HDL Ratio 3    NonHDL 64.10   VITAMIN D 25 Hydroxy (Vit-D Deficiency, Fractures)  Result Value Ref Range   VITD 39.78 30.00 - 100.00 ng/mL   Assessment & Plan:  I encouraged he return for welcome to medicare visit when he gets medicare.  Problem List Items Addressed This Visit    Vitamin D deficiency    Continue vitD 2000 IU daily.      Type 2 diabetes, uncontrolled, with neuropathy (HCC)    Chronic, improving. Followed by endo. Some low sugars on recent labs -  he will monitor more closely at home and let endo know if hypoglycemia develops.       Sleep apnea    Unclear history - will need to further review next visit.       Obesity, Class I, BMI 30-34.9    Discussed mild weight gain noted- rec continued healthy diet and lifestyle changes to affect sustainable weight loss.       Hyperuricemia    No gout flares in years. He is on 1/2 probenecid daily for years. Advised QOD dosing and if doing well, to stop probenecid. He also takes vit C 500mg  daily.       Hyperlipidemia associated with type 2 diabetes mellitus (HCC)    Chronic, reviewed with patient great control on crestor - continue. The ASCVD Risk score Mikey Bussing DC Jr., et al., 2013) failed to calculate for the following reasons:   The valid total cholesterol range is 130 to 320 mg/dL       Health maintenance examination - Primary    Preventative protocols reviewed and updated unless pt declined. Discussed healthy diet and lifestyle.       Ex-smoker    Quit remotely.      Essential hypertension, benign    Chronic. Continue current regimen. He will return for nurse visit for BP check.      DVT of deep femoral vein, left (HCC)    Appreciate heme care - now on xarelto 10mg  daily. Concerned with cost of med especially with upcoming insurance change.       CAD (coronary artery disease)    Sees cardiology.       Benign prostatic hyperplasia with urinary obstruction    Continues flomax and proscar. PSA stable (value halved due to finasteride use).  Anemia, iron deficiency, inadequate dietary intake    Ferritin normal. CBC normal.  This has resolved. rec stop OTC iron.       Advanced directives, counseling/discussion    Advanced planning discussion. Has at home. Wife is HCPOA. Asked to bring [place] a copy.           No orders of the defined types were placed in this encounter.  No orders of the defined types were placed in this encounter.   I discussed the assessment  and treatment plan with the patient. The patient was provided an opportunity to ask questions and all were answered. The patient agreed with the plan and demonstrated an understanding of the instructions. The patient was advised to call back or seek an in-person evaluation if the symptoms worsen or if the condition fails to improve as anticipated.  Follow up plan: No follow-ups on file.  [name], MD

## 2019-02-02 NOTE — Assessment & Plan Note (Signed)
Appreciate heme care - now on xarelto 10mg  daily. Concerned with cost of med especially with upcoming insurance change.

## 2019-02-02 NOTE — Assessment & Plan Note (Signed)
No gout flares in years. He is on 1/2 probenecid daily for years. Advised QOD dosing and if doing well, to stop probenecid. He also takes vit C 500mg  daily.

## 2019-02-02 NOTE — Assessment & Plan Note (Signed)
Continue vit D 2000 IU daily.  

## 2019-02-02 NOTE — Telephone Encounter (Signed)
Pt had MyChart visit today.

## 2019-02-02 NOTE — Assessment & Plan Note (Signed)
Discussed mild weight gain noted- rec continued healthy diet and lifestyle changes to affect sustainable weight loss.

## 2019-02-02 NOTE — Assessment & Plan Note (Signed)
Preventative protocols reviewed and updated unless pt declined. Discussed healthy diet and lifestyle.  

## 2019-02-02 NOTE — Assessment & Plan Note (Addendum)
Ferritin normal. CBC normal.  This has resolved. rec stop OTC iron.

## 2019-02-02 NOTE — Assessment & Plan Note (Signed)
Advanced planning discussion. Has at home. Wife is HCPOA. Asked to bring [place] a copy.

## 2019-02-02 NOTE — Assessment & Plan Note (Signed)
Continues flomax and proscar. PSA stable (value halved due to finasteride use).

## 2019-02-02 NOTE — Assessment & Plan Note (Signed)
Chronic, improving. Followed by endo. Some low sugars on recent labs - he will monitor more closely at home and let endo know if hypoglycemia develops.

## 2019-02-02 NOTE — Assessment & Plan Note (Signed)
Unclear history - will need to further review next visit.

## 2019-02-02 NOTE — Assessment & Plan Note (Signed)
Chronic. Continue current regimen. He will return for nurse visit for BP check.

## 2019-02-06 ENCOUNTER — Encounter: Payer: Self-pay | Admitting: Endocrinology

## 2019-02-06 ENCOUNTER — Other Ambulatory Visit: Payer: Self-pay

## 2019-02-06 ENCOUNTER — Ambulatory Visit (INDEPENDENT_AMBULATORY_CARE_PROVIDER_SITE_OTHER): Payer: 59 | Admitting: Endocrinology

## 2019-02-06 VITALS — Ht 68.0 in

## 2019-02-06 DIAGNOSIS — E1165 Type 2 diabetes mellitus with hyperglycemia: Secondary | ICD-10-CM

## 2019-02-06 DIAGNOSIS — E1140 Type 2 diabetes mellitus with diabetic neuropathy, unspecified: Secondary | ICD-10-CM | POA: Diagnosis not present

## 2019-02-06 DIAGNOSIS — IMO0002 Reserved for concepts with insufficient information to code with codable children: Secondary | ICD-10-CM

## 2019-02-06 MED ORDER — INSULIN LISPRO (1 UNIT DIAL) 100 UNIT/ML (KWIKPEN): PEN_INJECTOR | SUBCUTANEOUS | 1 refills | Status: DC

## 2019-02-06 NOTE — Progress Notes (Signed)
Subjective:    Patient ID: Alan Morrison, male    DOB: [birth year], 68 y.o.   MRN: [id number]  HPI telehealth visit today via doxy video visit.  Alternatives to telehealth are presented to this patient, and the patient agrees to the telehealth visit. Pt is advised of the cost of the visit, and agrees to this, also.   Patient is at home, and I am at the office.   Persons attending the telehealth visit: the patient and I Pt returns for f/u of diabetes mellitus: DM type: Insulin-requiring type 2.  Dx'ed: [id number] Complications: polyneuropathy, renal insuff, and CAD Therapy: insulin since early 2018, and metformin.   DKA: never Severe hypoglycemia: never.   Pancreatitis: never.   Pancreatic imaging: never.  Other: he takes multiple daily injections.  Interval history: pt says cbg's vary from 71-195.  It is in general highest in the afternoon.  pt states he feels well in general.  He says humalog is 25 units 3 times a day (just before each meal).   Past Medical History:  Diagnosis Date  . Anemia, iron deficiency, inadequate dietary intake   . 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   by [place]  . CAD (coronary artery disease) 2003   cath - Min Dz, EF 65%, Adm.R/O'd  . Cervical spondylosis without myelopathy   . Chest pain, atypical   . Choroidal nevus 01/22/2011   left, yearly eye exam, no diabetic retinopathy  . Diabetes mellitus type II   . Dyspepsia   . Ex-smoker   . Fatty liver 02/2016   by [place]  . GERD (gastroesophageal reflux disease)   . Headache(784.0)   . Heart murmur    Hx of  . History of MRI of brain and brain stem 09/06  . History of MRSA infection 12/2013   R knee boil  . HLD (hyperlipidemia)   . Hyperuricemia   . Obesity   . Sleep apnea   . Stress-induced cardiomyopathy 09/27/98   WNL, EF 64%  . Urosepsis 01/01-01/05/10   Hospitalization    Past Surgical History:  Procedure Laterality Date  . COLONOSCOPY  07/2013   1 benign polyp rpt 10 yrs [name])  . KNEE  ARTHROSCOPY Left 1988  . KNEE SURGERY Right 05/21/03   Right, Dr. Marlou Sa, med meniscus tear via MRI  . LEFT HEART CATH AND CORONARY ANGIOGRAPHY N/A 04/01/2017   Procedure: LEFT HEART CATH AND CORONARY ANGIOGRAPHY;  Surgeon: Martinique, Peter M, MD;  Location: [facility];  Service: Cardiovascular;  Laterality: N/A;  . MYELOGRAM  08/05   bulging disc    Social History   Socioeconomic History  . Marital status: Married    Spouse name: Not on file  . Number of children: 2  . Years of education: Not on file  . Highest education level: Not on file  Occupational History  . Occupation: Maintenance    Employer: [employer]  Social Needs  . Financial resource strain: Not on file  . Food insecurity    Worry: Not on file    Inability: Not on file  . Transportation needs    Medical: Not on file    Non-medical: Not on file  Tobacco Use  . Smoking status: Former Smoker    Quit date: 04/20/1974    Years since quitting: 44.8  . Smokeless tobacco: Never Used  . Tobacco comment: quit over 20 years  Substance and Sexual Activity  . Alcohol use: Yes    Alcohol/week: 3.0 standard  drinks    Types: 3 Cans of beer per week    Comment: occasional  . Drug use: No  . Sexual activity: Not on file  Lifestyle  . Physical activity    Days per week: Not on file    Minutes per session: Not on file  . Stress: Not on file  Relationships  . Social Herbalist on phone: Not on file    Gets together: Not on file    Attends religious service: Not on file    Active member of club or organization: Not on file    Attends meetings of clubs or organizations: Not on file    Relationship status: Not on file  . Intimate partner violence    Fear of current or ex partner: Not on file    Emotionally abused: Not on file    Physically abused: Not on file    Forced sexual activity: Not on file  Other Topics Concern  . Not on file  Social History Narrative   Lives with wife   Occupation:  equipment maintenance   Activity: no regular exercise - hunts   Diet: good water, fruits/vegetables    Current Outpatient Medications on File Prior to Visit  Medication Sig Dispense Refill  . Cholecalciferol (VITAMIN D) 2000 units tablet Take 2,000 Units by mouth daily.    . finasteride (PROSCAR) 5 MG tablet TAKE 1 TABLET BY MOUTH  DAILY 90 tablet 0  . glucose blood (ONE TOUCH ULTRA TEST) test strip Check sugars daily 100 each 3  . Insulin Glargine (LANTUS SOLOSTAR) 100 UNIT/ML Solostar Pen Inject 75 Units into the skin at bedtime. 25 pen PRN  . Insulin Pen Needle (B-D ULTRAFINE III SHORT PEN) 31G X 8 MM MISC 1 Device by Other route 4 (four) times daily. USE AS DIRECTED DAILY 120 each 11  . losartan (COZAAR) 25 MG tablet TAKE 1 TABLET BY MOUTH EVERY DAY 30 tablet 1  . metFORMIN (GLUCOPHAGE) 1000 MG tablet TAKE 1 TABLET BY MOUTH  TWICE DAILY 180 tablet 0  . metoprolol tartrate (LOPRESSOR) 25 MG tablet Take 0.5 tablets (12.5 mg total) by mouth 2 (two) times daily. 30 tablet 8  . Multiple Vitamin (MULTIVITAMIN WITH MINERALS) TABS tablet Take 1 tablet by mouth daily.    . probenecid (BENEMID) 500 MG tablet TAKE ONE-HALF TABLET BY  MOUTH DAILY 45 tablet 0  . rivaroxaban (XARELTO) 10 MG TABS tablet Take 1 tablet (10 mg total) by mouth daily. 90 tablet 4  . rosuvastatin (CRESTOR) 40 MG tablet Take 1 tablet (40 mg total) by mouth daily. <PLEASE KEEP APPOINTMENT > 90 tablet 2  . tamsulosin (FLOMAX) 0.4 MG CAPS capsule TAKE 1 CAPSULE BY MOUTH  DAILY 90 capsule 0  . vitamin C (ASCORBIC ACID) 500 MG tablet Take 500 mg by mouth every evening.      No current facility-administered medications on file prior to visit.     Allergies  Allergen Reactions  . Victoza [Liraglutide] Nausea Only    Irritation around injection site    Family History  Problem Relation Age of Onset  . Heart disease Mother        CHF  . Diabetes Mother   . Heart disease Father        CHF  . Hypertension Father   .  Migraines Brother        severe headaches from arsenic in the past from  wood that was treated on his deck  .  CAD Brother 30       stent  . Cancer Maternal Uncle        unsure  . Stroke Neg Hx   . Colon cancer Neg Hx     Ht 5\' 8"  (1.727 m)   BMI 32.39 kg/m   Review of Systems He denies hypoglycemia.      Objective:   Physical Exam    Lab Results  Component Value Date   CREATININE 1.16 01/26/2019   BUN 24 (H) 01/26/2019   NA 141 01/26/2019   K 4.4 01/26/2019   CL 107 01/26/2019   CO2 24 01/26/2019    Lab Results  Component Value Date   HGBA1C 7.2 (H) 01/26/2019       Assessment & Plan:  Insulin-requiring type 2 DM, with CAD: he needs increased rx, if it can be done with a regimen that avoids or minimizes hypoglycemia. Renal insuff: in this setting, he needs mostly mealtime insulin.   Patient Instructions  Please increase the humalog to 3 times a day (just before each meal) 25-28-25 units, and:  Please continue the same Lantus.   check your blood sugar twice a day.  vary the time of day when you check, between before the 3 meals, and at bedtime.  also check if you have symptoms of your blood sugar being too high or too low.  please keep a record of the readings and bring it to your next appointment here (or you can bring the meter itself).  You can write it on any piece of paper.  please call us sooner if your blood sugar goes below 70, or if you have a lot of readings over 200.   Please come back for a follow-up appointment in 3 months.

## 2019-02-06 NOTE — Patient Instructions (Addendum)
Please increase the humalog to 3 times a day (just before each meal) 25-28-25 units, and:  Please continue the same Lantus.   check your blood sugar twice a day.  vary the time of day when you check, between before the 3 meals, and at bedtime.  also check if you have symptoms of your blood sugar being too high or too low.  please keep a record of the readings and bring it to your next appointment here (or you can bring the meter itself).  You can write it on any piece of paper.  please call us sooner if your blood sugar goes below 70, or if you have a lot of readings over 200.   Please come back for a follow-up appointment in 3 months.

## 2019-03-10 ENCOUNTER — Other Ambulatory Visit: Payer: Self-pay | Admitting: Family Medicine

## 2019-03-28 DIAGNOSIS — R69 Illness, unspecified: Secondary | ICD-10-CM | POA: Diagnosis not present

## 2019-04-08 ENCOUNTER — Other Ambulatory Visit: Payer: Self-pay | Admitting: Family Medicine

## 2019-05-11 ENCOUNTER — Other Ambulatory Visit: Payer: Self-pay | Admitting: Cardiology

## 2019-05-17 ENCOUNTER — Telehealth: Payer: Self-pay

## 2019-05-17 NOTE — Telephone Encounter (Signed)
LMTCB to schedule.

## 2019-05-17 NOTE — Telephone Encounter (Signed)
Received notification from [facility] stating Humalog Claiborne Rigg is no longer covered as a formulary on pt plan. Provided Dr. Loanne Drilling with this letter. He has advised pt is in need of appt to further discuss. Routing this message to the front desk for scheduling purposes.

## 2019-05-19 NOTE — Telephone Encounter (Signed)
LMTCB to schedule.

## 2019-05-23 ENCOUNTER — Encounter: Payer: Self-pay | Admitting: Family Medicine

## 2019-05-23 NOTE — Telephone Encounter (Signed)
Left voicemail for patient. Still needs to schedule AWV and labs.

## 2019-05-23 NOTE — Telephone Encounter (Signed)
Plz schedule wellness and lab visits.

## 2019-05-24 ENCOUNTER — Encounter: Payer: Self-pay | Admitting: Endocrinology

## 2019-05-24 NOTE — Telephone Encounter (Signed)
Called pt and attempted to schedule him for a visit. Pt denied at this time because he has an upcoming visit with his PCP. Pt did wish to let Dr. Loanne Drilling know that he has been having some issues with his blood sugars and the pt has attempted to adjust insulin himself with no success. Pt was informed that this is all the more reason to schedule an appt with Dr. Loanne Drilling because he is the pt's endocrinologist. Pt stated that the only medication he gets from Dr. Loanne Drilling is insulin. He stated that his PCP and Dr. Loanne Drilling are treating his DM because Dr. Loanne Drilling "won't give him the medication that he wants". Pt was informed to call this office when he wishes to get scheduled and receive assistant.

## 2019-05-24 NOTE — Telephone Encounter (Signed)
Please review and advise how you wish to proceed.

## 2019-05-24 NOTE — Telephone Encounter (Signed)
Given his request to decline to schedule, did you want a letter sent?

## 2019-05-24 NOTE — Telephone Encounter (Signed)
I did d/c letter and form.  Thank you.

## 2019-05-24 NOTE — Telephone Encounter (Signed)
Pt is overdue for appt.  If he declines to f/u here, I can't help him.

## 2019-05-24 NOTE — Telephone Encounter (Signed)
Spoke to pt. Made AWV appt 05-31-19 and lab appt 05-26-19.

## 2019-05-25 NOTE — Telephone Encounter (Signed)
FYI

## 2019-05-26 ENCOUNTER — Other Ambulatory Visit: Payer: Self-pay

## 2019-05-26 ENCOUNTER — Other Ambulatory Visit: Payer: Self-pay | Admitting: Family Medicine

## 2019-05-26 ENCOUNTER — Other Ambulatory Visit (INDEPENDENT_AMBULATORY_CARE_PROVIDER_SITE_OTHER): Payer: Medicare HMO

## 2019-05-26 DIAGNOSIS — IMO0002 Reserved for concepts with insufficient information to code with codable children: Secondary | ICD-10-CM

## 2019-05-26 DIAGNOSIS — E1140 Type 2 diabetes mellitus with diabetic neuropathy, unspecified: Secondary | ICD-10-CM

## 2019-05-26 DIAGNOSIS — E790 Hyperuricemia without signs of inflammatory arthritis and tophaceous disease: Secondary | ICD-10-CM

## 2019-05-26 DIAGNOSIS — E1169 Type 2 diabetes mellitus with other specified complication: Secondary | ICD-10-CM | POA: Diagnosis not present

## 2019-05-26 DIAGNOSIS — E785 Hyperlipidemia, unspecified: Secondary | ICD-10-CM

## 2019-05-26 DIAGNOSIS — E1165 Type 2 diabetes mellitus with hyperglycemia: Secondary | ICD-10-CM

## 2019-05-26 DIAGNOSIS — E559 Vitamin D deficiency, unspecified: Secondary | ICD-10-CM

## 2019-05-26 DIAGNOSIS — D508 Other iron deficiency anemias: Secondary | ICD-10-CM | POA: Diagnosis not present

## 2019-05-26 DIAGNOSIS — N138 Other obstructive and reflux uropathy: Secondary | ICD-10-CM

## 2019-05-26 LAB — LIPID PANEL
Cholesterol: 92 mg/dL (ref 0–200)
HDL: 28.3 mg/dL — ABNORMAL LOW (ref >39.00)
LDL Cholesterol: 42 mg/dL (ref 0–99)
NonHDL: 64
Total CHOL/HDL Ratio: 3
Triglycerides: 112 mg/dL (ref 0.0–149.0)
VLDL: 22.4 mg/dL (ref 0.0–40.0)

## 2019-05-26 LAB — CBC WITH DIFFERENTIAL/PLATELET
Basophils Absolute: 0.1 10*3/uL (ref 0.0–0.1)
Basophils Relative: 0.8 % (ref 0.0–3.0)
Eosinophils Absolute: 0.2 10*3/uL (ref 0.0–0.7)
Eosinophils Relative: 1.9 % (ref 0.0–5.0)
HCT: 46 % (ref 39.0–52.0)
Hemoglobin: 15.4 g/dL (ref 13.0–17.0)
Lymphocytes Relative: 17.4 % (ref 12.0–46.0)
Lymphs Abs: 1.5 10*3/uL (ref 0.7–4.0)
MCHC: 33.5 g/dL (ref 30.0–36.0)
MCV: 90.6 fl (ref 78.0–100.0)
Monocytes Absolute: 0.6 10*3/uL (ref 0.1–1.0)
Monocytes Relative: 7.2 % (ref 3.0–12.0)
Neutro Abs: 6.1 10*3/uL (ref 1.4–7.7)
Neutrophils Relative %: 72.7 % (ref 43.0–77.0)
Platelets: 222 10*3/uL (ref 150.0–400.0)
RBC: 5.08 Mil/uL (ref 4.22–5.81)
RDW: 13.6 % (ref 11.5–15.5)
WBC: 8.4 10*3/uL (ref 4.0–10.5)

## 2019-05-26 LAB — COMPREHENSIVE METABOLIC PANEL
ALT: 51 U/L (ref 0–53)
AST: 43 U/L — ABNORMAL HIGH (ref 0–37)
Albumin: 4.2 g/dL (ref 3.5–5.2)
Alkaline Phosphatase: 50 U/L (ref 39–117)
BUN: 30 mg/dL — ABNORMAL HIGH (ref 6–23)
CO2: 27 mEq/L (ref 19–32)
Calcium: 10.2 mg/dL (ref 8.4–10.5)
Chloride: 105 mEq/L (ref 96–112)
Creatinine, Ser: 1.35 mg/dL (ref 0.40–1.50)
GFR: 52.57 mL/min — ABNORMAL LOW (ref >60.00)
Glucose, Bld: 131 mg/dL — ABNORMAL HIGH (ref 70–99)
Potassium: 5.2 mEq/L — ABNORMAL HIGH (ref 3.5–5.1)
Sodium: 142 mEq/L (ref 135–145)
Total Bilirubin: 0.6 mg/dL (ref 0.2–1.2)
Total Protein: 6.8 g/dL (ref 6.0–8.3)

## 2019-05-26 LAB — URIC ACID: Uric Acid, Serum: 5.1 mg/dL (ref 4.0–7.8)

## 2019-05-26 LAB — HEMOGLOBIN A1C: Hgb A1c MFr Bld: 10.4 % — ABNORMAL HIGH (ref 4.6–6.5)

## 2019-05-28 ENCOUNTER — Ambulatory Visit: Payer: Medicare HMO | Attending: Internal Medicine

## 2019-05-28 DIAGNOSIS — Z23 Encounter for immunization: Secondary | ICD-10-CM

## 2019-05-28 NOTE — Progress Notes (Signed)
   Covid-19 Vaccination Clinic  Name:  Alan Morrison    MRN: [id number] DOB: [birth year]  05/28/2019  Alan Morrison was observed post Covid-19 immunization for 15 minutes without incidence. He was provided with Vaccine Information Sheet and instruction to access the V-Safe system.   Alan Morrison was instructed to call 911 with any severe reactions post vaccine: [name] Difficulty breathing  . Swelling of your face and throat  . A fast heartbeat  . A bad rash all over your body  . Dizziness and weakness    Immunizations Administered    Name Date Dose VIS Date Route   Pfizer COVID-19 Vaccine 05/28/2019  2:38 AM 0.3 mL 03/31/2019 Intramuscular   Manufacturer: [place]   Lot: [id number]   [place]: [id number]

## 2019-05-31 ENCOUNTER — Ambulatory Visit (INDEPENDENT_AMBULATORY_CARE_PROVIDER_SITE_OTHER): Payer: Medicare HMO | Admitting: Family Medicine

## 2019-05-31 ENCOUNTER — Other Ambulatory Visit: Payer: Self-pay

## 2019-05-31 ENCOUNTER — Encounter: Payer: Self-pay | Admitting: Family Medicine

## 2019-05-31 VITALS — BP 124/62 | HR 79 | Temp 97.9°F | Ht 66.5 in | Wt 212.2 lb

## 2019-05-31 DIAGNOSIS — Z7189 Other specified counseling: Secondary | ICD-10-CM | POA: Diagnosis not present

## 2019-05-31 DIAGNOSIS — I10 Essential (primary) hypertension: Secondary | ICD-10-CM

## 2019-05-31 DIAGNOSIS — K219 Gastro-esophageal reflux disease without esophagitis: Secondary | ICD-10-CM

## 2019-05-31 DIAGNOSIS — Z0000 Encounter for general adult medical examination without abnormal findings: Secondary | ICD-10-CM | POA: Diagnosis not present

## 2019-05-31 DIAGNOSIS — E785 Hyperlipidemia, unspecified: Secondary | ICD-10-CM

## 2019-05-31 DIAGNOSIS — E1165 Type 2 diabetes mellitus with hyperglycemia: Secondary | ICD-10-CM

## 2019-05-31 DIAGNOSIS — E790 Hyperuricemia without signs of inflammatory arthritis and tophaceous disease: Secondary | ICD-10-CM | POA: Diagnosis not present

## 2019-05-31 DIAGNOSIS — N401 Enlarged prostate with lower urinary tract symptoms: Secondary | ICD-10-CM

## 2019-05-31 DIAGNOSIS — E669 Obesity, unspecified: Secondary | ICD-10-CM

## 2019-05-31 DIAGNOSIS — I2510 Atherosclerotic heart disease of native coronary artery without angina pectoris: Secondary | ICD-10-CM

## 2019-05-31 DIAGNOSIS — Z87891 Personal history of nicotine dependence: Secondary | ICD-10-CM | POA: Diagnosis not present

## 2019-05-31 DIAGNOSIS — E1140 Type 2 diabetes mellitus with diabetic neuropathy, unspecified: Secondary | ICD-10-CM

## 2019-05-31 DIAGNOSIS — N138 Other obstructive and reflux uropathy: Secondary | ICD-10-CM

## 2019-05-31 DIAGNOSIS — E1169 Type 2 diabetes mellitus with other specified complication: Secondary | ICD-10-CM | POA: Diagnosis not present

## 2019-05-31 DIAGNOSIS — I82412 Acute embolism and thrombosis of left femoral vein: Secondary | ICD-10-CM

## 2019-05-31 DIAGNOSIS — IMO0002 Reserved for concepts with insufficient information to code with codable children: Secondary | ICD-10-CM

## 2019-05-31 MED ORDER — OMEPRAZOLE 40 MG PO CPDR: 40.0000 mg | DELAYED_RELEASE_CAPSULE | Freq: Every day | ORAL | 3 refills | Status: DC

## 2019-05-31 MED ORDER — METFORMIN HCL 1000 MG PO TABS: 1000.0000 mg | ORAL_TABLET | Freq: Two times a day (BID) | ORAL | 3 refills | Status: DC

## 2019-05-31 MED ORDER — FINASTERIDE 5 MG PO TABS: 5.0000 mg | ORAL_TABLET | Freq: Every day | ORAL | 3 refills | Status: DC

## 2019-05-31 MED ORDER — TAMSULOSIN HCL 0.4 MG PO CAPS: 0.4000 mg | ORAL_CAPSULE | Freq: Every day | ORAL | 3 refills | Status: DC

## 2019-05-31 MED ORDER — LOSARTAN POTASSIUM 25 MG PO TABS: 25.0000 mg | ORAL_TABLET | Freq: Every day | ORAL | 3 refills | Status: DC

## 2019-05-31 MED ORDER — PROBENECID 500 MG PO TABS: 250.0000 mg | ORAL_TABLET | ORAL | 3 refills | Status: DC

## 2019-05-31 NOTE — Assessment & Plan Note (Signed)
Recurring symptoms needing more tums - start omeprazole 40mg  daily x 3 wks then PRN, update if persistent symptoms.

## 2019-05-31 NOTE — Assessment & Plan Note (Signed)
Chronic, stable. Continue current regimen. 

## 2019-05-31 NOTE — Assessment & Plan Note (Signed)
Appreciate heme care - has been recommended indefinite low dose xarelto.

## 2019-05-31 NOTE — Assessment & Plan Note (Signed)
Chronic stable on crestor - continue. The ASCVD Risk score Mikey Bussing DC Jr., et al., 2013) failed to calculate for the following reasons:   The valid total cholesterol range is 130 to 320 mg/dL

## 2019-05-31 NOTE — Assessment & Plan Note (Signed)
Continue aspirin, crestor.  

## 2019-05-31 NOTE — Assessment & Plan Note (Signed)
On flomax and proscar, with PSA yearly. Discussed doubling PSA to reach true value.

## 2019-05-31 NOTE — Assessment & Plan Note (Addendum)
Quit remotely - not eligible for lung cancer screen.  Will check AAA screen.

## 2019-05-31 NOTE — Addendum Note (Signed)
Addended by: [name] on: 05/31/2019 05:12 PM   Modules accepted: Orders

## 2019-05-31 NOTE — Patient Instructions (Addendum)
We will request latest note from Dr Marica Otter [facility]) for latest eye exam.  Take omeprazole 40mg  daily for 3 weeks then as needed. Call to schedule appointment with Dr Loanne Drilling.  Good to see you today, call us with questions Return in 6 months for follow up visit.   Health Maintenance After Age 68 After age 59, you are at a higher risk for certain long-term diseases and infections as well as injuries from falls. Falls are a major cause of broken bones and head injuries in people who are older than age 68. Getting regular preventive care can help to keep you healthy and well. Preventive care includes getting regular testing and making lifestyle changes as recommended by your health care provider. Talk with your health care provider about:  Which screenings and tests you should have. A screening is a test that checks for a disease when you have no symptoms.  A diet and exercise plan that is right for you. What should I know about screenings and tests to prevent falls? Screening and testing are the best ways to find a health problem early. Early diagnosis and treatment give you the best chance of managing medical conditions that are common after age 56. Certain conditions and lifestyle choices may make you more likely to have a fall. Your health care provider may recommend:  Regular vision checks. Poor vision and conditions such as cataracts can make you more likely to have a fall. If you wear glasses, make sure to get your prescription updated if your vision changes.  Medicine review. Work with your health care provider to regularly review all of the medicines you are taking, including over-the-counter medicines. Ask your health care provider about any side effects that may make you more likely to have a fall. Tell your health care provider if any medicines that you take make you feel dizzy or sleepy.  Osteoporosis screening. Osteoporosis is a condition that causes the bones to get weaker.  This can make the bones weak and cause them to break more easily.  Blood pressure screening. Blood pressure changes and medicines to control blood pressure can make you feel dizzy.  Strength and balance checks. Your health care provider may recommend certain tests to check your strength and balance while standing, walking, or changing positions.  Foot health exam. Foot pain and numbness, as well as not wearing proper footwear, can make you more likely to have a fall.  Depression screening. You may be more likely to have a fall if you have a fear of falling, feel emotionally low, or feel unable to do activities that you used to do.  Alcohol use screening. Using too much alcohol can affect your balance and may make you more likely to have a fall. What actions can I take to lower my risk of falls? General instructions  Talk with your health care provider about your risks for falling. Tell your health care provider if: ? You fall. Be sure to tell your health care provider about all falls, even ones that seem minor. ? You feel dizzy, sleepy, or off-balance.  Take over-the-counter and prescription medicines only as told by your health care provider. These include any supplements.  Eat a healthy diet and maintain a healthy weight. A healthy diet includes low-fat dairy products, low-fat (lean) meats, and fiber from whole grains, beans, and lots of fruits and vegetables. Home safety  Remove any tripping hazards, such as rugs, cords, and clutter.  Install safety equipment such as  grab bars in bathrooms and safety rails on stairs.  Keep rooms and walkways well-lit. Activity   Follow a regular exercise program to stay fit. This will help you maintain your balance. Ask your health care provider what types of exercise are appropriate for you.  If you need a cane or walker, use it as recommended by your health care provider.  Wear supportive shoes that have nonskid soles. Lifestyle  Do not  drink alcohol if your health care provider tells you not to drink.  If you drink alcohol, limit how much you have: ? 0-1 drink a day for women. ? 0-2 drinks a day for men.  Be aware of how much alcohol is in your drink. In the [place] one drink equals one typical bottle of beer (12 oz), one-half glass of wine (5 oz), or one shot of hard liquor (1 oz).  Do not use any products that contain nicotine or tobacco, such as cigarettes and e-cigarettes. If you need help quitting, ask your health care provider. Summary  Having a healthy lifestyle and getting preventive care can help to protect your health and wellness after age 60.  Screening and testing are the best way to find a health problem early and help you avoid having a fall. Early diagnosis and treatment give you the best chance for managing medical conditions that are more common for people who are older than age 76.  Falls are a major cause of broken bones and head injuries in people who are older than age 42. Take precautions to prevent a fall at home.  Work with your health care provider to learn what changes you can make to improve your health and wellness and to prevent falls. This information is not intended to replace advice given to you by your health care provider. Make sure you discuss any questions you have with your health care provider. Document Revised: 07/28/2018 Document Reviewed: 02/17/2017 Elsevier Patient Education  2020 [employer].

## 2019-05-31 NOTE — Assessment & Plan Note (Signed)
Has living will at home - previously asked to bring [place] a copy.  Wife is HCPOA.

## 2019-05-31 NOTE — Assessment & Plan Note (Signed)

## 2019-05-31 NOTE — Progress Notes (Addendum)
This visit was conducted in person.  BP 124/62 (BP Location: Left Arm, Patient Position: Sitting, Cuff Size: Large)   Pulse 79   Temp 97.9 F (36.6 C) (Temporal)   Ht 5' 6.5" (1.689 m)   Wt 212 lb 3 oz (96.2 kg)   SpO2 96%   BMI 33.73 kg/m    CC: Welcome to medicare visit Subjective:    Patient ID: Alan Morrison, male    DOB: [birth year], 69 y.o.   MRN: [id number]  HPI: Alan Morrison is a 69 y.o. male presenting on 05/31/2019 for Welcome to Medicare    Hearing Screening   125Hz  250Hz  500Hz  1000Hz  2000Hz  3000Hz  4000Hz  6000Hz  8000Hz   Right ear:   40 20 20  40    Left ear:   20 20 20   40      Visual Acuity Screening   Right eye Left eye Both eyes  Without correction:     With correction: 20/20 20/20 20/15       Office Visit from 05/31/2019 in [place] [facility] [place] [facility]  PHQ-2 Total Score  1      Fall Risk  05/31/2019 02/02/2019 02/11/2017  Falls in the past year? 0 0 No      1st unprovoked extensive DVT 04/2018 - saw heme, planned indefinite xarelto 10mg  daily, yearly check with heme. Rpt [place] 10/2018 - cleared DVT.   DM followed by endo Dr Loanne Drilling - sugars staying high. He self titrated meds 3 wks ago - to 100u basaglar (50u bid) (about to change to lantus for insurance purposes) and 30u TID humalog. Continues metformin 1000mg  bid. No low sugars or hypoglycemia with these changes. Lowest sugar 88. Checking cbg 2-3 times a day. Will call to schedule f/u appt today.  Lab Results  Component Value Date   HGBA1C 10.4 (H) 05/26/2019    Abd ache noted over the last few months - describes epigastric ache, occasional heartburn. Has needed to increase tums use. No n/v/d/c. No blood in stool. No urinary changes. No boring pain to the back. No significant early satiety or dysphagia.   Preventative: COLONOSCOPY Date: 07/2013 1 benign polyp rpt 10 yrs [name])  Prostate -normal in the past.Would like to continue checking yearly. No prostate symptoms.  Lung cancer screening - not  eligible  AAA screen - no fmhx - not eligible Flu shot yearly at work  Pneumovax 2013, 2019, prevnar 2018. Tdap 2016 Zostavax- 08/2013 Shingrix -completed series 2019 Covid pfizer 05/2019 Advanced planning discussion. Has at home. Wife is HCPOA. We have asked him to bring [place] a copy.  Seat belt use discussed. Sunscreen use discussed. No changing moles. Ex smoker - quit remotely - 1972 - 4 PY hx Alcohol - occasional beer  Dentist - Q35mo  Eye exam - yearly  Bowel - no constipation Bladder - no incontinence  Lives with wife  Occupation: equipment maintenance  Activity:walking - limited by knees Diet:goodwater, daily fruits/vegetables, diet drinks     Relevant past medical, surgical, family and social history reviewed and updated as indicated. Interim medical history since our last visit reviewed. Allergies and medications reviewed and updated. Outpatient Medications Prior to Visit  Medication Sig Dispense Refill  . ASPIRIN 81 PO Take 1 tablet by mouth daily.    . Cholecalciferol (VITAMIN D) 2000 units tablet Take 2,000 Units by mouth daily.    . Insulin Glargine (LANTUS SOLOSTAR) 100 UNIT/ML Solostar Pen Inject 75 Units into the skin at bedtime. 25 pen PRN  . insulin lispro (  HUMALOG KWIKPEN) 100 UNIT/ML KwikPen INJECT 3 TIMES A DAY (JUST BEFORE EACH MEAL) 25-28-25 UNITS. 15 mL 1  . Insulin Pen Needle (B-D ULTRAFINE III SHORT PEN) 31G X 8 MM MISC 1 Device by Other route 4 (four) times daily. USE AS DIRECTED DAILY 120 each 11  . Multiple Vitamin (MULTIVITAMIN WITH MINERALS) TABS tablet Take 1 tablet by mouth daily.    Glory Rosebush ULTRA test strip USE TO CHECK SUGARS DAILY  AS DIRECTED 100 strip 3  . rivaroxaban (XARELTO) 10 MG TABS tablet Take 1 tablet (10 mg total) by mouth daily. 90 tablet 4  . rosuvastatin (CRESTOR) 40 MG tablet TAKE 1 TABLET BY MOUTH DAILY. <PLEASE MAKE APPOINTMENT FOR REFILLS> 30 tablet 0  . vitamin C (ASCORBIC ACID) 500 MG tablet Take 500 mg by mouth  every evening.     . finasteride (PROSCAR) 5 MG tablet TAKE 1 TABLET BY MOUTH  DAILY 90 tablet 3  . losartan (COZAAR) 25 MG tablet TAKE 1 TABLET BY MOUTH EVERY DAY 90 tablet 3  . metFORMIN (GLUCOPHAGE) 1000 MG tablet TAKE 1 TABLET BY MOUTH  TWICE DAILY 180 tablet 3  . probenecid (BENEMID) 500 MG tablet TAKE ONE-HALF TABLET BY  MOUTH DAILY (Patient taking differently: every other day. ) 45 tablet 3  . tamsulosin (FLOMAX) 0.4 MG CAPS capsule TAKE 1 CAPSULE BY MOUTH  DAILY 90 capsule 3  . metoprolol tartrate (LOPRESSOR) 25 MG tablet Take 0.5 tablets (12.5 mg total) by mouth 2 (two) times daily. 30 tablet 8   No facility-administered medications prior to visit.     Per HPI unless specifically indicated in ROS section below Review of Systems Objective:    BP 124/62 (BP Location: Left Arm, Patient Position: Sitting, Cuff Size: Large)   Pulse 79   Temp 97.9 F (36.6 C) (Temporal)   Ht 5' 6.5" (1.689 m)   Wt 212 lb 3 oz (96.2 kg)   SpO2 96%   BMI 33.73 kg/m   Wt Readings from Last 3 Encounters:  05/31/19 212 lb 3 oz (96.2 kg)  02/02/19 213 lb (96.6 kg)  11/18/18 211 lb 1.6 oz (95.8 kg)    Physical Exam Vitals and nursing note reviewed.  Constitutional:      General: He is not in acute distress.    Appearance: Normal appearance. He is well-developed. He is obese. He is not ill-appearing.  HENT:     Head: Normocephalic and atraumatic.     Right Ear: Hearing, tympanic membrane, ear canal and external ear normal.     Left Ear: Hearing, tympanic membrane, ear canal and external ear normal.     Mouth/Throat:     Pharynx: Uvula midline.  Eyes:     General: No scleral icterus.    Extraocular Movements: Extraocular movements intact.     Conjunctiva/sclera: Conjunctivae normal.     Pupils: Pupils are equal, round, and reactive to light.  Neck:     Vascular: No carotid bruit.  Cardiovascular:     Rate and Rhythm: Normal rate and regular rhythm.     Pulses: Normal pulses.           Radial pulses are 2+ on the right side and 2+ on the left side.     Heart sounds: Normal heart sounds. No murmur.  Pulmonary:     Effort: Pulmonary effort is normal. No respiratory distress.     Breath sounds: Normal breath sounds. No wheezing, rhonchi or rales.  Abdominal:     General:  Abdomen is flat. Bowel sounds are normal. There is no distension.     Palpations: Abdomen is soft. There is no mass.     Tenderness: There is no abdominal tenderness. There is no guarding or rebound.     Hernia: No hernia is present.  Musculoskeletal:        General: Normal range of motion.     Cervical back: Normal range of motion and neck supple.     Right lower leg: No edema.     Left lower leg: No edema.  Lymphadenopathy:     Cervical: No cervical adenopathy.  Skin:    General: Skin is warm and dry.     Findings: No rash.  Neurological:     General: No focal deficit present.     Mental Status: He is alert and oriented to person, place, and time.     Comments:  CN grossly intact, station and gait intact 3/3 recall  5/5 calculation DLROW  Psychiatric:        Mood and Affect: Mood normal.        Behavior: Behavior normal.        Thought Content: Thought content normal.        Judgment: Judgment normal.       Results for orders placed or performed in visit on 05/26/19  Uric acid  Result Value Ref Range   Uric Acid, Serum 5.1 4.0 - 7.8 mg/dL  CBC with Differential/Platelet  Result Value Ref Range   WBC 8.4 4.0 - 10.5 K/uL   RBC 5.08 4.22 - 5.81 Mil/uL   Hemoglobin 15.4 13.0 - 17.0 g/dL   HCT 46.0 39.0 - 52.0 %   MCV 90.6 78.0 - 100.0 fl   MCHC 33.5 30.0 - 36.0 g/dL   RDW 13.6 11.5 - 15.5 %   Platelets 222.0 150.0 - 400.0 K/uL   Neutrophils Relative % 72.7 43.0 - 77.0 %   Lymphocytes Relative 17.4 12.0 - 46.0 %   Monocytes Relative 7.2 3.0 - 12.0 %   Eosinophils Relative 1.9 0.0 - 5.0 %   Basophils Relative 0.8 0.0 - 3.0 %   Neutro Abs 6.1 1.4 - 7.7 K/uL   Lymphs Abs 1.5 0.7 - 4.0  K/uL   Monocytes Absolute 0.6 0.1 - 1.0 K/uL   Eosinophils Absolute 0.2 0.0 - 0.7 K/uL   Basophils Absolute 0.1 0.0 - 0.1 K/uL  Hemoglobin A1c  Result Value Ref Range   Hgb A1c MFr Bld 10.4 (H) 4.6 - 6.5 %  Comprehensive metabolic panel  Result Value Ref Range   Sodium 142 135 - 145 mEq/L   Potassium 5.2 No hemolysis seen (H) 3.5 - 5.1 mEq/L   Chloride 105 96 - 112 mEq/L   CO2 27 19 - 32 mEq/L   Glucose, Bld 131 (H) 70 - 99 mg/dL   BUN 30 (H) 6 - 23 mg/dL   Creatinine, Ser 1.35 0.40 - 1.50 mg/dL   Total Bilirubin 0.6 0.2 - 1.2 mg/dL   Alkaline Phosphatase 50 39 - 117 U/L   AST 43 (H) 0 - 37 U/L   ALT 51 0 - 53 U/L   Total Protein 6.8 6.0 - 8.3 g/dL   Albumin 4.2 3.5 - 5.2 g/dL   GFR 52.57 (L) >60.00 mL/min   Calcium 10.2 8.4 - 10.5 mg/dL  Lipid panel  Result Value Ref Range   Cholesterol 92 0 - 200 mg/dL   Triglycerides 112.0 0.0 - 149.0 mg/dL   HDL 28.30 (  L) >39.00 mg/dL   VLDL 22.4 0.0 - 40.0 mg/dL   LDL Cholesterol 42 0 - 99 mg/dL   Total CHOL/HDL Ratio 3    NonHDL 64.00    Lab Results  Component Value Date   PSA1 0.4 06/28/2018   PSA 0.65 01/26/2019   PSA 0.53 10/11/2017   PSA 0.87 10/16/2016   EKG - NSR rate 75, normal axis, intervals, no acute ST/T changes. Assessment & Plan:  This visit occurred during the SARS-CoV-2 public health emergency.  Safety protocols were in place, including screening questions prior to the visit, additional usage of staff PPE, and extensive cleaning of exam room while observing appropriate contact time as indicated for disinfecting solutions.   Problem List Items Addressed This Visit    Welcome to Medicare preventive visit - Primary    I have personally reviewed the Medicare Annual Wellness questionnaire and have noted 1. The patient's medical and social history 2. Their use of alcohol, tobacco or illicit drugs 3. Their current medications and supplements 4. The patient's functional ability including ADL's, fall risks, home safety  risks and hearing or visual impairment. Cognitive function has been assessed and addressed as indicated.  5. Diet and physical activity 6. Evidence for depression or mood disorders The patients weight, height, BMI have been recorded in the chart. I have made referrals, counseling and provided education to the patient based on review of the above and I have provided the pt with a written personalized care plan for preventive services. Provider list updated.. See scanned questionairre as needed for further documentation. Reviewed preventative protocols and updated unless pt declined.       Relevant Orders   EKG 12-Lead (Completed)   Type 2 diabetes, uncontrolled, with neuropathy (HCC)    Chronic, deteriorated. He titrated insulin on his own - reviewed dangers of this. He will call to f/u with endo. Currently on lantus 50u BID and humalog 30u TID. Has not had low sugars below 88 or hypoglycemic symptoms. May be good candidate for CGM - briefly discussed this.       Relevant Medications   ASPIRIN 81 PO   losartan (COZAAR) 25 MG tablet   metFORMIN (GLUCOPHAGE) 1000 MG tablet   Obesity, Class I, BMI 30-34.9    Continue to encourage sustainable weight loss efforts through healthy diet and lifestyle. Activity limited by knee arthritis.       Hyperuricemia    Stable period on 1/2 probenecid every other day - continue, consider further titration. He also continues vitamin C daily.       Hyperlipidemia associated with type 2 diabetes mellitus (HCC)    Chronic stable on crestor - continue. The ASCVD Risk score Mikey Bussing DC Jr., et al., 2013) failed to calculate for the following reasons:   The valid total cholesterol range is 130 to 320 mg/dL       Relevant Medications   ASPIRIN 81 PO   losartan (COZAAR) 25 MG tablet   metFORMIN (GLUCOPHAGE) 1000 MG tablet   GERD (gastroesophageal reflux disease)    Recurring symptoms needing more tums - start omeprazole 40mg  daily x 3 wks then PRN, update if  persistent symptoms.       Relevant Medications   omeprazole (PRILOSEC) 40 MG capsule   Ex-smoker    Quit remotely - not eligible for lung cancer screen.  Will check AAA screen.       Relevant Orders   VAS US AORTA MEDICARE SCREEN   Essential hypertension, benign  Chronic, stable. Continue current regimen.       Relevant Medications   ASPIRIN 81 PO   losartan (COZAAR) 25 MG tablet   DVT of deep femoral vein, left (HCC)    Appreciate heme care - has been recommended indefinite low dose xarelto.       Relevant Medications   ASPIRIN 81 PO   losartan (COZAAR) 25 MG tablet   CAD (coronary artery disease), native coronary artery    Continue aspirin, crestor       Relevant Medications   ASPIRIN 81 PO   losartan (COZAAR) 25 MG tablet   Benign prostatic hyperplasia with urinary obstruction    On flomax and proscar, with PSA yearly. Discussed doubling PSA to reach true value.       Relevant Medications   finasteride (PROSCAR) 5 MG tablet   tamsulosin (FLOMAX) 0.4 MG CAPS capsule   Advanced directives, counseling/discussion    Has living will at home - previously asked to bring [place] a copy.  Wife is HCPOA.           Meds ordered this encounter  Medications  . omeprazole (PRILOSEC) 40 MG capsule    Sig: Take 1 capsule (40 mg total) by mouth daily. Daily for 3 weeks then as needed    Dispense:  30 capsule    Refill:  3  . finasteride (PROSCAR) 5 MG tablet    Sig: Take 1 tablet (5 mg total) by mouth daily.    Dispense:  90 tablet    Refill:  3  . losartan (COZAAR) 25 MG tablet    Sig: Take 1 tablet (25 mg total) by mouth daily.    Dispense:  90 tablet    Refill:  3  . metFORMIN (GLUCOPHAGE) 1000 MG tablet    Sig: Take 1 tablet (1,000 mg total) by mouth 2 (two) times daily.    Dispense:  180 tablet    Refill:  3  . probenecid (BENEMID) 500 MG tablet    Sig: Take 0.5 tablets (250 mg total) by mouth every other day.    Dispense:  45 tablet    Refill:  3  .  tamsulosin (FLOMAX) 0.4 MG CAPS capsule    Sig: Take 1 capsule (0.4 mg total) by mouth daily.    Dispense:  90 capsule    Refill:  3   Orders Placed This Encounter  Procedures  . EKG 12-Lead    Patient instructions: We will request latest note from Dr Marica Otter [facility]) for latest eye exam.  Take omeprazole 40mg  daily for 3 weeks then as needed. Call to schedule appointment with Dr Loanne Drilling.  Good to see you today, call us with questions Return in 6 months for follow up visit.   Follow up plan: Return in about 6 months (around 11/28/2019) for follow up visit.  [name], MD

## 2019-05-31 NOTE — Assessment & Plan Note (Signed)
Chronic, deteriorated. He titrated insulin on his own - reviewed dangers of this. He will call to f/u with endo. Currently on lantus 50u BID and humalog 30u TID. Has not had low sugars below 88 or hypoglycemic symptoms. May be good candidate for CGM - briefly discussed this.

## 2019-05-31 NOTE — Assessment & Plan Note (Addendum)
Stable period on 1/2 probenecid every other day - continue, consider further titration. He also continues vitamin C daily.

## 2019-05-31 NOTE — Assessment & Plan Note (Signed)
Continue to encourage sustainable weight loss efforts through healthy diet and lifestyle. Activity limited by knee arthritis.

## 2019-06-11 ENCOUNTER — Other Ambulatory Visit: Payer: Self-pay | Admitting: Cardiology

## 2019-06-12 ENCOUNTER — Telehealth: Payer: Self-pay | Admitting: Endocrinology

## 2019-06-12 NOTE — Telephone Encounter (Signed)
Patient dismissed from [facility] by Renato Shin, MD, effective 05/25/19. Dismissal Letter sent out by 1st class mail. KLM

## 2019-06-14 ENCOUNTER — Encounter: Payer: Self-pay | Admitting: Endocrinology

## 2019-06-15 ENCOUNTER — Telehealth: Payer: Self-pay | Admitting: Endocrinology

## 2019-06-15 ENCOUNTER — Ambulatory Visit: Payer: Self-pay

## 2019-06-15 NOTE — Telephone Encounter (Signed)
Since pt has scheduled appt AND before calling this pt, I wanted to follow up with Dr. Loanne Drilling as well.

## 2019-06-15 NOTE — Telephone Encounter (Signed)
Ammie please call in my absence as this pt said in a previous message that he is seeing his PCP for diabetes? He is not making appts with [place] as he should and is only asking [place] to refill rxs. This is the reason he was dismissed. Thank you

## 2019-06-15 NOTE — Telephone Encounter (Signed)
Until d/c becomes effective, we need to give appt if pt wants one

## 2019-06-15 NOTE — Telephone Encounter (Signed)
Please refer to Dr. Cordelia Pen response re: pt appt. Letter was written 05/25/19. According to Dr. Loanne Drilling, pt may keep appt AND care will still be provided until 06/23/19.

## 2019-06-15 NOTE — Telephone Encounter (Signed)
Please call patient at ph# [phone] re: Patient is scheduled for an appointment with Dr. Loanne Drilling on 06/19/19. I called patient to ask screening questions when patient told me he received a letter yesterday (06/14/19) that was postmarked 06/12/19 but was dated 05/25/19 that Dr. Loanne Drilling was dismissing him as a patient. Patient states he is floored because he had returned call to schedule a follow up appointment after being contacted by a gentleman at our office that it was time for a follow up appointment with Dr. Loanne Drilling. Patient states he told the gentleman that he would call  back to schedule his appointment after seeing his PCP. Patient saw his PCP on 05/31/19 and scheduled his appointment with Dr. Loanne Drilling on 06/02/19. Patient states he feels that this is a misunderstanding. Patient requests to be called at the ph# listed above to be advised on the situation.

## 2019-06-16 ENCOUNTER — Ambulatory Visit (HOSPITAL_COMMUNITY)
Admission: RE | Admit: 2019-06-16 | Discharge: 2019-06-16 | Disposition: A | Discharge status: Home / Self Care | Payer: Medicare HMO | Source: Ambulatory Visit | Attending: Cardiovascular Disease | Admitting: Cardiovascular Disease

## 2019-06-16 ENCOUNTER — Other Ambulatory Visit: Payer: Self-pay

## 2019-06-16 DIAGNOSIS — Z136 Encounter for screening for cardiovascular disorders: Secondary | ICD-10-CM

## 2019-06-16 DIAGNOSIS — I10 Essential (primary) hypertension: Secondary | ICD-10-CM | POA: Diagnosis not present

## 2019-06-16 DIAGNOSIS — E119 Type 2 diabetes mellitus without complications: Secondary | ICD-10-CM | POA: Insufficient documentation

## 2019-06-16 DIAGNOSIS — Z87891 Personal history of nicotine dependence: Secondary | ICD-10-CM | POA: Insufficient documentation

## 2019-06-16 DIAGNOSIS — I2510 Atherosclerotic heart disease of native coronary artery without angina pectoris: Secondary | ICD-10-CM | POA: Insufficient documentation

## 2019-06-16 DIAGNOSIS — E785 Hyperlipidemia, unspecified: Secondary | ICD-10-CM | POA: Diagnosis not present

## 2019-06-19 ENCOUNTER — Encounter: Payer: Self-pay | Admitting: Endocrinology

## 2019-06-19 ENCOUNTER — Other Ambulatory Visit: Payer: Self-pay

## 2019-06-19 ENCOUNTER — Ambulatory Visit (INDEPENDENT_AMBULATORY_CARE_PROVIDER_SITE_OTHER): Payer: Medicare HMO | Admitting: Endocrinology

## 2019-06-19 VITALS — BP 104/64 | HR 86 | Ht 66.5 in | Wt 217.8 lb

## 2019-06-19 DIAGNOSIS — E1165 Type 2 diabetes mellitus with hyperglycemia: Secondary | ICD-10-CM | POA: Diagnosis not present

## 2019-06-19 DIAGNOSIS — E1140 Type 2 diabetes mellitus with diabetic neuropathy, unspecified: Secondary | ICD-10-CM

## 2019-06-19 DIAGNOSIS — IMO0002 Reserved for concepts with insufficient information to code with codable children: Secondary | ICD-10-CM

## 2019-06-19 LAB — POCT GLYCOSYLATED HEMOGLOBIN (HGB A1C): Hemoglobin A1C: 8.8 % — AB (ref 4.0–5.6)

## 2019-06-19 MED ORDER — BASAGLAR KWIKPEN 100 UNIT/ML ~~LOC~~ SOPN: 100.0000 [IU] | PEN_INJECTOR | Freq: Every day | SUBCUTANEOUS | 3 refills | Status: DC

## 2019-06-19 MED ORDER — BASAGLAR KWIKPEN 100 UNIT/ML ~~LOC~~ SOPN: 100.0000 [IU] | PEN_INJECTOR | Freq: Every day | SUBCUTANEOUS | 11 refills | Status: DC

## 2019-06-19 MED ORDER — NOVOLOG FLEXPEN 100 UNIT/ML ~~LOC~~ SOPN: 35.0000 [IU] | PEN_INJECTOR | Freq: Three times a day (TID) | SUBCUTANEOUS | 3 refills | Status: DC

## 2019-06-19 MED ORDER — INSULIN LISPRO (1 UNIT DIAL) 100 UNIT/ML (KWIKPEN): 35.0000 [IU] | PEN_INJECTOR | Freq: Three times a day (TID) | SUBCUTANEOUS | Status: DC

## 2019-06-19 MED ORDER — NOVOLOG FLEXPEN 100 UNIT/ML ~~LOC~~ SOPN: 35.0000 [IU] | PEN_INJECTOR | Freq: Three times a day (TID) | SUBCUTANEOUS | 11 refills | Status: DC

## 2019-06-19 NOTE — Telephone Encounter (Signed)
Dr. Loanne Drilling has decided against this dismissal.  This is being sent to HIM to reverse action please. Olin Hauser, can you please stop the processing of this dismissal or give me the actions to do so myself? Thank you

## 2019-06-19 NOTE — Patient Instructions (Signed)
A different type of diabetes blood test is requested for you today.  We'll let you know about the results.  check your blood sugar twice a day.  vary the time of day when you check, between before the 3 meals, and at bedtime.  also check if you have symptoms of your blood sugar being too high or too low.  please keep a record of the readings and bring it to your next appointment here (or you can bring the meter itself).  You can write it on any piece of paper.  please call us sooner if your blood sugar goes below 70, or if you have a lot of readings over 200. Please come back for a follow-up appointment in 2 months.   

## 2019-06-19 NOTE — Progress Notes (Signed)
Subjective:    Patient ID: Alan Morrison, male    DOB: 03-06-1951, 69 y.o.   MRN: [id number]  HPI Pt returns for f/u of diabetes mellitus: DM type: Insulin-requiring type 2.  Dx'ed: [id number] Complications: polyneuropathy, renal insuff, and CAD.  Therapy: insulin since early 2018, and metformin.   DKA: never Severe hypoglycemia: never.   Pancreatitis: never.   Pancreatic imaging: never.  Other: he takes multiple daily injections.  Interval history: pt says cbg's vary from 60-350.  It is in general lowest at HS, and highest at lunch.  pt states he feels well in general.  He takes 35 units 3 times a day (just before each meal), and basaglar, 100 units qd.  He increased the insulin 1 month ago, following high A1c then.  Past Medical History:  Diagnosis Date  . Anemia, iron deficiency, inadequate dietary intake   . 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   by [place]  . CAD (coronary artery disease) 2003   cath - Min Dz, EF 65%, Adm.R/O'd  . Cervical spondylosis without myelopathy   . Chest pain, atypical   . Choroidal nevus 01/22/2011   left, yearly eye exam, no diabetic retinopathy  . Diabetes mellitus type II   . Dyspepsia   . Ex-smoker   . Fatty liver 02/2016   by [place]  . GERD (gastroesophageal reflux disease)   . Headache(784.0)   . Heart murmur    Hx of  . History of MRI of brain and brain stem 09/06  . History of MRSA infection 12/2013   R knee boil  . HLD (hyperlipidemia)   . Hyperuricemia   . Obesity   . Sleep apnea   . Stress-induced cardiomyopathy 09/27/98   WNL, EF 64%  . Urosepsis 01/01-01/05/10   Hospitalization    Past Surgical History:  Procedure Laterality Date  . COLONOSCOPY  07/2013   1 benign polyp rpt 10 yrs [name])  . KNEE ARTHROSCOPY Left 1988  . KNEE SURGERY Right 05/21/03   Right, Dr. Marlou Sa, med meniscus tear via MRI  . LEFT HEART CATH AND CORONARY ANGIOGRAPHY N/A 04/01/2017   Procedure: LEFT HEART CATH AND CORONARY ANGIOGRAPHY;  Surgeon: Martinique, Peter  M, MD;  Location: [facility];  Service: Cardiovascular;  Laterality: N/A;  . MYELOGRAM  08/05   bulging disc    Social History   Socioeconomic History  . Marital status: Married    Spouse name: Not on file  . Number of children: 2  . Years of education: Not on file  . Highest education level: Not on file  Occupational History  . Occupation: Maintenance    Employer: [employer]  Tobacco Use  . Smoking status: Former Smoker    Quit date: 04/20/1974    Years since quitting: 45.1  . Smokeless tobacco: Never Used  . Tobacco comment: quit over 20 years  Substance and Sexual Activity  . Alcohol use: Yes    Alcohol/week: 3.0 standard drinks    Types: 3 Cans of beer per week    Comment: occasional  . Drug use: No  . Sexual activity: Not on file  Other Topics Concern  . Not on file  Social History Narrative   Lives with wife   Occupation: equipment maintenance   Activity: no regular exercise - hunts   Diet: good water, fruits/vegetables   Social Determinants of Health   Financial Resource Strain:   . Difficulty of Paying Living Expenses: Not on file  Food Insecurity:   .  Worried About Charity fundraiser in the Last Year: Not on file  . Ran Out of Food in the Last Year: Not on file  Transportation Needs:   . Lack of Transportation (Medical): Not on file  . Lack of Transportation (Non-Medical): Not on file  Physical Activity:   . Days of Exercise per Week: Not on file  . Minutes of Exercise per Session: Not on file  Stress:   . Feeling of Stress : Not on file  Social Connections:   . Frequency of Communication with Friends and Family: Not on file  . Frequency of Social Gatherings with Friends and Family: Not on file  . Attends Religious Services: Not on file  . Active Member of Clubs or Organizations: Not on file  . Attends Archivist Meetings: Not on file  . Marital Status: Not on file  Intimate Partner Violence:   . Fear of Current or  Ex-Partner: Not on file  . Emotionally Abused: Not on file  . Physically Abused: Not on file  . Sexually Abused: Not on file    Current Outpatient Medications on File Prior to Visit  Medication Sig Dispense Refill  . ASPIRIN 81 PO Take 1 tablet by mouth daily.    . Cholecalciferol (VITAMIN D) 2000 units tablet Take 2,000 Units by mouth daily.    . finasteride (PROSCAR) 5 MG tablet Take 1 tablet (5 mg total) by mouth daily. 90 tablet 3  . Insulin Pen Needle (B-D ULTRAFINE III SHORT PEN) 31G X 8 MM MISC 1 Device by Other route 4 (four) times daily. USE AS DIRECTED DAILY 120 each 11  . losartan (COZAAR) 25 MG tablet Take 1 tablet (25 mg total) by mouth daily. 90 tablet 3  . metFORMIN (GLUCOPHAGE) 1000 MG tablet Take 1 tablet (1,000 mg total) by mouth 2 (two) times daily. 180 tablet 3  . Multiple Vitamin (MULTIVITAMIN WITH MINERALS) TABS tablet Take 1 tablet by mouth daily.    [name] omeprazole (PRILOSEC) 40 MG capsule Take 1 capsule (40 mg total) by mouth daily. Daily for 3 weeks then as needed 30 capsule 3  . ONETOUCH ULTRA test strip USE TO CHECK SUGARS DAILY  AS DIRECTED 100 strip 3  . probenecid (BENEMID) 500 MG tablet Take 0.5 tablets (250 mg total) by mouth every other day. 45 tablet 3  . rivaroxaban (XARELTO) 10 MG TABS tablet Take 1 tablet (10 mg total) by mouth daily. 90 tablet 4  . rosuvastatin (CRESTOR) 40 MG tablet Take 1 tablet (40 mg total) by mouth daily. Please schedule annual appt with Dr. Martinique for refills. [phone]. 1st attempt. 30 tablet 0  . tamsulosin (FLOMAX) 0.4 MG CAPS capsule Take 1 capsule (0.4 mg total) by mouth daily. 90 capsule 3  . vitamin C (ASCORBIC ACID) 500 MG tablet Take 500 mg by mouth every evening.     . metoprolol tartrate (LOPRESSOR) 25 MG tablet Take 0.5 tablets (12.5 mg total) by mouth 2 (two) times daily. 30 tablet 8   No current facility-administered medications on file prior to visit.    Allergies  Allergen Reactions  . Victoza [Liraglutide]  Nausea Only    Irritation around injection site    Family History  Problem Relation Age of Onset  . Heart disease Mother        CHF  . Diabetes Mother   . Heart disease Father        CHF  . Hypertension Father   . Migraines Brother  severe headaches from arsenic in the past from  wood that was treated on his deck  . CAD Brother 50       stent  . Cancer Maternal Uncle        unsure  . Stroke Neg Hx   . Colon cancer Neg Hx     BP 104/64 (BP Location: Left Arm, Patient Position: Sitting, Cuff Size: Large)   Pulse 86   Ht 5' 6.5" (1.689 m)   Wt 217 lb 12.8 oz (98.8 kg)   SpO2 95%   BMI 34.63 kg/m    Review of Systems Denies LOC.      Objective:   Physical Exam VITAL SIGNS:  See vs page GENERAL: no distress Pulses: dorsalis pedis intact bilat.   MSK: no deformity of the feet CV: trace bilat leg edema Skin:  no ulcer on the feet.  normal color and temp on the feet.   Neuro: sensation is intact to touch on the feet.    Lab Results  Component Value Date   HGBA1C 8.8 (A) 06/19/2019   Lab Results  Component Value Date   CREATININE 1.35 05/26/2019   BUN 30 (H) 05/26/2019   NA 142 05/26/2019   K 5.2 No hemolysis seen (H) 05/26/2019   CL 105 05/26/2019   CO2 27 05/26/2019       Assessment & Plan:  Insulin-requiring type 2 DM, with CAD: glycemic control is apparently improved Hypoglycemia: this limits aggressiveness of glycemic control Renal insuff: he prob needs much more Novolog than Lantus, but we'll check fructosamine to measure degree of improvement first.  Also, we need to reduce metformin then  Patient Instructions  A different type of diabetes blood test is requested for you today.  We'll let you know about the results.  check your blood sugar twice a day.  vary the time of day when you check, between before the 3 meals, and at bedtime.  also check if you have symptoms of your blood sugar being too high or too low.  please keep a record of the  readings and bring it to your next appointment here (or you can bring the meter itself).  You can write it on any piece of paper.  please call us sooner if your blood sugar goes below 70, or if you have a lot of readings over 200. Please come back for a follow-up appointment in 2 months.

## 2019-06-20 ENCOUNTER — Encounter: Payer: Self-pay | Admitting: Endocrinology

## 2019-06-22 ENCOUNTER — Encounter: Payer: Self-pay | Admitting: Endocrinology

## 2019-06-22 ENCOUNTER — Ambulatory Visit: Payer: Medicare HMO | Attending: Internal Medicine

## 2019-06-22 DIAGNOSIS — Z23 Encounter for immunization: Secondary | ICD-10-CM | POA: Insufficient documentation

## 2019-06-22 LAB — FRUCTOSAMINE: Fructosamine: 313 umol/L — ABNORMAL HIGH (ref 205–285)

## 2019-06-22 NOTE — Progress Notes (Signed)
   Covid-19 Vaccination Clinic  Name:  Alan Morrison    MRN: [id number] DOB: [birth year]  06/22/2019  Mr. Alan was observed post Covid-19 immunization for 15 minutes without incident. He was provided with Vaccine Information Sheet and instruction to access the V-Safe system.   [name] was instructed to call 911 with any severe reactions post vaccine: [name] Difficulty breathing  . Swelling of face and throat  . A fast heartbeat  . A bad rash all over body  . Dizziness and weakness   Immunizations Administered    Name Date Dose VIS Date Route   Pfizer COVID-19 Vaccine 06/22/2019  8:39 AM 0.3 mL 03/31/2019 Intramuscular   Manufacturer: [place]   Lot: [id number]   [place]: [id number]

## 2019-07-06 ENCOUNTER — Other Ambulatory Visit: Payer: Self-pay | Admitting: Cardiology

## 2019-07-07 ENCOUNTER — Other Ambulatory Visit: Payer: Self-pay

## 2019-07-07 ENCOUNTER — Ambulatory Visit (INDEPENDENT_AMBULATORY_CARE_PROVIDER_SITE_OTHER): Payer: Medicare HMO | Admitting: Family Medicine

## 2019-07-07 ENCOUNTER — Ambulatory Visit: Payer: Medicare HMO | Admitting: Internal Medicine

## 2019-07-07 ENCOUNTER — Encounter: Payer: Self-pay | Admitting: Family Medicine

## 2019-07-07 ENCOUNTER — Encounter: Payer: Self-pay | Admitting: Oncology

## 2019-07-07 DIAGNOSIS — L0231 Cutaneous abscess of buttock: Secondary | ICD-10-CM

## 2019-07-07 DIAGNOSIS — L03317 Cellulitis of buttock: Secondary | ICD-10-CM | POA: Diagnosis not present

## 2019-07-07 MED ORDER — AMOXICILLIN-POT CLAVULANATE 875-125 MG PO TABS: 1.0000 | ORAL_TABLET | Freq: Two times a day (BID) | ORAL | 0 refills | Status: DC

## 2019-07-07 MED ORDER — DOXYCYCLINE HYCLATE 100 MG PO TABS: 100.0000 mg | ORAL_TABLET | Freq: Two times a day (BID) | ORAL | 0 refills | Status: DC

## 2019-07-07 NOTE — Progress Notes (Signed)
Subjective:    Patient ID: Alan Morrison, male    DOB: 17-May-1950, 69 y.o.   MRN: [id number]  This visit occurred during the SARS-CoV-2 public health emergency.  Safety protocols were in place, including screening questions prior to the visit, additional usage of staff PPE, and extensive cleaning of exam room while observing appropriate contact time as indicated for disinfecting solutions.    HPI 69 yo pt of Dr Darnell Level is here for a knot on L buttock (? Cyst) 4-5 days ago  He has a h/o DM2 He takes xarelto for h/o DVT  Also remote hx of MRSA (he does not remember this-is in chart as occurring on wound of knee)   He noticed the lump about 5 days ago  Hurts to sit  Not a hemorrhoid  Not prone to these   Has been using some hot compresses Has not drained   Uses dove soap in the shower   Patient Active Problem List   Diagnosis Date Noted  . Cellulitis and abscess of buttock 07/07/2019  . Welcome to Medicare preventive visit 05/31/2019  . Advanced directives, counseling/discussion 02/02/2019  . DVT of deep femoral vein, left ([place]) 04/29/2018  . GERD (gastroesophageal reflux disease)   . Cervical spondylosis without myelopathy   . Anemia, iron deficiency, inadequate dietary intake   . Angina pectoris ([place]) 04/01/2017  . Right hip pain 06/09/2016  . 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  . Fatty liver 02/19/2016  . Fatigue 06/06/2015  . Hyperuricemia   . Ex-smoker   . History of MRSA infection 12/19/2013  . BPV (benign positional vertigo) 12/13/2013  . Hyperlipidemia associated with type 2 diabetes mellitus ([place])   . Vitamin D deficiency 05/01/2012  . Obesity, Class I, BMI 30-34.9 06/08/2011  . Health maintenance examination 05/06/2011  . Choroidal nevus 01/22/2011  . Essential hypertension, benign 06/12/2010  . PALPITATIONS, OCCASIONAL 04/30/2010  . TESTICULAR HYPOFUNCTION 03/11/2009  . Benign prostatic hyperplasia with urinary obstruction 02/04/2009  . Sleep apnea  02/04/2009  . HEART MURMUR, HX OF 12/15/2008  . ARTHROSCOPY, KNEE, HX OF 12/15/2008  . Type 2 diabetes, uncontrolled, with neuropathy ([place]) 10/18/2006  . CAD (coronary artery disease), native coronary artery 12/12/2001  . CAD (coronary artery disease) 04/20/2001  . Stress-induced cardiomyopathy 09/27/1998   Past Medical History:  Diagnosis Date  . Anemia, iron deficiency, inadequate dietary intake   . 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   by [place]  . CAD (coronary artery disease) 2003   cath - Min Dz, EF 65%, Adm.R/O'd  . Cervical spondylosis without myelopathy   . Chest pain, atypical   . Choroidal nevus 01/22/2011   left, yearly eye exam, no diabetic retinopathy  . Diabetes mellitus type II   . Dyspepsia   . Ex-smoker   . Fatty liver 02/2016   by [place]  . GERD (gastroesophageal reflux disease)   . Headache(784.0)   . Heart murmur    Hx of  . History of MRI of brain and brain stem 09/06  . History of MRSA infection 12/2013   R knee boil  . HLD (hyperlipidemia)   . Hyperuricemia   . Obesity   . Sleep apnea   . Stress-induced cardiomyopathy 09/27/98   WNL, EF 64%  . Urosepsis 01/01-01/05/10   Hospitalization   Past Surgical History:  Procedure Laterality Date  . COLONOSCOPY  07/2013   1 benign polyp rpt 10 yrs [name])  . KNEE ARTHROSCOPY Left 1988  . KNEE SURGERY Right 05/21/03  Right, Dr. Marlou Sa, med meniscus tear via MRI  . LEFT HEART CATH AND CORONARY ANGIOGRAPHY N/A 04/01/2017   Procedure: LEFT HEART CATH AND CORONARY ANGIOGRAPHY;  Surgeon: Martinique, Peter M, MD;  Location: [facility];  Service: Cardiovascular;  Laterality: N/A;  . MYELOGRAM  08/05   bulging disc   Social History   Tobacco Use  . Smoking status: Former Smoker    Quit date: 04/20/1974    Years since quitting: 45.2  . Smokeless tobacco: Never Used  . Tobacco comment: quit over 20 years  Substance Use Topics  . Alcohol use: Yes    Alcohol/week: 3.0 standard drinks    Types: 3 Cans of beer  per week    Comment: occasional  . Drug use: No   Family History  Problem Relation Age of Onset  . Heart disease Mother        CHF  . Diabetes Mother   . Heart disease Father        CHF  . Hypertension Father   . Migraines Brother        severe headaches from arsenic in the past from  wood that was treated on his deck  . CAD Brother 18       stent  . Cancer Maternal Uncle        unsure  . Stroke Neg Hx   . Colon cancer Neg Hx    Allergies  Allergen Reactions  . Victoza [Liraglutide] Nausea Only    Irritation around injection site   Current Outpatient Medications on File Prior to Visit  Medication Sig Dispense Refill  . ASPIRIN 81 PO Take 1 tablet by mouth daily.    . Cholecalciferol (VITAMIN D) 2000 units tablet Take 2,000 Units by mouth daily.    . finasteride (PROSCAR) 5 MG tablet Take 1 tablet (5 mg total) by mouth daily. 90 tablet 3  . insulin aspart (NOVOLOG FLEXPEN) 100 UNIT/ML FlexPen Inject 35 Units into the skin 3 (three) times daily with meals. And pen needles 4/day 45 pen 3  . Insulin Glargine (BASAGLAR KWIKPEN) 100 UNIT/ML SOPN Inject 1 mL (100 Units total) into the skin daily. 40 pen 3  . Insulin Pen Needle (B-D ULTRAFINE III SHORT PEN) 31G X 8 MM MISC 1 Device by Other route 4 (four) times daily. USE AS DIRECTED DAILY 120 each 11  . losartan (COZAAR) 25 MG tablet Take 1 tablet (25 mg total) by mouth daily. 90 tablet 3  . metFORMIN (GLUCOPHAGE) 1000 MG tablet Take 1,000 mg by mouth daily with breakfast.    . metoprolol tartrate (LOPRESSOR) 25 MG tablet Take 0.5 tablets (12.5 mg total) by mouth 2 (two) times daily. 30 tablet 8  . Multiple Vitamin (MULTIVITAMIN WITH MINERALS) TABS tablet Take 1 tablet by mouth daily.    [name] omeprazole (PRILOSEC) 40 MG capsule Take 1 capsule (40 mg total) by mouth daily. Daily for 3 weeks then as needed 30 capsule 3  . ONETOUCH ULTRA test strip USE TO CHECK SUGARS DAILY  AS DIRECTED 100 strip 3  . probenecid (BENEMID) 500 MG tablet  Take 0.5 tablets (250 mg total) by mouth every other day. 45 tablet 3  . rivaroxaban (XARELTO) 10 MG TABS tablet Take 1 tablet (10 mg total) by mouth daily. 90 tablet 4  . rosuvastatin (CRESTOR) 40 MG tablet TAKE 1 TABLET DAILY. PLEASE SCHEDULE ANNUAL APPT WITH DR. Martinique FOR REFILLS. [phone] 30 tablet 3  . tamsulosin (FLOMAX) 0.4 MG CAPS capsule Take  1 capsule (0.4 mg total) by mouth daily. 90 capsule 3  . vitamin C (ASCORBIC ACID) 500 MG tablet Take 500 mg by mouth every evening.      No current facility-administered medications on file prior to visit.    Review of Systems  Constitutional: Negative for activity change, appetite change, fatigue, fever and unexpected weight change.  HENT: Negative for congestion, rhinorrhea, sore throat and trouble swallowing.   Eyes: Negative for pain, redness, itching and visual disturbance.  Respiratory: Negative for cough, chest tightness, shortness of breath and wheezing.   Cardiovascular: Negative for chest pain and palpitations.  Gastrointestinal: Negative for abdominal pain, blood in stool, constipation, diarrhea and nausea.  Endocrine: Negative for cold intolerance, heat intolerance, polydipsia and polyuria.  Genitourinary: Negative for difficulty urinating, dysuria, frequency and urgency.  Musculoskeletal: Negative for arthralgias, joint swelling and myalgias.  Skin: Negative for pallor and rash.       Lump of buttock is tender  Neurological: Negative for dizziness, tremors, weakness, numbness and headaches.  Hematological: Negative for adenopathy. Does not bruise/bleed easily.  Psychiatric/Behavioral: Negative for decreased concentration and dysphoric mood. The patient is not nervous/anxious.        Objective:   Physical Exam Constitutional:      General: He is not in acute distress.    Appearance: Normal appearance. He is obese. He is not ill-appearing or diaphoretic.  Eyes:     General: No scleral icterus. Cardiovascular:     Rate  and Rhythm: Regular rhythm. Tachycardia present.  Pulmonary:     Effort: Pulmonary effort is normal. No respiratory distress.  Abdominal:     General: Abdomen is flat. Bowel sounds are normal. There is no distension.     Palpations: Abdomen is soft. There is no mass.     Tenderness: There is no abdominal tenderness.  Skin:    General: Skin is warm and dry.     Findings: Erythema present. No rash.     Comments: 2-3 oval area of firm induration and erythema and warmth on upper L buttock near cleft Mildly tender Not fluctuant  No drainage   Neurological:     Mental Status: He is alert.  Psychiatric:        Mood and Affect: Mood normal.           Assessment & Plan:   Problem List Items Addressed This Visit      Other   Cellulitis and abscess of buttock    Area of erythema and induration over upper L buttock near cleft that is firm and non-fluctuant  In the right area for pilonidal process but not draining and no fistula seen (also no prior hx of that)  Suspect cellulitis or early abscess H/o mrsa in past Will double cover with augmentin and doxycycline  Continue warm compress or sitz baths  Allow to drain if it does become softer F/u with pcp for re check next wk  inst to call office if symptoms worsen over the weekend

## 2019-07-07 NOTE — Patient Instructions (Addendum)
Keep area clean with soap and water  Continue warm compresses or sitz baths   Take the antibiotics (doxycycline and augmentin) as directed   Stop at check out to schedule follow up with Dr Darnell Level   If suddenly worsening of symptoms- large/painful - call to talk to on call nurse

## 2019-07-09 NOTE — Assessment & Plan Note (Addendum)
Area of erythema and induration over upper L buttock near cleft that is firm and non-fluctuant  In the right area for pilonidal process but not draining and no fistula seen (also no prior hx of that)  Suspect cellulitis or early abscess H/o mrsa in past Will double cover with augmentin and doxycycline  Continue warm compress or sitz baths  Allow to drain if it does become softer F/u with pcp for re check next wk  inst to call office if symptoms worsen over the weekend

## 2019-07-13 ENCOUNTER — Encounter: Payer: Self-pay | Admitting: Family Medicine

## 2019-07-13 ENCOUNTER — Other Ambulatory Visit: Payer: Self-pay

## 2019-07-13 ENCOUNTER — Ambulatory Visit (INDEPENDENT_AMBULATORY_CARE_PROVIDER_SITE_OTHER): Payer: Medicare HMO | Admitting: Family Medicine

## 2019-07-13 VITALS — BP 122/58 | HR 87 | Temp 97.6°F | Ht 66.5 in | Wt 217.3 lb

## 2019-07-13 DIAGNOSIS — E1165 Type 2 diabetes mellitus with hyperglycemia: Secondary | ICD-10-CM

## 2019-07-13 DIAGNOSIS — E1140 Type 2 diabetes mellitus with diabetic neuropathy, unspecified: Secondary | ICD-10-CM

## 2019-07-13 DIAGNOSIS — L03317 Cellulitis of buttock: Secondary | ICD-10-CM | POA: Diagnosis not present

## 2019-07-13 DIAGNOSIS — I82412 Acute embolism and thrombosis of left femoral vein: Secondary | ICD-10-CM

## 2019-07-13 DIAGNOSIS — L0231 Cutaneous abscess of buttock: Secondary | ICD-10-CM

## 2019-07-13 DIAGNOSIS — IMO0002 Reserved for concepts with insufficient information to code with codable children: Secondary | ICD-10-CM

## 2019-07-13 MED ORDER — AMOXICILLIN-POT CLAVULANATE 875-125 MG PO TABS: 1.0000 | ORAL_TABLET | Freq: Two times a day (BID) | ORAL | 0 refills | Status: DC

## 2019-07-13 MED ORDER — DOXYCYCLINE HYCLATE 100 MG PO TABS: 100.0000 mg | ORAL_TABLET | Freq: Two times a day (BID) | ORAL | 0 refills | Status: DC

## 2019-07-13 NOTE — Assessment & Plan Note (Signed)
1st unprovoked DVT, saw Magrinat. Completed 1+ yr antiocagulation (eliquis) then became unaffordable. Has message out to neuro regarding need for anticoagulant

## 2019-07-13 NOTE — Progress Notes (Signed)
This visit was conducted in person.  BP (!) 122/58 (BP Location: Right Arm, Patient Position: Sitting, Cuff Size: Large)   Pulse 87   Temp 97.6 F (36.4 C) (Temporal)   Ht 5' 6.5" (1.689 m)   Wt 217 lb 5 oz (98.6 kg)   SpO2 95%   BMI 34.55 kg/m    CC: buttock cellulitis/abscess f/u Subjective:    Patient ID: Akshat Liter, male    DOB: 10-25-1950, 69 y.o.   MRN: [id number]  HPI: Gordon Trisler is a 69 y.o. male presenting on 07/13/2019 for Cellulitis (Here for 1 wk f/u buttock cellulitis and abscess. )   See prior note for details. Saw Dr Glori Bickers last week.for L buttock knot. Treated for cellulitis/abscess with augmentin and doxycycline 7d course and warm compresses/sitz baths. H/o MRSA in the past.   Area still tender but seems to be getting better. Denies fevers/chills.  Area never drained or came to a head.  He continues sitz baths.  Tolerating antibiotic ok - some intermittent loose stools with this.   He had 1st unprovoked extensive DVT 04/2018. On xarelto until a few weeks ago - stopped due to cost.      Relevant past medical, surgical, family and social history reviewed and updated as indicated. Interim medical history since our last visit reviewed. Allergies and medications reviewed and updated. Outpatient Medications Prior to Visit  Medication Sig Dispense Refill  . ASPIRIN 81 PO Take 1 tablet by mouth daily.    . Cholecalciferol (VITAMIN D) 2000 units tablet Take 2,000 Units by mouth daily.    . finasteride (PROSCAR) 5 MG tablet Take 1 tablet (5 mg total) by mouth daily. 90 tablet 3  . insulin aspart (NOVOLOG FLEXPEN) 100 UNIT/ML FlexPen Inject 35 Units into the skin 3 (three) times daily with meals. And pen needles 4/day 45 pen 3  . Insulin Glargine (BASAGLAR KWIKPEN) 100 UNIT/ML SOPN Inject 1 mL (100 Units total) into the skin daily. 40 pen 3  . Insulin Pen Needle (B-D ULTRAFINE III SHORT PEN) 31G X 8 MM MISC 1 Device by Other route 4 (four) times daily. USE AS  DIRECTED DAILY 120 each 11  . losartan (COZAAR) 25 MG tablet Take 1 tablet (25 mg total) by mouth daily. 90 tablet 3  . metFORMIN (GLUCOPHAGE) 1000 MG tablet Take 1,000 mg by mouth daily with breakfast.    . Multiple Vitamin (MULTIVITAMIN WITH MINERALS) TABS tablet Take 1 tablet by mouth daily.    [name] omeprazole (PRILOSEC) 40 MG capsule Take 1 capsule (40 mg total) by mouth daily. Daily for 3 weeks then as needed 30 capsule 3  . ONETOUCH ULTRA test strip USE TO CHECK SUGARS DAILY  AS DIRECTED 100 strip 3  . probenecid (BENEMID) 500 MG tablet Take 0.5 tablets (250 mg total) by mouth every other day. 45 tablet 3  . rivaroxaban (XARELTO) 10 MG TABS tablet Take 1 tablet (10 mg total) by mouth daily. 90 tablet 4  . rosuvastatin (CRESTOR) 40 MG tablet TAKE 1 TABLET DAILY. PLEASE SCHEDULE ANNUAL APPT WITH DR. Martinique FOR REFILLS. [phone] 30 tablet 3  . tamsulosin (FLOMAX) 0.4 MG CAPS capsule Take 1 capsule (0.4 mg total) by mouth daily. 90 capsule 3  . vitamin C (ASCORBIC ACID) 500 MG tablet Take 500 mg by mouth every evening.     [name] amoxicillin-clavulanate (AUGMENTIN) 875-125 MG tablet Take 1 tablet by mouth 2 (two) times daily. 14 tablet 0  . doxycycline (VIBRA-TABS) 100 MG tablet Take  1 tablet (100 mg total) by mouth 2 (two) times daily. 14 tablet 0  . metoprolol tartrate (LOPRESSOR) 25 MG tablet Take 0.5 tablets (12.5 mg total) by mouth 2 (two) times daily. 30 tablet 8   No facility-administered medications prior to visit.     Per HPI unless specifically indicated in ROS section below Review of Systems Objective:    BP (!) 122/58 (BP Location: Right Arm, Patient Position: Sitting, Cuff Size: Large)   Pulse 87   Temp 97.6 F (36.4 C) (Temporal)   Ht 5' 6.5" (1.689 m)   Wt 217 lb 5 oz (98.6 kg)   SpO2 95%   BMI 34.55 kg/m   Wt Readings from Last 3 Encounters:  07/13/19 217 lb 5 oz (98.6 kg)  07/07/19 221 lb 6 oz (100.4 kg)  06/19/19 217 lb 12.8 oz (98.8 kg)    Physical Exam Vitals  and nursing note reviewed.  Constitutional:      Appearance: Normal appearance. He is not ill-appearing.  Skin:    General: Skin is warm and dry.     Findings: Abscess and erythema present.          Comments: Indurated skin about 3cm diameter medial L buttock with surrounding erythema, no fluctuance. No midline inflammation/swelling   Neurological:     Mental Status: He is alert.  Psychiatric:        Mood and Affect: Mood normal.        Behavior: Behavior normal.        Results for orders placed or performed in visit on 06/19/19  Fructosamine  Result Value Ref Range   Fructosamine 313 (H) 205 - 285 umol/L  POCT HgB A1C  Result Value Ref Range   Hemoglobin A1C 8.8 (A) 4.0 - 5.6 %   HbA1c POC (<> result, manual entry)     HbA1c, POC (prediabetic range)     HbA1c, POC (controlled diabetic range)     Assessment & Plan:  This visit occurred during the SARS-CoV-2 public health emergency.  Safety protocols were in place, including screening questions prior to the visit, additional usage of staff PPE, and extensive cleaning of exam room while observing appropriate contact time as indicated for disinfecting solutions.   Problem List Items Addressed This Visit    Type 2 diabetes, uncontrolled, with neuropathy ([place])   DVT of deep femoral vein, left (HCC)    1st unprovoked DVT, saw Mag[facility]. Completed 1+ yr antiocagulation (eliquis) then became unaffordable. Has message out to neuro regarding need for anticoagulant      Cellulitis and abscess of buttock - Primary    Ongoing, seems to be improving. Will extend antibiotic course another 5 days. Continue sitz baths, warm compresses to facilitate drainage. No drainin as of yet. Red flags to seek further care reviewed.           Meds ordered this encounter  Medications  . amoxicillin-clavulanate (AUGMENTIN) 875-125 MG tablet    Sig: Take 1 tablet by mouth 2 (two) times daily.    Dispense:  10 tablet    Refill:  0  . doxycycline  (VIBRA-TABS) 100 MG tablet    Sig: Take 1 tablet (100 mg total) by mouth 2 (two) times daily.    Dispense:  10 tablet    Refill:  0   No orders of the defined types were placed in this encounter.   Follow up plan: Return if symptoms worsen or fail to improve.  [name], MD

## 2019-07-13 NOTE — Assessment & Plan Note (Addendum)
Ongoing, seems to be improving. Will extend antibiotic course another 5 days. Continue sitz baths, warm compresses to facilitate drainage. No drainin as of yet. Red flags to seek further care reviewed.

## 2019-07-13 NOTE — Patient Instructions (Signed)
Skin infection is slowly improving - extend antibiotic course another 5 days. Sent to pharmacy.  Let us know if any worsening or new symptoms developing. Try to increase sitz baths to 3 times a day.

## 2019-07-17 ENCOUNTER — Other Ambulatory Visit: Payer: Self-pay | Admitting: Oncology

## 2019-07-17 NOTE — Progress Notes (Signed)
Alan Morrison contacted [place] to let us know that the cost of his rivaroxaban has risen astronomically.  We need a little bit more information.  Possibly we may be able to help him obtain the drug at a cheaper cost or switch him to a different anticoagulant.  Certainly he could go off anticoagulation but I am concerned he remains at high risk of developing further DVTs or PEs.

## 2019-07-19 ENCOUNTER — Encounter: Payer: Self-pay | Admitting: *Deleted

## 2019-07-24 ENCOUNTER — Other Ambulatory Visit: Payer: Self-pay | Admitting: Oncology

## 2019-07-31 ENCOUNTER — Other Ambulatory Visit: Payer: Self-pay | Admitting: Oncology

## 2019-08-02 ENCOUNTER — Telehealth: Payer: Self-pay

## 2019-08-02 NOTE — Telephone Encounter (Signed)
RN left voicemail regarding follow up on medication.

## 2019-08-03 ENCOUNTER — Encounter: Payer: Self-pay | Admitting: Oncology

## 2019-08-15 ENCOUNTER — Other Ambulatory Visit: Payer: Self-pay | Admitting: Cardiology

## 2019-09-04 ENCOUNTER — Telehealth: Payer: Self-pay | Admitting: Endocrinology

## 2019-09-04 NOTE — Telephone Encounter (Signed)
Wanted to be 100% sure this pt is not to be dismissed. Back in the beginning of February, we did dismiss. He came in on 06/19/19 as allowed per the 30 day limit and on that AVS you wrote for him to come back in 2 months. I do have it documented that I spoke with you regarding this but wanted to have it in writing from you as well

## 2019-09-04 NOTE — Telephone Encounter (Signed)
Please cancel next appt.  Thank you

## 2019-09-05 ENCOUNTER — Encounter: Payer: Self-pay | Admitting: Family Medicine

## 2019-09-05 DIAGNOSIS — IMO0002 Reserved for concepts with insufficient information to code with codable children: Secondary | ICD-10-CM

## 2019-09-05 NOTE — Telephone Encounter (Signed)
Pts appt has been removed.  He has been made aware he is dismissed from the practice.

## 2019-09-06 ENCOUNTER — Ambulatory Visit: Payer: Medicare HMO | Admitting: Endocrinology

## 2019-09-29 DIAGNOSIS — R69 Illness, unspecified: Secondary | ICD-10-CM | POA: Diagnosis not present

## 2019-10-09 ENCOUNTER — Encounter: Payer: Self-pay | Admitting: Family Medicine

## 2019-10-19 DIAGNOSIS — H52223 Regular astigmatism, bilateral: Secondary | ICD-10-CM | POA: Diagnosis not present

## 2019-10-19 DIAGNOSIS — H25813 Combined forms of age-related cataract, bilateral: Secondary | ICD-10-CM | POA: Diagnosis not present

## 2019-10-19 DIAGNOSIS — H5203 Hypermetropia, bilateral: Secondary | ICD-10-CM | POA: Diagnosis not present

## 2019-10-19 DIAGNOSIS — E119 Type 2 diabetes mellitus without complications: Secondary | ICD-10-CM | POA: Diagnosis not present

## 2019-10-19 DIAGNOSIS — H2513 Age-related nuclear cataract, bilateral: Secondary | ICD-10-CM | POA: Diagnosis not present

## 2019-10-19 DIAGNOSIS — H524 Presbyopia: Secondary | ICD-10-CM | POA: Diagnosis not present

## 2019-10-19 LAB — HM DIABETES EYE EXAM

## 2019-10-23 ENCOUNTER — Other Ambulatory Visit: Payer: Self-pay | Admitting: Endocrinology

## 2019-10-26 DIAGNOSIS — R69 Illness, unspecified: Secondary | ICD-10-CM | POA: Diagnosis not present

## 2019-11-11 NOTE — Progress Notes (Deleted)
Cardiology Office Note    Date:  11/11/2019   ID:  Alan Morrison, DOB [birth year], MRN [id number]  PCP:  [name], MD  Cardiologist:  Dr. Martinique  No chief complaint on file.   History of Present Illness:  Alan Morrison is a 69 y.o. male with PMH of DM II, HLD and OSA.  He had a history of nonobstructive CAD by cardiac cath in 2003.  Myoview in 2000 and was normal.  He also had a normal echocardiogram in 2012 as well. In 2018 he developed chest pain.   He was started on 50 mg Toprol-XL.  Coronary CT showed significant blockage in the mid RCA, mid LAD and the distal LAD.   He underwent cardiac catheterization on 04/01/2017 which showed  nonobstructive disease with 50% mid LAD, 30% proximal LAD, 25% mid RCA lesion.   Medical therapy was recommended.  Patient reports that in October he developed swelling in his left leg with discomfort. He just put up with it until January when it really became more painful to walk. LE venous doppler showed a left femoral DVT. He was started on Xarelto. Seen by Dr Jana Hakim. No clear trigger for DVT. Since then still has occasional soreness in leg. Swelling has largely resolved. No significant chest pain or dyspnea. BS control has improved significantly with A1c down to 7.1%.  He did have screening Abd [place] with no aneurysm this year.    Past Medical History:  Diagnosis Date  . Anemia, iron deficiency, inadequate dietary intake   . 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   by [place]  . CAD (coronary artery disease) 2003   cath - Min Dz, EF 65%, Adm.R/O'd  . Cervical spondylosis without myelopathy   . Chest pain, atypical   . Choroidal nevus 01/22/2011   left, yearly eye exam, no diabetic retinopathy  . Diabetes mellitus type II   . Dyspepsia   . Ex-smoker   . Fatty liver 02/2016   by [place]  . GERD (gastroesophageal reflux disease)   . Headache(784.0)   . Heart murmur    Hx of  . History of MRI of brain and brain stem 09/06  . History of MRSA infection  12/2013   R knee boil  . HLD (hyperlipidemia)   . Hyperuricemia   . Obesity   . Sleep apnea   . Stress-induced cardiomyopathy 09/27/98   WNL, EF 64%  . Urosepsis 01/01-01/05/10   Hospitalization    Past Surgical History:  Procedure Laterality Date  . COLONOSCOPY  07/2013   1 benign polyp rpt 10 yrs [name])  . KNEE ARTHROSCOPY Left 1988  . KNEE SURGERY Right 05/21/03   Right, Dr. Marlou Sa, med meniscus tear via MRI  . LEFT HEART CATH AND CORONARY ANGIOGRAPHY N/A 04/01/2017   Procedure: LEFT HEART CATH AND CORONARY ANGIOGRAPHY;  Surgeon: Martinique, Javyn Havlin M, MD;  Location: [facility];  Service: Cardiovascular;  Laterality: N/A;  . MYELOGRAM  08/05   bulging disc    Current Medications: Outpatient Medications Prior to Visit  Medication Sig Dispense Refill  . amoxicillin-clavulanate (AUGMENTIN) 875-125 MG tablet Take 1 tablet by mouth 2 (two) times daily. 10 tablet 0  . ASPIRIN 81 PO Take 1 tablet by mouth daily.    . Cholecalciferol (VITAMIN D) 2000 units tablet Take 2,000 Units by mouth daily.    [name] doxycycline (VIBRA-TABS) 100 MG tablet Take 1 tablet (100 mg total) by mouth 2 (two) times daily. 10 tablet 0  . finasteride (PROSCAR)  5 MG tablet Take 1 tablet (5 mg total) by mouth daily. 90 tablet 3  . insulin aspart (NOVOLOG FLEXPEN) 100 UNIT/ML FlexPen Inject 35 Units into the skin 3 (three) times daily with meals. And pen needles 4/day 45 pen 3  . Insulin Glargine (BASAGLAR KWIKPEN) 100 UNIT/ML SOPN Inject 1 mL (100 Units total) into the skin daily. 40 pen 3  . Insulin Pen Needle (B-D ULTRAFINE III SHORT PEN) 31G X 8 MM MISC 1 Device by Other route 4 (four) times daily. USE AS DIRECTED DAILY 120 each 11  . losartan (COZAAR) 25 MG tablet Take 1 tablet (25 mg total) by mouth daily. 90 tablet 3  . metFORMIN (GLUCOPHAGE) 1000 MG tablet Take 1,000 mg by mouth daily with breakfast.    . metoprolol tartrate (LOPRESSOR) 25 MG tablet TAKE 0.5 TABLETS (12.5 MG TOTAL) BY MOUTH 2 (TWO) TIMES  DAILY. 90 tablet 3  . Multiple Vitamin (MULTIVITAMIN WITH MINERALS) TABS tablet Take 1 tablet by mouth daily.    [name] omeprazole (PRILOSEC) 40 MG capsule Take 1 capsule (40 mg total) by mouth daily. Daily for 3 weeks then as needed 30 capsule 3  . ONETOUCH ULTRA test strip USE TO CHECK SUGARS DAILY  AS DIRECTED 100 strip 3  . probenecid (BENEMID) 500 MG tablet Take 0.5 tablets (250 mg total) by mouth every other day. 45 tablet 3  . rivaroxaban (XARELTO) 10 MG TABS tablet Take 1 tablet (10 mg total) by mouth daily. 90 tablet 4  . rosuvastatin (CRESTOR) 40 MG tablet TAKE 1 TABLET DAILY. PLEASE SCHEDULE ANNUAL APPT WITH DR. Martinique FOR REFILLS. [phone] 30 tablet 3  . tamsulosin (FLOMAX) 0.4 MG CAPS capsule Take 1 capsule (0.4 mg total) by mouth daily. 90 capsule 3  . vitamin C (ASCORBIC ACID) 500 MG tablet Take 500 mg by mouth every evening.      No facility-administered medications prior to visit.     Allergies:   Victoza [liraglutide]   Social History   Socioeconomic History  . Marital status: Married    Spouse name: Not on file  . Number of children: 2  . Years of education: Not on file  . Highest education level: Not on file  Occupational History  . Occupation: Maintenance    Employer: [employer]  Tobacco Use  . Smoking status: Former Smoker    Quit date: 04/20/1974    Years since quitting: 45.5  . Smokeless tobacco: Never Used  . Tobacco comment: quit over 20 years  Vaping Use  . Vaping Use: Never used  Substance and Sexual Activity  . Alcohol use: Yes    Alcohol/week: 3.0 standard drinks    Types: 3 Cans of beer per week    Comment: occasional  . Drug use: No  . Sexual activity: Not on file  Other Topics Concern  . Not on file  Social History Narrative   Lives with wife   Occupation: equipment maintenance   Activity: no regular exercise - hunts   Diet: good water, fruits/vegetables   Social Determinants of Health   Financial Resource Strain:   .  Difficulty of Paying Living Expenses:   Food Insecurity:   . Worried About Charity fundraiser in the Last Year:   . Arboriculturist in the Last Year:   Transportation Needs:   . Film/video editor (Medical):   [name] Lack of Transportation (Non-Medical):   Physical Activity:   . Days of Exercise per Week:   .  Minutes of Exercise per Session:   Stress:   . Feeling of Stress :   Social Connections:   . Frequency of Communication with Friends and Family:   . Frequency of Social Gatherings with Friends and Family:   . Attends Religious Services:   . Active Member of Clubs or Organizations:   . Attends Archivist Meetings:   [name] Marital Status:      Family History:  The patient's family history includes CAD (age of onset: 100) in his brother; Cancer in his maternal uncle; Diabetes in his mother; Heart disease in his father and mother; Hypertension in his father; Migraines in his brother.   ROS:   Please see the history of present illness.    ROS All other systems reviewed and are negative.   PHYSICAL EXAM:   VS:  There were no vitals taken for this visit.   GEN: Well nourished, well developed, in no acute distress  HEENT: normal  Neck: no JVD, carotid bruits, or masses Cardiac: RRR; no murmurs, rubs, or gallops,no edema  Respiratory:  clear to auscultation bilaterally, normal work of breathing GI: soft, nontender, nondistended, + BS MS: no deformity or atrophy  Skin: warm and dry, no rash Neuro:  Alert and Oriented x 3, Strength and sensation are intact Psych: euthymic mood, full affect  Wt Readings from Last 3 Encounters:  07/13/19 217 lb 5 oz (98.6 kg)  07/07/19 221 lb 6 oz (100.4 kg)  06/19/19 217 lb 12.8 oz (98.8 kg)      Studies/Labs Reviewed:   EKG:  EKG is  ordered today. NSR with normal Ecg. I have personally reviewed and interpreted this study.   Recent Labs: 05/26/2019: ALT 51; BUN 30; Creatinine, Ser 1.35; Hemoglobin 15.4; Platelets 222.0; Potassium  5.2 No hemolysis seen; Sodium 142   Lipid Panel    Component Value Date/Time   CHOL 92 05/26/2019 0922   CHOL 112 11/01/2018 0000   TRIG 112.0 05/26/2019 0922   HDL 28.30 (L) 05/26/2019 0922   HDL 37 (L) 11/01/2018 0000   CHOLHDL 3 05/26/2019 0922   VLDL 22.4 05/26/2019 0922   LDLCALC 42 05/26/2019 0922   LDLCALC 59 11/01/2018 0000    Additional studies/ records that were reviewed today include:   Coronary CT 03/19/2017 IMPRESSION: 1. Coronary artery calcium score 790 Agatston units, placing the patient in the 89th percentile for age and gender. This suggests high risk for future cardiac events.  2. Extensive plaque in the proximal to mid LAD, possible area of moderate stenosis in the mid LAD.  3.  Moderate OM1 with moderate stenosis proximally.  4.  Mild to possibly moderate mid RCA stenosis.  See CT report from 11/28. This is the FFR result from the 11/28 study.  FFR 0.82 in mid RCA, suggesting borderline significant stenosis. FFR 0.76 in the PDA, suggesting hemodynamically significant disease involving the PDA.     Cath 04/01/2017 Conclusion     Mid LAD to Dist LAD lesion is 45% stenosed.  Mid LAD lesion is 50% stenosed.  Prox LAD lesion is 35% stenosed.  Mid RCA lesion is 25% stenosed.  Ost RPDA lesion is 25% stenosed.  The left ventricular systolic function is normal.  LV end diastolic pressure is normal.  The left ventricular ejection fraction is 55-65% by visual estimate.   1. Nonobstructive CAD 2. Normal LV function.  3. Normal LVEDP  Plan: although CT FFR was abnormal there appears to be nonobstructive disease by angiography. I would  recommend continued medical therapy and risk factor modification       ASSESSMENT:    No diagnosis found.   PLAN:  In order of problems listed above:  1. CAD: Nonobstructive disease noted on cardiac catheterization 2003, s/p cardiac catheterization Dec 2018 due to abnormal coronary CT, this  revealed a mild progression of disease compared to the previous cath, however no critical disease noted.  He is asymptomatic.  Continue aggressive risk factor modification.   Continue aspirin, metoprolol, high dose Crestor.   2. Hyperlipidemia: plan to repeat fasting lipid panel.   3. DM 2:   Improved control with A1c 7.1%.  4. Obstructive sleep apnea on CPAP: Compliant with CPAP machine.  5.   HTN well controlled.   6.   Left femoral DVT- unprovoked. To follow up with Dr Jana Hakim later this month with repeat LE doppler. To decide on whether to continue long term anticoagulation.   Medication Adjustments/Labs and Tests Ordered: Current medicines are reviewed at length with the patient today.  Concerns regarding medicines are outlined above.  Medication changes, Labs and Tests ordered today are listed in the Patient Instructions below. There are no Patient Instructions on file for this visit.   Signed, Jovita Persing Martinique, MD  11/11/2019 10:07 AM    [facility] [place], [place], [place]  [address] Phone: [phone]; Fax: [phone]

## 2019-11-12 ENCOUNTER — Other Ambulatory Visit: Payer: Self-pay

## 2019-11-12 ENCOUNTER — Inpatient Hospital Stay (HOSPITAL_COMMUNITY)
Admission: EM | Disposition: A | Discharge status: Home / Self Care | Payer: Self-pay | Source: Home / Self Care | Attending: Interventional Cardiology

## 2019-11-12 ENCOUNTER — Inpatient Hospital Stay (HOSPITAL_COMMUNITY)
Admission: EM | Admit: 2019-11-12 | Discharge: 2019-11-14 | DRG: 247 | Disposition: A | Discharge status: Home / Self Care | Payer: Medicare HMO | Attending: Interventional Cardiology | Admitting: Interventional Cardiology

## 2019-11-12 ENCOUNTER — Encounter (HOSPITAL_COMMUNITY): Payer: Self-pay | Admitting: Emergency Medicine

## 2019-11-12 ENCOUNTER — Emergency Department (HOSPITAL_COMMUNITY): Payer: Medicare HMO

## 2019-11-12 DIAGNOSIS — Z20822 Contact with and (suspected) exposure to covid-19: Secondary | ICD-10-CM | POA: Diagnosis not present

## 2019-11-12 DIAGNOSIS — E669 Obesity, unspecified: Secondary | ICD-10-CM | POA: Diagnosis present

## 2019-11-12 DIAGNOSIS — Z955 Presence of coronary angioplasty implant and graft: Secondary | ICD-10-CM

## 2019-11-12 DIAGNOSIS — I5181 Takotsubo syndrome: Secondary | ICD-10-CM | POA: Diagnosis present

## 2019-11-12 DIAGNOSIS — K219 Gastro-esophageal reflux disease without esophagitis: Secondary | ICD-10-CM | POA: Diagnosis present

## 2019-11-12 DIAGNOSIS — Z79899 Other long term (current) drug therapy: Secondary | ICD-10-CM | POA: Diagnosis not present

## 2019-11-12 DIAGNOSIS — I10 Essential (primary) hypertension: Secondary | ICD-10-CM

## 2019-11-12 DIAGNOSIS — E1169 Type 2 diabetes mellitus with other specified complication: Secondary | ICD-10-CM | POA: Diagnosis not present

## 2019-11-12 DIAGNOSIS — Z6831 Body mass index (BMI) 31.0-31.9, adult: Secondary | ICD-10-CM

## 2019-11-12 DIAGNOSIS — I213 ST elevation (STEMI) myocardial infarction of unspecified site: Secondary | ICD-10-CM

## 2019-11-12 DIAGNOSIS — E785 Hyperlipidemia, unspecified: Secondary | ICD-10-CM | POA: Diagnosis not present

## 2019-11-12 DIAGNOSIS — Z7982 Long term (current) use of aspirin: Secondary | ICD-10-CM

## 2019-11-12 DIAGNOSIS — I255 Ischemic cardiomyopathy: Secondary | ICD-10-CM | POA: Diagnosis present

## 2019-11-12 DIAGNOSIS — Z8614 Personal history of Methicillin resistant Staphylococcus aureus infection: Secondary | ICD-10-CM | POA: Diagnosis not present

## 2019-11-12 DIAGNOSIS — I2119 ST elevation (STEMI) myocardial infarction involving other coronary artery of inferior wall: Principal | ICD-10-CM

## 2019-11-12 DIAGNOSIS — Z794 Long term (current) use of insulin: Secondary | ICD-10-CM | POA: Diagnosis not present

## 2019-11-12 DIAGNOSIS — I2582 Chronic total occlusion of coronary artery: Secondary | ICD-10-CM | POA: Diagnosis present

## 2019-11-12 DIAGNOSIS — Z86718 Personal history of other venous thrombosis and embolism: Secondary | ICD-10-CM | POA: Diagnosis not present

## 2019-11-12 DIAGNOSIS — Z888 Allergy status to other drugs, medicaments and biological substances status: Secondary | ICD-10-CM

## 2019-11-12 DIAGNOSIS — R0789 Other chest pain: Secondary | ICD-10-CM | POA: Diagnosis not present

## 2019-11-12 DIAGNOSIS — G4730 Sleep apnea, unspecified: Secondary | ICD-10-CM

## 2019-11-12 DIAGNOSIS — E1165 Type 2 diabetes mellitus with hyperglycemia: Secondary | ICD-10-CM

## 2019-11-12 DIAGNOSIS — Z87891 Personal history of nicotine dependence: Secondary | ICD-10-CM

## 2019-11-12 DIAGNOSIS — K760 Fatty (change of) liver, not elsewhere classified: Secondary | ICD-10-CM | POA: Diagnosis present

## 2019-11-12 DIAGNOSIS — E1140 Type 2 diabetes mellitus with diabetic neuropathy, unspecified: Secondary | ICD-10-CM | POA: Diagnosis not present

## 2019-11-12 DIAGNOSIS — Z8249 Family history of ischemic heart disease and other diseases of the circulatory system: Secondary | ICD-10-CM

## 2019-11-12 DIAGNOSIS — I2510 Atherosclerotic heart disease of native coronary artery without angina pectoris: Secondary | ICD-10-CM | POA: Diagnosis not present

## 2019-11-12 DIAGNOSIS — N1831 Chronic kidney disease, stage 3a: Secondary | ICD-10-CM | POA: Diagnosis present

## 2019-11-12 DIAGNOSIS — Z7901 Long term (current) use of anticoagulants: Secondary | ICD-10-CM

## 2019-11-12 DIAGNOSIS — G4733 Obstructive sleep apnea (adult) (pediatric): Secondary | ICD-10-CM | POA: Diagnosis present

## 2019-11-12 DIAGNOSIS — E1122 Type 2 diabetes mellitus with diabetic chronic kidney disease: Secondary | ICD-10-CM | POA: Diagnosis not present

## 2019-11-12 DIAGNOSIS — I82412 Acute embolism and thrombosis of left femoral vein: Secondary | ICD-10-CM | POA: Diagnosis present

## 2019-11-12 DIAGNOSIS — Z833 Family history of diabetes mellitus: Secondary | ICD-10-CM

## 2019-11-12 DIAGNOSIS — E1142 Type 2 diabetes mellitus with diabetic polyneuropathy: Secondary | ICD-10-CM | POA: Diagnosis present

## 2019-11-12 DIAGNOSIS — I129 Hypertensive chronic kidney disease with stage 1 through stage 4 chronic kidney disease, or unspecified chronic kidney disease: Secondary | ICD-10-CM | POA: Diagnosis not present

## 2019-11-12 DIAGNOSIS — I2111 ST elevation (STEMI) myocardial infarction involving right coronary artery: Secondary | ICD-10-CM | POA: Diagnosis not present

## 2019-11-12 HISTORY — PX: LEFT HEART CATH AND CORONARY ANGIOGRAPHY: CATH118249

## 2019-11-12 HISTORY — PX: CORONARY/GRAFT ACUTE MI REVASCULARIZATION: CATH118305

## 2019-11-12 HISTORY — PX: CORONARY THROMBECTOMY: CATH118304

## 2019-11-12 HISTORY — DX: ST elevation (STEMI) myocardial infarction involving other coronary artery of inferior wall: I21.19

## 2019-11-12 LAB — CBC
HCT: 45.4 % (ref 39.0–52.0)
HCT: 41.1 % (ref 39.0–52.0)
Hemoglobin: 15.1 g/dL (ref 13.0–17.0)
Hemoglobin: 13.8 g/dL (ref 13.0–17.0)
MCH: 29.9 pg (ref 26.0–34.0)
MCH: 29.6 pg (ref 26.0–34.0)
MCHC: 33.3 g/dL (ref 30.0–36.0)
MCHC: 33.6 g/dL (ref 30.0–36.0)
MCV: 89.9 fL (ref 80.0–100.0)
MCV: 88.2 fL (ref 80.0–100.0)
Platelets: 210 10*3/uL (ref 150–400)
Platelets: 163 10*3/uL (ref 150–400)
RBC: 5.05 MIL/uL (ref 4.22–5.81)
RBC: 4.66 MIL/uL (ref 4.22–5.81)
RDW: 13.3 % (ref 11.5–15.5)
RDW: 13.4 % (ref 11.5–15.5)
WBC: 11.8 10*3/uL — ABNORMAL HIGH (ref 4.0–10.5)
WBC: 11.8 10*3/uL — ABNORMAL HIGH (ref 4.0–10.5)
nRBC: 0 % (ref 0.0–0.2)
nRBC: 0 % (ref 0.0–0.2)

## 2019-11-12 LAB — GLUCOSE, CAPILLARY
Glucose-Capillary: 81 mg/dL (ref 70–99)
Glucose-Capillary: 122 mg/dL — ABNORMAL HIGH (ref 70–99)

## 2019-11-12 LAB — COMPREHENSIVE METABOLIC PANEL
ALT: 60 U/L — ABNORMAL HIGH (ref 0–44)
AST: 126 U/L — ABNORMAL HIGH (ref 15–41)
Albumin: 3.8 g/dL (ref 3.5–5.0)
Alkaline Phosphatase: 43 U/L (ref 38–126)
Anion gap: 13 (ref 5–15)
BUN: 35 mg/dL — ABNORMAL HIGH (ref 8–23)
CO2: 19 mmol/L — ABNORMAL LOW (ref 22–32)
Calcium: 9.5 mg/dL (ref 8.9–10.3)
Chloride: 109 mmol/L (ref 98–111)
Creatinine, Ser: 1.63 mg/dL — ABNORMAL HIGH (ref 0.61–1.24)
GFR calc Af Amer: 49 mL/min — ABNORMAL LOW (ref >60)
GFR calc non Af Amer: 43 mL/min — ABNORMAL LOW (ref >60)
Glucose, Bld: 120 mg/dL — ABNORMAL HIGH (ref 70–99)
Potassium: 4.6 mmol/L (ref 3.5–5.1)
Sodium: 141 mmol/L (ref 135–145)
Total Bilirubin: 1.1 mg/dL (ref 0.3–1.2)
Total Protein: 7 g/dL (ref 6.5–8.1)

## 2019-11-12 LAB — LIPID PANEL
Cholesterol: 94 mg/dL (ref 0–200)
HDL: 35 mg/dL — ABNORMAL LOW (ref >40)
LDL Cholesterol: 47 mg/dL (ref 0–99)
Total CHOL/HDL Ratio: 2.7 RATIO
Triglycerides: 60 mg/dL (ref <150)
VLDL: 12 mg/dL (ref 0–40)

## 2019-11-12 LAB — HEMOGLOBIN A1C
Hgb A1c MFr Bld: 6.3 % — ABNORMAL HIGH (ref 4.8–5.6)
Mean Plasma Glucose: 134.11 mg/dL

## 2019-11-12 LAB — PROTIME-INR
INR: 1.2 (ref 0.8–1.2)
Prothrombin Time: 14.9 seconds (ref 11.4–15.2)

## 2019-11-12 LAB — TROPONIN I (HIGH SENSITIVITY)
Troponin I (High Sensitivity): 9809 ng/L (ref <18)
Troponin I (High Sensitivity): >27000 ng/L (ref <18)
Troponin I (High Sensitivity): >27000 ng/L (ref <18)

## 2019-11-12 LAB — CREATININE, SERUM
Creatinine, Ser: 1.53 mg/dL — ABNORMAL HIGH (ref 0.61–1.24)
GFR calc Af Amer: 53 mL/min — ABNORMAL LOW (ref >60)
GFR calc non Af Amer: 46 mL/min — ABNORMAL LOW (ref >60)

## 2019-11-12 LAB — APTT: aPTT: 30 seconds (ref 24–36)

## 2019-11-12 LAB — SARS CORONAVIRUS 2 BY RT PCR (HOSPITAL ORDER, PERFORMED IN ~~LOC~~ HOSPITAL LAB): SARS Coronavirus 2: NEGATIVE

## 2019-11-12 LAB — BRAIN NATRIURETIC PEPTIDE: B Natriuretic Peptide: 311.4 pg/mL — ABNORMAL HIGH (ref 0.0–100.0)

## 2019-11-12 LAB — MRSA PCR SCREENING: MRSA by PCR: NEGATIVE

## 2019-11-12 SURGERY — CORONARY/GRAFT ACUTE MI REVASCULARIZATION
Anesthesia: LOCAL

## 2019-11-12 MED ORDER — HEPARIN SODIUM (PORCINE) 1000 UNIT/ML IJ SOLN
INTRAMUSCULAR | Status: DC | PRN
  Administered 2019-11-12: 2000 [IU] via INTRAVENOUS

## 2019-11-12 MED ORDER — BASAGLAR KWIKPEN 100 UNIT/ML ~~LOC~~ SOPN: 100.0000 [IU] | PEN_INJECTOR | Freq: Every day | SUBCUTANEOUS | Status: DC

## 2019-11-12 MED ORDER — SODIUM CHLORIDE 0.9 % IV SOLN: 250.0000 mL | INTRAVENOUS | Status: DC | PRN

## 2019-11-12 MED ORDER — SODIUM CHLORIDE 0.9 % WEIGHT BASED INFUSION
1.0000 mL/kg/h | INTRAVENOUS | Status: AC
  Administered 2019-11-12: 1 mL/kg/h via INTRAVENOUS

## 2019-11-12 MED ORDER — PROBENECID 500 MG PO TABS
250.0000 mg | ORAL_TABLET | ORAL | Status: DC
  Administered 2019-11-13: 250 mg via ORAL
  Filled 2019-11-12: qty 1

## 2019-11-12 MED ORDER — TAMSULOSIN HCL 0.4 MG PO CAPS
0.4000 mg | ORAL_CAPSULE | Freq: Every day | ORAL | Status: DC
  Administered 2019-11-12 – 2019-11-14 (×3): 0.4 mg via ORAL
  Filled 2019-11-12 (×3): qty 1

## 2019-11-12 MED ORDER — INSULIN ASPART 100 UNIT/ML ~~LOC~~ SOLN: 20.0000 [IU] | Freq: Two times a day (BID) | SUBCUTANEOUS | Status: DC

## 2019-11-12 MED ORDER — LOSARTAN POTASSIUM 25 MG PO TABS
25.0000 mg | ORAL_TABLET | Freq: Every day | ORAL | Status: DC
  Administered 2019-11-13 – 2019-11-14 (×2): 25 mg via ORAL
  Filled 2019-11-12 (×2): qty 1

## 2019-11-12 MED ORDER — HYDRALAZINE HCL 20 MG/ML IJ SOLN: 10.0000 mg | INTRAMUSCULAR | Status: AC | PRN

## 2019-11-12 MED ORDER — ADULT MULTIVITAMIN W/MINERALS CH
1.0000 | ORAL_TABLET | Freq: Every day | ORAL | Status: DC
  Administered 2019-11-12 – 2019-11-14 (×3): 1 via ORAL
  Filled 2019-11-12 (×3): qty 1

## 2019-11-12 MED ORDER — ATROPINE SULFATE 1 MG/10ML IJ SOSY
PREFILLED_SYRINGE | INTRAMUSCULAR | Status: DC | PRN
  Administered 2019-11-12: 1 mg via INTRAVENOUS

## 2019-11-12 MED ORDER — INSULIN ASPART 100 UNIT/ML FLEXPEN: 35.0000 [IU] | PEN_INJECTOR | Freq: Three times a day (TID) | SUBCUTANEOUS | Status: DC

## 2019-11-12 MED ORDER — HEPARIN SODIUM (PORCINE) 1000 UNIT/ML IJ SOLN
INTRAMUSCULAR | Status: AC
  Filled 2019-11-12: qty 1

## 2019-11-12 MED ORDER — VERAPAMIL HCL 2.5 MG/ML IV SOLN
INTRAVENOUS | Status: AC
  Filled 2019-11-12: qty 2

## 2019-11-12 MED ORDER — IOHEXOL 350 MG/ML SOLN
INTRAVENOUS | Status: AC
  Filled 2019-11-12: qty 1

## 2019-11-12 MED ORDER — ASPIRIN 81 MG PO CHEW
81.0000 mg | CHEWABLE_TABLET | Freq: Every day | ORAL | Status: DC
  Administered 2019-11-13 – 2019-11-14 (×2): 81 mg via ORAL
  Filled 2019-11-12 (×2): qty 1

## 2019-11-12 MED ORDER — LABETALOL HCL 5 MG/ML IV SOLN: 10.0000 mg | INTRAVENOUS | Status: AC | PRN

## 2019-11-12 MED ORDER — SODIUM CHLORIDE 0.9% FLUSH
3.0000 mL | Freq: Once | INTRAVENOUS | Status: AC
  Administered 2019-11-12: 3 mL via INTRAVENOUS

## 2019-11-12 MED ORDER — INSULIN GLARGINE 100 UNIT/ML ~~LOC~~ SOLN
75.0000 [IU] | Freq: Every day | SUBCUTANEOUS | Status: DC
  Administered 2019-11-12 – 2019-11-13 (×2): 75 [IU] via SUBCUTANEOUS
  Filled 2019-11-12 (×5): qty 0.75

## 2019-11-12 MED ORDER — LIDOCAINE HCL (PF) 1 % IJ SOLN
INTRAMUSCULAR | Status: AC
  Filled 2019-11-12: qty 30

## 2019-11-12 MED ORDER — CLOPIDOGREL BISULFATE 300 MG PO TABS
600.0000 mg | ORAL_TABLET | Freq: Once | ORAL | Status: AC
  Administered 2019-11-12: 600 mg via ORAL

## 2019-11-12 MED ORDER — ONDANSETRON HCL 4 MG/2ML IJ SOLN
4.0000 mg | Freq: Four times a day (QID) | INTRAMUSCULAR | Status: DC | PRN
  Administered 2019-11-12: 4 mg via INTRAVENOUS
  Filled 2019-11-12: qty 2

## 2019-11-12 MED ORDER — FINASTERIDE 5 MG PO TABS
5.0000 mg | ORAL_TABLET | Freq: Every day | ORAL | Status: DC
  Administered 2019-11-12 – 2019-11-14 (×3): 5 mg via ORAL
  Filled 2019-11-12 (×3): qty 1

## 2019-11-12 MED ORDER — ASCORBIC ACID 500 MG PO TABS
500.0000 mg | ORAL_TABLET | Freq: Every evening | ORAL | Status: DC
  Administered 2019-11-12 – 2019-11-13 (×2): 500 mg via ORAL
  Filled 2019-11-12 (×2): qty 1

## 2019-11-12 MED ORDER — VERAPAMIL HCL 2.5 MG/ML IV SOLN
INTRAVENOUS | Status: DC | PRN
  Administered 2019-11-12: 10 mL via INTRA_ARTERIAL

## 2019-11-12 MED ORDER — INSULIN ASPART 100 UNIT/ML ~~LOC~~ SOLN
0.0000 [IU] | Freq: Three times a day (TID) | SUBCUTANEOUS | Status: DC
  Administered 2019-11-13 – 2019-11-14 (×3): 2 [IU] via SUBCUTANEOUS

## 2019-11-12 MED ORDER — HEPARIN (PORCINE) IN NACL 1000-0.9 UT/500ML-% IV SOLN
INTRAVENOUS | Status: AC
  Filled 2019-11-12: qty 500

## 2019-11-12 MED ORDER — ACETAMINOPHEN 325 MG PO TABS: 650.0000 mg | ORAL_TABLET | ORAL | Status: DC | PRN

## 2019-11-12 MED ORDER — FENTANYL CITRATE (PF) 100 MCG/2ML IJ SOLN
INTRAMUSCULAR | Status: AC
  Filled 2019-11-12: qty 2

## 2019-11-12 MED ORDER — HEPARIN SODIUM (PORCINE) 5000 UNIT/ML IJ SOLN
INTRAMUSCULAR | Status: AC
  Administered 2019-11-12: 4000 [IU] via INTRAVENOUS
  Filled 2019-11-12: qty 1

## 2019-11-12 MED ORDER — SODIUM CHLORIDE 0.9 % IV BOLUS
500.0000 mL | Freq: Once | INTRAVENOUS | Status: AC
  Administered 2019-11-12: 500 mL via INTRAVENOUS

## 2019-11-12 MED ORDER — INSULIN ASPART 100 UNIT/ML ~~LOC~~ SOLN: 0.0000 [IU] | Freq: Every day | SUBCUTANEOUS | Status: DC

## 2019-11-12 MED ORDER — SODIUM CHLORIDE 0.9% FLUSH: 3.0000 mL | INTRAVENOUS | Status: DC | PRN

## 2019-11-12 MED ORDER — FENTANYL CITRATE (PF) 100 MCG/2ML IJ SOLN
INTRAMUSCULAR | Status: DC | PRN
  Administered 2019-11-12: 25 ug via INTRAVENOUS

## 2019-11-12 MED ORDER — ASPIRIN 81 MG PO CHEW
324.0000 mg | CHEWABLE_TABLET | Freq: Once | ORAL | Status: AC
  Administered 2019-11-12: 324 mg via ORAL

## 2019-11-12 MED ORDER — HEPARIN SODIUM (PORCINE) 5000 UNIT/ML IJ SOLN
5000.0000 [IU] | Freq: Three times a day (TID) | INTRAMUSCULAR | Status: AC
  Administered 2019-11-12 – 2019-11-13 (×4): 5000 [IU] via SUBCUTANEOUS
  Filled 2019-11-12 (×4): qty 1

## 2019-11-12 MED ORDER — ROSUVASTATIN CALCIUM 20 MG PO TABS
40.0000 mg | ORAL_TABLET | Freq: Every day | ORAL | Status: DC
  Administered 2019-11-12 – 2019-11-13 (×2): 40 mg via ORAL
  Filled 2019-11-12 (×2): qty 2

## 2019-11-12 MED ORDER — SODIUM CHLORIDE 0.9 % IV SOLN: INTRAVENOUS | Status: DC

## 2019-11-12 MED ORDER — INSULIN PEN NEEDLE 31G X 8 MM MISC: 1.0000 | Freq: Four times a day (QID) | Status: DC

## 2019-11-12 MED ORDER — CHLORHEXIDINE GLUCONATE CLOTH 2 % EX PADS
6.0000 | MEDICATED_PAD | Freq: Every day | CUTANEOUS | Status: DC
  Administered 2019-11-12: 6 via TOPICAL

## 2019-11-12 MED ORDER — HEPARIN SODIUM (PORCINE) 1000 UNIT/ML IJ SOLN
INTRAMUSCULAR | Status: DC | PRN
  Administered 2019-11-12: 10000 [IU] via INTRAVENOUS

## 2019-11-12 MED ORDER — SODIUM CHLORIDE 0.9% FLUSH
3.0000 mL | Freq: Two times a day (BID) | INTRAVENOUS | Status: DC
  Administered 2019-11-12 – 2019-11-13 (×2): 3 mL via INTRAVENOUS
  Administered 2019-11-13: 10 mL via INTRAVENOUS
  Administered 2019-11-14: 3 mL via INTRAVENOUS

## 2019-11-12 MED ORDER — HEPARIN (PORCINE) IN NACL 1000-0.9 UT/500ML-% IV SOLN
INTRAVENOUS | Status: DC | PRN
  Administered 2019-11-12 (×2): 500 mL

## 2019-11-12 MED ORDER — CLOPIDOGREL BISULFATE 75 MG PO TABS
75.0000 mg | ORAL_TABLET | Freq: Every day | ORAL | Status: DC
  Administered 2019-11-13 – 2019-11-14 (×2): 75 mg via ORAL
  Filled 2019-11-12 (×2): qty 1

## 2019-11-12 MED ORDER — IOHEXOL 350 MG/ML SOLN
INTRAVENOUS | Status: DC | PRN
  Administered 2019-11-12: 125 mL

## 2019-11-12 MED ORDER — ATROPINE SULFATE 1 MG/10ML IJ SOSY
PREFILLED_SYRINGE | INTRAMUSCULAR | Status: AC
  Filled 2019-11-12: qty 10

## 2019-11-12 MED ORDER — LIDOCAINE HCL (PF) 1 % IJ SOLN
INTRAMUSCULAR | Status: DC | PRN
  Administered 2019-11-12: 2 mL

## 2019-11-12 MED ORDER — OXYCODONE HCL 5 MG PO TABS: 5.0000 mg | ORAL_TABLET | ORAL | Status: DC | PRN

## 2019-11-12 MED ORDER — METOPROLOL TARTRATE 12.5 MG HALF TABLET
12.5000 mg | ORAL_TABLET | Freq: Two times a day (BID) | ORAL | Status: DC
  Administered 2019-11-12 – 2019-11-14 (×4): 12.5 mg via ORAL
  Filled 2019-11-12 (×4): qty 1

## 2019-11-12 MED ORDER — HEPARIN SODIUM (PORCINE) 5000 UNIT/ML IJ SOLN: 4000.0000 [IU] | Freq: Once | INTRAMUSCULAR | Status: AC

## 2019-11-12 MED ORDER — ASPIRIN 81 MG PO CHEW
CHEWABLE_TABLET | ORAL | Status: AC
  Filled 2019-11-12: qty 4

## 2019-11-12 MED ORDER — VITAMIN D 25 MCG (1000 UNIT) PO TABS
2000.0000 [IU] | ORAL_TABLET | Freq: Every day | ORAL | Status: DC
  Administered 2019-11-12 – 2019-11-14 (×3): 2000 [IU] via ORAL
  Filled 2019-11-12 (×3): qty 2

## 2019-11-12 SURGICAL SUPPLY — 19 items
BALLN SAPPHIRE 2.5X12 (BALLOONS) ×2
BALLN SAPPHIRE ~~LOC~~ 3.25X15 (BALLOONS) ×1 IMPLANT
BALLN SAPPHIRE ~~LOC~~ 3.5X15 (BALLOONS) ×1 IMPLANT
BALLOON SAPPHIRE 2.5X12 (BALLOONS) IMPLANT
CATH EXTRAC PRONTO 5.5F 138CM (CATHETERS) ×1 IMPLANT
CATH INFINITI 5 FR JL3.5 (CATHETERS) ×1 IMPLANT
CATH VISTA GUIDE 6FR JR4 (CATHETERS) ×1 IMPLANT
DEVICE RAD COMP TR BAND LRG (VASCULAR PRODUCTS) ×1 IMPLANT
GLIDESHEATH SLEND A-KIT 6F 22G (SHEATH) ×1 IMPLANT
GLIDESHEATH SLEND SS 6F .021 (SHEATH) IMPLANT
GUIDEWIRE INQWIRE 1.5J.035X260 (WIRE) IMPLANT
INQWIRE 1.5J .035X260CM (WIRE) ×2
KIT ENCORE 26 ADVANTAGE (KITS) ×1 IMPLANT
KIT HEART LEFT (KITS) ×2 IMPLANT
PACK CARDIAC CATHETERIZATION (CUSTOM PROCEDURE TRAY) ×2 IMPLANT
STENT RESOLUTE ONYX 3.0X22 (Permanent Stent) ×1 IMPLANT
TRANSDUCER W/STOPCOCK (MISCELLANEOUS) ×2 IMPLANT
TUBING CIL FLEX 10 FLL-RA (TUBING) ×2 IMPLANT
WIRE ASAHI PROWATER 180CM (WIRE) ×1 IMPLANT

## 2019-11-12 NOTE — ED Triage Notes (Signed)
C/o L sided chest pain, SOB, intermittent L arm numbness, and nausea since 2 am.

## 2019-11-12 NOTE — ED Notes (Signed)
EKG shows STEMI.  EDP notified and pt straight to trauma room.

## 2019-11-12 NOTE — H&P (Signed)
Cardiology Admission History and Physical:   Patient ID: Alan Morrison MRN: [id number]; DOB: Jan 01, 1951   Admission date: 11/12/2019  Primary Care Provider: [name], MD [facility] Cardiologist:  Peter Martinique [place] Electrophysiologist:  None   Chief Complaint:  Acute inferior wall STEMI  Patient Profile:   Alan Morrison is a 69 y.o. male with type 2 diabetes mellitus with vascular complications, stress cardiomyopathy (2000), obstructive sleep apnea treated with CPAP, hyperlipidemia, ex-smoker, and known history of nonobstructive CAD by angiography 2018.  Presented with an 8-10-hour duration chest pressure with inferior ST elevation and inferior Q waves noted.  Chest pain is ongoing, graded of 6/10 and has been unremitting over the timeframe noted.  History of Present Illness:   Alan Morrison was awakened at around 2 AM with pressure in his chest.  He did not use nitroglycerin or call for help.  The discomfort persisted through the night and eventually around 11:00 AM he had his wife bring him to the hospital for evaluation where an EKG at 11:30 AM led to activation of STEMI..  Initial EKG tracing identified inferior Q waves with persistent ST elevation compatible with inferior acute ischemia.  Mild dyspnea was present.  No prior history of such discomfort.  Previous cardiac evaluation was for other symptoms, never of this variety.  He has multiple risk factors as noted above.   Past Medical History:  Diagnosis Date  . Anemia, iron deficiency, inadequate dietary intake   . 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   by [place]  . CAD (coronary artery disease) 2003   cath - Min Dz, EF 65%, Adm.R/O'd  . Cervical spondylosis without myelopathy   . Chest pain, atypical   . Choroidal nevus 01/22/2011   left, yearly eye exam, no diabetic retinopathy  . Diabetes mellitus type II   . Dyspepsia   . Ex-smoker   . Fatty liver 02/2016   by [place]  . GERD (gastroesophageal reflux disease)    . Headache(784.0)   . Heart murmur    Hx of  . History of MRI of brain and brain stem 09/06  . History of MRSA infection 12/2013   R knee boil  . HLD (hyperlipidemia)   . Hyperuricemia   . Obesity   . Sleep apnea   . Stress-induced cardiomyopathy 09/27/98   WNL, EF 64%  . Urosepsis 01/01-01/05/10   Hospitalization    Past Surgical History:  Procedure Laterality Date  . COLONOSCOPY  07/2013   1 benign polyp rpt 10 yrs [name])  . KNEE ARTHROSCOPY Left 1988  . KNEE SURGERY Right 05/21/03   Right, Dr. Marlou Sa, med meniscus tear via MRI  . LEFT HEART CATH AND CORONARY ANGIOGRAPHY N/A 04/01/2017   Procedure: LEFT HEART CATH AND CORONARY ANGIOGRAPHY;  Surgeon: Martinique, Peter M, MD;  Location: [place] [facility];  Service: Cardiovascular;  Laterality: N/A;  . MYELOGRAM  08/05   bulging disc     Medications Prior to Admission: Prior to Admission medications   Medication Sig Start Date End Date Taking? Authorizing Provider  Cholecalciferol (VITAMIN D) 2000 units tablet Take 2,000 Units by mouth daily.    [provider]  finasteride (PROSCAR) 5 MG tablet Take 1 tablet (5 mg total) by mouth daily. 05/31/19   [name], MD  insulin aspart (NOVOLOG FLEXPEN) 100 UNIT/ML FlexPen Inject 35 Units into the skin 3 (three) times daily with meals. And pen needles 4/day 06/19/19   Renato Shin, MD  Insulin Glargine [facility]) 100 UNIT/ML  SOPN Inject 1 mL (100 Units total) into the skin daily. 06/19/19   Renato Shin, MD  Insulin Pen Needle (B-D ULTRAFINE III SHORT PEN) 31G X 8 MM MISC 1 Device by Other route 4 (four) times daily. USE AS DIRECTED DAILY 06/09/18   Renato Shin, MD  losartan (COZAAR) 25 MG tablet Take 1 tablet (25 mg total) by mouth daily. 05/31/19   [name], MD  metoprolol tartrate (LOPRESSOR) 25 MG tablet TAKE 0.5 TABLETS (12.5 MG TOTAL) BY MOUTH 2 (TWO) TIMES DAILY. 08/18/19 11/16/19  Martinique, Peter M, MD  Multiple Vitamin (MULTIVITAMIN WITH MINERALS) TABS  tablet Take 1 tablet by mouth daily.    [provider]  [facility] ULTRA test strip USE TO CHECK SUGARS DAILY  AS DIRECTED 03/13/19   [name], MD  probenecid (BENEMID) 500 MG tablet Take 0.5 tablets (250 mg total) by mouth every other day. 05/31/19   [name], MD  rosuvastatin (CRESTOR) 40 MG tablet TAKE 1 TABLET DAILY. PLEASE SCHEDULE ANNUAL APPT WITH DR. Martinique FOR REFILLS. [phone] 07/06/19   Martinique, Peter M, MD  tamsulosin (FLOMAX) 0.4 MG CAPS capsule Take 1 capsule (0.4 mg total) by mouth daily. 05/31/19   [name], MD  vitamin C (ASCORBIC ACID) 500 MG tablet Take 500 mg by mouth every evening.     [provider]     Allergies:    Allergies  Allergen Reactions  . Victoza [Liraglutide] Nausea Only    Irritation around injection site    Social History:   Social History   Socioeconomic History  . Marital status: Married    Spouse name: Not on file  . Number of children: 2  . Years of education: Not on file  . Highest education level: Not on file  Occupational History  . Occupation: Maintenance    Employer: [employer]  Tobacco Use  . Smoking status: Former Smoker    Quit date: 04/20/1974    Years since quitting: 45.5  . Smokeless tobacco: Never Used  . Tobacco comment: quit over 20 years  Vaping Use  . Vaping Use: Never used  Substance and Sexual Activity  . Alcohol use: Yes    Alcohol/week: 3.0 standard drinks    Types: 3 Cans of beer per week    Comment: occasional  . Drug use: No  . Sexual activity: Not on file  Other Topics Concern  . Not on file  Social History Narrative   Lives with wife   Occupation: equipment maintenance   Activity: no regular exercise - hunts   Diet: good water, fruits/vegetables   Social Determinants of Health   Financial Resource Strain:   . Difficulty of Paying Living Expenses:   Food Insecurity:   . Worried About Charity fundraiser in the Last Year:   . Arboriculturist in the  Last Year:   Transportation Needs:   . Film/video editor (Medical):   [name] Lack of Transportation (Non-Medical):   Physical Activity:   . Days of Exercise per Week:   . Minutes of Exercise per Session:   Stress:   . Feeling of Stress :   Social Connections:   . Frequency of Communication with Friends and Family:   . Frequency of Social Gatherings with Friends and Family:   . Attends Religious Services:   . Active Member of Clubs or Organizations:   . Attends Archivist Meetings:   [name] Marital Status:   Intimate Partner Violence:   .  Fear of Current or Ex-Partner:   . Emotionally Abused:   [name] Physically Abused:   . Sexually Abused:     Family History: Parents had CAD. The patient's family history includes CAD (age of onset: 33) in his brother; Cancer in his maternal uncle; Diabetes in his mother; Heart disease in his father and mother; Hypertension in his father; Migraines in his brother. There is no history of Stroke or Colon cancer.    ROS:  Please see the history of present illness.  Not very physically active.  Is diabetic.  He is compliant with his medications.  Is compliant with CPAP.  He no longer smokes cigarettes.  He denies claudication.  All other ROS reviewed and negative.     Physical Exam/Data:   Vitals:   11/12/19 1259 11/12/19 1304 11/12/19 1309 11/12/19 1314  BP: 113/74 111/74 109/75 109/71  Pulse: (!) 118 (!) 201 (!) 163 (!) 167  Resp: (!) 27 15 (!) 7 (!) 10  Temp:      TempSrc:      SpO2: 99% 99% 100% 100%  Weight:      Height:       No intake or output data in the 24 hours ending 11/12/19 1355 Last 3 Weights 11/12/2019 07/13/2019 07/07/2019  Weight (lbs) 210 lb 217 lb 5 oz 221 lb 6 oz  Weight (kg) 95.255 kg 98.572 kg 100.415 kg     Body mass index is 31.93 kg/m.  General: Moderate to severe obesity.  Ongoing pain without pallor, clamminess or acute distress. HEENT: normal Lymph: no adenopathy Neck: Unable to evaluate JVD due to "ball  neck" Endocrine:  No thryomegaly Vascular: No carotid bruits; FA pulses 2+ bilaterally without bruits.  See below under extremities. Cardiac:  normal S1, S2; RRR; no murmur.  Heart sounds are distant Lungs:  clear to auscultation bilaterally, no wheezing, rhonchi or rales  Abd: soft, nontender, no hepatomegaly  Ext: Radial pulses are 2+.  Posterior tibial pulses are 2+.  Edema Musculoskeletal:  No deformities, BUE and BLE strength normal and equal Skin: warm and dry  Neuro:  CNs 2-12 intact, no focal abnormalities noted Psych:  Normal affect    EKG:  The ECG that was done at 11:42 AM was personally reviewed and demonstrates 2 mm of ST elevation II, III, aVF, and V4 through V6.  aVL and lead I with reciprocal changes noted.  There are Q waves in 3 and aVF.  Relevant CV Studies: Previous cardiac catheterization from 2018 was personally reviewed revealing a dominant RCA with luminal irregularities and also luminal irregularities in the LAD.  Laboratory Data:  High Sensitivity Troponin:   Recent Labs  Lab 11/12/19 1143  TROPONINIHS 9,809*      Chemistry Recent Labs  Lab 11/12/19 1143  NA 141  K 4.6  CL 109  CO2 19*  GLUCOSE 120*  BUN 35*  CREATININE 1.63*  CALCIUM 9.5  GFRNONAA 43*  GFRAA 49*  ANIONGAP 13    Recent Labs  Lab 11/12/19 1143  PROT 7.0  ALBUMIN 3.8  AST 126*  ALT 60*  ALKPHOS 43  BILITOT 1.1   Hematology Recent Labs  Lab 11/12/19 1143  WBC 11.8*  RBC 5.05  HGB 15.1  HCT 45.4  MCV 89.9  MCH 29.9  MCHC 33.3  RDW 13.3  PLT 210   BNPNo results for input(s): BNP, PROBNP in the last 168 hours.  DDimer No results for input(s): DDIMER in the last 168 hours.   Radiology/Studies:  DG Chest Port 1 View  Result Date: 11/12/2019 CLINICAL DATA:  STEMI, history coronary artery disease, type II diabetes mellitus, former smoker, GERD, hypertension EXAM: PORTABLE CHEST 1 VIEW COMPARISON:  Portable exam 1158 hours without priors for comparison. FINDINGS:  Normal heart size, mediastinal contours, and pulmonary vascularity. Lungs clear. No pulmonary infiltrate, pleural effusion or pneumothorax. Scattered endplate spur formation thoracic spine. No acute osseous findings. IMPRESSION: No acute abnormalities. Electronically Signed   By: Lavonia Dana M.D.   On: 11/12/2019 12:19       TIMI Risk Score for ST  Elevation MI:   The patient's TIMI risk score is 6, which indicates a 16.1% risk of all cause mortality at 30 days.       Assessment and Plan:   1. Late presenting acute inferior wall ST segment elevation, with ongoing chest pain for 9 to 10 hours before arrival in the emergency department.  Ongoing intense chest discomfort with ST elevation was still noted despite the presence of Q waves on EKG. 2. Coronary artery disease with previous documentation of nonobstructive CAD in the RCA LAD and circumflex.  Will need aggressive risk factor modification. 3. Type 2 diabetes mellitus with known vascular complications.  Target A1c less than 7.  Will be checked this admission. 4. Obstructive sleep apnea compliant with CPAP 5. Essential hypertension with target 130/80 mmHg or less 6. Hyperlipidemia with LDL target less than 70 7. Acute on chronic kidney disease stage III, possibly with a low output/volume depletion component  Greater than 30 minutes was spent with the patient in the emergency department attending to his acute myocardial infarction.  Heparin was administered.  IV normal saline fluid resuscitation was given because of soft blood pressures running around 824 mmHg systolic.  A [place] [address] [place] [address] delayed arrival of team members.  The patient was loaded with Plavix 600 mg while in the emergency room.  Critical care time 35 minutes.    Severity of Illness: The appropriate patient status for this patient is OBSERVATION. Observation status is judged to be reasonable and necessary in order to provide the required  intensity of service to ensure the patient's safety. The patient's presenting symptoms, physical exam findings, and initial radiographic and laboratory data in the context of their medical condition is felt to place them at decreased risk for further clinical deterioration. Furthermore, it is anticipated that the patient will be medically stable for discharge from the hospital within 2 midnights of admission. The following factors support the patient status of observation.   " The patient's presenting symptoms include ongoing chest pain in setting of acute MI.. " The physical exam findings include none. " The initial radiographic and laboratory data are elevation on EKG, elevated troponin I, elevated creatinine.     For questions or updates, please contact [place]y Please consult [email or web address] for contact info under     Signed, Sinclair Grooms, MD  11/12/2019 1:55 PM

## 2019-11-12 NOTE — ED Provider Notes (Signed)
[facility] EMERGENCY DEPARTMENT Provider Note   CSN: [id number] Arrival date & time: 11/12/19  1117  LEVEL 5 CAVEAT - ACUITY OF CONDITION History Chief Complaint  Patient presents with  . Chest Pain    Alan Morrison is a 69 y.o. male.  HPI 69 year old male presents with chest pressure.  Woke him up at 2 AM.  No radiation of the pain.  He has some shortness of breath.  No back pain.  No diaphoresis.  Pressure is rated as about a 5 out of 10.  Denies prior history of CAD.  Has hypertension, hyperlipidemia, diabetes.  Is also on Xarelto for DVT.   Past Medical History:  Diagnosis Date  . Anemia, iron deficiency, inadequate dietary intake   . 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   by [place]  . CAD (coronary artery disease) 2003   cath - Min Dz, EF 65%, Adm.R/O'd  . Cervical spondylosis without myelopathy   . Chest pain, atypical   . Choroidal nevus 01/22/2011   left, yearly eye exam, no diabetic retinopathy  . Diabetes mellitus type II   . Dyspepsia   . Ex-smoker   . Fatty liver 02/2016   by [place]  . GERD (gastroesophageal reflux disease)   . Headache(784.0)   . Heart murmur    Hx of  . History of MRI of brain and brain stem 09/06  . History of MRSA infection 12/2013   R knee boil  . HLD (hyperlipidemia)   . Hyperuricemia   . Obesity   . Sleep apnea   . Stress-induced cardiomyopathy 09/27/98   WNL, EF 64%  . Urosepsis 01/01-01/05/10   Hospitalization    Patient Active Problem List   Diagnosis Date Noted  . Cellulitis and abscess of buttock 07/07/2019  . Welcome to Medicare preventive visit 05/31/2019  . Advanced directives, counseling/discussion 02/02/2019  . DVT of deep femoral vein, left ([place]) 04/29/2018  . GERD (gastroesophageal reflux disease)   . Cervical spondylosis without myelopathy   . Anemia, iron deficiency, inadequate dietary intake   . Angina pectoris ([place]) 04/01/2017  . Right hip pain 06/09/2016  . Angiomyolipoma of left kidney  02/19/2016  . Fatty liver 02/19/2016  . Fatigue 06/06/2015  . Hyperuricemia   . Ex-smoker   . History of MRSA infection 12/19/2013  . BPV (benign positional vertigo) 12/13/2013  . Hyperlipidemia associated with type 2 diabetes mellitus ([place])   . Vitamin D deficiency 05/01/2012  . Obesity, Class I, BMI 30-34.9 06/08/2011  . Health maintenance examination 05/06/2011  . Choroidal nevus 01/22/2011  . Essential hypertension, benign 06/12/2010  . PALPITATIONS, OCCASIONAL 04/30/2010  . TESTICULAR HYPOFUNCTION 03/11/2009  . Benign prostatic hyperplasia with urinary obstruction 02/04/2009  . Sleep apnea 02/04/2009  . HEART MURMUR, HX OF 12/15/2008  . ARTHROSCOPY, KNEE, HX OF 12/15/2008  . Type 2 diabetes, uncontrolled, with neuropathy ([place]) 10/18/2006  . CAD (coronary artery disease), native coronary artery 12/12/2001  . CAD (coronary artery disease) 04/20/2001  . Stress-induced cardiomyopathy 09/27/1998    Past Surgical History:  Procedure Laterality Date  . COLONOSCOPY  07/2013   1 benign polyp rpt 10 yrs [name])  . KNEE ARTHROSCOPY Left 1988  . KNEE SURGERY Right 05/21/03   Right, Dr. Marlou Sa, med meniscus tear via MRI  . LEFT HEART CATH AND CORONARY ANGIOGRAPHY N/A 04/01/2017   Procedure: LEFT HEART CATH AND CORONARY ANGIOGRAPHY;  Surgeon: Martinique, Peter M, MD;  Location: [facility];  Service: Cardiovascular;  Laterality: N/A;  .  MYELOGRAM  08/05   bulging disc       Family History  Problem Relation Age of Onset  . Heart disease Mother        CHF  . Diabetes Mother   . Heart disease Father        CHF  . Hypertension Father   . Migraines Brother        severe headaches from arsenic in the past from  wood that was treated on his deck  . CAD Brother 23       stent  . Cancer Maternal Uncle        unsure  . Stroke Neg Hx   . Colon cancer Neg Hx     Social History   Tobacco Use  . Smoking status: Former Smoker    Quit date: 04/20/1974    Years since quitting:  45.5  . Smokeless tobacco: Never Used  . Tobacco comment: quit over 20 years  Vaping Use  . Vaping Use: Never used  Substance Use Topics  . Alcohol use: Yes    Alcohol/week: 3.0 standard drinks    Types: 3 Cans of beer per week    Comment: occasional  . Drug use: No    Home Medications Prior to Admission medications   Medication Sig Start Date End Date Taking? Authorizing Provider  amoxicillin-clavulanate (AUGMENTIN) 875-125 MG tablet Take 1 tablet by mouth 2 (two) times daily. 07/13/19   [name], MD  ASPIRIN 81 PO Take 1 tablet by mouth daily.    [provider]  Cholecalciferol (VITAMIN D) 2000 units tablet Take 2,000 Units by mouth daily.    [provider]  doxycycline (VIBRA-TABS) 100 MG tablet Take 1 tablet (100 mg total) by mouth 2 (two) times daily. 07/13/19   [name], MD  finasteride (PROSCAR) 5 MG tablet Take 1 tablet (5 mg total) by mouth daily. 05/31/19   [name], MD  insulin aspart (NOVOLOG FLEXPEN) 100 UNIT/ML FlexPen Inject 35 Units into the skin 3 (three) times daily with meals. And pen needles 4/day 06/19/19   Renato Shin, MD  Insulin Glargine (BASAGLAR KWIKPEN) 100 UNIT/ML SOPN Inject 1 mL (100 Units total) into the skin daily. 06/19/19   Renato Shin, MD  Insulin Pen Needle (B-D ULTRAFINE III SHORT PEN) 31G X 8 MM MISC 1 Device by Other route 4 (four) times daily. USE AS DIRECTED DAILY 06/09/18   Renato Shin, MD  losartan (COZAAR) 25 MG tablet Take 1 tablet (25 mg total) by mouth daily. 05/31/19   [name], MD  metFORMIN (GLUCOPHAGE) 1000 MG tablet Take 1,000 mg by mouth daily with breakfast.    [provider]  metoprolol tartrate (LOPRESSOR) 25 MG tablet TAKE 0.5 TABLETS (12.5 MG TOTAL) BY MOUTH 2 (TWO) TIMES DAILY. 08/18/19 11/16/19  Martinique, Peter M, MD  Multiple Vitamin (MULTIVITAMIN WITH MINERALS) TABS tablet Take 1 tablet by mouth daily.    [provider]  omeprazole (PRILOSEC) 40 MG capsule  Take 1 capsule (40 mg total) by mouth daily. Daily for 3 weeks then as needed 05/31/19   [name], MD  [facility] ULTRA test strip USE TO CHECK SUGARS DAILY  AS DIRECTED 03/13/19   [name], MD  probenecid (BENEMID) 500 MG tablet Take 0.5 tablets (250 mg total) by mouth every other day. 05/31/19   [name], MD  rivaroxaban (XARELTO) 10 MG TABS tablet Take 1 tablet (10 mg total) by mouth daily. 11/18/18   Magrinat, Virgie Dad, MD  rosuvastatin (CRESTOR) 40 MG tablet TAKE 1 TABLET DAILY. PLEASE SCHEDULE ANNUAL APPT WITH DR. Martinique FOR REFILLS. [phone] 07/06/19   Martinique, Peter M, MD  tamsulosin (FLOMAX) 0.4 MG CAPS capsule Take 1 capsule (0.4 mg total) by mouth daily. 05/31/19   [name], MD  vitamin C (ASCORBIC ACID) 500 MG tablet Take 500 mg by mouth every evening.     [provider]    Allergies    Victoza [liraglutide]  Review of Systems   Review of Systems  Unable to perform ROS: Acuity of condition    Physical Exam Updated Vital Signs BP 122/70 (BP Location: Right Arm)   Pulse 77   Temp 99.7 F (37.6 C) (Oral)   Resp 16   Ht 5\' 8"  (1.727 m)   Wt (!) 95.3 kg   SpO2 98%   BMI 31.93 kg/m   Physical Exam Vitals and nursing note reviewed.  Constitutional:      General: He is not in acute distress.    Appearance: He is well-developed. He is obese. He is not ill-appearing or diaphoretic.  HENT:     Head: Normocephalic and atraumatic.     Right Ear: External ear normal.     Left Ear: External ear normal.     Nose: Nose normal.  Eyes:     General:        Right eye: No discharge.        Left eye: No discharge.  Cardiovascular:     Rate and Rhythm: Normal rate and regular rhythm.     Pulses:          Radial pulses are 2+ on the right side.     Heart sounds: Normal heart sounds.  Pulmonary:     Effort: Pulmonary effort is normal.     Breath sounds: Normal breath sounds.  Abdominal:     Palpations: Abdomen is soft.     Tenderness:  There is no abdominal tenderness.  Musculoskeletal:     Cervical back: Neck supple.  Skin:    General: Skin is warm and dry.  Neurological:     Mental Status: He is alert.  Psychiatric:        Mood and Affect: Mood is not anxious.     ED Results / Procedures / Treatments   Labs (all labs ordered are listed, but only abnormal results are displayed) Labs Reviewed  HEMOGLOBIN A1C - Abnormal; Notable for the following components:      Result Value   Hgb A1c MFr Bld 6.3 (*)    All other components within normal limits  CBC - Abnormal; Notable for the following components:   WBC 11.8 (*)    All other components within normal limits  SARS CORONAVIRUS 2 BY RT PCR (HOSPITAL ORDER, [facility])  PROTIME-INR  APTT  COMPREHENSIVE METABOLIC PANEL  LIPID PANEL  TROPONIN I (HIGH SENSITIVITY)    EKG EKG Interpretation  Date/Time:  Sunday November 12 2019 11:30:33 EDT Ventricular Rate:  88 PR Interval:    QRS Duration: 102 QT Interval:  350 QTC Calculation: 423 R Axis:   31 Text Interpretation: ** Critical Test Result: STEMI Undetermined rhythm Inferior infarct , possibly acute Lateral injury pattern ** ** ACUTE MI / STEMI ** ** Consider right ventricular involvement in acute inferior infarct Abnormal ECG Interpretation limited secondary to artifact Confirmed by Sherwood Gambler [phone]) on 11/12/2019 11:34:24 AM   Radiology DG Chest Port 1 View  Result Date: 11/12/2019 CLINICAL  DATA:  STEMI, history coronary artery disease, type II diabetes mellitus, former smoker, GERD, hypertension EXAM: PORTABLE CHEST 1 VIEW COMPARISON:  Portable exam 1158 hours without priors for comparison. FINDINGS: Normal heart size, mediastinal contours, and pulmonary vascularity. Lungs clear. No pulmonary infiltrate, pleural effusion or pneumothorax. Scattered endplate spur formation thoracic spine. No acute osseous findings. IMPRESSION: No acute abnormalities. Electronically Signed   By:  Lavonia Dana M.D.   On: 11/12/2019 12:19    Procedures .Critical Care Performed by: Sherwood Gambler, MD Authorized by: Sherwood Gambler, MD   Critical care provider statement:    Critical care time (minutes):  35   Critical care time was exclusive of:  Separately billable procedures and treating other patients   Critical care was necessary to treat or prevent imminent or life-threatening deterioration of the following conditions:  Circulatory failure and cardiac failure   Critical care was time spent personally by me on the following activities:  Discussions with consultants, evaluation of patient's response to treatment, examination of patient, ordering and performing treatments and interventions, ordering and review of laboratory studies, ordering and review of radiographic studies, pulse oximetry, re-evaluation of patient's condition, obtaining history from patient or surrogate and review of old charts   (including critical care time)  Medications Ordered in ED Medications  sodium chloride flush (NS) 0.9 % injection 3 mL (has no administration in time range)  0.9 %  sodium chloride infusion (has no administration in time range)  aspirin chewable tablet 324 mg (324 mg Oral Given 11/12/19 1144)  heparin injection 4,000 Units (4,000 Units Intravenous Given 11/12/19 1144)    ED Course  I have reviewed the triage vital signs and the nursing notes.  Pertinent labs & imaging results that were available during my care of the patient were reviewed by me and considered in my medical decision making (see chart for details).    MDM Rules/Calculators/A&P                          Patient presents with chest pressure.  Initial ECG consistent with inferior STEMI.  He is hemodynamically stable though he does have some soft blood pressures.  Given inferior aspect, will hold nitroglycerin.  He was given aspirin, heparin, and I discussed with cardiologist, Dr. Tamala Julian and the Cath Lab was activated.   Patient has remained stable throughout his ED course. Final Clinical Impression(s) / ED Diagnoses Final diagnoses:  ST elevation myocardial infarction (STEMI), unspecified artery [facility])    Rx / DC Orders ED Discharge Orders    None       Sherwood Gambler, MD 11/12/19 1243

## 2019-11-12 NOTE — Progress Notes (Signed)
EKG CRITICAL VALUE     12 lead EKG performed.  Critical value noted.  Kathrin Penner, RN notified.   Radene Gunning, CCT 11/12/2019 2:14 PM

## 2019-11-13 ENCOUNTER — Encounter (HOSPITAL_COMMUNITY): Payer: Self-pay | Admitting: Interventional Cardiology

## 2019-11-13 ENCOUNTER — Inpatient Hospital Stay (HOSPITAL_COMMUNITY): Payer: Medicare HMO

## 2019-11-13 DIAGNOSIS — I2119 ST elevation (STEMI) myocardial infarction involving other coronary artery of inferior wall: Secondary | ICD-10-CM

## 2019-11-13 LAB — POCT ACTIVATED CLOTTING TIME
Activated Clotting Time: 257 seconds
Activated Clotting Time: 346 seconds

## 2019-11-13 LAB — BASIC METABOLIC PANEL
Anion gap: 11 (ref 5–15)
BUN: 36 mg/dL — ABNORMAL HIGH (ref 8–23)
CO2: 19 mmol/L — ABNORMAL LOW (ref 22–32)
Calcium: 8.9 mg/dL (ref 8.9–10.3)
Chloride: 110 mmol/L (ref 98–111)
Creatinine, Ser: 1.5 mg/dL — ABNORMAL HIGH (ref 0.61–1.24)
GFR calc Af Amer: 55 mL/min — ABNORMAL LOW (ref >60)
GFR calc non Af Amer: 47 mL/min — ABNORMAL LOW (ref >60)
Glucose, Bld: 104 mg/dL — ABNORMAL HIGH (ref 70–99)
Potassium: 4.6 mmol/L (ref 3.5–5.1)
Sodium: 140 mmol/L (ref 135–145)

## 2019-11-13 LAB — CBC
HCT: 39.5 % (ref 39.0–52.0)
Hemoglobin: 13.4 g/dL (ref 13.0–17.0)
MCH: 30.5 pg (ref 26.0–34.0)
MCHC: 33.9 g/dL (ref 30.0–36.0)
MCV: 90 fL (ref 80.0–100.0)
Platelets: 173 10*3/uL (ref 150–400)
RBC: 4.39 MIL/uL (ref 4.22–5.81)
RDW: 13.6 % (ref 11.5–15.5)
WBC: 9.7 10*3/uL (ref 4.0–10.5)
nRBC: 0 % (ref 0.0–0.2)

## 2019-11-13 LAB — ECHOCARDIOGRAM COMPLETE
Height: 68 in
MV M vel: 4.42 m/s
MV Peak grad: 78.1 mmHg
Radius: 0.3 cm
S' Lateral: 3.39 cm
Weight: 3360 oz

## 2019-11-13 LAB — GLUCOSE, CAPILLARY
Glucose-Capillary: 105 mg/dL — ABNORMAL HIGH (ref 70–99)
Glucose-Capillary: 146 mg/dL — ABNORMAL HIGH (ref 70–99)
Glucose-Capillary: 144 mg/dL — ABNORMAL HIGH (ref 70–99)
Glucose-Capillary: 169 mg/dL — ABNORMAL HIGH (ref 70–99)

## 2019-11-13 LAB — HEMOGLOBIN A1C
Hgb A1c MFr Bld: 6.3 % — ABNORMAL HIGH (ref 4.8–5.6)
Mean Plasma Glucose: 134.11 mg/dL

## 2019-11-13 MED ORDER — RIVAROXABAN 10 MG PO TABS
10.0000 mg | ORAL_TABLET | Freq: Every day | ORAL | Status: DC
  Administered 2019-11-14: 10 mg via ORAL
  Filled 2019-11-13: qty 1

## 2019-11-13 MED ORDER — PERFLUTREN LIPID MICROSPHERE
1.0000 mL | INTRAVENOUS | Status: AC | PRN
  Administered 2019-11-13: 3 mL via INTRAVENOUS
  Filled 2019-11-13: qty 10

## 2019-11-13 MED ORDER — METFORMIN HCL 500 MG PO TABS: 1000.0000 mg | ORAL_TABLET | Freq: Two times a day (BID) | ORAL | Status: DC

## 2019-11-13 MED FILL — Heparin Sod (Porcine)-NaCl IV Soln 1000 Unit/500ML-0.9%: INTRAVENOUS | Qty: 500 | Status: AC

## 2019-11-13 NOTE — Research (Signed)
Spoke with patient about AEGIS study. Answered any questions that he and family may have. Will talk with patient in the am. Left consent and brochure with patient.

## 2019-11-13 NOTE — Progress Notes (Signed)
[id number] MI education completed with pt and wife who voiced understanding. Stressed importance of plavix with stent. Reviewed NTG use, MI restrictions, walking for ex, heart healthy and low carb food choices, and CRP 2. Referred to [place] CRP 2. Pt had just walked with RN 370 ft without CP so did not walk again. Graylon Good RN BSN 11/13/2019 1:40 PM

## 2019-11-13 NOTE — Progress Notes (Signed)
EKG CRITICAL VALUE     12 lead EKG performed.  Critical value noted.  Arsenio Loader, RN notified.   Ayesha Rumpf Timofey Carandang, CCT 11/13/2019 7:08 AM

## 2019-11-13 NOTE — Progress Notes (Signed)
Progress Note  Patient Name: Alan Morrison Date of Encounter: 11/13/2019  [facility] Cardiologist: Dr. Peter Martinique  Subjective   Postop day #1 inferior STEMI treated with PCI and drug-eluting stenting by Dr. Tamala Julian.  Patient denies chest pain or shortness of breath.  Inpatient Medications    Scheduled Meds: . vitamin C  500 mg Oral QPM  . aspirin  81 mg Oral Daily  . Chlorhexidine Gluconate Cloth  6 each Topical Daily  . cholecalciferol  2,000 Units Oral Daily  . clopidogrel  75 mg Oral Q breakfast  . finasteride  5 mg Oral Daily  . heparin  5,000 Units Subcutaneous Q8H  . insulin aspart  0-15 Units Subcutaneous TID WC  . insulin aspart  0-5 Units Subcutaneous QHS  . insulin aspart  20 Units Subcutaneous BID WC  . insulin glargine  75 Units Subcutaneous QHS  . losartan  25 mg Oral Daily  . metoprolol tartrate  12.5 mg Oral BID  . multivitamin with minerals  1 tablet Oral Daily  . probenecid  250 mg Oral QODAY  . rosuvastatin  40 mg Oral q1800  . sodium chloride flush  3 mL Intravenous Q12H  . tamsulosin  0.4 mg Oral Daily   Continuous Infusions: . sodium chloride    . sodium chloride     PRN Meds: sodium chloride, acetaminophen, ondansetron (ZOFRAN) IV, oxyCODONE, sodium chloride flush   Vital Signs    Vitals:   11/13/19 0400 11/13/19 0600 11/13/19 0700 11/13/19 0735  BP: (!) 91/56 102/67 107/68   Pulse: 61 59 60   Resp: 22 (!) 25 (!) 24   Temp:    98.8 F (37.1 C)  TempSrc:      SpO2: 93% 93% 93%   Weight:      Height:        Intake/Output Summary (Last 24 hours) at 11/13/2019 0819 Last data filed at 11/12/2019 2100 Gross per 24 hour  Intake 929.34 ml  Output --  Net 929.34 ml   Last 3 Weights 11/12/2019 07/13/2019 07/07/2019  Weight (lbs) 210 lb 217 lb 5 oz 221 lb 6 oz  Weight (kg) 95.255 kg 98.572 kg 100.415 kg      Telemetry    Sinus rhythm with an occasional dropped beat consistent with second-degree AV block.- Personally Reviewed  ECG     Sinus rhythm at 68 with inferior Q waves and persistent inferior ST segment elevation with reciprocal anterior depression.- Personally Reviewed  Physical Exam   GEN: No acute distress.   Neck: No JVD Cardiac: RRR, no murmurs, rubs, or gallops.  Respiratory: Clear to auscultation bilaterally. GI: Soft, nontender, non-distended  MS: No edema; No deformity. Neuro:  Nonfocal  Psych: Normal affect  Extremities: Right radial puncture site appears stable   Labs    High Sensitivity Troponin:   Recent Labs  Lab 11/12/19 1143 11/12/19 1807 11/12/19 2140  TROPONINIHS 9,809* >27,000* >27,000*      Chemistry Recent Labs  Lab 11/12/19 1143 11/12/19 1440 11/13/19 0223  NA 141  --  140  K 4.6  --  4.6  CL 109  --  110  CO2 19*  --  19*  GLUCOSE 120*  --  104*  BUN 35*  --  36*  CREATININE 1.63* 1.53* 1.50*  CALCIUM 9.5  --  8.9  PROT 7.0  --   --   ALBUMIN 3.8  --   --   AST 126*  --   --  ALT 60*  --   --   ALKPHOS 43  --   --   BILITOT 1.1  --   --   GFRNONAA 43* 46* 47*  GFRAA 49* 53* 55*  ANIONGAP 13  --  11     Hematology Recent Labs  Lab 11/12/19 1143 11/12/19 1440 11/13/19 0223  WBC 11.8* 11.8* 9.7  RBC 5.05 4.66 4.39  HGB 15.1 13.8 13.4  HCT 45.4 41.1 39.5  MCV 89.9 88.2 90.0  MCH 29.9 29.6 30.5  MCHC 33.3 33.6 33.9  RDW 13.3 13.4 13.6  PLT 210 163 173    BNP Recent Labs  Lab 11/12/19 1807  BNP 311.4*     DDimer No results for input(s): DDIMER in the last 168 hours.   Radiology    CARDIAC CATHETERIZATION  Result Date: 11/12/2019  A stent was successfully placed.   Acute inferior ST elevation myocardial infarction with ongoing pain for greater than 9 hours.  Totally occluded proximal to mid RCA treated with 22 x 3.0 Onyx DES postdilated to 3.5 mm with TIMI grade III flow and resolution of symptoms.  The PDA contains segmental mid vessel 50 percent stenosis.  Widely patent, short left main  Widely patent LAD with proximal to mid eccentric  50 percent stenosis and luminal irregularities beyond.  Widely patent circumflex with luminal irregularities.  LV reveals inferior wall hypokinesis.  EF 55 percent.  LVEDP 16 mmHg. RECOMMENDATIONS:  Aggressive risk factor modification.  If A1c significantly elevated, consider adding SGLT2 therapy.  Lipid panel and if LDL greater than 70, intensify management by adding ezetimibe or PCSK9.  Phase 2 cardiac rehab.   DG Chest Port 1 View  Result Date: 11/12/2019 CLINICAL DATA:  STEMI, history coronary artery disease, type II diabetes mellitus, former smoker, GERD, hypertension EXAM: PORTABLE CHEST 1 VIEW COMPARISON:  Portable exam 1158 hours without priors for comparison. FINDINGS: Normal heart size, mediastinal contours, and pulmonary vascularity. Lungs clear. No pulmonary infiltrate, pleural effusion or pneumothorax. Scattered endplate spur formation thoracic spine. No acute osseous findings. IMPRESSION: No acute abnormalities. Electronically Signed   By: Lavonia Dana M.D.   On: 11/12/2019 12:19    Cardiac Studies   Cardiac catheterization/PCI and drug-eluting stent (11/12/2019  Conclusion    A stent was successfully placed.    Acute inferior ST elevation myocardial infarction with ongoing pain for greater than 9 hours.  Totally occluded proximal to mid RCA treated with 22 x 3.0 Onyx DES postdilated to 3.5 mm with TIMI grade III flow and resolution of symptoms.  The PDA contains segmental mid vessel 50 percent stenosis.  Widely patent, short left main  Widely patent LAD with proximal to mid eccentric 50 percent stenosis and luminal irregularities beyond.  Widely patent circumflex with luminal irregularities.  LV reveals inferior wall hypokinesis.  EF 55 percent.  LVEDP 16 mmHg.  RECOMMENDATIONS:   Aggressive risk factor modification.  If A1c significantly elevated, consider adding SGLT2 therapy.  Lipid panel and if LDL greater than 70, intensify management by adding  ezetimibe or PCSK9.  Phase 2 cardiac rehab. Coronary Diagrams  Diagnostic Dominance: Right  Intervention       Patient Profile     Alan Morrison is a 69 y.o. male with type 2 diabetes mellitus with vascular complications, stress cardiomyopathy (2000), obstructive sleep apnea treated with CPAP, hyperlipidemia, ex-smoker, and known history of nonobstructive CAD by angiography 2018.  Presented with an 8-10-hour duration chest pressure with inferior ST elevation and inferior Q waves  noted.  Chest pain is ongoing, graded of 6/10 and has been unremitting over the timeframe noted.   Assessment & Plan    1: Late presenting inferior STEMI-the patient apparently waited approxi-10 hours at home prior to coming into the hospital.  He underwent radial diagnostic cath, PCI drug-eluting stenting of the thrombotically occluded mid dominant RCA stenosis by Dr. Tamala Julian.  It appeared that he performed aspiration thrombectomy followed by PCI drug-eluting stenting with a 3 mm x 22 mm long resolute Onyx postdilated to 3.5 mm.  He had minimal disease in his LAD and circumflex with inferobasal hypokinesia.  He was loaded with Plavix because he had been on Xarelto for DVT .  He will need triple therapy for 1 month after which we can discontinue aspirin.  2: Ischemic cardiomyopathy-await formal 2D echo rating  3: Hyperlipidemia-anemia on Crestor 40 with an LDL 47.  4: Insulin-dependent diabetes-on Lantus and NovoLog as well as Metformin as an outpatient.  We will hold Metformin today and restart tomorrow  5: Essential hypertension-blood pressures controlled on metoprolol and losartan.  6: Obstructive sleep apnea-on CPAP at home.  7: Chronic renal insufficiency-most likely related to hypertension and diabetes.  Patient stable postop day 1 inferior STEMI.  Will transfer to a telemetry floor, cardiac rehab, ambulation, restart Xarelto low-dose tomorrow for his prior DVT.  May be appropriate for early discharge  tomorrow.  For questions or updates, please contact [facility] Please consult [email or web address] for contact info under        Signed, Quay Burow, MD  11/13/2019, 8:19 AM

## 2019-11-13 NOTE — Progress Notes (Signed)
  Echocardiogram 2D Echocardiogram has been performed.  Jennette Dubin 11/13/2019, 11:53 AM

## 2019-11-14 ENCOUNTER — Telehealth: Payer: Self-pay | Admitting: Physician Assistant

## 2019-11-14 DIAGNOSIS — I2119 ST elevation (STEMI) myocardial infarction involving other coronary artery of inferior wall: Secondary | ICD-10-CM | POA: Diagnosis not present

## 2019-11-14 LAB — GLUCOSE, CAPILLARY
Glucose-Capillary: 87 mg/dL (ref 70–99)
Glucose-Capillary: 121 mg/dL — ABNORMAL HIGH (ref 70–99)

## 2019-11-14 LAB — BASIC METABOLIC PANEL
Anion gap: 8 (ref 5–15)
BUN: 31 mg/dL — ABNORMAL HIGH (ref 8–23)
CO2: 21 mmol/L — ABNORMAL LOW (ref 22–32)
Calcium: 8.7 mg/dL — ABNORMAL LOW (ref 8.9–10.3)
Chloride: 110 mmol/L (ref 98–111)
Creatinine, Ser: 1.44 mg/dL — ABNORMAL HIGH (ref 0.61–1.24)
GFR calc Af Amer: 57 mL/min — ABNORMAL LOW (ref >60)
GFR calc non Af Amer: 50 mL/min — ABNORMAL LOW (ref >60)
Glucose, Bld: 104 mg/dL — ABNORMAL HIGH (ref 70–99)
Potassium: 3.9 mmol/L (ref 3.5–5.1)
Sodium: 139 mmol/L (ref 135–145)

## 2019-11-14 MED ORDER — ROSUVASTATIN CALCIUM 40 MG PO TABS: 40.0000 mg | ORAL_TABLET | Freq: Every day | ORAL | 3 refills | Status: DC

## 2019-11-14 MED ORDER — CLOPIDOGREL BISULFATE 75 MG PO TABS: 75.0000 mg | ORAL_TABLET | Freq: Every day | ORAL | 3 refills | Status: DC

## 2019-11-14 MED ORDER — BASAGLAR KWIKPEN 100 UNIT/ML ~~LOC~~ SOPN: 75.0000 [IU] | PEN_INJECTOR | Freq: Every day | SUBCUTANEOUS | 0 refills | Status: DC

## 2019-11-14 NOTE — Progress Notes (Signed)
CARDIAC REHAB PHASE I   PRE:  Rate/Rhythm: 77 SR  BP:  Supine: 130/78  Sitting:   Standing:    SaO2: 94%RA  MODE:  Ambulation: 600 ft   POST:  Rate/Rhythm: 95 SR  BP:  Supine:   Sitting:   Standing: 133/78   SaO2:  0937-1000 Pt walked 600 ft on RA with steady gait and tolerated well. No CP. Ready for d/c. No questions re ed done yesterday.   Graylon Good, RN BSN  11/14/2019 9:59 AM

## 2019-11-14 NOTE — Discharge Instructions (Signed)
No driving for 1 week. No lifting over 5 lbs for 1 week. No sexual activity for 1 week. Keep procedure site clean & dry. If you notice increased pain, swelling, bleeding or pus, call/return! You may shower, but no soaking baths/hot tubs/pools for 1 week.  Heart Attack A heart attack occurs when blood and oxygen supply to the heart is cut off. A heart attack causes damage to the heart that cannot be fixed. A heart attack is also called a myocardial infarction, or MI. If you think you are having a heart attack, do not wait to see if the symptoms will go away. Get medical help right away. What are the causes? This condition may be caused by:  A fatty substance (plaque) in the blood vessels (arteries). This can block the flow of blood to the heart.  A blood clot in the blood vessels that go to the heart. The blood clot blocks blood flow.  Low blood pressure.  An abnormal heartbeat.  Some diseases, such as problems in red blood cells (anemia)orproblems in breathing (respiratory failure).  Tightening (spasm) of a blood vessel that cuts off blood to the heart.  A tear in a blood vessel of the heart.  High blood pressure. What increases the risk? The following factors may make you more likely to develop this condition:  Aging. The older you are, the higher your risk.  Having a personal or family history of chest pain, heart attack, stroke, or narrowing of the arteries in the legs, arms, head, or stomach (peripheral artery disease).  Being male.  Smoking.  Not getting regular exercise.  Being overweight or obese.  Having high blood pressure.  Having high cholesterol.  Having diabetes.  Drinking too much alcohol.  Using illegal drugs, such as cocaine or methamphetamine. What are the signs or symptoms? Symptoms of this condition include:  Chest pain. It may feel like: ? Crushing or squeezing. ? Tightness, pressure, fullness, or heaviness.  Pain in the arm, neck, jaw, back,  or upper body.  Shortness of breath.  Heartburn.  Upset stomach (indigestion).  Feeling like you may vomit (nauseous).  Cold sweats.  Feeling tired.  Sudden light-headedness. How is this treated? A heart attack must be treated as soon as possible. Treatment may include:  Medicines to: ? Break up or dissolve blood clots. ? Thin blood and help prevent blood clots. ? Treat blood pressure. ? Improve blood flow to the heart. ? Reduce pain. ? Reduce cholesterol.  Procedures to widen a blocked artery and keep it open.  Open heart surgery.  Receiving oxygen.  Making your heart strong again (cardiac rehabilitation) through exercise, education, and counseling. Follow these instructions at home: Medicines  Take over-the-counter and prescription medicines only as told by your doctor. You may need to take medicine: ? To keep your blood from clotting too easily. ? To control blood pressure. ? To lower cholesterol. ? To control heart rhythms.  Do not take these medicines unless your doctor says it is okay: ? NSAIDs, such as ibuprofen. ? Supplements that have vitamin A, vitamin E, or both. ? Hormone replacement therapy that has estrogen with or without progestin. Lifestyle      Do not use any products that have nicotine or tobacco, such as cigarettes, e-cigarettes, and chewing tobacco. If you need help quitting, ask your doctor.  Avoid secondhand smoke.  Exercise regularly. Ask your doctor about a cardiac rehab program.  Eat heart-healthy foods. Your doctor will tell you what foods to  eat.  Stay at a healthy weight.  Lower your stress level.  Do not use illegal drugs. Alcohol use  Do not drink alcohol if: ? Your doctor tells you not to drink. ? You are pregnant, may be pregnant, or are planning to become pregnant.  If you drink alcohol: ? Limit how much you use to:  0-1 drink a day for women.  0-2 drinks a day for men. ? Know how much alcohol is in your  drink. In the [place] one drink equals one 12 oz bottle of beer (355 mL), one 5 oz glass of wine (148 mL), or one 1 oz glass of hard liquor (44 mL). General instructions  Work with your doctor to treat other problems you may have, such as diabetes or high blood pressure.  Get screened for depression. Get treatment if needed.  Keep your vaccines up to date. Get the flu shot (influenza vaccine) every year.  Keep all follow-up visits as told by your doctor. This is important. Contact a doctor if:  You feel very sad.  You have trouble doing your daily activities. Get help right away if:  You have sudden, unexplained discomfort in your chest, arms, back, neck, jaw, or upper body.  You have shortness of breath.  You have sudden sweating or clammy skin.  You feel like you may vomit.  You vomit.  You feel tired or weak.  You get light-headed or dizzy.  You feel your heart beating fast.  You feel your heart skipping beats.  You have blood pressure that is higher than 180/120. These symptoms may be an emergency. Do not wait to see if the symptoms will go away. Get medical help right away. Call your local emergency services (911 in the [place] Do not drive yourself to the hospital. Summary  A heart attack occurs when blood and oxygen supply to the heart is cut off.  Do not take NSAIDs unless your doctor says it is okay.  Do not smoke. Avoid secondhand smoke.  Exercise regularly. Ask your doctor about a cardiac rehab program. This information is not intended to replace advice given to you by your health care provider. Make sure you discuss any questions you have with your health care provider. Document Revised: 07/18/2018 Document Reviewed: 07/18/2018 Elsevier Patient Education  [place]. Continue aspirin, plavix and Xarelto, stop aspirin after 1 month (Stop on 8/28), then continue plavix and Xarelto after that.     Information on my medicine - XARELTO  (Rivaroxaban)  Why was Xarelto prescribed for you? Xarelto was prescribed for you to reduce the risk of blood clots forming. The medical term for these abnormal blood clots is venous thromboembolism (VTE).  What do you need to know about xarelto ? Take your Xarelto ONCE DAILY at the same time every day. You may take it either with or without food.  If you have difficulty swallowing the tablet whole, you may crush it and mix in applesauce just prior to taking your dose.  Take Xarelto exactly as prescribed by your doctor and DO NOT stop taking Xarelto without talking to the doctor who prescribed the medication.  Stopping without other VTE prevention medication to take the place of Xarelto may increase your risk of developing a clot.  After discharge, you should have regular check-up appointments with your healthcare provider that is prescribing your Xarelto.    What do you do if you miss a dose? If you miss a dose, take it as soon as  you remember on the same day then continue your regularly scheduled once daily regimen the next day. Do not take two doses of Xarelto on the same day.   Important Safety Information A possible side effect of Xarelto is bleeding. You should call your healthcare provider right away if you experience any of the following: ? Bleeding from an injury or your nose that does not stop. ? Unusual colored urine (red or dark brown) or unusual colored stools (red or black). ? Unusual bruising for unknown reasons. ? A serious fall or if you hit your head (even if there is no bleeding).  Some medicines may interact with Xarelto and might increase your risk of bleeding while on Xarelto. To help avoid this, consult your healthcare provider or pharmacist prior to using any new prescription or non-prescription medications, including herbals, vitamins, non-steroidal anti-inflammatory drugs (NSAIDs) and supplements.  This website has more information on Xarelto:  [email or web address].[email or web address]     De-escalation of Triple Therapy Post-PCI  Underwent cardiac catheterization with placement of drug-eluting stent on 11/12/2019. Plan for triple therapy with aspirin 81 mg and clopidogrel (Plavix) in addition to oral anticoagulation using warfarin (Coumadin). Plan to discontinue aspirin on 12/14/2019.

## 2019-11-14 NOTE — Telephone Encounter (Signed)
TOC Pateint- Please call Patient-  Pt have an appt with Almyra Deforest on 11-27-19

## 2019-11-14 NOTE — Progress Notes (Addendum)
Progress Note  Patient Name: Alan Morrison Date of Encounter: 11/14/2019  [facility] Cardiologist: Peter Martinique, MD  Subjective   Denies any CP or SOB.   Inpatient Medications    Scheduled Meds:  vitamin C  500 mg Oral QPM   aspirin  81 mg Oral Daily   Chlorhexidine Gluconate Cloth  6 each Topical Daily   cholecalciferol  2,000 Units Oral Daily   clopidogrel  75 mg Oral Q breakfast   finasteride  5 mg Oral Daily   insulin aspart  0-15 Units Subcutaneous TID WC   insulin aspart  0-5 Units Subcutaneous QHS   insulin glargine  75 Units Subcutaneous QHS   losartan  25 mg Oral Daily   metFORMIN  1,000 mg Oral BID WC   metoprolol tartrate  12.5 mg Oral BID   multivitamin with minerals  1 tablet Oral Daily   probenecid  250 mg Oral QODAY   rivaroxaban  10 mg Oral Daily   rosuvastatin  40 mg Oral q1800   sodium chloride flush  3 mL Intravenous Q12H   tamsulosin  0.4 mg Oral Daily   Continuous Infusions:  sodium chloride     sodium chloride     PRN Meds: sodium chloride, acetaminophen, ondansetron (ZOFRAN) IV, oxyCODONE, sodium chloride flush   Vital Signs    Vitals:   11/13/19 1600 11/13/19 1700 11/13/19 2031 11/14/19 0514  BP:  (!) 101/61 113/76 113/82  Pulse:   72 76  Resp:  16 16 15   Temp: 98.5 F (36.9 C)  98.1 F (36.7 C) (!) 97.5 F (36.4 C)  TempSrc: Oral  Oral Oral  SpO2:   96% 96%  Weight:    (!) 97.3 kg  Height:        Intake/Output Summary (Last 24 hours) at 11/14/2019 0945 Last data filed at 11/14/2019 0815 Gross per 24 hour  Intake 483 ml  Output --  Net 483 ml   Last 3 Weights 11/14/2019 11/12/2019 07/13/2019  Weight (lbs) 214 lb 8 oz 210 lb 217 lb 5 oz  Weight (kg) 97.297 kg 95.255 kg 98.572 kg      Telemetry    NSR without significant ventricular ectopy` - Personally Reviewed  ECG    NSR with inferior ST elevation - Personally Reviewed  Physical Exam   GEN: No acute distress.   Neck: No JVD Cardiac: RRR, no murmurs, rubs, or  gallops.  Respiratory: Clear to auscultation bilaterally. GI: Soft, nontender, non-distended  MS: No edema; No deformity. Neuro:  Nonfocal  Psych: Normal affect   Labs    High Sensitivity Troponin:   Recent Labs  Lab 11/12/19 1143 11/12/19 1807 11/12/19 2140  TROPONINIHS 9,809* >27,000* >27,000*      Chemistry Recent Labs  Lab 11/12/19 1143 11/12/19 1143 11/12/19 1440 11/13/19 0223 11/14/19 0356  NA 141  --   --  140 139  K 4.6  --   --  4.6 3.9  CL 109  --   --  110 110  CO2 19*  --   --  19* 21*  GLUCOSE 120*  --   --  104* 104*  BUN 35*  --   --  36* 31*  CREATININE 1.63*   < > 1.53* 1.50* 1.44*  CALCIUM 9.5  --   --  8.9 8.7*  PROT 7.0  --   --   --   --   ALBUMIN 3.8  --   --   --   --  AST 126*  --   --   --   --   ALT 60*  --   --   --   --   ALKPHOS 43  --   --   --   --   BILITOT 1.1  --   --   --   --   GFRNONAA 43*   < > 46* 47* 50*  GFRAA 49*   < > 53* 55* 57*  ANIONGAP 13  --   --  11 8   < > = values in this interval not displayed.     Hematology Recent Labs  Lab 11/12/19 1143 11/12/19 1440 11/13/19 0223  WBC 11.8* 11.8* 9.7  RBC 5.05 4.66 4.39  HGB 15.1 13.8 13.4  HCT 45.4 41.1 39.5  MCV 89.9 88.2 90.0  MCH 29.9 29.6 30.5  MCHC 33.3 33.6 33.9  RDW 13.3 13.4 13.6  PLT 210 163 173    BNP Recent Labs  Lab 11/12/19 1807  BNP 311.4*     DDimer No results for input(s): DDIMER in the last 168 hours.   Radiology    CARDIAC CATHETERIZATION  Result Date: 11/12/2019  A stent was successfully placed.   Acute inferior ST elevation myocardial infarction with ongoing pain for greater than 9 hours.  Totally occluded proximal to mid RCA treated with 22 x 3.0 Onyx DES postdilated to 3.5 mm with TIMI grade III flow and resolution of symptoms.  The PDA contains segmental mid vessel 50 percent stenosis.  Widely patent, short left main  Widely patent LAD with proximal to mid eccentric 50 percent stenosis and luminal irregularities beyond.   Widely patent circumflex with luminal irregularities.  LV reveals inferior wall hypokinesis.  EF 55 percent.  LVEDP 16 mmHg. RECOMMENDATIONS:  Aggressive risk factor modification.  If A1c significantly elevated, consider adding SGLT2 therapy.  Lipid panel and if LDL greater than 70, intensify management by adding ezetimibe or PCSK9.  Phase 2 cardiac rehab.   DG Chest Port 1 View  Result Date: 11/12/2019 CLINICAL DATA:  STEMI, history coronary artery disease, type II diabetes mellitus, former smoker, GERD, hypertension EXAM: PORTABLE CHEST 1 VIEW COMPARISON:  Portable exam 1158 hours without priors for comparison. FINDINGS: Normal heart size, mediastinal contours, and pulmonary vascularity. Lungs clear. No pulmonary infiltrate, pleural effusion or pneumothorax. Scattered endplate spur formation thoracic spine. No acute osseous findings. IMPRESSION: No acute abnormalities. Electronically Signed   By: Lavonia Dana M.D.   On: 11/12/2019 12:19   ECHOCARDIOGRAM COMPLETE  Result Date: 11/13/2019    ECHOCARDIOGRAM REPORT   Patient Name:   Alan Morrison Date of Exam: 11/13/2019 Medical Rec #:  [id number]     Height:       68.0 in Accession #:    [id number]    Weight:       210.0 lb Date of Birth:  1950-10-12     BSA:          2.087 m Patient Age:    69 years      BP:           115/65 mmHg Patient Gender: M             HR:           62 bpm. Exam Location:  Inpatient Procedure: 2D Echo and Intracardiac Opacification Agent Indications:    Acute Coronary Syndrome I24.9  History:        Patient has no prior history of Echocardiogram examinations.  CAD; Risk Factors:Dyslipidemia and Diabetes.  Sonographer:    Alan Morrison RDCS (AE) Referring Phys: [place]  1. Left ventricular ejection fraction, by estimation, is 45 to 50%. The left ventricle has mildly decreased function. The left ventricle demonstrates regional wall motion abnormalities (see scoring diagram/findings for  description). There is mild concentric left ventricular hypertrophy. Left ventricular diastolic function could not be evaluated.  2. Right ventricular systolic function is mildly reduced. The right ventricular size is mildly enlarged. There is normal pulmonary artery systolic pressure. The estimated right ventricular systolic pressure is 96.7 mmHg.  3. The mitral valve is grossly normal. Mild mitral valve regurgitation. No evidence of mitral stenosis.  4. The aortic valve is tricuspid. Aortic valve regurgitation is not visualized. No aortic stenosis is present.  5. The inferior vena cava is dilated in size with <50% respiratory variability, suggesting right atrial pressure of 15 mmHg. FINDINGS  Left Ventricle: Left ventricular ejection fraction, by estimation, is 45 to 50%. The left ventricle has mildly decreased function. The left ventricle demonstrates regional wall motion abnormalities. Definity contrast agent was given IV to delineate the left ventricular endocardial borders. The left ventricular internal cavity size was normal in size. There is mild concentric left ventricular hypertrophy. Left ventricular diastolic function could not be evaluated.  LV Wall Scoring: The entire inferior wall and posterior wall are hypokinetic. Right Ventricle: The right ventricular size is mildly enlarged. No increase in right ventricular wall thickness. Right ventricular systolic function is mildly reduced. There is normal pulmonary artery systolic pressure. The tricuspid regurgitant velocity  is 1.49 m/s, and with an assumed right atrial pressure of 15 mmHg, the estimated right ventricular systolic pressure is 89.3 mmHg. Left Atrium: Left atrial size was normal in size. Right Atrium: Right atrial size was normal in size. Pericardium: Trivial pericardial effusion is present. Mitral Valve: The mitral valve is grossly normal. Mild mitral valve regurgitation. No evidence of mitral valve stenosis. Tricuspid Valve: The tricuspid  valve is grossly normal. Tricuspid valve regurgitation is trivial. No evidence of tricuspid stenosis. Aortic Valve: The aortic valve is tricuspid. Aortic valve regurgitation is not visualized. No aortic stenosis is present. Pulmonic Valve: The pulmonic valve was grossly normal. Pulmonic valve regurgitation is not visualized. No evidence of pulmonic stenosis. Aorta: The aortic root and ascending aorta are structurally normal, with no evidence of dilitation. Venous: The inferior vena cava is dilated in size with less than 50% respiratory variability, suggesting right atrial pressure of 15 mmHg. IAS/Shunts: The atrial septum is grossly normal.  LEFT VENTRICLE PLAX 2D LVIDd:         3.93 cm  Diastology LVIDs:         3.39 cm  LV e' lateral: 6.08 cm/s LV PW:         1.21 cm  LV e' medial:  3.65 cm/s LV IVS:        1.25 cm LVOT diam:     2.20 cm LV SV:         55 LV SV Index:   27 LVOT Area:     3.80 cm  RIGHT VENTRICLE RV S prime:     9.41 cm/s TAPSE (M-mode): 1.0 cm LEFT ATRIUM             Index       RIGHT ATRIUM           Index LA diam:        3.50 cm 1.68 cm/m  RA Area:  16.50 cm LA Vol (A2C):   53.1 ml 25.45 ml/m RA Volume:   49.40 ml  23.68 ml/m LA Vol (A4C):   24.1 ml 11.55 ml/m LA Biplane Vol: 36.2 ml 17.35 ml/m  AORTIC VALVE LVOT Vmax:   72.50 cm/s LVOT Vmean:  48.500 cm/s LVOT VTI:    0.146 m  AORTA Ao Root diam: 3.50 cm MR Peak grad:    78.1 mmHg   TRICUSPID VALVE MR Mean grad:    49.0 mmHg   TR Peak grad:   8.9 mmHg MR Vmax:         442.00 cm/s TR Vmax:        149.00 cm/s MR Vmean:        327.0 cm/s MR PISA:         0.57 cm    SHUNTS MR PISA Eff ROA: 4 mm       Systemic VTI:  0.15 m MR PISA Radius:  0.30 cm     Systemic Diam: 2.20 cm Eleonore Chiquito MD Electronically signed by Eleonore Chiquito MD Signature Date/Time: 11/13/2019/3:33:48 PM    Final     Cardiac Studies   Cath 11/12/2019 A stent was successfully placed.   Acute inferior ST elevation myocardial infarction with ongoing pain for  greater than 9 hours. Totally occluded proximal to mid RCA treated with 22 x 3.0 Onyx DES postdilated to 3.5 mm with TIMI grade III flow and resolution of symptoms.  The PDA contains segmental mid vessel 50 percent stenosis. Widely patent, short left main Widely patent LAD with proximal to mid eccentric 50 percent stenosis and luminal irregularities beyond. Widely patent circumflex with luminal irregularities. LV reveals inferior wall hypokinesis.  EF 55 percent.  LVEDP 16 mmHg.   RECOMMENDATIONS:   Aggressive risk factor modification. If A1c significantly elevated, consider adding SGLT2 therapy. Lipid panel and if LDL greater than 70, intensify management by adding ezetimibe or PCSK9. Phase 2 cardiac rehab.   Echo 11/13/2019 1. Left ventricular ejection fraction, by estimation, is 45 to 50%. The  left ventricle has mildly decreased function. The left ventricle  demonstrates regional wall motion abnormalities (see scoring  diagram/findings for description). There is mild  concentric left ventricular hypertrophy. Left ventricular diastolic  function could not be evaluated.   2. Right ventricular systolic function is mildly reduced. The right  ventricular size is mildly enlarged. There is normal pulmonary artery  systolic pressure. The estimated right ventricular systolic pressure is  25.0 mmHg.   3. The mitral valve is grossly normal. Mild mitral valve regurgitation.  No evidence of mitral stenosis.   4. The aortic valve is tricuspid. Aortic valve regurgitation is not  visualized. No aortic stenosis is present.   5. The inferior vena cava is dilated in size with <50% respiratory  variability, suggesting right atrial pressure of 15 mmHg.    Patient Profile     69 y.o. male with PMH of DM II, OSA on CPAP, HTN, HLD, and CAD presented with inferior STEMI  Assessment & Plan    1. Late presenting inferior STEMI  - previous cath in 2018 showed nonobstructive disease  - Cath 11/13/2019  occluded mid RCA treated with aspiration thrombectomy and DES placement  - on triple therapy with ASA, plavix and Xarelto. Plan to D/C ASA after one 1 month  - stable for discharge today.  2. Ischmic cardiomyopathy  - Echo showed EF 45-50%  3. HTN: on metoprolol and losartan  4. HLD: on Crestor  5. DM  II: continue Lantus. Per patient, he was having frequent hypoglycemic spells at home, will stop Novolog and wait for him to see his endocrinologist for further medication adjustment.   6. CKD stage III  7. DVT: on Xarelto. Will defer duration of therapy to PCP, per previous PCP note, patient had 1st unprovoked DVT in 04/2018.   8. OSA on CPAP.   For questions or updates, please contact [place] Please consult [email or web address] for contact info under        Signed, Almyra Deforest, [place]  11/14/2019, 9:45 AM     Agree with note by Almyra Deforest PA-C  OK for DC home. POD # 2 Inferior STEMI Rx with PCI/DES by DR Tamala Julian. Residual non critical CAD . EF 45%. Clinically stable. No CP. On 'Triple Rx for 30 days including ASA 81 mg, Plavix and low dos Xarelto for DVT. OK to DC ASA after 30 days. Will arrange OP F/U with Dr Martinique    Clancy Leiner J. Foxx Klarich, M.D., [place], [facility], [place] [address] [place], [place]  [id number]  [phone] 11/14/2019 10:03 AM

## 2019-11-14 NOTE — Discharge Summary (Addendum)
Discharge Summary    Patient ID: Alan Morrison MRN: [id number]; DOB: [birth year]  Admit date: 11/12/2019 Discharge date: 11/14/2019  Primary Care Provider: [name], MD  Primary Cardiologist: Peter Martinique, MD  Primary Electrophysiologist:  None   Discharge Diagnoses    Principal Problem:   Acute ST elevation myocardial infarction (STEMI) of inferior wall [facility]) Active Problems:   Type 2 diabetes, uncontrolled, with neuropathy ([place])   CAD (coronary artery disease), native coronary artery   Sleep apnea   Essential hypertension, benign   Hyperlipidemia associated with type 2 diabetes mellitus ([employer])   Stress-induced cardiomyopathy   DVT of deep femoral vein, left (HCC)   ST elevation myocardial infarction (STEMI) of inferior wall [facility])    Diagnostic Studies/Procedures    Cath 11/12/2019 A stent was successfully placed.   Acute inferior ST elevation myocardial infarction with ongoing pain for greater than 9 hours. Totally occluded proximal to mid RCA treated with 22 x 3.0 Onyx DES postdilated to 3.5 mm with TIMI grade III flow and resolution of symptoms.  The PDA contains segmental mid vessel 50 percent stenosis. Widely patent, short left main Widely patent LAD with proximal to mid eccentric 50 percent stenosis and luminal irregularities beyond. Widely patent circumflex with luminal irregularities. LV reveals inferior wall hypokinesis.  EF 55 percent.  LVEDP 16 mmHg.   RECOMMENDATIONS:   Aggressive risk factor modification. If A1c significantly elevated, consider adding SGLT2 therapy. Lipid panel and if LDL greater than 70, intensify management by adding ezetimibe or PCSK9. Phase 2 cardiac rehab.     Echo 11/13/2019 1. Left ventricular ejection fraction, by estimation, is 45 to 50%. The  left ventricle has mildly decreased function. The left ventricle  demonstrates regional wall motion abnormalities (see scoring  diagram/findings for description). There is mild    concentric left ventricular hypertrophy. Left ventricular diastolic  function could not be evaluated.   2. Right ventricular systolic function is mildly reduced. The right  ventricular size is mildly enlarged. There is normal pulmonary artery  systolic pressure. The estimated right ventricular systolic pressure is  01.0 mmHg.   3. The mitral valve is grossly normal. Mild mitral valve regurgitation.  No evidence of mitral stenosis.   4. The aortic valve is tricuspid. Aortic valve regurgitation is not  visualized. No aortic stenosis is present.   5. The inferior vena cava is dilated in size with <50% respiratory  variability, suggesting right atrial pressure of 15 mmHg.    _____________   History of Present Illness     Alan Morrison is a 69 y.o. male with type 2 diabetes mellitus with vascular complications, stress cardiomyopathy (2000), obstructive sleep apnea treated with CPAP, hyperlipidemia, ex-smoker, and known history of nonobstructive CAD by angiography 2018.  Presented with an 8-10-hour duration chest pressure with inferior ST elevation and inferior Q waves noted.  Chest pain is ongoing, graded of 6/10 and has been unremitting over the timeframe noted. Alan Morrison was awakened at around 2 AM with pressure in his chest.  He did not use nitroglycerin or call for help.  The discomfort persisted through the night and eventually around 11:00 AM he had his wife bring him to the hospital for evaluation where an EKG at 11:30 AM led to activation of STEMI..  Initial EKG tracing identified inferior Q waves with persistent ST elevation compatible with inferior acute ischemia.  Mild dyspnea was present.  No prior history of such discomfort.  Previous cardiac evaluation was for other symptoms, never of this  variety.  He has multiple risk factors as noted above.  Hospital Course     Consultants: N/A  Since patient waited approximately 10 hours at home prior to coming to the hospital, he likely had  late presenting inferior STEMI.  He was taken urgently to the Cath Lab by Dr. Tamala Julian and underwent cardiac catheterization.  Cardiac catheterization revealed occluded proximal to mid RCA treated with a 3.0 x 22 mm Onyx DES postdilated to 3.5 mm, widely patent left main, 50% proximal to mid LAD, luminal Ureh pleurodesed in the left circumflex, EF 55%, LVEDP 16 mmHg.  Postprocedure, he was placed on aspirin and Plavix.  Xarelto was resumed for history of DVT.  Serial enzymes trended up to greater than 27,000 after initial value at 9809.  Patient was seen in the morning on 11/14/2019 at which time he denies any significant chest discomfort or shortness of breath.  He is deemed stable for discharge from cardiology perspective.    According to the patient, he was previously noncompliant with his insulin therapy, that is why his blood sugars sometimes run very low when he is actually taking the short acting insulin.  His blood sugar seems to be stable on the long-acting insulin at this time.  After discussing with our clinical pharmacist, we decided to discontinue his short acting insulin and continue him only on the long-acting insulin until he can be seen by his new endocrinologist.  Ideally given his history of diabetes and coronary artery disease, SGLT2 inhibitor such as Jardiance or Wilder Glade may offer long-term benefit.   The plan is to continue aspirin, plavix and Xarelto for 1 month, then consider D/C aspirin after that and continue on plavix and Xarelto combination. Will defer duration of Xarelto to PCP.     Did the patient have an acute coronary syndrome (MI, NSTEMI, STEMI, etc) this admission?:  Yes                               AHA/ACC Clinical Performance & Quality Measures: Aspirin prescribed? - Yes ADP Receptor Inhibitor (Plavix/Clopidogrel, Brilinta/Ticagrelor or Effient/Prasugrel) prescribed (includes medically managed patients)? - Yes Beta Blocker prescribed? - Yes High Intensity Statin  (Lipitor 40-80mg  or Crestor 20-40mg ) prescribed? - Yes EF assessed during THIS hospitalization? - Yes For EF <40%, was ACEI/ARB prescribed? - Not Applicable (EF >/= 92%) For EF <40%, Aldosterone Antagonist (Spironolactone or Eplerenone) prescribed? - Not Applicable (EF >/= 11%) Cardiac Rehab Phase II ordered (including medically managed patients)? - Yes   _____________  Discharge Vitals Blood pressure (!) 133/81, pulse 81, temperature (!) 97.5 F (36.4 C), temperature source Oral, resp. rate 15, height 5\' 8"  (1.727 m), weight (!) 97.3 kg, SpO2 96 %.  Filed Weights   11/12/19 1127 11/14/19 0514  Weight: (!) 95.3 kg (!) 97.3 kg    Labs & Radiologic Studies    CBC Recent Labs    11/12/19 1440 11/13/19 0223  WBC 11.8* 9.7  HGB 13.8 13.4  HCT 41.1 39.5  MCV 88.2 90.0  PLT 163 941   Basic Metabolic Panel Recent Labs    11/13/19 0223 11/14/19 0356  NA 140 139  K 4.6 3.9  CL 110 110  CO2 19* 21*  GLUCOSE 104* 104*  BUN 36* 31*  CREATININE 1.50* 1.44*  CALCIUM 8.9 8.7*   Liver Function Tests Recent Labs    11/12/19 1143  AST 126*  ALT 60*  ALKPHOS 43  BILITOT 1.1  PROT 7.0  ALBUMIN 3.8   No results for input(s): LIPASE, AMYLASE in the last 72 hours. High Sensitivity Troponin:   Recent Labs  Lab 11/12/19 1143 11/12/19 1807 11/12/19 2140  TROPONINIHS 9,809* >27,000* >27,000*    BNP Invalid input(s): POCBNP D-Dimer No results for input(s): DDIMER in the last 72 hours. Hemoglobin A1C Recent Labs    11/13/19 0223  HGBA1C 6.3*   Fasting Lipid Panel Recent Labs    11/12/19 1143  CHOL 94  HDL 35*  LDLCALC 47  TRIG 60  CHOLHDL 2.7   Thyroid Function Tests No results for input(s): TSH, T4TOTAL, T3FREE, THYROIDAB in the last 72 hours.  Invalid input(s): FREET3 _____________  CARDIAC CATHETERIZATION  Result Date: 11/12/2019  A stent was successfully placed.   Acute inferior ST elevation myocardial infarction with ongoing pain for greater than 9  hours.  Totally occluded proximal to mid RCA treated with 22 x 3.0 Onyx DES postdilated to 3.5 mm with TIMI grade III flow and resolution of symptoms.  The PDA contains segmental mid vessel 50 percent stenosis.  Widely patent, short left main  Widely patent LAD with proximal to mid eccentric 50 percent stenosis and luminal irregularities beyond.  Widely patent circumflex with luminal irregularities.  LV reveals inferior wall hypokinesis.  EF 55 percent.  LVEDP 16 mmHg. RECOMMENDATIONS:  Aggressive risk factor modification.  If A1c significantly elevated, consider adding SGLT2 therapy.  Lipid panel and if LDL greater than 70, intensify management by adding ezetimibe or PCSK9.  Phase 2 cardiac rehab.   DG Chest Port 1 View  Result Date: 11/12/2019 CLINICAL DATA:  STEMI, history coronary artery disease, type II diabetes mellitus, former smoker, GERD, hypertension EXAM: PORTABLE CHEST 1 VIEW COMPARISON:  Portable exam 1158 hours without priors for comparison. FINDINGS: Normal heart size, mediastinal contours, and pulmonary vascularity. Lungs clear. No pulmonary infiltrate, pleural effusion or pneumothorax. Scattered endplate spur formation thoracic spine. No acute osseous findings. IMPRESSION: No acute abnormalities. Electronically Signed   By: Lavonia Dana M.D.   On: 11/12/2019 12:19   ECHOCARDIOGRAM COMPLETE  Result Date: 11/13/2019    ECHOCARDIOGRAM REPORT   Patient Name:   Alan Morrison Date of Exam: 11/13/2019 Medical Rec #:  [id number]     Height:       68.0 in Accession #:    [id number]    Weight:       210.0 lb Date of Birth:  10-28-1950     BSA:          2.087 m Patient Age:    22 years      BP:           115/65 mmHg Patient Gender: M             HR:           62 bpm. Exam Location:  Inpatient Procedure: 2D Echo and Intracardiac Opacification Agent Indications:    Acute Coronary Syndrome I24.9  History:        Patient has no prior history of Echocardiogram examinations.                 CAD; Risk  Factors:Dyslipidemia and Diabetes.  Sonographer:    Mikki Santee RDCS (AE) Referring Phys: [place]  1. Left ventricular ejection fraction, by estimation, is 45 to 50%. The left ventricle has mildly decreased function. The left ventricle demonstrates regional wall motion abnormalities (see scoring diagram/findings for description). There is mild concentric left ventricular hypertrophy.  Left ventricular diastolic function could not be evaluated.  2. Right ventricular systolic function is mildly reduced. The right ventricular size is mildly enlarged. There is normal pulmonary artery systolic pressure. The estimated right ventricular systolic pressure is 98.3 mmHg.  3. The mitral valve is grossly normal. Mild mitral valve regurgitation. No evidence of mitral stenosis.  4. The aortic valve is tricuspid. Aortic valve regurgitation is not visualized. No aortic stenosis is present.  5. The inferior vena cava is dilated in size with <50% respiratory variability, suggesting right atrial pressure of 15 mmHg. FINDINGS  Left Ventricle: Left ventricular ejection fraction, by estimation, is 45 to 50%. The left ventricle has mildly decreased function. The left ventricle demonstrates regional wall motion abnormalities. Definity contrast agent was given IV to delineate the left ventricular endocardial borders. The left ventricular internal cavity size was normal in size. There is mild concentric left ventricular hypertrophy. Left ventricular diastolic function could not be evaluated.  LV Wall Scoring: The entire inferior wall and posterior wall are hypokinetic. Right Ventricle: The right ventricular size is mildly enlarged. No increase in right ventricular wall thickness. Right ventricular systolic function is mildly reduced. There is normal pulmonary artery systolic pressure. The tricuspid regurgitant velocity  is 1.49 m/s, and with an assumed right atrial pressure of 15 mmHg, the estimated right  ventricular systolic pressure is 38.2 mmHg. Left Atrium: Left atrial size was normal in size. Right Atrium: Right atrial size was normal in size. Pericardium: Trivial pericardial effusion is present. Mitral Valve: The mitral valve is grossly normal. Mild mitral valve regurgitation. No evidence of mitral valve stenosis. Tricuspid Valve: The tricuspid valve is grossly normal. Tricuspid valve regurgitation is trivial. No evidence of tricuspid stenosis. Aortic Valve: The aortic valve is tricuspid. Aortic valve regurgitation is not visualized. No aortic stenosis is present. Pulmonic Valve: The pulmonic valve was grossly normal. Pulmonic valve regurgitation is not visualized. No evidence of pulmonic stenosis. Aorta: The aortic root and ascending aorta are structurally normal, with no evidence of dilitation. Venous: The inferior vena cava is dilated in size with less than 50% respiratory variability, suggesting right atrial pressure of 15 mmHg. IAS/Shunts: The atrial septum is grossly normal.  LEFT VENTRICLE PLAX 2D LVIDd:         3.93 cm  Diastology LVIDs:         3.39 cm  LV e' lateral: 6.08 cm/s LV PW:         1.21 cm  LV e' medial:  3.65 cm/s LV IVS:        1.25 cm LVOT diam:     2.20 cm LV SV:         55 LV SV Index:   27 LVOT Area:     3.80 cm  RIGHT VENTRICLE RV S prime:     9.41 cm/s TAPSE (M-mode): 1.0 cm LEFT ATRIUM             Index       RIGHT ATRIUM           Index LA diam:        3.50 cm 1.68 cm/m  RA Area:     16.50 cm LA Vol (A2C):   53.1 ml 25.45 ml/m RA Volume:   49.40 ml  23.68 ml/m LA Vol (A4C):   24.1 ml 11.55 ml/m LA Biplane Vol: 36.2 ml 17.35 ml/m  AORTIC VALVE LVOT Vmax:   72.50 cm/s LVOT Vmean:  48.500 cm/s LVOT VTI:    0.146 m  AORTA Ao Root diam: 3.50 cm MR Peak grad:    78.1 mmHg   TRICUSPID VALVE MR Mean grad:    49.0 mmHg   TR Peak grad:   8.9 mmHg MR Vmax:         442.00 cm/s TR Vmax:        149.00 cm/s MR Vmean:        327.0 cm/s MR PISA:         0.57 cm    SHUNTS MR PISA Eff ROA: 4  mm       Systemic VTI:  0.15 m MR PISA Radius:  0.30 cm     Systemic Diam: 2.20 cm Eleonore Chiquito MD Electronically signed by Eleonore Chiquito MD Signature Date/Time: 11/13/2019/3:33:48 PM    Final    Disposition   Pt is being discharged home today in good condition.  Follow-up Plans & Appointments     Follow-up Information     Almyra Deforest, [place] Follow up on 11/27/2019.   Specialties: Cardiology, Radiology Why: 1:15PM. Cardiology follow up Contact information: [address] [place] [address] [phone]                Discharge Instructions     Amb Referral to Cardiac Rehabilitation   Complete by: As directed    Diagnosis:  STEMI Coronary Stents     After initial evaluation and assessments completed: Virtual Based Care may be provided alone or in conjunction with Phase 2 Cardiac Rehab based on patient barriers.: Yes       Discharge Medications   Allergies as of 11/14/2019       Reactions   Victoza [liraglutide] Nausea Only   Irritation around injection site        Medication List     STOP taking these medications    ibuprofen 200 MG tablet Commonly known as: ADVIL   NovoLOG FlexPen 100 UNIT/ML FlexPen Generic drug: insulin aspart       TAKE these medications    acetaminophen 500 MG tablet Commonly known as: TYLENOL Take 1,000 mg by mouth 2 (two) times daily as needed for headache (pain).   aspirin EC 81 MG tablet Take 81 mg by mouth daily with supper. Swallow whole.   Basaglar KwikPen 100 UNIT/ML Inject 0.75 mLs (75 Units total) into the skin at bedtime.   clopidogrel 75 MG tablet Commonly known as: PLAVIX Take 1 tablet (75 mg total) by mouth daily with breakfast.   finasteride 5 MG tablet Commonly known as: PROSCAR Take 1 tablet (5 mg total) by mouth daily. What changed: when to take this   Insulin Pen Needle 31G X 8 MM Misc Commonly known as: B-D ULTRAFINE III SHORT PEN 1 Device by Other route 4 (four) times daily. USE  AS DIRECTED DAILY   losartan 25 MG tablet Commonly known as: COZAAR Take 1 tablet (25 mg total) by mouth daily. What changed: when to take this   metFORMIN 1000 MG tablet Commonly known as: GLUCOPHAGE Take 1,000 mg by mouth daily with breakfast.   metoprolol tartrate 25 MG tablet Commonly known as: LOPRESSOR TAKE 0.5 TABLETS (12.5 MG TOTAL) BY MOUTH 2 (TWO) TIMES DAILY.   multivitamin with minerals Tabs tablet Take 1 tablet by mouth daily with breakfast.   OneTouch Ultra test strip Generic drug: glucose blood USE TO CHECK SUGARS DAILY  AS DIRECTED   probenecid 500 MG tablet Commonly known as: BENEMID Take 0.5 tablets (250 mg total) by mouth every other day.  rivaroxaban 10 MG Tabs tablet Commonly known as: XARELTO Take 10 mg by mouth daily with breakfast.   rosuvastatin 40 MG tablet Commonly known as: CRESTOR Take 1 tablet (40 mg total) by mouth daily with supper. What changed: See the new instructions.   tamsulosin 0.4 MG Caps capsule Commonly known as: FLOMAX Take 1 capsule (0.4 mg total) by mouth daily. What changed: when to take this   TUMS PO Take 1 tablet by mouth daily as needed (acid reflux/indigestion).   vitamin C 500 MG tablet Commonly known as: ASCORBIC ACID Take 500 mg by mouth daily with supper.   Vitamin D 50 MCG (2000 UT) tablet Take 2,000 Units by mouth daily with breakfast.           Outstanding Labs/Studies   N/A  Duration of Discharge Encounter   Greater than 30 minutes including physician time.  SignedAlmyra Deforest, PA 11/14/2019, 12:56 PM    Agree with note by Almyra Deforest PA-C  Alan Morrison  was admitted with inferior MI this past Sunday and he had otherwise noncritical CAD and preserved LV function with an EF in the 45% range. Underwent PCI drug-eluting stenting of his RCA by Dr. Tamala Julian.He had been on low-dose Xarelto for prior DVT followed by Dr.  Jana Hakim. He stable for discharge home today on "triple therapy" with low-dose  aspirin, Plavix and Xarelto. He can discontinue the aspirin in 1 month continue Plavix and Xarelto. Follow-up with Dr. Martinique as an outpatient.  Lorretta Harp, M.D., [place], [facility], [place] [address] [place],   [address]  [phone] 11/14/2019 12:59 PM

## 2019-11-15 ENCOUNTER — Ambulatory Visit: Payer: Medicare HMO | Admitting: Cardiology

## 2019-11-15 ENCOUNTER — Telehealth (HOSPITAL_COMMUNITY): Payer: Self-pay

## 2019-11-15 NOTE — Telephone Encounter (Signed)
Pt insurance is active and benefits verified through [facility]. Co-pay $45.00, DED $0.00/$0.00 met, out of pocket $5,000.00/$75.00 met, co-insurance 0%. No pre-authorization required. Diane/[employer], 11/15/19 @ 344PM, RKV#[id number]  Will contact patient to see if he is interested in the Cardiac Rehab Program. If interested, patient will need to complete follow up appt. Once completed, patient will be contacted for scheduling upon review by the RN Navigator.

## 2019-11-15 NOTE — Telephone Encounter (Signed)
Patient is returning call.  °

## 2019-11-15 NOTE — Telephone Encounter (Signed)
Left message to call back  

## 2019-11-15 NOTE — Telephone Encounter (Signed)
Called patient to see if he is interested in the Cardiac Rehab Program. Patient expressed interest. Explained scheduling process and went over insurance, patient verbalized understanding. Will contact patient for scheduling once f/u has been completed.  °

## 2019-11-15 NOTE — Telephone Encounter (Signed)
Patient contacted regarding discharge from [facility] on 11/15/19.  Patient understands to follow up with provider Almyra Deforest, PA on 11/27/19 at 1:15 pm at [facility]. Patient understands discharge instructions? yes Patient understands medications and regiment? yes Patient understands to bring all medications to this visit? yes

## 2019-11-16 ENCOUNTER — Telehealth: Payer: Self-pay | Admitting: Physician Assistant

## 2019-11-16 NOTE — Telephone Encounter (Signed)
Called twice and says party is unable to accept my calls. Will try again later.

## 2019-11-16 NOTE — Telephone Encounter (Signed)
Pt c/o medication issue:  1. Name of Medication: rosuvastatin (CRESTOR) 40 MG tablet  2. How are you currently taking this medication (dosage and times per day)?   3. Are you having a reaction (difficulty breathing--STAT)?   4. What is your medication issue? Hazem is calling stating it is listed on his hospital summary about changes to his Crestor with no instructions on what changes were made. He is requesting a nurse call him to clarify how Almyra Deforest is wanting him to take this medication. Please advise.

## 2019-11-17 ENCOUNTER — Encounter: Payer: Self-pay | Admitting: Family Medicine

## 2019-11-17 MED ORDER — MUPIROCIN CALCIUM 2 % EX CREA: 1.0000 "application " | TOPICAL_CREAM | Freq: Three times a day (TID) | CUTANEOUS | 0 refills | Status: DC

## 2019-11-17 NOTE — Telephone Encounter (Signed)
Spoke with patient and explained there were no dose changes with crestor. May have been that new Rx said to take with supper and previous Rx said to just daily daily. He voiced understanding. No further assistance needed.

## 2019-11-19 NOTE — Progress Notes (Signed)
[facility]  [phone] [phone]    ID: Alan Morrison DOB: 1951-04-10  MR#: [id number]  SNK#:[id number]  Patient Care Team: [name], MD as PCP - General (Family Medicine) Martinique, Peter M, MD as Consulting Physician (Cardiology) Renato Shin, MD as Consulting Physician (Endocrinology) Chonita Gadea, Virgie Dad, MD as Consulting Physician (Hematology and Oncology) Belva Crome, MD as Consulting Physician (Cardiology) OTHER MD:   CHIEF COMPLAINT: Coagulopathy; CAD  CURRENT TREATMENT: As per cardiology   INTERVAL HISTORY: Alan Morrison returns today for follow-up and treatment of his coagulopathy accompanied by his wife.  He has been on rivaroxaban now for a year and a half.  He has had no further problems with venous clots and no bleeding problems.  The medication does cost him approximately $350 per month..   On 11/12/2019 he presented to the ED with chest pressure which had been present for about 12hours.  He was found to have EKG changes and a very high troponin level consistent with MI.  He was taken to catheterization where he was found to have a completely occluded RCA.  He was dilated and stented and started on Plavix and aspirin, on which he continues.  Echocardiogram 11/13/2019 showed an ejection fraction in the 45-50% range  REVIEW OF SYSTEMS: Alan Morrison currently is asymptomatic as far as his coronary artery disease is concerned.  He is walking for exercise and is already starting a better diet than previously.  He is very motivated.  A detailed review of systems today was otherwise stable   HISTORY OF CURRENT ILLNESS: From the original intake note:  "Alan Morrison" Alan Morrison was referred by Dr. [name] for evaluation and treatment of deep vein thrombosis (DVT) of the left deep femoral vein . This originally presented as a possible Baker's cyst as noted at the 04/29/2018 appointment with Dr. Danise Mina, and a STAT Doppler ultrasound was  performed on the same day, showing a clot involving the left proximal femoral vein.  Prior to the DVT, Alan Morrison did not have a change in medication, he had not been on a trip, and he did not experience any local trauma. He states that his left leg was extremely swollen and hard as a rock at the time of presentation.  This had been going on several weeks before he brought it to medical attention.  The patient's subsequent history is as detailed below.   PAST MEDICAL HISTORY: Past Medical History:  Diagnosis Date  . Anemia, iron deficiency, inadequate dietary intake   . 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   by [place]  . CAD (coronary artery disease) 2003   cath - Min Dz, EF 65%, Adm.R/O'd  . Cervical spondylosis without myelopathy   . Chest pain, atypical   . Choroidal nevus 01/22/2011   left, yearly eye exam, no diabetic retinopathy  . Diabetes mellitus type II   . Dyspepsia   . Ex-smoker   . Fatty liver 02/2016   by [place]  . GERD (gastroesophageal reflux disease)   . Headache(784.0)   . Heart murmur    Hx of  . History of MRI of brain and brain stem 09/06  . History of MRSA infection 12/2013   R knee boil  . HLD (hyperlipidemia)   . Hyperuricemia   . Obesity   . Sleep apnea   . Stress-induced cardiomyopathy 09/27/98   WNL, EF 64%  . Urosepsis 01/01-01/05/10   Hospitalization    PAST SURGICAL HISTORY: Past Surgical History:  Procedure Laterality Date  .  COLONOSCOPY  07/2013   1 benign polyp rpt 10 yrs [name])  . CORONARY THROMBECTOMY N/A 11/12/2019   Procedure: Coronary Thrombectomy;  Surgeon: Belva Crome, MD;  Location: [facility];  Service: Cardiovascular;  Laterality: N/A;  . CORONARY/GRAFT ACUTE MI REVASCULARIZATION N/A 11/12/2019   Procedure: Coronary/Graft Acute MI Revascularization;  Surgeon: Belva Crome, MD;  Location: [facility];  Service: Cardiovascular;  Laterality: N/A;  . KNEE ARTHROSCOPY Left 1988  . KNEE SURGERY Right 05/21/03   Right, Dr.  Marlou Sa, med meniscus tear via MRI  . LEFT HEART CATH AND CORONARY ANGIOGRAPHY N/A 04/01/2017   Procedure: LEFT HEART CATH AND CORONARY ANGIOGRAPHY;  Surgeon: Martinique, Peter M, MD;  Location: [facility];  Service: Cardiovascular;  Laterality: N/A;  . LEFT HEART CATH AND CORONARY ANGIOGRAPHY N/A 11/12/2019   Procedure: LEFT HEART CATH AND CORONARY ANGIOGRAPHY;  Surgeon: Belva Crome, MD;  Location: [facility];  Service: Cardiovascular;  Laterality: N/A;  . MYELOGRAM  08/05   bulging disc    FAMILY HISTORY: Family History  Problem Relation Age of Onset  . Heart disease Mother        CHF  . Diabetes Mother   . Heart disease Father        CHF  . Hypertension Father   . Migraines Brother        severe headaches from arsenic in the past from  wood that was treated on his deck  . CAD Brother 15       stent  . Cancer Maternal Uncle        unsure  . Stroke Neg Hx   . Colon cancer Neg Hx    Alan Morrison's father died from heart disease at age 55. Patients' mother died from heart disease at age 22; she had a history of a leg clot at age 65. The patient has 2 brothers and 2 sisters. Patient denies anyone in her family having breast, ovarian, prostate, or pancreatic cancer. Alan Morrison's eldest sister had a myocardial infarction. His youngest brother had a stent placed.    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 His wife, [name], retired from the Audiological scientist in 11/2017. Their children, Anderson Malta and Levada Dy, work at [employer] respectively. Alan Morrison attends the [employer]   ADVANCED DIRECTIVES: Alan Morrison's wife, [name], is automatically his healthcare power of attorney.     HEALTH MAINTENANCE: Social History   Tobacco Use  . Smoking status: Former Smoker    Quit date: 04/20/1974    Years since quitting: 45.6  . Smokeless tobacco: Never Used  . Tobacco comment: quit over 20  years  Vaping Use  . Vaping Use: Never used  Substance Use Topics  . Alcohol use: Yes    Alcohol/week: 3.0 standard drinks    Types: 3 Cans of beer per week    Comment: occasional  . Drug use: No    Colonoscopy:   Bone density:    Allergies  Allergen Reactions  . Victoza [Liraglutide] Nausea Only    Irritation around injection site    Current Outpatient Medications  Medication Sig Dispense Refill  . acetaminophen (TYLENOL) 500 MG tablet Take 1,000 mg by mouth 2 (two) times daily as needed for headache (pain).    [name] aspirin EC 81 MG tablet Take 81 mg by mouth daily with supper. Swallow whole.    . Calcium  Carbonate Antacid (TUMS PO) Take 1 tablet by mouth daily as needed (acid reflux/indigestion).    . Cholecalciferol (VITAMIN D) 2000 units tablet Take 2,000 Units by mouth daily with breakfast.     . clopidogrel (PLAVIX) 75 MG tablet Take 1 tablet (75 mg total) by mouth daily with breakfast. 90 tablet 3  . finasteride (PROSCAR) 5 MG tablet Take 1 tablet (5 mg total) by mouth daily. (Patient taking differently: Take 5 mg by mouth daily with supper. ) 90 tablet 3  . Insulin Glargine (BASAGLAR KWIKPEN) 100 UNIT/ML Inject 0.75 mLs (75 Units total) into the skin at bedtime. 15 mL 0  . Insulin Pen Needle (B-D ULTRAFINE III SHORT PEN) 31G X 8 MM MISC 1 Device by Other route 4 (four) times daily. USE AS DIRECTED DAILY 120 each 11  . losartan (COZAAR) 25 MG tablet Take 1 tablet (25 mg total) by mouth daily. (Patient taking differently: Take 25 mg by mouth daily with breakfast. ) 90 tablet 3  . metFORMIN (GLUCOPHAGE) 1000 MG tablet Take 1,000 mg by mouth daily with breakfast.    . metoprolol tartrate (LOPRESSOR) 25 MG tablet TAKE 0.5 TABLETS (12.5 MG TOTAL) BY MOUTH 2 (TWO) TIMES DAILY. 90 tablet 3  . Multiple Vitamin (MULTIVITAMIN WITH MINERALS) TABS tablet Take 1 tablet by mouth daily with breakfast.     . mupirocin cream (BACTROBAN) 2 % Apply 1 application topically 3 (three) times daily. 30  g 0  . ONETOUCH ULTRA test strip USE TO CHECK SUGARS DAILY  AS DIRECTED 100 strip 3  . probenecid (BENEMID) 500 MG tablet Take 0.5 tablets (250 mg total) by mouth every other day. 45 tablet 3  . rivaroxaban (XARELTO) 10 MG TABS tablet Take 10 mg by mouth daily with breakfast.    . rosuvastatin (CRESTOR) 40 MG tablet Take 1 tablet (40 mg total) by mouth daily with supper. 90 tablet 3  . tamsulosin (FLOMAX) 0.4 MG CAPS capsule Take 1 capsule (0.4 mg total) by mouth daily. (Patient taking differently: Take 0.4 mg by mouth daily after breakfast. ) 90 capsule 3  . vitamin C (ASCORBIC ACID) 500 MG tablet Take 500 mg by mouth daily with supper.      No current facility-administered medications for this visit.     OBJECTIVE: white man in no acute distress  Vitals:   11/20/19 1258  BP: 108/73  Pulse: 74  Temp: 98.2 F (36.8 C)  SpO2: 98%     Body mass index is 31.99 kg/m.   Wt Readings from Last 3 Encounters:  11/20/19 207 lb 4.8 oz (94 kg)  11/14/19 (!) 214 lb 8 oz (97.3 kg)  07/13/19 217 lb 5 oz (98.6 kg)      ECOG FS:1 - Symptomatic but completely ambulatory  Sclerae unicteric, EOMs intact Wearing a mask Lungs no rales or rhonchi Heart regular rate and rhythm Abd soft, nontender, positive bowel sounds MSK no focal spinal tenderness, no upper extremity lymphedema Neuro: nonfocal, well oriented, appropriate affect  LAB RESULTS:  CMP     Component Value Date/Time   NA 138 11/20/2019 1215   NA 138 03/25/2017 1225   K 4.6 11/20/2019 1215   CL 108 11/20/2019 1215   CO2 20 (L) 11/20/2019 1215   GLUCOSE 115 (H) 11/20/2019 1215   BUN 32 (H) 11/20/2019 1215   BUN 29 (H) 03/25/2017 1225   CREATININE 1.37 (H) 11/20/2019 1215   CREATININE 1.27 (H) 11/01/2018 0807   CALCIUM 10.4 (H) 11/20/2019 1215  PROT 7.4 11/20/2019 1215   PROT 7.1 06/21/2017 0804   ALBUMIN 3.6 11/20/2019 1215   ALBUMIN 4.4 06/21/2017 0804   AST 34 11/20/2019 1215   AST 28 11/01/2018 0807   ALT 33  11/20/2019 1215   ALT 31 11/01/2018 0807   ALKPHOS 56 11/20/2019 1215   BILITOT 0.7 11/20/2019 1215   BILITOT 0.4 11/01/2018 0807   GFRNONAA 53 (L) 11/20/2019 1215   GFRNONAA 58 (L) 11/01/2018 0807   GFRAA >60 11/20/2019 1215   GFRAA >60 11/01/2018 0807    No results found for: TOTALPROTELP, ALBUMINELP, A1GS, A2GS, BETS, BETA2SER, GAMS, MSPIKE, SPEI  No results found for: KPAFRELGTCHN, LAMBDASER, [facility]  Lab Results  Component Value Date   WBC 6.6 11/20/2019   NEUTROABS 4.6 11/20/2019   HGB 15.0 11/20/2019   HCT 42.5 11/20/2019   MCV 87.6 11/20/2019   PLT 222 11/20/2019   No results found for: LABCA2  No components found for: EPPIRJ188  No results for input(s): INR in the last 168 hours.  No results found for: LABCA2  Lab Results  Component Value Date   CZY606 23 06/28/2018    No results found for: TKZ601  No results found for: UXN235  No results found for: CA2729  No components found for: HGQUANT  Lab Results  Component Value Date   CEA1 1.11 06/28/2018   /  CEA ([facility])  Date Value Ref Range Status  06/28/2018 1.11 0.00 - 5.00 ng/mL Final    Comment:    (NOTE) This test was performed using Architect's Chemiluminescent Microparticle Immunoassay. Values obtained from different assay methods cannot be used interchangeably. Please note that 5-10% of patients who smoke may see CEA levels up to 6.9 ng/mL. Performed at [facility], [place] [address] [place], [place] [address]      No results found for: AFPTUMOR  No results found for: [facility]  Lab Results  Component Value Date   PSA1 0.4 06/28/2018    No results found for: KPAFRELGTCHN, LAMBDASER, KAPLAMBRATIO (kappa/lambda light chains)  No results found for: HGBA, HGBA2QUANT, HGBFQUANT, HGBSQUAN (Hemoglobinopathy evaluation)   No results found for: LDH  Lab Results  Component Value Date   IRON 74 10/11/2017   IRONPCTSAT 19.2 (L) 10/11/2017   (Iron  and TIBC)  Lab Results  Component Value Date   FERRITIN 97.8 01/26/2019    Urinalysis    Component Value Date/Time   COLORURINE YELLOW 12/10/2013 [place] 12/10/2013 1103   LABSPEC 1.014 12/10/2013 1103   PHURINE 5.0 12/10/2013 1103   GLUCOSEU NEGATIVE 12/10/2013 1103   HGBUR NEGATIVE 12/10/2013 1103   HGBUR negative 07/29/2009 1549   BILIRUBINUR Neg 10/22/2016 [place] 12/10/2013 1103   PROTEINUR Neg 10/22/2016 1713   PROTEINUR NEGATIVE 12/10/2013 1103   UROBILINOGEN 0.2 10/22/2016 1713   UROBILINOGEN 0.2 12/10/2013 1103   NITRITE Neg 10/22/2016 1713   NITRITE NEGATIVE 12/10/2013 1103   LEUKOCYTESUR Negative 10/22/2016 1713     STUDIES:  CARDIAC CATHETERIZATION  Result Date: 11/12/2019  A stent was successfully placed.   Acute inferior ST elevation myocardial infarction with ongoing pain for greater than 9 hours.  Totally occluded proximal to mid RCA treated with 22 x 3.0 Onyx DES postdilated to 3.5 mm with TIMI grade III flow and resolution of symptoms.  The PDA contains segmental mid vessel 50 percent stenosis.  Widely patent, short left main  Widely patent LAD with proximal to mid eccentric 50 percent stenosis  and luminal irregularities beyond.  Widely patent circumflex with luminal irregularities.  LV reveals inferior wall hypokinesis.  EF 55 percent.  LVEDP 16 mmHg. RECOMMENDATIONS:  Aggressive risk factor modification.  If A1c significantly elevated, consider adding SGLT2 therapy.  Lipid panel and if LDL greater than 70, intensify management by adding ezetimibe or PCSK9.  Phase 2 cardiac rehab.   DG Chest Port 1 View  Result Date: 11/12/2019 CLINICAL DATA:  STEMI, history coronary artery disease, type II diabetes mellitus, former smoker, GERD, hypertension EXAM: PORTABLE CHEST 1 VIEW COMPARISON:  Portable exam 1158 hours without priors for comparison. FINDINGS: Normal heart size, mediastinal contours, and pulmonary vascularity.  Lungs clear. No pulmonary infiltrate, pleural effusion or pneumothorax. Scattered endplate spur formation thoracic spine. No acute osseous findings. IMPRESSION: No acute abnormalities. Electronically Signed   By: Lavonia Dana M.D.   On: 11/12/2019 12:19   ECHOCARDIOGRAM COMPLETE  Result Date: 11/13/2019    ECHOCARDIOGRAM REPORT   Patient Name:   Alan Morrison Date of Exam: 11/13/2019 Medical Rec #:  [id number]     Height:       68.0 in Accession #:    [id number]    Weight:       210.0 lb Date of Birth:  [birth year]     BSA:          2.087 m Patient Age:    13 years      BP:           115/65 mmHg Patient Gender: M             HR:           62 bpm. Exam Location:  Inpatient Procedure: 2D Echo and Intracardiac Opacification Agent Indications:    Acute Coronary Syndrome I24.9  History:        Patient has no prior history of Echocardiogram examinations.                 CAD; Risk Factors:Dyslipidemia and Diabetes.  Sonographer:    Mikki Santee RDCS (AE) Referring Phys: [place]  1. Left ventricular ejection fraction, by estimation, is 45 to 50%. The left ventricle has mildly decreased function. The left ventricle demonstrates regional wall motion abnormalities (see scoring diagram/findings for description). There is mild concentric left ventricular hypertrophy. Left ventricular diastolic function could not be evaluated.  2. Right ventricular systolic function is mildly reduced. The right ventricular size is mildly enlarged. There is normal pulmonary artery systolic pressure. The estimated right ventricular systolic pressure is 67.3 mmHg.  3. The mitral valve is grossly normal. Mild mitral valve regurgitation. No evidence of mitral stenosis.  4. The aortic valve is tricuspid. Aortic valve regurgitation is not visualized. No aortic stenosis is present.  5. The inferior vena cava is dilated in size with <50% respiratory variability, suggesting right atrial pressure of 15 mmHg. FINDINGS  Left  Ventricle: Left ventricular ejection fraction, by estimation, is 45 to 50%. The left ventricle has mildly decreased function. The left ventricle demonstrates regional wall motion abnormalities. Definity contrast agent was given IV to delineate the left ventricular endocardial borders. The left ventricular internal cavity size was normal in size. There is mild concentric left ventricular hypertrophy. Left ventricular diastolic function could not be evaluated.  LV Wall Scoring: The entire inferior wall and posterior wall are hypokinetic. Right Ventricle: The right ventricular size is mildly enlarged. No increase in right ventricular wall thickness. Right ventricular systolic function is mildly reduced. There is normal  pulmonary artery systolic pressure. The tricuspid regurgitant velocity  is 1.49 m/s, and with an assumed right atrial pressure of 15 mmHg, the estimated right ventricular systolic pressure is 63.0 mmHg. Left Atrium: Left atrial size was normal in size. Right Atrium: Right atrial size was normal in size. Pericardium: Trivial pericardial effusion is present. Mitral Valve: The mitral valve is grossly normal. Mild mitral valve regurgitation. No evidence of mitral valve stenosis. Tricuspid Valve: The tricuspid valve is grossly normal. Tricuspid valve regurgitation is trivial. No evidence of tricuspid stenosis. Aortic Valve: The aortic valve is tricuspid. Aortic valve regurgitation is not visualized. No aortic stenosis is present. Pulmonic Valve: The pulmonic valve was grossly normal. Pulmonic valve regurgitation is not visualized. No evidence of pulmonic stenosis. Aorta: The aortic root and ascending aorta are structurally normal, with no evidence of dilitation. Venous: The inferior vena cava is dilated in size with less than 50% respiratory variability, suggesting right atrial pressure of 15 mmHg. IAS/Shunts: The atrial septum is grossly normal.  LEFT VENTRICLE PLAX 2D LVIDd:         3.93 cm  Diastology  LVIDs:         3.39 cm  LV e' lateral: 6.08 cm/s LV PW:         1.21 cm  LV e' medial:  3.65 cm/s LV IVS:        1.25 cm LVOT diam:     2.20 cm LV SV:         55 LV SV Index:   27 LVOT Area:     3.80 cm  RIGHT VENTRICLE RV S prime:     9.41 cm/s TAPSE (M-mode): 1.0 cm LEFT ATRIUM             Index       RIGHT ATRIUM           Index LA diam:        3.50 cm 1.68 cm/m  RA Area:     16.50 cm LA Vol (A2C):   53.1 ml 25.45 ml/m RA Volume:   49.40 ml  23.68 ml/m LA Vol (A4C):   24.1 ml 11.55 ml/m LA Biplane Vol: 36.2 ml 17.35 ml/m  AORTIC VALVE LVOT Vmax:   72.50 cm/s LVOT Vmean:  48.500 cm/s LVOT VTI:    0.146 m  AORTA Ao Root diam: 3.50 cm MR Peak grad:    78.1 mmHg   TRICUSPID VALVE MR Mean grad:    49.0 mmHg   TR Peak grad:   8.9 mmHg MR Vmax:         442.00 cm/s TR Vmax:        149.00 cm/s MR Vmean:        327.0 cm/s MR PISA:         0.57 cm    SHUNTS MR PISA Eff ROA: 4 mm       Systemic VTI:  0.15 m MR PISA Radius:  0.30 cm     Systemic Diam: 2.20 cm Eleonore Chiquito MD Electronically signed by Eleonore Chiquito MD Signature Date/Time: 11/13/2019/3:33:48 PM    Final      ELIGIBLE FOR AVAILABLE RESEARCH PROTOCOL: no   ASSESSMENT: 69 y.o. [place], [place] man with a history of left femoral vein thrombosis 04/29/2018 in the setting of metabolic syndrome and sedentary lifestyle but otherwise no obvious provoking event  (1) on rivaroxaban starting January 2020  (a) repeat Doppler ultrasonography 11/18/2018 shows DVT cleared  (b) d-dimer normal 11/01/2018  (c) ongoing lifestyle  issues including overweight, lack of exercise, with better diabetic and cholesterol control  (2) rivaroxaban changed to 10 mg daily 11/19/2018, discontinued August 2021  (3) status post inferior MI July 2021, status post stenting   PLAN: I reviewed the difference between venous and arterial clotting with Alan Morrison and his wife.  They understand that the Xarelto is very good for venous clotting and we certainly do not want him to have  any more of those clots.  However arterial clots are much more dangerous.  They include not only heart attacks but also strokes.  As the experience, Xarelto does not prophylax against arterial clots, since these are reviewed chiefly to platelet concerns and so he needs to be on antiplatelet treatment.  I think Plavix aspirin and Xarelto is a little bit more than he needs so we are stopping the Xarelto at this point.  The fact that his D-dimers have been persistently normal reassures me regarding this  We again reviewed the conditions that would increase the risk of venous clotting, chiefly immobility and inflammation, and of course obesity also is a risk factor.  He is going to be working on that and increasing his activity level for his cardiac problems.  Accordingly at this point I feel comfortable releasing him from follow-up here.  I will obtain 1 more D-dimer in about 3 months which can be taken back to on all the labs he will be getting from his cardiologist's.  Of course I will be glad to see Diane again at any point in the future if and when the need arises but as of now are making no further routine appointments for him with me here  Total encounter time 25 minutes.*     Jermiya Reichl, Virgie Dad, MD  11/20/19 1:35 PM Medical Oncology and Hematology [facility] [place], [place] [address] Tel. [phone]    Fax. [phone]    I, Wilburn Mylar, am acting as scribe for Dr. Virgie Dad. Shellsea Borunda.  I, Lurline Del MD, have reviewed the above documentation for accuracy and completeness, and I agree with the above.   *Total Encounter Time as defined by the Centers for Medicare and Medicaid Services includes, in addition to the face-to-face time of a patient visit (documented in the note above) non-face-to-face time: obtaining and reviewing outside history, ordering and reviewing medications, tests or procedures, care coordination (communications with other  health care professionals or caregivers) and documentation in the medical record.

## 2019-11-20 ENCOUNTER — Inpatient Hospital Stay: Payer: Medicare HMO | Attending: Oncology | Admitting: Oncology

## 2019-11-20 ENCOUNTER — Other Ambulatory Visit: Payer: Self-pay

## 2019-11-20 ENCOUNTER — Inpatient Hospital Stay: Payer: Medicare HMO

## 2019-11-20 VITALS — BP 108/73 | HR 74 | Temp 98.2°F | Ht 67.5 in | Wt 207.3 lb

## 2019-11-20 DIAGNOSIS — D751 Secondary polycythemia: Secondary | ICD-10-CM

## 2019-11-20 DIAGNOSIS — D1771 Benign lipomatous neoplasm of kidney: Secondary | ICD-10-CM

## 2019-11-20 DIAGNOSIS — Z794 Long term (current) use of insulin: Secondary | ICD-10-CM | POA: Insufficient documentation

## 2019-11-20 DIAGNOSIS — E1169 Type 2 diabetes mellitus with other specified complication: Secondary | ICD-10-CM

## 2019-11-20 DIAGNOSIS — E669 Obesity, unspecified: Secondary | ICD-10-CM

## 2019-11-20 DIAGNOSIS — N138 Other obstructive and reflux uropathy: Secondary | ICD-10-CM

## 2019-11-20 DIAGNOSIS — N401 Enlarged prostate with lower urinary tract symptoms: Secondary | ICD-10-CM

## 2019-11-20 DIAGNOSIS — I82412 Acute embolism and thrombosis of left femoral vein: Secondary | ICD-10-CM | POA: Diagnosis not present

## 2019-11-20 DIAGNOSIS — Z833 Family history of diabetes mellitus: Secondary | ICD-10-CM | POA: Insufficient documentation

## 2019-11-20 DIAGNOSIS — E119 Type 2 diabetes mellitus without complications: Secondary | ICD-10-CM | POA: Diagnosis not present

## 2019-11-20 DIAGNOSIS — I119 Hypertensive heart disease without heart failure: Secondary | ICD-10-CM | POA: Insufficient documentation

## 2019-11-20 DIAGNOSIS — E785 Hyperlipidemia, unspecified: Secondary | ICD-10-CM | POA: Insufficient documentation

## 2019-11-20 DIAGNOSIS — I2119 ST elevation (STEMI) myocardial infarction involving other coronary artery of inferior wall: Secondary | ICD-10-CM

## 2019-11-20 DIAGNOSIS — Z86718 Personal history of other venous thrombosis and embolism: Secondary | ICD-10-CM | POA: Insufficient documentation

## 2019-11-20 DIAGNOSIS — Z79899 Other long term (current) drug therapy: Secondary | ICD-10-CM | POA: Diagnosis not present

## 2019-11-20 DIAGNOSIS — I2510 Atherosclerotic heart disease of native coronary artery without angina pectoris: Secondary | ICD-10-CM | POA: Insufficient documentation

## 2019-11-20 DIAGNOSIS — I82413 Acute embolism and thrombosis of femoral vein, bilateral: Secondary | ICD-10-CM

## 2019-11-20 DIAGNOSIS — Z87891 Personal history of nicotine dependence: Secondary | ICD-10-CM | POA: Insufficient documentation

## 2019-11-20 DIAGNOSIS — I252 Old myocardial infarction: Secondary | ICD-10-CM | POA: Insufficient documentation

## 2019-11-20 DIAGNOSIS — D689 Coagulation defect, unspecified: Secondary | ICD-10-CM | POA: Insufficient documentation

## 2019-11-20 DIAGNOSIS — Z7982 Long term (current) use of aspirin: Secondary | ICD-10-CM | POA: Insufficient documentation

## 2019-11-20 DIAGNOSIS — Z8249 Family history of ischemic heart disease and other diseases of the circulatory system: Secondary | ICD-10-CM | POA: Insufficient documentation

## 2019-11-20 DIAGNOSIS — Z7901 Long term (current) use of anticoagulants: Secondary | ICD-10-CM | POA: Insufficient documentation

## 2019-11-20 DIAGNOSIS — K219 Gastro-esophageal reflux disease without esophagitis: Secondary | ICD-10-CM | POA: Diagnosis not present

## 2019-11-20 DIAGNOSIS — G4730 Sleep apnea, unspecified: Secondary | ICD-10-CM | POA: Insufficient documentation

## 2019-11-20 DIAGNOSIS — E66811 Obesity, class 1: Secondary | ICD-10-CM

## 2019-11-20 LAB — CBC WITH DIFFERENTIAL/PLATELET
Abs Immature Granulocytes: 0.01 10*3/uL (ref 0.00–0.07)
Basophils Absolute: 0 10*3/uL (ref 0.0–0.1)
Basophils Relative: 1 %
Eosinophils Absolute: 0.2 10*3/uL (ref 0.0–0.5)
Eosinophils Relative: 3 %
HCT: 42.5 % (ref 39.0–52.0)
Hemoglobin: 15 g/dL (ref 13.0–17.0)
Immature Granulocytes: 0 %
Lymphocytes Relative: 20 %
Lymphs Abs: 1.3 10*3/uL (ref 0.7–4.0)
MCH: 30.9 pg (ref 26.0–34.0)
MCHC: 35.3 g/dL (ref 30.0–36.0)
MCV: 87.6 fL (ref 80.0–100.0)
Monocytes Absolute: 0.4 10*3/uL (ref 0.1–1.0)
Monocytes Relative: 6 %
Neutro Abs: 4.6 10*3/uL (ref 1.7–7.7)
Neutrophils Relative %: 70 %
Platelets: 222 10*3/uL (ref 150–400)
RBC: 4.85 MIL/uL (ref 4.22–5.81)
RDW: 13 % (ref 11.5–15.5)
WBC: 6.6 10*3/uL (ref 4.0–10.5)
nRBC: 0 % (ref 0.0–0.2)

## 2019-11-20 LAB — COMPREHENSIVE METABOLIC PANEL
ALT: 33 U/L (ref 0–44)
AST: 34 U/L (ref 15–41)
Albumin: 3.6 g/dL (ref 3.5–5.0)
Alkaline Phosphatase: 56 U/L (ref 38–126)
Anion gap: 10 (ref 5–15)
BUN: 32 mg/dL — ABNORMAL HIGH (ref 8–23)
CO2: 20 mmol/L — ABNORMAL LOW (ref 22–32)
Calcium: 10.4 mg/dL — ABNORMAL HIGH (ref 8.9–10.3)
Chloride: 108 mmol/L (ref 98–111)
Creatinine, Ser: 1.37 mg/dL — ABNORMAL HIGH (ref 0.61–1.24)
GFR calc Af Amer: >60 mL/min (ref >60)
GFR calc non Af Amer: 53 mL/min — ABNORMAL LOW (ref >60)
Glucose, Bld: 115 mg/dL — ABNORMAL HIGH (ref 70–99)
Potassium: 4.6 mmol/L (ref 3.5–5.1)
Sodium: 138 mmol/L (ref 135–145)
Total Bilirubin: 0.7 mg/dL (ref 0.3–1.2)
Total Protein: 7.4 g/dL (ref 6.5–8.1)

## 2019-11-20 LAB — D-DIMER, QUANTITATIVE: D-Dimer, Quant: 0.46 ug/mL-FEU (ref 0.00–0.50)

## 2019-11-27 ENCOUNTER — Encounter: Payer: Self-pay | Admitting: Physician Assistant

## 2019-11-27 ENCOUNTER — Other Ambulatory Visit: Payer: Self-pay

## 2019-11-27 ENCOUNTER — Ambulatory Visit: Payer: Medicare HMO | Admitting: Physician Assistant

## 2019-11-27 VITALS — BP 100/60 | HR 74 | Ht 67.5 in | Wt 205.6 lb

## 2019-11-27 DIAGNOSIS — E119 Type 2 diabetes mellitus without complications: Secondary | ICD-10-CM | POA: Diagnosis not present

## 2019-11-27 DIAGNOSIS — I82409 Acute embolism and thrombosis of unspecified deep veins of unspecified lower extremity: Secondary | ICD-10-CM

## 2019-11-27 DIAGNOSIS — E785 Hyperlipidemia, unspecified: Secondary | ICD-10-CM | POA: Diagnosis not present

## 2019-11-27 DIAGNOSIS — I10 Essential (primary) hypertension: Secondary | ICD-10-CM

## 2019-11-27 DIAGNOSIS — Z794 Long term (current) use of insulin: Secondary | ICD-10-CM | POA: Diagnosis not present

## 2019-11-27 DIAGNOSIS — I2510 Atherosclerotic heart disease of native coronary artery without angina pectoris: Secondary | ICD-10-CM

## 2019-11-27 NOTE — Progress Notes (Signed)
Cardiology Office Note:    Date:  11/29/2019   ID:  Alan Morrison, DOB 07-19-1950, MRN [id number]  PCP:  [name], MD  [facility] Cardiologist:  Peter Martinique, MD  [place] Electrophysiologist:  None   Referring MD: [name], MD   No chief complaint on file.   History of Present Illness:    Alan Morrison is a 69 y.o. male with a hx of HLD, DM II, stress cardiomyopathy, OSA on CPAP, history of DVT on Xarelto, former smoker, and CAD.  Patient had nonobstructive CAD by angiography in 2018.  More recently, he presented with inferior wall acute STEMI on 11/12/2019.  Initial EKG did identify inferior Q waves with persistent ST elevation in the inferior lead.  Cardiac catheterization performed on 11/12/2019 showed total occlusion of proximal to mid RCA treated with 3.0 x 22 mm Onyx DES postdilated to 3.5 mm, 30% mid PDA vessel lesion, widely patent left main, 50% proximal to mid LAD lesion, EF 55%, LVEDP 16 mmHg.  Postprocedure, patient was placed on triple therapy including aspirin, Plavix and Xarelto.  The plan is to discontinue aspirin after 1 month.  Echocardiogram showed EF 45 to 50%.  During the hospitalization patient also had frequent hypoglycemic spells at home, he was not very compliant with insulin at home which explains the hypoglycemic episodes when he was placed on the full-strength insulin.  Lovenox was stopped and he was discharged on long-acting insulin only with plan to be evaluated by his endocrinologist for further medication adjustment.  He was seen by Dr. Jana Hakim on 11/20/2019 who discontinued the Xarelto.  Patient presents today for cardiology office follow-up. He denies any recent chest pain or shortness of breath. Overall he is doing quite well on the current therapy. Since his Xarelto has been discontinued, I will continue him on the aspirin and the Plavix. He has no lower extremity edema, orthopnea or PND. I have given him a copy of his catheterization  report and echocardiogram. He still has not been seen by his endocrinologist yet, I will defer to his endocrinologist regarding his insulin adjustment. Otherwise he can follow-up with Dr. Almyra Free in 3 months.   Past Medical History:  Diagnosis Date  . Anemia, iron deficiency, inadequate dietary intake   . 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   by [place]  . CAD (coronary artery disease) 2003   cath - Min Dz, EF 65%, Adm.R/O'd  . Cervical spondylosis without myelopathy   . Chest pain, atypical   . Choroidal nevus 01/22/2011   left, yearly eye exam, no diabetic retinopathy  . Diabetes mellitus type II   . Dyspepsia   . Ex-smoker   . Fatty liver 02/2016   by [place]  . GERD (gastroesophageal reflux disease)   . Headache(784.0)   . Heart murmur    Hx of  . History of MRI of brain and brain stem 09/06  . History of MRSA infection 12/2013   R knee boil  . HLD (hyperlipidemia)   . Hyperuricemia   . Obesity   . Sleep apnea   . Stress-induced cardiomyopathy 09/27/98   WNL, EF 64%  . Urosepsis 01/01-01/05/10   Hospitalization    Past Surgical History:  Procedure Laterality Date  . COLONOSCOPY  07/2013   1 benign polyp rpt 10 yrs [name])  . CORONARY THROMBECTOMY N/A 11/12/2019   Procedure: Coronary Thrombectomy;  Surgeon: Belva Crome, MD;  Location: [place] [facility];  Service: Cardiovascular;  Laterality: N/A;  . CORONARY/GRAFT ACUTE  MI REVASCULARIZATION N/A 11/12/2019   Procedure: Coronary/Graft Acute MI Revascularization;  Surgeon: Belva Crome, MD;  Location: [place] [facility];  Service: Cardiovascular;  Laterality: N/A;  . KNEE ARTHROSCOPY Left 1988  . KNEE SURGERY Right 05/21/03   Right, Dr. Marlou Sa, med meniscus tear via MRI  . LEFT HEART CATH AND CORONARY ANGIOGRAPHY N/A 04/01/2017   Procedure: LEFT HEART CATH AND CORONARY ANGIOGRAPHY;  Surgeon: Martinique, Peter M, MD;  Location: [place] [facility];  Service: Cardiovascular;  Laterality: N/A;  . LEFT HEART CATH AND CORONARY  ANGIOGRAPHY N/A 11/12/2019   Procedure: LEFT HEART CATH AND CORONARY ANGIOGRAPHY;  Surgeon: Belva Crome, MD;  Location: [place] [facility];  Service: Cardiovascular;  Laterality: N/A;  . MYELOGRAM  08/05   bulging disc    Current Medications: Current Meds  Medication Sig  . acetaminophen (TYLENOL) 500 MG tablet Take 1,000 mg by mouth 2 (two) times daily as needed for headache (pain).  [name] aspirin EC 81 MG tablet Take 81 mg by mouth daily with supper. Swallow whole.  . Calcium Carbonate Antacid (TUMS PO) Take 1 tablet by mouth daily as needed (acid reflux/indigestion).  . Cholecalciferol (VITAMIN D) 2000 units tablet Take 2,000 Units by mouth daily with breakfast.   . clopidogrel (PLAVIX) 75 MG tablet Take 1 tablet (75 mg total) by mouth daily with breakfast.  . finasteride (PROSCAR) 5 MG tablet Take 1 tablet (5 mg total) by mouth daily.  . Insulin Glargine (BASAGLAR KWIKPEN) 100 UNIT/ML Inject 0.75 mLs (75 Units total) into the skin at bedtime.  . Insulin Pen Needle (B-D ULTRAFINE III SHORT PEN) 31G X 8 MM MISC 1 Device by Other route 4 (four) times daily. USE AS DIRECTED DAILY  . losartan (COZAAR) 25 MG tablet Take 1 tablet (25 mg total) by mouth daily.  . metFORMIN (GLUCOPHAGE) 1000 MG tablet Take 1,000 mg by mouth daily with breakfast.  . metoprolol tartrate (LOPRESSOR) 25 MG tablet TAKE 0.5 TABLETS (12.5 MG TOTAL) BY MOUTH 2 (TWO) TIMES DAILY.  . Multiple Vitamin (MULTIVITAMIN WITH MINERALS) TABS tablet Take 1 tablet by mouth daily with breakfast.   . mupirocin cream (BACTROBAN) 2 % Apply 1 application topically 3 (three) times daily.  Glory Rosebush ULTRA test strip USE TO CHECK SUGARS DAILY  AS DIRECTED  . probenecid (BENEMID) 500 MG tablet Take 0.5 tablets (250 mg total) by mouth every other day.  . rosuvastatin (CRESTOR) 40 MG tablet Take 1 tablet (40 mg total) by mouth daily with supper.  . tamsulosin (FLOMAX) 0.4 MG CAPS capsule Take 1 capsule (0.4 mg total) by mouth daily.  . vitamin  C (ASCORBIC ACID) 500 MG tablet Take 500 mg by mouth daily with supper.      Allergies:   Victoza [liraglutide]   Social History   Socioeconomic History  . Marital status: Married    Spouse name: Not on file  . Number of children: 2  . Years of education: Not on file  . Highest education level: Not on file  Occupational History  . Occupation: Maintenance    Employer: [employer]  Tobacco Use  . Smoking status: Former Smoker    Quit date: 04/20/1974    Years since quitting: 45.6  . Smokeless tobacco: Never Used  . Tobacco comment: quit over 20 years  Vaping Use  . Vaping Use: Never used  Substance and Sexual Activity  . Alcohol use: Yes    Alcohol/week: 3.0 standard drinks  Types: 3 Cans of beer per week    Comment: occasional  . Drug use: No  . Sexual activity: Not on file  Other Topics Concern  . Not on file  Social History Narrative   Lives with wife   Occupation: equipment maintenance   Activity: no regular exercise - hunts   Diet: good water, fruits/vegetables   Social Determinants of Health   Financial Resource Strain:   . Difficulty of Paying Living Expenses:   Food Insecurity:   . Worried About Charity fundraiser in the Last Year:   . Arboriculturist in the Last Year:   Transportation Needs:   . Film/video editor (Medical):   [name] Lack of Transportation (Non-Medical):   Physical Activity:   . Days of Exercise per Week:   . Minutes of Exercise per Session:   Stress:   . Feeling of Stress :   Social Connections:   . Frequency of Communication with Friends and Family:   . Frequency of Social Gatherings with Friends and Family:   . Attends Religious Services:   . Active Member of Clubs or Organizations:   . Attends Archivist Meetings:   [name] Marital Status:      Family History: The patient's family history includes CAD (age of onset: 27) in his brother; Cancer in his maternal uncle; Diabetes in his mother; Heart disease in his  father and mother; Hypertension in his father; Migraines in his brother. There is no history of Stroke or Colon cancer.  ROS:   Please see the history of present illness.     All other systems reviewed and are negative.  EKGs/Labs/Other Studies Reviewed:    The following studies were reviewed today:  Cath 11/12/2019  A stent was successfully placed.    Acute inferior ST elevation myocardial infarction with ongoing pain for greater than 9 hours.  Totally occluded proximal to mid RCA treated with 22 x 3.0 Onyx DES postdilated to 3.5 mm with TIMI grade III flow and resolution of symptoms.  The PDA contains segmental mid vessel 50 percent stenosis.  Widely patent, short left main  Widely patent LAD with proximal to mid eccentric 50 percent stenosis and luminal irregularities beyond.  Widely patent circumflex with luminal irregularities.  LV reveals inferior wall hypokinesis.  EF 55 percent.  LVEDP 16 mmHg.  RECOMMENDATIONS:   Aggressive risk factor modification.  If A1c significantly elevated, consider adding SGLT2 therapy.  Lipid panel and if LDL greater than 70, intensify management by adding ezetimibe or PCSK9.  Phase 2 cardiac rehab.   EKG:  EKG is ordered today.  The ekg ordered today demonstrates normal sinus rhythm, Q waves in the inferior leads, T wave inversion in inferior lateral leads.  Recent Labs: 11/12/2019: B Natriuretic Peptide 311.4 11/20/2019: ALT 33; BUN 32; Creatinine, Ser 1.37; Hemoglobin 15.0; Platelets 222; Potassium 4.6; Sodium 138  Recent Lipid Panel    Component Value Date/Time   CHOL 94 11/12/2019 1143   CHOL 112 11/01/2018 0000   TRIG 60 11/12/2019 1143   HDL 35 (L) 11/12/2019 1143   HDL 37 (L) 11/01/2018 0000   CHOLHDL 2.7 11/12/2019 1143   VLDL 12 11/12/2019 1143   LDLCALC 47 11/12/2019 1143   LDLCALC 59 11/01/2018 0000    Physical Exam:    VS:  BP 100/60   Pulse 74   Ht 5' 7.5" (1.715 m)   Wt 205 lb 9.6 oz (93.3 kg)   SpO2 96%  BMI 31.73 kg/m     Wt Readings from Last 3 Encounters:  11/27/19 205 lb 9.6 oz (93.3 kg)  11/20/19 207 lb 4.8 oz (94 kg)  11/14/19 (!) 214 lb 8 oz (97.3 kg)     GEN:  Well nourished, well developed in no acute distress HEENT: Normal NECK: No JVD; No carotid bruits LYMPHATICS: No lymphadenopathy CARDIAC: RRR, no murmurs, rubs, gallops RESPIRATORY:  Clear to auscultation without rales, wheezing or rhonchi  ABDOMEN: Soft, non-tender, non-distended MUSCULOSKELETAL:  No edema; No deformity  SKIN: Warm and dry NEUROLOGIC:  Alert and oriented x 3 PSYCHIATRIC:  Normal affect   ASSESSMENT:    1. Coronary artery disease involving native coronary artery of native heart without angina pectoris   2. Essential hypertension, benign   3. Hyperlipidemia LDL goal <70   4. Controlled type 2 diabetes mellitus without complication, with long-term current use of insulin ([place])   5. Deep vein thrombosis (DVT) of lower extremity, unspecified chronicity, unspecified laterality, unspecified vein (HCC)    PLAN:    In order of problems listed above:  1. CAD: Recently underwent cardiac catheterization for inferior STEMI and underwent DES to RCA.  Original plan is to continue aspirin, Plavix and Xarelto for 1 month then discontinue aspirin thereafter.  However his Xarelto has been discontinued by his hematologist, therefore I think it is okay for him to continue on the aspirin and the Plavix at this point.  2. Hypertension: Blood pressure stable  3. Hyperlipidemia: Continue statin therapy  4. DM2: Managed by his endocrinologist.  We discontinued his short acting insulin and only left him on the long-acting insulin due to hypoglycemic episode in the hospital.  Short acting insulin will need to be managed by the endocrinologist.  He admitted in the hospital he may not be fully compliant with insulin before, this likely resulted in the hypoglycemic episodes, therefore we were hesitant to discharge him on the  previous dose of short acting insulin.  5. History of DVT: He only had one episode of DVT and has completed over 1 year of Xarelto.  Xarelto has been discontinued by Dr. Jana Hakim.   Medication Adjustments/Labs and Tests Ordered: Current medicines are reviewed at length with the patient today.  Concerns regarding medicines are outlined above.  Orders Placed This Encounter  Procedures  . EKG 12-Lead   No orders of the defined types were placed in this encounter.   Patient Instructions  Medication Instructions:  The current medical regimen is effective;  continue present plan and medications as directed. Please refer to the Current Medication list given to you today. *If you need a refill on your cardiac medications before your next appointment, please call your pharmacy*  Follow-Up: Your next appointment:  3 month(s) In Person with Peter Martinique, MD  At [facility], you and your health needs are our priority.  As part of our continuing mission to provide you with exceptional heart care, we have created designated Provider Care Teams.  These Care Teams include your primary Cardiologist (physician) and Advanced Practice Providers (APPs -  Physician Assistants and Nurse Practitioners) who all work together to provide you with the care you need, when you need it.     Hilbert Corrigan, [place]  11/29/2019 11:56 PM    [facility]

## 2019-11-27 NOTE — Patient Instructions (Signed)
Medication Instructions:  The current medical regimen is effective;  continue present plan and medications as directed. Please refer to the Current Medication list given to you today. *If you need a refill on your cardiac medications before your next appointment, please call your pharmacy*  Follow-Up: Your next appointment:  3 month(s) In Person with Peter Martinique, MD  At [facility], you and your health needs are our priority.  As part of our continuing mission to provide you with exceptional heart care, we have created designated Provider Care Teams.  These Care Teams include your primary Cardiologist (physician) and Advanced Practice Providers (APPs -  Physician Assistants and Nurse Practitioners) who all work together to provide you with the care you need, when you need it.

## 2019-11-30 ENCOUNTER — Ambulatory Visit (INDEPENDENT_AMBULATORY_CARE_PROVIDER_SITE_OTHER): Payer: Medicare HMO | Admitting: Family Medicine

## 2019-11-30 ENCOUNTER — Encounter: Payer: Self-pay | Admitting: Family Medicine

## 2019-11-30 ENCOUNTER — Other Ambulatory Visit: Payer: Self-pay

## 2019-11-30 VITALS — BP 118/72 | HR 70 | Temp 97.6°F | Ht 67.5 in | Wt 203.0 lb

## 2019-11-30 DIAGNOSIS — E785 Hyperlipidemia, unspecified: Secondary | ICD-10-CM | POA: Diagnosis not present

## 2019-11-30 DIAGNOSIS — E1142 Type 2 diabetes mellitus with diabetic polyneuropathy: Secondary | ICD-10-CM | POA: Diagnosis not present

## 2019-11-30 DIAGNOSIS — E1169 Type 2 diabetes mellitus with other specified complication: Secondary | ICD-10-CM | POA: Diagnosis not present

## 2019-11-30 DIAGNOSIS — I10 Essential (primary) hypertension: Secondary | ICD-10-CM

## 2019-11-30 DIAGNOSIS — I2510 Atherosclerotic heart disease of native coronary artery without angina pectoris: Secondary | ICD-10-CM

## 2019-11-30 DIAGNOSIS — E1165 Type 2 diabetes mellitus with hyperglycemia: Secondary | ICD-10-CM | POA: Diagnosis not present

## 2019-11-30 DIAGNOSIS — Z794 Long term (current) use of insulin: Secondary | ICD-10-CM | POA: Diagnosis not present

## 2019-11-30 DIAGNOSIS — I82412 Acute embolism and thrombosis of left femoral vein: Secondary | ICD-10-CM

## 2019-11-30 DIAGNOSIS — E782 Mixed hyperlipidemia: Secondary | ICD-10-CM | POA: Diagnosis not present

## 2019-11-30 NOTE — Progress Notes (Signed)
This visit was conducted in person.  BP 118/72   Pulse 70   Temp 97.6 F (36.4 C)   Ht 5' 7.5" (1.715 m)   Wt 203 lb (92.1 kg)   SpO2 97%   BMI 31.33 kg/m    CC: 68mo f/u visit  Subjective:    Patient ID: Alan Morrison, male    DOB: [birth year], 69 y.o.   MRN: [id number]  HPI: [name] is a 69 y.o. male presenting on 11/30/2019 for Follow-up   Recent diagnosis inferior wall acute STEMI 10/2019 treated with DES now on aspirin and plavix. MI presented as substernal chest pressure. EF at that time showed EF 45-50%. Plan DAPT x 1 year. Saw cards in f/u this week, planned f/u with Dr Martinique in 02/2020. He's been walking more regular since hospitalization - 15 min twice daily. Discussing starting cardiac rehab. Rare tums.   H/o L femoral DVT 04/2018 s/p xarelto treatment through 11/2019.   DM - good control planning to establish with new endocrinologist. Dismissed by Dr Loanne Drilling - unclear why but it seems he had missed appt. Currently taking basaglar 75u daily [he is taking 40 units]. Novolog was stopped in the hospital. He is on metformin 1000mg  daily. To see [facility] End[facility] this afternoon. Better diet has led to weight loss and lower sugars. Checking sugars at least twice daily - ranging 70-120s.  Lab Results  Component Value Date   HGBA1C 6.3 (H) 11/13/2019        Relevant past medical, surgical, family and social history reviewed and updated as indicated. Interim medical history since our last visit reviewed. Allergies and medications reviewed and updated. Outpatient Medications Prior to Visit  Medication Sig Dispense Refill  . acetaminophen (TYLENOL) 500 MG tablet Take 1,000 mg by mouth 2 (two) times daily as needed for headache (pain).    [name] aspirin EC 81 MG tablet Take 81 mg by mouth daily with supper. Swallow whole.    . Calcium Carbonate Antacid (TUMS PO) Take 1 tablet by mouth daily as needed (acid reflux/indigestion).    . Cholecalciferol (VITAMIN D) 2000 units tablet Take  2,000 Units by mouth daily with breakfast.     . clopidogrel (PLAVIX) 75 MG tablet Take 1 tablet (75 mg total) by mouth daily with breakfast. 90 tablet 3  . finasteride (PROSCAR) 5 MG tablet Take 1 tablet (5 mg total) by mouth daily. 90 tablet 3  . Insulin Glargine (BASAGLAR KWIKPEN) 100 UNIT/ML Inject 0.4 mLs (40 Units total) into the skin at bedtime.    . Insulin Pen Needle (B-D ULTRAFINE III SHORT PEN) 31G X 8 MM MISC 1 Device by Other route 4 (four) times daily. USE AS DIRECTED DAILY 120 each 11  . losartan (COZAAR) 25 MG tablet Take 1 tablet (25 mg total) by mouth daily. 90 tablet 3  . metFORMIN (GLUCOPHAGE) 1000 MG tablet Take 1,000 mg by mouth daily with breakfast.    . Multiple Vitamin (MULTIVITAMIN WITH MINERALS) TABS tablet Take 1 tablet by mouth daily with breakfast.     . mupirocin cream (BACTROBAN) 2 % Apply 1 application topically 3 (three) times daily. 30 g 0  . ONETOUCH ULTRA test strip USE TO CHECK SUGARS DAILY  AS DIRECTED 100 strip 3  . probenecid (BENEMID) 500 MG tablet Take 0.5 tablets (250 mg total) by mouth every other day. 45 tablet 3  . rosuvastatin (CRESTOR) 40 MG tablet Take 1 tablet (40 mg total) by mouth daily with supper. 90 tablet 3  .  tamsulosin (FLOMAX) 0.4 MG CAPS capsule Take 1 capsule (0.4 mg total) by mouth daily. 90 capsule 3  . vitamin C (ASCORBIC ACID) 500 MG tablet Take 500 mg by mouth daily with supper.     . Insulin Glargine (BASAGLAR KWIKPEN) 100 UNIT/ML Inject 0.75 mLs (75 Units total) into the skin at bedtime. 15 mL 0  . metoprolol tartrate (LOPRESSOR) 25 MG tablet TAKE 0.5 TABLETS (12.5 MG TOTAL) BY MOUTH 2 (TWO) TIMES DAILY. 90 tablet 3   No facility-administered medications prior to visit.     Per HPI unless specifically indicated in ROS section below Review of Systems Objective:  BP 118/72   Pulse 70   Temp 97.6 F (36.4 C)   Ht 5' 7.5" (1.715 m)   Wt 203 lb (92.1 kg)   SpO2 97%   BMI 31.33 kg/m   Wt Readings from Last 3 Encounters:    11/30/19 203 lb (92.1 kg)  11/27/19 205 lb 9.6 oz (93.3 kg)  11/20/19 207 lb 4.8 oz (94 kg)      Physical Exam Vitals and nursing note reviewed.  Constitutional:      General: He is not in acute distress.    Appearance: He is well-developed.  HENT:     Head: Normocephalic and atraumatic.     Right Ear: External ear normal.     Left Ear: External ear normal.     Nose: Nose normal.     Mouth/Throat:     Pharynx: No oropharyngeal exudate.  Eyes:     General: No scleral icterus.    Conjunctiva/sclera: Conjunctivae normal.     Pupils: Pupils are equal, round, and reactive to light.  Cardiovascular:     Rate and Rhythm: Normal rate and regular rhythm.     Heart sounds: Normal heart sounds. No murmur heard.   Pulmonary:     Effort: Pulmonary effort is normal. No respiratory distress.     Breath sounds: Normal breath sounds. No wheezing or rales.  Musculoskeletal:     Cervical back: Normal range of motion and neck supple.     Comments: See HPI for foot exam if done  Lymphadenopathy:     Cervical: No cervical adenopathy.  Skin:    General: Skin is warm and dry.     Findings: No rash.       Results for orders placed or performed in visit on 11/20/19  D-dimer, quantitative (not at [facility])  Result Value Ref Range   D-Dimer, Quant 0.46 0.00 - 0.50 ug/mL-FEU  Comprehensive metabolic panel  Result Value Ref Range   Sodium 138 135 - 145 mmol/L   Potassium 4.6 3.5 - 5.1 mmol/L   Chloride 108 98 - 111 mmol/L   CO2 20 (L) 22 - 32 mmol/L   Glucose, Bld 115 (H) 70 - 99 mg/dL   BUN 32 (H) 8 - 23 mg/dL   Creatinine, Ser 1.37 (H) 0.61 - 1.24 mg/dL   Calcium 10.4 (H) 8.9 - 10.3 mg/dL   Total Protein 7.4 6.5 - 8.1 g/dL   Albumin 3.6 3.5 - 5.0 g/dL   AST 34 15 - 41 U/L   ALT 33 0 - 44 U/L   Alkaline Phosphatase 56 38 - 126 U/L   Total Bilirubin 0.7 0.3 - 1.2 mg/dL   GFR calc non Af Amer 53 (L) >60 mL/min   GFR calc Af Amer >60 >60 mL/min   Anion gap 10 5 - 15  CBC with  Differential/Platelet  Result Value Ref  Range   WBC 6.6 4.0 - 10.5 K/uL   RBC 4.85 4.22 - 5.81 MIL/uL   Hemoglobin 15.0 13.0 - 17.0 g/dL   HCT 42.5 39 - 52 %   MCV 87.6 80.0 - 100.0 fL   MCH 30.9 26.0 - 34.0 pg   MCHC 35.3 30.0 - 36.0 g/dL   RDW 13.0 11.5 - 15.5 %   Platelets 222 150 - 400 K/uL   nRBC 0.0 0.0 - 0.2 %   Neutrophils Relative % 70 %   Neutro Abs 4.6 1.7 - 7.7 K/uL   Lymphocytes Relative 20 %   Lymphs Abs 1.3 0.7 - 4.0 K/uL   Monocytes Relative 6 %   Monocytes Absolute 0.4 0 - 1 K/uL   Eosinophils Relative 3 %   Eosinophils Absolute 0.2 0 - 0 K/uL   Basophils Relative 1 %   Basophils Absolute 0.0 0 - 0 K/uL   Immature Granulocytes 0 %   Abs Immature Granulocytes 0.01 0.00 - 0.07 K/uL   Lab Results  Component Value Date   CHOL 94 11/12/2019   HDL 35 (L) 11/12/2019   LDLCALC 47 11/12/2019   TRIG 60 11/12/2019   CHOLHDL 2.7 11/12/2019    Assessment & Plan:  This visit occurred during the SARS-CoV-2 public health emergency.  Safety protocols were in place, including screening questions prior to the visit, additional usage of staff PPE, and extensive cleaning of exam room while observing appropriate contact time as indicated for disinfecting solutions.   Problem List Items Addressed This Visit    Type 2 diabetes, controlled, with peripheral neuropathy ([place]) - Primary    Chronic, good control based on latest A1c. He has been tapering his basaglar down to 40mg  daily due to low sugars. novolog was stopped while hospitalized. He had been implementing healthy diet changes for several months prior to MI, with concern over intermittent hypoglycemia. He has appt planned to establish with new endo later today at [facility] clinic.  We have requested latest eye exam (completed last month).       Relevant Medications   Insulin Glargine (BASAGLAR KWIKPEN) 100 UNIT/ML   Hyperlipidemia associated with type 2 diabetes mellitus ([place])    Great control on crestor 40mg  daily with LDL  at goal - continue. The ASCVD Risk score Mikey Bussing DC Jr., et al., 2013) failed to calculate for the following reasons:   The patient has a prior MI or stroke diagnosis       Relevant Medications   Insulin Glargine (BASAGLAR KWIKPEN) 100 UNIT/ML   Essential hypertension, benign    Chronic, recently lower readings.  Continue metoprolol and ARB       DVT of deep femoral vein, left (HCC)    Saw Dr Jana Hakim, xarelto was discontinued after 1+ year of treatment (DVT 04/2018).       CAD (coronary artery disease), native coronary artery    S/p recent STEMI last month, now on aspirin, plavix, crestor. Appreciate cardiology care.           No orders of the defined types were placed in this encounter.  No orders of the defined types were placed in this encounter.   Patient Instructions  We will request records from [facility] in [place] ([place]) Good to see you today. Return as needed or in 6 months for physical.    Follow up plan: Return in about 6 months (around 06/01/2020) for medicare wellness visit, annual exam, prior fasting for blood work.  [name],  MD

## 2019-11-30 NOTE — Assessment & Plan Note (Addendum)
Great control on crestor 40mg  daily with LDL at goal - continue. The ASCVD Risk score Mikey Bussing DC Jr., et al., 2013) failed to calculate for the following reasons:   The patient has a prior MI or stroke diagnosis

## 2019-11-30 NOTE — Assessment & Plan Note (Deleted)
S/p recent STEMI last month, now on aspirin, plavix, crestor. Appreciate cardiology care.

## 2019-11-30 NOTE — Assessment & Plan Note (Signed)
S/p recent STEMI last month, now on aspirin, plavix, crestor. Appreciate cardiology care.

## 2019-11-30 NOTE — Assessment & Plan Note (Addendum)
Chronic, good control based on latest A1c. He has been tapering his basaglar down to 40mg  daily due to low sugars. novolog was stopped while hospitalized. He had been implementing healthy diet changes for several months prior to MI, with concern over intermittent hypoglycemia. He has appt planned to establish with new endo later today at [facility] clinic.  We have requested latest eye exam (completed last month).

## 2019-11-30 NOTE — Assessment & Plan Note (Signed)
Chronic, recently lower readings.  Continue metoprolol and ARB

## 2019-11-30 NOTE — Assessment & Plan Note (Signed)
Saw Dr Jana Hakim, xarelto was discontinued after 1+ year of treatment (DVT 04/2018).

## 2019-11-30 NOTE — Patient Instructions (Addendum)
We will request records from [facility] in [place] ([place] [place]ter) Good to see you today. Return as needed or in 6 months for physical.

## 2019-12-01 ENCOUNTER — Encounter: Payer: Self-pay | Admitting: Family Medicine

## 2019-12-06 ENCOUNTER — Telehealth (HOSPITAL_COMMUNITY): Payer: Self-pay

## 2019-12-06 NOTE — Telephone Encounter (Signed)
Called patient to see if he was interested in participating in the Cardiac Rehab Program. Patient stated yes. Patient will come in for orientation on 12/28/19 @ 930AM and will attend the 145PM exercise class.  Mailed letter

## 2019-12-08 DIAGNOSIS — R69 Illness, unspecified: Secondary | ICD-10-CM | POA: Diagnosis not present

## 2019-12-13 ENCOUNTER — Encounter: Payer: Self-pay | Admitting: Family Medicine

## 2019-12-26 ENCOUNTER — Telehealth (HOSPITAL_COMMUNITY): Payer: Self-pay | Admitting: Pharmacy Technician

## 2019-12-26 NOTE — Telephone Encounter (Signed)
Cardiac Rehab Medication Review by a Pharmacist  Does the patient  feel that his/her medications are working for him/her?  yes  Has the patient been experiencing any side effects to the medications prescribed?  no  Does the patient measure his/her own blood pressure or blood glucose at home?  Yes, on average BP is in the 110's/60's, patient denies any dizziness or lightheadedness Yes, on average BG is 95-120 in the morning and in the evening before bed, patient denies any episodes of low blood sugar or feels of shaking, sweating, or dizziness  Does the patient have any problems obtaining medications due to transportation or finances?   no  Understanding of regimen: good Understanding of indications: good Potential of compliance: good   Pharmacist Intervention: N/A  Patient is taking all medications as prescribed. No issues noted.   Alan Morrison, PharmD PGY1 Acute Care Pharmacy Resident Phone: [phone] 12/26/2019 3:45 PM  Please check [email or web address] for unit specific pharmacy phone numbers.

## 2019-12-28 ENCOUNTER — Encounter (HOSPITAL_COMMUNITY): Payer: Self-pay

## 2019-12-28 ENCOUNTER — Encounter (HOSPITAL_COMMUNITY)
Admission: RE | Admit: 2019-12-28 | Discharge: 2019-12-28 | Disposition: A | Discharge status: Home / Self Care | Payer: Medicare HMO | Source: Ambulatory Visit | Attending: Cardiology | Admitting: Cardiology

## 2019-12-28 ENCOUNTER — Other Ambulatory Visit: Payer: Self-pay

## 2019-12-28 VITALS — BP 104/72 | HR 67 | Ht 67.5 in | Wt 197.8 lb

## 2019-12-28 DIAGNOSIS — Z955 Presence of coronary angioplasty implant and graft: Secondary | ICD-10-CM

## 2019-12-28 DIAGNOSIS — I2111 ST elevation (STEMI) myocardial infarction involving right coronary artery: Secondary | ICD-10-CM

## 2019-12-28 NOTE — Progress Notes (Signed)
Cardiac Individual Treatment Plan  Patient Details  Name: Alan Morrison MRN: [id number] Date of Birth: May 22, 1950 Referring Provider:     CARDIAC REHAB PHASE II ORIENTATION from 12/28/2019 in [place]  Referring Provider Martinique, Peter MD      Initial Encounter Date:    CARDIAC REHAB PHASE II ORIENTATION from 12/28/2019 in [place]  Date 12/28/19      Visit Diagnosis: ST elevation myocardial infarction involving right coronary artery ([place]) 11/12/19  S/P DES RCA 11/12/19  Patient's Home Medications on Admission:  Current Outpatient Medications:  .  acetaminophen (TYLENOL) 500 MG tablet, Take 1,000 mg by mouth 2 (two) times daily as needed for headache (pain)., Disp: , Rfl:  .  aspirin EC 81 MG tablet, Take 81 mg by mouth daily with supper. Swallow whole., Disp: , Rfl:  .  Calcium Carbonate Antacid (TUMS PO), Take 1 tablet by mouth daily as needed (acid reflux/indigestion)., Disp: , Rfl:  .  Cholecalciferol (VITAMIN D) 2000 units tablet, Take 2,000 Units by mouth daily with breakfast. , Disp: , Rfl:  .  clopidogrel (PLAVIX) 75 MG tablet, Take 1 tablet (75 mg total) by mouth daily with breakfast., Disp: 90 tablet, Rfl: 3 .  finasteride (PROSCAR) 5 MG tablet, Take 1 tablet (5 mg total) by mouth daily., Disp: 90 tablet, Rfl: 3 .  Insulin Glargine (BASAGLAR KWIKPEN) 100 UNIT/ML, Inject 0.4 mLs (40 Units total) into the skin at bedtime., Disp: , Rfl:  .  Insulin Pen Needle (B-D ULTRAFINE III SHORT PEN) 31G X 8 MM MISC, 1 Device by Other route 4 (four) times daily. USE AS DIRECTED DAILY, Disp: 120 each, Rfl: 11 .  losartan (COZAAR) 25 MG tablet, Take 1 tablet (25 mg total) by mouth daily., Disp: 90 tablet, Rfl: 3 .  metFORMIN (GLUCOPHAGE) 1000 MG tablet, Take 1,000 mg by mouth daily with breakfast., Disp: , Rfl:  .  metoprolol tartrate (LOPRESSOR) 25 MG tablet, TAKE 0.5 TABLETS (12.5 MG TOTAL) BY MOUTH 2 (TWO) TIMES DAILY., Disp:  90 tablet, Rfl: 3 .  Multiple Vitamin (MULTIVITAMIN WITH MINERALS) TABS tablet, Take 1 tablet by mouth daily with breakfast. , Disp: , Rfl:  .  mupirocin cream (BACTROBAN) 2 %, Apply 1 application topically 3 (three) times daily. (Patient not taking: Reported on 12/26/2019), Disp: 30 g, Rfl: 0 .  ONETOUCH ULTRA test strip, USE TO CHECK SUGARS DAILY  AS DIRECTED, Disp: 100 strip, Rfl: 3 .  probenecid (BENEMID) 500 MG tablet, Take 0.5 tablets (250 mg total) by mouth every other day., Disp: 45 tablet, Rfl: 3 .  rosuvastatin (CRESTOR) 40 MG tablet, Take 1 tablet (40 mg total) by mouth daily with supper., Disp: 90 tablet, Rfl: 3 .  tamsulosin (FLOMAX) 0.4 MG CAPS capsule, Take 1 capsule (0.4 mg total) by mouth daily., Disp: 90 capsule, Rfl: 3 .  vitamin C (ASCORBIC ACID) 500 MG tablet, Take 500 mg by mouth daily with supper. , Disp: , Rfl:   Past Medical History: Past Medical History:  Diagnosis Date  . Anemia, iron deficiency, inadequate dietary intake   . 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   by [place]  . CAD (coronary artery disease) 2003   cath - Min Dz, EF 65%, Adm.R/O'd  . Cervical spondylosis without myelopathy   . Chest pain, atypical   . Choroidal nevus 01/22/2011   left, yearly eye exam, no diabetic retinopathy  . Diabetes mellitus type II   . Dyspepsia   .  Ex-smoker   . Fatty liver 02/2016   by [place]  . GERD (gastroesophageal reflux disease)   . Headache(784.0)   . Heart murmur    Hx of  . History of MRI of brain and brain stem 09/06  . History of MRSA infection 12/2013   R knee boil  . HLD (hyperlipidemia)   . Hyperuricemia   . Obesity   . Sleep apnea   . Stress-induced cardiomyopathy 09/27/98   WNL, EF 64%  . Urosepsis 01/01-01/05/10   Hospitalization    Tobacco Use: Social History   Tobacco Use  Smoking Status Former Smoker  . Quit date: 04/20/1974  . Years since quitting: 45.7  Smokeless Tobacco Never Used  Tobacco Comment   quit over 20 years    Labs: Recent  Review Flowsheet Data    Labs for ITP Cardiac and Pulmonary Rehab Latest Ref Rng & Units 01/26/2019 05/26/2019 06/19/2019 11/12/2019 11/13/2019   Cholestrol 0 - 200 mg/dL 94 92 - 94 -   LDLCALC 0 - 99 mg/dL 47 42 - 47 -   HDL >40 mg/dL 29.90(L) 28.30(L) - 35(L) -   Trlycerides <150 mg/dL 85.0 112.0 - 60 -   Hemoglobin A1c 4.8 - 5.6 % 7.2(H) 10.4(H) 8.8(A) 6.3(H) 6.3(H)   TCO2 0 - 100 mmol/L - - - - -      Capillary Blood Glucose: Lab Results  Component Value Date   GLUCAP 121 (H) 11/14/2019   GLUCAP 87 11/14/2019   GLUCAP 169 (H) 11/13/2019   GLUCAP 144 (H) 11/13/2019   GLUCAP 146 (H) 11/13/2019     Exercise Target Goals: Exercise Program Goal: Individual exercise prescription set using results from initial 6 min walk test and THRR while considering  patient's activity barriers and safety.   Exercise Prescription Goal: Starting with aerobic activity 30 plus minutes a day, 3 days per week for initial exercise prescription. Provide home exercise prescription and guidelines that participant acknowledges understanding prior to discharge.  Activity Barriers & Risk Stratification:  Activity Barriers & Cardiac Risk Stratification - 12/28/19 1153      Activity Barriers & Cardiac Risk Stratification   Activity Barriers Joint Problems    Cardiac Risk Stratification High           6 Minute Walk:  6 Minute Walk    Row Name 12/28/19 1144         6 Minute Walk   Phase Initial     Distance 1491 feet     Walk Time 6 minutes     # of Rest Breaks 0     MPH 2.8     METS 3.02     RPE 12     Perceived Dyspnea  0     VO2 Peak 10.6     Symptoms No     Resting HR 67 bpm     Resting BP 104/72     Resting Oxygen Saturation  97 %     Exercise Oxygen Saturation  during 6 min walk 97 %     Max Ex. HR 92 bpm     Max Ex. BP 120/80     2 Minute Post BP 106/70            Oxygen Initial Assessment:   Oxygen Re-Evaluation:   Oxygen Discharge (Final Oxygen Re-Evaluation):   Initial  Exercise Prescription:  Initial Exercise Prescription - 12/28/19 1200      Date of Initial Exercise RX and Referring Provider   Date 12/28/19  Referring Provider Martinique, Peter MD    Expected Discharge Date 02/23/20      Recumbant Bike   Level 1.5    Watts 15    Minutes 15    METs 2.5      NuStep   Level 2    SPM 80    Minutes 15    METs 2.5      Prescription Details   Frequency (times per week) 3x    Duration Progress to 10 minutes continuous walking  at current work load and total walking time to 30-45 min      Intensity   THRR 40-80% of Max Heartrate 61-122    Ratings of Perceived Exertion 11-13    Perceived Dyspnea 0-4      Progression   Progression Continue progressive overload as per policy without signs/symptoms or physical distress.      Resistance Training   Training Prescription Yes    Weight 3lbs    Reps 10-15           Perform Capillary Blood Glucose checks as needed.  Exercise Prescription Changes:   Exercise Comments:   Exercise Goals and Review:   Exercise Goals    Row Name 12/28/19 1153             Exercise Goals   Increase Physical Activity Yes       Intervention Provide advice, education, support and counseling about physical activity/exercise needs.;Develop an individualized exercise prescription for aerobic and resistive training based on initial evaluation findings, risk stratification, comorbidities and participant's personal goals.       Expected Outcomes Short Term: Attend rehab on a regular basis to increase amount of physical activity.;Long Term: Add in home exercise to make exercise part of routine and to increase amount of physical activity.;Long Term: Exercising regularly at least 3-5 days a week.       Increase Strength and Stamina Yes       Intervention Provide advice, education, support and counseling about physical activity/exercise needs.;Develop an individualized exercise prescription for aerobic and resistive training  based on initial evaluation findings, risk stratification, comorbidities and participant's personal goals.       Expected Outcomes Short Term: Increase workloads from initial exercise prescription for resistance, speed, and METs.;Short Term: Perform resistance training exercises routinely during rehab and add in resistance training at home;Long Term: Improve cardiorespiratory fitness, muscular endurance and strength as measured by increased METs and functional capacity (6MWT)       Able to understand and use rate of perceived exertion (RPE) scale Yes       Intervention Provide education and explanation on how to use RPE scale       Expected Outcomes Short Term: Able to use RPE daily in rehab to express subjective intensity level;Long Term:  Able to use RPE to guide intensity level when exercising independently       Knowledge and understanding of Target Heart Rate Range (THRR) Yes       Intervention Provide education and explanation of THRR including how the numbers were predicted and where they are located for reference       Expected Outcomes Short Term: Able to state/look up THRR;Long Term: Able to use THRR to govern intensity when exercising independently;Short Term: Able to use daily as guideline for intensity in rehab       Able to check pulse independently Yes       Intervention Provide education and demonstration on how to check pulse in carotid  and radial arteries.;Review the importance of being able to check your own pulse for safety during independent exercise       Expected Outcomes Long Term: Able to check pulse independently and accurately;Short Term: Able to explain why pulse checking is important during independent exercise       Understanding of Exercise Prescription Yes       Intervention Provide education, explanation, and written materials on patient's individual exercise prescription       Expected Outcomes Short Term: Able to explain program exercise prescription;Long Term: Able to  explain home exercise prescription to exercise independently              Exercise Goals Re-Evaluation :    Discharge Exercise Prescription (Final Exercise Prescription Changes):   Nutrition:  Target Goals: Understanding of nutrition guidelines, daily intake of sodium 1500mg , cholesterol 200mg , calories 30% from fat and 7% or less from saturated fats, daily to have 5 or more servings of fruits and vegetables.  Biometrics:  Pre Biometrics - 12/28/19 1154      Pre Biometrics   Height 5' 7.5" (1.715 m)    Weight 89.7 kg    Waist Circumference 44.5 inches    Hip Circumference 45 inches    Waist to Hip Ratio 0.99 %    BMI (Calculated) 30.5    Triceps Skinfold 20 mm    % Body Fat 32 %    Grip Strength 31 kg    Flexibility 11 in    Single Leg Stand 30 seconds            Nutrition Therapy Plan and Nutrition Goals:   Nutrition Assessments:   Nutrition Goals Re-Evaluation:   Nutrition Goals Discharge (Final Nutrition Goals Re-Evaluation):   Psychosocial: Target Goals: Acknowledge presence or absence of significant depression and/or stress, maximize coping skills, provide positive support system. Participant is able to verbalize types and ability to use techniques and skills needed for reducing stress and depression.  Initial Review & Psychosocial Screening:  Initial Psych Review & Screening - 12/28/19 1459      Initial Review   Current issues with None Identified      Family Dynamics   Good Support System? Yes   Kasandra Knudsen has his wife for support     Barriers   Psychosocial barriers to participate in program There are no identifiable barriers or psychosocial needs.      Screening Interventions   Interventions Encouraged to exercise           Quality of Life Scores:  Quality of Life - 12/28/19 1158      Quality of Life   Select Quality of Life      Quality of Life Scores   Health/Function Pre 25.2 %    Socioeconomic Pre 26.43 %    Psych/Spiritual Pre  28.29 %    Family Pre 28.8 %    GLOBAL Pre 26.62 %          Scores of 19 and below usually indicate a poorer quality of life in these areas.  A difference of  2-3 points is a clinically meaningful difference.  A difference of 2-3 points in the total score of the Quality of Life Index has been associated with significant improvement in overall quality of life, self-image, physical symptoms, and general health in studies assessing change in quality of life.  PHQ-9: Recent Review Flowsheet Data    Depression screen [facility] 2/9 12/28/2019 05/31/2019 02/02/2019 10/18/2017 02/11/2017   Decreased Interest 0  0 0 0 0   Down, Depressed, Hopeless 0 1 0 0 0   PHQ - 2 Score 0 1 0 0 0   Altered sleeping - 2 - - -   Tired, decreased energy - 3 - - -   Change in appetite - 0 - - -   Feeling bad or failure about yourself  - 0 - - -   Trouble concentrating - 0 - - -   Moving slowly or fidgety/restless - 0 - - -   Suicidal thoughts - 0 - - -   PHQ-9 Score - 6 - - -     Interpretation of Total Score  Total Score Depression Severity:  1-4 = Minimal depression, 5-9 = Mild depression, 10-14 = Moderate depression, 15-19 = Moderately severe depression, 20-27 = Severe depression   Psychosocial Evaluation and Intervention:   Psychosocial Re-Evaluation:   Psychosocial Discharge (Final Psychosocial Re-Evaluation):   Vocational Rehabilitation: Provide vocational rehab assistance to qualifying candidates.   Vocational Rehab Evaluation & Intervention:  Vocational Rehab - 12/28/19 1501      Initial Vocational Rehab Evaluation & Intervention   Assessment shows need for Vocational Rehabilitation No           Education: Education Goals: Education classes will be provided on a weekly basis, covering required topics. Participant will state understanding/return demonstration of topics presented.  Learning Barriers/Preferences:  Learning Barriers/Preferences - 12/28/19 1201      Learning Barriers/Preferences    Learning Barriers Sight    Learning Preferences Written Material;Skilled Demonstration;Individual Instruction           Education Topics: Hypertension, Hypertension Reduction -Define heart disease and high blood pressure. Discus how high blood pressure affects the body and ways to reduce high blood pressure.   Exercise and Your Heart -Discuss why it is important to exercise, the FITT principles of exercise, normal and abnormal responses to exercise, and how to exercise safely.   Angina -Discuss definition of angina, causes of angina, treatment of angina, and how to decrease risk of having angina.   Cardiac Medications -Review what the following cardiac medications are used for, how they affect the body, and side effects that may occur when taking the medications.  Medications include Aspirin, Beta blockers, calcium channel blockers, ACE Inhibitors, angiotensin receptor blockers, diuretics, digoxin, and antihyperlipidemics.   Congestive Heart Failure -Discuss the definition of CHF, how to live with CHF, the signs and symptoms of CHF, and how keep track of weight and sodium intake.   Heart Disease and Intimacy -Discus the effect sexual activity has on the heart, how changes occur during intimacy as we age, and safety during sexual activity.   Smoking Cessation / COPD -Discuss different methods to quit smoking, the health benefits of quitting smoking, and the definition of COPD.   Nutrition I: Fats -Discuss the types of cholesterol, what cholesterol does to the heart, and how cholesterol levels can be controlled.   Nutrition II: Labels -Discuss the different components of food labels and how to read food label   Heart Parts/Heart Disease and PAD -Discuss the anatomy of the heart, the pathway of blood circulation through the heart, and these are affected by heart disease.   Stress I: Signs and Symptoms -Discuss the causes of stress, how stress may lead to anxiety and  depression, and ways to limit stress.   Stress II: Relaxation -Discuss different types of relaxation techniques to limit stress.   Warning Signs of Stroke / TIA -Discuss definition of  a stroke, what the signs and symptoms are of a stroke, and how to identify when someone is having stroke.   Knowledge Questionnaire Score:  Knowledge Questionnaire Score - 12/28/19 1158      Knowledge Questionnaire Score   Pre Score 21/24           Core Components/Risk Factors/Patient Goals at Admission:  Personal Goals and Risk Factors at Admission - 12/28/19 1502      Core Components/Risk Factors/Patient Goals on Admission    Weight Management Yes;Obesity    Intervention Weight Management: Develop a combined nutrition and exercise program designed to reach desired caloric intake, while maintaining appropriate intake of nutrient and fiber, sodium and fats, and appropriate energy expenditure required for the weight goal.;Weight Management: Provide education and appropriate resources to help participant work on and attain dietary goals.;Weight Management/Obesity: Establish reasonable short term and long term weight goals.    Expected Outcomes Short Term: Continue to assess and modify interventions until short term weight is achieved;Long Term: Adherence to nutrition and physical activity/exercise program aimed toward attainment of established weight goal;Weight Loss: Understanding of general recommendations for a balanced deficit meal plan, which promotes 1-2 lb weight loss per week and includes a negative energy balance of [id number] kcal/d;Understanding recommendations for meals to include 15-35% energy as protein, 25-35% energy from fat, 35-60% energy from carbohydrates, less than 200mg  of dietary cholesterol, 20-35 gm of total fiber daily;Understanding of distribution of calorie intake throughout the day with the consumption of 4-5 meals/snacks    Diabetes Yes    Intervention Provide education about  signs/symptoms and action to take for hypo/hyperglycemia.;Provide education about proper nutrition, including hydration, and aerobic/resistive exercise prescription along with prescribed medications to achieve blood glucose in normal ranges: Fasting glucose 65-99 mg/dL    Expected Outcomes Short Term: Participant verbalizes understanding of the signs/symptoms and immediate care of hyper/hypoglycemia, proper foot care and importance of medication, aerobic/resistive exercise and nutrition plan for blood glucose control.;Long Term: Attainment of HbA1C < 7%.    Hypertension Yes    Intervention Provide education on lifestyle modifcations including regular physical activity/exercise, weight management, moderate sodium restriction and increased consumption of fresh fruit, vegetables, and low fat dairy, alcohol moderation, and smoking cessation.;Monitor prescription use compliance.    Expected Outcomes Short Term: Continued assessment and intervention until BP is < 140/23mm HG in hypertensive participants. < 130/26mm HG in hypertensive participants with diabetes, heart failure or chronic kidney disease.;Long Term: Maintenance of blood pressure at goal levels.    Lipids Yes    Intervention Provide education and support for participant on nutrition & aerobic/resistive exercise along with prescribed medications to achieve LDL 70mg , HDL >40mg .    Expected Outcomes Short Term: Participant states understanding of desired cholesterol values and is compliant with medications prescribed. Participant is following exercise prescription and nutrition guidelines.;Long Term: Cholesterol controlled with medications as prescribed, with individualized exercise RX and with personalized nutrition plan. Value goals: LDL < 70mg , HDL > 40 mg.           Core Components/Risk Factors/Patient Goals Review:    Core Components/Risk Factors/Patient Goals at Discharge (Final Review):    ITP Comments:  ITP Comments    Row Name  12/28/19 1144           ITP Comments Dr. Fransico Him, Medical Director              Comments: Kasandra Knudsen attended orientation on 12/28/2019 to review rules and guidelines for program.  Completed 6 minute walk  test, Intitial ITP, and exercise prescription.  VSS. Telemetry-Sinus Rhythm.  Asymptomatic. Safety measures and social distancing in place per CDC guidelines.Barnet Pall, RN,BSN 12/28/2019 3:07 PM

## 2020-01-01 ENCOUNTER — Other Ambulatory Visit: Payer: Self-pay

## 2020-01-01 ENCOUNTER — Encounter: Payer: Self-pay | Admitting: Family Medicine

## 2020-01-01 ENCOUNTER — Encounter (HOSPITAL_COMMUNITY)
Admission: RE | Admit: 2020-01-01 | Discharge: 2020-01-01 | Disposition: A | Discharge status: Home / Self Care | Payer: Medicare HMO | Source: Ambulatory Visit | Attending: Cardiology | Admitting: Cardiology

## 2020-01-01 DIAGNOSIS — Z955 Presence of coronary angioplasty implant and graft: Secondary | ICD-10-CM | POA: Diagnosis present

## 2020-01-01 DIAGNOSIS — I2111 ST elevation (STEMI) myocardial infarction involving right coronary artery: Secondary | ICD-10-CM

## 2020-01-01 LAB — GLUCOSE, CAPILLARY: Glucose-Capillary: 85 mg/dL (ref 70–99)

## 2020-01-01 NOTE — Progress Notes (Signed)
Daily Session Note  Patient Details  Name: Nickalus Thornsberry MRN: [id number] Date of Birth: 01/10/1951 Referring Provider:     [place] from 12/28/2019 in [place]  Referring Provider [place], Peter MD      Encounter Date: 01/01/2020  Check In:  Session Check In - 01/01/20 1337      Check-In   Supervising physician immediately available to respond to emergencies [facility] immediately available    Physician(s) Dr. Cathlean Sauer    Location [facility]    Staff Present Dorma Russell, MS,ACSM CEP, Exercise Physiologist;David Lilyan Punt, MS, EP-C, CCRP;[place] Rollene Rotunda, RN, Deland Pretty, MS, ACSM CEP, Exercise Physiologist    Virtual Visit No    Medication changes reported     No    Fall or balance concerns reported    No    Tobacco Cessation No Change    Warm-up and Cool-down Performed on first and last piece of equipment    Resistance Training Performed Yes    VAD Patient? No    PAD/SET Patient? No      Pain Assessment   Currently in Pain? No/denies    Pain Score 0-No pain    Multiple Pain Sites No           Capillary Blood Glucose: Results for orders placed or performed during the hospital encounter of 01/01/20 (from the past 24 hour(s))  Glucose, capillary     Status: None   Collection Time: 01/01/20  2:33 PM  Result Value Ref Range   Glucose-Capillary 85 70 - 99 mg/dL     Exercise Prescription Changes - 01/01/20 1400      Response to Exercise   Blood Pressure (Admit) 108/58 (P)     Blood Pressure (Exercise) 126/70 (P)     Blood Pressure (Exit) 106/71 (P)     Heart Rate (Admit) 82 bpm (P)     Heart Rate (Exercise) 105 bpm (P)     Heart Rate (Exit) 93 bpm (P)     Rating of Perceived Exertion (Exercise) 11 (P)     Symptoms None (P)     Comments Pt's first day of exercise in the CRP2 program. (P)     Duration Progress to 30 minutes of  aerobic without signs/symptoms of physical distress (P)      Intensity THRR unchanged (P)       Progression   Progression Continue to progress workloads to maintain intensity without signs/symptoms of physical distress. (P)     Average METs 2.4 (P)       Resistance Training   Training Prescription Yes (P)     Weight 3 lbs (P)     Reps 10-15 (P)     Time 10 Minutes (P)       Interval Training   Interval Training No (P)            Social History   Tobacco Use  Smoking Status Former Smoker  . Quit date: 04/20/1974  . Years since quitting: 45.7  Smokeless Tobacco Never Used  Tobacco Comment   quit over 20 years    Goals Met:  Proper associated with RPD/PD & O2 Sat Exercise tolerated well Personal goals reviewed No report of cardiac concerns or symptoms Strength training completed today  Goals Unmet:  Not Applicable  Comments: Pt started cardiac rehab today.  Pt tolerated light exercise without difficulty. VSS, telemetry-NSR, asymptomatic.  Medication list reconciled. Pt denies barriers to medicaiton compliance.  PSYCHOSOCIAL ASSESSMENT:  PHQ-0. Pt exhibits positive coping skills, hopeful outlook with supportive family. No psychosocial needs identified at this time, no psychosocial interventions necessary.  Pt oriented to exercise equipment and routine. Understanding verbalized.   Dr. Fransico Him is Medical Director for Cardiac Rehab at [facility].

## 2020-01-03 ENCOUNTER — Encounter (HOSPITAL_COMMUNITY)
Admission: RE | Admit: 2020-01-03 | Discharge: 2020-01-03 | Disposition: A | Discharge status: Home / Self Care | Payer: Medicare HMO | Source: Ambulatory Visit | Attending: Cardiology | Admitting: Cardiology

## 2020-01-03 ENCOUNTER — Other Ambulatory Visit: Payer: Self-pay

## 2020-01-03 DIAGNOSIS — Z955 Presence of coronary angioplasty implant and graft: Secondary | ICD-10-CM

## 2020-01-03 DIAGNOSIS — I2111 ST elevation (STEMI) myocardial infarction involving right coronary artery: Secondary | ICD-10-CM | POA: Diagnosis not present

## 2020-01-03 LAB — GLUCOSE, CAPILLARY: Glucose-Capillary: 117 mg/dL — ABNORMAL HIGH (ref 70–99)

## 2020-01-03 NOTE — Progress Notes (Signed)
Alan Morrison 69 y.o. male Nutrition Note  Diagnosis:  Past Medical History:  Diagnosis Date  . Anemia, iron deficiency, inadequate dietary intake   . 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   by [place]  . CAD (coronary artery disease) 2003   cath - Min Dz, EF 65%, Adm.R/O'd  . Cervical spondylosis without myelopathy   . Chest pain, atypical   . Choroidal nevus 01/22/2011   left, yearly eye exam, no diabetic retinopathy  . Diabetes mellitus type II   . Dyspepsia   . Ex-smoker   . Fatty liver 02/2016   by [place]  . GERD (gastroesophageal reflux disease)   . Headache(784.0)   . Heart murmur    Hx of  . History of MRI of brain and brain stem 09/06  . History of MRSA infection 12/2013   R knee boil  . HLD (hyperlipidemia)   . Hyperuricemia   . Obesity   . Sleep apnea   . Stress-induced cardiomyopathy 09/27/98   WNL, EF 64%  . Urosepsis 01/01-01/05/10   Hospitalization     Medications reviewed.   Current Outpatient Medications:  .  acetaminophen (TYLENOL) 500 MG tablet, Take 1,000 mg by mouth 2 (two) times daily as needed for headache (pain)., Disp: , Rfl:  .  aspirin EC 81 MG tablet, Take 81 mg by mouth daily with supper. Swallow whole., Disp: , Rfl:  .  Calcium Carbonate Antacid (TUMS PO), Take 1 tablet by mouth daily as needed (acid reflux/indigestion)., Disp: , Rfl:  .  Cholecalciferol (VITAMIN D) 2000 units tablet, Take 2,000 Units by mouth daily with breakfast. , Disp: , Rfl:  .  clopidogrel (PLAVIX) 75 MG tablet, Take 1 tablet (75 mg total) by mouth daily with breakfast., Disp: 90 tablet, Rfl: 3 .  finasteride (PROSCAR) 5 MG tablet, Take 1 tablet (5 mg total) by mouth daily., Disp: 90 tablet, Rfl: 3 .  Insulin Glargine (BASAGLAR KWIKPEN) 100 UNIT/ML, Inject 0.4 mLs (40 Units total) into the skin at bedtime., Disp: , Rfl:  .  Insulin Pen Needle (B-D ULTRAFINE III SHORT PEN) 31G X 8 MM MISC, 1 Device by Other route 4 (four) times daily. USE AS DIRECTED DAILY, Disp: 120 each, Rfl:  11 .  losartan (COZAAR) 25 MG tablet, Take 1 tablet (25 mg total) by mouth daily., Disp: 90 tablet, Rfl: 3 .  metFORMIN (GLUCOPHAGE) 1000 MG tablet, Take 1,000 mg by mouth daily with breakfast., Disp: , Rfl:  .  metoprolol tartrate (LOPRESSOR) 25 MG tablet, TAKE 0.5 TABLETS (12.5 MG TOTAL) BY MOUTH 2 (TWO) TIMES DAILY., Disp: 90 tablet, Rfl: 3 .  Multiple Vitamin (MULTIVITAMIN WITH MINERALS) TABS tablet, Take 1 tablet by mouth daily with breakfast. , Disp: , Rfl:  .  mupirocin cream (BACTROBAN) 2 %, Apply 1 application topically 3 (three) times daily. (Patient not taking: Reported on 12/26/2019), Disp: 30 g, Rfl: 0 .  ONETOUCH ULTRA test strip, USE TO CHECK SUGARS DAILY  AS DIRECTED, Disp: 100 strip, Rfl: 3 .  probenecid (BENEMID) 500 MG tablet, Take 0.5 tablets (250 mg total) by mouth every other day., Disp: 45 tablet, Rfl: 3 .  rosuvastatin (CRESTOR) 40 MG tablet, Take 1 tablet (40 mg total) by mouth daily with supper., Disp: 90 tablet, Rfl: 3 .  tamsulosin (FLOMAX) 0.4 MG CAPS capsule, Take 1 capsule (0.4 mg total) by mouth daily., Disp: 90 capsule, Rfl: 3 .  vitamin C (ASCORBIC ACID) 500 MG tablet, Take 500 mg by mouth daily with supper. ,  Disp: , Rfl:    Ht Readings from Last 1 Encounters:  12/28/19 5' 7.5" (1.715 m)     Wt Readings from Last 3 Encounters:  12/28/19 197 lb 12 oz (89.7 kg)  11/30/19 203 lb (92.1 kg)  11/27/19 205 lb 9.6 oz (93.3 kg)     There is no height or weight on file to calculate BMI.   Social History   Tobacco Use  Smoking Status Former Smoker  . Quit date: 04/20/1974  . Years since quitting: 45.7  Smokeless Tobacco Never Used  Tobacco Comment   quit over 20 years     Lab Results  Component Value Date   CHOL 94 11/12/2019   Lab Results  Component Value Date   HDL 35 (L) 11/12/2019   Lab Results  Component Value Date   LDLCALC 47 11/12/2019   Lab Results  Component Value Date   TRIG 60 11/12/2019     Lab Results  Component Value Date    HGBA1C 6.3 (H) 11/13/2019     CBG (last 3)  Recent Labs    01/01/20 1433  GLUCAP 85     Nutrition Note  Spoke with pt. Nutrition Plan and Nutrition Survey goals reviewed with pt. Pt is following a Heart Healthy diet.  Pt has Type 2 Diabetes. Last A1c indicates blood glucose well-controlled.  Pt checks CBG's 2 times a day. Fasting CBG's reportedly 90-105 mg/dL.    Per discussion, pt does not use canned/convenience foods often. Pt does not add salt to food. Pt does not eat out frequently.   Reviewed weight and diet history. He has lost 15 lbs making these diet changes since having STEMI. Goal weight 190 lbs.  Recommended increasing protein intake to maintain muscle mass with weight loss.  Diet recall B - cheerios, fruit, skim milk L- peanut butter and Sf jelly sandwich on ww bread D - grilled chicken and salad OR sauteed non starchy veggies and salad  Pt expressed understanding of the information reviewed.   Nutrition Diagnosis Obese  I = 30-34.9 related to excessive energy intake as evidenced by a BMi 30.52 kg/m2  Nutrition Intervention ? Pt's individual nutrition plan reviewed with pt. ? Benefits of adopting Heart Healthy diet discussed when Medficts reviewed.   ? Pt given handouts for: ? Nutrition I class ? Nutrition II class ? Diabetes  ? Continue client-centered nutrition education by RD, as part of interdisciplinary care.  Goal(s) ? Pt to identify food quantities necessary to achieve weight loss of 6-24 lb at graduation from cardiac rehab.  ? Pt to build a healthy plate including vegetables, fruits, whole grains, and low-fat dairy products in a heart healthy meal plan ? Pt to incorporate protein shake  Plan:   Will provide client-centered nutrition education as part of interdisciplinary care  Monitor and evaluate progress toward nutrition goal with team.   Michaele Offer, MS, RDN, LDN

## 2020-01-04 LAB — GLUCOSE, CAPILLARY: Glucose-Capillary: 142 mg/dL — ABNORMAL HIGH (ref 70–99)

## 2020-01-05 ENCOUNTER — Encounter (HOSPITAL_COMMUNITY): Payer: Medicare HMO

## 2020-01-08 ENCOUNTER — Encounter (HOSPITAL_COMMUNITY)
Admission: RE | Admit: 2020-01-08 | Discharge: 2020-01-08 | Disposition: A | Discharge status: Home / Self Care | Payer: Medicare HMO | Source: Ambulatory Visit | Attending: Cardiology | Admitting: Cardiology

## 2020-01-08 ENCOUNTER — Other Ambulatory Visit: Payer: Self-pay

## 2020-01-08 DIAGNOSIS — Z955 Presence of coronary angioplasty implant and graft: Secondary | ICD-10-CM | POA: Diagnosis not present

## 2020-01-08 DIAGNOSIS — I2111 ST elevation (STEMI) myocardial infarction involving right coronary artery: Secondary | ICD-10-CM

## 2020-01-08 LAB — GLUCOSE, CAPILLARY: Glucose-Capillary: 83 mg/dL (ref 70–99)

## 2020-01-09 DIAGNOSIS — R69 Illness, unspecified: Secondary | ICD-10-CM | POA: Diagnosis not present

## 2020-01-10 ENCOUNTER — Other Ambulatory Visit: Payer: Self-pay

## 2020-01-10 ENCOUNTER — Encounter (HOSPITAL_COMMUNITY)
Admission: RE | Admit: 2020-01-10 | Discharge: 2020-01-10 | Disposition: A | Discharge status: Home / Self Care | Payer: Medicare HMO | Source: Ambulatory Visit | Attending: Cardiology | Admitting: Cardiology

## 2020-01-10 DIAGNOSIS — I2111 ST elevation (STEMI) myocardial infarction involving right coronary artery: Secondary | ICD-10-CM

## 2020-01-10 DIAGNOSIS — Z955 Presence of coronary angioplasty implant and graft: Secondary | ICD-10-CM

## 2020-01-12 ENCOUNTER — Encounter (HOSPITAL_COMMUNITY): Payer: Medicare HMO

## 2020-01-15 ENCOUNTER — Encounter (HOSPITAL_COMMUNITY)
Admission: RE | Admit: 2020-01-15 | Discharge: 2020-01-15 | Disposition: A | Discharge status: Home / Self Care | Payer: Medicare HMO | Source: Ambulatory Visit | Attending: Cardiology | Admitting: Cardiology

## 2020-01-15 ENCOUNTER — Other Ambulatory Visit: Payer: Self-pay

## 2020-01-15 DIAGNOSIS — I2111 ST elevation (STEMI) myocardial infarction involving right coronary artery: Secondary | ICD-10-CM | POA: Diagnosis not present

## 2020-01-15 DIAGNOSIS — Z955 Presence of coronary angioplasty implant and graft: Secondary | ICD-10-CM | POA: Diagnosis not present

## 2020-01-16 NOTE — Progress Notes (Signed)
Cardiac Individual Treatment Plan  Patient Details  Name: Alan Morrison MRN: [id number] Date of Birth: [birth year] Referring Provider:     CARDIAC REHAB PHASE II ORIENTATION from 12/28/2019 in [place]  Referring Provider Martinique, Peter MD      Initial Encounter Date:    CARDIAC REHAB PHASE II ORIENTATION from 12/28/2019 in [place]  Date 12/28/19      Visit Diagnosis: ST elevation myocardial infarction involving right coronary artery ([place]) 11/12/19  S/P DES RCA 11/12/19  Patient's Home Medications on Admission:  Current Outpatient Medications:  .  acetaminophen (TYLENOL) 500 MG tablet, Take 1,000 mg by mouth 2 (two) times daily as needed for headache (pain)., Disp: , Rfl:  .  aspirin EC 81 MG tablet, Take 81 mg by mouth daily with supper. Swallow whole., Disp: , Rfl:  .  Calcium Carbonate Antacid (TUMS PO), Take 1 tablet by mouth daily as needed (acid reflux/indigestion)., Disp: , Rfl:  .  Cholecalciferol (VITAMIN D) 2000 units tablet, Take 2,000 Units by mouth daily with breakfast. , Disp: , Rfl:  .  clopidogrel (PLAVIX) 75 MG tablet, Take 1 tablet (75 mg total) by mouth daily with breakfast., Disp: 90 tablet, Rfl: 3 .  finasteride (PROSCAR) 5 MG tablet, Take 1 tablet (5 mg total) by mouth daily., Disp: 90 tablet, Rfl: 3 .  Insulin Glargine (BASAGLAR KWIKPEN) 100 UNIT/ML, Inject 0.4 mLs (40 Units total) into the skin at bedtime., Disp: , Rfl:  .  Insulin Pen Needle (B-D ULTRAFINE III SHORT PEN) 31G X 8 MM MISC, 1 Device by Other route 4 (four) times daily. USE AS DIRECTED DAILY, Disp: 120 each, Rfl: 11 .  losartan (COZAAR) 25 MG tablet, Take 1 tablet (25 mg total) by mouth daily., Disp: 90 tablet, Rfl: 3 .  metFORMIN (GLUCOPHAGE) 1000 MG tablet, Take 1,000 mg by mouth daily with breakfast., Disp: , Rfl:  .  metoprolol tartrate (LOPRESSOR) 25 MG tablet, TAKE 0.5 TABLETS (12.5 MG TOTAL) BY MOUTH 2 (TWO) TIMES DAILY., Disp:  90 tablet, Rfl: 3 .  Multiple Vitamin (MULTIVITAMIN WITH MINERALS) TABS tablet, Take 1 tablet by mouth daily with breakfast. , Disp: , Rfl:  .  mupirocin cream (BACTROBAN) 2 %, Apply 1 application topically 3 (three) times daily. (Patient not taking: Reported on 12/26/2019), Disp: 30 g, Rfl: 0 .  ONETOUCH ULTRA test strip, USE TO CHECK SUGARS DAILY  AS DIRECTED, Disp: 100 strip, Rfl: 3 .  probenecid (BENEMID) 500 MG tablet, Take 0.5 tablets (250 mg total) by mouth every other day., Disp: 45 tablet, Rfl: 3 .  rosuvastatin (CRESTOR) 40 MG tablet, Take 1 tablet (40 mg total) by mouth daily with supper., Disp: 90 tablet, Rfl: 3 .  tamsulosin (FLOMAX) 0.4 MG CAPS capsule, Take 1 capsule (0.4 mg total) by mouth daily., Disp: 90 capsule, Rfl: 3 .  vitamin C (ASCORBIC ACID) 500 MG tablet, Take 500 mg by mouth daily with supper. , Disp: , Rfl:   Past Medical History: Past Medical History:  Diagnosis Date  . Anemia, iron deficiency, inadequate dietary intake   . 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   by [place]  . CAD (coronary artery disease) 2003   cath - Min Dz, EF 65%, Adm.R/O'd  . Cervical spondylosis without myelopathy   . Chest pain, atypical   . Choroidal nevus 01/22/2011   left, yearly eye exam, no diabetic retinopathy  . Diabetes mellitus type II   . Dyspepsia   .  Ex-smoker   . Fatty liver 02/2016   by [place]  . GERD (gastroesophageal reflux disease)   . Headache(784.0)   . Heart murmur    Hx of  . History of MRI of brain and brain stem 09/06  . History of MRSA infection 12/2013   R knee boil  . HLD (hyperlipidemia)   . Hyperuricemia   . Obesity   . Sleep apnea   . Stress-induced cardiomyopathy 09/27/98   WNL, EF 64%  . Urosepsis 01/01-01/05/10   Hospitalization    Tobacco Use: Social History   Tobacco Use  Smoking Status Former Smoker  . Quit date: 04/20/1974  . Years since quitting: 45.7  Smokeless Tobacco Never Used  Tobacco Comment   quit over 20 years    Labs: Recent  Review Flowsheet Data    Labs for ITP Cardiac and Pulmonary Rehab Latest Ref Rng & Units 01/26/2019 05/26/2019 06/19/2019 11/12/2019 11/13/2019   Cholestrol 0 - 200 mg/dL 94 92 - 94 -   LDLCALC 0 - 99 mg/dL 47 42 - 47 -   HDL >40 mg/dL 29.90(L) 28.30(L) - 35(L) -   Trlycerides <150 mg/dL 85.0 112.0 - 60 -   Hemoglobin A1c 4.8 - 5.6 % 7.2(H) 10.4(H) 8.8(A) 6.3(H) 6.3(H)   TCO2 0 - 100 mmol/L - - - - -      Capillary Blood Glucose: Lab Results  Component Value Date   GLUCAP 83 01/08/2020   GLUCAP 117 (H) 01/03/2020   GLUCAP 142 (H) 01/03/2020   GLUCAP 85 01/01/2020   GLUCAP 121 (H) 11/14/2019     Exercise Target Goals: Exercise Program Goal: Individual exercise prescription set using results from initial 6 min walk test and THRR while considering  patient's activity barriers and safety.   Exercise Prescription Goal: Initial exercise prescription builds to 30-45 minutes a day of aerobic activity, 2-3 days per week.  Home exercise guidelines will be given to patient during program as part of exercise prescription that the participant will acknowledge.  Activity Barriers & Risk Stratification:  Activity Barriers & Cardiac Risk Stratification - 12/28/19 1153      Activity Barriers & Cardiac Risk Stratification   Activity Barriers Joint Problems    Cardiac Risk Stratification High           6 Minute Walk:  6 Minute Walk    Row Name 12/28/19 1144         6 Minute Walk   Phase Initial     Distance 1491 feet     Walk Time 6 minutes     # of Rest Breaks 0     MPH 2.8     METS 3.02     RPE 12     Perceived Dyspnea  0     VO2 Peak 10.6     Symptoms No     Resting HR 67 bpm     Resting BP 104/72     Resting Oxygen Saturation  97 %     Exercise Oxygen Saturation  during 6 min walk 97 %     Max Ex. HR 92 bpm     Max Ex. BP 120/80     2 Minute Post BP 106/70            Oxygen Initial Assessment:   Oxygen Re-Evaluation:   Oxygen Discharge (Final Oxygen  Re-Evaluation):   Initial Exercise Prescription:  Initial Exercise Prescription - 12/28/19 1200      Date of Initial Exercise RX and  Referring Provider   Date 12/28/19    Referring Provider Martinique, Peter MD    Expected Discharge Date 02/23/20      Recumbant Bike   Level 1.5    Watts 15    Minutes 15    METs 2.5      NuStep   Level 2    SPM 80    Minutes 15    METs 2.5      Prescription Details   Frequency (times per week) 3x    Duration Progress to 10 minutes continuous walking  at current work load and total walking time to 30-45 min      Intensity   THRR 40-80% of Max Heartrate 61-122    Ratings of Perceived Exertion 11-13    Perceived Dyspnea 0-4      Progression   Progression Continue progressive overload as per policy without signs/symptoms or physical distress.      Resistance Training   Training Prescription Yes    Weight 3lbs    Reps 10-15           Perform Capillary Blood Glucose checks as needed.  Exercise Prescription Changes:   Exercise Prescription Changes    Row Name 01/01/20 1400 01/15/20 1500           Response to Exercise   Blood Pressure (Admit) 108/58 118/78      Blood Pressure (Exercise) 126/70 132/72      Blood Pressure (Exit) 106/71 104/60      Heart Rate (Admit) 82 bpm 84 bpm      Heart Rate (Exercise) 105 bpm 128 bpm      Heart Rate (Exit) 93 bpm 78 bpm      Rating of Perceived Exertion (Exercise) 11 12      Symptoms None None      Comments Pt's first day of exercise in the CRP2 program. Reviewed METs.      Duration Progress to 30 minutes of  aerobic without signs/symptoms of physical distress Progress to 30 minutes of  aerobic without signs/symptoms of physical distress      Intensity THRR unchanged THRR unchanged        Progression   Progression Continue to progress workloads to maintain intensity without signs/symptoms of physical distress. Continue to progress workloads to maintain intensity without signs/symptoms of  physical distress.      Average METs 2.4 2.95        Resistance Training   Training Prescription Yes Yes      Weight 3 lbs 3 lbs      Reps 10-15 10-15      Time 10 Minutes 10 Minutes        Interval Training   Interval Training No No        Recumbant Bike   Level 1.5 2      Minutes 15 15      METs 2.3 2.7        NuStep   Level 2 3      SPM 80 85      Minutes 15 15      METs 2.5 3.2             Exercise Comments:   Exercise Comments    Row Name 01/01/20 1446 01/15/20 1445         Exercise Comments Pt's first day of exericse in the CRP2 program. Pt tolerated session well with no complaints. Reviewed METs. Pt is making good progress in the CRP2 program  and is tolerating the exercise well with no complaints. Will continue to monitor and progress as tolerated.             Exercise Goals and Review:   Exercise Goals    Row Name 12/28/19 1153             Exercise Goals   Increase Physical Activity Yes       Intervention Provide advice, education, support and counseling about physical activity/exercise needs.;Develop an individualized exercise prescription for aerobic and resistive training based on initial evaluation findings, risk stratification, comorbidities and participant's personal goals.       Expected Outcomes Short Term: Attend rehab on a regular basis to increase amount of physical activity.;Long Term: Add in home exercise to make exercise part of routine and to increase amount of physical activity.;Long Term: Exercising regularly at least 3-5 days a week.       Increase Strength and Stamina Yes       Intervention Provide advice, education, support and counseling about physical activity/exercise needs.;Develop an individualized exercise prescription for aerobic and resistive training based on initial evaluation findings, risk stratification, comorbidities and participant's personal goals.       Expected Outcomes Short Term: Increase workloads from initial  exercise prescription for resistance, speed, and METs.;Short Term: Perform resistance training exercises routinely during rehab and add in resistance training at home;Long Term: Improve cardiorespiratory fitness, muscular endurance and strength as measured by increased METs and functional capacity (6MWT)       Able to understand and use rate of perceived exertion (RPE) scale Yes       Intervention Provide education and explanation on how to use RPE scale       Expected Outcomes Short Term: Able to use RPE daily in rehab to express subjective intensity level;Long Term:  Able to use RPE to guide intensity level when exercising independently       Knowledge and understanding of Target Heart Rate Range (THRR) Yes       Intervention Provide education and explanation of THRR including how the numbers were predicted and where they are located for reference       Expected Outcomes Short Term: Able to state/look up THRR;Long Term: Able to use THRR to govern intensity when exercising independently;Short Term: Able to use daily as guideline for intensity in rehab       Able to check pulse independently Yes       Intervention Provide education and demonstration on how to check pulse in carotid and radial arteries.;Review the importance of being able to check your own pulse for safety during independent exercise       Expected Outcomes Long Term: Able to check pulse independently and accurately;Short Term: Able to explain why pulse checking is important during independent exercise       Understanding of Exercise Prescription Yes       Intervention Provide education, explanation, and written materials on patient's individual exercise prescription       Expected Outcomes Short Term: Able to explain program exercise prescription;Long Term: Able to explain home exercise prescription to exercise independently              Exercise Goals Re-Evaluation :  Exercise Goals Re-Evaluation    [place] Name 01/01/20 1445  01/17/20 1624           Exercise Goal Re-Evaluation   Exercise Goals Review Increase Physical Activity;Increase Strength and Stamina;Able to understand and use rate of perceived exertion (RPE) scale;Knowledge  and understanding of Target Heart Rate Range (THRR);Able to check pulse independently;Understanding of Exercise Prescription Increase Physical Activity;Increase Strength and Stamina;Able to understand and use rate of perceived exertion (RPE) scale;Knowledge and understanding of Target Heart Rate Range (THRR);Able to check pulse independently;Understanding of Exercise Prescription      Comments Pt's first day of exercise in the CRP2 program. Pt tolerated exercise well and understands exercise Rx, RPE scale and THRR. Reviewd Home execise Rx today with patient. Pt encouraged wo walk 2-3 x/week at home in addtion to his CRP2 exercise program. Pt agrees. Pt verbalized understanding of the home exercise Rx and was provided copy.      Expected Outcomes Will continue to monitor patient reposnse to exercise and progress as tolerated. Pt will walk at home 2-3x/week for 30-45 minutes.             Discharge Exercise Prescription (Final Exercise Prescription Changes):  Exercise Prescription Changes - 01/15/20 1500      Response to Exercise   Blood Pressure (Admit) 118/78    Blood Pressure (Exercise) 132/72    Blood Pressure (Exit) 104/60    Heart Rate (Admit) 84 bpm    Heart Rate (Exercise) 128 bpm    Heart Rate (Exit) 78 bpm    Rating of Perceived Exertion (Exercise) 12    Symptoms None    Comments Reviewed METs.    Duration Progress to 30 minutes of  aerobic without signs/symptoms of physical distress    Intensity THRR unchanged      Progression   Progression Continue to progress workloads to maintain intensity without signs/symptoms of physical distress.    Average METs 2.95      Resistance Training   Training Prescription Yes    Weight 3 lbs    Reps 10-15    Time 10 Minutes       Interval Training   Interval Training No      Recumbant Bike   Level 2    Minutes 15    METs 2.7      NuStep   Level 3    SPM 85    Minutes 15    METs 3.2           Nutrition:  Target Goals: Understanding of nutrition guidelines, daily intake of sodium 1500mg , cholesterol 200mg , calories 30% from fat and 7% or less from saturated fats, daily to have 5 or more servings of fruits and vegetables.  Biometrics:  Pre Biometrics - 12/28/19 1154      Pre Biometrics   Height 5' 7.5" (1.715 m)    Weight 89.7 kg    Waist Circumference 44.5 inches    Hip Circumference 45 inches    Waist to Hip Ratio 0.99 %    BMI (Calculated) 30.5    Triceps Skinfold 20 mm    % Body Fat 32 %    Grip Strength 31 kg    Flexibility 11 in    Single Leg Stand 30 seconds            Nutrition Therapy Plan and Nutrition Goals:  Nutrition Therapy & Goals - 01/03/20 1427      Nutrition Therapy   Diet Heart Healthy    Drug/Food Interactions Statins/Certain Fruits      Personal Nutrition Goals   Nutrition Goal Pt to identify food quantities necessary to achieve weight loss of 6-24 lb at graduation from cardiac rehab.    Personal Goal #2 Pt to build a healthy plate including vegetables,  fruits, whole grains, and low-fat dairy products in a heart healthy meal plan    Personal Goal #3 Pt to incorporate protein shake      Intervention Plan   Intervention Prescribe, educate and counsel regarding individualized specific dietary modifications aiming towards targeted core components such as weight, hypertension, lipid management, diabetes, heart failure and other comorbidities.;Nutrition handout(s) given to patient.    Expected Outcomes Short Term Goal: Understand basic principles of dietary content, such as calories, fat, sodium, cholesterol and nutrients.           Nutrition Assessments:  Nutrition Assessments - 01/09/20 1022      MEDFICTS Scores   Pre Score 0           Nutrition Goals  Re-Evaluation:  Nutrition Goals Re-Evaluation    [place] Name 01/03/20 1428 01/16/20 0902           Goals   Current Weight 197 lb (89.4 kg) 198 lb 6.6 oz (90 kg)      Nutrition Goal Pt to identify food quantities necessary to achieve weight loss of 6-24 lb at graduation from cardiac rehab. Pt to identify food quantities necessary to achieve weight loss of 6-24 lb at graduation from cardiac rehab.        Personal Goal #2 Re-Evaluation   Personal Goal #2 Pt to build a healthy plate including vegetables, fruits, whole grains, and low-fat dairy products in a heart healthy meal plan Pt to build a healthy plate including vegetables, fruits, whole grains, and low-fat dairy products in a heart healthy meal plan        Personal Goal #3 Re-Evaluation   Personal Goal #3 Pt to incorporate protein shake Pt to incorporate protein shake             Nutrition Goals Re-Evaluation:  Nutrition Goals Re-Evaluation    [place] Name 01/03/20 1428 01/16/20 0902           Goals   Current Weight 197 lb (89.4 kg) 198 lb 6.6 oz (90 kg)      Nutrition Goal Pt to identify food quantities necessary to achieve weight loss of 6-24 lb at graduation from cardiac rehab. Pt to identify food quantities necessary to achieve weight loss of 6-24 lb at graduation from cardiac rehab.        Personal Goal #2 Re-Evaluation   Personal Goal #2 Pt to build a healthy plate including vegetables, fruits, whole grains, and low-fat dairy products in a heart healthy meal plan Pt to build a healthy plate including vegetables, fruits, whole grains, and low-fat dairy products in a heart healthy meal plan        Personal Goal #3 Re-Evaluation   Personal Goal #3 Pt to incorporate protein shake Pt to incorporate protein shake             Nutrition Goals Discharge (Final Nutrition Goals Re-Evaluation):  Nutrition Goals Re-Evaluation - 01/16/20 0902      Goals   Current Weight 198 lb 6.6 oz (90 kg)    Nutrition Goal Pt to identify food  quantities necessary to achieve weight loss of 6-24 lb at graduation from cardiac rehab.      Personal Goal #2 Re-Evaluation   Personal Goal #2 Pt to build a healthy plate including vegetables, fruits, whole grains, and low-fat dairy products in a heart healthy meal plan      Personal Goal #3 Re-Evaluation   Personal Goal #3 Pt to incorporate protein shake  Psychosocial: Target Goals: Acknowledge presence or absence of significant depression and/or stress, maximize coping skills, provide positive support system. Participant is able to verbalize types and ability to use techniques and skills needed for reducing stress and depression.  Initial Review & Psychosocial Screening:  Initial Psych Review & Screening - 12/28/19 1459      Initial Review   Current issues with None Identified      Family Dynamics   Good Support System? Yes   Kasandra Knudsen has his wife for support     Barriers   Psychosocial barriers to participate in program There are no identifiable barriers or psychosocial needs.      Screening Interventions   Interventions Encouraged to exercise           Quality of Life Scores:  Quality of Life - 12/28/19 1158      Quality of Life   Select Quality of Life      Quality of Life Scores   Health/Function Pre 25.2 %    Socioeconomic Pre 26.43 %    Psych/Spiritual Pre 28.29 %    Family Pre 28.8 %    GLOBAL Pre 26.62 %          Scores of 19 and below usually indicate a poorer quality of life in these areas.  A difference of  2-3 points is a clinically meaningful difference.  A difference of 2-3 points in the total score of the Quality of Life Index has been associated with significant improvement in overall quality of life, self-image, physical symptoms, and general health in studies assessing change in quality of life.  PHQ-9: Recent Review Flowsheet Data    Depression screen [facility] 2/9 12/28/2019 05/31/2019 02/02/2019 10/18/2017 02/11/2017   Decreased Interest 0 0 0  0 0   Down, Depressed, Hopeless 0 1 0 0 0   PHQ - 2 Score 0 1 0 0 0   Altered sleeping - 2 - - -   Tired, decreased energy - 3 - - -   Change in appetite - 0 - - -   Feeling bad or failure about yourself  - 0 - - -   Trouble concentrating - 0 - - -   Moving slowly or fidgety/restless - 0 - - -   Suicidal thoughts - 0 - - -   PHQ-9 Score - 6 - - -     Interpretation of Total Score  Total Score Depression Severity:  1-4 = Minimal depression, 5-9 = Mild depression, 10-14 = Moderate depression, 15-19 = Moderately severe depression, 20-27 = Severe depression   Psychosocial Evaluation and Intervention:  Psychosocial Evaluation - 01/01/20 1608      Psychosocial Evaluation & Interventions   Interventions Encouraged to exercise with the program and follow exercise prescription    Comments Mr. Guedes presents to his first cardiac rehab exercise session with a positive attitude and outlook. He admits to a very strong support system of famiy and friends. He enjoys yard work and Location manager. He denies psychosocial barriers to participation in CR and self health management. No interventions needed at this time.    Expected Outcomes Mr. Couser will continue to have a positive attitude and outlook.    Continue Psychosocial Services  No Follow up required           Psychosocial Re-Evaluation:  Psychosocial Re-Evaluation    [place] Name 01/16/20 1143             Psychosocial Re-Evaluation   Current issues with  None Identified       Comments Mr. Denk continues to presents to cardiac rehab exercise session with a positive attitude and outlook. He admits to a very strong support system of famiy and friends. He enjoys yard work and Location manager. He denies psychosocial barriers to participation in CR and self health management. No interventions needed at this time.       Expected Outcomes Mr. Hoglund will continue to deny psychosocial barriers to participation in CR or self health management.        Interventions Encouraged to attend Cardiac Rehabilitation for the exercise       Continue Psychosocial Services  No Follow up required              Psychosocial Discharge (Final Psychosocial Re-Evaluation):  Psychosocial Re-Evaluation - 01/16/20 1143      Psychosocial Re-Evaluation   Current issues with None Identified    Comments Mr. Marcy continues to presents to cardiac rehab exercise session with a positive attitude and outlook. He admits to a very strong support system of famiy and friends. He enjoys yard work and Location manager. He denies psychosocial barriers to participation in CR and self health management. No interventions needed at this time.    Expected Outcomes Mr. Basey will continue to deny psychosocial barriers to participation in CR or self health management.    Interventions Encouraged to attend Cardiac Rehabilitation for the exercise    Continue Psychosocial Services  No Follow up required           Vocational Rehabilitation: Provide vocational rehab assistance to qualifying candidates.   Vocational Rehab Evaluation & Intervention:  Vocational Rehab - 12/28/19 1501      Initial Vocational Rehab Evaluation & Intervention   Assessment shows need for Vocational Rehabilitation No           Education: Education Goals: Education classes will be provided on a weekly basis, covering required topics. Participant will state understanding/return demonstration of topics presented.  Learning Barriers/Preferences:  Learning Barriers/Preferences - 12/28/19 1201      Learning Barriers/Preferences   Learning Barriers Sight    Learning Preferences Written Material;Skilled Demonstration;Individual Instruction           Education Topics: Count Your Pulse:  -Group instruction provided by verbal instruction, demonstration, patient participation and written materials to support subject.  Instructors address importance of being able to find your pulse and how to count your  pulse when at home without a heart monitor.  Patients get hands on experience counting their pulse with staff help and individually.   Heart Attack, Angina, and Risk Factor Modification:  -Group instruction provided by verbal instruction, video, and written materials to support subject.  Instructors address signs and symptoms of angina and heart attacks.    Also discuss risk factors for heart disease and how to make changes to improve heart health risk factors.   Functional Fitness:  -Group instruction provided by verbal instruction, demonstration, patient participation, and written materials to support subject.  Instructors address safety measures for doing things around the house.  Discuss how to get up and down off the floor, how to pick things up properly, how to safely get out of a chair without assistance, and balance training.   Meditation and Mindfulness:  -Group instruction provided by verbal instruction, patient participation, and written materials to support subject.  Instructor addresses importance of mindfulness and meditation practice to help reduce stress and improve awareness.  Instructor also leads participants through a meditation exercise.  Stretching for Flexibility and Mobility:  -Group instruction provided by verbal instruction, patient participation, and written materials to support subject.  Instructors lead participants through series of stretches that are designed to increase flexibility thus improving mobility.  These stretches are additional exercise for major muscle groups that are typically performed during regular warm up and cool down.   Hands Only CPR:  -Group verbal, video, and participation provides a basic overview of AHA guidelines for community CPR. Role-play of emergencies allow participants the opportunity to practice calling for help and chest compression technique with discussion of AED use.   Hypertension: -Group verbal and written instruction that  provides a basic overview of hypertension including the most recent diagnostic guidelines, risk factor reduction with self-care instructions and medication management.    Nutrition I class: Heart Healthy Eating:  -Group instruction provided by PowerPoint slides, verbal discussion, and written materials to support subject matter. The instructor gives an explanation and review of the Therapeutic Lifestyle Changes diet recommendations, which includes a discussion on lipid goals, dietary fat, sodium, fiber, plant stanol/sterol esters, sugar, and the components of a well-balanced, healthy diet.   Nutrition II class: Lifestyle Skills:  -Group instruction provided by PowerPoint slides, verbal discussion, and written materials to support subject matter. The instructor gives an explanation and review of label reading, grocery shopping for heart health, heart healthy recipe modifications, and ways to make healthier choices when eating out.   Diabetes Question & Answer:  -Group instruction provided by PowerPoint slides, verbal discussion, and written materials to support subject matter. The instructor gives an explanation and review of diabetes co-morbidities, pre- and post-prandial blood glucose goals, pre-exercise blood glucose goals, signs, symptoms, and treatment of hypoglycemia and hyperglycemia, and foot care basics.   Diabetes Blitz:  -Group instruction provided by PowerPoint slides, verbal discussion, and written materials to support subject matter. The instructor gives an explanation and review of the physiology behind type 1 and type 2 diabetes, diabetes medications and rational behind using different medications, pre- and post-prandial blood glucose recommendations and Hemoglobin A1c goals, diabetes diet, and exercise including blood glucose guidelines for exercising safely.    Portion Distortion:  -Group instruction provided by PowerPoint slides, verbal discussion, written materials, and food  models to support subject matter. The instructor gives an explanation of serving size versus portion size, changes in portions sizes over the last 20 years, and what consists of a serving from each food group.   Stress Management:  -Group instruction provided by verbal instruction, video, and written materials to support subject matter.  Instructors review role of stress in heart disease and how to cope with stress positively.     Exercising on Your Own:  -Group instruction provided by verbal instruction, power point, and written materials to support subject.  Instructors discuss benefits of exercise, components of exercise, frequency and intensity of exercise, and end points for exercise.  Also discuss use of nitroglycerin and activating EMS.  Review options of places to exercise outside of rehab.  Review guidelines for sex with heart disease.   Cardiac Drugs I:  -Group instruction provided by verbal instruction and written materials to support subject.  Instructor reviews cardiac drug classes: antiplatelets, anticoagulants, beta blockers, and statins.  Instructor discusses reasons, side effects, and lifestyle considerations for each drug class.   Cardiac Drugs II:  -Group instruction provided by verbal instruction and written materials to support subject.  Instructor reviews cardiac drug classes: angiotensin converting enzyme inhibitors (ACE-I), angiotensin II receptor blockers (ARBs), nitrates,  and calcium channel blockers.  Instructor discusses reasons, side effects, and lifestyle considerations for each drug class.   Anatomy and Physiology of the Circulatory System:  Group verbal and written instruction and models provide basic cardiac anatomy and physiology, with the coronary electrical and arterial systems. Review of: AMI, Angina, Valve disease, Heart Failure, Peripheral Artery Disease, Cardiac Arrhythmia, Pacemakers, and the ICD.   Other Education:  -Group or individual verbal,  written, or video instructions that support the educational goals of the cardiac rehab program.   Holiday Eating Survival Tips:  -Group instruction provided by PowerPoint slides, verbal discussion, and written materials to support subject matter. The instructor gives patients tips, tricks, and techniques to help them not only survive but enjoy the holidays despite the onslaught of food that accompanies the holidays.   Knowledge Questionnaire Score:  Knowledge Questionnaire Score - 12/28/19 1158      Knowledge Questionnaire Score   Pre Score 21/24           Core Components/Risk Factors/Patient Goals at Admission:  Personal Goals and Risk Factors at Admission - 12/28/19 1502      Core Components/Risk Factors/Patient Goals on Admission    Weight Management Yes;Obesity    Intervention Weight Management: Develop a combined nutrition and exercise program designed to reach desired caloric intake, while maintaining appropriate intake of nutrient and fiber, sodium and fats, and appropriate energy expenditure required for the weight goal.;Weight Management: Provide education and appropriate resources to help participant work on and attain dietary goals.;Weight Management/Obesity: Establish reasonable short term and long term weight goals.    Expected Outcomes Short Term: Continue to assess and modify interventions until short term weight is achieved;Long Term: Adherence to nutrition and physical activity/exercise program aimed toward attainment of established weight goal;Weight Loss: Understanding of general recommendations for a balanced deficit meal plan, which promotes 1-2 lb weight loss per week and includes a negative energy balance of [id number] kcal/d;Understanding recommendations for meals to include 15-35% energy as protein, 25-35% energy from fat, 35-60% energy from carbohydrates, less than 200mg  of dietary cholesterol, 20-35 gm of total fiber daily;Understanding of distribution of calorie  intake throughout the day with the consumption of 4-5 meals/snacks    Diabetes Yes    Intervention Provide education about signs/symptoms and action to take for hypo/hyperglycemia.;Provide education about proper nutrition, including hydration, and aerobic/resistive exercise prescription along with prescribed medications to achieve blood glucose in normal ranges: Fasting glucose 65-99 mg/dL    Expected Outcomes Short Term: Participant verbalizes understanding of the signs/symptoms and immediate care of hyper/hypoglycemia, proper foot care and importance of medication, aerobic/resistive exercise and nutrition plan for blood glucose control.;Long Term: Attainment of HbA1C < 7%.    Hypertension Yes    Intervention Provide education on lifestyle modifcations including regular physical activity/exercise, weight management, moderate sodium restriction and increased consumption of fresh fruit, vegetables, and low fat dairy, alcohol moderation, and smoking cessation.;Monitor prescription use compliance.    Expected Outcomes Short Term: Continued assessment and intervention until BP is < 140/38mm HG in hypertensive participants. < 130/36mm HG in hypertensive participants with diabetes, heart failure or chronic kidney disease.;Long Term: Maintenance of blood pressure at goal levels.    Lipids Yes    Intervention Provide education and support for participant on nutrition & aerobic/resistive exercise along with prescribed medications to achieve LDL 70mg , HDL >40mg .    Expected Outcomes Short Term: Participant states understanding of desired cholesterol values and is compliant with medications prescribed. Participant is following exercise prescription and  nutrition guidelines.;Long Term: Cholesterol controlled with medications as prescribed, with individualized exercise RX and with personalized nutrition plan. Value goals: LDL < 70mg , HDL > 40 mg.           Core Components/Risk Factors/Patient Goals Review:    Goals and Risk Factor Review    Row Name 01/01/20 1610 01/16/20 1144           Core Components/Risk Factors/Patient Goals Review   Personal Goals Review Weight Management/Obesity;Lipids;Diabetes;Hypertension Weight Management/Obesity;Lipids;Diabetes;Hypertension      Review Mr. Hankerson has multiple CAD risk factors. He is eager to participate in CR for risk factor modification through exercise and education. His goals are to increase his overall health and heart strength Mr. Knoblock has multiple CAD risk factors. He is eager to participate in CR for risk factor modification through exercise and education. His goals are to increase his overall health and heart strength. Too soon in the program to evaluate progression towards goals.      Expected Outcomes Patient will continue to participate in CR for risk factor modification Patient will continue to participate in CR for risk factor modification             Core Components/Risk Factors/Patient Goals at Discharge (Final Review):   Goals and Risk Factor Review - 01/16/20 1144      Core Components/Risk Factors/Patient Goals Review   Personal Goals Review Weight Management/Obesity;Lipids;Diabetes;Hypertension    Review Mr. Forcier has multiple CAD risk factors. He is eager to participate in CR for risk factor modification through exercise and education. His goals are to increase his overall health and heart strength. Too soon in the program to evaluate progression towards goals.    Expected Outcomes Patient will continue to participate in CR for risk factor modification           ITP Comments:  ITP Comments    Row Name 12/28/19 1144 01/01/20 1601 01/16/20 1139       ITP Comments Dr. Fransico Him, Medical Director Mr. Dahm completed his first cardiac rehab exercise session today and tolerated very well. Denied complaints. VSS. CBG post exercise 85. Patient was provided snack of peanut butter and graham crackers. Worked to an RPE of 11-13.  Mr. Lemmerman personal program goals are to increase his overall health and heart strength. 30 day ITP review: Mr. Sanjuan has attended 3 cardiac rehab exercise sessions since admission. VS remain stable during exercise. Denies complaints. Works to an RPE of 11-12. Personal goals are to increase his overall health. Although Mr. Vallone is doing very well in CR, should see greater progression towards goals over the next 30 days. Will continue to monitor.            Comments: see ITP comments

## 2020-01-17 ENCOUNTER — Encounter (HOSPITAL_COMMUNITY)
Admission: RE | Admit: 2020-01-17 | Discharge: 2020-01-17 | Disposition: A | Discharge status: Home / Self Care | Payer: Medicare HMO | Source: Ambulatory Visit | Attending: Cardiology | Admitting: Cardiology

## 2020-01-17 ENCOUNTER — Other Ambulatory Visit: Payer: Self-pay

## 2020-01-17 DIAGNOSIS — I2111 ST elevation (STEMI) myocardial infarction involving right coronary artery: Secondary | ICD-10-CM | POA: Diagnosis not present

## 2020-01-17 DIAGNOSIS — Z955 Presence of coronary angioplasty implant and graft: Secondary | ICD-10-CM

## 2020-01-18 NOTE — Progress Notes (Signed)
Reviewed home exercise Rx with patient today. Pt voices he is walking at home about 2x/week for 30 minutes. We discussed walking at home 2-3x/week for 30 minutes. Warm-up, cool-down and stretching discussed and encouraged. We reviewed THRR, and RPE scale to work between 11-13. Hydration was encouraged before, during and after exercise. Encouraged to carry phone when walking outdoors, Weather parameters for temperature and humidity discussed. Reviewed S/S of when to terminate  exercise and when to call MD vs 911. Pt verbalized understanding of education as was provided copy of the home exercise Rx.   Lesly Rubenstein MS, ACSM-EP-C, CCRP

## 2020-01-19 ENCOUNTER — Encounter (HOSPITAL_COMMUNITY): Payer: Medicare HMO

## 2020-01-22 ENCOUNTER — Other Ambulatory Visit: Payer: Self-pay | Admitting: Oncology

## 2020-01-22 ENCOUNTER — Other Ambulatory Visit: Payer: Self-pay

## 2020-01-22 ENCOUNTER — Encounter (HOSPITAL_COMMUNITY)
Admission: RE | Admit: 2020-01-22 | Discharge: 2020-01-22 | Disposition: A | Discharge status: Home / Self Care | Payer: Medicare HMO | Source: Ambulatory Visit | Attending: Cardiology | Admitting: Cardiology

## 2020-01-22 DIAGNOSIS — I2111 ST elevation (STEMI) myocardial infarction involving right coronary artery: Secondary | ICD-10-CM

## 2020-01-22 DIAGNOSIS — Z955 Presence of coronary angioplasty implant and graft: Secondary | ICD-10-CM | POA: Diagnosis not present

## 2020-01-23 ENCOUNTER — Other Ambulatory Visit: Payer: Self-pay

## 2020-01-23 MED ORDER — NITROGLYCERIN 0.4 MG SL SUBL
0.4000 mg | SUBLINGUAL_TABLET | SUBLINGUAL | 11 refills | Status: DC | PRN
Start: 2020-01-23 — End: 2021-05-27

## 2020-01-24 ENCOUNTER — Other Ambulatory Visit: Payer: Self-pay

## 2020-01-24 ENCOUNTER — Encounter (HOSPITAL_COMMUNITY)
Admission: RE | Admit: 2020-01-24 | Discharge: 2020-01-24 | Disposition: A | Discharge status: Home / Self Care | Payer: Medicare HMO | Source: Ambulatory Visit | Attending: Cardiology | Admitting: Cardiology

## 2020-01-24 DIAGNOSIS — I2111 ST elevation (STEMI) myocardial infarction involving right coronary artery: Secondary | ICD-10-CM | POA: Diagnosis not present

## 2020-01-24 DIAGNOSIS — Z955 Presence of coronary angioplasty implant and graft: Secondary | ICD-10-CM | POA: Diagnosis not present

## 2020-01-26 ENCOUNTER — Encounter (HOSPITAL_COMMUNITY): Payer: Medicare HMO

## 2020-01-29 ENCOUNTER — Encounter (HOSPITAL_COMMUNITY)
Admission: RE | Admit: 2020-01-29 | Discharge: 2020-01-29 | Disposition: A | Discharge status: Home / Self Care | Payer: Medicare HMO | Source: Ambulatory Visit | Attending: Cardiology | Admitting: Cardiology

## 2020-01-29 ENCOUNTER — Other Ambulatory Visit: Payer: Self-pay

## 2020-01-29 DIAGNOSIS — Z955 Presence of coronary angioplasty implant and graft: Secondary | ICD-10-CM | POA: Diagnosis not present

## 2020-01-29 DIAGNOSIS — I2111 ST elevation (STEMI) myocardial infarction involving right coronary artery: Secondary | ICD-10-CM | POA: Diagnosis not present

## 2020-01-31 ENCOUNTER — Other Ambulatory Visit: Payer: Self-pay

## 2020-01-31 ENCOUNTER — Encounter (HOSPITAL_COMMUNITY)
Admission: RE | Admit: 2020-01-31 | Discharge: 2020-01-31 | Disposition: A | Discharge status: Home / Self Care | Payer: Medicare HMO | Source: Ambulatory Visit | Attending: Cardiology | Admitting: Cardiology

## 2020-01-31 DIAGNOSIS — Z955 Presence of coronary angioplasty implant and graft: Secondary | ICD-10-CM

## 2020-01-31 DIAGNOSIS — I2111 ST elevation (STEMI) myocardial infarction involving right coronary artery: Secondary | ICD-10-CM | POA: Diagnosis not present

## 2020-02-02 ENCOUNTER — Encounter (HOSPITAL_COMMUNITY): Payer: Medicare HMO

## 2020-02-05 ENCOUNTER — Other Ambulatory Visit: Payer: Self-pay

## 2020-02-05 ENCOUNTER — Encounter (HOSPITAL_COMMUNITY)
Admission: RE | Admit: 2020-02-05 | Discharge: 2020-02-05 | Disposition: A | Discharge status: Home / Self Care | Payer: Medicare HMO | Source: Ambulatory Visit | Attending: Cardiology | Admitting: Cardiology

## 2020-02-05 DIAGNOSIS — I2111 ST elevation (STEMI) myocardial infarction involving right coronary artery: Secondary | ICD-10-CM | POA: Diagnosis not present

## 2020-02-05 DIAGNOSIS — Z955 Presence of coronary angioplasty implant and graft: Secondary | ICD-10-CM

## 2020-02-07 ENCOUNTER — Other Ambulatory Visit: Payer: Self-pay

## 2020-02-07 ENCOUNTER — Encounter (HOSPITAL_COMMUNITY)
Admission: RE | Admit: 2020-02-07 | Discharge: 2020-02-07 | Disposition: A | Discharge status: Home / Self Care | Payer: Medicare HMO | Source: Ambulatory Visit | Attending: Cardiology | Admitting: Cardiology

## 2020-02-07 DIAGNOSIS — Z955 Presence of coronary angioplasty implant and graft: Secondary | ICD-10-CM | POA: Diagnosis not present

## 2020-02-07 DIAGNOSIS — I2111 ST elevation (STEMI) myocardial infarction involving right coronary artery: Secondary | ICD-10-CM | POA: Diagnosis not present

## 2020-02-09 ENCOUNTER — Encounter (HOSPITAL_COMMUNITY): Payer: Medicare HMO

## 2020-02-12 ENCOUNTER — Encounter (HOSPITAL_COMMUNITY): Payer: Medicare HMO

## 2020-02-14 ENCOUNTER — Encounter (HOSPITAL_COMMUNITY)
Admission: RE | Admit: 2020-02-14 | Discharge: 2020-02-14 | Disposition: A | Discharge status: Home / Self Care | Payer: Medicare HMO | Source: Ambulatory Visit | Attending: Cardiology | Admitting: Cardiology

## 2020-02-14 ENCOUNTER — Other Ambulatory Visit: Payer: Self-pay

## 2020-02-14 DIAGNOSIS — Z955 Presence of coronary angioplasty implant and graft: Secondary | ICD-10-CM | POA: Diagnosis not present

## 2020-02-14 DIAGNOSIS — I2111 ST elevation (STEMI) myocardial infarction involving right coronary artery: Secondary | ICD-10-CM

## 2020-02-15 NOTE — Progress Notes (Signed)
Cardiac Individual Treatment Plan  Patient Details  Name: Alan Morrison MRN: [id number] Date of Birth: [birth year] Referring Provider:     CARDIAC REHAB PHASE II ORIENTATION from 12/28/2019 in [place]  Referring Provider Martinique, Peter MD      Initial Encounter Date:    CARDIAC REHAB PHASE II ORIENTATION from 12/28/2019 in [place]  Date 12/28/19      Visit Diagnosis: ST elevation myocardial infarction involving right coronary artery ([place]) 11/12/19  S/P DES RCA 11/12/19  Patient's Home Medications on Admission:  Current Outpatient Medications:  .  acetaminophen (TYLENOL) 500 MG tablet, Take 1,000 mg by mouth 2 (two) times daily as needed for headache (pain)., Disp: , Rfl:  .  aspirin EC 81 MG tablet, Take 81 mg by mouth daily with supper. Swallow whole., Disp: , Rfl:  .  Calcium Carbonate Antacid (TUMS PO), Take 1 tablet by mouth daily as needed (acid reflux/indigestion)., Disp: , Rfl:  .  Cholecalciferol (VITAMIN D) 2000 units tablet, Take 2,000 Units by mouth daily with breakfast. , Disp: , Rfl:  .  clopidogrel (PLAVIX) 75 MG tablet, Take 1 tablet (75 mg total) by mouth daily with breakfast., Disp: 90 tablet, Rfl: 3 .  finasteride (PROSCAR) 5 MG tablet, Take 1 tablet (5 mg total) by mouth daily., Disp: 90 tablet, Rfl: 3 .  Insulin Glargine (BASAGLAR KWIKPEN) 100 UNIT/ML, Inject 0.4 mLs (40 Units total) into the skin at bedtime., Disp: , Rfl:  .  Insulin Pen Needle (B-D ULTRAFINE III SHORT PEN) 31G X 8 MM MISC, 1 Device by Other route 4 (four) times daily. USE AS DIRECTED DAILY, Disp: 120 each, Rfl: 11 .  losartan (COZAAR) 25 MG tablet, Take 1 tablet (25 mg total) by mouth daily., Disp: 90 tablet, Rfl: 3 .  metFORMIN (GLUCOPHAGE) 1000 MG tablet, Take 1,000 mg by mouth daily with breakfast., Disp: , Rfl:  .  metoprolol tartrate (LOPRESSOR) 25 MG tablet, TAKE 0.5 TABLETS (12.5 MG TOTAL) BY MOUTH 2 (TWO) TIMES DAILY., Disp:  90 tablet, Rfl: 3 .  Multiple Vitamin (MULTIVITAMIN WITH MINERALS) TABS tablet, Take 1 tablet by mouth daily with breakfast. , Disp: , Rfl:  .  mupirocin cream (BACTROBAN) 2 %, Apply 1 application topically 3 (three) times daily. (Patient not taking: Reported on 12/26/2019), Disp: 30 g, Rfl: 0 .  nitroGLYCERIN (NITROSTAT) 0.4 MG SL tablet, Place 1 tablet (0.4 mg total) under the tongue every 5 (five) minutes as needed for chest pain., Disp: 25 tablet, Rfl: 11 .  ONETOUCH ULTRA test strip, USE TO CHECK SUGARS DAILY  AS DIRECTED, Disp: 100 strip, Rfl: 3 .  probenecid (BENEMID) 500 MG tablet, Take 0.5 tablets (250 mg total) by mouth every other day., Disp: 45 tablet, Rfl: 3 .  rosuvastatin (CRESTOR) 40 MG tablet, Take 1 tablet (40 mg total) by mouth daily with supper., Disp: 90 tablet, Rfl: 3 .  tamsulosin (FLOMAX) 0.4 MG CAPS capsule, Take 1 capsule (0.4 mg total) by mouth daily., Disp: 90 capsule, Rfl: 3 .  vitamin C (ASCORBIC ACID) 500 MG tablet, Take 500 mg by mouth daily with supper. , Disp: , Rfl:  .  XARELTO 10 MG TABS tablet, TAKE 1 TABLET BY MOUTH EVERY DAY, Disp: 90 tablet, Rfl: 2  Past Medical History: Past Medical History:  Diagnosis Date  . Anemia, iron deficiency, inadequate dietary intake   . 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   by [place]  . CAD (coronary artery  disease) 2003   cath - Min Dz, EF 65%, Adm.R/O'd  . Cervical spondylosis without myelopathy   . Chest pain, atypical   . Choroidal nevus 01/22/2011   left, yearly eye exam, no diabetic retinopathy  . Diabetes mellitus type II   . Dyspepsia   . Ex-smoker   . Fatty liver 02/2016   by [place]  . GERD (gastroesophageal reflux disease)   . Headache(784.0)   . Heart murmur    Hx of  . History of MRI of brain and brain stem 09/06  . History of MRSA infection 12/2013   R knee boil  . HLD (hyperlipidemia)   . Hyperuricemia   . Obesity   . Sleep apnea   . Stress-induced cardiomyopathy 09/27/98   WNL, EF 64%  . Urosepsis  01/01-01/05/10   Hospitalization    Tobacco Use: Social History   Tobacco Use  Smoking Status Former Smoker  . Quit date: 04/20/1974  . Years since quitting: 45.8  Smokeless Tobacco Never Used  Tobacco Comment   quit over 20 years    Labs: Recent Review Flowsheet Data    Labs for ITP Cardiac and Pulmonary Rehab Latest Ref Rng & Units 01/26/2019 05/26/2019 06/19/2019 11/12/2019 11/13/2019   Cholestrol 0 - 200 mg/dL 94 92 - 94 -   LDLCALC 0 - 99 mg/dL 47 42 - 47 -   HDL >40 mg/dL 29.90(L) 28.30(L) - 35(L) -   Trlycerides <150 mg/dL 85.0 112.0 - 60 -   Hemoglobin A1c 4.8 - 5.6 % 7.2(H) 10.4(H) 8.8(A) 6.3(H) 6.3(H)   TCO2 0 - 100 mmol/L - - - - -      Capillary Blood Glucose: Lab Results  Component Value Date   GLUCAP 83 01/08/2020   GLUCAP 117 (H) 01/03/2020   GLUCAP 142 (H) 01/03/2020   GLUCAP 85 01/01/2020   GLUCAP 121 (H) 11/14/2019     Exercise Target Goals: Exercise Program Goal: Individual exercise prescription set using results from initial 6 min walk test and THRR while considering  patient's activity barriers and safety.   Exercise Prescription Goal: Initial exercise prescription builds to 30-45 minutes a day of aerobic activity, 2-3 days per week.  Home exercise guidelines will be given to patient during program as part of exercise prescription that the participant will acknowledge.  Activity Barriers & Risk Stratification:  Activity Barriers & Cardiac Risk Stratification - 12/28/19 1153      Activity Barriers & Cardiac Risk Stratification   Activity Barriers Joint Problems    Cardiac Risk Stratification High           6 Minute Walk:  6 Minute Walk    Row Name 12/28/19 1144         6 Minute Walk   Phase Initial     Distance 1491 feet     Walk Time 6 minutes     # of Rest Breaks 0     MPH 2.8     METS 3.02     RPE 12     Perceived Dyspnea  0     VO2 Peak 10.6     Symptoms No     Resting HR 67 bpm     Resting BP 104/72     Resting Oxygen  Saturation  97 %     Exercise Oxygen Saturation  during 6 min walk 97 %     Max Ex. HR 92 bpm     Max Ex. BP 120/80     2 Minute  Post BP 106/70            Oxygen Initial Assessment:   Oxygen Re-Evaluation:   Oxygen Discharge (Final Oxygen Re-Evaluation):   Initial Exercise Prescription:  Initial Exercise Prescription - 12/28/19 1200      Date of Initial Exercise RX and Referring Provider   Date 12/28/19    Referring Provider Martinique, Peter MD    Expected Discharge Date 02/23/20      Recumbant Bike   Level 1.5    Watts 15    Minutes 15    METs 2.5      NuStep   Level 2    SPM 80    Minutes 15    METs 2.5      Prescription Details   Frequency (times per week) 3x    Duration Progress to 10 minutes continuous walking  at current work load and total walking time to 30-45 min      Intensity   THRR 40-80% of Max Heartrate 61-122    Ratings of Perceived Exertion 11-13    Perceived Dyspnea 0-4      Progression   Progression Continue progressive overload as per policy without signs/symptoms or physical distress.      Resistance Training   Training Prescription Yes    Weight 3lbs    Reps 10-15           Perform Capillary Blood Glucose checks as needed.  Exercise Prescription Changes:  Exercise Prescription Changes    Row Name 01/01/20 1400 01/15/20 1500 01/17/20 1620 01/29/20 1425 02/14/20 1500     Response to Exercise   Blood Pressure (Admit) 108/58 118/78 98/62 118/76 90/60   Blood Pressure (Exercise) 126/70 132/72 118/74 128/70 124/70   Blood Pressure (Exit) 106/71 104/60 104/70 98/64 110/60   Heart Rate (Admit) 82 bpm 84 bpm 85 bpm 83 bpm 88 bpm   Heart Rate (Exercise) 105 bpm 128 bpm 123 bpm 107 bpm 118 bpm   Heart Rate (Exit) 93 bpm 78 bpm 80 bpm 83 bpm 98 bpm   Rating of Perceived Exertion (Exercise) 11 12 12 12 12    Symptoms None None None None None   Comments Pt's first day of exercise in the CRP2 program. Reviewed METs. Reviewed Home execise  program Reviewed METs and Goals Reviewed Mets and Goals   Duration Progress to 30 minutes of  aerobic without signs/symptoms of physical distress Progress to 30 minutes of  aerobic without signs/symptoms of physical distress Continue with 30 min of aerobic exercise without signs/symptoms of physical distress. Continue with 30 min of aerobic exercise without signs/symptoms of physical distress. Continue with 30 min of aerobic exercise without signs/symptoms of physical distress.   Intensity THRR unchanged THRR unchanged THRR unchanged THRR unchanged THRR unchanged     Progression   Progression Continue to progress workloads to maintain intensity without signs/symptoms of physical distress. Continue to progress workloads to maintain intensity without signs/symptoms of physical distress. Continue to progress workloads to maintain intensity without signs/symptoms of physical distress. Continue to progress workloads to maintain intensity without signs/symptoms of physical distress. Continue to progress workloads to maintain intensity without signs/symptoms of physical distress.   Average METs 2.4 2.95 3.15 3.1 3     Resistance Training   Training Prescription Yes Yes No Yes No   Weight 3 lbs 3 lbs --  No free weights on Wednesday 3 lbs --  No weights on Wednesdays   Reps 10-15 10-15 -- 10-15 --   Time 10  Minutes 10 Minutes -- 10 Minutes --     Interval Training   Interval Training No No No No No     Recumbant Bike   Level 1.5 2 2  2.4 3   Minutes 15 15 15 15 15    METs 2.3 2.7 2.8 2.9 2.9     NuStep   Level 2 3 3 4 4    SPM 80 85 85 85 85   Minutes 15 15 15 15 15    METs 2.5 3.2 3.5 3.2 3.1     Home Exercise Plan   Plans to continue exercise at -- -- Home (comment) Home (comment) Home (comment)   Frequency -- -- Add 2 additional days to program exercise sessions. Add 2 additional days to program exercise sessions. Add 2 additional days to program exercise sessions.   Initial Home Exercises  Provided -- -- 01/17/20 01/17/20 01/17/20          Exercise Comments:  Exercise Comments    Row Name 01/01/20 1446 01/15/20 1445 01/29/20 1430 02/14/20 1500     Exercise Comments Pt's first day of exericse in the CRP2 program. Pt tolerated session well with no complaints. Reviewed METs. Pt is making good progress in the CRP2 program and is tolerating the exercise well with no complaints. Will continue to monitor and progress as tolerated. Reviewed METs and goals. Making good progress in the CRP2 program. Reviewed METs and goals. Making good progress in the CRP2 program.           Exercise Goals and Review:  Exercise Goals    Row Name 12/28/19 1153             Exercise Goals   Increase Physical Activity Yes       Intervention Provide advice, education, support and counseling about physical activity/exercise needs.;Develop an individualized exercise prescription for aerobic and resistive training based on initial evaluation findings, risk stratification, comorbidities and participant's personal goals.       Expected Outcomes Short Term: Attend rehab on a regular basis to increase amount of physical activity.;Long Term: Add in home exercise to make exercise part of routine and to increase amount of physical activity.;Long Term: Exercising regularly at least 3-5 days a week.       Increase Strength and Stamina Yes       Intervention Provide advice, education, support and counseling about physical activity/exercise needs.;Develop an individualized exercise prescription for aerobic and resistive training based on initial evaluation findings, risk stratification, comorbidities and participant's personal goals.       Expected Outcomes Short Term: Increase workloads from initial exercise prescription for resistance, speed, and METs.;Short Term: Perform resistance training exercises routinely during rehab and add in resistance training at home;Long Term: Improve cardiorespiratory fitness,  muscular endurance and strength as measured by increased METs and functional capacity (6MWT)       Able to understand and use rate of perceived exertion (RPE) scale Yes       Intervention Provide education and explanation on how to use RPE scale       Expected Outcomes Short Term: Able to use RPE daily in rehab to express subjective intensity level;Long Term:  Able to use RPE to guide intensity level when exercising independently       Knowledge and understanding of Target Heart Rate Range (THRR) Yes       Intervention Provide education and explanation of THRR including how the numbers were predicted and where they are located for reference  Expected Outcomes Short Term: Able to state/look up THRR;Long Term: Able to use THRR to govern intensity when exercising independently;Short Term: Able to use daily as guideline for intensity in rehab       Able to check pulse independently Yes       Intervention Provide education and demonstration on how to check pulse in carotid and radial arteries.;Review the importance of being able to check your own pulse for safety during independent exercise       Expected Outcomes Long Term: Able to check pulse independently and accurately;Short Term: Able to explain why pulse checking is important during independent exercise       Understanding of Exercise Prescription Yes       Intervention Provide education, explanation, and written materials on patient's individual exercise prescription       Expected Outcomes Short Term: Able to explain program exercise prescription;Long Term: Able to explain home exercise prescription to exercise independently              Exercise Goals Re-Evaluation :  Exercise Goals Re-Evaluation    Row Name 01/01/20 1445 01/17/20 1624 01/29/20 1445 01/31/20 0846 02/14/20 1500     Exercise Goal Re-Evaluation   Exercise Goals Review Increase Physical Activity;Increase Strength and Stamina;Able to understand and use rate of perceived  exertion (RPE) scale;Knowledge and understanding of Target Heart Rate Range (THRR);Able to check pulse independently;Understanding of Exercise Prescription Increase Physical Activity;Increase Strength and Stamina;Able to understand and use rate of perceived exertion (RPE) scale;Knowledge and understanding of Target Heart Rate Range (THRR);Able to check pulse independently;Understanding of Exercise Prescription Increase Physical Activity;Increase Strength and Stamina;Able to understand and use rate of perceived exertion (RPE) scale;Knowledge and understanding of Target Heart Rate Range (THRR);Able to check pulse independently;Understanding of Exercise Prescription -- Increase Physical Activity;Increase Strength and Stamina;Able to understand and use rate of perceived exertion (RPE) scale;Knowledge and understanding of Target Heart Rate Range (THRR);Able to check pulse independently;Understanding of Exercise Prescription   Comments Pt's first day of exercise in the CRP2 program. Pt tolerated exercise well and understands exercise Rx, RPE scale and THRR. Reviewd Home execise Rx today with patient. Pt encouraged wo walk 2-3 x/week at home in addtion to his CRP2 exercise program. Pt agrees. Pt verbalized understanding of the home exercise Rx and was provided copy. Pt is making good progress in the CRP2 program. Pt is walking at home 2-3x/wek for 30-45 minutes. -- Reviewed METs and Goals. Pt continues to progress without complaints.   Expected Outcomes Will continue to monitor patient reposnse to exercise and progress as tolerated. Pt will walk at home 2-3x/week for 30-45 minutes. Will continue to monitor and progress as tolerated. -- Will continue to monitor and progress as tolerated.          Discharge Exercise Prescription (Final Exercise Prescription Changes):  Exercise Prescription Changes - 02/14/20 1500      Response to Exercise   Blood Pressure (Admit) 90/60    Blood Pressure (Exercise) 124/70     Blood Pressure (Exit) 110/60    Heart Rate (Admit) 88 bpm    Heart Rate (Exercise) 118 bpm    Heart Rate (Exit) 98 bpm    Rating of Perceived Exertion (Exercise) 12    Symptoms None    Comments Reviewed Mets and Goals    Duration Continue with 30 min of aerobic exercise without signs/symptoms of physical distress.    Intensity THRR unchanged      Progression   Progression Continue to progress  workloads to maintain intensity without signs/symptoms of physical distress.    Average METs 3      Resistance Training   Training Prescription No    Weight --   No weights on Wednesdays     Interval Training   Interval Training No      Recumbant Bike   Level 3    Minutes 15    METs 2.9      NuStep   Level 4    SPM 85    Minutes 15    METs 3.1      Home Exercise Plan   Plans to continue exercise at Home (comment)    Frequency Add 2 additional days to program exercise sessions.    Initial Home Exercises Provided 01/17/20           Nutrition:  Target Goals: Understanding of nutrition guidelines, daily intake of sodium 1500mg , cholesterol 200mg , calories 30% from fat and 7% or less from saturated fats, daily to have 5 or more servings of fruits and vegetables.  Biometrics:  Pre Biometrics - 12/28/19 1154      Pre Biometrics   Height 5' 7.5" (1.715 m)    Weight 89.7 kg    Waist Circumference 44.5 inches    Hip Circumference 45 inches    Waist to Hip Ratio 0.99 %    BMI (Calculated) 30.5    Triceps Skinfold 20 mm    % Body Fat 32 %    Grip Strength 31 kg    Flexibility 11 in    Single Leg Stand 30 seconds            Nutrition Therapy Plan and Nutrition Goals:  Nutrition Therapy & Goals - 01/03/20 1427      Nutrition Therapy   Diet Heart Healthy    Drug/Food Interactions Statins/Certain Fruits      Personal Nutrition Goals   Nutrition Goal Pt to identify food quantities necessary to achieve weight loss of 6-24 lb at graduation from cardiac rehab.    Personal  Goal #2 Pt to build a healthy plate including vegetables, fruits, whole grains, and low-fat dairy products in a heart healthy meal plan    Personal Goal #3 Pt to incorporate protein shake      Intervention Plan   Intervention Prescribe, educate and counsel regarding individualized specific dietary modifications aiming towards targeted core components such as weight, hypertension, lipid management, diabetes, heart failure and other comorbidities.;Nutrition handout(s) given to patient.    Expected Outcomes Short Term Goal: Understand basic principles of dietary content, such as calories, fat, sodium, cholesterol and nutrients.           Nutrition Assessments:  Nutrition Assessments - 01/09/20 1022      MEDFICTS Scores   Pre Score 0           Nutrition Goals Re-Evaluation:  Nutrition Goals Re-Evaluation    [place] Name 01/03/20 1428 01/16/20 0902 02/14/20 1317         Goals   Current Weight 197 lb (89.4 kg) 198 lb 6.6 oz (90 kg) 196 lb 10.4 oz (89.2 kg)     Nutrition Goal Pt to identify food quantities necessary to achieve weight loss of 6-24 lb at graduation from cardiac rehab. Pt to identify food quantities necessary to achieve weight loss of 6-24 lb at graduation from cardiac rehab. Pt to identify food quantities necessary to achieve weight loss of 6-24 lb at graduation from cardiac rehab.     Comment -- --  Weight lost 2 lbs in past 4 weeks       Personal Goal #2 Re-Evaluation   Personal Goal #2 Pt to build a healthy plate including vegetables, fruits, whole grains, and low-fat dairy products in a heart healthy meal plan Pt to build a healthy plate including vegetables, fruits, whole grains, and low-fat dairy products in a heart healthy meal plan Pt to build a healthy plate including vegetables, fruits, whole grains, and low-fat dairy products in a heart healthy meal plan       Personal Goal #3 Re-Evaluation   Personal Goal #3 Pt to incorporate protein shake Pt to incorporate protein  shake Pt to incorporate protein shake            Nutrition Goals Re-Evaluation:  Nutrition Goals Re-Evaluation    [place] Name 01/03/20 1428 01/16/20 0902 02/14/20 1317         Goals   Current Weight 197 lb (89.4 kg) 198 lb 6.6 oz (90 kg) 196 lb 10.4 oz (89.2 kg)     Nutrition Goal Pt to identify food quantities necessary to achieve weight loss of 6-24 lb at graduation from cardiac rehab. Pt to identify food quantities necessary to achieve weight loss of 6-24 lb at graduation from cardiac rehab. Pt to identify food quantities necessary to achieve weight loss of 6-24 lb at graduation from cardiac rehab.     Comment -- -- Weight lost 2 lbs in past 4 weeks       Personal Goal #2 Re-Evaluation   Personal Goal #2 Pt to build a healthy plate including vegetables, fruits, whole grains, and low-fat dairy products in a heart healthy meal plan Pt to build a healthy plate including vegetables, fruits, whole grains, and low-fat dairy products in a heart healthy meal plan Pt to build a healthy plate including vegetables, fruits, whole grains, and low-fat dairy products in a heart healthy meal plan       Personal Goal #3 Re-Evaluation   Personal Goal #3 Pt to incorporate protein shake Pt to incorporate protein shake Pt to incorporate protein shake            Nutrition Goals Discharge (Final Nutrition Goals Re-Evaluation):  Nutrition Goals Re-Evaluation - 02/14/20 1317      Goals   Current Weight 196 lb 10.4 oz (89.2 kg)    Nutrition Goal Pt to identify food quantities necessary to achieve weight loss of 6-24 lb at graduation from cardiac rehab.    Comment Weight lost 2 lbs in past 4 weeks      Personal Goal #2 Re-Evaluation   Personal Goal #2 Pt to build a healthy plate including vegetables, fruits, whole grains, and low-fat dairy products in a heart healthy meal plan      Personal Goal #3 Re-Evaluation   Personal Goal #3 Pt to incorporate protein shake           Psychosocial: Target  Goals: Acknowledge presence or absence of significant depression and/or stress, maximize coping skills, provide positive support system. Participant is able to verbalize types and ability to use techniques and skills needed for reducing stress and depression.  Initial Review & Psychosocial Screening:  Initial Psych Review & Screening - 12/28/19 1459      Initial Review   Current issues with None Identified      Family Dynamics   Good Support System? Yes   Kasandra Knudsen has his wife for support     Barriers   Psychosocial barriers to participate in program  There are no identifiable barriers or psychosocial needs.      Screening Interventions   Interventions Encouraged to exercise           Quality of Life Scores:  Quality of Life - 12/28/19 1158      Quality of Life   Select Quality of Life      Quality of Life Scores   Health/Function Pre 25.2 %    Socioeconomic Pre 26.43 %    Psych/Spiritual Pre 28.29 %    Family Pre 28.8 %    GLOBAL Pre 26.62 %          Scores of 19 and below usually indicate a poorer quality of life in these areas.  A difference of  2-3 points is a clinically meaningful difference.  A difference of 2-3 points in the total score of the Quality of Life Index has been associated with significant improvement in overall quality of life, self-image, physical symptoms, and general health in studies assessing change in quality of life.  PHQ-9: Recent Review Flowsheet Data    Depression screen [facility] 2/9 12/28/2019 05/31/2019 02/02/2019 10/18/2017 02/11/2017   Decreased Interest 0 0 0 0 0   Down, Depressed, Hopeless 0 1 0 0 0   PHQ - 2 Score 0 1 0 0 0   Altered sleeping - 2 - - -   Tired, decreased energy - 3 - - -   Change in appetite - 0 - - -   Feeling bad or failure about yourself  - 0 - - -   Trouble concentrating - 0 - - -   Moving slowly or fidgety/restless - 0 - - -   Suicidal thoughts - 0 - - -   PHQ-9 Score - 6 - - -     Interpretation of Total Score   Total Score Depression Severity:  1-4 = Minimal depression, 5-9 = Mild depression, 10-14 = Moderate depression, 15-19 = Moderately severe depression, 20-27 = Severe depression   Psychosocial Evaluation and Intervention:  Psychosocial Evaluation - 01/01/20 1608      Psychosocial Evaluation & Interventions   Interventions Encouraged to exercise with the program and follow exercise prescription    Comments Mr. Postle presents to his first cardiac rehab exercise session with a positive attitude and outlook. He admits to a very strong support system of famiy and friends. He enjoys yard work and Location manager. He denies psychosocial barriers to participation in CR and self health management. No interventions needed at this time.    Expected Outcomes Mr. Corbitt will continue to have a positive attitude and outlook.    Continue Psychosocial Services  No Follow up required           Psychosocial Re-Evaluation:  Psychosocial Re-Evaluation    [place] Name 01/16/20 1143 01/30/20 0936           Psychosocial Re-Evaluation   Current issues with None Identified --      Comments Mr. Riojas continues to presents to cardiac rehab exercise session with a positive attitude and outlook. He admits to a very strong support system of famiy and friends. He enjoys yard work and Location manager. He denies psychosocial barriers to participation in CR and self health management. No interventions needed at this time. Mr. Alabi continues to presents to cardiac rehab exercise session with a positive attitude and outlook. He admits to a very strong support system of famiy and friends. He enjoys yard work and Location manager. He denies psychosocial barriers to participation  in CR and self health management. No interventions needed at this time.      Expected Outcomes Mr. Fread will continue to deny psychosocial barriers to participation in CR or self health management. Mr. Reiland will continue to deny psychosocial barriers to participation in CR  or self health management.      Interventions Encouraged to attend Cardiac Rehabilitation for the exercise Encouraged to attend Cardiac Rehabilitation for the exercise      Continue Psychosocial Services  No Follow up required No Follow up required             Psychosocial Discharge (Final Psychosocial Re-Evaluation):  Psychosocial Re-Evaluation - 01/30/20 0936      Psychosocial Re-Evaluation   Comments Mr. Robles continues to presents to cardiac rehab exercise session with a positive attitude and outlook. He admits to a very strong support system of famiy and friends. He enjoys yard work and Location manager. He denies psychosocial barriers to participation in CR and self health management. No interventions needed at this time.    Expected Outcomes Mr. Schack will continue to deny psychosocial barriers to participation in CR or self health management.    Interventions Encouraged to attend Cardiac Rehabilitation for the exercise    Continue Psychosocial Services  No Follow up required           Vocational Rehabilitation: Provide vocational rehab assistance to qualifying candidates.   Vocational Rehab Evaluation & Intervention:  Vocational Rehab - 12/28/19 1501      Initial Vocational Rehab Evaluation & Intervention   Assessment shows need for Vocational Rehabilitation No           Education: Education Goals: Education classes will be provided on a weekly basis, covering required topics. Participant will state understanding/return demonstration of topics presented.  Learning Barriers/Preferences:  Learning Barriers/Preferences - 12/28/19 1201      Learning Barriers/Preferences   Learning Barriers Sight    Learning Preferences Written Material;Skilled Demonstration;Individual Instruction           Education Topics: Count Your Pulse:  -Group instruction provided by verbal instruction, demonstration, patient participation and written materials to support subject.  Instructors  address importance of being able to find your pulse and how to count your pulse when at home without a heart monitor.  Patients get hands on experience counting their pulse with staff help and individually.   Heart Attack, Angina, and Risk Factor Modification:  -Group instruction provided by verbal instruction, video, and written materials to support subject.  Instructors address signs and symptoms of angina and heart attacks.    Also discuss risk factors for heart disease and how to make changes to improve heart health risk factors.   Functional Fitness:  -Group instruction provided by verbal instruction, demonstration, patient participation, and written materials to support subject.  Instructors address safety measures for doing things around the house.  Discuss how to get up and down off the floor, how to pick things up properly, how to safely get out of a chair without assistance, and balance training.   Meditation and Mindfulness:  -Group instruction provided by verbal instruction, patient participation, and written materials to support subject.  Instructor addresses importance of mindfulness and meditation practice to help reduce stress and improve awareness.  Instructor also leads participants through a meditation exercise.    Stretching for Flexibility and Mobility:  -Group instruction provided by verbal instruction, patient participation, and written materials to support subject.  Instructors lead participants through series of stretches that are designed  to increase flexibility thus improving mobility.  These stretches are additional exercise for major muscle groups that are typically performed during regular warm up and cool down.   Hands Only CPR:  -Group verbal, video, and participation provides a basic overview of AHA guidelines for community CPR. Role-play of emergencies allow participants the opportunity to practice calling for help and chest compression technique with discussion  of AED use.   Hypertension: -Group verbal and written instruction that provides a basic overview of hypertension including the most recent diagnostic guidelines, risk factor reduction with self-care instructions and medication management.    Nutrition I class: Heart Healthy Eating:  -Group instruction provided by PowerPoint slides, verbal discussion, and written materials to support subject matter. The instructor gives an explanation and review of the Therapeutic Lifestyle Changes diet recommendations, which includes a discussion on lipid goals, dietary fat, sodium, fiber, plant stanol/sterol esters, sugar, and the components of a well-balanced, healthy diet.   Nutrition II class: Lifestyle Skills:  -Group instruction provided by PowerPoint slides, verbal discussion, and written materials to support subject matter. The instructor gives an explanation and review of label reading, grocery shopping for heart health, heart healthy recipe modifications, and ways to make healthier choices when eating out.   Diabetes Question & Answer:  -Group instruction provided by PowerPoint slides, verbal discussion, and written materials to support subject matter. The instructor gives an explanation and review of diabetes co-morbidities, pre- and post-prandial blood glucose goals, pre-exercise blood glucose goals, signs, symptoms, and treatment of hypoglycemia and hyperglycemia, and foot care basics.   Diabetes Blitz:  -Group instruction provided by PowerPoint slides, verbal discussion, and written materials to support subject matter. The instructor gives an explanation and review of the physiology behind type 1 and type 2 diabetes, diabetes medications and rational behind using different medications, pre- and post-prandial blood glucose recommendations and Hemoglobin A1c goals, diabetes diet, and exercise including blood glucose guidelines for exercising safely.    Portion Distortion:  -Group instruction  provided by PowerPoint slides, verbal discussion, written materials, and food models to support subject matter. The instructor gives an explanation of serving size versus portion size, changes in portions sizes over the last 20 years, and what consists of a serving from each food group.   Stress Management:  -Group instruction provided by verbal instruction, video, and written materials to support subject matter.  Instructors review role of stress in heart disease and how to cope with stress positively.     Exercising on Your Own:  -Group instruction provided by verbal instruction, power point, and written materials to support subject.  Instructors discuss benefits of exercise, components of exercise, frequency and intensity of exercise, and end points for exercise.  Also discuss use of nitroglycerin and activating EMS.  Review options of places to exercise outside of rehab.  Review guidelines for sex with heart disease.   Cardiac Drugs I:  -Group instruction provided by verbal instruction and written materials to support subject.  Instructor reviews cardiac drug classes: antiplatelets, anticoagulants, beta blockers, and statins.  Instructor discusses reasons, side effects, and lifestyle considerations for each drug class.   Cardiac Drugs II:  -Group instruction provided by verbal instruction and written materials to support subject.  Instructor reviews cardiac drug classes: angiotensin converting enzyme inhibitors (ACE-I), angiotensin II receptor blockers (ARBs), nitrates, and calcium channel blockers.  Instructor discusses reasons, side effects, and lifestyle considerations for each drug class.   Anatomy and Physiology of the Circulatory System:  Group verbal and written  instruction and models provide basic cardiac anatomy and physiology, with the coronary electrical and arterial systems. Review of: AMI, Angina, Valve disease, Heart Failure, Peripheral Artery Disease, Cardiac Arrhythmia,  Pacemakers, and the ICD.   Other Education:  -Group or individual verbal, written, or video instructions that support the educational goals of the cardiac rehab program.   Holiday Eating Survival Tips:  -Group instruction provided by PowerPoint slides, verbal discussion, and written materials to support subject matter. The instructor gives patients tips, tricks, and techniques to help them not only survive but enjoy the holidays despite the onslaught of food that accompanies the holidays.   Knowledge Questionnaire Score:  Knowledge Questionnaire Score - 12/28/19 1158      Knowledge Questionnaire Score   Pre Score 21/24           Core Components/Risk Factors/Patient Goals at Admission:  Personal Goals and Risk Factors at Admission - 12/28/19 1502      Core Components/Risk Factors/Patient Goals on Admission    Weight Management Yes;Obesity    Intervention Weight Management: Develop a combined nutrition and exercise program designed to reach desired caloric intake, while maintaining appropriate intake of nutrient and fiber, sodium and fats, and appropriate energy expenditure required for the weight goal.;Weight Management: Provide education and appropriate resources to help participant work on and attain dietary goals.;Weight Management/Obesity: Establish reasonable short term and long term weight goals.    Expected Outcomes Short Term: Continue to assess and modify interventions until short term weight is achieved;Long Term: Adherence to nutrition and physical activity/exercise program aimed toward attainment of established weight goal;Weight Loss: Understanding of general recommendations for a balanced deficit meal plan, which promotes 1-2 lb weight loss per week and includes a negative energy balance of [phone] kcal/d;Understanding recommendations for meals to include 15-35% energy as protein, 25-35% energy from fat, 35-60% energy from carbohydrates, less than 200mg  of dietary  cholesterol, 20-35 gm of total fiber daily;Understanding of distribution of calorie intake throughout the day with the consumption of 4-5 meals/snacks    Diabetes Yes    Intervention Provide education about signs/symptoms and action to take for hypo/hyperglycemia.;Provide education about proper nutrition, including hydration, and aerobic/resistive exercise prescription along with prescribed medications to achieve blood glucose in normal ranges: Fasting glucose 65-99 mg/dL    Expected Outcomes Short Term: Participant verbalizes understanding of the signs/symptoms and immediate care of hyper/hypoglycemia, proper foot care and importance of medication, aerobic/resistive exercise and nutrition plan for blood glucose control.;Long Term: Attainment of HbA1C < 7%.    Hypertension Yes    Intervention Provide education on lifestyle modifcations including regular physical activity/exercise, weight management, moderate sodium restriction and increased consumption of fresh fruit, vegetables, and low fat dairy, alcohol moderation, and smoking cessation.;Monitor prescription use compliance.    Expected Outcomes Short Term: Continued assessment and intervention until BP is < 140/68mm HG in hypertensive participants. < 130/5mm HG in hypertensive participants with diabetes, heart failure or chronic kidney disease.;Long Term: Maintenance of blood pressure at goal levels.    Lipids Yes    Intervention Provide education and support for participant on nutrition & aerobic/resistive exercise along with prescribed medications to achieve LDL 70mg , HDL >40mg .    Expected Outcomes Short Term: Participant states understanding of desired cholesterol values and is compliant with medications prescribed. Participant is following exercise prescription and nutrition guidelines.;Long Term: Cholesterol controlled with medications as prescribed, with individualized exercise RX and with personalized nutrition plan. Value goals: LDL < 70mg ,  HDL > 40 mg.  Core Components/Risk Factors/Patient Goals Review:   Goals and Risk Factor Review    Row Name 01/01/20 1610 01/16/20 1144 01/30/20 0936         Core Components/Risk Factors/Patient Goals Review   Personal Goals Review Weight Management/Obesity;Lipids;Diabetes;Hypertension Weight Management/Obesity;Lipids;Diabetes;Hypertension Weight Management/Obesity;Lipids;Diabetes;Hypertension     Review Mr. Guerrette has multiple CAD risk factors. He is eager to participate in CR for risk factor modification through exercise and education. His goals are to increase his overall health and heart strength Mr. Poitra has multiple CAD risk factors. He is eager to participate in CR for risk factor modification through exercise and education. His goals are to increase his overall health and heart strength. Too soon in the program to evaluate progression towards goals. Mr. Isham has multiple CAD risk factors. He remains eager to participate in CR for modification through exercise and education. He continues to take all medications as prescribed and attends all medical appointments including CR. He is following his diabetes plan of care and checks his blood sugars daily, and while in CR before and after exercise. CBGs pre exercise and just after snack 150s-205. Post exercise CBG 85-115. He continues to meet with RD for ongoing dietary assessment and education related to management of CAD and DM. His weight remains stable at 89.7 (admission weight). He feels he is making progress towards his goal of improving his overall health.     Expected Outcomes Patient will continue to participate in CR for risk factor modification Patient will continue to participate in CR for risk factor modification Patient will continue to participate in CR for risk factor modification. He will continue to manage his CAD risk factors by exercising daily, taking all medication as prescribed, attending all medical appointments,  following a diabetic and heart healthy diet, and monitoring his BP and CBG as instructed.            Core Components/Risk Factors/Patient Goals at Discharge (Final Review):   Goals and Risk Factor Review - 01/30/20 0936      Core Components/Risk Factors/Patient Goals Review   Personal Goals Review Weight Management/Obesity;Lipids;Diabetes;Hypertension    Review Mr. Heninger has multiple CAD risk factors. He remains eager to participate in CR for modification through exercise and education. He continues to take all medications as prescribed and attends all medical appointments including CR. He is following his diabetes plan of care and checks his blood sugars daily, and while in CR before and after exercise. CBGs pre exercise and just after snack 150s-205. Post exercise CBG 85-115. He continues to meet with RD for ongoing dietary assessment and education related to management of CAD and DM. His weight remains stable at 89.7 (admission weight). He feels he is making progress towards his goal of improving his overall health.    Expected Outcomes Patient will continue to participate in CR for risk factor modification. He will continue to manage his CAD risk factors by exercising daily, taking all medication as prescribed, attending all medical appointments, following a diabetic and heart healthy diet, and monitoring his BP and CBG as instructed.           ITP Comments:  ITP Comments    Row Name 12/28/19 1144 01/01/20 1601 01/16/20 1139 01/30/20 0934     ITP Comments Dr. Fransico Him, Medical Director Mr. Canterbury completed his first cardiac rehab exercise session today and tolerated very well. Denied complaints. VSS. CBG post exercise 85. Patient was provided snack of peanut butter and graham crackers. Worked to an RPE  of 11-13. Mr. Wanninger personal program goals are to increase his overall health and heart strength. 30 day ITP review: Mr. Mandato has attended 3 cardiac rehab exercise sessions since  admission. VS remain stable during exercise. Denies complaints. Works to an RPE of 11-12. Personal goals are to increase his overall health. Although Mr. Szeto is doing very well in CR, should see greater progression towards goals over the next 30 days. Will continue to monitor. Mr. Mau continue to do well in cardiac rehab. He is tolerating work load increases and consistently works to an RPE of 11-12. Average mets 2.8 to 3.3. His goal is to improve his overall health. He is on track to meet his goal at discharge.           Comments: Pt is making expected progress toward personal goals after completing  13 sessions. Recommend continued exercise and life style modification education including  stress management and relaxation techniques to decrease cardiac risk profile. Cherre Huger, BSN Cardiac and Training and development officer

## 2020-02-16 ENCOUNTER — Encounter (HOSPITAL_COMMUNITY)
Admission: RE | Admit: 2020-02-16 | Discharge: 2020-02-16 | Disposition: A | Discharge status: Home / Self Care | Payer: Medicare HMO | Source: Ambulatory Visit | Attending: Cardiology | Admitting: Cardiology

## 2020-02-16 ENCOUNTER — Encounter (HOSPITAL_COMMUNITY): Payer: Medicare HMO

## 2020-02-16 ENCOUNTER — Other Ambulatory Visit: Payer: Self-pay

## 2020-02-16 VITALS — Ht 67.5 in | Wt 196.9 lb

## 2020-02-16 DIAGNOSIS — Z955 Presence of coronary angioplasty implant and graft: Secondary | ICD-10-CM

## 2020-02-16 DIAGNOSIS — I2111 ST elevation (STEMI) myocardial infarction involving right coronary artery: Secondary | ICD-10-CM | POA: Diagnosis not present

## 2020-02-19 ENCOUNTER — Encounter (HOSPITAL_COMMUNITY)
Admission: RE | Admit: 2020-02-19 | Discharge: 2020-02-19 | Disposition: A | Discharge status: Home / Self Care | Payer: Medicare HMO | Source: Ambulatory Visit | Attending: Cardiology | Admitting: Cardiology

## 2020-02-19 ENCOUNTER — Other Ambulatory Visit: Payer: Self-pay

## 2020-02-19 DIAGNOSIS — Z955 Presence of coronary angioplasty implant and graft: Secondary | ICD-10-CM | POA: Diagnosis not present

## 2020-02-19 DIAGNOSIS — I2111 ST elevation (STEMI) myocardial infarction involving right coronary artery: Secondary | ICD-10-CM | POA: Diagnosis not present

## 2020-02-21 ENCOUNTER — Other Ambulatory Visit: Payer: Self-pay

## 2020-02-21 ENCOUNTER — Encounter (HOSPITAL_COMMUNITY)
Admission: RE | Admit: 2020-02-21 | Discharge: 2020-02-21 | Disposition: A | Discharge status: Home / Self Care | Payer: Medicare HMO | Source: Ambulatory Visit | Attending: Cardiology | Admitting: Cardiology

## 2020-02-21 DIAGNOSIS — I2111 ST elevation (STEMI) myocardial infarction involving right coronary artery: Secondary | ICD-10-CM

## 2020-02-21 DIAGNOSIS — Z955 Presence of coronary angioplasty implant and graft: Secondary | ICD-10-CM

## 2020-02-21 NOTE — Progress Notes (Signed)
Discharge Progress Report  Patient Details  Name: Alan Morrison MRN: [id number] Date of Birth: Jan 22, 1951 Referring Provider:     [place] from 12/28/2019 in [place]  Referring Provider Martinique, Peter MD       Number of Visits: 16  Reason for Discharge:  Patient reached a stable level of exercise. Patient independent in their exercise. Patient has met program and personal goals.  Smoking History:  Social History   Tobacco Use  Smoking Status Former Smoker  . Quit date: 04/20/1974  . Years since quitting: 45.8  Smokeless Tobacco Never Used  Tobacco Comment   quit over 20 years    Diagnosis:  ST elevation myocardial infarction involving right coronary artery ([place]) 11/12/19  S/P DES RCA 11/12/19  ADL [facility]:   Initial Exercise Prescription:  Initial Exercise Prescription - 12/28/19 1200      Date of Initial Exercise RX and Referring Provider   Date 12/28/19    Referring Provider Martinique, Peter MD    Expected Discharge Date 02/23/20      Recumbant Bike   Level 1.5    Watts 15    Minutes 15    METs 2.5      NuStep   Level 2    SPM 80    Minutes 15    METs 2.5      Prescription Details   Frequency (times per week) 3x    Duration Progress to 10 minutes continuous walking  at current work load and total walking time to 30-45 min      Intensity   THRR 40-80% of Max Heartrate 61-122    Ratings of Perceived Exertion 11-13    Perceived Dyspnea 0-4      Progression   Progression Continue progressive overload as per policy without signs/symptoms or physical distress.      Resistance Training   Training Prescription Yes    Weight 3lbs    Reps 10-15           Discharge Exercise Prescription (Final Exercise Prescription Changes):  Exercise Prescription Changes - 02/21/20 1400      Response to Exercise   Blood Pressure (Admit) 110/64    Blood Pressure (Exercise) 122/64    Blood Pressure (Exit) 96/60     Heart Rate (Admit) 81 bpm    Heart Rate (Exercise) 120 bpm    Heart Rate (Exit) 88 bpm    Rating of Perceived Exertion (Exercise) 12    Symptoms None    Comments Pt graduated from the [facility] program today.    Duration Continue with 30 min of aerobic exercise without signs/symptoms of physical distress.    Intensity THRR unchanged      Progression   Progression Continue to progress workloads to maintain intensity without signs/symptoms of physical distress.    Average METs 3.3      Resistance Training   Training Prescription No    Weight --   No weights on wednesday but used 5 lbs M & F     Interval Training   Interval Training No      Recumbant Bike   Level 3    Minutes 15    METs 3      NuStep   Level 4    SPM 95    Minutes 15    METs 3.6      Home Exercise Plan   Plans to continue exercise at Home (comment)    Frequency Add  2 additional days to program exercise sessions.    Initial Home Exercises Provided 01/17/20           Functional Capacity:  6 Minute Walk    Row Name 12/28/19 1144 02/16/20 1335       6 Minute Walk   Phase Initial Discharge    Distance 1491 feet 1570 feet    Distance % Change -- 5.3 %    Distance Feet Change -- 79 ft    Walk Time 6 minutes 6 minutes    # of Rest Breaks 0 0    MPH 2.8 3    METS 3.02 3.3    RPE 12 11    Perceived Dyspnea  0 0    VO2 Peak 10.6 11.4    Symptoms No No    Resting HR 67 bpm 62 bpm    Resting BP 104/72 102/72    Resting Oxygen Saturation  97 % 98 %    Exercise Oxygen Saturation  during 6 min walk 97 % 98 %    Max Ex. HR 92 bpm 102 bpm    Max Ex. BP 120/80 120/76    2 Minute Post BP 106/70 --           Psychological, QOL, Others - Outcomes: PHQ 2/9: Depression screen PHQ 2/9 12/28/2019 05/31/2019 02/02/2019 10/18/2017 02/11/2017  Decreased Interest 0 0 0 0 0  Down, Depressed, Hopeless 0 1 0 0 0  PHQ - 2 Score 0 1 0 0 0  Altered sleeping - 2 - - -  Tired, decreased energy - 3 - - -  Change in  appetite - 0 - - -  Feeling bad or failure about yourself  - 0 - - -  Trouble concentrating - 0 - - -  Moving slowly or fidgety/restless - 0 - - -  Suicidal thoughts - 0 - - -  PHQ-9 Score - 6 - - -  Some recent data might be hidden    Quality of Life:  Quality of Life - 02/16/20 1500      Quality of Life Scores   Health/Function Post 24.7 %    Socioeconomic Post 27.36 %    Psych/Spiritual Post 28.29 %    Family Post 26.4 %    GLOBAL Post 26.24 %           Personal Goals: Goals established at orientation with interventions provided to work toward goal.  Personal Goals and Risk Factors at Admission - 12/28/19 1502      Core Components/Risk Factors/Patient Goals on Admission    Weight Management Yes;Obesity    Intervention Weight Management: Develop a combined nutrition and exercise program designed to reach desired caloric intake, while maintaining appropriate intake of nutrient and fiber, sodium and fats, and appropriate energy expenditure required for the weight goal.;Weight Management: Provide education and appropriate resources to help participant work on and attain dietary goals.;Weight Management/Obesity: Establish reasonable short term and long term weight goals.    Expected Outcomes Short Term: Continue to assess and modify interventions until short term weight is achieved;Long Term: Adherence to nutrition and physical activity/exercise program aimed toward attainment of established weight goal;Weight Loss: Understanding of general recommendations for a balanced deficit meal plan, which promotes 1-2 lb weight loss per week and includes a negative energy balance of 500-1000 kcal/d;Understanding recommendations for meals to include 15-35% energy as protein, 25-35% energy from fat, 35-60% energy from carbohydrates, less than 200mg of dietary cholesterol, 20-35 gm of total   fiber daily;Understanding of distribution of calorie intake throughout the day with the consumption of 4-5  meals/snacks    Diabetes Yes    Intervention Provide education about signs/symptoms and action to take for hypo/hyperglycemia.;Provide education about proper nutrition, including hydration, and aerobic/resistive exercise prescription along with prescribed medications to achieve blood glucose in normal ranges: Fasting glucose 65-99 mg/dL    Expected Outcomes Short Term: Participant verbalizes understanding of the signs/symptoms and immediate care of hyper/hypoglycemia, proper foot care and importance of medication, aerobic/resistive exercise and nutrition plan for blood glucose control.;Long Term: Attainment of HbA1C < 7%.    Hypertension Yes    Intervention Provide education on lifestyle modifcations including regular physical activity/exercise, weight management, moderate sodium restriction and increased consumption of fresh fruit, vegetables, and low fat dairy, alcohol moderation, and smoking cessation.;Monitor prescription use compliance.    Expected Outcomes Short Term: Continued assessment and intervention until BP is < 140/90mm HG in hypertensive participants. < 130/80mm HG in hypertensive participants with diabetes, heart failure or chronic kidney disease.;Long Term: Maintenance of blood pressure at goal levels.    Lipids Yes    Intervention Provide education and support for participant on nutrition & aerobic/resistive exercise along with prescribed medications to achieve LDL <70mg, HDL >40mg.    Expected Outcomes Short Term: Participant states understanding of desired cholesterol values and is compliant with medications prescribed. Participant is following exercise prescription and nutrition guidelines.;Long Term: Cholesterol controlled with medications as prescribed, with individualized exercise RX and with personalized nutrition plan. Value goals: LDL < 70mg, HDL > 40 mg.            Personal Goals Discharge:  Goals and Risk Factor Review    Row Name 01/01/20 1610 01/16/20 1144 01/30/20  0936 02/23/20 1409       Core Components/Risk Factors/Patient Goals Review   Personal Goals Review Weight Management/Obesity;Lipids;Diabetes;Hypertension Weight Management/Obesity;Lipids;Diabetes;Hypertension Weight Management/Obesity;Lipids;Diabetes;Hypertension Weight Management/Obesity;Lipids;Diabetes;Hypertension    Review Mr. Murguia has multiple CAD risk factors. He is eager to participate in CR for risk factor modification through exercise and education. His goals are to increase his overall health and heart strength Mr. Hosack has multiple CAD risk factors. He is eager to participate in CR for risk factor modification through exercise and education. His goals are to increase his overall health and heart strength. Too soon in the program to evaluate progression towards goals. Mr. Azbill has multiple CAD risk factors. He remains eager to participate in CR for modification through exercise and education. He continues to take all medications as prescribed and attends all medical appointments including CR. He is following his diabetes plan of care and checks his blood sugars daily, and while in CR before and after exercise. CBGs pre exercise and just after snack 150s-205. Post exercise CBG 85-115. He continues to meet with RD for ongoing dietary assessment and education related to management of CAD and DM. His weight remains stable at 89.7 (admission weight). He feels he is making progress towards his goal of improving his overall health. Multiple CAD risk factors. A1C controlled at 6.3. BP WNL on medications and heart healthy diet. Weight remained stable however he has recieved education from RD while in the program for    Expected Outcomes Patient will continue to participate in CR for risk factor modification Patient will continue to participate in CR for risk factor modification Patient will continue to participate in CR for risk factor modification. He will continue to manage his CAD risk factors by  exercising daily, taking all   medication as prescribed, attending all medical appointments, following a diabetic and heart healthy diet, and monitoring his BP and CBG as instructed. Patient will continue to utilize exercise and education learned in CR for risk factor modification post discharge. He will continue to manage his CAD risk factors by exercising daily, taking all medication as prescribed, attending all medical appointments, following a diabetic and heart healthy diet, and monitoring his BP and CBG as instructed.           Exercise Goals and Review:  Exercise Goals    Row Name 12/28/19 1153             Exercise Goals   Increase Physical Activity Yes       Intervention Provide advice, education, support and counseling about physical activity/exercise needs.;Develop an individualized exercise prescription for aerobic and resistive training based on initial evaluation findings, risk stratification, comorbidities and participant's personal goals.       Expected Outcomes Short Term: Attend rehab on a regular basis to increase amount of physical activity.;Long Term: Add in home exercise to make exercise part of routine and to increase amount of physical activity.;Long Term: Exercising regularly at least 3-5 days a week.       Increase Strength and Stamina Yes       Intervention Provide advice, education, support and counseling about physical activity/exercise needs.;Develop an individualized exercise prescription for aerobic and resistive training based on initial evaluation findings, risk stratification, comorbidities and participant's personal goals.       Expected Outcomes Short Term: Increase workloads from initial exercise prescription for resistance, speed, and METs.;Short Term: Perform resistance training exercises routinely during rehab and add in resistance training at home;Long Term: Improve cardiorespiratory fitness, muscular endurance and strength as measured by increased METs and  functional capacity (6MWT)       Able to understand and use rate of perceived exertion (RPE) scale Yes       Intervention Provide education and explanation on how to use RPE scale       Expected Outcomes Short Term: Able to use RPE daily in rehab to express subjective intensity level;Long Term:  Able to use RPE to guide intensity level when exercising independently       Knowledge and understanding of Target Heart Rate Range (THRR) Yes       Intervention Provide education and explanation of THRR including how the numbers were predicted and where they are located for reference       Expected Outcomes Short Term: Able to state/look up THRR;Long Term: Able to use THRR to govern intensity when exercising independently;Short Term: Able to use daily as guideline for intensity in rehab       Able to check pulse independently Yes       Intervention Provide education and demonstration on how to check pulse in carotid and radial arteries.;Review the importance of being able to check your own pulse for safety during independent exercise       Expected Outcomes Long Term: Able to check pulse independently and accurately;Short Term: Able to explain why pulse checking is important during independent exercise       Understanding of Exercise Prescription Yes       Intervention Provide education, explanation, and written materials on patient's individual exercise prescription       Expected Outcomes Short Term: Able to explain program exercise prescription;Long Term: Able to explain home exercise prescription to exercise independently                Exercise Goals Re-Evaluation:  Exercise Goals Re-Evaluation    Row Name 01/01/20 1445 01/17/20 1624 01/29/20 1445 01/31/20 0846 02/14/20 1500     Exercise Goal Re-Evaluation   Exercise Goals Review Increase Physical Activity;Increase Strength and Stamina;Able to understand and use rate of perceived exertion (RPE) scale;Knowledge and understanding of Target Heart Rate  Range (THRR);Able to check pulse independently;Understanding of Exercise Prescription Increase Physical Activity;Increase Strength and Stamina;Able to understand and use rate of perceived exertion (RPE) scale;Knowledge and understanding of Target Heart Rate Range (THRR);Able to check pulse independently;Understanding of Exercise Prescription Increase Physical Activity;Increase Strength and Stamina;Able to understand and use rate of perceived exertion (RPE) scale;Knowledge and understanding of Target Heart Rate Range (THRR);Able to check pulse independently;Understanding of Exercise Prescription -- Increase Physical Activity;Increase Strength and Stamina;Able to understand and use rate of perceived exertion (RPE) scale;Knowledge and understanding of Target Heart Rate Range (THRR);Able to check pulse independently;Understanding of Exercise Prescription   Comments Pt's first day of exercise in the [facility] program. Pt tolerated exercise well and understands exercise Rx, RPE scale and THRR. Reviewd Home execise Rx today with patient. Pt encouraged wo walk 2-3 x/week at home in addtion to his [facility] exercise program. Pt agrees. Pt verbalized understanding of the home exercise Rx and was provided copy. Pt is making good progress in the [facility] program. Pt is walking at home 2-3x/wek for 30-45 minutes. -- Reviewed METs and Goals. Pt continues to progress without complaints.   Expected Outcomes Will continue to monitor patient reposnse to exercise and progress as tolerated. Pt will walk at home 2-3x/week for 30-45 minutes. Will continue to monitor and progress as tolerated. -- Will continue to monitor and progress as tolerated.   Row Name 02/21/20 1449             Exercise Goal Re-Evaluation   Exercise Goals Review Increase Physical Activity;Increase Strength and Stamina;Able to understand and use rate of perceived exertion (RPE) scale;Knowledge and understanding of Target Heart Rate Range (THRR);Able to check pulse  independently;Understanding of Exercise Prescription       Comments Pt graduated from the [facility] program. Pt progress well through the program and had an average MET level of 3.3. PT will continue his exercise st home by walking and going to the [employer].       Expected Outcomes Pt graduated the [facility] program today and progressed well throught the [facility] program.              Nutrition & Weight - Outcomes:  Pre Biometrics - 12/28/19 1154      Pre Biometrics   Height 5' 7.5" (1.715 m)    Weight 89.7 kg    Waist Circumference 44.5 inches    Hip Circumference 45 inches    Waist to Hip Ratio 0.99 %    BMI (Calculated) 30.5    Triceps Skinfold 20 mm    % Body Fat 32 %    Grip Strength 31 kg    Flexibility 11 in    Single Leg Stand 30 seconds           Post Biometrics - 02/16/20 1430       Post  Biometrics   Height 5' 7.5" (1.715 m)    Weight 89.3 kg    Waist Circumference 43 inches    Hip Circumference 43.5 inches    Waist to Hip Ratio 0.99 %    BMI (Calculated) 30.36    Triceps Skinfold 19 mm    % Body Fat 31.2 %      Grip Strength 35 kg    Flexibility 0 in    Single Leg Stand 20 seconds           Nutrition:  Nutrition Therapy & Goals - 01/03/20 1427      Nutrition Therapy   Diet Heart Healthy    Drug/Food Interactions Statins/Certain Fruits      Personal Nutrition Goals   Nutrition Goal Pt to identify food quantities necessary to achieve weight loss of 6-24 lb at graduation from cardiac rehab.    Personal Goal #2 Pt to build a healthy plate including vegetables, fruits, whole grains, and low-fat dairy products in a heart healthy meal plan    Personal Goal #3 Pt to incorporate protein shake      Intervention Plan   Intervention Prescribe, educate and counsel regarding individualized specific dietary modifications aiming towards targeted core components such as weight, hypertension, lipid management, diabetes, heart failure and other comorbidities.;Nutrition handout(s)  given to patient.    Expected Outcomes Short Term Goal: Understand basic principles of dietary content, such as calories, fat, sodium, cholesterol and nutrients.           Nutrition Discharge:  Nutrition Assessments - 01/09/20 1022      MEDFICTS Scores   Pre Score 0           Education Questionnaire Score:  Knowledge Questionnaire Score - 02/16/20 1500      Knowledge Questionnaire Score   Post Score 22/24           Goals reviewed with patient; copy given to patient. 

## 2020-02-23 ENCOUNTER — Encounter (HOSPITAL_COMMUNITY): Payer: Medicare HMO

## 2020-02-29 DIAGNOSIS — R69 Illness, unspecified: Secondary | ICD-10-CM | POA: Diagnosis not present

## 2020-03-05 NOTE — Progress Notes (Signed)
Cardiology Office Note:    Date:  03/07/2020   ID:  Alan Morrison, DOB [birth year], MRN [id number]  PCP:  [name], MD  [facility] Cardiologist:  Kieana Livesay Martinique, MD  [place] Electrophysiologist:  None   Referring MD: [name], MD   Chief Complaint  Patient presents with  . Coronary Artery Disease    History of Present Illness:    Alan Morrison is a 69 y.o. male with a hx of HLD, DM II, stress cardiomyopathy, OSA on CPAP, history of DVT on Xarelto, former smoker, and CAD.  Patient had nonobstructive CAD by angiography in 2018.  More recently, he presented with inferior wall acute STEMI on 11/12/2019.  Initial EKG did identify inferior Q waves with persistent ST elevation in the inferior lead.  Cardiac catheterization performed on 11/12/2019 showed total occlusion of proximal to mid RCA treated with 3.0 x 22 mm Onyx DES postdilated to 3.5 mm, 30% mid PDA vessel lesion, widely patent left main, 50% proximal to mid LAD lesion, EF 55%, LVEDP 16 mmHg.  Postprocedure, patient was placed on triple therapy including aspirin, Plavix and Xarelto.  The plan is to discontinue aspirin after 1 month.  Echocardiogram showed EF 45 to 50%.  During the hospitalization patient also had frequent hypoglycemic spells at home, he was not very compliant with insulin at home which explains the hypoglycemic episodes when he was placed on the full-strength insulin.  Lovenox was stopped and he was discharged on long-acting insulin only with plan to be evaluated by his endocrinologist for further medication adjustment.  He was seen by Dr. Jana Hakim on 11/20/2019 who discontinued the Xarelto.  On follow up today he is doing very well. Denies chest pain or SOB. Occasional mild tightness. He has completed cardiac Rehab. Followed by Endocrinology in [place]. Has made dietary changes and lost 10 lbs. Tolerating medication well.   Past Medical History:  Diagnosis Date  . Anemia, iron deficiency,  inadequate dietary intake   . 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   by [place]  . CAD (coronary artery disease) 2003   cath - Min Dz, EF 65%, Adm.R/O'd  . Cervical spondylosis without myelopathy   . Chest pain, atypical   . Choroidal nevus 01/22/2011   left, yearly eye exam, no diabetic retinopathy  . Diabetes mellitus type II   . Dyspepsia   . Ex-smoker   . Fatty liver 02/2016   by [place]  . GERD (gastroesophageal reflux disease)   . Headache(784.0)   . Heart murmur    Hx of  . History of MRI of brain and brain stem 09/06  . History of MRSA infection 12/2013   R knee boil  . HLD (hyperlipidemia)   . Hyperuricemia   . Obesity   . Sleep apnea   . Stress-induced cardiomyopathy 09/27/98   WNL, EF 64%  . Urosepsis 01/01-01/05/10   Hospitalization    Past Surgical History:  Procedure Laterality Date  . CARDIAC CATHETERIZATION    . COLONOSCOPY  07/2013   1 benign polyp rpt 10 yrs [name])  . CORONARY THROMBECTOMY N/A 11/12/2019   Procedure: Coronary Thrombectomy;  Surgeon: Belva Crome, MD;  Location: [facility];  Service: Cardiovascular;  Laterality: N/A;  . CORONARY/GRAFT ACUTE MI REVASCULARIZATION N/A 11/12/2019   Procedure: Coronary/Graft Acute MI Revascularization;  Surgeon: Belva Crome, MD;  Location: [facility];  Service: Cardiovascular;  Laterality: N/A;  . KNEE ARTHROSCOPY Left 1988  . KNEE SURGERY Right 05/21/03  Right, Dr. Marlou Sa, med meniscus tear via MRI  . LEFT HEART CATH AND CORONARY ANGIOGRAPHY N/A 04/01/2017   Procedure: LEFT HEART CATH AND CORONARY ANGIOGRAPHY;  Surgeon: Martinique, Aprile Dickenson M, MD;  Location: [facility];  Service: Cardiovascular;  Laterality: N/A;  . LEFT HEART CATH AND CORONARY ANGIOGRAPHY N/A 11/12/2019   Procedure: LEFT HEART CATH AND CORONARY ANGIOGRAPHY;  Surgeon: Belva Crome, MD;  Location: [facility];  Service: Cardiovascular;  Laterality: N/A;  . MYELOGRAM  08/05   bulging disc    Current Medications: Current  Meds  Medication Sig  . acetaminophen (TYLENOL) 500 MG tablet Take 1,000 mg by mouth 2 (two) times daily as needed for headache (pain).  [name] aspirin EC 81 MG tablet Take 81 mg by mouth daily with supper. Swallow whole.  . Calcium Carbonate Antacid (TUMS PO) Take 1 tablet by mouth daily as needed (acid reflux/indigestion).  . Cholecalciferol (VITAMIN D) 2000 units tablet Take 2,000 Units by mouth daily with breakfast.   . clopidogrel (PLAVIX) 75 MG tablet Take 1 tablet (75 mg total) by mouth daily with breakfast.  . finasteride (PROSCAR) 5 MG tablet Take 1 tablet (5 mg total) by mouth daily.  . Insulin Glargine (BASAGLAR KWIKPEN) 100 UNIT/ML Inject 0.4 mLs (40 Units total) into the skin at bedtime.  . Insulin Pen Needle (B-D ULTRAFINE III SHORT PEN) 31G X 8 MM MISC 1 Device by Other route 4 (four) times daily. USE AS DIRECTED DAILY  . losartan (COZAAR) 25 MG tablet Take 1 tablet (25 mg total) by mouth daily.  . metFORMIN (GLUCOPHAGE) 1000 MG tablet Take 1,000 mg by mouth daily with breakfast.  . Multiple Vitamin (MULTIVITAMIN WITH MINERALS) TABS tablet Take 1 tablet by mouth daily with breakfast.   . nitroGLYCERIN (NITROSTAT) 0.4 MG SL tablet Place 1 tablet (0.4 mg total) under the tongue every 5 (five) minutes as needed for chest pain.  Glory Rosebush ULTRA test strip USE TO CHECK SUGARS DAILY  AS DIRECTED  . probenecid (BENEMID) 500 MG tablet Take 0.5 tablets (250 mg total) by mouth every other day.  . rosuvastatin (CRESTOR) 40 MG tablet Take 1 tablet (40 mg total) by mouth daily with supper.  . tamsulosin (FLOMAX) 0.4 MG CAPS capsule Take 1 capsule (0.4 mg total) by mouth daily.  . vitamin C (ASCORBIC ACID) 500 MG tablet Take 500 mg by mouth daily with supper.      Allergies:   Victoza [liraglutide]   Social History   Socioeconomic History  . Marital status: Married    Spouse name: Not on file  . Number of children: 2  . Years of education: 30  . Highest education level: High school  graduate  Occupational History  . Occupation: Maintenance    Employer: [employer]  Tobacco Use  . Smoking status: Former Smoker    Quit date: 04/20/1974    Years since quitting: 45.9  . Smokeless tobacco: Never Used  . Tobacco comment: quit over 20 years  Vaping Use  . Vaping Use: Never used  Substance and Sexual Activity  . Alcohol use: Yes    Alcohol/week: 3.0 standard drinks    Types: 3 Cans of beer per week    Comment: occasional  . Drug use: No  . Sexual activity: Not on file  Other Topics Concern  . Not on file  Social History Narrative   Lives with wife   Occupation: equipment maintenance   Activity: no regular exercise -  hunts   Diet: good water, fruits/vegetables   Social Determinants of Health   Financial Resource Strain:   . Difficulty of Paying Living Expenses: Not on file  Food Insecurity:   . Worried About Charity fundraiser in the Last Year: Not on file  . Ran Out of Food in the Last Year: Not on file  Transportation Needs:   . Lack of Transportation (Medical): Not on file  . Lack of Transportation (Non-Medical): Not on file  Physical Activity:   . Days of Exercise per Week: Not on file  . Minutes of Exercise per Session: Not on file  Stress:   . Feeling of Stress : Not on file  Social Connections:   . Frequency of Communication with Friends and Family: Not on file  . Frequency of Social Gatherings with Friends and Family: Not on file  . Attends Religious Services: Not on file  . Active Member of Clubs or Organizations: Not on file  . Attends Archivist Meetings: Not on file  . Marital Status: Not on file     Family History: The patient's family history includes CAD (age of onset: 19) in his brother; Cancer in his maternal uncle; Diabetes in his mother; Heart disease in his father and mother; Hypertension in his father; Migraines in his brother. There is no history of Stroke or Colon cancer.  ROS:   Please see the history of  present illness.     All other systems reviewed and are negative.  EKGs/Labs/Other Studies Reviewed:    The following studies were reviewed today:  Cath 11/12/2019  A stent was successfully placed.    Acute inferior ST elevation myocardial infarction with ongoing pain for greater than 9 hours.  Totally occluded proximal to mid RCA treated with 22 x 3.0 Onyx DES postdilated to 3.5 mm with TIMI grade III flow and resolution of symptoms.  The PDA contains segmental mid vessel 50 percent stenosis.  Widely patent, short left main  Widely patent LAD with proximal to mid eccentric 50 percent stenosis and luminal irregularities beyond.  Widely patent circumflex with luminal irregularities.  LV reveals inferior wall hypokinesis.  EF 55 percent.  LVEDP 16 mmHg.  RECOMMENDATIONS:   Aggressive risk factor modification.  If A1c significantly elevated, consider adding SGLT2 therapy.  Lipid panel and if LDL greater than 70, intensify management by adding ezetimibe or PCSK9.  Phase 2 cardiac rehab.   EKG:  EKG is ordered today.  The ekg ordered today demonstrates normal sinus rhythm, Q waves in the inferior leads, T wave inversion in inferior lateral leads.  Recent Labs: 11/12/2019: B Natriuretic Peptide 311.4 11/20/2019: ALT 33; BUN 32; Creatinine, Ser 1.37; Hemoglobin 15.0; Platelets 222; Potassium 4.6; Sodium 138  Recent Lipid Panel    Component Value Date/Time   CHOL 94 11/12/2019 1143   CHOL 112 11/01/2018 0000   TRIG 60 11/12/2019 1143   HDL 35 (L) 11/12/2019 1143   HDL 37 (L) 11/01/2018 0000   CHOLHDL 2.7 11/12/2019 1143   VLDL 12 11/12/2019 1143   LDLCALC 47 11/12/2019 1143   LDLCALC 59 11/01/2018 0000    Physical Exam:    VS:  BP 98/66   Pulse 90   Ht 5' 7.5" (1.715 m)   Wt 193 lb 3.2 oz (87.6 kg)   SpO2 97%   BMI 29.81 kg/m     Wt Readings from Last 3 Encounters:  03/07/20 193 lb 3.2 oz (87.6 kg)  02/16/20 196 lb 13.9  oz (89.3 kg)  12/28/19 197 lb 12 oz  (89.7 kg)     GEN:  Well nourished, well developed in no acute distress HEENT: Normal NECK: No JVD; No carotid bruits LYMPHATICS: No lymphadenopathy CARDIAC: RRR, no murmurs, rubs, gallops RESPIRATORY:  Clear to auscultation without rales, wheezing or rhonchi  ABDOMEN: Soft, non-tender, non-distended MUSCULOSKELETAL:  No edema; No deformity  SKIN: Warm and dry NEUROLOGIC:  Alert and oriented x 3 PSYCHIATRIC:  Normal affect   ASSESSMENT:    1. Coronary artery disease involving native coronary artery of native heart without angina pectoris   2. Hyperlipidemia LDL goal <70   3. Essential hypertension, benign   4. OSA on CPAP    PLAN:    In order of problems listed above:  1. CAD: s/p  cardiac catheterization for inferior STEMI and underwent DES to RCA in July.  Continue aspirin, Plavix for one year. On beta blocker and statin.  2. Hypertension: Blood pressure well controlled.   3. Hyperlipidemia: Continue statin therapy. Last LDL at goal  4. DM2: Managed by his endocrinologist.    5. History of DVT: He only had one episode of DVT and has completed over 1 year of Xarelto.  Xarelto has been discontinued by Dr. Jana Hakim.  Follow up in 6 months    Medication Adjustments/Labs and Tests Ordered: Current medicines are reviewed at length with the patient today.  Concerns regarding medicines are outlined above.  No orders of the defined types were placed in this encounter.  No orders of the defined types were placed in this encounter.   There are no Patient Instructions on file for this visit.   Signed, Mahki Spikes Martinique, MD  03/07/2020 2:45 PM    [facility]

## 2020-03-07 ENCOUNTER — Ambulatory Visit: Payer: Medicare HMO | Admitting: Cardiology

## 2020-03-07 ENCOUNTER — Other Ambulatory Visit: Payer: Self-pay

## 2020-03-07 VITALS — BP 98/66 | HR 90 | Ht 67.5 in | Wt 193.2 lb

## 2020-03-07 DIAGNOSIS — E785 Hyperlipidemia, unspecified: Secondary | ICD-10-CM

## 2020-03-07 DIAGNOSIS — I2510 Atherosclerotic heart disease of native coronary artery without angina pectoris: Secondary | ICD-10-CM | POA: Diagnosis not present

## 2020-03-07 DIAGNOSIS — Z9989 Dependence on other enabling machines and devices: Secondary | ICD-10-CM | POA: Diagnosis not present

## 2020-03-07 DIAGNOSIS — G4733 Obstructive sleep apnea (adult) (pediatric): Secondary | ICD-10-CM

## 2020-03-07 DIAGNOSIS — I10 Essential (primary) hypertension: Secondary | ICD-10-CM | POA: Diagnosis not present

## 2020-03-11 DIAGNOSIS — Z794 Long term (current) use of insulin: Secondary | ICD-10-CM | POA: Diagnosis not present

## 2020-03-11 DIAGNOSIS — I10 Essential (primary) hypertension: Secondary | ICD-10-CM | POA: Diagnosis not present

## 2020-03-11 DIAGNOSIS — E1165 Type 2 diabetes mellitus with hyperglycemia: Secondary | ICD-10-CM | POA: Diagnosis not present

## 2020-03-11 DIAGNOSIS — E782 Mixed hyperlipidemia: Secondary | ICD-10-CM | POA: Diagnosis not present

## 2020-04-11 DIAGNOSIS — Z20822 Contact with and (suspected) exposure to covid-19: Secondary | ICD-10-CM | POA: Diagnosis not present

## 2020-06-02 ENCOUNTER — Other Ambulatory Visit: Payer: Self-pay | Admitting: Family Medicine

## 2020-06-02 DIAGNOSIS — E1169 Type 2 diabetes mellitus with other specified complication: Secondary | ICD-10-CM

## 2020-06-02 DIAGNOSIS — D508 Other iron deficiency anemias: Secondary | ICD-10-CM

## 2020-06-02 DIAGNOSIS — E1142 Type 2 diabetes mellitus with diabetic polyneuropathy: Secondary | ICD-10-CM

## 2020-06-02 DIAGNOSIS — N138 Other obstructive and reflux uropathy: Secondary | ICD-10-CM

## 2020-06-02 DIAGNOSIS — E785 Hyperlipidemia, unspecified: Secondary | ICD-10-CM

## 2020-06-02 DIAGNOSIS — E559 Vitamin D deficiency, unspecified: Secondary | ICD-10-CM

## 2020-06-04 ENCOUNTER — Ambulatory Visit: Payer: Medicare HMO

## 2020-06-04 ENCOUNTER — Other Ambulatory Visit: Payer: Self-pay

## 2020-06-04 ENCOUNTER — Other Ambulatory Visit (INDEPENDENT_AMBULATORY_CARE_PROVIDER_SITE_OTHER): Payer: Medicare HMO

## 2020-06-04 DIAGNOSIS — E1169 Type 2 diabetes mellitus with other specified complication: Secondary | ICD-10-CM

## 2020-06-04 DIAGNOSIS — E785 Hyperlipidemia, unspecified: Secondary | ICD-10-CM

## 2020-06-04 DIAGNOSIS — Z0000 Encounter for general adult medical examination without abnormal findings: Secondary | ICD-10-CM

## 2020-06-04 DIAGNOSIS — N138 Other obstructive and reflux uropathy: Secondary | ICD-10-CM | POA: Diagnosis not present

## 2020-06-04 DIAGNOSIS — E559 Vitamin D deficiency, unspecified: Secondary | ICD-10-CM | POA: Diagnosis not present

## 2020-06-04 DIAGNOSIS — E1142 Type 2 diabetes mellitus with diabetic polyneuropathy: Secondary | ICD-10-CM | POA: Diagnosis not present

## 2020-06-04 DIAGNOSIS — N401 Enlarged prostate with lower urinary tract symptoms: Secondary | ICD-10-CM

## 2020-06-04 LAB — COMPREHENSIVE METABOLIC PANEL
ALT: 29 U/L (ref 0–53)
AST: 31 U/L (ref 0–37)
Albumin: 3.9 g/dL (ref 3.5–5.2)
Alkaline Phosphatase: 41 U/L (ref 39–117)
BUN: 27 mg/dL — ABNORMAL HIGH (ref 6–23)
CO2: 25 mEq/L (ref 19–32)
Calcium: 9.4 mg/dL (ref 8.4–10.5)
Chloride: 109 mEq/L (ref 96–112)
Creatinine, Ser: 1.18 mg/dL (ref 0.40–1.50)
GFR: 63.2 mL/min (ref >60.00)
Glucose, Bld: 97 mg/dL (ref 70–99)
Potassium: 4.4 mEq/L (ref 3.5–5.1)
Sodium: 142 mEq/L (ref 135–145)
Total Bilirubin: 0.5 mg/dL (ref 0.2–1.2)
Total Protein: 6.4 g/dL (ref 6.0–8.3)

## 2020-06-04 LAB — LIPID PANEL
Cholesterol: 113 mg/dL (ref 0–200)
HDL: 33.1 mg/dL — ABNORMAL LOW (ref >39.00)
LDL Cholesterol: 62 mg/dL (ref 0–99)
NonHDL: 79.5
Total CHOL/HDL Ratio: 3
Triglycerides: 86 mg/dL (ref 0.0–149.0)
VLDL: 17.2 mg/dL (ref 0.0–40.0)

## 2020-06-04 LAB — VITAMIN D 25 HYDROXY (VIT D DEFICIENCY, FRACTURES): VITD: 56.31 ng/mL (ref 30.00–100.00)

## 2020-06-04 LAB — HEMOGLOBIN A1C: Hgb A1c MFr Bld: 10 % — ABNORMAL HIGH (ref 4.6–6.5)

## 2020-06-04 LAB — PSA: PSA: 0.3 ng/mL (ref 0.10–4.00)

## 2020-06-04 NOTE — Patient Instructions (Signed)
[name] , Thank you for taking time to come for your Medicare Wellness Visit. I appreciate your ongoing commitment to your health goals. Please review the following plan we discussed and let me know if I can assist you in the future.   Screening recommendations/referrals: Colonoscopy: Up to date, completed 07/20/2013, due 07/2023 Recommended yearly ophthalmology/optometry visit for glaucoma screening and checkup Recommended yearly dental visit for hygiene and checkup  Vaccinations: Influenza vaccine: Up to date, completed 01/09/2020, due 12/2020 Pneumococcal vaccine: Completed series Tdap vaccine: Up to date, completed 05/29/2014, due 05/2024 Shingles vaccine: Completed series   Covid-19: Completed series  Advanced directives: Advance directive discussed with you today. Even though you declined this today please call our office should you change your mind and we can give you the proper paperwork for you to fill out.  Conditions/risks identified: diabetes, hypertension, hyperlipidemia   Next appointment: Follow up in one year for your annual wellness visit.   Preventive Care 69 Years and Older, Male Preventive care refers to lifestyle choices and visits with your health care provider that can promote health and wellness. What does preventive care include?  A yearly physical exam. This is also called an annual well check.  Dental exams once or twice a year.  Routine eye exams. Ask your health care provider how often you should have your eyes checked.  Personal lifestyle choices, including:  Daily care of your teeth and gums.  Regular physical activity.  Eating a healthy diet.  Avoiding tobacco and drug use.  Limiting alcohol use.  Practicing safe sex.  Taking low doses of aspirin every day.  Taking vitamin and mineral supplements as recommended by your health care provider. What happens during an annual well check? The services and screenings done by your health care  provider during your annual well check will depend on your age, overall health, lifestyle risk factors, and family history of disease. Counseling  Your health care provider may ask you questions about your:  Alcohol use.  Tobacco use.  Drug use.  Emotional well-being.  Home and relationship well-being.  Sexual activity.  Eating habits.  History of falls.  Memory and ability to understand (cognition).  Work and work Statistician. Screening  You may have the following tests or measurements:  Height, weight, and BMI.  Blood pressure.  Lipid and cholesterol levels. These may be checked every 5 years, or more frequently if you are over 22 years old.  Skin check.  Lung cancer screening. You may have this screening every year starting at age 41 if you have a 30-pack-year history of smoking and currently smoke or have quit within the past 15 years.  Fecal occult blood test (FOBT) of the stool. You may have this test every year starting at age 104.  Flexible sigmoidoscopy or colonoscopy. You may have a sigmoidoscopy every 5 years or a colonoscopy every 10 years starting at age 57.  Prostate cancer screening. Recommendations will vary depending on your family history and other risks.  Hepatitis C blood test.  Hepatitis B blood test.  Sexually transmitted disease (STD) testing.  Diabetes screening. This is done by checking your blood sugar (glucose) after you have not eaten for a while (fasting). You may have this done every 1-3 years.  Abdominal aortic aneurysm (AAA) screening. You may need this if you are a current or former smoker.  Osteoporosis. You may be screened starting at age 23 if you are at high risk. Talk with your health care provider about  your test results, treatment options, and if necessary, the need for more tests. Vaccines  Your health care provider may recommend certain vaccines, such as:  Influenza vaccine. This is recommended every year.  Tetanus,  diphtheria, and acellular pertussis (Tdap, Td) vaccine. You may need a Td booster every 10 years.  Zoster vaccine. You may need this after age 32.  Pneumococcal 13-valent conjugate (PCV13) vaccine. One dose is recommended after age 30.  Pneumococcal polysaccharide (PPSV23) vaccine. One dose is recommended after age 31. Talk to your health care provider about which screenings and vaccines you need and how often you need them. This information is not intended to replace advice given to you by your health care provider. Make sure you discuss any questions you have with your health care provider. Document Released: 05/03/2015 Document Revised: 12/25/2015 Document Reviewed: 02/05/2015 Elsevier Interactive Patient Education  2017 [place] Prevention in the Home Falls can cause injuries. They can happen to people of all ages. There are many things you can do to make your home safe and to help prevent falls. What can I do on the outside of my home?  Regularly fix the edges of walkways and driveways and fix any cracks.  Remove anything that might make you trip as you walk through a door, such as a raised step or threshold.  Trim any bushes or trees on the path to your home.  Use bright outdoor lighting.  Clear any walking paths of anything that might make someone trip, such as rocks or tools.  Regularly check to see if handrails are loose or broken. Make sure that both sides of any steps have handrails.  Any raised decks and porches should have guardrails on the edges.  Have any leaves, snow, or ice cleared regularly.  Use sand or salt on walking paths during winter.  Clean up any spills in your garage right away. This includes oil or grease spills. What can I do in the bathroom?  Use night lights.  Install grab bars by the toilet and in the tub and shower. Do not use towel bars as grab bars.  Use non-skid mats or decals in the tub or shower.  If you need to sit down in  the shower, use a plastic, non-slip stool.  Keep the floor dry. Clean up any water that spills on the floor as soon as it happens.  Remove soap buildup in the tub or shower regularly.  Attach bath mats securely with double-sided non-slip rug tape.  Do not have throw rugs and other things on the floor that can make you trip. What can I do in the bedroom?  Use night lights.  Make sure that you have a light by your bed that is easy to reach.  Do not use any sheets or blankets that are too big for your bed. They should not hang down onto the floor.  Have a firm chair that has side arms. You can use this for support while you get dressed.  Do not have throw rugs and other things on the floor that can make you trip. What can I do in the kitchen?  Clean up any spills right away.  Avoid walking on wet floors.  Keep items that you use a lot in easy-to-reach places.  If you need to reach something above you, use a strong step stool that has a grab bar.  Keep electrical cords out of the way.  Do not use floor polish or wax  that makes floors slippery. If you must use wax, use non-skid floor wax.  Do not have throw rugs and other things on the floor that can make you trip. What can I do with my stairs?  Do not leave any items on the stairs.  Make sure that there are handrails on both sides of the stairs and use them. Fix handrails that are broken or loose. Make sure that handrails are as long as the stairways.  Check any carpeting to make sure that it is firmly attached to the stairs. Fix any carpet that is loose or worn.  Avoid having throw rugs at the top or bottom of the stairs. If you do have throw rugs, attach them to the floor with carpet tape.  Make sure that you have a light switch at the top of the stairs and the bottom of the stairs. If you do not have them, ask someone to add them for you. What else can I do to help prevent falls?  Wear shoes that:  Do not have high  heels.  Have rubber bottoms.  Are comfortable and fit you well.  Are closed at the toe. Do not wear sandals.  If you use a stepladder:  Make sure that it is fully opened. Do not climb a closed stepladder.  Make sure that both sides of the stepladder are locked into place.  Ask someone to hold it for you, if possible.  Clearly mark and make sure that you can see:  Any grab bars or handrails.  First and last steps.  Where the edge of each step is.  Use tools that help you move around (mobility aids) if they are needed. These include:  Canes.  Walkers.  Scooters.  Crutches.  Turn on the lights when you go into a dark area. Replace any light bulbs as soon as they burn out.  Set up your furniture so you have a clear path. Avoid moving your furniture around.  If any of your floors are uneven, fix them.  If there are any pets around you, be aware of where they are.  Review your medicines with your doctor. Some medicines can make you feel dizzy. This can increase your chance of falling. Ask your doctor what other things that you can do to help prevent falls. This information is not intended to replace advice given to you by your health care provider. Make sure you discuss any questions you have with your health care provider. Document Released: 01/31/2009 Document Revised: 09/12/2015 Document Reviewed: 05/11/2014 Elsevier Interactive Patient Education  2017 [employer].

## 2020-06-04 NOTE — Progress Notes (Signed)
Subjective:   Alan Morrison is a 70 y.o. male who presents for Medicare Annual/Subsequent preventive examination.  Review of Systems: N/A     I connected with the patient today by telephone and verified that I am speaking with the correct person using two identifiers. Location patient: home Location nurse: work Persons participating in the telephone visit: patient, nurse.   I discussed the limitations, risks, security and privacy concerns of performing an evaluation and management service by telephone and the availability of in person appointments. I also discussed with the patient that there may be a patient responsible charge related to this service. The patient expressed understanding and verbally consented to this telephonic visit.        Cardiac Risk Factors include: advanced age (>50men, >70 women);male gender;diabetes mellitus;hypertension;Other (see comment), Risk factor comments: hyperlipidemia     Objective:    Today's Vitals   There is no height or weight on file to calculate BMI.  Advanced Directives 06/04/2020 11/12/2019 04/01/2017 02/11/2017  Does Patient Have a Medical Advance Directive? Yes No Yes Yes  Type of Paramedic of [place];Living will - [place];Living will -  Does patient want to make changes to medical advance directive? - - No - Patient declined -  Copy of [place] in Chart? No - copy requested - No - copy requested -  Would patient like information on creating a medical advance directive? - No - Patient declined - -    Current Medications (verified) Outpatient Encounter Medications as of 06/04/2020  Medication Sig  . acetaminophen (TYLENOL) 500 MG tablet Take 1,000 mg by mouth 2 (two) times daily as needed for headache (pain).  [name] aspirin EC 81 MG tablet Take 81 mg by mouth daily with supper. Swallow whole.  . Calcium Carbonate Antacid (TUMS PO) Take 1 tablet by mouth daily as needed  (acid reflux/indigestion).  . Cholecalciferol (VITAMIN D) 2000 units tablet Take 2,000 Units by mouth daily with breakfast.   . clopidogrel (PLAVIX) 75 MG tablet Take 1 tablet (75 mg total) by mouth daily with breakfast.  . finasteride (PROSCAR) 5 MG tablet Take 1 tablet (5 mg total) by mouth daily.  . Insulin Glargine (BASAGLAR KWIKPEN) 100 UNIT/ML Inject 0.4 mLs (40 Units total) into the skin at bedtime.  . Insulin Pen Needle (B-D ULTRAFINE III SHORT PEN) 31G X 8 MM MISC 1 Device by Other route 4 (four) times daily. USE AS DIRECTED DAILY  . losartan (COZAAR) 25 MG tablet Take 1 tablet (25 mg total) by mouth daily.  . metFORMIN (GLUCOPHAGE) 1000 MG tablet Take 1,000 mg by mouth daily with breakfast.  . Multiple Vitamin (MULTIVITAMIN WITH MINERALS) TABS tablet Take 1 tablet by mouth daily with breakfast.   . ONETOUCH ULTRA test strip USE TO CHECK SUGARS DAILY  AS DIRECTED  . probenecid (BENEMID) 500 MG tablet Take 0.5 tablets (250 mg total) by mouth every other day.  . rosuvastatin (CRESTOR) 40 MG tablet Take 1 tablet (40 mg total) by mouth daily with supper.  . tamsulosin (FLOMAX) 0.4 MG CAPS capsule Take 1 capsule (0.4 mg total) by mouth daily.  . vitamin C (ASCORBIC ACID) 500 MG tablet Take 500 mg by mouth daily with supper.   . metoprolol tartrate (LOPRESSOR) 25 MG tablet TAKE 0.5 TABLETS (12.5 MG TOTAL) BY MOUTH 2 (TWO) TIMES DAILY.  . nitroGLYCERIN (NITROSTAT) 0.4 MG SL tablet Place 1 tablet (0.4 mg total) under the tongue every 5 (five) minutes  as needed for chest pain.   No facility-administered encounter medications on file as of 06/04/2020.    Allergies (verified) Victoza [liraglutide]   History: Past Medical History:  Diagnosis Date  . Anemia, iron deficiency, inadequate dietary intake   . 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   by [place]  . CAD (coronary artery disease) 2003   cath - Min Dz, EF 65%, Adm.R/O'd  . Cervical spondylosis without myelopathy   . Chest pain, atypical    . Choroidal nevus 01/22/2011   left, yearly eye exam, no diabetic retinopathy  . Diabetes mellitus type II   . Dyspepsia   . Ex-smoker   . Fatty liver 02/2016   by [place]  . GERD (gastroesophageal reflux disease)   . Headache(784.0)   . Heart murmur    Hx of  . History of MRI of brain and brain stem 09/06  . History of MRSA infection 12/2013   R knee boil  . HLD (hyperlipidemia)   . Hyperuricemia   . Obesity   . Sleep apnea   . Stress-induced cardiomyopathy 09/27/98   WNL, EF 64%  . Urosepsis 01/01-01/05/10   Hospitalization   Past Surgical History:  Procedure Laterality Date  . CARDIAC CATHETERIZATION    . COLONOSCOPY  07/2013   1 benign polyp rpt 10 yrs [name])  . CORONARY THROMBECTOMY N/A 11/12/2019   Procedure: Coronary Thrombectomy;  Surgeon: Belva Crome, MD;  Location: [facility];  Service: Cardiovascular;  Laterality: N/A;  . CORONARY/GRAFT ACUTE MI REVASCULARIZATION N/A 11/12/2019   Procedure: Coronary/Graft Acute MI Revascularization;  Surgeon: Belva Crome, MD;  Location: [facility];  Service: Cardiovascular;  Laterality: N/A;  . KNEE ARTHROSCOPY Left 1988  . KNEE SURGERY Right 05/21/03   Right, Dr. Marlou Sa, med meniscus tear via MRI  . LEFT HEART CATH AND CORONARY ANGIOGRAPHY N/A 04/01/2017   Procedure: LEFT HEART CATH AND CORONARY ANGIOGRAPHY;  Surgeon: Martinique, Peter M, MD;  Location: [facility];  Service: Cardiovascular;  Laterality: N/A;  . LEFT HEART CATH AND CORONARY ANGIOGRAPHY N/A 11/12/2019   Procedure: LEFT HEART CATH AND CORONARY ANGIOGRAPHY;  Surgeon: Belva Crome, MD;  Location: [facility];  Service: Cardiovascular;  Laterality: N/A;  . MYELOGRAM  08/05   bulging disc   Family History  Problem Relation Age of Onset  . Heart disease Mother        CHF  . Diabetes Mother   . Heart disease Father        CHF  . Hypertension Father   . Migraines Brother        severe headaches from arsenic in the past from  wood that was  treated on his deck  . CAD Brother 21       stent  . Cancer Maternal Uncle        unsure  . Stroke Neg Hx   . Colon cancer Neg Hx    Social History   Socioeconomic History  . Marital status: Married    Spouse name: Not on file  . Number of children: 2  . Years of education: 42  . Highest education level: High school graduate  Occupational History  . Occupation: Maintenance    Employer: [employer]  Tobacco Use  . Smoking status: Former Smoker    Quit date: 04/20/1974    Years since quitting: 46.1  . Smokeless tobacco: Never Used  . Tobacco comment: quit over 20 years  Vaping  Use  . Vaping Use: Never used  Substance and Sexual Activity  . Alcohol use: Yes    Alcohol/week: 3.0 standard drinks    Types: 3 Cans of beer per week    Comment: occasional  . Drug use: No  . Sexual activity: Not on file  Other Topics Concern  . Not on file  Social History Narrative   Lives with wife   Occupation: equipment maintenance   Activity: no regular exercise - hunts   Diet: good water, fruits/vegetables   Social Determinants of Health   Financial Resource Strain: Low Risk   . Difficulty of Paying Living Expenses: Not hard at all  Food Insecurity: No Food Insecurity  . Worried About Charity fundraiser in the Last Year: Never true  . Ran Out of Food in the Last Year: Never true  Transportation Needs: No Transportation Needs  . Lack of Transportation (Medical): No  . Lack of Transportation (Non-Medical): No  Physical Activity: Inactive  . Days of Exercise per Week: 0 days  . Minutes of Exercise per Session: 0 min  Stress: No Stress Concern Present  . Feeling of Stress : Not at all  Social Connections: Not on file    Tobacco Counseling Counseling given: Not Answered Comment: quit over 20 years   Clinical Intake:  Pre-visit preparation completed: Yes  Pain : No/denies pain     Nutritional Risks: None Diabetes: Yes CBG done?: No Did pt. bring in CBG monitor  from home?: No  How often do you need to have someone help you when you read instructions, pamphlets, or other written materials from your doctor or pharmacy?: 1 - Never What is the last grade level you completed in school?: 12th  Diabetic: Yes Nutrition Risk Assessment:  Has the patient had any N/V/D within the last 2 months?  No  Does the patient have any non-healing wounds?  No  Has the patient had any unintentional weight loss or weight gain?  No   Diabetes:  Is the patient diabetic?  Yes  If diabetic, was a CBG obtained today?  No  telephone visit Did the patient bring in their glucometer from home?  No  telephone visit  How often do you monitor your CBG's? daily.   Financial Strains and Diabetes Management:  Are you having any financial strains with the device, your supplies or your medication? No .  Does the patient want to be seen by Chronic Care Management for management of their diabetes?  No  Would the patient like to be referred to a Nutritionist or for Diabetic Management?  No   Diabetic Exams:  Diabetic Eye Exam: Completed 10/19/2019 Diabetic Foot Exam: Completed 11/30/2019   Interpreter Needed?: No  Information entered by :: CJohnson, LPN   Activities of Daily Living In your present state of health, do you have any difficulty performing the following activities: 06/04/2020  Hearing? N  Vision? N  Difficulty concentrating or making decisions? N  Walking or climbing stairs? N  Dressing or bathing? N  Doing errands, shopping? N  Preparing Food and eating ? N  Using the Toilet? N  In the past six months, have you accidently leaked urine? N  Do you have problems with loss of bowel control? N  Managing your Medications? N  Managing your Finances? N  Housekeeping or managing your Housekeeping? N  Some recent data might be hidden    Patient Care Team: [name], MD as PCP - General (Family Medicine) Martinique,  Ander Slade, MD as PCP - Cardiology  (Cardiology) Martinique, Peter M, MD as Consulting Physician (Cardiology) Renato Shin, MD as Consulting Physician (Endocrinology) Magrinat, Virgie Dad, MD as Consulting Physician (Hematology and Oncology) Belva Crome, MD as Consulting Physician (Cardiology) Marica Otter, OD (Optometry)  Indicate any recent Medical Services you may have received from other than [facility] providers in the past year (date may be approximate).     Assessment:   This is a routine wellness examination for [place].  Hearing/Vision screen  Hearing Screening   125Hz  250Hz  500Hz  1000Hz  2000Hz  3000Hz  4000Hz  6000Hz  8000Hz   Right ear:           Left ear:           Vision Screening Comments: Patient gets annual eye exams   Dietary issues and exercise activities discussed: Current Exercise Habits: The patient does not participate in regular exercise at present, Exercise limited by: None identified  Goals    . Patient Stated     06/04/2020, I will maintain and continue medications as prescribed.       Depression Screen PHQ 2/9 Scores 06/04/2020 12/28/2019 05/31/2019 02/02/2019 10/18/2017 02/11/2017 10/22/2016  PHQ - 2 Score 0 0 1 0 0 0 1  PHQ- 9 Score 0 - 6 - - - -    Fall Risk Fall Risk  06/04/2020 05/31/2019 02/02/2019 02/11/2017  Falls in the past year? 0 0 0 No  Number falls in past yr: 0 - - -  Injury with Fall? 0 - - -  Risk for fall due to : Medication side effect - - -  Follow up Falls evaluation completed;Falls prevention discussed - - -    FALL RISK PREVENTION PERTAINING TO THE HOME:  Any stairs in or around the home? Yes  If so, are there any without handrails? No  Home free of loose throw rugs in walkways, pet beds, electrical cords, etc? Yes  Adequate lighting in your home to reduce risk of falls? Yes   ASSISTIVE DEVICES UTILIZED TO PREVENT FALLS:  Life alert? No  Use of a cane, walker or w/c? No  Grab bars in the bathroom? Yes  Shower chair or bench in shower? No  Elevated toilet seat or a  handicapped toilet? No   TIMED UP AND GO:  Was the test performed? N/A telephone visit .   Cognitive Function: MMSE - Mini Mental State Exam 06/04/2020  Orientation to time 5  Orientation to Place 5  Registration 3  Attention/ Calculation 5  Recall 3  Language- repeat 1       Mini Cog  Mini-Cog screen was completed. Maximum score is 22. A value of 0 denotes this part of the MMSE was not completed or the patient failed this part of the Mini-Cog screening.  Immunizations Immunization History  Administered Date(s) Administered  . Influenza Whole 01/22/2009  . Influenza, High Dose Seasonal PF 12/27/2017, 12/20/2018, 01/09/2020  . Influenza, Seasonal, Injecte, Preservative Fre 01/18/2014  . Influenza,inj,Quad PF,6+ Mos 01/20/2015  . Influenza-Unspecified 01/18/2013, 01/18/2014, 01/30/2016, 01/06/2017  . PFIZER(Purple Top)SARS-COV-2 Vaccination 05/28/2019, 06/22/2019, 01/09/2020  . Pneumococcal Conjugate-13 10/22/2016  . Pneumococcal Polysaccharide-23 05/06/2011, 10/18/2017  . Td 08/21/2004  . Tdap 05/29/2014  . Zoster 08/23/2013  . Zoster Recombinat (Shingrix) 04/14/2017, 06/18/2017    TDAP status: Up to date  Flu Vaccine status: Up to date  Pneumococcal vaccine status: Up to date  Covid-19 vaccine status: Completed vaccines  Qualifies for Shingles Vaccine? Yes   Zostavax completed Yes  Shingrix Completed?: Yes  Screening Tests Health Maintenance  Topic Date Due  . HEMOGLOBIN A1C  05/15/2020  . OPHTHALMOLOGY EXAM  10/18/2020  . FOOT EXAM  11/29/2020  . COLONOSCOPY (Pts 45-105yrs Insurance coverage will need to be confirmed)  07/21/2023  . TETANUS/TDAP  05/29/2024  . INFLUENZA VACCINE  Completed  . COVID-19 Vaccine  Completed  . Hepatitis C Screening  Completed  . PNA vac Low Risk Adult  Completed    Health Maintenance  Health Maintenance Due  Topic Date Due  . HEMOGLOBIN A1C  05/15/2020    Colorectal cancer screening: Type of screening: Colonoscopy.  Completed 07/20/2013. Repeat every 10 years  Lung Cancer Screening: (Low Dose CT Chest recommended if Age 19-80 years, 30 pack-year currently smoking OR have quit w/in 15years.) does not qualify.    Additional Screening:  Hepatitis C Screening: does qualify; Completed 05/27/2015  Vision Screening: Recommended annual ophthalmology exams for early detection of glaucoma and other disorders of the eye. Is the patient up to date with their annual eye exam?  Yes  Who is the provider or what is the name of the office in which the patient attends annual eye exams? Dr. Marica Otter If pt is not established with a provider, would they like to be referred to a provider to establish care? No .   Dental Screening: Recommended annual dental exams for proper oral hygiene  Community Resource Referral / Chronic Care Management: CRR required this visit?  No   CCM required this visit?  No      Plan:     I have personally reviewed and noted the following in the patient's chart:   . Medical and social history . Use of alcohol, tobacco or illicit drugs  . Current medications and supplements . Functional ability and status . Nutritional status . Physical activity . Advanced directives . List of other physicians . Hospitalizations, surgeries, and ER visits in previous 12 months . Vitals . Screenings to include cognitive, depression, and falls . Referrals and appointments  In addition, I have reviewed and discussed with patient certain preventive protocols, quality metrics, and best practice recommendations. A written personalized care plan for preventive services as well as general preventive health recommendations were provided to patient.   Due to this being a telephonic visit, the after visit summary with patients personalized plan was offered to patient via office or my-chart. Patient preferred to pick up at office at next visit or via mychart.   Andrez Grime, LPN   4/43/1540

## 2020-06-04 NOTE — Progress Notes (Signed)
PCP notes:  Health Maintenance: No gaps noted   Abnormal Screenings: No gaps noted   Patient concerns: none   Nurse concerns: None   Next PCP appt: 06/10/2020 @ 9:30 am

## 2020-06-10 ENCOUNTER — Encounter: Payer: Self-pay | Admitting: Family Medicine

## 2020-06-10 ENCOUNTER — Ambulatory Visit (INDEPENDENT_AMBULATORY_CARE_PROVIDER_SITE_OTHER): Payer: Medicare HMO | Admitting: Family Medicine

## 2020-06-10 ENCOUNTER — Other Ambulatory Visit: Payer: Self-pay

## 2020-06-10 VITALS — BP 118/62 | HR 75 | Temp 97.9°F | Ht 66.5 in | Wt 195.2 lb

## 2020-06-10 DIAGNOSIS — N138 Other obstructive and reflux uropathy: Secondary | ICD-10-CM

## 2020-06-10 DIAGNOSIS — E785 Hyperlipidemia, unspecified: Secondary | ICD-10-CM

## 2020-06-10 DIAGNOSIS — E559 Vitamin D deficiency, unspecified: Secondary | ICD-10-CM | POA: Diagnosis not present

## 2020-06-10 DIAGNOSIS — E1142 Type 2 diabetes mellitus with diabetic polyneuropathy: Secondary | ICD-10-CM

## 2020-06-10 DIAGNOSIS — I2510 Atherosclerotic heart disease of native coronary artery without angina pectoris: Secondary | ICD-10-CM

## 2020-06-10 DIAGNOSIS — E669 Obesity, unspecified: Secondary | ICD-10-CM

## 2020-06-10 DIAGNOSIS — Z0000 Encounter for general adult medical examination without abnormal findings: Secondary | ICD-10-CM

## 2020-06-10 DIAGNOSIS — E1169 Type 2 diabetes mellitus with other specified complication: Secondary | ICD-10-CM | POA: Diagnosis not present

## 2020-06-10 DIAGNOSIS — N401 Enlarged prostate with lower urinary tract symptoms: Secondary | ICD-10-CM | POA: Diagnosis not present

## 2020-06-10 DIAGNOSIS — Z7189 Other specified counseling: Secondary | ICD-10-CM

## 2020-06-10 DIAGNOSIS — I10 Essential (primary) hypertension: Secondary | ICD-10-CM

## 2020-06-10 MED ORDER — TAMSULOSIN HCL 0.4 MG PO CAPS: 0.4000 mg | ORAL_CAPSULE | Freq: Every day | ORAL | 3 refills | Status: DC

## 2020-06-10 MED ORDER — METFORMIN HCL 1000 MG PO TABS: 1000.0000 mg | ORAL_TABLET | Freq: Every day | ORAL | 3 refills | Status: DC

## 2020-06-10 MED ORDER — PROBENECID 500 MG PO TABS: 250.0000 mg | ORAL_TABLET | ORAL | 3 refills | Status: DC

## 2020-06-10 MED ORDER — FINASTERIDE 5 MG PO TABS: 5.0000 mg | ORAL_TABLET | Freq: Every day | ORAL | 3 refills | Status: DC

## 2020-06-10 MED ORDER — LOSARTAN POTASSIUM 25 MG PO TABS: 25.0000 mg | ORAL_TABLET | Freq: Every day | ORAL | 3 refills | Status: DC

## 2020-06-10 NOTE — Patient Instructions (Addendum)
Work on sugar levels - go back to low sugar low carb diabetic diet.  Bring [place] copy of advanced directive.  Return as needed or in 6 months for diabetes follow up visit.   The 15-15 rule for low sugars: If sugar reading below 70, have 15 grams of carbohydrate to raise your blood sugar and check it after 15 minutes. If it's still below 70 mg/dL, have another serving. 15 grams of carbs may be: -Glucose tablets (see instructions) -Gel tube (see instructions) -4 ounces (1/2 cup) of juice or regular soda (not diet) -1 tablespoon of sugar, honey, or corn syrup -Hard candies, jellybeans or gumdrops-see food label for how many to consume  Repeat these steps until your blood sugar is at least 70 mg/dL. Once your blood sugar is back to normal, eat a meal or snack to make sure it doesn't lower again.   Health Maintenance After Age 65 After age 28, you are at a higher risk for certain long-term diseases and infections as well as injuries from falls. Falls are a major cause of broken bones and head injuries in people who are older than age 32. Getting regular preventive care can help to keep you healthy and well. Preventive care includes getting regular testing and making lifestyle changes as recommended by your health care provider. Talk with your health care provider about:  Which screenings and tests you should have. A screening is a test that checks for a disease when you have no symptoms.  A diet and exercise plan that is right for you. What should I know about screenings and tests to prevent falls? Screening and testing are the best ways to find a health problem early. Early diagnosis and treatment give you the best chance of managing medical conditions that are common after age 65. Certain conditions and lifestyle choices may make you more likely to have a fall. Your health care provider may recommend:  Regular vision checks. Poor vision and conditions such as cataracts can make you more likely to  have a fall. If you wear glasses, make sure to get your prescription updated if your vision changes.  Medicine review. Work with your health care provider to regularly review all of the medicines you are taking, including over-the-counter medicines. Ask your health care provider about any side effects that may make you more likely to have a fall. Tell your health care provider if any medicines that you take make you feel dizzy or sleepy.  Osteoporosis screening. Osteoporosis is a condition that causes the bones to get weaker. This can make the bones weak and cause them to break more easily.  Blood pressure screening. Blood pressure changes and medicines to control blood pressure can make you feel dizzy.  Strength and balance checks. Your health care provider may recommend certain tests to check your strength and balance while standing, walking, or changing positions.  Foot health exam. Foot pain and numbness, as well as not wearing proper footwear, can make you more likely to have a fall.  Depression screening. You may be more likely to have a fall if you have a fear of falling, feel emotionally low, or feel unable to do activities that you used to do.  Alcohol use screening. Using too much alcohol can affect your balance and may make you more likely to have a fall. What actions can I take to lower my risk of falls? General instructions  Talk with your health care provider about your risks for falling. Tell your  health care provider if: ? You fall. Be sure to tell your health care provider about all falls, even ones that seem minor. ? You feel dizzy, sleepy, or off-balance.  Take over-the-counter and prescription medicines only as told by your health care provider. These include any supplements.  Eat a healthy diet and maintain a healthy weight. A healthy diet includes low-fat dairy products, low-fat (lean) meats, and fiber from whole grains, beans, and lots of fruits and vegetables. Home  safety  Remove any tripping hazards, such as rugs, cords, and clutter.  Install safety equipment such as grab bars in bathrooms and safety rails on stairs.  Keep rooms and walkways well-lit. Activity  Follow a regular exercise program to stay fit. This will help you maintain your balance. Ask your health care provider what types of exercise are appropriate for you.  If you need a cane or walker, use it as recommended by your health care provider.  Wear supportive shoes that have nonskid soles.   Lifestyle  Do not drink alcohol if your health care provider tells you not to drink.  If you drink alcohol, limit how much you have: ? 0-1 drink a day for women. ? 0-2 drinks a day for men.  Be aware of how much alcohol is in your drink. In the [place] one drink equals one typical bottle of beer (12 oz), one-half glass of wine (5 oz), or one shot of hard liquor (1 oz).  Do not use any products that contain nicotine or tobacco, such as cigarettes and e-cigarettes. If you need help quitting, ask your health care provider. Summary  Having a healthy lifestyle and getting preventive care can help to protect your health and wellness after age 58.  Screening and testing are the best way to find a health problem early and help you avoid having a fall. Early diagnosis and treatment give you the best chance for managing medical conditions that are more common for people who are older than age 77.  Falls are a major cause of broken bones and head injuries in people who are older than age 87. Take precautions to prevent a fall at home.  Work with your health care provider to learn what changes you can make to improve your health and wellness and to prevent falls. This information is not intended to replace advice given to you by your health care provider. Make sure you discuss any questions you have with your health care provider. Document Revised: 07/28/2018 Document Reviewed: 02/17/2017 Elsevier  Patient Education  2021 [employer].

## 2020-06-10 NOTE — Assessment & Plan Note (Signed)
Encourage healthy diet and lifestyle choices to affect sustainable weight loss.  

## 2020-06-10 NOTE — Assessment & Plan Note (Signed)
Continue DAPT x1 yr post MI (10/2019).  Appreciate cards care.

## 2020-06-10 NOTE — Progress Notes (Signed)
Patient ID: Alan Morrison, male    DOB: [birth year], 70 y.o.   MRN: [id number]  This visit was conducted in person.  BP 118/62   Pulse 75   Temp 97.9 F (36.6 C) (Temporal)   Ht 5' 6.5" (1.689 m)   Wt 195 lb 3 oz (88.5 kg)   SpO2 96%   BMI 31.03 kg/m    CC: CPE Subjective:   HPI: Alan Morrison is a 70 y.o. male presenting on 06/10/2020 for Annual Exam (Prt 2.   Requests printed rxs for refills. )   Saw health advisor last week for medicare wellness visit. Note reviewed.   No exam data present  Flowsheet Row Clinical Support from 06/04/2020 in [facility]  PHQ-2 Total Score 0      Fall Risk  06/04/2020 05/31/2019 02/02/2019 02/11/2017  Falls in the past year? 0 0 0 No  Number falls in past yr: 0 - - -  Injury with Fall? 0 - - -  Risk for fall due to : Medication side effect - - -  Follow up Falls evaluation completed;Falls prevention discussed - - -    Recent MI (inferior wall STEMI) 10/2019 treated with DES continues DAPT aspirin/plavix x1 yr total.   H/o L femoral DVT 04/2018 s/p xarelto treatment through 11/2019. No unilateral leg swelling or dyspnea.   DM - established with [facility] endocrinology Honor Junes) last seen 02/2020 - on metformin 1000mg  daily, basaglar 50u- raised to 75u daily. Attributes recent hyperglycemia to holiday diet. Has endo f/u next week. cbg 86 this morning. 1 low sugar to 58 last week, managed with glucose tablet.   Preventative: COLONOSCOPY Date: 07/2013 1 benign polyp rpt 10 yrs [name])  Prostate -normal in the past. Would like to continue checking yearly. No prostate symptoms.  Lung cancer screening - not eligible  AAA screen - no fmhx - not eligible  Flu shot yearly at work  [place] 2/201, 06/2019, 12/2019 Pneumovax 2013, 2019, prevnar 2018. Tdap 2016 Zostavax- 08/2013 Shingrix -completed series 2019 Advanced planning discussion. Has at home. Wife is HCPOA. We have asked him to bring [place] a  copy. Seat belt use discussed. Sunscreen use discussed. No changing moles. Exsmoker- quit remotely - 1972 - 4 PY hx  Alcohol - occasional beer  Dentist - Q42mo  Eye exam - yearly  Bowel - no constipation Bladder - no incontinence  Lives with wife  Occupation: equipment maintenance  Activity: [facility] [employer]  Diet:goodwater, daily fruits/vegetables, diet drinks     Relevant past medical, surgical, family and social history reviewed and updated as indicated. Interim medical history since our last visit reviewed. Allergies and medications reviewed and updated. Outpatient Medications Prior to Visit  Medication Sig Dispense Refill  . acetaminophen (TYLENOL) 500 MG tablet Take 1,000 mg by mouth 2 (two) times daily as needed for headache (pain).    [name] aspirin EC 81 MG tablet Take 81 mg by mouth daily with supper. Swallow whole.    . Calcium Carbonate Antacid (TUMS PO) Take 1 tablet by mouth daily as needed (acid reflux/indigestion).    . Cholecalciferol (VITAMIN D) 2000 units tablet Take 2,000 Units by mouth daily with breakfast.     . clopidogrel (PLAVIX) 75 MG tablet Take 1 tablet (75 mg total) by mouth daily with breakfast. 90 tablet 3  . Insulin Pen Needle (B-D ULTRAFINE III SHORT PEN) 31G X 8 MM MISC 1 Device by Other route 4 (four)  times daily. USE AS DIRECTED DAILY 120 each 11  . Multiple Vitamin (MULTIVITAMIN WITH MINERALS) TABS tablet Take 1 tablet by mouth daily with breakfast.     . ONETOUCH ULTRA test strip USE TO CHECK SUGARS DAILY  AS DIRECTED 100 strip 3  . rosuvastatin (CRESTOR) 40 MG tablet Take 1 tablet (40 mg total) by mouth daily with supper. 90 tablet 3  . vitamin C (ASCORBIC ACID) 500 MG tablet Take 500 mg by mouth daily with supper.     . finasteride (PROSCAR) 5 MG tablet Take 1 tablet (5 mg total) by mouth daily. 90 tablet 3  . Insulin Glargine (BASAGLAR KWIKPEN) 100 UNIT/ML Inject 0.4 mLs (40 Units total) into the skin at bedtime.    [name] losartan (COZAAR)  25 MG tablet Take 1 tablet (25 mg total) by mouth daily. 90 tablet 3  . metFORMIN (GLUCOPHAGE) 1000 MG tablet Take 1,000 mg by mouth daily with breakfast.    . probenecid (BENEMID) 500 MG tablet Take 0.5 tablets (250 mg total) by mouth every other day. 45 tablet 3  . tamsulosin (FLOMAX) 0.4 MG CAPS capsule Take 1 capsule (0.4 mg total) by mouth daily. 90 capsule 3  . Insulin Glargine (BASAGLAR KWIKPEN) 100 UNIT/ML Inject 75 Units into the skin at bedtime.    . metoprolol tartrate (LOPRESSOR) 25 MG tablet TAKE 0.5 TABLETS (12.5 MG TOTAL) BY MOUTH 2 (TWO) TIMES DAILY. 90 tablet 3  . nitroGLYCERIN (NITROSTAT) 0.4 MG SL tablet Place 1 tablet (0.4 mg total) under the tongue every 5 (five) minutes as needed for chest pain. 25 tablet 11   No facility-administered medications prior to visit.     Per HPI unless specifically indicated in ROS section below Review of Systems  Constitutional: Negative for activity change, appetite change, chills, fatigue, fever and unexpected weight change.  HENT: Negative for hearing loss.   Eyes: Negative for visual disturbance.  Respiratory: Negative for cough, chest tightness, shortness of breath and wheezing.   Cardiovascular: Negative for chest pain, palpitations and leg swelling.  Gastrointestinal: Negative for abdominal distention, abdominal pain, blood in stool, constipation, diarrhea, nausea and vomiting.  Genitourinary: Negative for difficulty urinating and hematuria.  Musculoskeletal: Negative for arthralgias, myalgias and neck pain.  Skin: Negative for rash.  Neurological: Negative for dizziness, seizures, syncope and headaches.  Hematological: Negative for adenopathy. Bruises/bleeds easily.  Psychiatric/Behavioral: Negative for dysphoric mood. The patient is not nervous/anxious.    Objective:  BP 118/62   Pulse 75   Temp 97.9 F (36.6 C) (Temporal)   Ht 5' 6.5" (1.689 m)   Wt 195 lb 3 oz (88.5 kg)   SpO2 96%   BMI 31.03 kg/m   Wt Readings from  Last 3 Encounters:  06/10/20 195 lb 3 oz (88.5 kg)  03/07/20 193 lb 3.2 oz (87.6 kg)  02/16/20 196 lb 13.9 oz (89.3 kg)      Physical Exam Vitals and nursing note reviewed.  Constitutional:      General: He is not in acute distress.    Appearance: Normal appearance. He is well-developed and well-nourished. He is not ill-appearing.  HENT:     Head: Normocephalic and atraumatic.     Right Ear: Hearing, tympanic membrane, ear canal and external ear normal.     Left Ear: Hearing, tympanic membrane, ear canal and external ear normal.     Mouth/Throat:     Mouth: Oropharynx is clear and moist and mucous membranes are normal.     Pharynx: No posterior  oropharyngeal edema.  Eyes:     General: No scleral icterus.    Extraocular Movements: Extraocular movements intact and EOM normal.     Conjunctiva/sclera: Conjunctivae normal.     Pupils: Pupils are equal, round, and reactive to light.  Neck:     Thyroid: No thyroid mass or thyromegaly.     Vascular: No carotid bruit.  Cardiovascular:     Rate and Rhythm: Normal rate and regular rhythm.     Pulses: Normal pulses and intact distal pulses.          Radial pulses are 2+ on the right side and 2+ on the left side.     Heart sounds: Normal heart sounds. No murmur heard.   Pulmonary:     Effort: Pulmonary effort is normal. No respiratory distress.     Breath sounds: Normal breath sounds. No wheezing, rhonchi or rales.  Abdominal:     General: Abdomen is flat. Bowel sounds are normal. There is no distension.     Palpations: Abdomen is soft. There is no mass.     Tenderness: There is no abdominal tenderness. There is no guarding or rebound.     Hernia: No hernia is present.  Musculoskeletal:        General: No edema. Normal range of motion.     Cervical back: Normal range of motion and neck supple.     Right lower leg: No edema.     Left lower leg: No edema.  Lymphadenopathy:     Cervical: No cervical adenopathy.  Skin:    General:  Skin is warm and dry.     Findings: No rash.  Neurological:     General: No focal deficit present.     Mental Status: He is alert and oriented to person, place, and time.     Comments: CN grossly intact, station and gait intact  Psychiatric:        Mood and Affect: Mood and affect and mood normal.        Behavior: Behavior normal.        Thought Content: Thought content normal.        Judgment: Judgment normal.       Results for orders placed or performed in visit on 06/04/20  PSA  Result Value Ref Range   PSA 0.30 0.10 - 4.00 ng/mL  VITAMIN D 25 Hydroxy (Vit-D Deficiency, Fractures)  Result Value Ref Range   VITD 56.31 30.00 - 100.00 ng/mL  Hemoglobin A1c  Result Value Ref Range   Hgb A1c MFr Bld 10.0 (H) 4.6 - 6.5 %  Lipid panel  Result Value Ref Range   Cholesterol 113 0 - 200 mg/dL   Triglycerides 86.0 0.0 - 149.0 mg/dL   HDL 33.10 (L) >39.00 mg/dL   VLDL 17.2 0.0 - 40.0 mg/dL   LDL Cholesterol 62 0 - 99 mg/dL   Total CHOL/HDL Ratio 3    NonHDL 79.50   Comprehensive metabolic panel  Result Value Ref Range   Sodium 142 135 - 145 mEq/L   Potassium 4.4 3.5 - 5.1 mEq/L   Chloride 109 96 - 112 mEq/L   CO2 25 19 - 32 mEq/L   Glucose, Bld 97 70 - 99 mg/dL   BUN 27 (H) 6 - 23 mg/dL   Creatinine, Ser 1.18 0.40 - 1.50 mg/dL   Total Bilirubin 0.5 0.2 - 1.2 mg/dL   Alkaline Phosphatase 41 39 - 117 U/L   AST 31 0 - 37 U/L  ALT 29 0 - 53 U/L   Total Protein 6.4 6.0 - 8.3 g/dL   Albumin 3.9 3.5 - 5.2 g/dL   GFR 63.20 >60.00 mL/min   Calcium 9.4 8.4 - 10.5 mg/dL   Lab Results  Component Value Date   WBC 6.6 11/20/2019   HGB 15.0 11/20/2019   HCT 42.5 11/20/2019   MCV 87.6 11/20/2019   PLT 222 11/20/2019   Assessment & Plan:  This visit occurred during the SARS-CoV-2 public health emergency.  Safety protocols were in place, including screening questions prior to the visit, additional usage of staff PPE, and extensive cleaning of exam room while observing appropriate  contact time as indicated for disinfecting solutions.   Problem List Items Addressed This Visit    Vitamin D deficiency    Continue D replacement.       Type 2 diabetes, controlled, with peripheral neuropathy (HCC)    Chronic, deteriorated control which pt attributes to dietary liberties during holiday season as well as lack of exercise routine. Motivated to restart diabetic diet and exercise routine.  Has endo f/u scheduled for next month - no changes made today.       Relevant Medications   metFORMIN (GLUCOPHAGE) 1000 MG tablet   losartan (COZAAR) 25 MG tablet   Insulin Glargine (BASAGLAR KWIKPEN) 100 UNIT/ML   Obesity, Class I, BMI 30-34.9    Encourage healthy diet and lifestyle choices to affect sustainable weight loss.       Hyperlipidemia associated with type 2 diabetes mellitus (HCC)    Chronic, stable on crestor. The ASCVD Risk score Mikey Bussing DC Jr., et al., 2013) failed to calculate for the following reasons:   The patient has a prior MI or stroke diagnosis       Relevant Medications   metFORMIN (GLUCOPHAGE) 1000 MG tablet   losartan (COZAAR) 25 MG tablet   Insulin Glargine (BASAGLAR KWIKPEN) 100 UNIT/ML   Health maintenance examination - Primary    Preventative protocols reviewed and updated unless pt declined. Discussed healthy diet and lifestyle.       Essential hypertension, benign    Chronic, stable. Continue current regimen.       Relevant Medications   losartan (COZAAR) 25 MG tablet   CAD (coronary artery disease), native coronary artery    Continue DAPT x1 yr post MI (10/2019).  Appreciate cards care.       Relevant Medications   losartan (COZAAR) 25 MG tablet   Benign prostatic hyperplasia with urinary obstruction    Continues flomax and proscar, PSA stable, asxs.       Relevant Medications   finasteride (PROSCAR) 5 MG tablet   tamsulosin (FLOMAX) 0.4 MG CAPS capsule   Advanced directives, counseling/discussion    Advanced planning discussion. Has  at home. Wife is HCPOA. We have asked him to bring [place] a copy.          Meds ordered this encounter  Medications  . finasteride (PROSCAR) 5 MG tablet    Sig: Take 1 tablet (5 mg total) by mouth daily.    Dispense:  90 tablet    Refill:  3  . metFORMIN (GLUCOPHAGE) 1000 MG tablet    Sig: Take 1 tablet (1,000 mg total) by mouth daily with breakfast.    Dispense:  90 tablet    Refill:  3  . losartan (COZAAR) 25 MG tablet    Sig: Take 1 tablet (25 mg total) by mouth daily.    Dispense:  90 tablet  Refill:  3  . probenecid (BENEMID) 500 MG tablet    Sig: Take 0.5 tablets (250 mg total) by mouth every other day.    Dispense:  45 tablet    Refill:  3  . tamsulosin (FLOMAX) 0.4 MG CAPS capsule    Sig: Take 1 capsule (0.4 mg total) by mouth daily.    Dispense:  90 capsule    Refill:  3   No orders of the defined types were placed in this encounter.   Patient instructions: Work on sugar levels - go back to low sugar low carb diabetic diet.  Bring [place] copy of advanced directive.  Return as needed or in 6 months for diabetes follow up visit.   The 15-15 rule for low sugars: If sugar reading below 70, have 15 grams of carbohydrate to raise your blood sugar and check it after 15 minutes. If it's still below 70 mg/dL, have another serving. 15 grams of carbs may be: -Glucose tablets (see instructions) -Gel tube (see instructions) -4 ounces (1/2 cup) of juice or regular soda (not diet) -1 tablespoon of sugar, honey, or corn syrup -Hard candies, jellybeans or gumdrops--see food label for how many to consume  Repeat these steps until your blood sugar is at least 70 mg/dL. Once your blood sugar is back to normal, eat a meal or snack to make sure it doesn't lower again.   Follow up plan: Return in about 6 months (around 12/08/2020), or if symptoms worsen or fail to improve, for follow up visit.  [name], MD

## 2020-06-10 NOTE — Assessment & Plan Note (Signed)
Advanced planning discussion. Has at home. Wife is HCPOA. We have asked him to bring [place] a copy.

## 2020-06-10 NOTE — Assessment & Plan Note (Signed)
Continues flomax and proscar, PSA stable, asxs.

## 2020-06-10 NOTE — Assessment & Plan Note (Signed)
Chronic, stable. Continue current regimen. 

## 2020-06-10 NOTE — Assessment & Plan Note (Signed)
Preventative protocols reviewed and updated unless pt declined. Discussed healthy diet and lifestyle.  

## 2020-06-10 NOTE — Assessment & Plan Note (Signed)
Continue D replacement.

## 2020-06-10 NOTE — Assessment & Plan Note (Signed)
Chronic, stable on crestor. The ASCVD Risk score Mikey Bussing DC Jr., et al., 2013) failed to calculate for the following reasons:   The patient has a prior MI or stroke diagnosis

## 2020-06-10 NOTE — Assessment & Plan Note (Addendum)
Chronic, deteriorated control which pt attributes to dietary liberties during holiday season as well as lack of exercise routine. Motivated to restart diabetic diet and exercise routine.  Has endo f/u scheduled for next month - no changes made today.

## 2020-06-13 ENCOUNTER — Other Ambulatory Visit: Payer: Self-pay | Admitting: Family Medicine

## 2020-08-05 ENCOUNTER — Other Ambulatory Visit: Payer: Self-pay | Admitting: Cardiology

## 2020-08-08 DIAGNOSIS — Z794 Long term (current) use of insulin: Secondary | ICD-10-CM | POA: Diagnosis not present

## 2020-08-08 DIAGNOSIS — I10 Essential (primary) hypertension: Secondary | ICD-10-CM | POA: Diagnosis not present

## 2020-08-08 DIAGNOSIS — E1165 Type 2 diabetes mellitus with hyperglycemia: Secondary | ICD-10-CM | POA: Diagnosis not present

## 2020-08-08 DIAGNOSIS — E782 Mixed hyperlipidemia: Secondary | ICD-10-CM | POA: Diagnosis not present

## 2020-09-02 DIAGNOSIS — Z794 Long term (current) use of insulin: Secondary | ICD-10-CM | POA: Diagnosis not present

## 2020-09-02 DIAGNOSIS — E1165 Type 2 diabetes mellitus with hyperglycemia: Secondary | ICD-10-CM | POA: Diagnosis not present

## 2020-09-06 NOTE — Progress Notes (Signed)
Cardiology Office Note:    Date:  09/09/2020   ID:  Alan Morrison, DOB [birth year], MRN [id number]  PCP:  Alan Bush, MD  [facility] Cardiologist:  Alan Muraski Martinique, MD  [place] Electrophysiologist:  None   Referring MD: Alan Bush, MD   Chief Complaint  Patient presents with  . Coronary Artery Disease    History of Present Illness:    Alan Morrison is a 70 y.o. male with a hx of HLD, DM II, stress cardiomyopathy, OSA on CPAP, history of DVT on Xarelto, former smoker, and CAD.  Patient had nonobstructive CAD by angiography in 2018.  More recently, he presented with inferior wall acute STEMI on 11/12/2019.  Initial EKG did identify inferior Q waves with persistent ST elevation in the inferior lead.  Cardiac catheterization performed on 11/12/2019 showed total occlusion of proximal to mid RCA treated with 3.0 x 22 mm Onyx DES postdilated to 3.5 mm, 30% mid PDA vessel lesion, widely patent left main, 50% proximal to mid LAD lesion, EF 55%, LVEDP 16 mmHg.  Postprocedure, patient was placed on triple therapy including aspirin, Plavix and Xarelto.  The plan is to discontinue aspirin after 1 month.  Echocardiogram showed EF 45 to 50%.  During the hospitalization patient also had frequent hypoglycemic spells at home, he was not very compliant with insulin at home which explains the hypoglycemic episodes when he was placed on the full-strength insulin.  Lovenox was stopped and he was discharged on long-acting insulin only with plan to be evaluated by his endocrinologist for further medication adjustment.  He was seen by Dr. Jana Morrison on 11/20/2019 who discontinued the Xarelto. He did recommend repeating a D dimer at some point.   On follow up today he is doing very well. Denies chest pain or SOB. He is still quite sedentary.  Weight is up 3 lbs.    Past Medical History:  Diagnosis Date  . Anemia, iron deficiency, inadequate dietary intake   . 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    by [place]  . CAD (coronary artery disease) 2003   cath - Min Dz, EF 65%, Adm.R/O'd  . Cervical spondylosis without myelopathy   . Chest pain, atypical   . Choroidal nevus 01/22/2011   left, yearly eye exam, no diabetic retinopathy  . Diabetes mellitus type II   . Dyspepsia   . Ex-smoker   . Fatty liver 02/2016   by [place]  . GERD (gastroesophageal reflux disease)   . Headache(784.0)   . Heart murmur    Hx of  . History of MRI of brain and brain stem 09/06  . History of MRSA infection 12/2013   R knee boil  . HLD (hyperlipidemia)   . Hyperuricemia   . Obesity   . Sleep apnea   . Stress-induced cardiomyopathy 09/27/98   WNL, EF 64%  . Urosepsis 01/01-01/05/10   Hospitalization    Past Surgical History:  Procedure Laterality Date  . CARDIAC CATHETERIZATION    . COLONOSCOPY  07/2013   1 benign polyp rpt 10 yrs [name])  . CORONARY THROMBECTOMY N/A 11/12/2019   Procedure: Coronary Thrombectomy;  Surgeon: Alan Crome, MD;  Location: [place] [facility];  Service: Cardiovascular;  Laterality: N/A;  . CORONARY/GRAFT ACUTE MI REVASCULARIZATION N/A 11/12/2019   Procedure: Coronary/Graft Acute MI Revascularization;  Surgeon: Alan Crome, MD;  Location: [place] [facility];  Service: Cardiovascular;  Laterality: N/A;  . KNEE ARTHROSCOPY Left 1988  . KNEE SURGERY Right 05/21/03   Right,  Dr. Marlou Morrison, med meniscus tear via MRI  . LEFT HEART CATH AND CORONARY ANGIOGRAPHY N/A 04/01/2017   Procedure: LEFT HEART CATH AND CORONARY ANGIOGRAPHY;  Surgeon: Morrison, Alan Kaman M, MD;  Location: [place] [facility];  Service: Cardiovascular;  Laterality: N/A;  . LEFT HEART CATH AND CORONARY ANGIOGRAPHY N/A 11/12/2019   Procedure: LEFT HEART CATH AND CORONARY ANGIOGRAPHY;  Surgeon: Alan Crome, MD;  Location: [place] [facility];  Service: Cardiovascular;  Laterality: N/A;  . MYELOGRAM  08/05   bulging disc    Current Medications: Current Meds  Medication Sig  . acetaminophen (TYLENOL) 500 MG tablet Take  1,000 mg by mouth 2 (two) times daily as needed for headache (pain).  [name] aspirin EC 81 MG tablet Take 81 mg by mouth daily with supper. Swallow whole.  . Calcium Carbonate Antacid (TUMS PO) Take 1 tablet by mouth daily as needed (acid reflux/indigestion).  . Cholecalciferol (VITAMIN D) 2000 units tablet Take 2,000 Units by mouth daily with breakfast.   . clopidogrel (PLAVIX) 75 MG tablet Take 1 tablet (75 mg total) by mouth daily with breakfast.  . finasteride (PROSCAR) 5 MG tablet Take 1 tablet (5 mg total) by mouth daily.  . Insulin Glargine (BASAGLAR KWIKPEN) 100 UNIT/ML Inject 75 Units into the skin at bedtime.  . Insulin Pen Needle (B-D ULTRAFINE III SHORT PEN) 31G X 8 MM MISC 1 Device by Other route 4 (four) times daily. USE AS DIRECTED DAILY  . losartan (COZAAR) 25 MG tablet Take 1 tablet (25 mg total) by mouth daily.  . metFORMIN (GLUCOPHAGE) 1000 MG tablet Take 1 tablet (1,000 mg total) by mouth daily with breakfast.  . metoprolol tartrate (LOPRESSOR) 25 MG tablet TAKE 0.5 TABLETS (12.5 MG TOTAL) BY MOUTH 2 (TWO) TIMES DAILY.  . Multiple Vitamin (MULTIVITAMIN WITH MINERALS) TABS tablet Take 1 tablet by mouth daily with breakfast.   . nitroGLYCERIN (NITROSTAT) 0.4 MG SL tablet Place 1 tablet (0.4 mg total) under the tongue every 5 (five) minutes as needed for chest pain.  Glory Rosebush ULTRA test strip USE TO CHECK SUGARS DAILY  AS DIRECTED  . probenecid (BENEMID) 500 MG tablet Take 0.5 tablets (250 mg total) by mouth every other day.  . rosuvastatin (CRESTOR) 40 MG tablet Take 1 tablet (40 mg total) by mouth daily with supper.  . tamsulosin (FLOMAX) 0.4 MG CAPS capsule Take 1 capsule (0.4 mg total) by mouth daily.  . vitamin C (ASCORBIC ACID) 500 MG tablet Take 500 mg by mouth daily with supper.      Allergies:   Victoza [liraglutide]   Social History   Socioeconomic History  . Marital status: Married    Spouse name: Not on file  . Number of children: 2  . Years of education: 13  .  Highest education level: High school graduate  Occupational History  . Occupation: Maintenance    Employer: [employer]  Tobacco Use  . Smoking status: Former Smoker    Quit date: 04/20/1974    Years since quitting: 46.4  . Smokeless tobacco: Never Used  . Tobacco comment: quit over 20 years  Vaping Use  . Vaping Use: Never used  Substance and Sexual Activity  . Alcohol use: Yes    Alcohol/week: 3.0 standard drinks    Types: 3 Cans of beer per week    Comment: occasional  . Drug use: No  . Sexual activity: Not on file  Other Topics Concern  . Not on file  Social  History Narrative   Lives with wife   Occupation: equipment maintenance   Activity: no regular exercise - hunts   Diet: good water, fruits/vegetables   Social Determinants of Health   Financial Resource Strain: Low Risk   . Difficulty of Paying Living Expenses: Not hard at all  Food Insecurity: No Food Insecurity  . Worried About Charity fundraiser in the Last Year: Never true  . Ran Out of Food in the Last Year: Never true  Transportation Needs: No Transportation Needs  . Lack of Transportation (Medical): No  . Lack of Transportation (Non-Medical): No  Physical Activity: Inactive  . Days of Exercise per Week: 0 days  . Minutes of Exercise per Session: 0 min  Stress: No Stress Concern Present  . Feeling of Stress : Not at all  Social Connections: Not on file     Family History: The patient's family history includes CAD (age of onset: 25) in his brother; Cancer in his maternal uncle; Diabetes in his mother; Heart disease in his father and mother; Hypertension in his father; Migraines in his brother. There is no history of Stroke or Colon cancer.  ROS:   Please see the history of present illness.     All other systems reviewed and are negative.  EKGs/Labs/Other Studies Reviewed:    The following studies were reviewed today:  Cath 11/12/2019  A stent was successfully placed.    Acute inferior  ST elevation myocardial infarction with ongoing pain for greater than 9 hours.  Totally occluded proximal to mid RCA treated with 22 x 3.0 Onyx DES postdilated to 3.5 mm with TIMI grade III flow and resolution of symptoms.  The PDA contains segmental mid vessel 50 percent stenosis.  Widely patent, short left main  Widely patent LAD with proximal to mid eccentric 50 percent stenosis and luminal irregularities beyond.  Widely patent circumflex with luminal irregularities.  LV reveals inferior wall hypokinesis.  EF 55 percent.  LVEDP 16 mmHg.  RECOMMENDATIONS:   Aggressive risk factor modification.  If A1c significantly elevated, consider adding SGLT2 therapy.  Lipid panel and if LDL greater than 70, intensify management by adding ezetimibe or PCSK9.  Phase 2 cardiac rehab.   EKG:  EKG is not ordered today.    Recent Labs: 11/12/2019: B Natriuretic Peptide 311.4 11/20/2019: Hemoglobin 15.0; Platelets 222 06/04/2020: ALT 29; BUN 27; Creatinine, Ser 1.18; Potassium 4.4; Sodium 142  Recent Lipid Panel    Component Value Date/Time   CHOL 113 06/04/2020 0734   CHOL 112 11/01/2018 0000   TRIG 86.0 06/04/2020 0734   HDL 33.10 (L) 06/04/2020 0734   HDL 37 (L) 11/01/2018 0000   CHOLHDL 3 06/04/2020 0734   VLDL 17.2 06/04/2020 0734   LDLCALC 62 06/04/2020 0734   LDLCALC 59 11/01/2018 0000    Physical Exam:    VS:  BP 110/70   Pulse 88   Ht 5' 7.5" (1.715 m)   Wt 198 lb 3.2 oz (89.9 kg)   BMI 30.58 kg/m     Wt Readings from Last 3 Encounters:  09/09/20 198 lb 3.2 oz (89.9 kg)  06/10/20 195 lb 3 oz (88.5 kg)  03/07/20 193 lb 3.2 oz (87.6 kg)     GEN:  Well nourished, obese, in no acute distress HEENT: Normal NECK: No JVD; No carotid bruits LYMPHATICS: No lymphadenopathy CARDIAC: RRR, no murmurs, rubs, gallops RESPIRATORY:  Clear to auscultation without rales, wheezing or rhonchi  ABDOMEN: Soft, non-tender, non-distended MUSCULOSKELETAL:  No edema;  No deformity  SKIN:  Warm and dry NEUROLOGIC:  Alert and oriented x 3 PSYCHIATRIC:  Normal affect   ASSESSMENT:    1. Coronary artery disease involving native coronary artery of native heart without angina pectoris   2. Essential hypertension, benign   3. Hyperlipidemia LDL goal <70   4. Controlled type 2 diabetes mellitus without complication, with long-term current use of insulin (HCC)    PLAN:    In order of problems listed above:  1. CAD: s/p  cardiac catheterization for inferior STEMI and underwent DES to RCA in July 2021.  Continue aspirin, Plavix for one year. On beta blocker and statin. May stop Plavix after July 25. Stressed importance of lifestyle modification with daily aerobic activity of 30-45 minutes.   2. Hypertension: Blood pressure well controlled.   3. Hyperlipidemia: Continue statin therapy. Last LDL at goal  4. DM2: Managed by his endocrinologist. Last A1vc 7.4%.   5. History of DVT: He only had one episode of DVT and has completed over 1 year of Xarelto.  Xarelto was discontinued by Dr. Jana Morrison. He did recommend repeating a D dimer- this can be done when he has his next lab draw.   Follow up in 6 months    Medication Adjustments/Labs and Tests Ordered: Current medicines are reviewed at length with the patient today.  Concerns regarding medicines are outlined above.  No orders of the defined types were placed in this encounter.  No orders of the defined types were placed in this encounter.   Patient Instructions  You may stop taking Plavix after July 25  Increase your aerobic activity to 30-45 minutes a day  Follow up in 6 months.     Signed, Delois Silvester Martinique, MD  09/09/2020 2:29 PM    [facility]

## 2020-09-09 ENCOUNTER — Other Ambulatory Visit: Payer: Self-pay

## 2020-09-09 ENCOUNTER — Encounter: Payer: Self-pay | Admitting: Cardiology

## 2020-09-09 ENCOUNTER — Ambulatory Visit: Payer: Medicare HMO | Admitting: Cardiology

## 2020-09-09 VITALS — BP 110/70 | HR 88 | Ht 67.5 in | Wt 198.2 lb

## 2020-09-09 DIAGNOSIS — I2510 Atherosclerotic heart disease of native coronary artery without angina pectoris: Secondary | ICD-10-CM | POA: Diagnosis not present

## 2020-09-09 DIAGNOSIS — Z794 Long term (current) use of insulin: Secondary | ICD-10-CM | POA: Diagnosis not present

## 2020-09-09 DIAGNOSIS — E119 Type 2 diabetes mellitus without complications: Secondary | ICD-10-CM | POA: Diagnosis not present

## 2020-09-09 DIAGNOSIS — E785 Hyperlipidemia, unspecified: Secondary | ICD-10-CM | POA: Diagnosis not present

## 2020-09-09 DIAGNOSIS — I10 Essential (primary) hypertension: Secondary | ICD-10-CM | POA: Diagnosis not present

## 2020-09-09 MED ORDER — ROSUVASTATIN CALCIUM 40 MG PO TABS: 40.0000 mg | ORAL_TABLET | Freq: Every day | ORAL | 3 refills | Status: DC

## 2020-09-09 MED ORDER — METOPROLOL TARTRATE 25 MG PO TABS: 12.5000 mg | ORAL_TABLET | Freq: Two times a day (BID) | ORAL | 3 refills | Status: DC

## 2020-09-09 MED ORDER — LOSARTAN POTASSIUM 25 MG PO TABS: 25.0000 mg | ORAL_TABLET | Freq: Every day | ORAL | 3 refills | Status: DC

## 2020-09-09 NOTE — Patient Instructions (Signed)
You may stop taking Plavix after July 25  Increase your aerobic activity to 30-45 minutes a day  Follow up in 6 months.

## 2020-11-14 DIAGNOSIS — E1165 Type 2 diabetes mellitus with hyperglycemia: Secondary | ICD-10-CM | POA: Diagnosis not present

## 2020-11-14 DIAGNOSIS — I10 Essential (primary) hypertension: Secondary | ICD-10-CM | POA: Diagnosis not present

## 2020-11-14 DIAGNOSIS — E782 Mixed hyperlipidemia: Secondary | ICD-10-CM | POA: Diagnosis not present

## 2020-11-14 DIAGNOSIS — Z794 Long term (current) use of insulin: Secondary | ICD-10-CM | POA: Diagnosis not present

## 2020-11-26 ENCOUNTER — Ambulatory Visit (INDEPENDENT_AMBULATORY_CARE_PROVIDER_SITE_OTHER): Payer: Medicare HMO | Admitting: Family Medicine

## 2020-11-26 ENCOUNTER — Other Ambulatory Visit: Payer: Self-pay

## 2020-11-26 VITALS — BP 130/60 | HR 68 | Temp 97.5°F | Ht 67.5 in | Wt 200.2 lb

## 2020-11-26 DIAGNOSIS — E1142 Type 2 diabetes mellitus with diabetic polyneuropathy: Secondary | ICD-10-CM

## 2020-11-26 DIAGNOSIS — L03116 Cellulitis of left lower limb: Secondary | ICD-10-CM | POA: Diagnosis not present

## 2020-11-26 MED ORDER — CEPHALEXIN 500 MG PO CAPS: 500.0000 mg | ORAL_CAPSULE | Freq: Two times a day (BID) | ORAL | 0 refills | Status: AC

## 2020-11-26 NOTE — Patient Instructions (Signed)
#   cellulitis - take keflex - call if not improving and can switch to Doxycycline - return next week if it has not resolve  Call if fevers, chills, body aches

## 2020-11-26 NOTE — Progress Notes (Signed)
Subjective:     Alan Morrison is a 70 y.o. male presenting for Mass (L calf, red, sore and warm to touch x 1.5 week/If Rx is sent in please [facility] [place]. )     HPI  #Red lesion - on the left calf - started having a sore leg - progressively getting worse - symptoms started 1.5 weeks - initially pain only with bumping the leg - has had this happen in the past - in different areas which resolved with abx - no fevers or chills - redness comes and goes with rubbing   Review of Systems   Social History   Tobacco Use  Smoking Status Former   Types: Cigarettes   Quit date: 04/20/1974   Years since quitting: 46.6  Smokeless Tobacco Never  Tobacco Comments   quit over 20 years        Objective:    BP Readings from Last 3 Encounters:  11/26/20 130/60  09/09/20 110/70  06/10/20 118/62   Wt Readings from Last 3 Encounters:  11/26/20 200 lb 4 oz (90.8 kg)  09/09/20 198 lb 3.2 oz (89.9 kg)  06/10/20 195 lb 3 oz (88.5 kg)    BP 130/60   Pulse 68   Temp (!) 97.5 F (36.4 C) (Temporal)   Ht 5' 7.5" (1.715 m)   Wt 200 lb 4 oz (90.8 kg)   SpO2 96%   BMI 30.90 kg/m    Physical Exam Constitutional:      Appearance: Normal appearance. He is not ill-appearing or diaphoretic.  HENT:     Right Ear: External ear normal.     Left Ear: External ear normal.     Nose: Nose normal.  Eyes:     General: No scleral icterus.    Extraocular Movements: Extraocular movements intact.     Conjunctiva/sclera: Conjunctivae normal.  Cardiovascular:     Rate and Rhythm: Normal rate.  Pulmonary:     Effort: Pulmonary effort is normal.  Musculoskeletal:     Cervical back: Neck supple.  Skin:    General: Skin is warm and dry.     Comments: Left posterior calf: triangle shaped area of erythema with central mild induration without fluctuance. Warm to the touch and tender.   Neurological:     Mental Status: He is alert. Mental status is at baseline.  Psychiatric:         Mood and Affect: Mood normal.          Assessment & Plan:   Problem List Items Addressed This Visit       Endocrine   Type 2 diabetes, controlled, with peripheral neuropathy ([place])    Lab Results  Component Value Date   HGBA1C 10.0 (H) 06/04/2020  Poorly controlled. Discussed increase risk for infection. Follows with endocrinology.        Other   Cellulitis of left lower extremity - Primary    Mild cellulitis. Discussed warning signs for worsening. Will start with keflex and switch to doxy if no improvement.        Relevant Medications   cephALEXin (KEFLEX) 500 MG capsule     Return if symptoms worsen or fail to improve.  Lesleigh Noe, MD  This visit occurred during the SARS-CoV-2 public health emergency.  Safety protocols were in place, including screening questions prior to the visit, additional usage of staff PPE, and extensive cleaning of exam room while observing appropriate contact time as indicated for disinfecting solutions.

## 2020-11-26 NOTE — Assessment & Plan Note (Signed)
Lab Results  Component Value Date   HGBA1C 10.0 (H) 06/04/2020   Poorly controlled. Discussed increase risk for infection. Follows with endocrinology.

## 2020-11-26 NOTE — Assessment & Plan Note (Signed)
Mild cellulitis. Discussed warning signs for worsening. Will start with keflex and switch to doxy if no improvement.

## 2020-11-28 ENCOUNTER — Encounter: Payer: Self-pay | Admitting: Family Medicine

## 2020-11-28 DIAGNOSIS — L03116 Cellulitis of left lower limb: Secondary | ICD-10-CM

## 2020-11-28 MED ORDER — DOXYCYCLINE HYCLATE 100 MG PO TABS: 100.0000 mg | ORAL_TABLET | Freq: Two times a day (BID) | ORAL | 0 refills | Status: AC

## 2020-12-09 ENCOUNTER — Encounter: Payer: Self-pay | Admitting: Family Medicine

## 2020-12-09 ENCOUNTER — Other Ambulatory Visit: Payer: Self-pay

## 2020-12-09 ENCOUNTER — Ambulatory Visit (INDEPENDENT_AMBULATORY_CARE_PROVIDER_SITE_OTHER): Payer: Medicare HMO | Admitting: Family Medicine

## 2020-12-09 VITALS — BP 120/70 | HR 84 | Temp 97.5°F | Ht 67.5 in | Wt 203.0 lb

## 2020-12-09 DIAGNOSIS — I10 Essential (primary) hypertension: Secondary | ICD-10-CM | POA: Diagnosis not present

## 2020-12-09 DIAGNOSIS — I2510 Atherosclerotic heart disease of native coronary artery without angina pectoris: Secondary | ICD-10-CM

## 2020-12-09 DIAGNOSIS — L03116 Cellulitis of left lower limb: Secondary | ICD-10-CM

## 2020-12-09 DIAGNOSIS — E1142 Type 2 diabetes mellitus with diabetic polyneuropathy: Secondary | ICD-10-CM

## 2020-12-09 LAB — POCT GLYCOSYLATED HEMOGLOBIN (HGB A1C): Hemoglobin A1C: 8.1 % — AB (ref 4.0–5.6)

## 2020-12-09 NOTE — Assessment & Plan Note (Signed)
This has fully resolved after doxy course.

## 2020-12-09 NOTE — Assessment & Plan Note (Signed)
Appreciate cardiology care.  Now only on aspirin '81mg'$  daily.

## 2020-12-09 NOTE — Patient Instructions (Addendum)
Good to see you today. Continue working towards low sugar low carb diabetic diet. Good to see you today Return as needed or in 6 months for physical.

## 2020-12-09 NOTE — Progress Notes (Signed)
Patient ID: Alan Morrison, male    DOB: Oct 14, 1950, 70 y.o.   MRN: [id number]  This visit was conducted in person.  BP 120/70 (BP Location: Right Arm, Cuff Size: Large)   Pulse 84   Temp (!) 97.5 F (36.4 C) (Temporal)   Ht 5' 7.5" (1.715 m)   Wt 203 lb (92.1 kg)   SpO2 96%   BMI 31.33 kg/m    CC: DM f/u visit  Subjective:   HPI: Alan Morrison is a 70 y.o. male presenting on 12/09/2020 for Diabetes (6 mo f/u)   Recent evaluation by Dr Alan Morrison for LLE cellulitis treated with keflex and doxycycline course. This has improved. Tolerated meds well, no diarrhea.   Known CAD s/p STEMI/DES to RCA 10/2019, cardiomyopathy with EF 45-50%. Sees cardiology Dr Alan Morrison regularly. Was on aspirin/plafix for 1 year, then transitioned to aspirin alone at end of July 2022. H/o DVT completed xarelto x 1 year, saw heme (Alan Morrison). Rec rpt D dimer at next labwork.  HTN - low BP noted today - he does check at home, BP runs 100-125/80. Continues losartan '25mg'$  daily and metoprolol 12.'5mg'$  bid. No dizziness, lightheadedness. Staying well hydrated with plenty of fluid.   DM - followed by [facility] endocrinology [facility]. Does regularly check sugars twice daily: averaging 130-140 fasting, evening 230, recently higher due to dietary liberties (family visiting in town). Compliant with antihyperglycemic regimen which includes: basaglar 75u in the mornings, metformin '1000mg'$  daily in am. One low sugar to 59 managed with glucose tablet. Denies paresthesias, blurry vision. Last diabetic eye exam 10/2019, rpt scheduled next month. Glucometer brand: one touch. Last foot exam: today. DSME: 2018.  Lab Results  Component Value Date   HGBA1C 8.1 (A) 12/09/2020   Diabetic Foot Exam - Simple   Simple Foot Form Diabetic Foot exam was performed with the following findings: Yes 12/09/2020  9:19 AM  Visual Inspection No deformities, no ulcerations, no other skin breakdown bilaterally: Yes Sensation Testing Intact to touch  and monofilament testing bilaterally: Yes Pulse Check Posterior Tibialis and Dorsalis pulse intact bilaterally: Yes Comments 2+ DP bilaterally    Lab Results  Component Value Date   MICROALBUR 15.9 (H) 10/11/2017         Relevant past medical, surgical, family and social history reviewed and updated as indicated. Interim medical history since our last visit reviewed. Allergies and medications reviewed and updated. Outpatient Medications Prior to Visit  Medication Sig Dispense Refill   acetaminophen (TYLENOL) 500 MG tablet Take 1,000 mg by mouth 2 (two) times daily as needed for headache (pain).     aspirin EC 81 MG tablet Take 81 mg by mouth daily with supper. Swallow whole.     Calcium Carbonate Antacid (TUMS PO) Take 1 tablet by mouth daily as needed (acid reflux/indigestion).     Cholecalciferol (VITAMIN D) 2000 units tablet Take 2,000 Units by mouth daily with breakfast.      finasteride (PROSCAR) 5 MG tablet Take 1 tablet (5 mg total) by mouth daily. 90 tablet 3   Insulin Glargine (BASAGLAR KWIKPEN) 100 UNIT/ML Inject 75 Units into the skin at bedtime.     Insulin Pen Needle (B-D ULTRAFINE III SHORT PEN) 31G X 8 MM MISC 1 Device by Other route 4 (four) times daily. USE AS DIRECTED DAILY 120 each 11   losartan (COZAAR) 25 MG tablet Take 1 tablet (25 mg total) by mouth daily. 90 tablet 3   metFORMIN (GLUCOPHAGE) 1000 MG tablet Take 1 tablet (  1,000 mg total) by mouth daily with breakfast. 90 tablet 3   Multiple Vitamin (MULTIVITAMIN WITH MINERALS) TABS tablet Take 1 tablet by mouth daily with breakfast.      ONETOUCH ULTRA test strip USE TO CHECK SUGARS DAILY  AS DIRECTED 100 strip 3   probenecid (BENEMID) 500 MG tablet Take 0.5 tablets (250 mg total) by mouth every other day. 45 tablet 3   rosuvastatin (CRESTOR) 40 MG tablet Take 1 tablet (40 mg total) by mouth daily with supper. 90 tablet 3   tamsulosin (FLOMAX) 0.4 MG CAPS capsule Take 1 capsule (0.4 mg total) by mouth daily. 90  capsule 3   vitamin C (ASCORBIC ACID) 500 MG tablet Take 500 mg by mouth daily with supper.      metoprolol tartrate (LOPRESSOR) 25 MG tablet Take 0.5 tablets (12.5 mg total) by mouth 2 (two) times daily. 90 tablet 3   nitroGLYCERIN (NITROSTAT) 0.4 MG SL tablet Place 1 tablet (0.4 mg total) under the tongue every 5 (five) minutes as needed for chest pain. 25 tablet 11   No facility-administered medications prior to visit.     Per HPI unless specifically indicated in ROS section below Review of Systems  Objective:  BP 120/70 (BP Location: Right Arm, Cuff Size: Large)   Pulse 84   Temp (!) 97.5 F (36.4 C) (Temporal)   Ht 5' 7.5" (1.715 m)   Wt 203 lb (92.1 kg)   SpO2 96%   BMI 31.33 kg/m   Wt Readings from Last 3 Encounters:  12/09/20 203 lb (92.1 kg)  11/26/20 200 lb 4 oz (90.8 kg)  09/09/20 198 lb 3.2 oz (89.9 kg)      Physical Exam Vitals and nursing note reviewed.  Constitutional:      Appearance: Normal appearance. He is not ill-appearing.  Eyes:     Extraocular Movements: Extraocular movements intact.     Conjunctiva/sclera: Conjunctivae normal.     Pupils: Pupils are equal, round, and reactive to light.  Cardiovascular:     Rate and Rhythm: Normal rate and regular rhythm.     Pulses: Normal pulses.     Heart sounds: Normal heart sounds. No murmur heard. Pulmonary:     Effort: Pulmonary effort is normal. No respiratory distress.     Breath sounds: Normal breath sounds. No wheezing, rhonchi or rales.  Musculoskeletal:     Right lower leg: No edema.     Left lower leg: No edema.     Comments: See HPI for foot exam if done  Skin:    General: Skin is warm and dry.     Findings: No rash.  Neurological:     Mental Status: He is alert.  Psychiatric:        Mood and Affect: Mood normal.        Behavior: Behavior normal.      Results for orders placed or performed in visit on 12/09/20  POCT glycosylated hemoglobin (Hb A1C)  Result Value Ref Range   Hemoglobin  A1C 8.1 (A) 4.0 - 5.6 %   HbA1c POC (<> result, manual entry)     HbA1c, POC (prediabetic range)     HbA1c, POC (controlled diabetic range)      Assessment & Plan:  This visit occurred during the SARS-CoV-2 public health emergency.  Safety protocols were in place, including screening questions prior to the visit, additional usage of staff PPE, and extensive cleaning of exam room while observing appropriate contact time as indicated for disinfecting  solutions.   Problem List Items Addressed This Visit     Type 2 diabetes, controlled, with peripheral neuropathy ([place]) - Primary    Chronic, deteriorated. Followed by endo. Continue current regimen, no changes made.       Relevant Orders   POCT glycosylated hemoglobin (Hb A1C) (Completed)   CAD (coronary artery disease), native coronary artery    Appreciate cardiology care.  Now only on aspirin '81mg'$  daily.       Essential hypertension, benign    Chronic, stable. Continue current regimen.       RESOLVED: Cellulitis of left lower extremity    This has fully resolved after doxy course.         No orders of the defined types were placed in this encounter.  Orders Placed This Encounter  Procedures   POCT glycosylated hemoglobin (Hb A1C)     Patient Instructions  Good to see you today. Continue working towards low sugar low carb diabetic diet. Good to see you today Return as needed or in 6 months for physical.   Follow up plan: Return in about 6 months (around 06/11/2021) for annual exam, prior fasting for blood work, medicare wellness visit.  [name], MD

## 2020-12-09 NOTE — Assessment & Plan Note (Signed)
Chronic, stable. Continue current regimen. 

## 2020-12-09 NOTE — Assessment & Plan Note (Signed)
Chronic, deteriorated. Followed by endo. Continue current regimen, no changes made.

## 2021-01-08 DIAGNOSIS — H524 Presbyopia: Secondary | ICD-10-CM | POA: Diagnosis not present

## 2021-01-08 DIAGNOSIS — C6930 Malignant neoplasm of unspecified choroid: Secondary | ICD-10-CM | POA: Diagnosis not present

## 2021-01-08 DIAGNOSIS — H5203 Hypermetropia, bilateral: Secondary | ICD-10-CM | POA: Diagnosis not present

## 2021-01-08 DIAGNOSIS — E119 Type 2 diabetes mellitus without complications: Secondary | ICD-10-CM | POA: Diagnosis not present

## 2021-01-08 DIAGNOSIS — I10 Essential (primary) hypertension: Secondary | ICD-10-CM | POA: Diagnosis not present

## 2021-01-08 DIAGNOSIS — H35033 Hypertensive retinopathy, bilateral: Secondary | ICD-10-CM | POA: Diagnosis not present

## 2021-01-08 DIAGNOSIS — H52223 Regular astigmatism, bilateral: Secondary | ICD-10-CM | POA: Diagnosis not present

## 2021-01-08 DIAGNOSIS — H25813 Combined forms of age-related cataract, bilateral: Secondary | ICD-10-CM | POA: Diagnosis not present

## 2021-02-11 ENCOUNTER — Encounter: Payer: Self-pay | Admitting: Family Medicine

## 2021-02-11 MED ORDER — DOXYCYCLINE HYCLATE 100 MG PO TABS: 100.0000 mg | ORAL_TABLET | Freq: Two times a day (BID) | ORAL | 0 refills | Status: DC

## 2021-03-17 NOTE — Progress Notes (Signed)
Cardiology Clinic Note   Patient Name: Alan Morrison Date of Encounter: 03/20/2021  Primary Care Provider:  [name], MD Primary Cardiologist:  Peter Martinique, MD  Patient Profile    Alan Morrison 70 year old male presents the clinic today for follow-up evaluation of his coronary artery disease, cardiomyopathy, and essential hypertension.  Past Medical History    Past Medical History:  Diagnosis Date   Anemia, iron deficiency, inadequate dietary intake    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   by [place]   CAD (coronary artery disease) 2003   cath - Min Dz, EF 65%, Adm.R/O'd   Cervical spondylosis without myelopathy    Chest pain, atypical    Choroidal nevus 01/22/2011   left, yearly eye exam, no diabetic retinopathy   Diabetes mellitus type II    Dyspepsia    Ex-smoker    Fatty liver 02/2016   by [place]   GERD (gastroesophageal reflux disease)    Headache(784.0)    Heart murmur    Hx of   History of MRI of brain and brain stem 09/06   History of MRSA infection 12/2013   R knee boil   HLD (hyperlipidemia)    Hyperuricemia    Obesity    Sleep apnea    Stress-induced cardiomyopathy 09/27/98   WNL, EF 64%   Urosepsis 01/01-01/05/10   Hospitalization   Past Surgical History:  Procedure Laterality Date   CARDIAC CATHETERIZATION     COLONOSCOPY  07/2013   1 benign polyp rpt 10 yrs [name])   CORONARY THROMBECTOMY N/A 11/12/2019   Procedure: Coronary Thrombectomy;  Surgeon: Belva Crome, MD;  Location: [facility];  Service: Cardiovascular;  Laterality: N/A;   CORONARY/GRAFT ACUTE MI REVASCULARIZATION N/A 11/12/2019   Procedure: Coronary/Graft Acute MI Revascularization;  Surgeon: Belva Crome, MD;  Location: [facility];  Service: Cardiovascular;  Laterality: N/A;   KNEE ARTHROSCOPY Left 1988   KNEE SURGERY Right 05/21/03   Right, Dr. Marlou Sa, med meniscus tear via MRI   LEFT HEART CATH AND CORONARY ANGIOGRAPHY N/A 04/01/2017   Procedure: LEFT HEART  CATH AND CORONARY ANGIOGRAPHY;  Surgeon: Martinique, Peter M, MD;  Location: [facility];  Service: Cardiovascular;  Laterality: N/A;   LEFT HEART CATH AND CORONARY ANGIOGRAPHY N/A 11/12/2019   Procedure: LEFT HEART CATH AND CORONARY ANGIOGRAPHY;  Surgeon: Belva Crome, MD;  Location: [facility];  Service: Cardiovascular;  Laterality: N/A;   MYELOGRAM  08/05   bulging disc    Allergies  No Active Allergies   History of Present Illness    Alan Morrison is a PMH of diabetes mellitus type 2, stress-induced cardiomyopathy, hyperlipidemia, OSA on CPAP, DVT on Xarelto, is a former smoker, and coronary artery disease.  He underwent angiography in 2018 which showed nonobstructive CAD.  He had an inferior wall acute STEMI 11/12/2019.  His initial EKG identified inferior Q waves with persistent ST elevation in his inferior leads.  His cardiac catheterization 11/12/2019 showed total occlusion of his proximal-mid RCA which was treated with PCI and DES x1.  He was also noted to have 30% mid PDA, widely patent left main, 50% proximal-mid LAD and an EF of 55% with LVEDP of 16 mmHg.  He was placed on Plavix aspirin and Xarelto.  At 1 month his aspirin was discontinued.  His echocardiogram showed an EF of 45-50%.  During his hospitalization he had frequent hypoglycemic spells.  He reported not being compliant with his insulin at home.  His Lovenox was stopped and he was discharged home on long-acting insulin with a plan to follow-up with endocrinology.  He was seen by Dr. Jana Hakim 8/21 who discontinued his Xarelto.  He recommended repeating D-dimer.  He was seen in follow-up by Dr. Martinique on 09/09/2020.  During that time he was doing well.  He denied chest pain shortness of breath.  He remains sedentary.  He had gained about 3 pounds.  He was instructed that he could stop his Plavix after 11/11/20.  He was encouraged to increase his physical activity with 30-45 minutes of aerobic exercise per day.  He  presents to the clinic today for follow-up evaluation states he feels well.  He has been more active recently because he has been deer hunting.  Reports that routinely does not exercise.  We reviewed his angiography and his February lipid panel.  He has his lipids drawn during his annual physical.  We reviewed the importance of high-fiber diet and modifiable risk factors.  His blood pressures been well controlled at home.  I will continue his current medications, have him increase his physical activity with a goal of 150 minutes of moderate physical activity per week, and follow-up in 1 year.  Today he denies chest pain, shortness of breath, lower extremity edema, fatigue, palpitations, melena, hematuria, hemoptysis, diaphoresis, weakness, presyncope, syncope, orthopnea, and PND.   Home Medications    Prior to Admission medications   Medication Sig Start Date End Date Taking? Authorizing Provider  acetaminophen (TYLENOL) 500 MG tablet Take 1,000 mg by mouth 2 (two) times daily as needed for headache (pain).    [provider]  aspirin EC 81 MG tablet Take 81 mg by mouth daily with supper. Swallow whole.    [provider]  Calcium Carbonate Antacid (TUMS PO) Take 1 tablet by mouth daily as needed (acid reflux/indigestion).    [provider]  Cholecalciferol (VITAMIN D) 2000 units tablet Take 2,000 Units by mouth daily with breakfast.     [provider]  doxycycline (VIBRA-TABS) 100 MG tablet Take 1 tablet (100 mg total) by mouth 2 (two) times daily. 02/11/21   [name], MD  finasteride (PROSCAR) 5 MG tablet Take 1 tablet (5 mg total) by mouth daily. 06/10/20   [name], MD  Insulin Glargine [facility]) 100 UNIT/ML Inject 75 Units into the skin at bedtime. 06/10/20   [name], MD  Insulin Pen Needle (B-D ULTRAFINE III SHORT PEN) 31G X 8 MM MISC 1 Device by Other route 4 (four) times daily. USE AS DIRECTED DAILY 06/09/18   Renato Shin, MD  losartan (COZAAR) 25 MG tablet Take 1 tablet (25 mg total) by mouth daily. 09/09/20   Martinique, Peter M, MD  metFORMIN (GLUCOPHAGE) 1000 MG tablet Take 1 tablet (1,000 mg total) by mouth daily with breakfast. 06/10/20   [name], MD  metoprolol tartrate (LOPRESSOR) 25 MG tablet Take 0.5 tablets (12.5 mg total) by mouth 2 (two) times daily. 09/09/20 12/08/20  Martinique, Peter M, MD  Multiple Vitamin (MULTIVITAMIN WITH MINERALS) TABS tablet Take 1 tablet by mouth daily with breakfast.     [provider]  nitroGLYCERIN (NITROSTAT) 0.4 MG SL tablet Place 1 tablet (0.4 mg total) under the tongue every 5 (five) minutes as needed for chest pain. 01/23/20 04/22/20  Martinique, Peter M, MD  [facility] ULTRA test strip USE TO CHECK SUGARS DAILY  AS DIRECTED 03/13/19   [name], MD  probenecid (BENEMID) 500 MG tablet Take 0.5  tablets (250 mg total) by mouth every other day. 06/10/20   [name], MD  rosuvastatin (CRESTOR) 40 MG tablet Take 1 tablet (40 mg total) by mouth daily with supper. 09/09/20   Martinique, Peter M, MD  tamsulosin (FLOMAX) 0.4 MG CAPS capsule Take 1 capsule (0.4 mg total) by mouth daily. 06/10/20   [name], MD  vitamin C (ASCORBIC ACID) 500 MG tablet Take 500 mg by mouth daily with supper.     [provider]    Family History    Family History  Problem Relation Age of Onset   Heart disease Mother        CHF   Diabetes Mother    Heart disease Father        CHF   Hypertension Father    Migraines Brother        severe headaches from arsenic in the past from  wood that was treated on his deck   CAD Brother 81       stent   Cancer Maternal Uncle        unsure   Stroke Neg Hx    Colon cancer Neg Hx    He indicated that his mother is deceased. He indicated that his father is deceased. He indicated that both of his sisters are alive. He indicated that both of his brothers are alive. He indicated that his maternal grandmother is deceased. He  indicated that his maternal grandfather is deceased. He indicated that his paternal grandmother is deceased. He indicated that his paternal grandfather is deceased. He indicated that the status of his maternal uncle is unknown. He indicated that the status of his neg hx is unknown.  Social History    Social History   Socioeconomic History   Marital status: Married    Spouse name: Not on file   Number of children: 2   Years of education: 12   Highest education level: High school graduate  Occupational History   Occupation: Maintenance    Employer: [employer]  Tobacco Use   Smoking status: Former    Types: Cigarettes    Quit date: 04/20/1974    Years since quitting: 46.9   Smokeless tobacco: Never   Tobacco comments:    quit over 20 years  Vaping Use   Vaping Use: Never used  Substance and Sexual Activity   Alcohol use: Yes    Alcohol/week: 3.0 standard drinks    Types: 3 Cans of beer per week    Comment: occasional   Drug use: No   Sexual activity: Not on file  Other Topics Concern   Not on file  Social History Narrative   Lives with wife   Occupation: equipment maintenance   Activity: no regular exercise - hunts   Diet: good water, fruits/vegetables   Social Determinants of Health   Financial Resource Strain: Low Risk    Difficulty of Paying Living Expenses: Not hard at all  Food Insecurity: No Food Insecurity   Worried About Charity fundraiser in the Last Year: Never true   Arboriculturist in the Last Year: Never true  Transportation Needs: No Transportation Needs   Lack of Transportation (Medical): No   Lack of Transportation (Non-Medical): No  Physical Activity: Inactive   Days of Exercise per Week: 0 days   Minutes of Exercise per Session: 0 min  Stress: No Stress Concern Present   Feeling of Stress : Not at all  Social Connections: Not  on file  Intimate Partner Violence: Not At Risk   Fear of Current or Ex-Partner: No   Emotionally Abused: No    Physically Abused: No   Sexually Abused: No     Review of Systems    General:  No chills, fever, night sweats or weight changes.  Cardiovascular:  No chest pain, dyspnea on exertion, edema, orthopnea, palpitations, paroxysmal nocturnal dyspnea. Dermatological: No rash, lesions/masses Respiratory: No cough, dyspnea Urologic: No hematuria, dysuria Abdominal:   No nausea, vomiting, diarrhea, bright red blood per rectum, melena, or hematemesis Neurologic:  No visual changes, wkns, changes in mental status. All other systems reviewed and are otherwise negative except as noted above.  Physical Exam    VS:  BP 122/78   Pulse 75   Ht 5' 7.5" (1.715 m)   Wt 209 lb (94.8 kg)   SpO2 96%   BMI 32.25 kg/m  , BMI Body mass index is 32.25 kg/m. GEN: Well nourished, well developed, in no acute distress. HEENT: normal. Neck: Supple, no JVD, carotid bruits, or masses. Cardiac: RRR, no murmurs, rubs, or gallops. No clubbing, cyanosis, edema.  Radials/DP/PT 2+ and equal bilaterally.  Respiratory:  Respirations regular and unlabored, clear to auscultation bilaterally. GI: Soft, nontender, nondistended, BS + x 4. MS: no deformity or atrophy. Skin: warm and dry, no rash. Neuro:  Strength and sensation are intact. Psych: Normal affect.  Accessory Clinical Findings    Recent Labs: 06/04/2020: ALT 29; BUN 27; Creatinine, Ser 1.18; Potassium 4.4; Sodium 142   Recent Lipid Panel    Component Value Date/Time   CHOL 113 06/04/2020 0734   CHOL 112 11/01/2018 0000   TRIG 86.0 06/04/2020 0734   HDL 33.10 (L) 06/04/2020 0734   HDL 37 (L) 11/01/2018 0000   CHOLHDL 3 06/04/2020 0734   VLDL 17.2 06/04/2020 0734   LDLCALC 62 06/04/2020 0734   LDLCALC 59 11/01/2018 0000    ECG personally reviewed by me today-normal sinus rhythm inferior infarct undetermined age 64 bpm- No acute changes  Echocardiogram 11/13/2019 IMPRESSIONS     1. Left ventricular ejection fraction, by estimation, is 45 to 50%.  The  left ventricle has mildly decreased function. The left ventricle  demonstrates regional wall motion abnormalities (see scoring  diagram/findings for description). There is mild  concentric left ventricular hypertrophy. Left ventricular diastolic  function could not be evaluated.   2. Right ventricular systolic function is mildly reduced. The right  ventricular size is mildly enlarged. There is normal pulmonary artery  systolic pressure. The estimated right ventricular systolic pressure is  85.4 mmHg.   3. The mitral valve is grossly normal. Mild mitral valve regurgitation.  No evidence of mitral stenosis.   4. The aortic valve is tricuspid. Aortic valve regurgitation is not  visualized. No aortic stenosis is present.   5. The inferior vena cava is dilated in size with <50% respiratory  variability, suggesting right atrial pressure of 15 mmHg.  Cardiac catheterization 11/12/2019 A stent was successfully placed.   Acute inferior ST elevation myocardial infarction with ongoing pain for greater than 9 hours. Totally occluded proximal to mid RCA treated with 22 x 3.0 Onyx DES postdilated to 3.5 mm with TIMI grade III flow and resolution of symptoms.  The PDA contains segmental mid vessel 50 percent stenosis. Widely patent, short left main Widely patent LAD with proximal to mid eccentric 50 percent stenosis and luminal irregularities beyond. Widely patent circumflex with luminal irregularities. LV reveals inferior wall hypokinesis.  EF 55  percent.  LVEDP 16 mmHg.   RECOMMENDATIONS:   Aggressive risk factor modification. If A1c significantly elevated, consider adding SGLT2 therapy. Lipid panel and if LDL greater than 70, intensify management by adding ezetimibe or PCSK9. Phase 2 cardiac rehab. Diagnostic Dominance: Right Intervention    Assessment & Plan   1.  Coronary artery disease-status post cardiac catheterization in the setting of inferior STEMI.  He received PCI with DES  to his RCA 7/21. Continue aspirin, metoprolol, rosuvastatin, nitroglycerin as needed Heart healthy low-sodium diet-salty 6 given Increase physical activity as tolerated-goal 150 minutes of moderate physical activity per week.  Hyperlipidemia-06/04/2020: Cholesterol 113; HDL 33.10; LDL Cholesterol 62; Triglycerides 86.0; VLDL 17.2 Continue rosuvastatin, aspirin Heart healthy low-sodium high-fiber diet Increase physical activity as tolerated Repeat fasting lipids and LFTs 2/23-done with annual physical  Essential hypertension-BP today 122/78.  Well-controlled at home. Continue losartan, metoprolol Heart healthy low-sodium diet-salty 6 given  Type 2 diabetes-glucose 97 on 06/04/2020.   Continue metformin, insulin Follows with PCP/endocrinology  History of DVT-completed 1 year of Xarelto therapy.  Follow-up D-dimer was within normal limits.  Disposition: Follow-up with Dr. Martinique or me in 9-12 months.  Jossie Ng. Teoman Giraud NP-C    03/20/2021, 3:54 PM [place] [address] Office [phone] Fax [phone]  Notice: This dictation was prepared with Dragon dictation along with smaller phrase technology. Any transcriptional errors that result from this process are unintentional and may not be corrected upon review.  I spent 14 minutes examining this patient, reviewing medications, and using patient centered shared decision making involving her cardiac care.  Prior to her visit I spent greater than 20 minutes reviewing her past medical history,  medications, and prior cardiac tests.

## 2021-03-20 ENCOUNTER — Encounter: Payer: Self-pay | Admitting: General Practice

## 2021-03-20 ENCOUNTER — Ambulatory Visit: Payer: Medicare HMO | Admitting: General Practice

## 2021-03-20 ENCOUNTER — Other Ambulatory Visit: Payer: Self-pay

## 2021-03-20 VITALS — BP 122/78 | HR 75 | Ht 67.5 in | Wt 209.0 lb

## 2021-03-20 DIAGNOSIS — E785 Hyperlipidemia, unspecified: Secondary | ICD-10-CM | POA: Diagnosis not present

## 2021-03-20 DIAGNOSIS — Z0100 Encounter for examination of eyes and vision without abnormal findings: Secondary | ICD-10-CM | POA: Diagnosis not present

## 2021-03-20 DIAGNOSIS — E119 Type 2 diabetes mellitus without complications: Secondary | ICD-10-CM | POA: Diagnosis not present

## 2021-03-20 DIAGNOSIS — Z794 Long term (current) use of insulin: Secondary | ICD-10-CM | POA: Diagnosis not present

## 2021-03-20 DIAGNOSIS — I82409 Acute embolism and thrombosis of unspecified deep veins of unspecified lower extremity: Secondary | ICD-10-CM | POA: Diagnosis not present

## 2021-03-20 DIAGNOSIS — I10 Essential (primary) hypertension: Secondary | ICD-10-CM | POA: Diagnosis not present

## 2021-03-20 DIAGNOSIS — I2510 Atherosclerotic heart disease of native coronary artery without angina pectoris: Secondary | ICD-10-CM | POA: Diagnosis not present

## 2021-03-20 NOTE — Patient Instructions (Signed)
Medication Instructions:  The current medical regimen is effective;  continue present plan and medications as directed. Please refer to the Current Medication list given to you today.   *If you need a refill on your cardiac medications before your next appointment, please call your pharmacy*  Lab Work:   Testing/Procedures:  NONE    NONE  Special Instructions PLEASE READ AND FOLLOW SALTY 6-ATTACHED-1,800mg  daily  PLEASE INCREASE PHYSICAL ACTIVITY AS TOLERATED, GOA; 150 MINUTES OF MODERATE PHYSICAL ACTIVITY WEEKLY  Follow-Up: Your next appointment:  12 month(s) In Person with Peter Martinique, MD, Coletta Memos, FNP-C  or Fabian Sharp, PA-C, Sande Rives, PA-C, Caron Presume, PA-C, Jory Sims, DNP, ANP, Almyra Deforest, PA-C, or Diona Browner, NP      Please call our office 2 months in advance to schedule this appointment:1  At [facility], you and your health needs are our priority.  As part of our continuing mission to provide you with exceptional heart care, we have created designated Provider Care Teams.  These Care Teams include your primary Cardiologist (physician) and Advanced Practice Providers (APPs -  Physician Assistants and Nurse Practitioners) who all work together to provide you with the care you need, when you need it.            6 SALTY THINGS TO AVOID     1,800MG  DAILY

## 2021-04-01 DIAGNOSIS — E1165 Type 2 diabetes mellitus with hyperglycemia: Secondary | ICD-10-CM | POA: Diagnosis not present

## 2021-04-01 DIAGNOSIS — I10 Essential (primary) hypertension: Secondary | ICD-10-CM | POA: Diagnosis not present

## 2021-04-01 DIAGNOSIS — E782 Mixed hyperlipidemia: Secondary | ICD-10-CM | POA: Diagnosis not present

## 2021-04-01 DIAGNOSIS — Z794 Long term (current) use of insulin: Secondary | ICD-10-CM | POA: Diagnosis not present

## 2021-05-27 ENCOUNTER — Other Ambulatory Visit: Payer: Self-pay | Admitting: Cardiology

## 2021-05-29 ENCOUNTER — Other Ambulatory Visit: Payer: Self-pay | Admitting: Family Medicine

## 2021-05-29 DIAGNOSIS — E1169 Type 2 diabetes mellitus with other specified complication: Secondary | ICD-10-CM

## 2021-05-29 DIAGNOSIS — N138 Other obstructive and reflux uropathy: Secondary | ICD-10-CM

## 2021-05-29 DIAGNOSIS — E559 Vitamin D deficiency, unspecified: Secondary | ICD-10-CM

## 2021-05-29 DIAGNOSIS — D508 Other iron deficiency anemias: Secondary | ICD-10-CM

## 2021-05-29 DIAGNOSIS — E785 Hyperlipidemia, unspecified: Secondary | ICD-10-CM

## 2021-05-29 DIAGNOSIS — E1142 Type 2 diabetes mellitus with diabetic polyneuropathy: Secondary | ICD-10-CM

## 2021-05-29 DIAGNOSIS — E790 Hyperuricemia without signs of inflammatory arthritis and tophaceous disease: Secondary | ICD-10-CM

## 2021-06-03 ENCOUNTER — Other Ambulatory Visit: Payer: Self-pay | Admitting: Family Medicine

## 2021-06-04 NOTE — Progress Notes (Signed)
Subjective:   Alan Morrison is a 71 y.o. male who presents for Medicare Annual/Subsequent preventive examination.  I connected with [name] today by telephone and verified that I am speaking with the correct person using two identifiers. Location patient: home Location provider: work Persons participating in the virtual visit: patient, Marine scientist.    I discussed the limitations, risks, security and privacy concerns of performing an evaluation and management service by telephone and the availability of in person appointments. I also discussed with the patient that there may be a patient responsible charge related to this service. The patient expressed understanding and verbally consented to this telephonic visit.    Interactive audio and video telecommunications were attempted between this provider and patient, however failed, due to patient having technical difficulties OR patient did not have access to video capability.  We continued and completed visit with audio only.  Some vital signs may be absent or patient reported.   Time Spent with patient on telephone encounter: 20 minutes  Review of Systems     Cardiac Risk Factors include: advanced age (>2men, >86 women);diabetes mellitus;hypertension;dyslipidemia     Objective:    Today's Vitals   06/05/21 1025  Weight: 209 lb (94.8 kg)  Height: 5\' 7"  (1.702 m)   Body mass index is 32.73 kg/m.  Advanced Directives 06/05/2021 06/04/2020 11/12/2019 04/01/2017 02/11/2017  Does Patient Have a Medical Advance Directive? Yes Yes No Yes Yes  Type of Paramedic of [place];Living will [place];Living will - [place];Living will -  Does patient want to make changes to medical advance directive? Yes (MAU/Ambulatory/Procedural Areas - Information given) - - No - Patient declined -  Copy of [place] in Chart? - No - copy requested - No - copy requested -  Would  patient like information on creating a medical advance directive? - - No - Patient declined - -    Current Medications (verified) Outpatient Encounter Medications as of 06/05/2021  Medication Sig   acetaminophen (TYLENOL) 500 MG tablet Take 1,000 mg by mouth 2 (two) times daily as needed for headache (pain).   aspirin EC 81 MG tablet Take 81 mg by mouth daily with supper. Swallow whole.   Calcium Carbonate Antacid (TUMS PO) Take 1 tablet by mouth daily as needed (acid reflux/indigestion).   Cholecalciferol (VITAMIN D) 2000 units tablet Take 2,000 Units by mouth daily with breakfast.    finasteride (PROSCAR) 5 MG tablet TAKE 1 TABLET BY MOUTH EVERY DAY   Insulin Glargine (BASAGLAR KWIKPEN) 100 UNIT/ML Inject 75 Units into the skin at bedtime.   Insulin Pen Needle (B-D ULTRAFINE III SHORT PEN) 31G X 8 MM MISC 1 Device by Other route 4 (four) times daily. USE AS DIRECTED DAILY   losartan (COZAAR) 25 MG tablet Take 1 tablet (25 mg total) by mouth daily.   metFORMIN (GLUCOPHAGE) 1000 MG tablet TAKE 1 TABLET BY MOUTH EVERY DAY AT BREAKFAST   Multiple Vitamin (MULTIVITAMIN WITH MINERALS) TABS tablet Take 1 tablet by mouth daily with breakfast.    nitroGLYCERIN (NITROSTAT) 0.4 MG SL tablet PLACE 1 TABLET UNDER THE TONGUE EVERY 5 MINUTES AS NEEDED FOR CHEST PAIN   ONETOUCH ULTRA test strip USE TO CHECK SUGARS DAILY  AS DIRECTED   probenecid (BENEMID) 500 MG tablet Take 0.5 tablets (250 mg total) by mouth every other day.   rosuvastatin (CRESTOR) 40 MG tablet Take 1 tablet (40 mg total) by mouth daily with supper.  tamsulosin (FLOMAX) 0.4 MG CAPS capsule Take 1 capsule (0.4 mg total) by mouth daily.   vitamin C (ASCORBIC ACID) 500 MG tablet Take 500 mg by mouth daily with supper.    doxycycline (VIBRA-TABS) 100 MG tablet Take 1 tablet (100 mg total) by mouth 2 (two) times daily. (Patient not taking: Reported on 06/05/2021)   metoprolol tartrate (LOPRESSOR) 25 MG tablet Take 0.5 tablets (12.5 mg total) by  mouth 2 (two) times daily.   [DISCONTINUED] finasteride (PROSCAR) 5 MG tablet Take 1 tablet (5 mg total) by mouth daily.   [DISCONTINUED] metFORMIN (GLUCOPHAGE) 1000 MG tablet Take 1 tablet (1,000 mg total) by mouth daily with breakfast.   No facility-administered encounter medications on file as of 06/05/2021.    Allergies (verified) Patient has no active allergies.   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   by [place]   CAD (coronary artery disease) 2003   cath - Min Dz, EF 65%, Adm.R/O'd   Cervical spondylosis without myelopathy    Chest pain, atypical    Choroidal nevus 01/22/2011   left, yearly eye exam, no diabetic retinopathy   Diabetes mellitus type II    Dyspepsia    Ex-smoker    Fatty liver 02/2016   by [place]   GERD (gastroesophageal reflux disease)    Headache(784.0)    Heart murmur    Hx of   History of MRI of brain and brain stem 09/06   History of MRSA infection 12/2013   R knee boil   HLD (hyperlipidemia)    Hyperuricemia    Obesity    Sleep apnea    Stress-induced cardiomyopathy 09/27/98   WNL, EF 64%   Urosepsis 01/01-01/05/10   Hospitalization   Past Surgical History:  Procedure Laterality Date   CARDIAC CATHETERIZATION     COLONOSCOPY  07/2013   1 benign polyp rpt 10 yrs [name])   CORONARY THROMBECTOMY N/A 11/12/2019   Procedure: Coronary Thrombectomy;  Surgeon: Belva Crome, MD;  Location: [facility];  Service: Cardiovascular;  Laterality: N/A;   CORONARY/GRAFT ACUTE MI REVASCULARIZATION N/A 11/12/2019   Procedure: Coronary/Graft Acute MI Revascularization;  Surgeon: Belva Crome, MD;  Location: [facility];  Service: Cardiovascular;  Laterality: N/A;   KNEE ARTHROSCOPY Left 1988   KNEE SURGERY Right 05/21/03   Right, Dr. Marlou Sa, med meniscus tear via MRI   LEFT HEART CATH AND CORONARY ANGIOGRAPHY N/A 04/01/2017   Procedure: LEFT HEART  CATH AND CORONARY ANGIOGRAPHY;  Surgeon: Martinique, Peter M, MD;  Location: [facility];  Service: Cardiovascular;  Laterality: N/A;   LEFT HEART CATH AND CORONARY ANGIOGRAPHY N/A 11/12/2019   Procedure: LEFT HEART CATH AND CORONARY ANGIOGRAPHY;  Surgeon: Belva Crome, MD;  Location: [facility];  Service: Cardiovascular;  Laterality: N/A;   MYELOGRAM  08/05   bulging disc   Family History  Problem Relation Age of Onset   Heart disease Mother        CHF   Diabetes Mother    Heart disease Father        CHF   Hypertension Father    Migraines Brother        severe headaches from arsenic in the past from  wood that was treated on his deck   CAD Brother 54       stent   Cancer Maternal Uncle  unsure   Stroke Neg Hx    Colon cancer Neg Hx    Social History   Socioeconomic History   Marital status: Married    Spouse name: Not on file   Number of children: 2   Years of education: 12   Highest education level: High school graduate  Occupational History   Occupation: Maintenance    Employer: [employer]  Tobacco Use   Smoking status: Former    Types: Cigarettes    Quit date: 04/20/1974    Years since quitting: 47.1   Smokeless tobacco: Never   Tobacco comments:    quit over 20 years  Vaping Use   Vaping Use: Never used  Substance and Sexual Activity   Alcohol use: Yes    Alcohol/week: 3.0 standard drinks    Types: 3 Cans of beer per week    Comment: occasional   Drug use: No   Sexual activity: Not on file  Other Topics Concern   Not on file  Social History Narrative   Lives with wife   Occupation: equipment maintenance   Activity: no regular exercise - hunts   Diet: good water, fruits/vegetables   Social Determinants of Health   Financial Resource Strain: Low Risk    Difficulty of Paying Living Expenses: Not hard at all  Food Insecurity: No Food Insecurity   Worried About Charity fundraiser in the Last Year: Never true   Arboriculturist  in the Last Year: Never true  Transportation Needs: No Transportation Needs   Lack of Transportation (Medical): No   Lack of Transportation (Non-Medical): No  Physical Activity: Inactive   Days of Exercise per Week: 0 days   Minutes of Exercise per Session: 0 min  Stress: No Stress Concern Present   Feeling of Stress : Not at all  Social Connections: Moderately Isolated   Frequency of Communication with Friends and Family: More than three times a week   Frequency of Social Gatherings with Friends and Family: More than three times a week   Attends Religious Services: Never   Marine scientist or Organizations: No   Attends Music therapist: Never   Marital Status: Married    Tobacco Counseling Counseling given: Not Answered Tobacco comments: quit over 20 years   Clinical Intake:  Pre-visit preparation completed: Yes  Pain : No/denies pain     BMI - recorded: 32.73 Nutritional Status: BMI > 30  Obese Nutritional Risks: None Diabetes: Yes CBG done?: No Did pt. bring in CBG monitor from home?: No  How often do you need to have someone help you when you read instructions, pamphlets, or other written materials from your doctor or pharmacy?: 1 - Never Diabetes:  Is the patient diabetic?  Yes  If diabetic, was a CBG obtained today?  No  Did the patient bring in their glucometer from home?  No  How often do you monitor your CBG's? 1-2 times per day.   Financial Strains and Diabetes Management:  Are you having any financial strains with the device, your supplies or your medication? No .  Does the patient want to be seen by Chronic Care Management for management of their diabetes?  No  Would the patient like to be referred to a Nutritionist or for Diabetic Management?  No   Diabetic Exams:  Diabetic Eye Exam: Completed 03/2021. Patient plans on having updated information sent to PCP  Diabetic Foot Exam: Completed 12/09/20.  Interpreter Needed?:  No  Information entered by :: Orrin Brigham LPN   Activities of Daily Living In your present state of health, do you have any difficulty performing the following activities: 06/05/2021  Hearing? N  Vision? N  Difficulty concentrating or making decisions? N  Walking or climbing stairs? N  Dressing or bathing? N  Doing errands, shopping? N  Preparing Food and eating ? N  Using the Toilet? N  In the past six months, have you accidently leaked urine? N  Do you have problems with loss of bowel control? N  Managing your Medications? N  Managing your Finances? N  Housekeeping or managing your Housekeeping? N  Some recent data might be hidden    Patient Care Team: [name], MD as PCP - General (Family Medicine) Martinique, Peter M, MD as PCP - Cardiology (Cardiology) Martinique, Peter M, MD as Consulting Physician (Cardiology) Renato Shin, MD as Consulting Physician (Endocrinology) Magrinat, Virgie Dad, MD (Inactive) as Consulting Physician (Hematology and Oncology) Belva Crome, MD as Consulting Physician (Cardiology) Marica Otter, OD (Optometry)  Indicate any recent Medical Services you may have received from other than [facility] providers in the past year (date may be approximate).     Assessment:   This is a routine wellness examination for Seung.  Hearing/Vision screen Hearing Screening - Comments:: No issues  Vision Screening - Comments:: Last exam 03/2021, Dr. Sabra Heck, wears glasses  Dietary issues and exercise activities discussed: Current Exercise Habits: The patient does not participate in regular exercise at present   Goals Addressed             This Visit's Progress    Patient Stated       Would like to maintain current routine       Depression Screen PHQ 2/9 Scores 06/05/2021 06/04/2020 12/28/2019 05/31/2019 02/02/2019 10/18/2017 02/11/2017  PHQ - 2 Score 0 0 0 1 0 0 0  PHQ- 9 Score - 0 - 6 - - -    Fall Risk Fall Risk  06/05/2021 06/04/2020 05/31/2019  02/02/2019 02/11/2017  Falls in the past year? 0 0 0 0 No  Number falls in past yr: 0 0 - - -  Injury with Fall? 0 0 - - -  Risk for fall due to : No Fall Risks Medication side effect - - -  Follow up Falls prevention discussed Falls evaluation completed;Falls prevention discussed - - -    FALL RISK PREVENTION PERTAINING TO THE HOME:  Any stairs in or around the home? Yes  If so, are there any without handrails? Yes  Home free of loose throw rugs in walkways, pet beds, electrical cords, etc? Yes  Adequate lighting in your home to reduce risk of falls? Yes   ASSISTIVE DEVICES UTILIZED TO PREVENT FALLS:  Life alert? No  Use of a cane, walker or w/c? No  Grab bars in the bathroom? Yes  Shower chair or bench in shower? No  Elevated toilet seat or a handicapped toilet? Yes   TIMED UP AND GO:  Was the test performed? No .    Cognitive Function: Normal cognitive status assessed by  this Nurse Health Advisor. No abnormalities found.   MMSE - Mini Mental State Exam 06/04/2020  Orientation to time 5  Orientation to Place 5  Registration 3  Attention/ Calculation 5  Recall 3  Language- repeat 1        Immunizations Immunization History  Administered Date(s) Administered   Influenza Whole 01/22/2009  Influenza, High Dose Seasonal PF 12/27/2017, 12/20/2018, 01/09/2020, 01/15/2021   Influenza, Seasonal, Injecte, Preservative Fre 01/18/2014   Influenza,inj,Quad PF,6+ Mos 01/20/2015   Influenza-Unspecified 01/18/2013, 01/18/2014, 01/30/2016, 01/06/2017   PFIZER Comirnaty(Gray Top)Covid-19 Tri-Sucrose Vaccine 07/31/2020   PFIZER(Purple Top)SARS-COV-2 Vaccination 05/28/2019, 06/22/2019, 01/09/2020   Pfizer Covid-19 Vaccine Bivalent Booster 69yrs & up 01/15/2021   Pneumococcal Conjugate-13 10/22/2016   Pneumococcal Polysaccharide-23 05/06/2011, 10/18/2017   Td 08/21/2004   Tdap 05/29/2014   Zoster Recombinat (Shingrix) 04/14/2017, 06/18/2017   Zoster, Live 08/23/2013    TDAP  status: Up to date  Flu Vaccine status: Up to date  Pneumococcal vaccine status: Up to date  Covid-19 vaccine status: Completed vaccines  Qualifies for Shingles Vaccine? Yes   Zostavax completed Yes   Shingrix Completed?: Yes  Screening Tests Health Maintenance  Topic Date Due   OPHTHALMOLOGY EXAM  10/18/2020   HEMOGLOBIN A1C  06/11/2021   FOOT EXAM  12/09/2021   COLONOSCOPY (Pts 45-72yrs Insurance coverage will need to be confirmed)  07/21/2023   TETANUS/TDAP  05/29/2024   Pneumonia Vaccine 34+ Years old  Completed   INFLUENZA VACCINE  Completed   COVID-19 Vaccine  Completed   Hepatitis C Screening  Completed   Zoster Vaccines- Shingrix  Completed   HPV VACCINES  Aged Out    Health Maintenance  Health Maintenance Due  Topic Date Due   OPHTHALMOLOGY EXAM  10/18/2020    Colorectal cancer screening: Type of screening: Colonoscopy. Completed 07/20/13. Repeat every 10 years  Lung Cancer Screening: (Low Dose CT Chest recommended if Age 56-80 years, 30 pack-year currently smoking OR have quit w/in 15years.) does not qualify.     Additional Screening:  Hepatitis C Screening: does qualify; Completed 05/27/15  Vision Screening: Recommended annual ophthalmology exams for early detection of glaucoma and other disorders of the eye. Is the patient up to date with their annual eye exam?  Yes  Who is the provider or what is the name of the office in which the patient attends annual eye exams? Dr. Sabra Heck    Dental Screening: Recommended annual dental exams for proper oral hygiene  Community Resource Referral / Chronic Care Management: CRR required this visit?  No   CCM required this visit?  No      Plan:     I have personally reviewed and noted the following in the patients chart:   Medical and social history Use of alcohol, tobacco or illicit drugs  Current medications and supplements including opioid prescriptions. Patient is not currently taking opioid  prescriptions. Functional ability and status Nutritional status Physical activity Advanced directives List of other physicians Hospitalizations, surgeries, and ER visits in previous 12 months Vitals Screenings to include cognitive, depression, and falls Referrals and appointments  In addition, I have reviewed and discussed with patient certain preventive protocols, quality metrics, and best practice recommendations. A written personalized care plan for preventive services as well as general preventive health recommendations were provided to patient.   Due to this being a telephonic visit, the after visit summary with patients personalized plan was offered to patient via mail or my-chart.  Patient would like to access on my-chart.    Loma Messing, LPN   [id number]   Nurse Health Advisor  Nurse Notes: none

## 2021-06-05 ENCOUNTER — Ambulatory Visit (INDEPENDENT_AMBULATORY_CARE_PROVIDER_SITE_OTHER): Payer: Medicare HMO

## 2021-06-05 VITALS — Ht 67.0 in | Wt 209.0 lb

## 2021-06-05 DIAGNOSIS — Z0000 Encounter for general adult medical examination without abnormal findings: Secondary | ICD-10-CM

## 2021-06-05 NOTE — Patient Instructions (Signed)
[name] , Thank you for taking time to complete your Medicare Wellness Visit. I appreciate your ongoing commitment to your health goals. Please review the following plan we discussed and let me know if I can assist you in the future.   Screening recommendations/referrals: Colonoscopy: up to date, completed 07/20/13, due 07/21/23 Recommended yearly ophthalmology/optometry visit for glaucoma screening and checkup Recommended yearly dental visit for hygiene and checkup  Vaccinations: Influenza vaccine: up to date Pneumococcal vaccine: up to date  Tdap vaccine: up to date  Shingles vaccine: up to date    Covid-19: up to date   Advanced directives: Please bring a copy of Living Will and/or [place] for your chart.   Conditions/risks identified: see problem visit   Next appointment: Follow up in one year for your annual wellness visit. 06/08/22 @ 9:45am, this will be a telephone visit   Preventive Care 70 Years and Older, Male Preventive care refers to lifestyle choices and visits with your health care provider that can promote health and wellness. What does preventive care include? A yearly physical exam. This is also called an annual well check. Dental exams once or twice a year. Routine eye exams. Ask your health care provider how often you should have your eyes checked. Personal lifestyle choices, including: Daily care of your teeth and gums. Regular physical activity. Eating a healthy diet. Avoiding tobacco and drug use. Limiting alcohol use. Practicing safe sex. Taking low doses of aspirin every day. Taking vitamin and mineral supplements as recommended by your health care provider. What happens during an annual well check? The services and screenings done by your health care provider during your annual well check will depend on your age, overall health, lifestyle risk factors, and family history of disease. Counseling  Your health care provider may ask you  questions about your: Alcohol use. Tobacco use. Drug use. Emotional well-being. Home and relationship well-being. Sexual activity. Eating habits. History of falls. Memory and ability to understand (cognition). Work and work Statistician. Screening  You may have the following tests or measurements: Height, weight, and BMI. Blood pressure. Lipid and cholesterol levels. These may be checked every 5 years, or more frequently if you are over 70 years old. Skin check. Lung cancer screening. You may have this screening every year starting at age 70 if you have a 30-pack-year history of smoking and currently smoke or have quit within the past 15 years. Fecal occult blood test (FOBT) of the stool. You may have this test every year starting at age 70. Flexible sigmoidoscopy or colonoscopy. You may have a sigmoidoscopy every 5 years or a colonoscopy every 10 years starting at age 70. Prostate cancer screening. Recommendations will vary depending on your family history and other risks. Hepatitis C blood test. Hepatitis B blood test. Sexually transmitted disease (STD) testing. Diabetes screening. This is done by checking your blood sugar (glucose) after you have not eaten for a while (fasting). You may have this done every 1-3 years. Abdominal aortic aneurysm (AAA) screening. You may need this if you are a current or former smoker. Osteoporosis. You may be screened starting at age 55 if you are at high risk. Talk with your health care provider about your test results, treatment options, and if necessary, the need for more tests. Vaccines  Your health care provider may recommend certain vaccines, such as: Influenza vaccine. This is recommended every year. Tetanus, diphtheria, and acellular pertussis (Tdap, Td) vaccine. You may need a Td booster every  10 years. Zoster vaccine. You may need this after age 70. Pneumococcal 13-valent conjugate (PCV13) vaccine. One dose is recommended after age  70. Pneumococcal polysaccharide (PPSV23) vaccine. One dose is recommended after age 70. Talk to your health care provider about which screenings and vaccines you need and how often you need them. This information is not intended to replace advice given to you by your health care provider. Make sure you discuss any questions you have with your health care provider. Document Released: 05/03/2015 Document Revised: 12/25/2015 Document Reviewed: 02/05/2015 Elsevier Interactive Patient Education  2017 [place] Prevention in the Home Falls can cause injuries. They can happen to people of all ages. There are many things you can do to make your home safe and to help prevent falls. What can I do on the outside of my home? Regularly fix the edges of walkways and driveways and fix any cracks. Remove anything that might make you trip as you walk through a door, such as a raised step or threshold. Trim any bushes or trees on the path to your home. Use bright outdoor lighting. Clear any walking paths of anything that might make someone trip, such as rocks or tools. Regularly check to see if handrails are loose or broken. Make sure that both sides of any steps have handrails. Any raised decks and porches should have guardrails on the edges. Have any leaves, snow, or ice cleared regularly. Use sand or salt on walking paths during winter. Clean up any spills in your garage right away. This includes oil or grease spills. What can I do in the bathroom? Use night lights. Install grab bars by the toilet and in the tub and shower. Do not use towel bars as grab bars. Use non-skid mats or decals in the tub or shower. If you need to sit down in the shower, use a plastic, non-slip stool. Keep the floor dry. Clean up any water that spills on the floor as soon as it happens. Remove soap buildup in the tub or shower regularly. Attach bath mats securely with double-sided non-slip rug tape. Do not have throw  rugs and other things on the floor that can make you trip. What can I do in the bedroom? Use night lights. Make sure that you have a light by your bed that is easy to reach. Do not use any sheets or blankets that are too big for your bed. They should not hang down onto the floor. Have a firm chair that has side arms. You can use this for support while you get dressed. Do not have throw rugs and other things on the floor that can make you trip. What can I do in the kitchen? Clean up any spills right away. Avoid walking on wet floors. Keep items that you use a lot in easy-to-reach places. If you need to reach something above you, use a strong step stool that has a grab bar. Keep electrical cords out of the way. Do not use floor polish or wax that makes floors slippery. If you must use wax, use non-skid floor wax. Do not have throw rugs and other things on the floor that can make you trip. What can I do with my stairs? Do not leave any items on the stairs. Make sure that there are handrails on both sides of the stairs and use them. Fix handrails that are broken or loose. Make sure that handrails are as long as the stairways. Check any carpeting to make  sure that it is firmly attached to the stairs. Fix any carpet that is loose or worn. Avoid having throw rugs at the top or bottom of the stairs. If you do have throw rugs, attach them to the floor with carpet tape. Make sure that you have a light switch at the top of the stairs and the bottom of the stairs. If you do not have them, ask someone to add them for you. What else can I do to help prevent falls? Wear shoes that: Do not have high heels. Have rubber bottoms. Are comfortable and fit you well. Are closed at the toe. Do not wear sandals. If you use a stepladder: Make sure that it is fully opened. Do not climb a closed stepladder. Make sure that both sides of the stepladder are locked into place. Ask someone to hold it for you, if  possible. Clearly mark and make sure that you can see: Any grab bars or handrails. First and last steps. Where the edge of each step is. Use tools that help you move around (mobility aids) if they are needed. These include: Canes. Walkers. Scooters. Crutches. Turn on the lights when you go into a dark area. Replace any light bulbs as soon as they burn out. Set up your furniture so you have a clear path. Avoid moving your furniture around. If any of your floors are uneven, fix them. If there are any pets around you, be aware of where they are. Review your medicines with your doctor. Some medicines can make you feel dizzy. This can increase your chance of falling. Ask your doctor what other things that you can do to help prevent falls. This information is not intended to replace advice given to you by your health care provider. Make sure you discuss any questions you have with your health care provider. Document Released: 01/31/2009 Document Revised: 09/12/2015 Document Reviewed: 05/11/2014 Elsevier Interactive Patient Education  2017 [employer].

## 2021-06-09 ENCOUNTER — Other Ambulatory Visit: Payer: Self-pay

## 2021-06-09 ENCOUNTER — Other Ambulatory Visit (INDEPENDENT_AMBULATORY_CARE_PROVIDER_SITE_OTHER): Payer: Medicare HMO

## 2021-06-09 DIAGNOSIS — E785 Hyperlipidemia, unspecified: Secondary | ICD-10-CM | POA: Diagnosis not present

## 2021-06-09 DIAGNOSIS — E790 Hyperuricemia without signs of inflammatory arthritis and tophaceous disease: Secondary | ICD-10-CM

## 2021-06-09 DIAGNOSIS — N138 Other obstructive and reflux uropathy: Secondary | ICD-10-CM | POA: Diagnosis not present

## 2021-06-09 DIAGNOSIS — E1169 Type 2 diabetes mellitus with other specified complication: Secondary | ICD-10-CM

## 2021-06-09 DIAGNOSIS — E559 Vitamin D deficiency, unspecified: Secondary | ICD-10-CM

## 2021-06-09 DIAGNOSIS — N401 Enlarged prostate with lower urinary tract symptoms: Secondary | ICD-10-CM

## 2021-06-09 DIAGNOSIS — D508 Other iron deficiency anemias: Secondary | ICD-10-CM | POA: Diagnosis not present

## 2021-06-09 DIAGNOSIS — E1142 Type 2 diabetes mellitus with diabetic polyneuropathy: Secondary | ICD-10-CM

## 2021-06-09 LAB — CBC WITH DIFFERENTIAL/PLATELET
Basophils Absolute: 0.1 10*3/uL (ref 0.0–0.1)
Basophils Relative: 0.9 % (ref 0.0–3.0)
Eosinophils Absolute: 0.2 10*3/uL (ref 0.0–0.7)
Eosinophils Relative: 3.3 % (ref 0.0–5.0)
HCT: 45 % (ref 39.0–52.0)
Hemoglobin: 15.4 g/dL (ref 13.0–17.0)
Lymphocytes Relative: 26.6 % (ref 12.0–46.0)
Lymphs Abs: 1.7 10*3/uL (ref 0.7–4.0)
MCHC: 34.2 g/dL (ref 30.0–36.0)
MCV: 88.2 fl (ref 78.0–100.0)
Monocytes Absolute: 0.5 10*3/uL (ref 0.1–1.0)
Monocytes Relative: 8.1 % (ref 3.0–12.0)
Neutro Abs: 4 10*3/uL (ref 1.4–7.7)
Neutrophils Relative %: 61.1 % (ref 43.0–77.0)
Platelets: 173 10*3/uL (ref 150.0–400.0)
RBC: 5.1 Mil/uL (ref 4.22–5.81)
RDW: 13.9 % (ref 11.5–15.5)
WBC: 6.5 10*3/uL (ref 4.0–10.5)

## 2021-06-09 LAB — COMPREHENSIVE METABOLIC PANEL
ALT: 30 U/L (ref 0–53)
AST: 27 U/L (ref 0–37)
Albumin: 4.1 g/dL (ref 3.5–5.2)
Alkaline Phosphatase: 41 U/L (ref 39–117)
BUN: 29 mg/dL — ABNORMAL HIGH (ref 6–23)
CO2: 31 mEq/L (ref 19–32)
Calcium: 9.8 mg/dL (ref 8.4–10.5)
Chloride: 105 mEq/L (ref 96–112)
Creatinine, Ser: 1.5 mg/dL (ref 0.40–1.50)
GFR: 47.05 mL/min — ABNORMAL LOW (ref >60.00)
Glucose, Bld: 118 mg/dL — ABNORMAL HIGH (ref 70–99)
Potassium: 4.5 mEq/L (ref 3.5–5.1)
Sodium: 140 mEq/L (ref 135–145)
Total Bilirubin: 0.5 mg/dL (ref 0.2–1.2)
Total Protein: 6.6 g/dL (ref 6.0–8.3)

## 2021-06-09 LAB — IBC PANEL
Iron: 44 ug/dL (ref 42–165)
Saturation Ratios: 12.2 % — ABNORMAL LOW (ref 20.0–50.0)
TIBC: 359.8 ug/dL (ref 250.0–450.0)
Transferrin: 257 mg/dL (ref 212.0–360.0)

## 2021-06-09 LAB — LIPID PANEL
Cholesterol: 102 mg/dL (ref 0–200)
HDL: 32.2 mg/dL — ABNORMAL LOW (ref >39.00)
LDL Cholesterol: 49 mg/dL (ref 0–99)
NonHDL: 69.36
Total CHOL/HDL Ratio: 3
Triglycerides: 100 mg/dL (ref 0.0–149.0)
VLDL: 20 mg/dL (ref 0.0–40.0)

## 2021-06-09 LAB — VITAMIN D 25 HYDROXY (VIT D DEFICIENCY, FRACTURES): VITD: 60.68 ng/mL (ref 30.00–100.00)

## 2021-06-09 LAB — HEMOGLOBIN A1C: Hgb A1c MFr Bld: 8.7 % — ABNORMAL HIGH (ref 4.6–6.5)

## 2021-06-09 LAB — PSA: PSA: 0.3 ng/mL (ref 0.10–4.00)

## 2021-06-09 LAB — FERRITIN: Ferritin: 89.8 ng/mL (ref 22.0–322.0)

## 2021-06-09 LAB — URIC ACID: Uric Acid, Serum: 6 mg/dL (ref 4.0–7.8)

## 2021-06-09 NOTE — Addendum Note (Signed)
Addended by: Ellamae Sia on: 06/09/2021 08:45 AM   Modules accepted: Orders

## 2021-06-15 IMAGING — DX DG CHEST 1V PORT
1 series · 1 of 1 positions shown · non-contrast
Comparison: Portable exam 9906 hours without priors for comparison.

CLINICAL DATA: STEMI, history coronary artery disease, type II
diabetes mellitus, former smoker, GERD, hypertension

EXAM:
PORTABLE CHEST 1 VIEW

[chest ap]
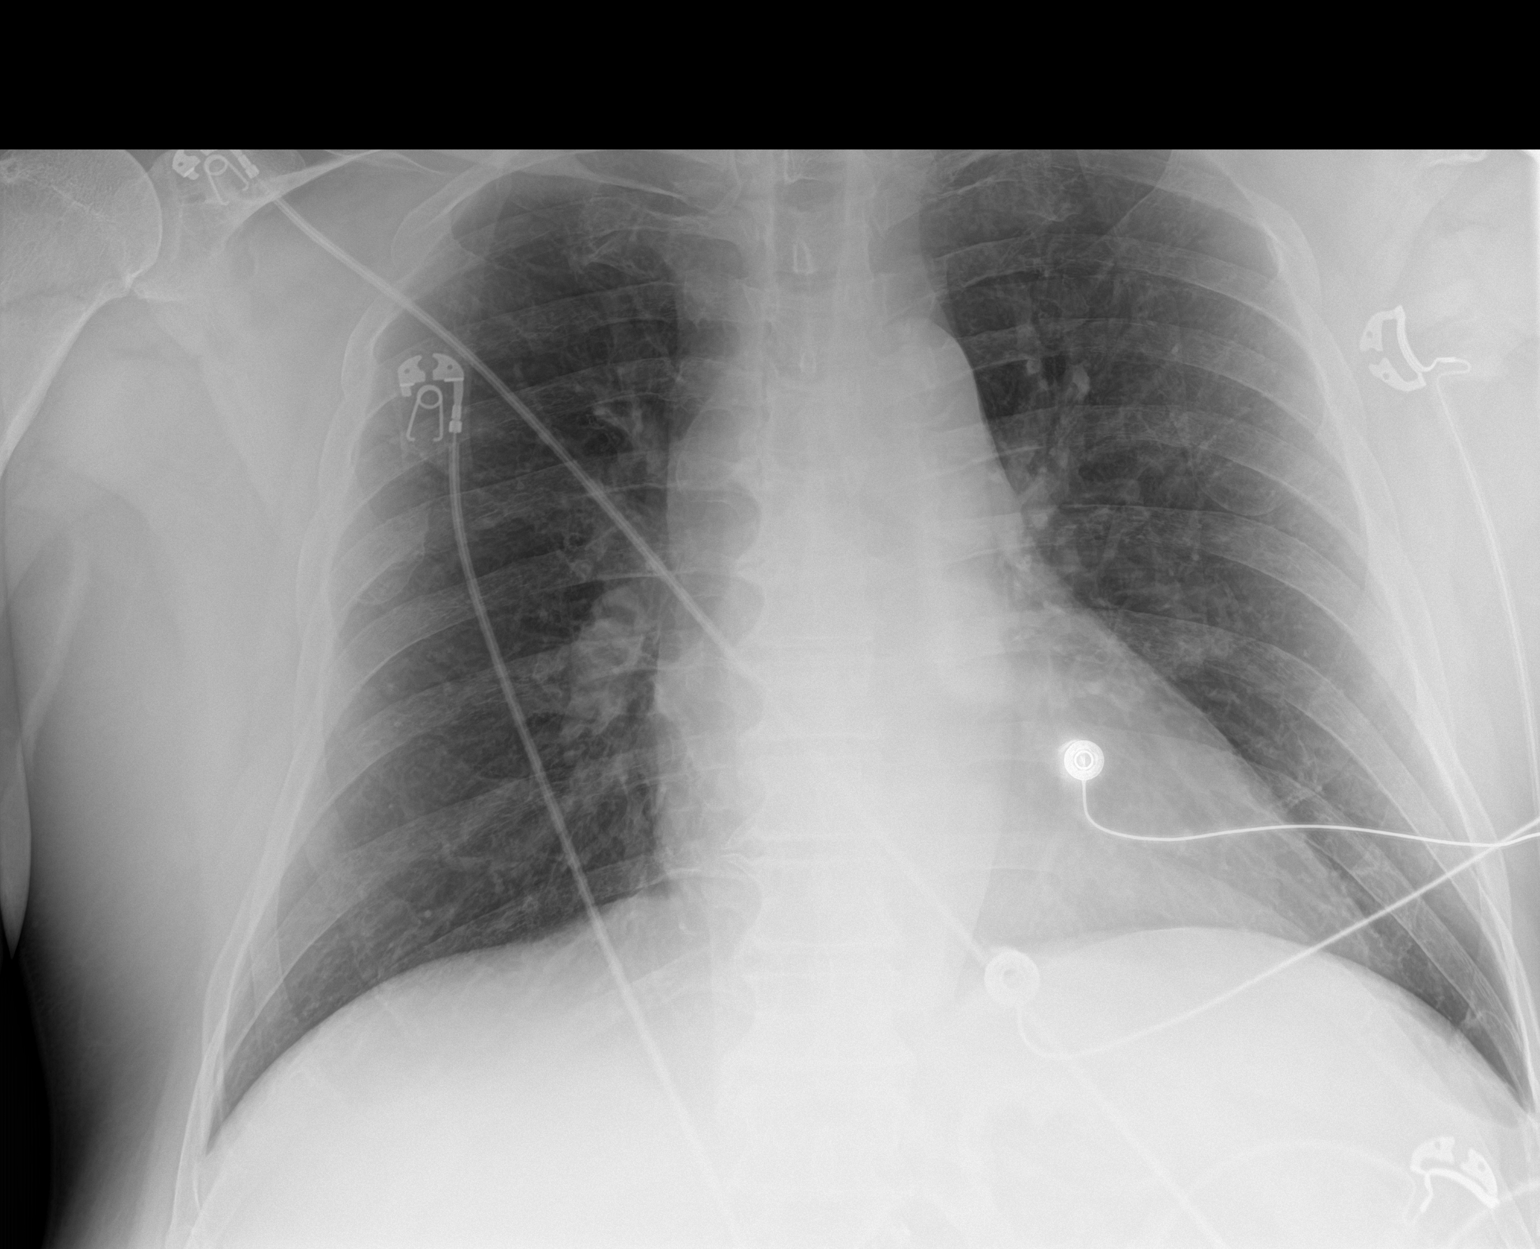

[1 of 1 positions shown; findings below may reference images not displayed]

FINDINGS: Normal heart size, mediastinal contours, and pulmonary vascularity.

Lungs clear.

No pulmonary infiltrate, pleural effusion or pneumothorax.

Scattered endplate spur formation thoracic spine.

No acute osseous findings.
IMPRESSION: No acute abnormalities.

## 2021-06-16 ENCOUNTER — Ambulatory Visit (INDEPENDENT_AMBULATORY_CARE_PROVIDER_SITE_OTHER): Payer: Medicare HMO | Admitting: Family Medicine

## 2021-06-16 ENCOUNTER — Other Ambulatory Visit: Payer: Self-pay

## 2021-06-16 ENCOUNTER — Encounter: Payer: Self-pay | Admitting: Family Medicine

## 2021-06-16 ENCOUNTER — Ambulatory Visit (INDEPENDENT_AMBULATORY_CARE_PROVIDER_SITE_OTHER)
Admission: RE | Admit: 2021-06-16 | Discharge: 2021-06-16 | Disposition: A | Discharge status: Home / Self Care | Payer: Medicare HMO | Source: Ambulatory Visit | Attending: Family Medicine | Admitting: Family Medicine

## 2021-06-16 VITALS — BP 126/74 | HR 92 | Temp 97.6°F | Ht 67.0 in | Wt 207.0 lb

## 2021-06-16 DIAGNOSIS — M25571 Pain in right ankle and joints of right foot: Secondary | ICD-10-CM

## 2021-06-16 DIAGNOSIS — N138 Other obstructive and reflux uropathy: Secondary | ICD-10-CM

## 2021-06-16 DIAGNOSIS — Z7189 Other specified counseling: Secondary | ICD-10-CM

## 2021-06-16 DIAGNOSIS — I10 Essential (primary) hypertension: Secondary | ICD-10-CM | POA: Diagnosis not present

## 2021-06-16 DIAGNOSIS — D508 Other iron deficiency anemias: Secondary | ICD-10-CM

## 2021-06-16 DIAGNOSIS — E1169 Type 2 diabetes mellitus with other specified complication: Secondary | ICD-10-CM | POA: Diagnosis not present

## 2021-06-16 DIAGNOSIS — I2510 Atherosclerotic heart disease of native coronary artery without angina pectoris: Secondary | ICD-10-CM | POA: Diagnosis not present

## 2021-06-16 DIAGNOSIS — N401 Enlarged prostate with lower urinary tract symptoms: Secondary | ICD-10-CM | POA: Diagnosis not present

## 2021-06-16 DIAGNOSIS — R0789 Other chest pain: Secondary | ICD-10-CM

## 2021-06-16 DIAGNOSIS — E1142 Type 2 diabetes mellitus with diabetic polyneuropathy: Secondary | ICD-10-CM | POA: Diagnosis not present

## 2021-06-16 DIAGNOSIS — Z0000 Encounter for general adult medical examination without abnormal findings: Secondary | ICD-10-CM | POA: Diagnosis not present

## 2021-06-16 DIAGNOSIS — R079 Chest pain, unspecified: Secondary | ICD-10-CM | POA: Diagnosis not present

## 2021-06-16 DIAGNOSIS — Z87891 Personal history of nicotine dependence: Secondary | ICD-10-CM

## 2021-06-16 DIAGNOSIS — E785 Hyperlipidemia, unspecified: Secondary | ICD-10-CM

## 2021-06-16 DIAGNOSIS — K219 Gastro-esophageal reflux disease without esophagitis: Secondary | ICD-10-CM

## 2021-06-16 DIAGNOSIS — E559 Vitamin D deficiency, unspecified: Secondary | ICD-10-CM

## 2021-06-16 DIAGNOSIS — E790 Hyperuricemia without signs of inflammatory arthritis and tophaceous disease: Secondary | ICD-10-CM

## 2021-06-16 DIAGNOSIS — K760 Fatty (change of) liver, not elsewhere classified: Secondary | ICD-10-CM

## 2021-06-16 DIAGNOSIS — E669 Obesity, unspecified: Secondary | ICD-10-CM

## 2021-06-16 MED ORDER — OMEPRAZOLE 40 MG PO CPDR: 40.0000 mg | DELAYED_RELEASE_CAPSULE | Freq: Every day | ORAL | 1 refills | Status: DC

## 2021-06-16 MED ORDER — IRON (FERROUS SULFATE) 325 (65 FE) MG PO TABS: 325.0000 mg | ORAL_TABLET | ORAL | Status: AC

## 2021-06-16 MED ORDER — PROBENECID 500 MG PO TABS: 250.0000 mg | ORAL_TABLET | ORAL | 3 refills | Status: DC

## 2021-06-16 MED ORDER — TAMSULOSIN HCL 0.4 MG PO CAPS: 0.4000 mg | ORAL_CAPSULE | Freq: Every day | ORAL | 3 refills | Status: DC

## 2021-06-16 NOTE — Progress Notes (Addendum)
Patient ID: Alan Morrison, male    DOB: Jan 24, 1951, 71 y.o.   MRN: [id number]  This visit was conducted in person.  BP 126/74    Pulse 92    Temp 97.6 F (36.4 C) (Temporal)    Ht 5\' 7"  (1.702 m)    Wt 207 lb (93.9 kg)    SpO2 97%    BMI 32.42 kg/m    CC: CPE Subjective:   HPI: Alan Morrison is a 71 y.o. male presenting on 06/16/2021 for Medicare Wellness   Saw health advisor earlier this month for medicare wellness visit. Note reviewed.    Hearing Screening   500Hz  1000Hz  2000Hz  4000Hz   Right ear 25 25 25 25   Left ear 25 25 25 25   Vision Screening - Comments:: Recent eye exam at [facility] vision sending for notes.   Flowsheet Row Clinical Support from 06/05/2021 in [place] [facility] [place]  PHQ-2 Total Score 0       Fall Risk  06/05/2021 06/04/2020 05/31/2019 02/02/2019 02/11/2017  Falls in the past year? 0 0 0 0 No  Number falls in past yr: 0 0 - - -  Injury with Fall? 0 0 - - -  Risk for fall due to : No Fall Risks Medication side effect - - -  Follow up Falls prevention discussed Falls evaluation completed;Falls prevention discussed - - -    MI (inferior wall STEMI) 10/2019 s/p PCI with DES to RCA - treated with DAPT aspirin/plavix x1 yr total. Now on aspirin, metoprolol, rosuvastatin, prn nitro.   H/o L femoral DVT 04/2018 s/p xarelto treatment through 11/2019. No unilateral leg swelling or dyspnea.    DM - established with [facility] clinic endocrinology [facility] NP --> Dr Gabriel Carina) last seen 03/2021 - on metformin 1000mg  daily, basaglar 03J daily, trulicity started 0.75mg  weekly. Monitors sugars BID with one touch glucometer.   6 months of chest discomfort with swallowing/eating. No pressure pain or exertional pain. No dysphagia. Occasional GERD symptoms managed with tums.    Preventative: COLONOSCOPY Date: 07/2013 1 benign polyp rpt 10 yrs [name])  Prostate - normal in the past. Would like to continue checking yearly. No prostate symptoms.  Lung cancer  screening - not eligible  AAA screen - no fmhx - not eligible  Flu shot yearly at work  [place] 2/201, 06/2019, booster 12/2019, 07/2020, bivalent 12/2020 Pneumovax 2013, 2019, prevnar-13 2018.  Tdap 2016  Zostavax - 08/2013  Shingrix - 03/2017, 06/2017  Advanced planning discussion. Has at home. Wife is HCPOA. Does not want prolonged life support if terminal condition. We have asked him to bring [place] a copy. Seat belt use discussed.  Sunscreen use discussed. No changing moles.  Ex smoker - quit remotely - 1972 - 4 PY hx  Alcohol - occasional beer  Dentist - Q70mo  Eye exam - yearly  Bowel - no constipation Bladder - no incontinence  Lives with wife  Occupation: equipment maintenance  Activity: [facility] [employer]  Diet: good water, daily fruits/vegetables, diet drinks      Relevant past medical, surgical, family and social history reviewed and updated as indicated. Interim medical history since our last visit reviewed. Allergies and medications reviewed and updated. Outpatient Medications Prior to Visit  Medication Sig Dispense Refill   acetaminophen (TYLENOL) 500 MG tablet Take 1,000 mg by mouth 2 (two) times daily as needed for headache (pain).     aspirin EC 81 MG tablet Take 81 mg by  mouth daily with supper. Swallow whole.     Calcium Carbonate Antacid (TUMS PO) Take 1 tablet by mouth daily as needed (acid reflux/indigestion).     Cholecalciferol (VITAMIN D) 2000 units tablet Take 2,000 Units by mouth daily with breakfast.      finasteride (PROSCAR) 5 MG tablet TAKE 1 TABLET BY MOUTH EVERY DAY 90 tablet 3   Insulin Glargine (BASAGLAR KWIKPEN) 100 UNIT/ML Inject 75 Units into the skin at bedtime.     Insulin Pen Needle (B-D ULTRAFINE III SHORT PEN) 31G X 8 MM MISC 1 Device by Other route 4 (four) times daily. USE AS DIRECTED DAILY 120 each 11   losartan (COZAAR) 25 MG tablet Take 1 tablet (25 mg total) by mouth daily. 90 tablet 3   metFORMIN (GLUCOPHAGE) 1000 MG tablet  TAKE 1 TABLET BY MOUTH EVERY DAY AT BREAKFAST 90 tablet 3   metoprolol tartrate (LOPRESSOR) 25 MG tablet Take 0.5 tablets (12.5 mg total) by mouth 2 (two) times daily. 90 tablet 3   Multiple Vitamin (MULTIVITAMIN WITH MINERALS) TABS tablet Take 1 tablet by mouth daily with breakfast.      nitroGLYCERIN (NITROSTAT) 0.4 MG SL tablet PLACE 1 TABLET UNDER THE TONGUE EVERY 5 MINUTES AS NEEDED FOR CHEST PAIN 25 tablet 11   ONETOUCH ULTRA test strip USE TO CHECK SUGARS DAILY  AS DIRECTED 100 strip 3   rosuvastatin (CRESTOR) 40 MG tablet Take 1 tablet (40 mg total) by mouth daily with supper. 90 tablet 3   probenecid (BENEMID) 500 MG tablet Take 0.5 tablets (250 mg total) by mouth every other day. 45 tablet 3   tamsulosin (FLOMAX) 0.4 MG CAPS capsule Take 1 capsule (0.4 mg total) by mouth daily. 90 capsule 3   vitamin C (ASCORBIC ACID) 500 MG tablet Take 500 mg by mouth daily with supper.      TRULICITY 1.93 XT/0.2IO SOPN SMARTSIG:0.5 Milliliter(s) SUB-Q Once a Week     doxycycline (VIBRA-TABS) 100 MG tablet Take 1 tablet (100 mg total) by mouth 2 (two) times daily. (Patient not taking: Reported on 06/05/2021) 10 tablet 0   No facility-administered medications prior to visit.     Per HPI unless specifically indicated in ROS section below Review of Systems  Constitutional:  Negative for activity change, appetite change, chills, fatigue, fever and unexpected weight change.  HENT:  Negative for hearing loss.   Eyes:  Negative for visual disturbance.  Respiratory:  Negative for cough, chest tightness, shortness of breath and wheezing.   Cardiovascular:  Positive for chest pain (with eating and swallowing). Negative for palpitations and leg swelling.  Gastrointestinal:  Negative for abdominal distention, abdominal pain, blood in stool, constipation, diarrhea, nausea and vomiting.  Genitourinary:  Negative for difficulty urinating and hematuria.  Musculoskeletal:  Negative for arthralgias, myalgias and neck  pain.  Skin:  Negative for rash.  Neurological:  Negative for dizziness, seizures, syncope and headaches.  Hematological:  Negative for adenopathy. Bruises/bleeds easily (aspirin related).  Psychiatric/Behavioral:  Negative for dysphoric mood. The patient is not nervous/anxious.    Objective:  BP 126/74    Pulse 92    Temp 97.6 F (36.4 C) (Temporal)    Ht 5\' 7"  (1.702 m)    Wt 207 lb (93.9 kg)    SpO2 97%    BMI 32.42 kg/m   Wt Readings from Last 3 Encounters:  06/16/21 207 lb (93.9 kg)  06/05/21 209 lb (94.8 kg)  03/20/21 209 lb (94.8 kg)  Physical Exam Vitals and nursing note reviewed.  Constitutional:      General: He is not in acute distress.    Appearance: Normal appearance. He is well-developed. He is not ill-appearing.  HENT:     Head: Normocephalic and atraumatic.     Right Ear: Hearing, tympanic membrane, ear canal and external ear normal.     Left Ear: Hearing, tympanic membrane, ear canal and external ear normal.  Eyes:     General: No scleral icterus.    Extraocular Movements: Extraocular movements intact.     Conjunctiva/sclera: Conjunctivae normal.     Pupils: Pupils are equal, round, and reactive to light.  Neck:     Thyroid: No thyroid mass or thyromegaly.  Cardiovascular:     Rate and Rhythm: Normal rate and regular rhythm.     Pulses: Normal pulses.          Radial pulses are 2+ on the right side and 2+ on the left side.     Heart sounds: Normal heart sounds. No murmur heard. Pulmonary:     Effort: Pulmonary effort is normal. No respiratory distress.     Breath sounds: Normal breath sounds. No wheezing, rhonchi or rales.  Abdominal:     General: Bowel sounds are normal. There is no distension.     Palpations: Abdomen is soft. There is no mass.     Tenderness: There is no abdominal tenderness. There is no guarding or rebound.     Hernia: No hernia is present.  Musculoskeletal:        General: Tenderness present. No swelling. Normal range of motion.      Cervical back: Normal range of motion and neck supple.     Right lower leg: No edema.     Left lower leg: No edema.     Comments:  Discomfort to palpation/percussion of R medial ankle at anterior malleolus without bruising, erythema, or skin changes. 2+ DP bilaterally No pain at navicular or base of 5th MT bilaterally No ligament laxity with testing bilateral ankle ligaments No posterior malleolar pain  Lymphadenopathy:     Cervical: No cervical adenopathy.  Skin:    General: Skin is warm and dry.     Findings: No rash.  Neurological:     General: No focal deficit present.     Mental Status: He is alert and oriented to person, place, and time.  Psychiatric:        Mood and Affect: Mood normal.        Behavior: Behavior normal.        Thought Content: Thought content normal.        Judgment: Judgment normal.      Results for orders placed or performed in visit on 06/09/21  Uric acid  Result Value Ref Range   Uric Acid, Serum 6.0 4.0 - 7.8 mg/dL  CBC with Differential/Platelet  Result Value Ref Range   WBC 6.5 4.0 - 10.5 K/uL   RBC 5.10 4.22 - 5.81 Mil/uL   Hemoglobin 15.4 13.0 - 17.0 g/dL   HCT 45.0 39.0 - 52.0 %   MCV 88.2 78.0 - 100.0 fl   MCHC 34.2 30.0 - 36.0 g/dL   RDW 13.9 11.5 - 15.5 %   Platelets 173.0 150.0 - 400.0 K/uL   Neutrophils Relative % 61.1 43.0 - 77.0 %   Lymphocytes Relative 26.6 12.0 - 46.0 %   Monocytes Relative 8.1 3.0 - 12.0 %   Eosinophils Relative 3.3 0.0 - 5.0 %  Basophils Relative 0.9 0.0 - 3.0 %   Neutro Abs 4.0 1.4 - 7.7 K/uL   Lymphs Abs 1.7 0.7 - 4.0 K/uL   Monocytes Absolute 0.5 0.1 - 1.0 K/uL   Eosinophils Absolute 0.2 0.0 - 0.7 K/uL   Basophils Absolute 0.1 0.0 - 0.1 K/uL  PSA  Result Value Ref Range   PSA 0.30 0.10 - 4.00 ng/mL  Hemoglobin A1c  Result Value Ref Range   Hgb A1c MFr Bld 8.7 (H) 4.6 - 6.5 %  Comprehensive metabolic panel  Result Value Ref Range   Sodium 140 135 - 145 mEq/L   Potassium 4.5 3.5 - 5.1 mEq/L    Chloride 105 96 - 112 mEq/L   CO2 31 19 - 32 mEq/L   Glucose, Bld 118 (H) 70 - 99 mg/dL   BUN 29 (H) 6 - 23 mg/dL   Creatinine, Ser 1.50 0.40 - 1.50 mg/dL   Total Bilirubin 0.5 0.2 - 1.2 mg/dL   Alkaline Phosphatase 41 39 - 117 U/L   AST 27 0 - 37 U/L   ALT 30 0 - 53 U/L   Total Protein 6.6 6.0 - 8.3 g/dL   Albumin 4.1 3.5 - 5.2 g/dL   GFR 47.05 (L) >60.00 mL/min   Calcium 9.8 8.4 - 10.5 mg/dL  Lipid panel  Result Value Ref Range   Cholesterol 102 0 - 200 mg/dL   Triglycerides 100.0 0.0 - 149.0 mg/dL   HDL 32.20 (L) >39.00 mg/dL   VLDL 20.0 0.0 - 40.0 mg/dL   LDL Cholesterol 49 0 - 99 mg/dL   Total CHOL/HDL Ratio 3    NonHDL 69.36   VITAMIN D 25 Hydroxy (Vit-D Deficiency, Fractures)  Result Value Ref Range   VITD 60.68 30.00 - 100.00 ng/mL  IBC panel  Result Value Ref Range   Iron 44 42 - 165 ug/dL   Transferrin 257.0 212.0 - 360.0 mg/dL   Saturation Ratios 12.2 (L) 20.0 - 50.0 %   TIBC 359.8 250.0 - 450.0 mcg/dL  Ferritin  Result Value Ref Range   Ferritin 89.8 22.0 - 322.0 ng/mL    Assessment & Plan:  This visit occurred during the SARS-CoV-2 public health emergency.  Safety protocols were in place, including screening questions prior to the visit, additional usage of staff PPE, and extensive cleaning of exam room while observing appropriate contact time as indicated for disinfecting solutions.   Problem List Items Addressed This Visit     Health maintenance examination - Primary (Chronic)    Preventative protocols reviewed and updated unless pt declined. Discussed healthy diet and lifestyle.       Advanced directives, counseling/discussion (Chronic)    Advanced planning discussion. Has at home. Wife is HCPOA. Does not want prolonged life support if terminal condition. We have asked him to bring [place] a copy.      Type 2 diabetes, controlled, with peripheral neuropathy (HCC)    Chronic, appreciate endo care. Sugars remain uncontrolled. Recently started trulicity,  anticipate will increase dose at next endo f/u.       Relevant Medications   TRULICITY 8.84 ZY/6.0YT SOPN   CAD (coronary artery disease), native coronary artery    Appreciate cardiology care.       Benign prostatic hyperplasia with urinary obstruction    Continue finasteride and flomax. PSA appropriately low.      Relevant Medications   tamsulosin (FLOMAX) 0.4 MG CAPS capsule   Essential hypertension, benign    Chronic, stable on current regimen.  Obesity, Class I, BMI 30-34.9    Continue to encourage healthy diet and lifestyle choices to affect sustainable weight loss.       Vitamin D deficiency    Continue vit D 2000 IU daily.       Hyperlipidemia associated with type 2 diabetes mellitus (HCC)    Chronic, stable on high potency crestor 40mg  daily. The ASCVD Risk score (Arnett DK, et al., 2019) failed to calculate for the following reasons:   The patient has a prior MI or stroke diagnosis       Relevant Medications   TRULICITY 4.26 ST/4.1DQ SOPN   Ex-smoker    Quit remotely      Hyperuricemia    Gout levels stable on probenecid 1/2 tab QOD.  Advised stop vit C as per below.       Fatty liver    LFTs normal.       GERD (gastroesophageal reflux disease)    Has been managing with tums PRN.  With new chest discomfort associated with eating/swallowing, will Rx PPI x 3 wks then reassess. No dysphagia or vomiting. Discussed if ongoing symptoms, to refer to GI for further evaluation.       Relevant Medications   omeprazole (PRILOSEC) 40 MG capsule   Anemia, iron deficiency, inadequate dietary intake    Anemia has resolved. Iron levels overall stable but % saturation remains low. Continues iron MWF.      Relevant Medications   Iron, Ferrous Sulfate, 325 (65 Fe) MG TABS   Other chest pain    Not consistent with anginal pain.  Given relation to swallowing, eating and occasional GERD symptoms, anticipate GI related symptoms. Will trial PPI daily, rec hold vit  C, and if ongoing symptoms consider GI evaluation.  Check CXR today.       Relevant Orders   DG Chest 2 View   Right ankle pain    Benign exam. Trial voltaren gel topically, update with effect.         Meds ordered this encounter  Medications   Iron, Ferrous Sulfate, 325 (65 Fe) MG TABS    Sig: Take 325 mg by mouth every other day.   omeprazole (PRILOSEC) 40 MG capsule    Sig: Take 1 capsule (40 mg total) by mouth daily. For 3 weeks then as needed    Dispense:  30 capsule    Refill:  1   probenecid (BENEMID) 500 MG tablet    Sig: Take 0.5 tablets (250 mg total) by mouth every other day.    Dispense:  45 tablet    Refill:  3   DISCONTD: tamsulosin (FLOMAX) 0.4 MG CAPS capsule    Sig: Take 1 capsule (0.4 mg total) by mouth daily.    Dispense:  90 capsule    Refill:  3   tamsulosin (FLOMAX) 0.4 MG CAPS capsule    Sig: Take 1 capsule (0.4 mg total) by mouth daily.    Dispense:  90 capsule    Refill:  3   Orders Placed This Encounter  Procedures   DG Chest 2 View    Standing Status:   Future    Number of Occurrences:   1    Standing Expiration Date:   06/16/2022    Order Specific Question:   Reason for Exam (SYMPTOM  OR DIAGNOSIS REQUIRED)    Answer:   chest pain with swallowing x 18months    Order Specific Question:   Preferred imaging location?    Answer:  [facility]    Patient instructions: For R medial ankle pain, may try voltaren gel topically over the counter 2-3 times a day for 1 week.  Chest xray today.  For chest discomfort, try omeprazole 40mg  daily for 3 weeks. Update [place] with effect. If no better, let us know for referral to GI. Stop vitamin C supplement.  Bring [place] a copy of your living will Return as needed or in 3-4 months for follow up visit.   Follow up plan: Return in about 4 months (around 10/14/2021) for follow up visit.  [name], MD

## 2021-06-16 NOTE — Assessment & Plan Note (Signed)
Preventative protocols reviewed and updated unless pt declined. Discussed healthy diet and lifestyle.  

## 2021-06-16 NOTE — Patient Instructions (Addendum)
For R medial ankle pain, may try voltaren gel topically over the counter 2-3 times a day for 1 week.  Chest xray today.  For chest discomfort, try omeprazole 40mg  daily for 3 weeks. Update [place] with effect. If no better, let us know for referral to GI. Stop vitamin C supplement.  Bring [place] a copy of your living will Return as needed or in 3-4 months for follow up visit.   Health Maintenance After Age 70 After age 40, you are at a higher risk for certain long-term diseases and infections as well as injuries from falls. Falls are a major cause of broken bones and head injuries in people who are older than age 67. Getting regular preventive care can help to keep you healthy and well. Preventive care includes getting regular testing and making lifestyle changes as recommended by your health care provider. Talk with your health care provider about: Which screenings and tests you should have. A screening is a test that checks for a disease when you have no symptoms. A diet and exercise plan that is right for you. What should I know about screenings and tests to prevent falls? Screening and testing are the best ways to find a health problem early. Early diagnosis and treatment give you the best chance of managing medical conditions that are common after age 7. Certain conditions and lifestyle choices may make you more likely to have a fall. Your health care provider may recommend: Regular vision checks. Poor vision and conditions such as cataracts can make you more likely to have a fall. If you wear glasses, make sure to get your prescription updated if your vision changes. Medicine review. Work with your health care provider to regularly review all of the medicines you are taking, including over-the-counter medicines. Ask your health care provider about any side effects that may make you more likely to have a fall. Tell your health care provider if any medicines that you take make you feel dizzy or  sleepy. Strength and balance checks. Your health care provider may recommend certain tests to check your strength and balance while standing, walking, or changing positions. Foot health exam. Foot pain and numbness, as well as not wearing proper footwear, can make you more likely to have a fall. Screenings, including: Osteoporosis screening. Osteoporosis is a condition that causes the bones to get weaker and break more easily. Blood pressure screening. Blood pressure changes and medicines to control blood pressure can make you feel dizzy. Depression screening. You may be more likely to have a fall if you have a fear of falling, feel depressed, or feel unable to do activities that you used to do. Alcohol use screening. Using too much alcohol can affect your balance and may make you more likely to have a fall. Follow these instructions at home: Lifestyle Do not drink alcohol if: Your health care provider tells you not to drink. If you drink alcohol: Limit how much you have to: 0-1 drink a day for women. 0-2 drinks a day for men. Know how much alcohol is in your drink. In the [place] one drink equals one 12 oz bottle of beer (355 mL), one 5 oz glass of wine (148 mL), or one 1 oz glass of hard liquor (44 mL). Do not use any products that contain nicotine or tobacco. These products include cigarettes, chewing tobacco, and vaping devices, such as e-cigarettes. If you need help quitting, ask your health care provider. Activity  Follow a regular exercise program to  stay fit. This will help you maintain your balance. Ask your health care provider what types of exercise are appropriate for you. If you need a cane or walker, use it as recommended by your health care provider. Wear supportive shoes that have nonskid soles. Safety  Remove any tripping hazards, such as rugs, cords, and clutter. Install safety equipment such as grab bars in bathrooms and safety rails on stairs. Keep rooms and walkways  well-lit. General instructions Talk with your health care provider about your risks for falling. Tell your health care provider if: You fall. Be sure to tell your health care provider about all falls, even ones that seem minor. You feel dizzy, tiredness (fatigue), or off-balance. Take over-the-counter and prescription medicines only as told by your health care provider. These include supplements. Eat a healthy diet and maintain a healthy weight. A healthy diet includes low-fat dairy products, low-fat (lean) meats, and fiber from whole grains, beans, and lots of fruits and vegetables. Stay current with your vaccines. Schedule regular health, dental, and eye exams. Summary Having a healthy lifestyle and getting preventive care can help to protect your health and wellness after age 54. Screening and testing are the best way to find a health problem early and help you avoid having a fall. Early diagnosis and treatment give you the best chance for managing medical conditions that are more common for people who are older than age 57. Falls are a major cause of broken bones and head injuries in people who are older than age 89. Take precautions to prevent a fall at home. Work with your health care provider to learn what changes you can make to improve your health and wellness and to prevent falls. This information is not intended to replace advice given to you by your health care provider. Make sure you discuss any questions you have with your health care provider. Document Revised: 08/26/2020 Document Reviewed: 08/26/2020 Elsevier Patient Education  [place].

## 2021-06-16 NOTE — Assessment & Plan Note (Addendum)
Advanced planning discussion. Has at home. Wife is HCPOA. Does not want prolonged life support if terminal condition. We have asked him to bring [place] a copy.

## 2021-06-17 DIAGNOSIS — R0789 Other chest pain: Secondary | ICD-10-CM | POA: Insufficient documentation

## 2021-06-17 DIAGNOSIS — M25571 Pain in right ankle and joints of right foot: Secondary | ICD-10-CM | POA: Insufficient documentation

## 2021-06-17 NOTE — Assessment & Plan Note (Signed)
Quit remotely 

## 2021-06-17 NOTE — Assessment & Plan Note (Signed)
Continue to encourage healthy diet and lifestyle choices to affect sustainable weight loss.  °

## 2021-06-17 NOTE — Assessment & Plan Note (Signed)
Chronic, stable on high potency crestor 40mg  daily. The ASCVD Risk score (Arnett DK, et al., 2019) failed to calculate for the following reasons:   The patient has a prior MI or stroke diagnosis

## 2021-06-17 NOTE — Assessment & Plan Note (Addendum)
Not consistent with anginal pain.  Given relation to swallowing, eating and occasional GERD symptoms, anticipate GI related symptoms. Will trial PPI daily, rec hold vit C, and if ongoing symptoms consider GI evaluation.  Check CXR today.

## 2021-06-17 NOTE — Assessment & Plan Note (Addendum)
Appreciate cardiology care.  °

## 2021-06-17 NOTE — Assessment & Plan Note (Signed)
LFTs normal

## 2021-06-17 NOTE — Assessment & Plan Note (Addendum)
Anemia has resolved. Iron levels overall stable but % saturation remains low. Continues iron MWF.

## 2021-06-17 NOTE — Assessment & Plan Note (Signed)
Chronic, appreciate endo care. Sugars remain uncontrolled. Recently started trulicity, anticipate will increase dose at next endo f/u.

## 2021-06-17 NOTE — Assessment & Plan Note (Signed)
Chronic, stable on current regimen.  

## 2021-06-17 NOTE — Assessment & Plan Note (Signed)
Continue vit D 2000 IU daily.  

## 2021-06-17 NOTE — Assessment & Plan Note (Signed)
Gout levels stable on probenecid 1/2 tab QOD.  Advised stop vit C as per below.

## 2021-06-17 NOTE — Assessment & Plan Note (Addendum)
Has been managing with tums PRN.  With new chest discomfort associated with eating/swallowing, will Rx PPI x 3 wks then reassess. No dysphagia or vomiting. Discussed if ongoing symptoms, to refer to GI for further evaluation.

## 2021-06-17 NOTE — Assessment & Plan Note (Signed)
Continue finasteride and flomax. PSA appropriately low.

## 2021-06-17 NOTE — Assessment & Plan Note (Signed)
Benign exam. Trial voltaren gel topically, update with effect.

## 2021-07-02 DIAGNOSIS — E1122 Type 2 diabetes mellitus with diabetic chronic kidney disease: Secondary | ICD-10-CM | POA: Diagnosis not present

## 2021-07-02 DIAGNOSIS — Z794 Long term (current) use of insulin: Secondary | ICD-10-CM | POA: Diagnosis not present

## 2021-07-02 DIAGNOSIS — E1159 Type 2 diabetes mellitus with other circulatory complications: Secondary | ICD-10-CM | POA: Diagnosis not present

## 2021-07-02 DIAGNOSIS — E1165 Type 2 diabetes mellitus with hyperglycemia: Secondary | ICD-10-CM | POA: Diagnosis not present

## 2021-07-02 DIAGNOSIS — N1832 Chronic kidney disease, stage 3b: Secondary | ICD-10-CM | POA: Diagnosis not present

## 2021-07-02 DIAGNOSIS — E782 Mixed hyperlipidemia: Secondary | ICD-10-CM | POA: Diagnosis not present

## 2021-07-02 DIAGNOSIS — I10 Essential (primary) hypertension: Secondary | ICD-10-CM | POA: Diagnosis not present

## 2021-07-08 ENCOUNTER — Other Ambulatory Visit: Payer: Self-pay | Admitting: Family Medicine

## 2021-08-22 ENCOUNTER — Encounter: Payer: Self-pay | Admitting: Family Medicine

## 2021-09-25 ENCOUNTER — Other Ambulatory Visit: Payer: Self-pay | Admitting: Cardiology

## 2021-10-08 ENCOUNTER — Other Ambulatory Visit: Payer: Self-pay | Admitting: Family Medicine

## 2021-10-08 NOTE — Telephone Encounter (Signed)
Too soon for refill.  Rx sent 06/16/21, #45/3.

## 2021-10-09 ENCOUNTER — Other Ambulatory Visit: Payer: Self-pay | Admitting: Family Medicine

## 2021-10-14 ENCOUNTER — Encounter: Payer: Self-pay | Admitting: Family Medicine

## 2021-10-14 ENCOUNTER — Ambulatory Visit (INDEPENDENT_AMBULATORY_CARE_PROVIDER_SITE_OTHER): Payer: Medicare HMO | Admitting: Family Medicine

## 2021-10-14 VITALS — BP 118/70 | HR 88 | Temp 97.5°F | Ht 67.0 in | Wt 208.1 lb

## 2021-10-14 DIAGNOSIS — Z794 Long term (current) use of insulin: Secondary | ICD-10-CM | POA: Diagnosis not present

## 2021-10-14 DIAGNOSIS — I10 Essential (primary) hypertension: Secondary | ICD-10-CM | POA: Diagnosis not present

## 2021-10-14 DIAGNOSIS — L989 Disorder of the skin and subcutaneous tissue, unspecified: Secondary | ICD-10-CM

## 2021-10-14 DIAGNOSIS — E782 Mixed hyperlipidemia: Secondary | ICD-10-CM | POA: Diagnosis not present

## 2021-10-14 DIAGNOSIS — E1142 Type 2 diabetes mellitus with diabetic polyneuropathy: Secondary | ICD-10-CM | POA: Diagnosis not present

## 2021-10-14 DIAGNOSIS — L671 Variations in hair color: Secondary | ICD-10-CM | POA: Diagnosis not present

## 2021-10-14 DIAGNOSIS — N1832 Chronic kidney disease, stage 3b: Secondary | ICD-10-CM | POA: Diagnosis not present

## 2021-10-14 DIAGNOSIS — E1159 Type 2 diabetes mellitus with other circulatory complications: Secondary | ICD-10-CM | POA: Diagnosis not present

## 2021-10-14 DIAGNOSIS — E1122 Type 2 diabetes mellitus with diabetic chronic kidney disease: Secondary | ICD-10-CM | POA: Diagnosis not present

## 2021-10-14 DIAGNOSIS — N1831 Chronic kidney disease, stage 3a: Secondary | ICD-10-CM | POA: Diagnosis not present

## 2021-10-14 LAB — RENAL FUNCTION PANEL
Albumin: 4.1 g/dL (ref 3.5–5.2)
BUN: 31 mg/dL — ABNORMAL HIGH (ref 6–23)
CO2: 28 mEq/L (ref 19–32)
Calcium: 9.9 mg/dL (ref 8.4–10.5)
Chloride: 103 mEq/L (ref 96–112)
Creatinine, Ser: 1.44 mg/dL (ref 0.40–1.50)
GFR: 49.29 mL/min — ABNORMAL LOW (ref >60.00)
Glucose, Bld: 158 mg/dL — ABNORMAL HIGH (ref 70–99)
Phosphorus: 3.6 mg/dL (ref 2.3–4.6)
Potassium: 4.4 mEq/L (ref 3.5–5.1)
Sodium: 138 mEq/L (ref 135–145)

## 2021-10-14 LAB — POCT GLYCOSYLATED HEMOGLOBIN (HGB A1C): Hemoglobin A1C: 7.1 % — AB (ref 4.0–5.6)

## 2021-10-14 LAB — MICROALBUMIN / CREATININE URINE RATIO
Creatinine,U: 160.1 mg/dL
Microalb Creat Ratio: 4.4 mg/g (ref 0.0–30.0)
Microalb, Ur: 7.1 mg/dL — ABNORMAL HIGH (ref 0.0–1.9)

## 2021-10-14 LAB — TSH: TSH: 2 u[IU]/mL (ref 0.35–5.50)

## 2021-10-14 MED ORDER — PROBENECID 500 MG PO TABS: 250.0000 mg | ORAL_TABLET | ORAL | 3 refills | Status: DC

## 2021-10-14 NOTE — Assessment & Plan Note (Addendum)
Chronic, improved control now on trulicity 3mg  weekly.  Notes trouble affording with Medicare.  Foot exam today, will request latest diabetic eye exam.  Regularly seeing endocrinology - appreciate their assistance.

## 2021-10-14 NOTE — Assessment & Plan Note (Signed)
Discussed this. Check TSH. Anticipate benign cause.

## 2021-10-15 ENCOUNTER — Encounter: Payer: Self-pay | Admitting: Cardiology

## 2021-10-16 ENCOUNTER — Other Ambulatory Visit: Payer: Self-pay | Admitting: Cardiology

## 2021-10-16 MED ORDER — METOPROLOL TARTRATE 25 MG PO TABS: ORAL_TABLET | ORAL | 2 refills | Status: DC

## 2021-10-16 MED ORDER — ROSUVASTATIN CALCIUM 40 MG PO TABS: 40.0000 mg | ORAL_TABLET | Freq: Every day | ORAL | 2 refills | Status: DC

## 2021-10-16 NOTE — Telephone Encounter (Signed)
Called pt cell phone had to leave a VM to notify him that the requested medication was sent to the pharmacy.

## 2022-01-02 ENCOUNTER — Encounter (HOSPITAL_COMMUNITY): Payer: Self-pay | Admitting: Emergency Medicine

## 2022-01-02 ENCOUNTER — Inpatient Hospital Stay (HOSPITAL_COMMUNITY)
Admission: EM | Admit: 2022-01-02 | Discharge: 2022-01-04 | DRG: 682 | Disposition: A | Discharge status: Home / Self Care | Payer: Medicare HMO | Attending: Family Medicine | Admitting: Family Medicine

## 2022-01-02 ENCOUNTER — Emergency Department (HOSPITAL_COMMUNITY): Payer: Medicare HMO

## 2022-01-02 ENCOUNTER — Telehealth: Payer: Medicare HMO | Admitting: Physician Assistant

## 2022-01-02 ENCOUNTER — Other Ambulatory Visit: Payer: Self-pay

## 2022-01-02 DIAGNOSIS — M7989 Other specified soft tissue disorders: Secondary | ICD-10-CM | POA: Diagnosis present

## 2022-01-02 DIAGNOSIS — Z6832 Body mass index (BMI) 32.0-32.9, adult: Secondary | ICD-10-CM

## 2022-01-02 DIAGNOSIS — G4733 Obstructive sleep apnea (adult) (pediatric): Secondary | ICD-10-CM | POA: Diagnosis present

## 2022-01-02 DIAGNOSIS — Z79890 Hormone replacement therapy (postmenopausal): Secondary | ICD-10-CM

## 2022-01-02 DIAGNOSIS — Z8249 Family history of ischemic heart disease and other diseases of the circulatory system: Secondary | ICD-10-CM

## 2022-01-02 DIAGNOSIS — I5181 Takotsubo syndrome: Secondary | ICD-10-CM

## 2022-01-02 DIAGNOSIS — R0602 Shortness of breath: Secondary | ICD-10-CM

## 2022-01-02 DIAGNOSIS — E860 Dehydration: Secondary | ICD-10-CM | POA: Diagnosis present

## 2022-01-02 DIAGNOSIS — I739 Peripheral vascular disease, unspecified: Secondary | ICD-10-CM

## 2022-01-02 DIAGNOSIS — N179 Acute kidney failure, unspecified: Principal | ICD-10-CM | POA: Diagnosis present

## 2022-01-02 DIAGNOSIS — I252 Old myocardial infarction: Secondary | ICD-10-CM

## 2022-01-02 DIAGNOSIS — I2510 Atherosclerotic heart disease of native coronary artery without angina pectoris: Secondary | ICD-10-CM | POA: Diagnosis present

## 2022-01-02 DIAGNOSIS — E875 Hyperkalemia: Secondary | ICD-10-CM | POA: Diagnosis present

## 2022-01-02 DIAGNOSIS — E1110 Type 2 diabetes mellitus with ketoacidosis without coma: Secondary | ICD-10-CM | POA: Diagnosis present

## 2022-01-02 DIAGNOSIS — E1142 Type 2 diabetes mellitus with diabetic polyneuropathy: Secondary | ICD-10-CM | POA: Diagnosis present

## 2022-01-02 DIAGNOSIS — Z8679 Personal history of other diseases of the circulatory system: Secondary | ICD-10-CM

## 2022-01-02 DIAGNOSIS — Z809 Family history of malignant neoplasm, unspecified: Secondary | ICD-10-CM

## 2022-01-02 DIAGNOSIS — Z794 Long term (current) use of insulin: Secondary | ICD-10-CM

## 2022-01-02 DIAGNOSIS — Z7985 Long-term (current) use of injectable non-insulin antidiabetic drugs: Secondary | ICD-10-CM

## 2022-01-02 DIAGNOSIS — K30 Functional dyspepsia: Secondary | ICD-10-CM

## 2022-01-02 DIAGNOSIS — K5900 Constipation, unspecified: Secondary | ICD-10-CM | POA: Diagnosis present

## 2022-01-02 DIAGNOSIS — Z7982 Long term (current) use of aspirin: Secondary | ICD-10-CM

## 2022-01-02 DIAGNOSIS — R7989 Other specified abnormal findings of blood chemistry: Secondary | ICD-10-CM | POA: Diagnosis present

## 2022-01-02 DIAGNOSIS — R0609 Other forms of dyspnea: Secondary | ICD-10-CM | POA: Diagnosis present

## 2022-01-02 DIAGNOSIS — Z955 Presence of coronary angioplasty implant and graft: Secondary | ICD-10-CM

## 2022-01-02 DIAGNOSIS — Z86718 Personal history of other venous thrombosis and embolism: Secondary | ICD-10-CM

## 2022-01-02 DIAGNOSIS — Z8614 Personal history of Methicillin resistant Staphylococcus aureus infection: Secondary | ICD-10-CM

## 2022-01-02 DIAGNOSIS — Z79899 Other long term (current) drug therapy: Secondary | ICD-10-CM

## 2022-01-02 DIAGNOSIS — I10 Essential (primary) hypertension: Secondary | ICD-10-CM | POA: Diagnosis present

## 2022-01-02 DIAGNOSIS — Z20822 Contact with and (suspected) exposure to covid-19: Secondary | ICD-10-CM | POA: Diagnosis present

## 2022-01-02 DIAGNOSIS — E1122 Type 2 diabetes mellitus with diabetic chronic kidney disease: Secondary | ICD-10-CM | POA: Diagnosis present

## 2022-01-02 DIAGNOSIS — Z87891 Personal history of nicotine dependence: Secondary | ICD-10-CM

## 2022-01-02 DIAGNOSIS — E785 Hyperlipidemia, unspecified: Secondary | ICD-10-CM | POA: Diagnosis present

## 2022-01-02 DIAGNOSIS — E1169 Type 2 diabetes mellitus with other specified complication: Secondary | ICD-10-CM | POA: Diagnosis present

## 2022-01-02 DIAGNOSIS — Z6825 Body mass index (BMI) 25.0-25.9, adult: Secondary | ICD-10-CM | POA: Diagnosis present

## 2022-01-02 DIAGNOSIS — Z833 Family history of diabetes mellitus: Secondary | ICD-10-CM

## 2022-01-02 DIAGNOSIS — E669 Obesity, unspecified: Secondary | ICD-10-CM | POA: Diagnosis present

## 2022-01-02 LAB — CBC WITH DIFFERENTIAL/PLATELET
Abs Immature Granulocytes: 0.06 10*3/uL (ref 0.00–0.07)
Basophils Absolute: 0.1 10*3/uL (ref 0.0–0.1)
Basophils Relative: 0 %
Eosinophils Absolute: 0.2 10*3/uL (ref 0.0–0.5)
Eosinophils Relative: 1 %
HCT: 43.3 % (ref 39.0–52.0)
Hemoglobin: 14.9 g/dL (ref 13.0–17.0)
Immature Granulocytes: 1 %
Lymphocytes Relative: 10 %
Lymphs Abs: 1.2 10*3/uL (ref 0.7–4.0)
MCH: 30.8 pg (ref 26.0–34.0)
MCHC: 34.4 g/dL (ref 30.0–36.0)
MCV: 89.5 fL (ref 80.0–100.0)
Monocytes Absolute: 0.8 10*3/uL (ref 0.1–1.0)
Monocytes Relative: 7 %
Neutro Abs: 8.9 10*3/uL — ABNORMAL HIGH (ref 1.7–7.7)
Neutrophils Relative %: 81 %
Platelets: 130 10*3/uL — ABNORMAL LOW (ref 150–400)
RBC: 4.84 MIL/uL (ref 4.22–5.81)
RDW: 13.3 % (ref 11.5–15.5)
WBC: 11.2 10*3/uL — ABNORMAL HIGH (ref 4.0–10.5)
nRBC: 0 % (ref 0.0–0.2)

## 2022-01-02 LAB — COMPREHENSIVE METABOLIC PANEL
ALT: 31 U/L (ref 0–44)
AST: 33 U/L (ref 15–41)
Albumin: 4 g/dL (ref 3.5–5.0)
Alkaline Phosphatase: 40 U/L (ref 38–126)
Anion gap: 11 (ref 5–15)
BUN: 51 mg/dL — ABNORMAL HIGH (ref 8–23)
CO2: 17 mmol/L — ABNORMAL LOW (ref 22–32)
Calcium: 9.3 mg/dL (ref 8.9–10.3)
Chloride: 104 mmol/L (ref 98–111)
Creatinine, Ser: 2.44 mg/dL — ABNORMAL HIGH (ref 0.61–1.24)
GFR, Estimated: 28 mL/min — ABNORMAL LOW (ref >60)
Glucose, Bld: 242 mg/dL — ABNORMAL HIGH (ref 70–99)
Potassium: 6.3 mmol/L (ref 3.5–5.1)
Sodium: 132 mmol/L — ABNORMAL LOW (ref 135–145)
Total Bilirubin: 0.9 mg/dL (ref 0.3–1.2)
Total Protein: 7.1 g/dL (ref 6.5–8.1)

## 2022-01-02 LAB — TROPONIN I (HIGH SENSITIVITY): Troponin I (High Sensitivity): 8 ng/L (ref <18)

## 2022-01-02 LAB — D-DIMER, QUANTITATIVE: D-Dimer, Quant: 1.35 ug/mL-FEU — ABNORMAL HIGH (ref 0.00–0.50)

## 2022-01-02 MED ORDER — SODIUM ZIRCONIUM CYCLOSILICATE 10 G PO PACK
10.0000 g | PACK | Freq: Once | ORAL | Status: AC
  Administered 2022-01-02: 10 g via ORAL
  Filled 2022-01-02: qty 1

## 2022-01-02 MED ORDER — SODIUM CHLORIDE 0.9 % IV BOLUS
500.0000 mL | Freq: Once | INTRAVENOUS | Status: AC
  Administered 2022-01-02: 500 mL via INTRAVENOUS

## 2022-01-02 MED ORDER — ALBUTEROL SULFATE (2.5 MG/3ML) 0.083% IN NEBU
10.0000 mg | INHALATION_SOLUTION | Freq: Once | RESPIRATORY_TRACT | Status: AC
  Administered 2022-01-02: 10 mg via RESPIRATORY_TRACT
  Filled 2022-01-02: qty 12

## 2022-01-02 NOTE — ED Provider Triage Note (Cosign Needed Addendum)
Emergency Medicine Provider Triage Evaluation Note  Alan Morrison , a 71 y.o. male  was evaluated in triage.  Pt complains of pain in lower extremities for the past 3 weeks as well as chest pain and shortness of breath on exertion.  Patient reports he has a history of a DVT previously.  Not taking any blood thinners.  Denies any palpitations.  Denies any trauma to the legs.  Reports he has noticed some swelling to his left leg as well..  Review of Systems  Positive:  Negative:   Physical Exam  BP 116/66   Pulse 93   Temp 98 F (36.7 C) (Oral)   Resp 20   Ht '5\' 7"'$  (1.702 m)   Wt 94.3 kg   SpO2 98%   BMI 32.58 kg/m  Gen:   Awake, no distress   Resp:  Normal effort  MSK:   Moves extremities without difficulty  Other:  Compartments are soft.  Palpable pulses.  Brawny skin noted to the left greater than right.  Swelling noted to the left greater than right.  Medical Decision Making  Medically screening exam initiated at 8:15 PM.  Appropriate orders placed.  Alan Morrison was informed that the remainder of the evaluation will be completed by another provider, this initial triage assessment does not replace that evaluation, and the importance of remaining in the ED until their evaluation is complete.  We will order ultrasound DVT study although they are currently not here.  Will order D-dimer instead as well as basic labs and cardiac work-up.  ADDENDUM: Lab called critical potassium 6.3.  Charge nurse alerted.   Alan Puller, PA-C 01/02/22 2016    Alan Puller, PA-C 01/02/22 2134

## 2022-01-02 NOTE — ED Provider Notes (Signed)
[place] EMERGENCY DEPARTMENT Provider Note   CSN: [id number] Arrival date & time: 01/02/22  1857     History {Add pertinent medical, surgical, social history, OB history to HPI:1} Chief Complaint  Patient presents with   Shortness of Breath    Alan Morrison is a 71 y.o. male.  The history is provided by the patient and medical records.  Shortness of Breath Alan Morrison is a 71 y.o. male who presents to the Emergency Department complaining of *** Calf pain with exertion over the last few days, worsening. DOE - for 3-4 weeks.  No PND.    No fever, chest pain, AP.  Had bad reflux last weekend.  No N/V.  Has constipation.   Urinating well.  No change in urine quality. Has LLE edema.  Has cough, nonproductive for 1 week.   Hx/o DM, CAD, OSA, HPL, DVT (three years ago, not currently on anticoagulation).  Currently on plavix.  Feels like prior DVT.    Started trulicty at the beginning of the year, otherwise no medication changes.      Home Medications Prior to Admission medications   Medication Sig Start Date End Date Taking? Authorizing Provider  acetaminophen (TYLENOL) 500 MG tablet Take 1,000 mg by mouth 2 (two) times daily as needed for headache (pain).    [provider]  aspirin EC 81 MG tablet Take 81 mg by mouth daily with supper. Swallow whole.    [provider]  Calcium Carbonate Antacid (TUMS PO) Take 1 tablet by mouth daily as needed (acid reflux/indigestion).    [provider]  Cholecalciferol (VITAMIN D) 2000 units tablet Take 2,000 Units by mouth daily with breakfast.     [provider]  finasteride (PROSCAR) 5 MG tablet TAKE 1 TABLET BY MOUTH EVERY DAY 06/04/21   [name], MD  Insulin Glargine [facility]) 100 UNIT/ML Inject 75 Units into the skin at bedtime. 06/10/20   [name], MD  Insulin Pen Needle (B-D ULTRAFINE III SHORT PEN) 31G X 8 MM MISC 1 Device by Other route 4 (four)  times daily. USE AS DIRECTED DAILY 06/09/18   Renato Shin, MD  Iron, Ferrous Sulfate, 325 (65 Fe) MG TABS Take 325 mg by mouth every other day. 06/16/21   [name], MD  losartan (COZAAR) 25 MG tablet TAKE 1 TABLET (25 MG TOTAL) BY MOUTH DAILY. 09/25/21   [place], Peter M, MD  metFORMIN (GLUCOPHAGE) 1000 MG tablet TAKE 1 TABLET BY MOUTH EVERY DAY AT [facility] 06/04/21   [name], MD  metoprolol tartrate (LOPRESSOR) 25 MG tablet TAKE 0.5 TABLETS BY MOUTH 2 TIMES DAILY. 10/16/21   [place], Peter M, MD  Multiple Vitamin (MULTIVITAMIN WITH MINERALS) TABS tablet Take 1 tablet by mouth daily with breakfast.     [provider]  nitroGLYCERIN (NITROSTAT) 0.4 MG SL tablet PLACE 1 TABLET UNDER THE TONGUE EVERY 5 MINUTES AS NEEDED FOR CHEST PAIN 05/27/21   [place], Peter M, MD  omeprazole (PRILOSEC) 40 MG capsule Take 1 capsule (40 mg total) by mouth daily as needed. For 3 weeks then as needed 10/10/21   [name], MD  [facility] ULTRA test strip USE TO CHECK SUGARS DAILY  AS DIRECTED 03/13/19   [name], MD  probenecid (BENEMID) 500 MG tablet Take 0.5 tablets (250 mg total) by mouth every other day. 10/14/21   [name], MD  rosuvastatin (CRESTOR) 40 MG tablet Take 1 tablet (40 mg total) by mouth daily with supper. 10/16/21  [place], Peter M, MD  tamsulosin (FLOMAX) 0.4 MG CAPS capsule Take 1 capsule (0.4 mg total) by mouth daily. 06/16/21   [name], MD  TRULICITY 5.00 XF/8.1WE SOPN SMARTSIG:0.5 Milliliter(s) SUB-Q Once a Week 05/26/21   [provider]      Allergies    Patient has no known allergies.    Review of Systems   Review of Systems  Respiratory:  Positive for shortness of breath.   All other systems reviewed and are negative.   Physical Exam Updated Vital Signs BP 106/75   Pulse 92   Temp 98 F (36.7 C) (Oral)   Resp 18   Ht '5\' 7"'$  (1.702 m)   Wt 94.3 kg   SpO2 96%   BMI 32.58 kg/m  Physical Exam Vitals and nursing note  reviewed.  Constitutional:      Appearance: He is well-developed.  HENT:     Head: Normocephalic and atraumatic.  Cardiovascular:     Rate and Rhythm: Normal rate and regular rhythm.     Heart sounds: No murmur heard. Pulmonary:     Effort: Pulmonary effort is normal. No respiratory distress.     Breath sounds: Normal breath sounds.  Abdominal:     Palpations: Abdomen is soft.     Tenderness: There is no abdominal tenderness. There is no guarding or rebound.  Musculoskeletal:        General: No tenderness.     Comments: Trace pitting edema to BLE, left slightly greater than right. 2+ DP pulses bilaterally  Skin:    General: Skin is warm and dry.  Neurological:     Mental Status: He is alert and oriented to person, place, and time.  Psychiatric:        Behavior: Behavior normal.     ED Results / Procedures / Treatments   Labs (all labs ordered are listed, but only abnormal results are displayed) Labs Reviewed  D-DIMER, QUANTITATIVE - Abnormal; Notable for the following components:      Result Value   D-Dimer, Quant 1.35 (*)    All other components within normal limits  CBC WITH DIFFERENTIAL/PLATELET - Abnormal; Notable for the following components:   WBC 11.2 (*)    Platelets 130 (*)    Neutro Abs 8.9 (*)    All other components within normal limits  COMPREHENSIVE METABOLIC PANEL - Abnormal; Notable for the following components:   Sodium 132 (*)    Potassium 6.3 (*)    CO2 17 (*)    Glucose, Bld 242 (*)    BUN 51 (*)    Creatinine, Ser 2.44 (*)    GFR, Estimated 28 (*)    All other components within normal limits  RESP PANEL BY RT-PCR (FLU A&B, COVID) ARPGX2  BRAIN NATRIURETIC PEPTIDE  TROPONIN I (HIGH SENSITIVITY)  TROPONIN I (HIGH SENSITIVITY)    EKG EKG Interpretation  Date/Time:  Friday January 02 2022 19:50:58 EDT Ventricular Rate:  93 PR Interval:  192 QRS Duration: 104 QT Interval:  330 QTC Calculation: 410 R Axis:   -7 Text  Interpretation: Normal sinus rhythm Inferior infarct , age undetermined Anterior infarct , age undetermined Abnormal ECG When compared with ECG of 13-Nov-2019 06:34, PREVIOUS ECG IS PRESENT agree. no STEMI Confirmed by Charlesetta Shanks [phone]) on 01/02/2022 10:59:15 PM  Radiology DG Chest 2 View  Result Date: 01/02/2022 CLINICAL DATA:  Shortness of breath EXAM: CHEST - 2 VIEW COMPARISON:  06/16/2021 FINDINGS: The heart size and mediastinal contours are within normal  limits. Both lungs are clear. The visualized skeletal structures are unremarkable. IMPRESSION: No active cardiopulmonary disease. Electronically Signed   By: Inez Catalina M.D.   On: 01/02/2022 20:40    Procedures Procedures  {Document cardiac monitor, telemetry assessment procedure when appropriate:1}  Medications Ordered in ED Medications  sodium chloride 0.9 % bolus 500 mL (has no administration in time range)  sodium zirconium cyclosilicate (LOKELMA) packet 10 g (has no administration in time range)  albuterol (PROVENTIL) (2.5 MG/3ML) 0.083% nebulizer solution 10 mg (has no administration in time range)    ED Course/ Medical Decision Making/ A&P                           Medical Decision Making  ***  {Document critical care time when appropriate:1} {Document review of labs and clinical decision tools ie heart score, Chads2Vasc2 etc:1}  {Document your independent review of radiology images, and any outside records:1} {Document your discussion with family members, caretakers, and with consultants:1} {Document social determinants of health affecting pt's care:1} {Document your decision making why or why not admission, treatments were needed:1} Final Clinical Impression(s) / ED Diagnoses Final diagnoses:  None    Rx / DC Orders ED Discharge Orders     None

## 2022-01-02 NOTE — ED Triage Notes (Signed)
Pt reports SOB with bilateral calf pain x 2 weeks, pt reports hx of blood clots, not on thinners other than a daily '81mg'$  asa; pt further endorses acid reflux, has not taken anything for it; did a virtual visit pta and was instructed to (3) '81mg'$  asa

## 2022-01-02 NOTE — ED Notes (Signed)
EDP at bedside  

## 2022-01-02 NOTE — Progress Notes (Signed)
Having pain in both calves with walking and now with shortness of breath. Feels like he has bad indigestion, but denies chest pain. Has had blood clots in the past and STEMI. Advised to chew 3-'81mg'$  ASA and seek immediate care.   Recommended immediate ER evaluation or call 911 if he is more than 20 minutes drive from an ER.

## 2022-01-02 NOTE — ED Provider Notes (Signed)
Patient assessed when he arrived back to the room.  He is alert.  No acute distress.  No respiratory distress at rest.  Patient reports he has had several weeks of increasing shortness of breath.  No chest pain associated.  He has noted some swelling in his legs.  He was concerned due to history of DVT.  He is not currently on anticoagulants.  Lungs are clear to auscultation.  Heart is regular.  1+ pitting edema bilaterally.  Calves nontender.   Charlesetta Shanks, MD 01/02/22 2257

## 2022-01-03 ENCOUNTER — Other Ambulatory Visit (HOSPITAL_COMMUNITY): Payer: Medicare HMO

## 2022-01-03 ENCOUNTER — Inpatient Hospital Stay (HOSPITAL_COMMUNITY): Payer: Medicare HMO

## 2022-01-03 ENCOUNTER — Encounter (HOSPITAL_COMMUNITY): Payer: Self-pay | Admitting: Internal Medicine

## 2022-01-03 ENCOUNTER — Inpatient Hospital Stay (HOSPITAL_BASED_OUTPATIENT_CLINIC_OR_DEPARTMENT_OTHER): Payer: Medicare HMO

## 2022-01-03 DIAGNOSIS — I10 Essential (primary) hypertension: Secondary | ICD-10-CM | POA: Diagnosis not present

## 2022-01-03 DIAGNOSIS — Z87891 Personal history of nicotine dependence: Secondary | ICD-10-CM | POA: Diagnosis not present

## 2022-01-03 DIAGNOSIS — E669 Obesity, unspecified: Secondary | ICD-10-CM | POA: Diagnosis not present

## 2022-01-03 DIAGNOSIS — N179 Acute kidney failure, unspecified: Secondary | ICD-10-CM | POA: Diagnosis not present

## 2022-01-03 DIAGNOSIS — Z86718 Personal history of other venous thrombosis and embolism: Secondary | ICD-10-CM

## 2022-01-03 DIAGNOSIS — Z833 Family history of diabetes mellitus: Secondary | ICD-10-CM | POA: Diagnosis not present

## 2022-01-03 DIAGNOSIS — E785 Hyperlipidemia, unspecified: Secondary | ICD-10-CM | POA: Diagnosis not present

## 2022-01-03 DIAGNOSIS — Z809 Family history of malignant neoplasm, unspecified: Secondary | ICD-10-CM | POA: Diagnosis not present

## 2022-01-03 DIAGNOSIS — R0609 Other forms of dyspnea: Secondary | ICD-10-CM

## 2022-01-03 DIAGNOSIS — E875 Hyperkalemia: Secondary | ICD-10-CM | POA: Diagnosis not present

## 2022-01-03 DIAGNOSIS — Z79890 Hormone replacement therapy (postmenopausal): Secondary | ICD-10-CM | POA: Diagnosis not present

## 2022-01-03 DIAGNOSIS — Z8614 Personal history of Methicillin resistant Staphylococcus aureus infection: Secondary | ICD-10-CM | POA: Diagnosis not present

## 2022-01-03 DIAGNOSIS — Z794 Long term (current) use of insulin: Secondary | ICD-10-CM | POA: Diagnosis not present

## 2022-01-03 DIAGNOSIS — I252 Old myocardial infarction: Secondary | ICD-10-CM | POA: Diagnosis not present

## 2022-01-03 DIAGNOSIS — Z955 Presence of coronary angioplasty implant and graft: Secondary | ICD-10-CM | POA: Diagnosis not present

## 2022-01-03 DIAGNOSIS — Z20822 Contact with and (suspected) exposure to covid-19: Secondary | ICD-10-CM | POA: Diagnosis not present

## 2022-01-03 DIAGNOSIS — E860 Dehydration: Secondary | ICD-10-CM | POA: Diagnosis not present

## 2022-01-03 DIAGNOSIS — M7989 Other specified soft tissue disorders: Secondary | ICD-10-CM

## 2022-01-03 DIAGNOSIS — M79609 Pain in unspecified limb: Secondary | ICD-10-CM

## 2022-01-03 DIAGNOSIS — Z8249 Family history of ischemic heart disease and other diseases of the circulatory system: Secondary | ICD-10-CM | POA: Diagnosis not present

## 2022-01-03 DIAGNOSIS — R7989 Other specified abnormal findings of blood chemistry: Secondary | ICD-10-CM | POA: Diagnosis not present

## 2022-01-03 DIAGNOSIS — Z7982 Long term (current) use of aspirin: Secondary | ICD-10-CM | POA: Diagnosis not present

## 2022-01-03 DIAGNOSIS — E1142 Type 2 diabetes mellitus with diabetic polyneuropathy: Secondary | ICD-10-CM

## 2022-01-03 DIAGNOSIS — E1110 Type 2 diabetes mellitus with ketoacidosis without coma: Secondary | ICD-10-CM | POA: Diagnosis not present

## 2022-01-03 DIAGNOSIS — R0602 Shortness of breath: Secondary | ICD-10-CM | POA: Diagnosis not present

## 2022-01-03 DIAGNOSIS — E1169 Type 2 diabetes mellitus with other specified complication: Secondary | ICD-10-CM | POA: Diagnosis not present

## 2022-01-03 DIAGNOSIS — I2510 Atherosclerotic heart disease of native coronary artery without angina pectoris: Secondary | ICD-10-CM | POA: Diagnosis not present

## 2022-01-03 LAB — I-STAT VENOUS BLOOD GAS, ED
Acid-base deficit: 8 mmol/L — ABNORMAL HIGH (ref 0.0–2.0)
Bicarbonate: 15.4 mmol/L — ABNORMAL LOW (ref 20.0–28.0)
Calcium, Ion: 1.05 mmol/L — ABNORMAL LOW (ref 1.15–1.40)
HCT: 36 % — ABNORMAL LOW (ref 39.0–52.0)
Hemoglobin: 12.2 g/dL — ABNORMAL LOW (ref 13.0–17.0)
O2 Saturation: 99 %
Potassium: 4.2 mmol/L (ref 3.5–5.1)
Sodium: 132 mmol/L — ABNORMAL LOW (ref 135–145)
TCO2: 16 mmol/L — ABNORMAL LOW (ref 22–32)
pCO2, Ven: 25.7 mmHg — ABNORMAL LOW (ref 44–60)
pH, Ven: 7.386 (ref 7.25–7.43)
pO2, Ven: 142 mmHg — ABNORMAL HIGH (ref 32–45)

## 2022-01-03 LAB — COMPREHENSIVE METABOLIC PANEL
ALT: 25 U/L (ref 0–44)
AST: 27 U/L (ref 15–41)
Albumin: 3.2 g/dL — ABNORMAL LOW (ref 3.5–5.0)
Alkaline Phosphatase: 32 U/L — ABNORMAL LOW (ref 38–126)
Anion gap: 16 — ABNORMAL HIGH (ref 5–15)
BUN: 53 mg/dL — ABNORMAL HIGH (ref 8–23)
CO2: 14 mmol/L — ABNORMAL LOW (ref 22–32)
Calcium: 8.7 mg/dL — ABNORMAL LOW (ref 8.9–10.3)
Chloride: 102 mmol/L (ref 98–111)
Creatinine, Ser: 2.4 mg/dL — ABNORMAL HIGH (ref 0.61–1.24)
GFR, Estimated: 28 mL/min — ABNORMAL LOW (ref >60)
Glucose, Bld: 378 mg/dL — ABNORMAL HIGH (ref 70–99)
Potassium: 4.2 mmol/L (ref 3.5–5.1)
Sodium: 132 mmol/L — ABNORMAL LOW (ref 135–145)
Total Bilirubin: 0.7 mg/dL (ref 0.3–1.2)
Total Protein: 5.8 g/dL — ABNORMAL LOW (ref 6.5–8.1)

## 2022-01-03 LAB — CBG MONITORING, ED
Glucose-Capillary: 336 mg/dL — ABNORMAL HIGH (ref 70–99)
Glucose-Capillary: 359 mg/dL — ABNORMAL HIGH (ref 70–99)
Glucose-Capillary: 389 mg/dL — ABNORMAL HIGH (ref 70–99)
Glucose-Capillary: 337 mg/dL — ABNORMAL HIGH (ref 70–99)
Glucose-Capillary: 263 mg/dL — ABNORMAL HIGH (ref 70–99)
Glucose-Capillary: 178 mg/dL — ABNORMAL HIGH (ref 70–99)
Glucose-Capillary: 159 mg/dL — ABNORMAL HIGH (ref 70–99)
Glucose-Capillary: 143 mg/dL — ABNORMAL HIGH (ref 70–99)
Glucose-Capillary: 112 mg/dL — ABNORMAL HIGH (ref 70–99)

## 2022-01-03 LAB — URINALYSIS, ROUTINE W REFLEX MICROSCOPIC
Bilirubin Urine: NEGATIVE
Glucose, UA: NEGATIVE mg/dL
Ketones, ur: NEGATIVE mg/dL
Leukocytes,Ua: NEGATIVE
Nitrite: NEGATIVE
Protein, ur: 30 mg/dL — AB
RBC / HPF: >50 RBC/hpf — ABNORMAL HIGH (ref 0–5)
Specific Gravity, Urine: 1.025 (ref 1.005–1.030)
pH: 5 (ref 5.0–8.0)

## 2022-01-03 LAB — MAGNESIUM: Magnesium: 1.2 mg/dL — ABNORMAL LOW (ref 1.7–2.4)

## 2022-01-03 LAB — CBC WITH DIFFERENTIAL/PLATELET
Abs Immature Granulocytes: 0.04 10*3/uL (ref 0.00–0.07)
Basophils Absolute: 0.1 10*3/uL (ref 0.0–0.1)
Basophils Relative: 1 %
Eosinophils Absolute: 0.2 10*3/uL (ref 0.0–0.5)
Eosinophils Relative: 2 %
HCT: 36.7 % — ABNORMAL LOW (ref 39.0–52.0)
Hemoglobin: 12.6 g/dL — ABNORMAL LOW (ref 13.0–17.0)
Immature Granulocytes: 0 %
Lymphocytes Relative: 16 %
Lymphs Abs: 1.6 10*3/uL (ref 0.7–4.0)
MCH: 30.7 pg (ref 26.0–34.0)
MCHC: 34.3 g/dL (ref 30.0–36.0)
MCV: 89.3 fL (ref 80.0–100.0)
Monocytes Absolute: 0.9 10*3/uL (ref 0.1–1.0)
Monocytes Relative: 9 %
Neutro Abs: 6.8 10*3/uL (ref 1.7–7.7)
Neutrophils Relative %: 72 %
Platelets: 111 10*3/uL — ABNORMAL LOW (ref 150–400)
RBC: 4.11 MIL/uL — ABNORMAL LOW (ref 4.22–5.81)
RDW: 13.3 % (ref 11.5–15.5)
WBC: 9.6 10*3/uL (ref 4.0–10.5)
nRBC: 0 % (ref 0.0–0.2)

## 2022-01-03 LAB — BASIC METABOLIC PANEL
Anion gap: 19 — ABNORMAL HIGH (ref 5–15)
Anion gap: 12 (ref 5–15)
Anion gap: 11 (ref 5–15)
BUN: 53 mg/dL — ABNORMAL HIGH (ref 8–23)
BUN: 49 mg/dL — ABNORMAL HIGH (ref 8–23)
BUN: 43 mg/dL — ABNORMAL HIGH (ref 8–23)
CO2: 12 mmol/L — ABNORMAL LOW (ref 22–32)
CO2: 18 mmol/L — ABNORMAL LOW (ref 22–32)
CO2: 20 mmol/L — ABNORMAL LOW (ref 22–32)
Calcium: 8.7 mg/dL — ABNORMAL LOW (ref 8.9–10.3)
Calcium: 8.6 mg/dL — ABNORMAL LOW (ref 8.9–10.3)
Calcium: 8.6 mg/dL — ABNORMAL LOW (ref 8.9–10.3)
Chloride: 102 mmol/L (ref 98–111)
Chloride: 105 mmol/L (ref 98–111)
Chloride: 106 mmol/L (ref 98–111)
Creatinine, Ser: 2.47 mg/dL — ABNORMAL HIGH (ref 0.61–1.24)
Creatinine, Ser: 2.17 mg/dL — ABNORMAL HIGH (ref 0.61–1.24)
Creatinine, Ser: 1.88 mg/dL — ABNORMAL HIGH (ref 0.61–1.24)
GFR, Estimated: 27 mL/min — ABNORMAL LOW (ref >60)
GFR, Estimated: 32 mL/min — ABNORMAL LOW (ref >60)
GFR, Estimated: 38 mL/min — ABNORMAL LOW (ref >60)
Glucose, Bld: 322 mg/dL — ABNORMAL HIGH (ref 70–99)
Glucose, Bld: 173 mg/dL — ABNORMAL HIGH (ref 70–99)
Glucose, Bld: 107 mg/dL — ABNORMAL HIGH (ref 70–99)
Potassium: 3.9 mmol/L (ref 3.5–5.1)
Potassium: 3.6 mmol/L (ref 3.5–5.1)
Potassium: 4.1 mmol/L (ref 3.5–5.1)
Sodium: 133 mmol/L — ABNORMAL LOW (ref 135–145)
Sodium: 135 mmol/L (ref 135–145)
Sodium: 137 mmol/L (ref 135–145)

## 2022-01-03 LAB — RESP PANEL BY RT-PCR (FLU A&B, COVID) ARPGX2
Influenza A by PCR: NEGATIVE
Influenza B by PCR: NEGATIVE
SARS Coronavirus 2 by RT PCR: NEGATIVE

## 2022-01-03 LAB — TROPONIN I (HIGH SENSITIVITY): Troponin I (High Sensitivity): 9 ng/L (ref <18)

## 2022-01-03 LAB — BETA-HYDROXYBUTYRIC ACID
Beta-Hydroxybutyric Acid: 0.26 mmol/L (ref 0.05–0.27)
Beta-Hydroxybutyric Acid: 0.13 mmol/L (ref 0.05–0.27)
Beta-Hydroxybutyric Acid: 0.18 mmol/L (ref 0.05–0.27)

## 2022-01-03 LAB — BRAIN NATRIURETIC PEPTIDE: B Natriuretic Peptide: 16.8 pg/mL (ref 0.0–100.0)

## 2022-01-03 LAB — HIV ANTIBODY (ROUTINE TESTING W REFLEX): HIV Screen 4th Generation wRfx: NONREACTIVE

## 2022-01-03 MED ORDER — HEPARIN (PORCINE) 25000 UT/250ML-% IV SOLN
1400.0000 [IU]/h | INTRAVENOUS | Status: DC
  Administered 2022-01-03: 1400 [IU]/h via INTRAVENOUS
  Filled 2022-01-03: qty 250

## 2022-01-03 MED ORDER — INSULIN ASPART 100 UNIT/ML IJ SOLN
0.0000 [IU] | Freq: Every day | INTRAMUSCULAR | Status: DC
  Administered 2022-01-03: 5 [IU] via SUBCUTANEOUS

## 2022-01-03 MED ORDER — ASPIRIN 81 MG PO TBEC
81.0000 mg | DELAYED_RELEASE_TABLET | Freq: Every day | ORAL | Status: DC
  Administered 2022-01-03 – 2022-01-04 (×2): 81 mg via ORAL
  Filled 2022-01-03 (×2): qty 1

## 2022-01-03 MED ORDER — ONDANSETRON HCL 4 MG PO TABS: 4.0000 mg | ORAL_TABLET | Freq: Four times a day (QID) | ORAL | Status: DC | PRN

## 2022-01-03 MED ORDER — INSULIN ASPART 100 UNIT/ML IJ SOLN
0.0000 [IU] | Freq: Every day | INTRAMUSCULAR | Status: DC
  Administered 2022-01-03: 4 [IU] via SUBCUTANEOUS

## 2022-01-03 MED ORDER — INSULIN ASPART 100 UNIT/ML IJ SOLN
0.0000 [IU] | Freq: Three times a day (TID) | INTRAMUSCULAR | Status: DC
  Administered 2022-01-04 (×2): 3 [IU] via SUBCUTANEOUS

## 2022-01-03 MED ORDER — ACETAMINOPHEN 325 MG PO TABS: 650.0000 mg | ORAL_TABLET | Freq: Four times a day (QID) | ORAL | Status: DC | PRN

## 2022-01-03 MED ORDER — HYDROCODONE-ACETAMINOPHEN 5-325 MG PO TABS: 1.0000 | ORAL_TABLET | ORAL | Status: DC | PRN

## 2022-01-03 MED ORDER — SODIUM CHLORIDE 0.9 % IV SOLN: INTRAVENOUS | Status: AC

## 2022-01-03 MED ORDER — DEXTROSE-NACL 5-0.9 % IV SOLN: INTRAVENOUS | Status: DC

## 2022-01-03 MED ORDER — INSULIN ASPART 100 UNIT/ML IJ SOLN: 0.0000 [IU] | Freq: Three times a day (TID) | INTRAMUSCULAR | Status: DC

## 2022-01-03 MED ORDER — METFORMIN HCL 500 MG PO TABS: 1000.0000 mg | ORAL_TABLET | Freq: Every day | ORAL | Status: DC

## 2022-01-03 MED ORDER — FINASTERIDE 5 MG PO TABS
5.0000 mg | ORAL_TABLET | Freq: Every day | ORAL | Status: DC
  Administered 2022-01-03 – 2022-01-04 (×2): 5 mg via ORAL
  Filled 2022-01-03 (×2): qty 1

## 2022-01-03 MED ORDER — METOPROLOL TARTRATE 12.5 MG HALF TABLET
12.5000 mg | ORAL_TABLET | Freq: Two times a day (BID) | ORAL | Status: DC
  Administered 2022-01-03 – 2022-01-04 (×3): 12.5 mg via ORAL
  Filled 2022-01-03 (×3): qty 1

## 2022-01-03 MED ORDER — ACETAMINOPHEN 650 MG RE SUPP: 650.0000 mg | Freq: Four times a day (QID) | RECTAL | Status: DC | PRN

## 2022-01-03 MED ORDER — INSULIN REGULAR(HUMAN) IN NACL 100-0.9 UT/100ML-% IV SOLN
INTRAVENOUS | Status: DC
  Administered 2022-01-03: 11.5 [IU]/h via INTRAVENOUS
  Filled 2022-01-03: qty 100

## 2022-01-03 MED ORDER — ROSUVASTATIN CALCIUM 20 MG PO TABS
40.0000 mg | ORAL_TABLET | Freq: Every day | ORAL | Status: DC
  Administered 2022-01-03 – 2022-01-04 (×2): 40 mg via ORAL
  Filled 2022-01-03 (×2): qty 2

## 2022-01-03 MED ORDER — INSULIN GLARGINE-YFGN 100 UNIT/ML ~~LOC~~ SOLN
50.0000 [IU] | Freq: Every day | SUBCUTANEOUS | Status: DC
  Administered 2022-01-03 – 2022-01-04 (×2): 50 [IU] via SUBCUTANEOUS
  Filled 2022-01-03 (×2): qty 0.5

## 2022-01-03 MED ORDER — DEXTROSE IN LACTATED RINGERS 5 % IV SOLN: INTRAVENOUS | Status: DC

## 2022-01-03 MED ORDER — HEPARIN BOLUS VIA INFUSION
5000.0000 [IU] | Freq: Once | INTRAVENOUS | Status: AC
  Administered 2022-01-03: 5000 [IU] via INTRAVENOUS
  Filled 2022-01-03: qty 5000

## 2022-01-03 MED ORDER — LACTATED RINGERS IV SOLN: INTRAVENOUS | Status: DC

## 2022-01-03 MED ORDER — TAMSULOSIN HCL 0.4 MG PO CAPS
0.4000 mg | ORAL_CAPSULE | Freq: Every day | ORAL | Status: DC
  Administered 2022-01-03 – 2022-01-04 (×2): 0.4 mg via ORAL
  Filled 2022-01-03 (×2): qty 1

## 2022-01-03 MED ORDER — ONDANSETRON HCL 4 MG/2ML IJ SOLN: 4.0000 mg | Freq: Four times a day (QID) | INTRAMUSCULAR | Status: DC | PRN

## 2022-01-03 MED ORDER — DEXTROSE 50 % IV SOLN: 0.0000 mL | INTRAVENOUS | Status: DC | PRN

## 2022-01-03 MED ORDER — INSULIN GLARGINE-YFGN 100 UNIT/ML ~~LOC~~ SOLN
50.0000 [IU] | Freq: Every day | SUBCUTANEOUS | Status: DC
  Administered 2022-01-03: 50 [IU] via SUBCUTANEOUS
  Filled 2022-01-03: qty 0.5

## 2022-01-03 NOTE — ED Notes (Signed)
Dr Bridgett Larsson @ bedside

## 2022-01-03 NOTE — Assessment & Plan Note (Signed)
Admit to med/tele bed. Observation. Unclear the etiology. Pt does not take any diuretics. Denies any use of NSAIDs(ibuprofen, naproxen). He does take a ASA 81 mg daily. Will check bladder scan/post void residual. Continue with IVF NS @ 75 ml/hr. Repeat CMP in AM.

## 2022-01-03 NOTE — Progress Notes (Signed)
LLE venous duplex has been completed.  Preliminary results given to [facility], RN.   Results can be found under chart review under CV PROC. 01/03/2022 12:43 PM Wanona Stare RVT, RDMS

## 2022-01-03 NOTE — Assessment & Plan Note (Signed)
Will check echo. Prior RCA stenosis in 2021 requiring PCI. Also had moderate 50% disease in LAD and PDA. May need outpatient stress test.

## 2022-01-03 NOTE — Assessment & Plan Note (Signed)
Treated with po lokelma. Continue with IVF. Hold ARB. Repeat CMP in AM.

## 2022-01-03 NOTE — Progress Notes (Signed)
   Repeat BMP shows improved hyperkalemia. But serum CO2 now 12, with Anion gap of 19.  Suspect DKA. Will check Beta hydroxy acid. Start DKA protocol.     Latest Ref Rng & Units 01/03/2022    1:39 AM 01/02/2022    8:19 PM 10/14/2021    9:38 AM  BMP  Glucose 70 - 99 mg/dL 322  242  158   BUN 8 - 23 mg/dL 53  51  31   Creatinine 0.61 - 1.24 mg/dL 2.47  2.44  1.44   Sodium 135 - 145 mmol/L 133  132  138   Potassium 3.5 - 5.1 mmol/L 3.9  6.3  4.4   Chloride 98 - 111 mmol/L 102  104  103   CO2 22 - 32 mmol/L '12  17  28   '$ Calcium 8.9 - 10.3 mg/dL 8.7  9.3  9.9      Kristopher Oppenheim, DO [facility]

## 2022-01-03 NOTE — Subjective & Objective (Addendum)
CC: left leg swelling. Concern for DVT HPI: 71 year old Caucasian male history of type 2 diabetes, coronary disease status post RCA stent 2001, hypertension, prior history of DVT in the left leg, hyperlipidemia, presents to the ER today with a 3-day history of left calf pain.  He has also had a 4-week history of worsening dyspnea on exertion.  Patient states his calf started hurting about 3 days ago.  No acute injuries.  Denies any stronger than normal exertion.  He has had about 4 weeks of increasing dyspnea exertion.  States that even walking to his mailbox, he feels very winded.  Denies any chest pain.  On arrival temp 98 heart rate 93 blood pressure 116/66 satting 98% on room air.  Labs showed BNP normal at 16.8, D-dimer is elevated at 1.35  Sodium 132, potassium 6.3, BUN of 51, creatinine 2.4  Patient does eat oranges and bananas almost on a daily basis.  White count 11.2, hemoglobin 14.9, platelets of 130  Chest x-ray showed no acute cardiopulmonary disease.  EKG personally interpreted by myself shows normal sinus rhythm with an old inferior infarct.  [facility] contacted for admission.

## 2022-01-03 NOTE — Inpatient Diabetes Management (Signed)
Inpatient Diabetes Program Recommendations  AACE/ADA: New Consensus Statement on Inpatient Glycemic Control (2015)  Target Ranges:  Prepandial:   less than 140 mg/dL      Peak postprandial:   less than 180 mg/dL (1-2 hours)      Critically ill patients:  140 - 180 mg/dL   Lab Results  Component Value Date   GLUCAP 143 (H) 01/03/2022   HGBA1C 7.1 (A) 10/14/2021    Latest Reference Range & Units 01/02/22 20:19  Potassium 3.5 - 5.1 mmol/L 6.3 (HH)  Chloride 98 - 111 mmol/L 104  CO2 22 - 32 mmol/L 17 (L)  Glucose 70 - 99 mg/dL 242 (H)  BUN 8 - 23 mg/dL 51 (H)  Creatinine 0.61 - 1.24 mg/dL 2.44 (H)  Calcium 8.9 - 10.3 mg/dL 9.3  Anion gap 5 - 15  11    Latest Reference Range & Units 01/02/22 20:19  GFR, Estimated >60 mL/min 28 (L)  (L): Data is abnormally low(HH): Data is critically high (L): Data is abnormally low (H): Data is abnormally high  Review of Glycemic Control  Diabetes history: DM2 Outpatient Diabetes medications: Lantus 100 units q hs, Metformin 1 gm qd, Trulicity 8.12 q week Current orders for Inpatient glycemic control: IV insulin to transition to Semglee 50 units, Novolog 0-15 units tid, 0-5 units hs  Inpatient Diabetes Program Recommendations:   -Add A1c to orders if not drawn Will follow during hospitalization. Agree with current orders.  Thank you, Nani Gasser. Arriyanna Mersch, RN, MSN, CDE  Diabetes Coordinator Inpatient Glycemic Control Team Team Pager [phone] (8am-5pm) 01/03/2022 11:42 AM

## 2022-01-03 NOTE — H&P (Signed)
History and Physical    Alan Morrison VZC:[id number] DOB: 1950/06/19 DOA: 01/02/2022  DOS: the patient was seen and examined on 01/02/2022  PCP: [name], MD   Patient coming from: Home  I have personally briefly reviewed patient's old medical records in [place]  CC: left leg swelling. Concern for DVT HPI: 71 year old Caucasian male history of type 2 diabetes, coronary disease status post RCA stent 2001, hypertension, prior history of DVT in the left leg, hyperlipidemia, presents to the ER today with a 3-day history of left calf pain.  He has also had a 4-week history of worsening dyspnea on exertion.  Patient states his calf started hurting about 3 days ago.  No acute injuries.  Denies any stronger than normal exertion.  He has had about 4 weeks of increasing dyspnea exertion.  States that even walking to his mailbox, he feels very winded.  Denies any chest pain.  On arrival temp 98 heart rate 93 blood pressure 116/66 satting 98% on room air.  Labs showed BNP normal at 16.8, D-dimer is elevated at 1.35  Sodium 132, potassium 6.3, BUN of 51, creatinine 2.4  Patient does eat oranges and bananas almost on a daily basis.  White count 11.2, hemoglobin 14.9, platelets of 130  Chest x-ray showed no acute cardiopulmonary disease.  EKG personally interpreted by myself shows normal sinus rhythm with an old inferior infarct.  [facility] hospital[facility] contacted for admission.   ED Course: d-dimer 1.35, k of 6.3, Scr 2.4  Review of Systems:  Review of Systems  Constitutional: Negative.   HENT: Negative.    Eyes: Negative.   Respiratory:  Positive for shortness of breath.   Cardiovascular:  Positive for leg swelling. Negative for chest pain and palpitations.       DOE with minimal activity Left leg swelling >> right leg swelling  Gastrointestinal: Negative.   Genitourinary: Negative.   Musculoskeletal: Negative.   Skin: Negative.   Neurological: Negative.    Endo/Heme/Allergies: Negative.   Psychiatric/Behavioral: Negative.    All other systems reviewed and are negative.   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   by [place]   CAD (coronary artery disease) 2003   cath - Min Dz, EF 65%, Adm.R/O'd   Cervical spondylosis without myelopathy    Chest pain, atypical    Choroidal nevus 01/22/2011   left, yearly eye exam, no diabetic retinopathy   Diabetes mellitus type II    DVT of deep femoral vein, left ([place]) 04/29/2018   LLE DVT from proximal femoral vein to popliteal vein into proximal calf (04/29/2018) started on xarelto Heme (Magrinat) recommended continuing lifelong lower dose anticoagulant indefinitely (11/2018)   Dyspepsia    Ex-smoker    Fatty liver 02/2016   by [place]   GERD (gastroesophageal reflux disease)    Headache(784.0)    Heart murmur    Hx of   History of MRI of brain and brain stem 09/06   History of MRSA infection 12/2013   R knee boil   HLD (hyperlipidemia)    Hyperuricemia    Obesity    Sleep apnea    Stress-induced cardiomyopathy 09/27/98   WNL, EF 64%   Urosepsis 01/01-01/05/10   Hospitalization    Past Surgical History:  Procedure Laterality  Date   CARDIAC CATHETERIZATION     COLONOSCOPY  07/2013   1 benign polyp rpt 10 yrs [name])   CORONARY THROMBECTOMY N/A 11/12/2019   Procedure: Coronary Thrombectomy;  Surgeon: Belva Crome, MD;  Location: [facility];  Service: Cardiovascular;  Laterality: N/A;   CORONARY/GRAFT ACUTE MI REVASCULARIZATION N/A 11/12/2019   Procedure: Coronary/Graft Acute MI Revascularization;  Surgeon: Belva Crome, MD;  Location: [facility];  Service: Cardiovascular;  Laterality: N/A;   KNEE ARTHROSCOPY Left 1988   KNEE SURGERY Right 05/21/03   Right, Dr.  Marlou Sa, med meniscus tear via MRI   LEFT HEART CATH AND CORONARY ANGIOGRAPHY N/A 04/01/2017   Procedure: LEFT HEART CATH AND CORONARY ANGIOGRAPHY;  Surgeon: Martinique, Peter M, MD;  Location: [facility];  Service: Cardiovascular;  Laterality: N/A;   LEFT HEART CATH AND CORONARY ANGIOGRAPHY N/A 11/12/2019   Procedure: LEFT HEART CATH AND CORONARY ANGIOGRAPHY;  Surgeon: Belva Crome, MD;  Location: [facility];  Service: Cardiovascular;  Laterality: N/A;   MYELOGRAM  08/05   bulging disc     reports that he quit smoking about 47 years ago. His smoking use included cigarettes. He has never used smokeless tobacco. He reports current alcohol use of about 3.0 standard drinks of alcohol per week. He reports that he does not use drugs.  No Known Allergies  Family History  Problem Relation Age of Onset   Heart disease Mother        CHF   Diabetes Mother    Heart disease Father        CHF   Hypertension Father    Migraines Brother        severe headaches from arsenic in the past from  wood that was treated on his deck   CAD Brother 22       stent   Cancer Maternal Uncle        unsure   Stroke Neg Hx    Colon cancer Neg Hx     Prior to Admission medications   Medication Sig Start Date End Date Taking? Authorizing Provider  acetaminophen (TYLENOL) 500 MG tablet Take 1,000 mg by mouth 2 (two) times daily as needed for headache (pain).    [provider]  aspirin EC 81 MG tablet Take 81 mg by mouth daily with supper. Swallow whole.    [provider]  Calcium Carbonate Antacid (TUMS PO) Take 1 tablet by mouth daily as needed (acid reflux/indigestion).    [provider]  Cholecalciferol (VITAMIN D) 2000 units tablet Take 2,000 Units by mouth daily with breakfast.     [provider]  finasteride (PROSCAR) 5 MG tablet TAKE 1 TABLET BY MOUTH EVERY DAY 06/04/21   [name], MD  Insulin Glargine [facility]) 100 UNIT/ML Inject 75 Units  into the skin at bedtime. 06/10/20   [name], MD  Insulin Pen Needle (B-D ULTRAFINE III SHORT PEN) 31G X 8 MM MISC 1 Device by Other route 4 (four) times daily. USE AS DIRECTED DAILY 06/09/18   Renato Shin, MD  Iron, Ferrous Sulfate, 325 (65 Fe) MG TABS Take 325 mg by mouth every other day. 06/16/21   [name], MD  losartan (COZAAR) 25 MG tablet TAKE 1 TABLET (25 MG TOTAL) BY MOUTH DAILY. 09/25/21   Martinique, Peter M, MD  metFORMIN (GLUCOPHAGE) 1000 MG tablet TAKE 1 TABLET BY MOUTH EVERY DAY AT [facility] 06/04/21   [name], MD  metoprolol tartrate (LOPRESSOR) 25 MG tablet TAKE 0.5 TABLETS BY MOUTH 2 TIMES DAILY. 10/16/21   Martinique, Peter M, MD  Multiple Vitamin (MULTIVITAMIN WITH MINERALS) TABS tablet Take 1 tablet by mouth daily with breakfast.     [provider]  nitroGLYCERIN (NITROSTAT) 0.4 MG SL tablet PLACE 1 TABLET UNDER THE TONGUE EVERY 5 MINUTES AS NEEDED FOR CHEST PAIN 05/27/21   Martinique, Peter M, MD  omeprazole (PRILOSEC) 40 MG capsule Take 1 capsule (40 mg total) by mouth daily as needed. For 3 weeks then as needed 10/10/21   [name], MD  [facility] ULTRA test strip USE TO CHECK SUGARS DAILY  AS DIRECTED 03/13/19   [name], MD  probenecid (BENEMID) 500 MG tablet Take 0.5 tablets (250 mg total) by mouth every other day. 10/14/21   [name], MD  rosuvastatin (CRESTOR) 40 MG tablet Take 1 tablet (40 mg total) by mouth daily with supper. 10/16/21   Martinique, Peter M, MD  tamsulosin (FLOMAX) 0.4 MG CAPS capsule Take 1 capsule (0.4 mg total) by mouth daily. 06/16/21   [name], MD  TRULICITY 0.34 JZ/7.9XT SOPN SMARTSIG:0.5 Milliliter(s) SUB-Q Once a Week 05/26/21   [provider]    Physical Exam: Vitals:   01/02/22 2237 01/02/22 2239 01/02/22 2305 01/03/22 0000  BP: 127/72  106/75 (!) 133/110  Pulse: 90 91 92 92  Resp:  '18 18 14  '$ Temp:      TempSrc:      SpO2: 99% 95% 96% 100%  Weight:      Height:        Physical  Exam Vitals and nursing note reviewed.  Constitutional:      General: He is not in acute distress.    Appearance: Normal appearance. He is obese. He is not ill-appearing, toxic-appearing or diaphoretic.  HENT:     Head: Normocephalic and atraumatic.     Nose: Nose normal.  Eyes:     General: No scleral icterus. Cardiovascular:     Rate and Rhythm: Regular rhythm. Tachycardia present.     Pulses: Normal pulses.  Pulmonary:     Effort: Pulmonary effort is normal. No respiratory distress.     Breath sounds: Normal breath sounds.  Abdominal:     General: Abdomen is protuberant. Bowel sounds are normal. There is no distension.     Palpations: Abdomen is soft.     Tenderness: There is no abdominal tenderness. There is no guarding or rebound.  Musculoskeletal:     Right lower leg: Edema present.     Left lower leg: Edema present.     Comments: L lower leg edema >> R lower leg edema +1-2 pitting left leg edema +1 pitting right leg edema  No palpable cords in lower legs/calves.  Skin:    General: Skin is warm and dry.     Capillary Refill: Capillary refill takes less than 2 seconds.  Neurological:     General: No focal deficit present.     Mental Status: He is alert and oriented to person, place, and time.      Labs on Admission: I have personally reviewed following labs and imaging studies  CBC: Recent Labs  Lab 01/02/22 2019  WBC 11.2*  NEUTROABS 8.9*  HGB 14.9  HCT 43.3  MCV 89.5  PLT 056*   Basic Metabolic Panel: Recent Labs  Lab 01/02/22 2019  NA 132*  K 6.3*  CL 104  CO2 17*  GLUCOSE 242*  BUN 51*  CREATININE 2.44*  CALCIUM 9.3   GFR: Estimated Creatinine Clearance: 30.8 mL/min (A) (by C-G formula based on SCr of 2.44 mg/dL (H)). Liver Function Tests: Recent Labs  Lab 01/02/22 2019  AST 33  ALT 31  ALKPHOS 40  BILITOT 0.9  PROT 7.1  ALBUMIN 4.0   No results for input(s): "LIPASE", "AMYLASE" in the last 168 hours. No results for input(s):  "AMMONIA" in the last 168 hours. Coagulation Profile: No results for input(s): "INR", "PROTIME" in the last 168 hours. Cardiac Enzymes: Recent Labs  Lab 01/02/22 2019 01/02/22 2302  TROPONINIHS 8 9   BNP (last 3 results) No results for input(s): "PROBNP" in the last 8760 hours. HbA1C: No results for input(s): "HGBA1C" in the last 72 hours. CBG: No results for input(s): "GLUCAP" in the last 168 hours. Lipid Profile: No results for input(s): "CHOL", "HDL", "LDLCALC", "TRIG", "CHOLHDL", "LDLDIRECT" in the last 72 hours. Thyroid Function Tests: No results for input(s): "TSH", "T4TOTAL", "FREET4", "T3FREE", "THYROIDAB" in the last 72 hours. Anemia Panel: No results for input(s): "VITAMINB12", "FOLATE", "FERRITIN", "TIBC", "IRON", "RETICCTPCT" in the last 72 hours. Urine analysis:    Component Value Date/Time   COLORURINE YELLOW 12/10/2013 [place] 12/10/2013 1103   LABSPEC 1.014 12/10/2013 1103   PHURINE 5.0 12/10/2013 1103   GLUCOSEU NEGATIVE 12/10/2013 1103   HGBUR NEGATIVE 12/10/2013 1103   HGBUR negative 07/29/2009 1549   BILIRUBINUR Neg 10/22/2016 1713   KETONESUR NEGATIVE 12/10/2013 1103   PROTEINUR Neg 10/22/2016 1713   PROTEINUR NEGATIVE 12/10/2013 1103   UROBILINOGEN 0.2 10/22/2016 1713   UROBILINOGEN 0.2 12/10/2013 1103   NITRITE Neg 10/22/2016 1713   NITRITE NEGATIVE 12/10/2013 1103   LEUKOCYTESUR Negative 10/22/2016 1713    Radiological Exams on Admission: I have personally reviewed images DG Chest 2 View  Result Date: 01/02/2022 CLINICAL DATA:  Shortness of breath EXAM: CHEST - 2 VIEW COMPARISON:  06/16/2021 FINDINGS: The heart size and mediastinal contours are within normal limits. Both lungs are clear. The visualized skeletal structures are unremarkable. IMPRESSION: No active cardiopulmonary disease. Electronically Signed   By: Inez Catalina M.D.   On: 01/02/2022 20:40    EKG: My personal interpretation of EKG shows: NSR, old inferior  infarct    Assessment/Plan Principal Problem:   AKI (acute kidney injury) ([place]) Active Problems:   Dyspnea on exertion   Hyperkalemia   Elevated d-dimer   Type 2 diabetes, controlled, with peripheral neuropathy (HCC)   Essential hypertension, benign   Obesity, Class I, BMI 30-34.9   History of DVT (deep vein thrombosis)   Hyperlipidemia associated with type 2 diabetes mellitus (HCC)    Assessment and Plan: * AKI (acute kidney injury) ([place]) Admit to med/tele bed. Observation. Unclear the etiology. Pt does not take any diuretics. Denies any use of NSAIDs(ibuprofen, naproxen). He does take a ASA 81 mg daily. Will check bladder scan/post void residual. Continue with IVF NS @ 75 ml/hr. Repeat CMP in AM.  Elevated d-dimer Unclear the etiology. D-dimer can certainly be elevated due to AKI. But with pt's hx of prior DVT, SOB/DOE, cannot exclude DVT/PE. Will start empiric IV heparin. Will need LE U/S in AM. Will need to wait for AKI to resolve before CTPA.  Hyperkalemia Treated with po lokelma. Continue with IVF. Hold ARB. Repeat CMP in AM.  Dyspnea on exertion Will check echo. Prior RCA stenosis in 2021 requiring PCI. Also had moderate 50% disease in LAD and PDA. May need outpatient stress test.  History of DVT (deep vein  thrombosis) Chronic.  Obesity, Class I, BMI 30-34.9 Chronic.  Essential hypertension, benign Stable. Continue lopressor 12.5 mg bid. Holding ARB due to AKI/hyperkalemia.  Type 2 diabetes, controlled, with peripheral neuropathy (HCC) Continue lantus but reduce dose to 50 units due to AKI. Check A1c. Add SSI.  Hyperlipidemia associated with type 2 diabetes mellitus (HCC) Stable. Continue crestor 40 mg qd.   DVT prophylaxis: IV heparin gtts Code Status: Full Code Family Communication: discussed with pt and his wife debbie at bedside  Disposition Plan: return home  Consults called: none  Admission status: Observation, Telemetry bed   Kristopher Oppenheim, DO [facility] 01/03/2022, 1:12 AM  ]

## 2022-01-03 NOTE — ED Notes (Signed)
MD Bridgett Larsson notified of BP

## 2022-01-03 NOTE — Assessment & Plan Note (Signed)
Continue lantus but reduce dose to 50 units due to AKI. Check A1c. Add SSI.

## 2022-01-03 NOTE — Progress Notes (Signed)
PROGRESS NOTE    Alan Morrison  MOL:[id number] DOB: 1950/07/29 DOA: 01/02/2022 PCP: [name], MD   Brief Narrative:  71 year old Caucasian male history of type 2 diabetes, coronary disease status post RCA stent 2001, hypertension, prior history of DVT in the left leg, hyperlipidemia, presents to the ER today with a 3-day history of left calf pain.  He has also had a 4-week history of worsening dyspnea on exertion.   Patient states his calf started hurting about 3 days ago.  No acute injuries.  Denies any stronger than normal exertion.   He has had about 4 weeks of increasing dyspnea exertion.  States that even walking to his mailbox, he feels very winded.  Denies any chest pain.   On arrival temp 98 heart rate 93 blood pressure 116/66 satting 98% on room air.   Labs showed BNP normal at 16.8, D-dimer is elevated at 1.35   Sodium 132, potassium 6.3, BUN of 51, creatinine 2.4   Patient does eat oranges and bananas almost on a daily basis.   White count 11.2, hemoglobin 14.9, platelets of 130   Chest x-ray showed no acute cardiopulmonary disease.   EKG personally interpreted by myself shows normal sinus rhythm with an old inferior infarct.   [facility] contacted for admission.     Assessment & Plan:   Principal Problem:   AKI (acute kidney injury) ([place]) Active Problems:   Dyspnea on exertion   Hyperkalemia   Elevated d-dimer   Type 2 diabetes, controlled, with peripheral neuropathy (HCC)   Essential hypertension, benign   Obesity, Class I, BMI 30-34.9   History of DVT (deep vein thrombosis)   Hyperlipidemia associated with type 2 diabetes mellitus (HCC)  AKI (acute kidney injury) (HCC)/dehydration: Could very well be due to hyperglycemia/DKA.  Improving.  Continue IV fluids.   Elevated d-dimer/dyspnea on exertion: Patient is feeling better.  Some shortness of breath with exertion but he is not hypoxic.  Clinically, I personally doubt PE however I will  continue empiric heparin for now, if Doppler lower extremities negative for DVT, plan is to stop heparin and place him on DVT prophylaxis dose.  Echo pending.   Hyperkalemia: Treated with Lokelma.  Now resolved.   History of CAD: Prior RCA stenosis in 2021 requiring PCI. Also had moderate 50% disease in LAD and PDA. May need outpatient stress test.   History of DVT (deep vein thrombosis): Not on any anticoagulation at the moment. Chronic.   Obesity, Class I, BMI 30-34.9 Chronic.  Dietary modification and weight loss counseled.   Essential hypertension, benign Stable. Continue lopressor 12.5 mg bid. Holding ARB due to AKI/hyperkalemia.   Type 2 diabetes, controlled, with peripheral neuropathy (HCC)/DKA: Patient was noted to be in DKA and thus he was started on insulin drip.  DKA has resolved.  We will start him on Lantus 50 units now and 2 hours later, we will stop his insulin drip.  And will start him on SSI.  Hyperlipidemia associated with type 2 diabetes mellitus (HCC) Stable. Continue crestor 40 mg qd.   DVT prophylaxis: SCDs Start: 01/03/22 0155   Code Status: Full Code  Family Communication: Wife present at bedside.  Plan of care discussed with patient in length and he/she verbalized understanding and agreed with it.  Status is: Observation The patient will require care spanning > 2 midnights and should be moved to inpatient because: Needs IV fluids.   Estimated body mass index is 32.58 kg/m as calculated from the following:  Height as of this encounter: '5\' 7"'$  (1.702 m).   Weight as of this encounter: 94.3 kg.    Nutritional Assessment: Body mass index is 32.58 kg/m.[name] Seen by dietician.  I agree with the assessment and plan as outlined below: Nutrition Status:        . Skin Assessment: I have examined the patient's skin and I agree with the wound assessment as performed by the wound care RN as outlined below:    Consultants:  None  Procedures:   None  Antimicrobials:  Anti-infectives (From admission, onward)    None         Subjective: Patient seen and examined.  He feels better than yesterday.  Minimal exertional dyspnea but appears very comfortable.  Objective: Vitals:   01/03/22 0740 01/03/22 0800 01/03/22 0830 01/03/22 0950  BP:  (!) 108/57 109/70   Pulse:  96 91   Resp:  19 14   Temp: 97.6 F (36.4 C)   (!) 97.4 F (36.3 C)  TempSrc: Oral   Oral  SpO2:  99% 97%   Weight:      Height:        Intake/Output Summary (Last 24 hours) at 01/03/2022 1005 Last data filed at 01/03/2022 0902 Gross per 24 hour  Intake 1063.13 ml  Output 200 ml  Net 863.13 ml   Filed Weights   01/02/22 2002  Weight: 94.3 kg    Examination:  General exam: Appears calm and comfortable, obese Respiratory system: Clear to auscultation. Respiratory effort normal. Cardiovascular system: S1 & S2 heard, RRR. No JVD, murmurs, rubs, gallops or clicks. No pedal edema. Gastrointestinal system: Abdomen is nondistended, soft and nontender. No organomegaly or masses felt. Normal bowel sounds heard. Central nervous system: Alert and oriented. No focal neurological deficits. Extremities: Symmetric 5 x 5 power. Skin: No rashes, lesions or ulcers Psychiatry: Judgement and insight appear normal. Mood & affect appropriate.    Data Reviewed: I have personally reviewed following labs and imaging studies  CBC: Recent Labs  Lab 01/02/22 2019 01/03/22 0512 01/03/22 0547  WBC 11.2* 9.6  --   NEUTROABS 8.9* 6.8  --   HGB 14.9 12.6* 12.2*  HCT 43.3 36.7* 36.0*  MCV 89.5 89.3  --   PLT 130* 111*  --    Basic Metabolic Panel: Recent Labs  Lab 01/02/22 2019 01/03/22 0139 01/03/22 0512 01/03/22 0547 01/03/22 0910  NA 132* 133* 132* 132* 135  K 6.3* 3.9 4.2 4.2 3.6  CL 104 102 102  --  105  CO2 17* 12* 14*  --  18*  GLUCOSE 242* 322* 378*  --  173*  BUN 51* 53* 53*  --  49*  CREATININE 2.44* 2.47* 2.40*  --  2.17*  CALCIUM 9.3 8.7*  8.7*  --  8.6*  MG  --   --  1.2*  --   --    GFR: Estimated Creatinine Clearance: 34.7 mL/min (A) (by C-G formula based on SCr of 2.17 mg/dL (H)). Liver Function Tests: Recent Labs  Lab 01/02/22 2019 01/03/22 0512  AST 33 27  ALT 31 25  ALKPHOS 40 32*  BILITOT 0.9 0.7  PROT 7.1 5.8*  ALBUMIN 4.0 3.2*   No results for input(s): "LIPASE", "AMYLASE" in the last 168 hours. No results for input(s): "AMMONIA" in the last 168 hours. Coagulation Profile: No results for input(s): "INR", "PROTIME" in the last 168 hours. Cardiac Enzymes: No results for input(s): "CKTOTAL", "CKMB", "CKMBINDEX", "TROPONINI" in the last 168 hours. BNP (  last 3 results) No results for input(s): "PROBNP" in the last 8760 hours. HbA1C: No results for input(s): "HGBA1C" in the last 72 hours. CBG: Recent Labs  Lab 01/03/22 0348 01/03/22 0532 01/03/22 0647 01/03/22 0751 01/03/22 0900  GLUCAP 359* 389* 337* 263* 178*   Lipid Profile: No results for input(s): "CHOL", "HDL", "LDLCALC", "TRIG", "CHOLHDL", "LDLDIRECT" in the last 72 hours. Thyroid Function Tests: No results for input(s): "TSH", "T4TOTAL", "FREET4", "T3FREE", "THYROIDAB" in the last 72 hours. Anemia Panel: No results for input(s): "VITAMINB12", "FOLATE", "FERRITIN", "TIBC", "IRON", "RETICCTPCT" in the last 72 hours. Sepsis Labs: No results for input(s): "PROCALCITON", "LATICACIDVEN" in the last 168 hours.  Recent Results (from the past 240 hour(s))  Resp Panel by RT-PCR (Flu A&B, Covid) Anterior Nasal Swab     Status: None   Collection Time: 01/02/22 11:39 PM   Specimen: Anterior Nasal Swab  Result Value Ref Range Status   SARS Coronavirus 2 by RT PCR NEGATIVE NEGATIVE Final    Comment: (NOTE) SARS-CoV-2 target nucleic acids are NOT DETECTED.  The SARS-CoV-2 RNA is generally detectable in upper respiratory specimens during the acute phase of infection. The lowest concentration of SARS-CoV-2 viral copies this assay can detect is 138  copies/mL. A negative result does not preclude SARS-Cov-2 infection and should not be used as the sole basis for treatment or other patient management decisions. A negative result may occur with  improper specimen collection/handling, submission of specimen other than nasopharyngeal swab, presence of viral mutation(s) within the areas targeted by this assay, and inadequate number of viral copies(<138 copies/mL). A negative result must be combined with clinical observations, patient history, and epidemiological information. The expected result is Negative.  Fact Sheet for Patients:  EntrepreneurPulse.com.au  Fact Sheet for Healthcare Providers:  IncredibleEmployment.be  This test is no t yet approved or cleared by the [place] FDA and  has been authorized for detection and/or diagnosis of SARS-CoV-2 by FDA under an Emergency Use Authorization (EUA). This EUA will remain  in effect (meaning this test can be used) for the duration of the COVID-19 declaration under Section 564(b)(1) of the Act, 21 U.S.C.section 360bbb-3(b)(1), unless the authorization is terminated  or revoked sooner.       Influenza A by PCR NEGATIVE NEGATIVE Final   Influenza B by PCR NEGATIVE NEGATIVE Final    Comment: (NOTE) The Xpert Xpress SARS-CoV-2/FLU/RSV plus assay is intended as an aid in the diagnosis of influenza from Nasopharyngeal swab specimens and should not be used as a sole basis for treatment. Nasal washings and aspirates are unacceptable for Xpert Xpress SARS-CoV-2/FLU/RSV testing.  Fact Sheet for Patients: EntrepreneurPulse.com.au  Fact Sheet for Healthcare Providers: IncredibleEmployment.be  This test is not yet approved or cleared by the [place] FDA and has been authorized for detection and/or diagnosis of SARS-CoV-2 by FDA under an Emergency Use Authorization (EUA). This EUA will remain in effect (meaning  this test can be used) for the duration of the COVID-19 declaration under Section 564(b)(1) of the Act, 21 U.S.C. section 360bbb-3(b)(1), unless the authorization is terminated or revoked.  Performed at [facility], [place] [address] [place], [place] [address]      Radiology Studies: DG Chest 2 View  Result Date: 01/02/2022 CLINICAL DATA:  Shortness of breath EXAM: CHEST - 2 VIEW COMPARISON:  06/16/2021 FINDINGS: The heart size and mediastinal contours are within normal limits. Both lungs are clear. The visualized skeletal structures are unremarkable. IMPRESSION: No active cardiopulmonary disease. Electronically Signed  By: Inez Catalina M.D.   On: 01/02/2022 20:40    Scheduled Meds:  aspirin EC  81 mg Oral Q supper   finasteride  5 mg Oral Daily   metoprolol tartrate  12.5 mg Oral BID   rosuvastatin  40 mg Oral Q supper   tamsulosin  0.4 mg Oral Daily   Continuous Infusions:  sodium chloride Stopped (01/03/22 0902)   dextrose 5 % and 0.9% NaCl 125 mL/hr at 01/03/22 0903   heparin 1,400 Units/hr (01/03/22 0201)   insulin 2.8 Units/hr (01/03/22 0902)     LOS: 0 days   Darliss Cheney, MD [facility]  01/03/2022, 10:05 AM   *Please note that this is a verbal dictation therefore any spelling or grammatical errors are due to the "[place] [employer]" system interpretation.  Please page via [place] and do not message via secure chat for urgent patient care matters. Secure chat can be used for non urgent patient care matters.  How to contact the [facility] Attending or Consulting provider [place] or covering provider during after hours [place], for this patient?  Check the care team in [facility] and look for a) attending/consulting [facility] provider listed and b) the [facility] team listed. Page or secure chat [phone]. Log into [email or web address] and use [place]'s universal password to access. If you do not have the password, please contact the hospital operator. Locate the [facility] provider you are looking  for under [facility] and page to a number that you can be directly reached. If you still have difficulty reaching the provider, please page the [facility] (Director on Call) for the Hospitalists listed on amion for assistance.

## 2022-01-03 NOTE — Progress Notes (Signed)
ANTICOAGULATION CONSULT NOTE - Initial Consult  Pharmacy Consult for Heparin Indication: DVT  No Known Allergies  Patient Measurements: Height: '5\' 7"'$  (170.2 cm) Weight: 94.3 kg (208 lb) IBW/kg (Calculated) : 66.1 Heparin Dosing Weight: 86 kg  Vital Signs: Temp: 98 F (36.7 C) (09/15 1949) Temp Source: Oral (09/15 1949) BP: 133/110 (09/16 0000) Pulse Rate: 92 (09/16 0000)  Labs: Recent Labs    01/02/22 2019 01/02/22 2302  HGB 14.9  --   HCT 43.3  --   PLT 130*  --   CREATININE 2.44*  --   TROPONINIHS 8 9    Estimated Creatinine Clearance: 30.8 mL/min (A) (by C-G formula based on SCr of 2.44 mg/dL (H)).   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   by [place]   CAD (coronary artery disease) 2003   cath - Min Dz, EF 65%, Adm.R/O'd   Cervical spondylosis without myelopathy    Chest pain, atypical    Choroidal nevus 01/22/2011   left, yearly eye exam, no diabetic retinopathy   Diabetes mellitus type II    Dyspepsia    Ex-smoker    Fatty liver 02/2016   by [place]   GERD (gastroesophageal reflux disease)    Headache(784.0)    Heart murmur    Hx of   History of MRI of brain and brain stem 09/06   History of MRSA infection 12/2013   R knee boil   HLD (hyperlipidemia)    Hyperuricemia    Obesity    Sleep apnea    Stress-induced cardiomyopathy 09/27/98   WNL, EF 64%   Urosepsis 01/01-01/05/10   Hospitalization    Assessment: 71 yo several weeks of increasing shortness of breath.  No chest pain associated.  He has noted some swelling in his legs.  He was concerned due to history of DVT.  He is not currently on anticoagulants.  Goal of Therapy:  Heparin level 0.3-0.7 units/ml Monitor platelets by anticoagulation protocol: Yes   Plan:  Give 5000 units bolus x 1 Start heparin infusion at 1400 units/hr Check anti-Xa level in 8 hours and daily while on  heparin Continue to monitor H&H and platelets  Alanda Slim, PharmD, [facility] Clinical Pharmacist Please see AMION for all Pharmacists' Contact Phone Numbers 01/03/2022, 12:54 AM

## 2022-01-03 NOTE — Assessment & Plan Note (Signed)
Stable. Continue lopressor 12.5 mg bid. Holding ARB due to AKI/hyperkalemia.

## 2022-01-03 NOTE — Assessment & Plan Note (Signed)
Unclear the etiology. D-dimer can certainly be elevated due to AKI. But with pt's hx of prior DVT, SOB/DOE, cannot exclude DVT/PE. Will start empiric IV heparin. Will need LE U/S in AM. Will need to wait for AKI to resolve before CTPA.

## 2022-01-03 NOTE — Assessment & Plan Note (Signed)
Chronic. 

## 2022-01-03 NOTE — Assessment & Plan Note (Signed)
Stable. Continue crestor 40 mg qd.

## 2022-01-04 ENCOUNTER — Inpatient Hospital Stay (HOSPITAL_COMMUNITY): Payer: Medicare HMO

## 2022-01-04 DIAGNOSIS — R0609 Other forms of dyspnea: Secondary | ICD-10-CM | POA: Diagnosis not present

## 2022-01-04 DIAGNOSIS — N179 Acute kidney failure, unspecified: Secondary | ICD-10-CM | POA: Diagnosis not present

## 2022-01-04 LAB — BASIC METABOLIC PANEL
Anion gap: 9 (ref 5–15)
Anion gap: 7 (ref 5–15)
BUN: 36 mg/dL — ABNORMAL HIGH (ref 8–23)
BUN: 30 mg/dL — ABNORMAL HIGH (ref 8–23)
CO2: 20 mmol/L — ABNORMAL LOW (ref 22–32)
CO2: 21 mmol/L — ABNORMAL LOW (ref 22–32)
Calcium: 8.6 mg/dL — ABNORMAL LOW (ref 8.9–10.3)
Calcium: 8.5 mg/dL — ABNORMAL LOW (ref 8.9–10.3)
Chloride: 109 mmol/L (ref 98–111)
Chloride: 109 mmol/L (ref 98–111)
Creatinine, Ser: 1.73 mg/dL — ABNORMAL HIGH (ref 0.61–1.24)
Creatinine, Ser: 1.55 mg/dL — ABNORMAL HIGH (ref 0.61–1.24)
GFR, Estimated: 42 mL/min — ABNORMAL LOW (ref >60)
GFR, Estimated: 48 mL/min — ABNORMAL LOW (ref >60)
Glucose, Bld: 139 mg/dL — ABNORMAL HIGH (ref 70–99)
Glucose, Bld: 220 mg/dL — ABNORMAL HIGH (ref 70–99)
Potassium: 4.4 mmol/L (ref 3.5–5.1)
Potassium: 4.6 mmol/L (ref 3.5–5.1)
Sodium: 138 mmol/L (ref 135–145)
Sodium: 137 mmol/L (ref 135–145)

## 2022-01-04 LAB — ECHOCARDIOGRAM COMPLETE
AR max vel: 2.49 cm2
AV Area VTI: 2.61 cm2
AV Area mean vel: 2.24 cm2
AV Mean grad: 4 mmHg
AV Peak grad: 6.9 mmHg
Ao pk vel: 1.31 m/s
Area-P 1/2: 3.95 cm2
Calc EF: 49.9 %
Height: 67 in
MV VTI: 2.26 cm2
S' Lateral: 3.5 cm
Single Plane A2C EF: 51 %
Single Plane A4C EF: 48.3 %
Weight: 3308.8 oz

## 2022-01-04 LAB — CBC
HCT: 36.5 % — ABNORMAL LOW (ref 39.0–52.0)
Hemoglobin: 12.9 g/dL — ABNORMAL LOW (ref 13.0–17.0)
MCH: 31.2 pg (ref 26.0–34.0)
MCHC: 35.3 g/dL (ref 30.0–36.0)
MCV: 88.2 fL (ref 80.0–100.0)
Platelets: 122 10*3/uL — ABNORMAL LOW (ref 150–400)
RBC: 4.14 MIL/uL — ABNORMAL LOW (ref 4.22–5.81)
RDW: 13.4 % (ref 11.5–15.5)
WBC: 5.8 10*3/uL (ref 4.0–10.5)
nRBC: 0 % (ref 0.0–0.2)

## 2022-01-04 LAB — BETA-HYDROXYBUTYRIC ACID: Beta-Hydroxybutyric Acid: 0.11 mmol/L (ref 0.05–0.27)

## 2022-01-04 LAB — HEPARIN LEVEL (UNFRACTIONATED): Heparin Unfractionated: <0.1 IU/mL — ABNORMAL LOW (ref 0.30–0.70)

## 2022-01-04 LAB — GLUCOSE, CAPILLARY
Glucose-Capillary: 123 mg/dL — ABNORMAL HIGH (ref 70–99)
Glucose-Capillary: 195 mg/dL — ABNORMAL HIGH (ref 70–99)
Glucose-Capillary: 184 mg/dL — ABNORMAL HIGH (ref 70–99)

## 2022-01-04 MED ORDER — METFORMIN HCL 1000 MG PO TABS: 1000.0000 mg | ORAL_TABLET | Freq: Every day | ORAL | 3 refills | Status: DC

## 2022-01-04 MED ORDER — LOSARTAN POTASSIUM 25 MG PO TABS: 25.0000 mg | ORAL_TABLET | Freq: Every day | ORAL | 1 refills | Status: DC

## 2022-01-04 MED ORDER — SODIUM CHLORIDE 0.9 % IV SOLN: INTRAVENOUS | Status: DC

## 2022-01-04 MED ORDER — ENOXAPARIN SODIUM 40 MG/0.4ML IJ SOSY
40.0000 mg | PREFILLED_SYRINGE | INTRAMUSCULAR | Status: DC
  Administered 2022-01-04: 40 mg via SUBCUTANEOUS
  Filled 2022-01-04: qty 0.4

## 2022-01-04 NOTE — Progress Notes (Signed)
  Transition of Care [facility]) Screening Note   Patient Details  Name: Alan Morrison Date of Birth: 15-Mar-1951   Transition of [facility]) CM/SW Contact:    Bary Castilla, LCSW Phone Number: 01/04/2022, 12:41 PM    Transition of Care Department [facility]) has reviewed patient and no TOC needs have been identified at this time. We will continue to monitor patient advancement through interdisciplinary progression rounds. If new patient transition needs arise, please place a TOC consult.

## 2022-01-04 NOTE — Progress Notes (Signed)
Patient provided with verbal discharge instructions. Wife at beside during d/c. Paper copy of discharge provided to patient. RN answered all questions. VSS at discharge. IV removed. Patient belongings sent with patient. Patient dc'd via wheelchair to private vehicle at [employer]

## 2022-01-04 NOTE — Discharge Summary (Signed)
PatientPhysician Discharge Summary  Alan Morrison NIO:[id number] DOB: 1950-09-03 DOA: 01/02/2022  PCP: [name], MD  Admit date: 01/02/2022 Discharge date: 01/04/2022 30 Day Unplanned Readmission Risk Score    Flowsheet Row ED to Hosp-Admission (Current) from 01/02/2022 in [place] [facility] PROGRESSIVE CARE  30 Day Unplanned Readmission Risk Score (%) 18.18 Filed at 01/04/2022 1200       This score is the patient's risk of an unplanned readmission within 30 days of being discharged (0 -100%). The score is based on dignosis, age, lab data, medications, orders, and past utilization.   Low:  0-14.9   Medium: 15-21.9   High: 22-29.9   Extreme: 30 and above          Admitted From: Home Disposition: Home  Recommendations for Outpatient Follow-up:  Follow up with PCP in 1-2 weeks Please obtain BMP/CBC in one week Please do not take metformin and losartan for 2 days and resume on 01/06/2022. Please follow up with your PCP on the following pending results: Unresulted Labs (From admission, onward)     Start     Ordered   01/03/22 0500  CBC  Daily at 5am,   R      01/03/22 [place]: None Equipment/Devices: None  Discharge Condition: Stable CODE STATUS: Full code Diet recommendation: Cardiac  Subjective: Seen and examined.  Wife at bedside.  Feeling much better.  No shortness of breath as he had yesterday.  No other complaint.  He wants to go home.  Brief/Interim Summary: 71 year old Caucasian male history of type 2 diabetes, coronary disease status post RCA stent 2001, hypertension, prior history of DVT in the left leg, hyperlipidemia, presented to the ER with a 3-day history of left calf pain with no trauma history. He has also had a 4-week history of worsening dyspnea on exertion.  Upon arrival, patient was hemodynamically stable. BNP normal at 16.8, D-dimer is elevated at 1.35, Sodium 132, potassium 6.3, BUN of 51, creatinine 2.4  Patient does eat  oranges and bananas almost on a daily basis.  Chest x-ray unremarkable.  EKG unremarkable.  With elevated D-dimer, shortness of breath and calf pain, patient was presumed to have thromboembolism and he was empirically started on heparin drip while Doppler lower extremity was pending.  Due to elevated creatinine, CTA was not obtained.  Subsequently lower extremity Doppler was negative for DVT.  Patient was never hypoxic.  Clinical suspicion for PE was very low.  Anticoagulation was discontinued.  Patient continued to improve despite of discontinuation anticoagulation.  Patient echo was repeated which once again showed ejection fraction of 45 to 50% with left ventricular regional wall abnormalities, essentially no change compared to the echo done 2 years ago.  Patient also had following issues, details below.  AKI (acute kidney injury) (HCC)/dehydration: Could very well be due to hyperglycemia/DKA.  Patient's baseline creatinine is between 1.3-1.4.  Patient's creatinine has improved to 1.55 today, very close to baseline.  Patient has been advised to drink plenty of fluids for next 1 day and resume losartan and metformin on 01/06/2022.   Hyperkalemia: Treated with Lokelma.  Now resolved.   History of CAD: Prior RCA stenosis in 2021 requiring PCI. Also had moderate 50% disease in LAD and PDA. May need outpatient stress test.   History of DVT (deep vein thrombosis): Not on any anticoagulation at the moment. Chronic.   Obesity, Class I, BMI 30-34.9 Chronic.  Dietary  modification and weight loss counseled.   Essential hypertension, benign Stable. Continue lopressor 12.5 mg bid. Holding ARB due to AKI/hyperkalemia.   Type 2 diabetes, controlled, with peripheral neuropathy (HCC)/DKA: Patient was noted to be in DKA and thus he was started on insulin drip.  DKA has resolved and he was transitioned to long-acting insulin along with SSI.  Blood sugar has remained controlled.   Hyperlipidemia associated with  type 2 diabetes mellitus (HCC) Stable. Continue crestor 40 mg qd.  Discharge plan was discussed with patient and/or family member and they verbalized understanding and agreed with it.  Discharge Diagnoses:  Principal Problem:   AKI (acute kidney injury) ([place]) Active Problems:   Dyspnea on exertion   Hyperkalemia   Elevated d-dimer   Type 2 diabetes, controlled, with peripheral neuropathy (HCC)   Essential hypertension, benign   Obesity, Class I, BMI 30-34.9   History of DVT (deep vein thrombosis)   Hyperlipidemia associated with type 2 diabetes mellitus ([place])    Discharge Instructions   Allergies as of 01/04/2022   No Known Allergies      Medication List     TAKE these medications    acetaminophen 500 MG tablet Commonly known as: TYLENOL Take 1,000 mg by mouth 2 (two) times daily as needed for headache (pain).   aspirin EC 81 MG tablet Take 81 mg by mouth daily with supper. Swallow whole.   Basaglar KwikPen 100 UNIT/ML Inject 100 Units into the skin at bedtime.   finasteride 5 MG tablet Commonly known as: PROSCAR TAKE 1 TABLET BY MOUTH EVERY DAY   Insulin Pen Needle 31G X 8 MM Misc Commonly known as: B-D ULTRAFINE III SHORT PEN 1 Device by Other route 4 (four) times daily. USE AS DIRECTED DAILY   Iron (Ferrous Sulfate) 325 (65 Fe) MG Tabs Take 325 mg by mouth every other day.   losartan 25 MG tablet Commonly known as: COZAAR Take 1 tablet (25 mg total) by mouth daily. Start taking on: January 06, 2022 What changed: These instructions start on January 06, 2022. If you are unsure what to do until then, ask your doctor or other care provider.   metFORMIN 1000 MG tablet Commonly known as: GLUCOPHAGE Take 1 tablet (1,000 mg total) by mouth daily with breakfast. TAKE 1 TABLET BY MOUTH EVERY DAY AT BREAKFAST Strength: 1,000 mg What changed: See the new instructions.   metoprolol tartrate 25 MG tablet Commonly known as: LOPRESSOR TAKE 0.5 TABLETS BY MOUTH  2 TIMES DAILY. What changed:  how much to take how to take this when to take this additional instructions   multivitamin with minerals Tabs tablet Take 1 tablet by mouth daily with breakfast.   nitroGLYCERIN 0.4 MG SL tablet Commonly known as: NITROSTAT PLACE 1 TABLET UNDER THE TONGUE EVERY 5 MINUTES AS NEEDED FOR CHEST PAIN   OneTouch Ultra test strip Generic drug: glucose blood USE TO CHECK SUGARS DAILY  AS DIRECTED   probenecid 500 MG tablet Commonly known as: BENEMID Take 0.5 tablets (250 mg total) by mouth every other day.   rosuvastatin 40 MG tablet Commonly known as: CRESTOR Take 1 tablet (40 mg total) by mouth daily with supper.   tamsulosin 0.4 MG Caps capsule Commonly known as: FLOMAX Take 1 capsule (0.4 mg total) by mouth daily.   Trulicity 5.88 FO/2.7XA Sopn Generic drug: Dulaglutide Inject 0.75 mg into the skin once a week. Sunday   TUMS PO Take 1 tablet by mouth daily as needed (acid reflux/indigestion).  Vitamin D 50 MCG (2000 UT) tablet Take 2,000 Units by mouth daily with breakfast.        Follow-up Information     [name], MD Follow up in 1 week(s).   Specialty: Family Medicine Contact information: [place] [address] [phone]         [place], Peter M, MD .   Specialty: Cardiology Contact information: [address] [place] [address] [phone]                No Known Allergies  Consultations: None   Procedures/Studies: ECHOCARDIOGRAM COMPLETE  Result Date: 01/04/2022    ECHOCARDIOGRAM REPORT   Patient Name:   Alan Morrison Date of Exam: 01/04/2022 Medical Rec #:  [id number]     Height:       67.0 in Accession #:    [id number]    Weight:       206.8 lb Date of Birth:  1950/08/19     BSA:          2.051 m Patient Age:    74 years      BP:           128/76 mmHg Patient Gender: M             HR:           72 bpm. Exam Location:  Inpatient Procedure: 2D Echo, Cardiac  Doppler and Color Doppler Indications:    Dyspnea  History:        Patient has prior history of Echocardiogram examinations, most                 recent 11/13/2019. CAD, Signs/Symptoms:Dyspnea, Murmur and Chest                 Pain; Risk Factors:Hypertension, Diabetes, Dyslipidemia and                 Former Smoker.  Sonographer:    Wenda Low Referring Phys: Henry  1. Left ventricular ejection fraction, by estimation, is 45 to 50%. The left ventricle has mildly decreased function. The left ventricle demonstrates regional wall motion abnormalities (see scoring diagram/findings for description). There is mild concentric left ventricular hypertrophy. Left ventricular diastolic parameters are consistent with Grade I diastolic dysfunction (impaired relaxation).  2. Right ventricular systolic function is normal. The right ventricular size is normal. Tricuspid regurgitation signal is inadequate for assessing PA pressure.  3. Left atrial size was mildly dilated.  4. The mitral valve is normal in structure. Mild mitral valve regurgitation.  5. The aortic valve is tricuspid. Aortic valve regurgitation is not visualized. No aortic stenosis is present. Comparison(s): No significant change from prior study. Prior images reviewed side by side. FINDINGS  Left Ventricle: Left ventricular ejection fraction, by estimation, is 45 to 50%. The left ventricle has mildly decreased function. The left ventricle demonstrates regional wall motion abnormalities. The left ventricular internal cavity size was normal in size. There is mild concentric left ventricular hypertrophy. Left ventricular diastolic parameters are consistent with Grade I diastolic dysfunction (impaired relaxation). Normal left ventricular filling pressure.  LV Wall Scoring: The inferior wall and posterior wall are hypokinetic. The entire anterior wall, antero-lateral wall, entire septum, and entire apex are normal. Right Ventricle: The right  ventricular size is normal. No increase in right ventricular wall thickness. Right ventricular systolic function is normal. Tricuspid regurgitation signal is inadequate for assessing PA pressure. Left Atrium: Left atrial size  was mildly dilated. Right Atrium: Right atrial size was normal in size. Pericardium: There is no evidence of pericardial effusion. Mitral Valve: The mitral valve is normal in structure. Mild mitral valve regurgitation, with eccentric posteriorly directed jet. MV peak gradient, 4.1 mmHg. The mean mitral valve gradient is 2.0 mmHg. Tricuspid Valve: The tricuspid valve is normal in structure. Tricuspid valve regurgitation is not demonstrated. Aortic Valve: The aortic valve is tricuspid. Aortic valve regurgitation is not visualized. No aortic stenosis is present. Aortic valve mean gradient measures 4.0 mmHg. Aortic valve peak gradient measures 6.9 mmHg. Aortic valve area, by VTI measures 2.61 cm. Pulmonic Valve: The pulmonic valve was normal in structure. Pulmonic valve regurgitation is not visualized. Aorta: The aortic root and ascending aorta are structurally normal, with no evidence of dilitation. Venous: The inferior vena cava was not well visualized. IAS/Shunts: No atrial level shunt detected by color flow Doppler.  LEFT VENTRICLE PLAX 2D LVIDd:         4.70 cm      Diastology LVIDs:         3.50 cm      LV e' medial:    6.96 cm/s LV PW:         1.10 cm      LV E/e' medial:  12.1 LV IVS:        1.30 cm      LV e' lateral:   10.10 cm/s LVOT diam:     2.00 cm      LV E/e' lateral: 8.4 LV SV:         74 LV SV Index:   36 LVOT Area:     3.14 cm  LV Volumes (MOD) LV vol d, MOD A2C: 88.9 ml LV vol d, MOD A4C: 108.0 ml LV vol s, MOD A2C: 43.6 ml LV vol s, MOD A4C: 55.8 ml LV SV MOD A2C:     45.3 ml LV SV MOD A4C:     108.0 ml LV SV MOD BP:      49.2 ml RIGHT VENTRICLE RV Basal diam:  3.30 cm RV Mid diam:    2.90 cm RV S prime:     9.57 cm/s TAPSE (M-mode): 2.3 cm LEFT ATRIUM             Index         RIGHT ATRIUM           Index LA diam:        4.40 cm 2.15 cm/m   RA Area:     17.70 cm LA Vol (A2C):   46.9 ml 22.86 ml/m  RA Volume:   47.80 ml  23.30 ml/m LA Vol (A4C):   34.1 ml 16.62 ml/m LA Biplane Vol: 40.0 ml 19.50 ml/m  AORTIC VALVE                    PULMONIC VALVE AV Area (Vmax):    2.49 cm     PV Vmax:       1.10 m/s AV Area (Vmean):   2.24 cm     PV Peak grad:  4.8 mmHg AV Area (VTI):     2.61 cm AV Vmax:           131.00 cm/s AV Vmean:          93.700 cm/s AV VTI:            0.285 m AV Peak Grad:      6.9 mmHg AV Mean  Grad:      4.0 mmHg LVOT Vmax:         104.00 cm/s LVOT Vmean:        66.800 cm/s LVOT VTI:          0.237 m LVOT/AV VTI ratio: 0.83  AORTA Ao Root diam: 3.70 cm MITRAL VALVE MV Area (PHT): 3.95 cm    SHUNTS MV Area VTI:   2.26 cm    Systemic VTI:  0.24 m MV Peak grad:  4.1 mmHg    Systemic Diam: 2.00 cm MV Mean grad:  2.0 mmHg MV Vmax:       1.01 m/s MV Vmean:      64.4 cm/s MV Decel Time: 192 msec MV E velocity: 84.40 cm/s MV A velocity: 93.80 cm/s MV E/A ratio:  0.90 Mihai Croitoru MD Electronically signed by Sanda Klein MD Signature Date/Time: 01/04/2022/3:13:08 PM    Final    VAS [place] LOWER EXTREMITY VENOUS (DVT) (ONLY [facility] & [facility])  Result Date: 01/04/2022  Lower Venous DVT Study Patient Name:  Alan Morrison  Date of Exam:   01/03/2022 Medical Rec #: [id number]      Accession #:    [id number] Date of Birth: [birth year]      Patient Gender: M Patient Age:   24 years Exam Location:  [facility] Procedure:      VAS [place] LOWER EXTREMITY VENOUS (DVT) Referring Phys: ERIC CHEN --------------------------------------------------------------------------------  Indications: Pain, and Swelling.  Risk Factors: DVT LLE 04/29/2018. Limitations: Poor ultrasound/tissue interface. Comparison Study: Previous exam on 11/18/18 was negative for DVT Performing Technologist: Rogelia Rohrer RVT, RDMS  Examination Guidelines: A complete evaluation includes B-mode imaging, spectral Doppler, color  Doppler, and power Doppler as needed of all accessible portions of each vessel. Bilateral testing is considered an integral part of a complete examination. Limited examinations for reoccurring indications may be performed as noted. The reflux portion of the exam is performed with the patient in reverse Trendelenburg.  +-----+---------------+---------+-----------+----------+--------------+ RIGHTCompressibilityPhasicitySpontaneityPropertiesThrombus Aging +-----+---------------+---------+-----------+----------+--------------+ CFV  Full           Yes      Yes                                 +-----+---------------+---------+-----------+----------+--------------+   +---------+---------------+---------+-----------+----------+--------------+ LEFT     CompressibilityPhasicitySpontaneityPropertiesThrombus Aging +---------+---------------+---------+-----------+----------+--------------+ CFV      Full           Yes      Yes                                 +---------+---------------+---------+-----------+----------+--------------+ SFJ      Full                                                        +---------+---------------+---------+-----------+----------+--------------+ FV Prox  Full           Yes      Yes                                 +---------+---------------+---------+-----------+----------+--------------+ FV Mid   Full           Yes  Yes                                 +---------+---------------+---------+-----------+----------+--------------+ FV DistalFull           Yes      Yes                                 +---------+---------------+---------+-----------+----------+--------------+ PFV      Full                                                        +---------+---------------+---------+-----------+----------+--------------+ POP      Full           Yes      Yes                                  +---------+---------------+---------+-----------+----------+--------------+ PTV      Full                                                        +---------+---------------+---------+-----------+----------+--------------+ PERO     Full                                                        +---------+---------------+---------+-----------+----------+--------------+    Summary: RIGHT: - No evidence of common femoral vein obstruction.  LEFT: - There is no evidence of deep vein thrombosis in the lower extremity.  - No cystic structure found in the popliteal fossa.  *See table(s) above for measurements and observations. Electronically signed by Servando Snare MD on 01/04/2022 at 10:35:52 AM.    Final    DG Chest 2 View  Result Date: 01/02/2022 CLINICAL DATA:  Shortness of breath EXAM: CHEST - 2 VIEW COMPARISON:  06/16/2021 FINDINGS: The heart size and mediastinal contours are within normal limits. Both lungs are clear. The visualized skeletal structures are unremarkable. IMPRESSION: No active cardiopulmonary disease. Electronically Signed   By: Inez Catalina M.D.   On: 01/02/2022 20:40     Discharge Exam: Vitals:   01/04/22 1125 01/04/22 1520  BP: 128/76 128/74  Pulse: 73 71  Resp: 17 20  Temp: 98 F (36.7 C) 98.4 F (36.9 C)  SpO2: 96% 96%   Vitals:   01/04/22 0727 01/04/22 0954 01/04/22 1125 01/04/22 1520  BP: (!) 152/86 113/70 128/76 128/74  Pulse: 78 73 73 71  Resp: '17  17 20  '$ Temp: 98.3 F (36.8 C)  98 F (36.7 C) 98.4 F (36.9 C)  TempSrc: Oral  Oral Oral  SpO2: 96%  96% 96%  Weight:      Height:        General: Pt is alert, awake, not in acute distress Cardiovascular: RRR, S1/S2 +, no rubs, no gallops Respiratory: CTA bilaterally, no wheezing, no rhonchi Abdominal: Soft, NT, ND, bowel  sounds + Extremities: no edema, no cyanosis    The results of significant diagnostics from this hospitalization (including imaging, microbiology, ancillary and laboratory) are listed  below for reference.     Microbiology: Recent Results (from the past 240 hour(s))  Resp Panel by RT-PCR (Flu A&B, Covid) Anterior Nasal Swab     Status: None   Collection Time: 01/02/22 11:39 PM   Specimen: Anterior Nasal Swab  Result Value Ref Range Status   SARS Coronavirus 2 by RT PCR NEGATIVE NEGATIVE Final    Comment: (NOTE) SARS-CoV-2 target nucleic acids are NOT DETECTED.  The SARS-CoV-2 RNA is generally detectable in upper respiratory specimens during the acute phase of infection. The lowest concentration of SARS-CoV-2 viral copies this assay can detect is 138 copies/mL. A negative result does not preclude SARS-Cov-2 infection and should not be used as the sole basis for treatment or other patient management decisions. A negative result may occur with  improper specimen collection/handling, submission of specimen other than nasopharyngeal swab, presence of viral mutation(s) within the areas targeted by this assay, and inadequate number of viral copies(<138 copies/mL). A negative result must be combined with clinical observations, patient history, and epidemiological information. The expected result is Negative.  Fact Sheet for Patients:  EntrepreneurPulse.com.au  Fact Sheet for Healthcare Providers:  IncredibleEmployment.be  This test is no t yet approved or cleared by the [place] FDA and  has been authorized for detection and/or diagnosis of SARS-CoV-2 by FDA under an Emergency Use Authorization (EUA). This EUA will remain  in effect (meaning this test can be used) for the duration of the COVID-19 declaration under Section 564(b)(1) of the Act, 21 U.S.C.section 360bbb-3(b)(1), unless the authorization is terminated  or revoked sooner.       Influenza A by PCR NEGATIVE NEGATIVE Final   Influenza B by PCR NEGATIVE NEGATIVE Final    Comment: (NOTE) The Xpert Xpress SARS-CoV-2/FLU/RSV plus assay is intended as an aid in the  diagnosis of influenza from Nasopharyngeal swab specimens and should not be used as a sole basis for treatment. Nasal washings and aspirates are unacceptable for Xpert Xpress SARS-CoV-2/FLU/RSV testing.  Fact Sheet for Patients: EntrepreneurPulse.com.au  Fact Sheet for Healthcare Providers: IncredibleEmployment.be  This test is not yet approved or cleared by the [place] FDA and has been authorized for detection and/or diagnosis of SARS-CoV-2 by FDA under an Emergency Use Authorization (EUA). This EUA will remain in effect (meaning this test can be used) for the duration of the COVID-19 declaration under Section 564(b)(1) of the Act, 21 U.S.C. section 360bbb-3(b)(1), unless the authorization is terminated or revoked.  Performed at [facility], [place] [address], [place],  [address]      Labs: BNP (last 3 results) Recent Labs    01/02/22 2019  BNP 62.9   Basic Metabolic Panel: Recent Labs  Lab 01/03/22 0512 01/03/22 0547 01/03/22 0910 01/03/22 1537 01/04/22 0410 01/04/22 1429  NA 132* 132* 135 137 138 137  K 4.2 4.2 3.6 4.1 4.4 4.6  CL 102  --  105 106 109 109  CO2 14*  --  18* 20* 20* 21*  GLUCOSE 378*  --  173* 107* 139* 220*  BUN 53*  --  49* 43* 36* 30*  CREATININE 2.40*  --  2.17* 1.88* 1.73* 1.55*  CALCIUM 8.7*  --  8.6* 8.6* 8.6* 8.5*  MG 1.2*  --   --   --   --   --    Liver  Function Tests: Recent Labs  Lab 01/02/22 2019 01/03/22 0512  AST 33 27  ALT 31 25  ALKPHOS 40 32*  BILITOT 0.9 0.7  PROT 7.1 5.8*  ALBUMIN 4.0 3.2*   No results for input(s): "LIPASE", "AMYLASE" in the last 168 hours. No results for input(s): "AMMONIA" in the last 168 hours. CBC: Recent Labs  Lab 01/02/22 2019 01/03/22 0512 01/03/22 0547 01/04/22 0410  WBC 11.2* 9.6  --  5.8  NEUTROABS 8.9* 6.8  --   --   HGB 14.9 12.6* 12.2* 12.9*  HCT 43.3 36.7* 36.0* 36.5*  MCV 89.5 89.3  --  88.2  PLT 130* 111*  --  122*    Cardiac Enzymes: No results for input(s): "CKTOTAL", "CKMB", "CKMBINDEX", "TROPONINI" in the last 168 hours. BNP: Invalid input(s): "POCBNP" CBG: Recent Labs  Lab 01/03/22 1134 01/03/22 1429 01/04/22 0525 01/04/22 1127 01/04/22 1521  GLUCAP 143* 112* 123* 195* 184*   D-Dimer Recent Labs    01/02/22 2019  DDIMER 1.35*   Hgb A1c No results for input(s): "HGBA1C" in the last 72 hours. Lipid Profile No results for input(s): "CHOL", "HDL", "LDLCALC", "TRIG", "CHOLHDL", "LDLDIRECT" in the last 72 hours. Thyroid function studies No results for input(s): "TSH", "T4TOTAL", "T3FREE", "THYROIDAB" in the last 72 hours.  Invalid input(s): "FREET3" Anemia work up No results for input(s): "VITAMINB12", "FOLATE", "FERRITIN", "TIBC", "IRON", "RETICCTPCT" in the last 72 hours. Urinalysis    Component Value Date/Time   COLORURINE AMBER (A) 01/03/2022 0121   APPEARANCEUR CLOUDY (A) 01/03/2022 0121   LABSPEC 1.025 01/03/2022 0121   PHURINE 5.0 01/03/2022 0121   GLUCOSEU NEGATIVE 01/03/2022 0121   HGBUR LARGE (A) 01/03/2022 0121   HGBUR negative 07/29/2009 1549   BILIRUBINUR NEGATIVE 01/03/2022 0121   BILIRUBINUR Neg 10/22/2016 1713   KETONESUR NEGATIVE 01/03/2022 0121   PROTEINUR 30 (A) 01/03/2022 0121   UROBILINOGEN 0.2 10/22/2016 1713   UROBILINOGEN 0.2 12/10/2013 1103   NITRITE NEGATIVE 01/03/2022 0121   LEUKOCYTESUR NEGATIVE 01/03/2022 0121   Sepsis Labs Recent Labs  Lab 01/02/22 2019 01/03/22 0512 01/04/22 0410  WBC 11.2* 9.6 5.8   Microbiology Recent Results (from the past 240 hour(s))  Resp Panel by RT-PCR (Flu A&B, Covid) Anterior Nasal Swab     Status: None   Collection Time: 01/02/22 11:39 PM   Specimen: Anterior Nasal Swab  Result Value Ref Range Status   SARS Coronavirus 2 by RT PCR NEGATIVE NEGATIVE Final    Comment: (NOTE) SARS-CoV-2 target nucleic acids are NOT DETECTED.  The SARS-CoV-2 RNA is generally detectable in upper respiratory specimens during  the acute phase of infection. The lowest concentration of SARS-CoV-2 viral copies this assay can detect is 138 copies/mL. A negative result does not preclude SARS-Cov-2 infection and should not be used as the sole basis for treatment or other patient management decisions. A negative result may occur with  improper specimen collection/handling, submission of specimen other than nasopharyngeal swab, presence of viral mutation(s) within the areas targeted by this assay, and inadequate number of viral copies(<138 copies/mL). A negative result must be combined with clinical observations, patient history, and epidemiological information. The expected result is Negative.  Fact Sheet for Patients:  EntrepreneurPulse.com.au  Fact Sheet for Healthcare Providers:  IncredibleEmployment.be  This test is no t yet approved or cleared by the [place] FDA and  has been authorized for detection and/or diagnosis of SARS-CoV-2 by FDA under an Emergency Use Authorization (EUA). This EUA will remain  in effect (meaning this test  can be used) for the duration of the COVID-19 declaration under Section 564(b)(1) of the Act, 21 U.S.C.section 360bbb-3(b)(1), unless the authorization is terminated  or revoked sooner.       Influenza A by PCR NEGATIVE NEGATIVE Final   Influenza B by PCR NEGATIVE NEGATIVE Final    Comment: (NOTE) The Xpert Xpress SARS-CoV-2/FLU/RSV plus assay is intended as an aid in the diagnosis of influenza from Nasopharyngeal swab specimens and should not be used as a sole basis for treatment. Nasal washings and aspirates are unacceptable for Xpert Xpress SARS-CoV-2/FLU/RSV testing.  Fact Sheet for Patients: EntrepreneurPulse.com.au  Fact Sheet for Healthcare Providers: IncredibleEmployment.be  This test is not yet approved or cleared by the [place] FDA and has been authorized for detection and/or  diagnosis of SARS-CoV-2 by FDA under an Emergency Use Authorization (EUA). This EUA will remain in effect (meaning this test can be used) for the duration of the COVID-19 declaration under Section 564(b)(1) of the Act, 21 U.S.C. section 360bbb-3(b)(1), unless the authorization is terminated or revoked.  Performed at [facility], [place] [address], [place], [place] [address]      Time coordinating discharge: Over 30 minutes  SIGNED:   Darliss Cheney, MD  [facility] 01/04/2022, 3:45 PM *Please note that this is a verbal dictation therefore any spelling or grammatical errors are due to the "[employer]" system interpretation. If 7PM-7AM, please contact night-coverage [email or web address]

## 2022-01-04 NOTE — Plan of Care (Signed)
  Problem: Coping: Goal: Ability to adjust to condition or change in health will improve Outcome: Progressing   Problem: Fluid Volume: Goal: Ability to maintain a balanced intake and output will improve Outcome: Progressing   Problem: Health Behavior/Discharge Planning: Goal: Ability to identify and utilize available resources and services will improve Outcome: Progressing Goal: Ability to manage health-related needs will improve Outcome: Progressing   Problem: Nutritional: Goal: Maintenance of adequate nutrition will improve Outcome: Progressing   Problem: Education: Goal: Knowledge of General Education information will improve Description: Including pain rating scale, medication(s)/side effects and non-pharmacologic comfort measures Outcome: Progressing   Problem: Clinical Measurements: Goal: Will remain free from infection Outcome: Progressing Goal: Diagnostic test results will improve Outcome: Progressing

## 2022-01-05 ENCOUNTER — Telehealth: Payer: Self-pay | Admitting: *Deleted

## 2022-01-05 ENCOUNTER — Encounter: Payer: Self-pay | Admitting: *Deleted

## 2022-01-05 LAB — GLUCOSE, CAPILLARY: Glucose-Capillary: 302 mg/dL — ABNORMAL HIGH (ref 70–99)

## 2022-01-05 NOTE — Patient Outreach (Signed)
  Care Coordination [facility] Note Transition Care Management Follow-up Telephone Call Date of discharge and from where: 01/04/22 from [facility] How have you been since you were released from the hospital? "I'm feeling ok" Any questions or concerns? No  Items Reviewed: Did the pt receive and understand the discharge instructions provided? No . Received discharge papers but instructions for restarting metformin and losartan were not clear. Patient was unaware that he was supposed to hold those due to AKI and restart on 01/06/22. He did take both this morning. He is drinking more water as instructed. This RNCC will notify PCP that meds were restarted today instead of tomorrow. Medications obtained and verified? Yes  Other? Yes DM is managed by endocrinologist at [facility]. Ins only covers A1C every 3 months. PCP orders this with routine labs and endo can see it through care everywhere but is unable to order it himself if done by PCP. Patient will let PCP know that endo will be ordering the A1C in order to more efficiently document his DM management. Any new allergies since your discharge? No  Dietary orders reviewed? Yes Do you have support at home? Yes   Home Care and Equipment/Supplies: Were home health services ordered? no If so, what is the name of the agency? N/a  Has the agency set up a time to come to the patient's home? not applicable Were any new equipment or medical supplies ordered?  No What is the name of the medical supply agency? N/a Were you able to get the supplies/equipment? not applicable Do you have any questions related to the use of the equipment or supplies? No  Functional Questionnaire: (I = Independent and D = Dependent) ADLs: I  Bathing/Dressing- I  Meal Prep- I  Eating- I  Maintaining continence- I  Transferring/Ambulation- I  Managing Meds- I  Follow up appointments reviewed:  PCP Hospital f/u appt confirmed? No  Staff message sent to [place]  to coordinate appointment. [facility] f/u appt confirmed? No  Staff message sent to [employer] to coordinate appointment. Are transportation arrangements needed? No  If their condition worsens, is the pt aware to call PCP or go to the Emergency Dept.? Yes Was the patient provided with contact information for the PCP's office or ED? Yes Was to pt encouraged to call back with questions or concerns? Yes  SDOH assessments and interventions completed:   Yes  Care Coordination Interventions Activated:  Yes   Care Coordination Interventions:  PCP follow up appointment requested Cardiology follow-up appointment requested    Encounter Outcome:  Pt. Visit Completed    Chong Sicilian, BSN, RN-BC RN Care Coordinator [place]: [phone] Main #: [phone]

## 2022-01-08 ENCOUNTER — Encounter: Payer: Self-pay | Admitting: Cardiology

## 2022-01-08 ENCOUNTER — Telehealth: Payer: Self-pay

## 2022-01-08 NOTE — Telephone Encounter (Signed)
-----   Message from [facility], [place] sent at 01/07/2022  2:32 PM EDT ----- Regarding: FW: TCM eligible Hello,   Can you assist in getting patient scheduled for hospital follow up!  Thank you for all that you do!  ----- Message ----- From: Ilean China, RN Sent: 01/05/2022  11:09 AM EDT To: Aviva Signs, CMA Subject: TCM eligible                                   Patient was discharged from [facility] on 01/04/22 he does not have a follow-up with PCP [name], MD within 1-2 weeks or with cardiologist Peter Martinique, MD as instructed.   Thanks! Chong Sicilian, BSN, RN-BC RN Care Coordinator [place] Direct Dial: [phone] Main #: [phone]

## 2022-01-08 NOTE — Telephone Encounter (Signed)
Called patient set up for hospital follow up.

## 2022-01-09 NOTE — Telephone Encounter (Signed)
Spoke to patient appointment scheduled with Dr.Jordan 10/25 at 9:00 am.

## 2022-01-10 ENCOUNTER — Other Ambulatory Visit: Payer: Self-pay | Admitting: Family Medicine

## 2022-01-12 NOTE — Telephone Encounter (Signed)
The following message was attached to request:  The original prescription was discontinued on 01/03/2022 by Bonnita Hollow, CPhT for the following reason: No longer needed (for PRN medications).   Spoke with pt asking if pt is still taking omeprazole 40 mg.  States he stopped it on his own b/c he doesn't need it right now.   Denied refill.

## 2022-01-14 ENCOUNTER — Encounter: Payer: Self-pay | Admitting: Family Medicine

## 2022-01-14 ENCOUNTER — Ambulatory Visit (INDEPENDENT_AMBULATORY_CARE_PROVIDER_SITE_OTHER): Payer: Medicare HMO | Admitting: Family Medicine

## 2022-01-14 VITALS — BP 124/72 | HR 96 | Temp 97.8°F | Ht 67.0 in | Wt 207.0 lb

## 2022-01-14 DIAGNOSIS — R0609 Other forms of dyspnea: Secondary | ICD-10-CM | POA: Diagnosis not present

## 2022-01-14 DIAGNOSIS — N179 Acute kidney failure, unspecified: Secondary | ICD-10-CM | POA: Diagnosis not present

## 2022-01-14 DIAGNOSIS — I10 Essential (primary) hypertension: Secondary | ICD-10-CM | POA: Diagnosis not present

## 2022-01-14 DIAGNOSIS — M79604 Pain in right leg: Secondary | ICD-10-CM

## 2022-01-14 DIAGNOSIS — E1142 Type 2 diabetes mellitus with diabetic polyneuropathy: Secondary | ICD-10-CM | POA: Diagnosis not present

## 2022-01-14 DIAGNOSIS — M79605 Pain in left leg: Secondary | ICD-10-CM

## 2022-01-14 LAB — CBC WITH DIFFERENTIAL/PLATELET
Basophils Absolute: 0 10*3/uL (ref 0.0–0.1)
Basophils Relative: 0.4 % (ref 0.0–3.0)
Eosinophils Absolute: 0.2 10*3/uL (ref 0.0–0.7)
Eosinophils Relative: 2.1 % (ref 0.0–5.0)
HCT: 42.1 % (ref 39.0–52.0)
Hemoglobin: 14.7 g/dL (ref 13.0–17.0)
Lymphocytes Relative: 12.5 % (ref 12.0–46.0)
Lymphs Abs: 1.2 10*3/uL (ref 0.7–4.0)
MCHC: 35 g/dL (ref 30.0–36.0)
MCV: 90.1 fl (ref 78.0–100.0)
Monocytes Absolute: 0.7 10*3/uL (ref 0.1–1.0)
Monocytes Relative: 7 % (ref 3.0–12.0)
Neutro Abs: 7.7 10*3/uL (ref 1.4–7.7)
Neutrophils Relative %: 78 % — ABNORMAL HIGH (ref 43.0–77.0)
Platelets: 193 10*3/uL (ref 150.0–400.0)
RBC: 4.67 Mil/uL (ref 4.22–5.81)
RDW: 14 % (ref 11.5–15.5)
WBC: 9.9 10*3/uL (ref 4.0–10.5)

## 2022-01-14 LAB — BASIC METABOLIC PANEL
BUN: 25 mg/dL — ABNORMAL HIGH (ref 6–23)
CO2: 25 mEq/L (ref 19–32)
Calcium: 9.9 mg/dL (ref 8.4–10.5)
Chloride: 104 mEq/L (ref 96–112)
Creatinine, Ser: 1.58 mg/dL — ABNORMAL HIGH (ref 0.40–1.50)
GFR: 44.02 mL/min — ABNORMAL LOW (ref >60.00)
Glucose, Bld: 229 mg/dL — ABNORMAL HIGH (ref 70–99)
Potassium: 5 mEq/L (ref 3.5–5.1)
Sodium: 135 mEq/L (ref 135–145)

## 2022-01-14 NOTE — Progress Notes (Unsigned)
Patient ID: Alan Morrison, male    DOB: June 13, 1950, 71 y.o.   MRN: [id number]  This visit was conducted in person.  BP 124/72   Pulse 96   Temp 97.8 F (36.6 C) (Temporal)   Ht '5\' 7"'$  (1.702 m)   Wt 207 lb (93.9 kg)   SpO2 97%   BMI 32.42 kg/m    CC: hosp f/u visit  Subjective:   HPI: Alan Morrison is a 71 y.o. male presenting on 01/14/2022 for Hospitalization Follow-up (Admitted on 01/02/22 at [facility], dx AKI; hyperkalemia; SOB.)   Recent hospitalization for 4 wks of worsening shortness of breath as well as 3 days of L calf pain (he states he's actually bilateral calf pain with walking). Found to be hyperkalemic K 6.3 with AKI (Cr 2.4) and elevated D dimer - started on heparin drip for presumed DVT - doppler was negative for this so heparin was stopped, CTA not obtained due to AKI. Never hypoxic. Rehydrated with improvement in kidney function. Concern for DKA - treated with insulin drip. Hyperkaelmia treated with Lokelma. Unclear cause of DKA - no missed med doses, no diet changes.  Hospital records reviewed. Med rec performed.   Since home, continues feeling sporadically short winded with exertion. This is not too bothersome., has been going on for several months. No chest pain, leg swelling, dizziness, headache, nausea, abd pain.  No further calf pain.   DM - follows with endocrinology Alan Morrison, latest A1c 7.1% (10/14/2021). He was on trulicity '3mg'$  weekly, basaglar 100u nightly, metformin '1000mg'$  daily.  Sister recently diagnosed with some lung disease - he will let me know name.   H/o MI (inferior wall STEMI) 10/2019 s/p PCI with DES to RCA - treated with DAPT aspirin/plavix x1 yr total. Now on aspirin, metoprolol, rosuvastatin, prn nitro.  H/o L femoral DVT 04/2018 s/p xarelto treatment through 11/2019. No unilateral leg swelling or dyspnea  Home health not set up.  Other follow up appointments scheduled: cardiology 02/11/2022, endocrinology  01/27/2022 ______________________________________________________________________ Hospital admission: 01/02/2022 Hospital discharge: 01/04/2022 TCM f/u phone call: completed 01/05/2022  Admitted From: Home Disposition: Home   Recommendations for Outpatient Follow-up:  Follow up with PCP in 1-2 weeks Please obtain BMP/CBC in one week Please do not take metformin and losartan for 2 days and resume on 01/06/2022. Please follow up with your PCP on the following pending results:  Discharge Diagnoses:  Principal Problem:   AKI (acute kidney injury) ([place]) Active Problems:   Dyspnea on exertion   Hyperkalemia   Elevated d-dimer   Type 2 diabetes, controlled, with peripheral neuropathy ([place])   Essential hypertension, benign   Obesity, Class I, BMI 30-34.9   History of DVT (deep vein thrombosis)   Hyperlipidemia associated with type 2 diabetes mellitus ([place])     Relevant past medical, surgical, family and social history reviewed and updated as indicated. Interim medical history since our last visit reviewed. Allergies and medications reviewed and updated. Outpatient Medications Prior to Visit  Medication Sig Dispense Refill   acetaminophen (TYLENOL) 500 MG tablet Take 1,000 mg by mouth 2 (two) times daily as needed for headache (pain).     aspirin EC 81 MG tablet Take 81 mg by mouth daily with supper. Swallow whole.     Calcium Carbonate Antacid (TUMS PO) Take 1 tablet by mouth daily as needed (acid reflux/indigestion).     Cholecalciferol (VITAMIN D) 2000 units tablet Take 2,000 Units by mouth daily with breakfast.      finasteride (  PROSCAR) 5 MG tablet TAKE 1 TABLET BY MOUTH EVERY DAY (Patient taking differently: Take 5 mg by mouth daily.) 90 tablet 3   Insulin Glargine (BASAGLAR KWIKPEN) 100 UNIT/ML Inject 100 Units into the skin at bedtime.     Insulin Pen Needle (B-D ULTRAFINE III SHORT PEN) 31G X 8 MM MISC 1 Device by Other route 4 (four) times daily. USE AS DIRECTED DAILY 120 each 11    Iron, Ferrous Sulfate, 325 (65 Fe) MG TABS Take 325 mg by mouth every other day.     losartan (COZAAR) 25 MG tablet Take 1 tablet (25 mg total) by mouth daily. 90 tablet 1   metFORMIN (GLUCOPHAGE) 1000 MG tablet Take 1 tablet (1,000 mg total) by mouth daily with breakfast. TAKE 1 TABLET BY MOUTH EVERY DAY AT BREAKFAST Strength: 1,000 mg 90 tablet 3   metoprolol tartrate (LOPRESSOR) 25 MG tablet TAKE 0.5 TABLETS BY MOUTH 2 TIMES DAILY. (Patient taking differently: Take 12.5 mg by mouth 2 (two) times daily.) 90 tablet 2   Multiple Vitamin (MULTIVITAMIN WITH MINERALS) TABS tablet Take 1 tablet by mouth daily with breakfast.      nitroGLYCERIN (NITROSTAT) 0.4 MG SL tablet PLACE 1 TABLET UNDER THE TONGUE EVERY 5 MINUTES AS NEEDED FOR CHEST PAIN 25 tablet 11   ONETOUCH ULTRA test strip USE TO CHECK SUGARS DAILY  AS DIRECTED 100 strip 3   probenecid (BENEMID) 500 MG tablet Take 0.5 tablets (250 mg total) by mouth every other day. 45 tablet 3   rosuvastatin (CRESTOR) 40 MG tablet Take 1 tablet (40 mg total) by mouth daily with supper. 90 tablet 2   tamsulosin (FLOMAX) 0.4 MG CAPS capsule Take 1 capsule (0.4 mg total) by mouth daily. 90 capsule 3   TRULICITY 3.38 SN/0.5LZ SOPN Inject 0.75 mg into the skin once a week. Sunday     No facility-administered medications prior to visit.     Per HPI unless specifically indicated in ROS section below Review of Systems  Objective:  BP 124/72   Pulse 96   Temp 97.8 F (36.6 C) (Temporal)   Ht '5\' 7"'$  (1.702 m)   Wt 207 lb (93.9 kg)   SpO2 97%   BMI 32.42 kg/m   Wt Readings from Last 3 Encounters:  01/14/22 207 lb (93.9 kg)  01/04/22 206 lb 12.8 oz (93.8 kg)  10/14/21 208 lb 2 oz (94.4 kg)      Physical Exam Vitals and nursing note reviewed.  Constitutional:      Appearance: Normal appearance. He is not ill-appearing.  HENT:     Mouth/Throat:     Mouth: Mucous membranes are moist.     Pharynx: Oropharynx is clear. No oropharyngeal exudate or  posterior oropharyngeal erythema.  Cardiovascular:     Rate and Rhythm: Normal rate and regular rhythm.     Pulses: Normal pulses.     Heart sounds: Normal heart sounds. No murmur heard. Pulmonary:     Effort: Pulmonary effort is normal. No respiratory distress.     Breath sounds: Normal breath sounds. No wheezing, rhonchi or rales.  Musculoskeletal:     Right lower leg: Edema (tr) present.     Left lower leg: Edema (tr) present.     Comments: 2+ DP bilaterally  Skin:    General: Skin is warm and dry.     Findings: No rash.  Neurological:     Mental Status: He is alert.  Psychiatric:        Mood and  Affect: Mood normal.        Behavior: Behavior normal.       Lab Results  Component Value Date   CREATININE 1.55 (H) 01/04/2022   BUN 30 (H) 01/04/2022   NA 137 01/04/2022   K 4.6 01/04/2022   CL 109 01/04/2022   CO2 21 (L) 01/04/2022    Lab Results  Component Value Date   WBC 5.8 01/04/2022   HGB 12.9 (L) 01/04/2022   HCT 36.5 (L) 01/04/2022   MCV 88.2 01/04/2022   PLT 122 (L) 01/04/2022   Lab Results  Component Value Date   DDIMER 1.35 (H) 01/02/2022   BNP - 16.8 (01/02/2022)  Assessment & Plan:   Problem List Items Addressed This Visit   None Visit Diagnoses     Exertional dyspnea    -  Primary   Relevant Orders   CBC with Differential/Platelet   Basic metabolic panel        No orders of the defined types were placed in this encounter.  Orders Placed This Encounter  Procedures   CBC with Differential/Platelet   Basic metabolic panel     Patient Instructions  Labs today  Walking oxygen test today.  Send me name of sister's lung disease.  Let me know if ongoing or worsening shortness of breath for referral to lung doctor.   Follow up plan: No follow-ups on file.  [name], MD

## 2022-01-14 NOTE — Patient Instructions (Addendum)
Labs today  Walking oxygen test today.  Send me name of sister's lung disease.  Let me know if ongoing or worsening shortness of breath for referral to lung doctor.

## 2022-01-15 ENCOUNTER — Encounter: Payer: Self-pay | Admitting: Family Medicine

## 2022-01-15 DIAGNOSIS — M79604 Pain in right leg: Secondary | ICD-10-CM | POA: Insufficient documentation

## 2022-01-15 NOTE — Assessment & Plan Note (Signed)
Followed by endo, latest A1c 7.1% (09/2021), thought to have mild DKA during latest hospitalization, but unclear if truly the case.  He notes sugar levels have recently trended higher but are now improving.  He will continue basaglar, trulicity, metformin and has endocrinology f/u planned in 2 wks.

## 2022-01-15 NOTE — Assessment & Plan Note (Addendum)
Chronic, ongoing. This initially led to hospitalization. He had elevated D dimer with normal LE venous US. Chest imaging not performed due to AKI. If ongoing and kidney function improved, consider CTA chest to r/o chronic PE as cause. Ambulatory pulse ox today normal. No known h/o COPD, ex-smoker (quit remotely).  Sister recently diagnosed with pulm fibrosis.  Consider pulmonary evaluation.

## 2022-01-15 NOTE — Assessment & Plan Note (Signed)
Update renal function.  

## 2022-01-15 NOTE — Assessment & Plan Note (Signed)
Describes bilateral exertional leg pain - this has improved. He had negative venous US to r/o DVT. He has good pedal pulses pointing against PAD as cause. If ongoing, low threshold to check ABIs.

## 2022-01-15 NOTE — Assessment & Plan Note (Signed)
Chronic, stable - continue losartan '25mg'$  daily, metoprolol 12.'5mg'$  bid, update labs.

## 2022-01-27 DIAGNOSIS — E1165 Type 2 diabetes mellitus with hyperglycemia: Secondary | ICD-10-CM | POA: Diagnosis not present

## 2022-01-27 DIAGNOSIS — I10 Essential (primary) hypertension: Secondary | ICD-10-CM | POA: Diagnosis not present

## 2022-01-27 DIAGNOSIS — Z9289 Personal history of other medical treatment: Secondary | ICD-10-CM | POA: Diagnosis not present

## 2022-01-27 DIAGNOSIS — Z794 Long term (current) use of insulin: Secondary | ICD-10-CM | POA: Diagnosis not present

## 2022-01-27 DIAGNOSIS — E1122 Type 2 diabetes mellitus with diabetic chronic kidney disease: Secondary | ICD-10-CM | POA: Diagnosis not present

## 2022-01-27 DIAGNOSIS — E1159 Type 2 diabetes mellitus with other circulatory complications: Secondary | ICD-10-CM | POA: Diagnosis not present

## 2022-01-27 DIAGNOSIS — N1831 Chronic kidney disease, stage 3a: Secondary | ICD-10-CM | POA: Diagnosis not present

## 2022-01-27 DIAGNOSIS — E782 Mixed hyperlipidemia: Secondary | ICD-10-CM | POA: Diagnosis not present

## 2022-01-28 ENCOUNTER — Encounter: Payer: Self-pay | Admitting: Family Medicine

## 2022-01-29 ENCOUNTER — Emergency Department (HOSPITAL_BASED_OUTPATIENT_CLINIC_OR_DEPARTMENT_OTHER): Payer: Medicare HMO

## 2022-01-29 ENCOUNTER — Other Ambulatory Visit: Payer: Self-pay

## 2022-01-29 ENCOUNTER — Inpatient Hospital Stay (HOSPITAL_COMMUNITY)
Admission: EM | Admit: 2022-01-29 | Discharge: 2022-01-31 | DRG: 270 | Disposition: A | Discharge status: Home / Self Care | Payer: Medicare HMO | Attending: Internal Medicine | Admitting: Internal Medicine

## 2022-01-29 ENCOUNTER — Inpatient Hospital Stay (HOSPITAL_COMMUNITY): Payer: Medicare HMO

## 2022-01-29 DIAGNOSIS — M7989 Other specified soft tissue disorders: Secondary | ICD-10-CM

## 2022-01-29 DIAGNOSIS — K219 Gastro-esophageal reflux disease without esophagitis: Secondary | ICD-10-CM | POA: Diagnosis not present

## 2022-01-29 DIAGNOSIS — R911 Solitary pulmonary nodule: Secondary | ICD-10-CM | POA: Diagnosis not present

## 2022-01-29 DIAGNOSIS — R52 Pain, unspecified: Secondary | ICD-10-CM | POA: Diagnosis not present

## 2022-01-29 DIAGNOSIS — I129 Hypertensive chronic kidney disease with stage 1 through stage 4 chronic kidney disease, or unspecified chronic kidney disease: Secondary | ICD-10-CM | POA: Diagnosis not present

## 2022-01-29 DIAGNOSIS — I2510 Atherosclerotic heart disease of native coronary artery without angina pectoris: Secondary | ICD-10-CM

## 2022-01-29 DIAGNOSIS — E785 Hyperlipidemia, unspecified: Secondary | ICD-10-CM

## 2022-01-29 DIAGNOSIS — G4733 Obstructive sleep apnea (adult) (pediatric): Secondary | ICD-10-CM | POA: Diagnosis present

## 2022-01-29 DIAGNOSIS — Z7984 Long term (current) use of oral hypoglycemic drugs: Secondary | ICD-10-CM

## 2022-01-29 DIAGNOSIS — E11649 Type 2 diabetes mellitus with hypoglycemia without coma: Secondary | ICD-10-CM | POA: Diagnosis not present

## 2022-01-29 DIAGNOSIS — Z951 Presence of aortocoronary bypass graft: Secondary | ICD-10-CM

## 2022-01-29 DIAGNOSIS — Z833 Family history of diabetes mellitus: Secondary | ICD-10-CM

## 2022-01-29 DIAGNOSIS — I82491 Acute embolism and thrombosis of other specified deep vein of right lower extremity: Secondary | ICD-10-CM | POA: Diagnosis not present

## 2022-01-29 DIAGNOSIS — N401 Enlarged prostate with lower urinary tract symptoms: Secondary | ICD-10-CM

## 2022-01-29 DIAGNOSIS — I2699 Other pulmonary embolism without acute cor pulmonale: Secondary | ICD-10-CM | POA: Diagnosis not present

## 2022-01-29 DIAGNOSIS — Z86718 Personal history of other venous thrombosis and embolism: Secondary | ICD-10-CM | POA: Diagnosis not present

## 2022-01-29 DIAGNOSIS — N138 Other obstructive and reflux uropathy: Secondary | ICD-10-CM | POA: Diagnosis not present

## 2022-01-29 DIAGNOSIS — I998 Other disorder of circulatory system: Secondary | ICD-10-CM | POA: Diagnosis not present

## 2022-01-29 DIAGNOSIS — E1122 Type 2 diabetes mellitus with diabetic chronic kidney disease: Secondary | ICD-10-CM | POA: Diagnosis not present

## 2022-01-29 DIAGNOSIS — Z79899 Other long term (current) drug therapy: Secondary | ICD-10-CM

## 2022-01-29 DIAGNOSIS — Z6831 Body mass index (BMI) 31.0-31.9, adult: Secondary | ICD-10-CM

## 2022-01-29 DIAGNOSIS — Z87891 Personal history of nicotine dependence: Secondary | ICD-10-CM

## 2022-01-29 DIAGNOSIS — I82511 Chronic embolism and thrombosis of right femoral vein: Secondary | ICD-10-CM | POA: Diagnosis not present

## 2022-01-29 DIAGNOSIS — I82411 Acute embolism and thrombosis of right femoral vein: Secondary | ICD-10-CM | POA: Diagnosis present

## 2022-01-29 DIAGNOSIS — M79604 Pain in right leg: Secondary | ICD-10-CM | POA: Diagnosis not present

## 2022-01-29 DIAGNOSIS — Z7982 Long term (current) use of aspirin: Secondary | ICD-10-CM

## 2022-01-29 DIAGNOSIS — R0602 Shortness of breath: Secondary | ICD-10-CM | POA: Diagnosis not present

## 2022-01-29 DIAGNOSIS — N1831 Chronic kidney disease, stage 3a: Secondary | ICD-10-CM | POA: Diagnosis not present

## 2022-01-29 DIAGNOSIS — Z809 Family history of malignant neoplasm, unspecified: Secondary | ICD-10-CM

## 2022-01-29 DIAGNOSIS — I82421 Acute embolism and thrombosis of right iliac vein: Principal | ICD-10-CM | POA: Diagnosis present

## 2022-01-29 DIAGNOSIS — I10 Essential (primary) hypertension: Secondary | ICD-10-CM

## 2022-01-29 DIAGNOSIS — Z955 Presence of coronary angioplasty implant and graft: Secondary | ICD-10-CM

## 2022-01-29 DIAGNOSIS — I82423 Acute embolism and thrombosis of iliac vein, bilateral: Secondary | ICD-10-CM | POA: Diagnosis not present

## 2022-01-29 DIAGNOSIS — Z7985 Long-term (current) use of injectable non-insulin antidiabetic drugs: Secondary | ICD-10-CM

## 2022-01-29 DIAGNOSIS — I252 Old myocardial infarction: Secondary | ICD-10-CM | POA: Diagnosis not present

## 2022-01-29 DIAGNOSIS — N400 Enlarged prostate without lower urinary tract symptoms: Secondary | ICD-10-CM | POA: Diagnosis not present

## 2022-01-29 DIAGNOSIS — Z794 Long term (current) use of insulin: Secondary | ICD-10-CM | POA: Diagnosis not present

## 2022-01-29 DIAGNOSIS — K760 Fatty (change of) liver, not elsewhere classified: Secondary | ICD-10-CM | POA: Diagnosis present

## 2022-01-29 DIAGNOSIS — I70201 Unspecified atherosclerosis of native arteries of extremities, right leg: Secondary | ICD-10-CM | POA: Diagnosis not present

## 2022-01-29 DIAGNOSIS — Z8249 Family history of ischemic heart disease and other diseases of the circulatory system: Secondary | ICD-10-CM

## 2022-01-29 DIAGNOSIS — I82401 Acute embolism and thrombosis of unspecified deep veins of right lower extremity: Secondary | ICD-10-CM | POA: Diagnosis not present

## 2022-01-29 DIAGNOSIS — E669 Obesity, unspecified: Secondary | ICD-10-CM | POA: Diagnosis not present

## 2022-01-29 LAB — CBC WITH DIFFERENTIAL/PLATELET
Abs Immature Granulocytes: 0.04 10*3/uL (ref 0.00–0.07)
Basophils Absolute: 0 10*3/uL (ref 0.0–0.1)
Basophils Relative: 1 %
Eosinophils Absolute: 0.2 10*3/uL (ref 0.0–0.5)
Eosinophils Relative: 2 %
HCT: 43.4 % (ref 39.0–52.0)
Hemoglobin: 15.1 g/dL (ref 13.0–17.0)
Immature Granulocytes: 1 %
Lymphocytes Relative: 16 %
Lymphs Abs: 1.4 10*3/uL (ref 0.7–4.0)
MCH: 31.1 pg (ref 26.0–34.0)
MCHC: 34.8 g/dL (ref 30.0–36.0)
MCV: 89.5 fL (ref 80.0–100.0)
Monocytes Absolute: 0.6 10*3/uL (ref 0.1–1.0)
Monocytes Relative: 7 %
Neutro Abs: 6.4 10*3/uL (ref 1.7–7.7)
Neutrophils Relative %: 73 %
Platelets: 151 10*3/uL (ref 150–400)
RBC: 4.85 MIL/uL (ref 4.22–5.81)
RDW: 13.2 % (ref 11.5–15.5)
WBC: 8.7 10*3/uL (ref 4.0–10.5)
nRBC: 0 % (ref 0.0–0.2)

## 2022-01-29 LAB — BASIC METABOLIC PANEL
Anion gap: 13 (ref 5–15)
BUN: 33 mg/dL — ABNORMAL HIGH (ref 8–23)
CO2: 19 mmol/L — ABNORMAL LOW (ref 22–32)
Calcium: 9.9 mg/dL (ref 8.9–10.3)
Chloride: 105 mmol/L (ref 98–111)
Creatinine, Ser: 1.47 mg/dL — ABNORMAL HIGH (ref 0.61–1.24)
GFR, Estimated: 51 mL/min — ABNORMAL LOW (ref >60)
Glucose, Bld: 190 mg/dL — ABNORMAL HIGH (ref 70–99)
Potassium: 4.4 mmol/L (ref 3.5–5.1)
Sodium: 137 mmol/L (ref 135–145)

## 2022-01-29 LAB — CBG MONITORING, ED: Glucose-Capillary: 190 mg/dL — ABNORMAL HIGH (ref 70–99)

## 2022-01-29 MED ORDER — ONDANSETRON HCL 4 MG/2ML IJ SOLN: 4.0000 mg | Freq: Four times a day (QID) | INTRAMUSCULAR | Status: DC | PRN

## 2022-01-29 MED ORDER — FERROUS SULFATE 325 (65 FE) MG PO TABS
325.0000 mg | ORAL_TABLET | ORAL | Status: DC
  Administered 2022-01-30: 325 mg via ORAL
  Filled 2022-01-29 (×2): qty 1

## 2022-01-29 MED ORDER — TRAZODONE HCL 50 MG PO TABS: 25.0000 mg | ORAL_TABLET | Freq: Every evening | ORAL | Status: DC | PRN

## 2022-01-29 MED ORDER — ROSUVASTATIN CALCIUM 20 MG PO TABS
40.0000 mg | ORAL_TABLET | Freq: Every day | ORAL | Status: DC
  Administered 2022-01-30: 40 mg via ORAL
  Filled 2022-01-29: qty 2

## 2022-01-29 MED ORDER — PROBENECID 500 MG PO TABS
250.0000 mg | ORAL_TABLET | ORAL | Status: DC
  Administered 2022-01-31: 250 mg via ORAL
  Filled 2022-01-29 (×2): qty 1

## 2022-01-29 MED ORDER — HEPARIN (PORCINE) 25000 UT/250ML-% IV SOLN
1200.0000 [IU]/h | INTRAVENOUS | Status: DC
  Administered 2022-01-29: 1500 [IU]/h via INTRAVENOUS
  Administered 2022-01-30: 1400 [IU]/h via INTRAVENOUS
  Filled 2022-01-29 (×3): qty 250

## 2022-01-29 MED ORDER — ONDANSETRON HCL 4 MG PO TABS: 4.0000 mg | ORAL_TABLET | Freq: Four times a day (QID) | ORAL | Status: DC | PRN

## 2022-01-29 MED ORDER — MAGNESIUM HYDROXIDE 400 MG/5ML PO SUSP: 30.0000 mL | Freq: Every day | ORAL | Status: DC | PRN

## 2022-01-29 MED ORDER — METOPROLOL TARTRATE 12.5 MG HALF TABLET
12.5000 mg | ORAL_TABLET | Freq: Two times a day (BID) | ORAL | Status: DC
  Administered 2022-01-29 – 2022-01-31 (×4): 12.5 mg via ORAL
  Filled 2022-01-29 (×4): qty 1

## 2022-01-29 MED ORDER — TAMSULOSIN HCL 0.4 MG PO CAPS
0.4000 mg | ORAL_CAPSULE | Freq: Every day | ORAL | Status: DC
  Administered 2022-01-30 – 2022-01-31 (×2): 0.4 mg via ORAL
  Filled 2022-01-29 (×2): qty 1

## 2022-01-29 MED ORDER — ASPIRIN 81 MG PO TBEC
81.0000 mg | DELAYED_RELEASE_TABLET | Freq: Every day | ORAL | Status: DC
  Administered 2022-01-30: 81 mg via ORAL
  Filled 2022-01-29: qty 1

## 2022-01-29 MED ORDER — NITROGLYCERIN 0.4 MG SL SUBL: 0.4000 mg | SUBLINGUAL_TABLET | SUBLINGUAL | Status: DC | PRN

## 2022-01-29 MED ORDER — INSULIN GLARGINE-YFGN 100 UNIT/ML ~~LOC~~ SOLN
100.0000 [IU] | Freq: Every day | SUBCUTANEOUS | Status: DC
  Administered 2022-01-30 (×2): 100 [IU] via SUBCUTANEOUS
  Filled 2022-01-29 (×3): qty 1

## 2022-01-29 MED ORDER — HEPARIN BOLUS VIA INFUSION
5000.0000 [IU] | Freq: Once | INTRAVENOUS | Status: AC
  Administered 2022-01-29: 5000 [IU] via INTRAVENOUS
  Filled 2022-01-29: qty 5000

## 2022-01-29 MED ORDER — ACETAMINOPHEN 325 MG PO TABS: 650.0000 mg | ORAL_TABLET | Freq: Four times a day (QID) | ORAL | Status: DC | PRN

## 2022-01-29 MED ORDER — OXYCODONE-ACETAMINOPHEN 5-325 MG PO TABS
1.0000 | ORAL_TABLET | Freq: Once | ORAL | Status: AC
  Administered 2022-01-29: 1 via ORAL
  Filled 2022-01-29: qty 1

## 2022-01-29 MED ORDER — LOSARTAN POTASSIUM 25 MG PO TABS
25.0000 mg | ORAL_TABLET | Freq: Every day | ORAL | Status: DC
  Administered 2022-01-29 – 2022-01-31 (×3): 25 mg via ORAL
  Filled 2022-01-29 (×3): qty 1

## 2022-01-29 MED ORDER — ADULT MULTIVITAMIN W/MINERALS CH
1.0000 | ORAL_TABLET | Freq: Every day | ORAL | Status: DC
  Administered 2022-01-30 – 2022-01-31 (×2): 1 via ORAL
  Filled 2022-01-29 (×2): qty 1

## 2022-01-29 MED ORDER — DULAGLUTIDE 0.75 MG/0.5ML ~~LOC~~ SOAJ: 0.7500 mg | SUBCUTANEOUS | Status: DC

## 2022-01-29 MED ORDER — IOHEXOL 350 MG/ML SOLN
100.0000 mL | Freq: Once | INTRAVENOUS | Status: AC | PRN
  Administered 2022-01-29: 100 mL via INTRAVENOUS

## 2022-01-29 MED ORDER — FINASTERIDE 5 MG PO TABS
5.0000 mg | ORAL_TABLET | Freq: Every day | ORAL | Status: DC
  Administered 2022-01-30 – 2022-01-31 (×2): 5 mg via ORAL
  Filled 2022-01-29 (×2): qty 1

## 2022-01-29 MED ORDER — ACETAMINOPHEN 650 MG RE SUPP: 650.0000 mg | Freq: Four times a day (QID) | RECTAL | Status: DC | PRN

## 2022-01-29 MED ORDER — CALCIUM CARBONATE ANTACID 500 MG PO CHEW
1.0000 | CHEWABLE_TABLET | Freq: Two times a day (BID) | ORAL | Status: DC
  Administered 2022-01-30: 200 mg via ORAL
  Filled 2022-01-29: qty 1

## 2022-01-29 MED ORDER — SODIUM CHLORIDE 0.9 % IV SOLN
INTRAVENOUS | Status: DC
  Administered 2022-01-30: 100 mL/h via INTRAVENOUS

## 2022-01-29 MED ORDER — VITAMIN D 25 MCG (1000 UNIT) PO TABS
2000.0000 [IU] | ORAL_TABLET | Freq: Every day | ORAL | Status: DC
  Administered 2022-01-30 – 2022-01-31 (×2): 2000 [IU] via ORAL
  Filled 2022-01-29 (×2): qty 2

## 2022-01-29 NOTE — Telephone Encounter (Addendum)
Called patient to get more information. Patient is in ED for evaluation at this time.  No further action needed at this time. He will call if any thing needed further from our office.

## 2022-01-29 NOTE — Assessment & Plan Note (Addendum)
-   This is an extensive right lower extremity DVT involving the common femoral, femoral veins, external iliac an and common iliac veins. - The patient will be admitted to a medical telemetry bed. - We will continue him on IV heparin. - Pain management will be provided. - Vascular surgery consult will be obtained. - Dr. Virl Cagey was notified by the patient and evaluated him in the ER. - He ordered CTA of the chest and CT venogram of the abdomen and pelvis that are currently pending. -The patient will be kept n.p.o. after midnight for potential thrombectomy in a.m.

## 2022-01-29 NOTE — ED Provider Notes (Addendum)
[facility] EMERGENCY DEPARTMENT Provider Note   CSN: [id number] Arrival date & time: 01/29/22  0930     History  Chief Complaint  Patient presents with   Leg Pain   Leg Swelling   Foot Pain   Foot Swelling    Alan Morrison is a 71 y.o. male.  Alan Morrison is a 71 y.o. male with history of DVT, CAD, STEMI, diabetes, hypertension, hyperlipidemia, who presents to the emergency department for evaluation of right leg pain and swelling.  He reports symptoms started 2 to 3 days ago.  He reports swelling and pain extends from his foot all the way up to his hip.  He reports pain is worse with ambulation.  He reports that he had a prior DVT but at that time only had pain and swelling in the calf, was on Xarelto but this medication was stopped 2 years ago.  He reports that he has had some intermittent shortness of breath over the past few weeks and was admitted to the hospital and evaluated for this, denies any change or worsening, no chest pain.  No recent travel or immobilization, was admitted to the hospital for 2 days last month 9/15 - 9/17.  No associated redness or skin changes to the leg, no fevers or chills.  The history is provided by the patient and the spouse.  Leg Pain Associated symptoms: no fever   Foot Pain Associated symptoms include shortness of breath. Pertinent negatives include no chest pain and no abdominal pain.       Home Medications Prior to Admission medications   Medication Sig Start Date End Date Taking? Authorizing Provider  acetaminophen (TYLENOL) 500 MG tablet Take 1,000 mg by mouth 2 (two) times daily as needed for headache (pain).    [provider]  aspirin EC 81 MG tablet Take 81 mg by mouth daily with supper. Swallow whole.    [provider]  Calcium Carbonate Antacid (TUMS PO) Take 1 tablet by mouth daily as needed (acid reflux/indigestion).    [provider]  Cholecalciferol (VITAMIN D) 2000 units tablet  Take 2,000 Units by mouth daily with breakfast.     [provider]  finasteride (PROSCAR) 5 MG tablet TAKE 1 TABLET BY MOUTH EVERY DAY Patient taking differently: Take 5 mg by mouth daily. 06/04/21   [name], MD  Insulin Glargine [facility]) 100 UNIT/ML Inject 100 Units into the skin at bedtime. 06/10/20   [name], MD  Insulin Pen Needle (B-D ULTRAFINE III SHORT PEN) 31G X 8 MM MISC 1 Device by Other route 4 (four) times daily. USE AS DIRECTED DAILY 06/09/18   Renato Shin, MD  Iron, Ferrous Sulfate, 325 (65 Fe) MG TABS Take 325 mg by mouth every other day. 06/16/21   [name], MD  losartan (COZAAR) 25 MG tablet Take 1 tablet (25 mg total) by mouth daily. 01/06/22   Darliss Cheney, MD  metFORMIN (GLUCOPHAGE) 1000 MG tablet Take 1 tablet (1,000 mg total) by mouth daily with breakfast. TAKE 1 TABLET BY MOUTH EVERY DAY AT BREAKFAST Strength: 1,000 mg 01/04/22   Darliss Cheney, MD  metoprolol tartrate (LOPRESSOR) 25 MG tablet TAKE 0.5 TABLETS BY MOUTH 2 TIMES DAILY. Patient taking differently: Take 12.5 mg by mouth 2 (two) times daily. 10/16/21   Martinique, Peter M, MD  Multiple Vitamin (MULTIVITAMIN WITH MINERALS) TABS tablet Take 1 tablet by mouth daily with breakfast.     [provider]  nitroGLYCERIN (NITROSTAT) 0.4 MG  SL tablet PLACE 1 TABLET UNDER THE TONGUE EVERY 5 MINUTES AS NEEDED FOR CHEST PAIN 05/27/21   [place]ique, Peter M, MD  [facility] ULTRA test strip USE TO CHECK SUGARS DAILY  AS DIRECTED 03/13/19   [name], MD  probenecid (BENEMID) 500 MG tablet Take 0.5 tablets (250 mg total) by mouth every other day. 10/14/21   [name], MD  rosuvastatin (CRESTOR) 40 MG tablet Take 1 tablet (40 mg total) by mouth daily with supper. 10/16/21   [place], Peter M, MD  tamsulosin (FLOMAX) 0.4 MG CAPS capsule Take 1 capsule (0.4 mg total) by mouth daily. 06/16/21   [name], MD  TRULICITY 3.97 QB/3.4LP SOPN Inject 0.75 mg into the skin once a  week. Sunday 05/26/21   [provider]      Allergies    Patient has no known allergies.    Review of Systems   Review of Systems  Constitutional:  Negative for chills and fever.  Respiratory:  Positive for shortness of breath. Negative for cough.   Cardiovascular:  Positive for leg swelling. Negative for chest pain.  Gastrointestinal:  Negative for abdominal pain, nausea and vomiting.  Skin:  Negative for wound.  Neurological:  Negative for weakness and numbness.    Physical Exam Updated Vital Signs BP 105/76   Pulse 93   Temp 97.8 F (36.6 C) (Oral)   Resp 12   Ht 5' 7.5" (1.715 m)   Wt 93 kg   SpO2 97%   BMI 31.63 kg/m  Physical Exam Vitals and nursing note reviewed.  Constitutional:      General: He is not in acute distress.    Appearance: Normal appearance. He is well-developed. He is not ill-appearing or diaphoretic.  HENT:     Head: Normocephalic and atraumatic.  Eyes:     General:        Right eye: No discharge.        Left eye: No discharge.  Cardiovascular:     Rate and Rhythm: Normal rate and regular rhythm.     Pulses: Normal pulses.     Heart sounds: Normal heart sounds.  Pulmonary:     Effort: Pulmonary effort is normal. No respiratory distress.     Breath sounds: Normal breath sounds. No wheezing or rales.     Comments: Respirations equal and unlabored, patient able to speak in full sentences, lungs clear to auscultation bilaterally  Abdominal:     General: Bowel sounds are normal. There is no distension.     Palpations: Abdomen is soft. There is no mass.     Tenderness: There is no abdominal tenderness. There is no guarding.     Comments: Abdomen soft, nondistended, nontender to palpation in all quadrants without guarding or peritoneal signs  Musculoskeletal:        General: Swelling and tenderness present.     Cervical back: Neck supple.     Right lower leg: Edema present.     Comments: Right lower extremity with swelling and tenderness  extending from the right foot up to the hip, 1+ pitting edema noted, patient with dopplerable pulses in the DP and PT, confirmed with Doppler, normal cap refill, normal coloration, skin is warm to the touch  Skin:    General: Skin is warm and dry.     Capillary Refill: Capillary refill takes less than 2 seconds.  Neurological:     Mental Status: He is alert and oriented to person, place, and time.  Coordination: Coordination normal.     Comments: Speech is clear, able to follow commands Moves extremities without ataxia, coordination intact  Psychiatric:        Mood and Affect: Mood normal.        Behavior: Behavior normal.     ED Results / Procedures / Treatments   Labs (all labs ordered are listed, but only abnormal results are displayed) Labs Reviewed  BASIC METABOLIC PANEL - Abnormal; Notable for the following components:      Result Value   CO2 19 (*)    Glucose, Bld 190 (*)    BUN 33 (*)    Creatinine, Ser 1.47 (*)    GFR, Estimated 51 (*)    All other components within normal limits  CBC WITH DIFFERENTIAL/PLATELET    EKG None  Radiology VAS [place] IVC/ILIAC (VENOUS ONLY)  Result Date: 01/29/2022 IVC/ILIAC STUDY Patient Name:  Alan Morrison  Date of Exam:   01/29/2022 Medical Rec #: [id number]      Accession #:    [id number] Date of Birth: [birth year]      Patient Gender: M Patient Age:   62 years Exam Location:  [facility] Procedure:      VAS [place] IVC/ILIAC (VENOUS ONLY) Referring Phys: Yvone Neu LE --------------------------------------------------------------------------------  Indications: Continuous flow obseved throughout the right lower extremity with              associated swelling, pain, and shortness of breath. History of left              lower extremity DVT. Limitations: Body habitus.  Comparison Study: 01-29-2022 Right lower extremity venous study. Performing Technologist: Darlin Coco RDMS, RVT  Examination Guidelines: A complete evaluation includes B-mode  imaging, spectral Doppler, color Doppler, and power Doppler as needed of all accessible portions of each vessel. Bilateral testing is considered an integral part of a complete examination. Limited examinations for reoccurring indications may be performed as noted.  IVC/Iliac Findings: +----------+------+--------+--------+    IVC    PatentThrombusComments +----------+------+--------+--------+ IVC Prox  patent                 +----------+------+--------+--------+ IVC Mid   patent                 +----------+------+--------+--------+ IVC Distalpatent                 +----------+------+--------+--------+  +-------------------+---------+-----------+---------+-----------+--------+         CIV        RT-PatentRT-ThrombusLT-PatentLT-ThrombusComments +-------------------+---------+-----------+---------+-----------+--------+ Common Iliac Prox   patent              patent                      +-------------------+---------+-----------+---------+-----------+--------+ Common Iliac Mid               acute    patent                      +-------------------+---------+-----------+---------+-----------+--------+ Common Iliac Distal            acute    patent                      +-------------------+---------+-----------+---------+-----------+--------+  +-------------------------+---------+-----------+---------+-----------+--------+            EIV           RT-PatentRT-ThrombusLT-PatentLT-ThrombusComments +-------------------------+---------+-----------+---------+-----------+--------+ External Iliac Vein Prox             acute  patent                      +-------------------------+---------+-----------+---------+-----------+--------+ External Iliac Vein Mid              acute    patent                      +-------------------------+---------+-----------+---------+-----------+--------+ External Iliac Vein                  acute    patent                       Distal                                                                    +-------------------------+---------+-----------+---------+-----------+--------+    Summary: IVC/Iliac: There is no evidence of thrombus involving the IVC. There is evidence of acute thrombus involving the right common iliac vein. There is evidence of acute thrombus involving the right external iliac vein. There is no evidence of thrombus involving the left common iliac vein. There is no evidence of thrombus involving the left external iliac vein.  *See table(s) above for measurements and observations.   Preliminary    VAS [place] LOWER EXTREMITY VENOUS (DVT) (7a-7p)  Result Date: 01/29/2022  Lower Venous DVT Study Patient Name:  Alan Morrison  Date of Exam:   01/29/2022 Medical Rec #: [id number]      Accession #:    [id number] Date of Birth: December 02, 1950      Patient Gender: M Patient Age:   25 years Exam Location:  [facility] Procedure:      VAS [place] LOWER EXTREMITY VENOUS (DVT) Referring Phys: KEN LE --------------------------------------------------------------------------------  Indications: RIGHT leg pain and swelling x2 days, history of left leg DVT, patient endorses shortness of breath and hip pain.  Anticoagulation: Not currently on anticoagulation, previously on xarelto. Limitations: Difficulty coapting veins secondary to venous pressure and overlying edema. Comparison Study: 04-29-2018 Prior bilateral lower extremity venous study was                   positive for LEFT lower extremity DVT involving the femoral,                   popliteal, profunda, and posterior tibial veins.                    01-03-2022 Most recent prior left lower extremity venous study                   was negative for DVT. Performing Technologist: Darlin Coco RDMS, RVT  Examination Guidelines: A complete evaluation includes B-mode imaging, spectral Doppler, color Doppler, and power Doppler as needed of all accessible portions of each vessel.  Bilateral testing is considered an integral part of a complete examination. Limited examinations for reoccurring indications may be performed as noted. The reflux portion of the exam is performed with the patient in reverse Trendelenburg.  +---------+---------------+---------+-----------+---------------+--------------+ RIGHT    CompressibilityPhasicitySpontaneityProperties     Thrombus Aging +---------+---------------+---------+-----------+---------------+--------------+ CFV      Partial        No       No  Unable to      Rouleaux                                                   compress due toflow--hyperacu                                             more proximal  te platelet                                                obstruction    aggregation    +---------+---------------+---------+-----------+---------------+--------------+ SFJ      Full                                                             +---------+---------------+---------+-----------+---------------+--------------+ FV Prox  Partial        No       No         Unable to                                                                 compress due to                                                           more proximal                                                             obstruction                   +---------+---------------+---------+-----------+---------------+--------------+ FV Mid   Partial        No       No         Unable to                                                                 compress due to  more proximal                                                             obstruction                   +---------+---------------+---------+-----------+---------------+--------------+ FV DistalPartial        No       No         Unable to                                                                  compress due to                                                           more proximal                                                             obstruction                   +---------+---------------+---------+-----------+---------------+--------------+ PFV      Partial        No       No         Unable to                                                                 compress due to                                                           more proximal                                                             obstruction                   +---------+---------------+---------+-----------+---------------+--------------+ POP      Full           No       No                                       +---------+---------------+---------+-----------+---------------+--------------+  PTV      Full                                                             +---------+---------------+---------+-----------+---------------+--------------+ PERO     Full                                                             +---------+---------------+---------+-----------+---------------+--------------+ Gastroc  Full                                                             +---------+---------------+---------+-----------+---------------+--------------+ Unable to compress common femoral and femoral veins secondary to more proximal obstruction and overlying edema.  +----+---------------+---------+-----------+----------+--------------+ LEFTCompressibilityPhasicitySpontaneityPropertiesThrombus Aging +----+---------------+---------+-----------+----------+--------------+ CFV Full           Yes      Yes                                 +----+---------------+---------+-----------+----------+--------------+    Summary: RIGHT: - No cystic structure found in the popliteal fossa.  - Continuous flow observed throughout the  right lower extremity veins suggestive of more proximal obstruction. IVC/iliac duplex examination performed in setting of right lower extremity findings.  LEFT: - No evidence of common femoral vein obstruction.  *See table(s) above for measurements and observations.    Preliminary     Procedures .Critical Care  Performed by: Jacqlyn Larsen, PA-C Authorized by: Jacqlyn Larsen, PA-C   Critical care provider statement:    Critical care time (minutes):  30   Critical care was time spent personally by me on the following activities:  Development of treatment plan with patient or surrogate, discussions with consultants, evaluation of patient's response to treatment, examination of patient, ordering and review of laboratory studies, ordering and review of radiographic studies, ordering and performing treatments and interventions, pulse oximetry, re-evaluation of patient's condition and review of old charts     Medications Ordered in ED Medications  oxyCODONE-acetaminophen (PERCOCET/ROXICET) 5-325 MG per tablet 1 tablet (1 tablet Oral Given 01/29/22 1701)    ED Course/ Medical Decision Making/ A&P                           Medical Decision Making Risk Prescription drug management. Decision regarding hospitalization.   72 y.o. male presents to the ED with complaints of right leg pain and swelling, this involves an extensive number of treatment options, and is a complaint that carries with it a high risk of complications and morbidity.  The differential diagnosis includes DVT, cellulitis, venous insufficiency, arterial occlusion   On arrival pt is nontoxic, vitals WNL.   Additional history obtained from wife at bedside. Previous records obtained and reviewed   I ordered medication oxycodone for pain  Lab Tests:  I Ordered, reviewed, and interpreted labs, which included: No leukocytosis  and normal hemoglobin, creatinine improved from prior, glucose 190, no other significant electrolyte  derangements  Imaging Studies ordered:  I ordered imaging studies which included venous Doppler of the right lower extremity, I independently visualized and interpreted imaging which showed DVT extending all the way up through the right common iliac vein, no IVC involvement  ED Course:   Patient with a few days of right lower extremity swelling and history of prior DVT, found to have extensive DVT extending through the right common iliac on ultrasound.  No evidence of phlegmasia or cerulea dolens on exam and patient with good palpable and dopplerable DP and PT pulses.  Given extent of DVT will discuss with vascular surgery.  I consulted vascular surgery and discussed lab and imaging findings with Dr. Virl Cagey.  He will see patient in consult, would likely benefit from thrombectomy given extent of DVT.  Dr. Virl Cagey has seen and evaluated patient, recommends medicine admission and IV heparin drip, n.p.o. at midnight with plans for procedure tomorrow.  I consulted the hospitalist service for admission, case discussed with Dr. Lodema Pilot including pertinent labs, imaging and plan, he will see and admit the patient.  Portions of this note were generated with Lobbyist. Dictation errors may occur despite best attempts at proofreading.   Final Clinical Impression(s) / ED Diagnoses Final diagnoses:  Acute deep vein thrombosis (DVT) of iliac vein of right lower extremity [facility])    Rx / DC Orders ED Discharge Orders     None         Jacqlyn Larsen, PA-C 01/29/22 1925    Jacqlyn Larsen, PA-C 01/29/22 1926    Tegeler, Gwenyth Allegra, MD 01/29/22 2140

## 2022-01-29 NOTE — ED Triage Notes (Signed)
Pt. Stated, my rt. Leg to foot started swelling yesterday and its painful. No injury

## 2022-01-29 NOTE — Progress Notes (Signed)
Lower extremity venous study AND IVC/iliac duplex study completed.  Preliminary stat results relayed to [place], [place].  See CV Proc for preliminary results report.   Darlin Coco, RDMS, RVT

## 2022-01-29 NOTE — Progress Notes (Signed)
ANTICOAGULATION CONSULT NOTE - Initial Consult  Pharmacy Consult for heparin  Indication: DVT  No Known Allergies  Patient Measurements: Height: 5' 7.5" (171.5 cm) Weight: 93 kg (205 lb) IBW/kg (Calculated) : 67.25 Heparin Dosing Weight: 86.7kg  Vital Signs: Temp: 98 F (36.7 C) (10/12 1746) Temp Source: Oral (10/12 1343) BP: 131/81 (10/12 1845) Pulse Rate: 82 (10/12 1845)  Labs: Recent Labs    01/29/22 1057  HGB 15.1  HCT 43.4  PLT 151  CREATININE 1.47*    Estimated Creatinine Clearance: 51.3 mL/min (A) (by C-G formula based on SCr of 1.47 mg/dL (H)).   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   by [place]   CAD (coronary artery disease) 2003   cath - Min Dz, EF 65%, Adm.R/O'd   Cervical spondylosis without myelopathy    Chest pain, atypical    Choroidal nevus 01/22/2011   left, yearly eye exam, no diabetic retinopathy   Diabetes mellitus type II    DVT of deep femoral vein, left ([place]) 04/29/2018   LLE DVT from proximal femoral vein to popliteal vein into proximal calf (04/29/2018) started on xarelto Heme (Magrinat) recommended continuing lifelong lower dose anticoagulant indefinitely (11/2018)   Dyspepsia    Ex-smoker    Fatty liver 02/2016   by [place]   GERD (gastroesophageal reflux disease)    Headache(784.0)    Heart murmur    Hx of   History of MRI of brain and brain stem 09/06   History of MRSA infection 12/2013   R knee boil   HLD (hyperlipidemia)    Hyperuricemia    Obesity    Sleep apnea    Stress-induced cardiomyopathy 09/27/98   WNL, EF 64%   Urosepsis 01/01-01/05/10   Hospitalization    Assessment: 19 YOM presenting with swelling in leg with pain, DVT on doppler, he is not on anticoagulation PTA, CBC wnl  Goal of  Therapy:  Heparin level 0.3-0.7 units/ml Monitor platelets by anticoagulation protocol: Yes   Plan:  Heparin 5000 units IV x 1, and gtt at 1500 units/hr F/u 6 hour heparin level F/u long term [facility] plan  Bertis Ruddy, PharmD Clinical Pharmacist ED Pharmacist Phone # [phone] 01/29/2022 7:25 PM

## 2022-01-29 NOTE — Telephone Encounter (Signed)
[facility] - Client TELEPHONE ADVICE RECORD [facility] Patient Name: Alan Morrison Gender: Male DOB: Dec 11, 1950 Age: 71 Y 80 M 16 D Return Phone Number: [phone] (Primary) Address: City/ State/ Zip: [place]  [address] Client [place] [facility] - Client Client Site [place] Provider [name] - MD Contact Type Call Who Is Calling Patient / Member / Family / Caregiver Call Type Triage / Clinical Relationship To Patient Self Return Phone Number [phone] (Primary) Chief Complaint Leg Swelling And Edema Reason for Call Symptomatic / Request for [place] states patient has been having leg pain and swelling.Patient is concerned about blood clots. Additional Comment Patient was transferred from the office. Translation No Nurse Assessment Nurse: Altamease Oiler, RN, Adriana Date/Time (Eastern Time): 01/29/2022 8:36:12 AM Confirm and document reason for call. If symptomatic, describe symptoms. ---pt reports whole leg swelling. onset yesterday. states it is from the hip to the foot. pain 8/10. was recently hospitalized d/t leg pain and told it was acute kidney failure. Does the patient have any new or worsening symptoms? ---Yes Will a triage be completed? ---Yes Related visit to physician within the last 2 weeks? ---Yes Does the PT have any chronic conditions? (i.e. diabetes, asthma, this includes High risk factors for pregnancy, etc.) ---Yes List chronic conditions. ---diabetes htn heart disease stent history of blood clots- not on blood thinners Is this a behavioral health or substance abuse call? ---No Guidelines Guideline Title Affirmed Question Affirmed Notes Nurse Date/Time Eilene Ghazi Time) Leg Swelling and Edema SEVERE leg swelling (e.g., swelling extends above knee, entire leg is swollen, weeping fluid) Altamease Oiler, RN, Adriana 01/29/2022  8:38:59 AM PLEASE NOTE: All timestamps contained within this report are represented as [place] Standard Time. CONFIDENTIALTY NOTICE: This fax transmission is intended only for the addressee. It contains information that is legally privileged, confidential or otherwise protected from use or disclosure. If you are not the intended recipient, you are strictly prohibited from reviewing, disclosing, copying using or disseminating any of this information or taking any action in reliance on or regarding this information. If you have received this fax in error, please notify us immediately by telephone so that we can arrange for its return to [place]. Phone: [phone], Toll-Free: [phone], Fax: [phone] Page: 2 of 2 Call Id: [id number] [place]. Time Eilene Ghazi Time) Disposition Final User 01/29/2022 8:43:44 AM See HCP within 4 Hours (or PCP triage) Yes Altamease Oiler, RN, Adriana Final Disposition 01/29/2022 8:43:44 AM See HCP within 4 Hours (or PCP triage) Yes Altamease Oiler, RN, Fabio Bering Caller Disagree/Comply Comply Caller Understands Yes PreDisposition Call Doctor Care Advice Given Per Guideline SEE HCP (OR PCP TRIAGE) WITHIN 4 HOURS: * IF OFFICE WILL BE OPEN: You need to be seen within the next 3 or 4 hours. Call your doctor (or NP/PA) now or as soon as the office opens. CALL BACK IF: * You become worse CARE ADVICE given per Leg Swelling and Edema (Adult) guideline. Comments User: Kizzie Fantasia, RN Date/Time Eilene Ghazi Time): 01/29/2022 8:42:47 AM no appts availble today per Mickel Baas at the office Referrals [facility] - ED

## 2022-01-29 NOTE — Consult Note (Signed)
Hospital Consult    Reason for Consult: Right iliofemoral DVT Requesting Physician: ED MRN #:  [id number]  History of Present Illness: This is a 71 y.o. male who presented with right lower extremity swelling.  Imaging demonstrated iliofemoral DVT.  Vascular surgery was called for further recommendations.  On exam, Alan Morrison is doing well, companied by his wife.  He has a history of previous DVT, for which she was taking Xarelto.  After his MI/CABG, he was placed on dual platelet therapy.  After a year on Plavix, this was discontinued, and there was a discussion of whether he should resume Xarelto.  Being that his first DVT was believed to be provoked, Xarelto was held.  Patient's current symptoms began 2 to 3 days ago and are significant for heaviness, and significant swelling and pain extending from the hip to the foot.  No motor changes, some light neuropathy at the tips of the toes which waxes and wanes.  No color changes.  No recent travel, no diagnosis of cancer, other prothrombotic state.    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   by [place]   CAD (coronary artery disease) 2003   cath - Min Dz, EF 65%, Adm.R/O'd   Cervical spondylosis without myelopathy    Chest pain, atypical    Choroidal nevus 01/22/2011   left, yearly eye exam, no diabetic retinopathy   Diabetes mellitus type II    DVT of deep femoral vein, left ([place]) 04/29/2018   LLE DVT from proximal femoral vein to popliteal vein into proximal calf (04/29/2018) started on xarelto Heme ([place]) recommended continuing lifelong lower dose anticoagulant indefinitely (11/2018)   Dyspepsia    Ex-smoker    Fatty liver 02/2016   by [place]   GERD (gastroesophageal reflux disease)    Headache(784.0)    Heart murmur    Hx  of   History of MRI of brain and brain stem 09/06   History of MRSA infection 12/2013   R knee boil   HLD (hyperlipidemia)    Hyperuricemia    Obesity    Sleep apnea    Stress-induced cardiomyopathy 09/27/98   WNL, EF 64%   Urosepsis 01/01-01/05/10   Hospitalization    Past Surgical History:  Procedure Laterality Date   CARDIAC CATHETERIZATION     COLONOSCOPY  07/2013   1 benign polyp rpt 10 yrs [name])   CORONARY THROMBECTOMY N/A 11/12/2019   Procedure: Coronary Thrombectomy;  Surgeon: Belva Crome, MD;  Location: [place] [facility];  Service: Cardiovascular;  Laterality: N/A;   CORONARY/GRAFT ACUTE MI REVASCULARIZATION N/A 11/12/2019   Procedure: Coronary/Graft Acute MI Revascularization;  Surgeon: Belva Crome, MD;  Location: [place] [facility];  Service: Cardiovascular;  Laterality: N/A;   KNEE ARTHROSCOPY Left 1988   KNEE SURGERY Right 05/21/03   Right, Dr. Marlou Sa, med meniscus tear via MRI   LEFT HEART CATH AND CORONARY ANGIOGRAPHY N/A 04/01/2017   Procedure: LEFT HEART CATH AND CORONARY ANGIOGRAPHY;  Surgeon: Martinique, Peter M, MD;  Location: [place] [facility];  Service: Cardiovascular;  Laterality: N/A;   LEFT HEART CATH AND CORONARY ANGIOGRAPHY N/A 11/12/2019   Procedure: LEFT HEART CATH AND CORONARY ANGIOGRAPHY;  Surgeon: Belva Crome, MD;  Location: [place] [facility];  Service:  Cardiovascular;  Laterality: N/A;   MYELOGRAM  08/05   bulging disc    No Known Allergies  Prior to Admission medications   Medication Sig Start Date End Date Taking? Authorizing Provider  acetaminophen (TYLENOL) 500 MG tablet Take 1,000 mg by mouth 2 (two) times daily as needed for headache (pain).    [provider]  aspirin EC 81 MG tablet Take 81 mg by mouth daily with supper. Swallow whole.    [provider]  Calcium Carbonate Antacid (TUMS PO) Take 1 tablet by mouth daily as needed (acid reflux/indigestion).    [provider]  Cholecalciferol (VITAMIN D)  2000 units tablet Take 2,000 Units by mouth daily with breakfast.     [provider]  finasteride (PROSCAR) 5 MG tablet TAKE 1 TABLET BY MOUTH EVERY DAY Patient taking differently: Take 5 mg by mouth daily. 06/04/21   [name], MD  Insulin Glargine [facility]) 100 UNIT/ML Inject 100 Units into the skin at bedtime. 06/10/20   [name], MD  Insulin Pen Needle (B-D ULTRAFINE III SHORT PEN) 31G X 8 MM MISC 1 Device by Other route 4 (four) times daily. USE AS DIRECTED DAILY 06/09/18   Renato Shin, MD  Iron, Ferrous Sulfate, 325 (65 Fe) MG TABS Take 325 mg by mouth every other day. 06/16/21   [name], MD  losartan (COZAAR) 25 MG tablet Take 1 tablet (25 mg total) by mouth daily. 01/06/22   Darliss Cheney, MD  metFORMIN (GLUCOPHAGE) 1000 MG tablet Take 1 tablet (1,000 mg total) by mouth daily with breakfast. TAKE 1 TABLET BY MOUTH EVERY DAY AT BREAKFAST Strength: 1,000 mg 01/04/22   Darliss Cheney, MD  metoprolol tartrate (LOPRESSOR) 25 MG tablet TAKE 0.5 TABLETS BY MOUTH 2 TIMES DAILY. Patient taking differently: Take 12.5 mg by mouth 2 (two) times daily. 10/16/21   Martinique, Peter M, MD  Multiple Vitamin (MULTIVITAMIN WITH MINERALS) TABS tablet Take 1 tablet by mouth daily with breakfast.     [provider]  nitroGLYCERIN (NITROSTAT) 0.4 MG SL tablet PLACE 1 TABLET UNDER THE TONGUE EVERY 5 MINUTES AS NEEDED FOR CHEST PAIN 05/27/21   Martinique, Peter M, MD  [facility] ULTRA test strip USE TO CHECK SUGARS DAILY  AS DIRECTED 03/13/19   [name], MD  probenecid (BENEMID) 500 MG tablet Take 0.5 tablets (250 mg total) by mouth every other day. 10/14/21   [name], MD  rosuvastatin (CRESTOR) 40 MG tablet Take 1 tablet (40 mg total) by mouth daily with supper. 10/16/21   Martinique, Peter M, MD  tamsulosin (FLOMAX) 0.4 MG CAPS capsule Take 1 capsule (0.4 mg total) by mouth daily. 06/16/21   [name], MD  TRULICITY 2.11 HE/1.7EY SOPN Inject 0.75 mg into  the skin once a week. Sunday 05/26/21   [provider]    Social History   Socioeconomic History   Marital status: Married    Spouse name: Not on file   Number of children: 2   Years of education: 12   Highest education level: High school graduate  Occupational History   Occupation: Maintenance    Employer: [employer]  Tobacco Use   Smoking status: Former    Types: Cigarettes    Quit date: 04/20/1974    Years since quitting: 47.8   Smokeless tobacco: Never   Tobacco comments:    quit over 20 years  Vaping Use   Vaping Use: Never used  Substance and Sexual Activity   Alcohol  use: Yes    Alcohol/week: 3.0 standard drinks of alcohol    Types: 3 Cans of beer per week    Comment: occasional   Drug use: No   Sexual activity: Not on file  Other Topics Concern   Not on file  Social History Narrative   Lives with wife   Occupation: equipment maintenance   Activity: no regular exercise - hunts   Diet: good water, fruits/vegetables   Social Determinants of Health   Financial Resource Strain: Low Risk  (01/05/2022)   Overall Financial Resource Strain (CARDIA)    Difficulty of Paying Living Expenses: Not hard at all  Food Insecurity: No Food Insecurity (06/05/2021)   Hunger Vital Sign    Worried About Running Out of Food in the Last Year: Never true    [place] in the Last Year: Never true  Transportation Needs: No Transportation Needs (01/05/2022)   PRAPARE - Hydrologist (Medical): No    Lack of Transportation (Non-Medical): No  Physical Activity: Inactive (06/05/2021)   Exercise Vital Sign    Days of Exercise per Week: 0 days    Minutes of Exercise per Session: 0 min  Stress: No Stress Concern Present (06/05/2021)   [place]    Feeling of Stress : Not at all  Social Connections: Moderately Isolated (06/05/2021)   Social Connection and Isolation Panel  [NHANES]    Frequency of Communication with Friends and Family: More than three times a week    Frequency of Social Gatherings with Friends and Family: More than three times a week    Attends Religious Services: Never    Marine scientist or Organizations: No    Attends Archivist Meetings: Never    Marital Status: Married  Human resources officer Violence: Not At Risk (06/05/2021)   Humiliation, Afraid, Rape, and Kick questionnaire    Fear of Current or Ex-Partner: No    Emotionally Abused: No    Physically Abused: No    Sexually Abused: No    Family History  Problem Relation Age of Onset   Heart disease Mother        CHF   Diabetes Mother    Heart disease Father        CHF   Hypertension Father    Pulmonary fibrosis Sister    Sjogren's syndrome Sister    Migraines Brother        severe headaches from arsenic in the past from  wood that was treated on his deck   CAD Brother 54       stent   Cancer Maternal Uncle        unsure   Stroke Neg Hx    Colon cancer Neg Hx     ROS: Otherwise negative unless mentioned in HPI  Physical Examination  Vitals:   01/29/22 1815 01/29/22 1845  BP: 110/76 131/81  Pulse: 87 82  Resp: 15 16  Temp:    SpO2: 92% 96%   Body mass index is 31.63 kg/m.  General:  WDWN in NAD Gait: Not observed HENT: WNL, normocephalic Pulmonary: normal non-labored breathing, without Rales, rhonchi,  wheezing Cardiac: regula Abdomen: soft, NT/ND, no masses Skin: without rashes Vascular Exam/Pulses: 2+ Dps bilaterally Extremities: without ischemic changes, without Gangrene , without cellulitis; without open wounds; significant right-sided edema Musculoskeletal: no muscle wasting or atrophy  Neurologic: A&O X 3;  No focal weakness or paresthesias are  detected; speech is fluent/normal Psychiatric:  The pt has Normal affect. Lymph:  Unremarkable  CBC    Component Value Date/Time   WBC 8.7 01/29/2022 1057   RBC 4.85 01/29/2022 1057   HGB  15.1 01/29/2022 1057   HGB 14.6 06/28/2018 0849   HGB 15.6 03/25/2017 1225   HCT 43.4 01/29/2022 1057   HCT 45.8 03/25/2017 1225   PLT 151 01/29/2022 1057   PLT 200 06/28/2018 0849   PLT 228 03/25/2017 1225   MCV 89.5 01/29/2022 1057   MCV 90 03/25/2017 1225   MCH 31.1 01/29/2022 1057   MCHC 34.8 01/29/2022 1057   RDW 13.2 01/29/2022 1057   RDW 13.6 03/25/2017 1225   LYMPHSABS 1.4 01/29/2022 1057   MONOABS 0.6 01/29/2022 1057   EOSABS 0.2 01/29/2022 1057   BASOSABS 0.0 01/29/2022 1057    BMET    Component Value Date/Time   NA 137 01/29/2022 1057   NA 138 03/25/2017 1225   K 4.4 01/29/2022 1057   CL 105 01/29/2022 1057   CO2 19 (L) 01/29/2022 1057   GLUCOSE 190 (H) 01/29/2022 1057   BUN 33 (H) 01/29/2022 1057   BUN 29 (H) 03/25/2017 1225   CREATININE 1.47 (H) 01/29/2022 1057   CREATININE 1.27 (H) 11/01/2018 0807   CALCIUM 9.9 01/29/2022 1057   GFRNONAA 51 (L) 01/29/2022 1057   GFRNONAA 58 (L) 11/01/2018 0807   GFRAA >60 11/20/2019 1215   GFRAA >60 11/01/2018 0807    COAGS: Lab Results  Component Value Date   INR 1.2 11/12/2019   INR 1.0 03/25/2017     Non-Invasive Vascular Imaging:    Right-sided iliofemoral DVT   ASSESSMENT/PLAN: This is a 71 y.o. male presenting with unprovoked right-sided iliofemoral DVT.  This is his second DVT.  On exam, he had significant pain and swelling.  Ambulation is limited due to the intensity of the pain.  I had a long discussion with Alan Morrison regarding the above.  We discussed mechanical thrombectomy as a treatment modality to decrease the thrombotic burden in his right iliac and femoral veins.  We discussed doing this from a prone position through the popliteal vein.  After discussing the risk and benefits, Alan Morrison elected to proceed.  I will add him to the schedule for tomorrow.  I have ordered a CT angio chest, CT venogram of the abdomen pelvis for preoperative planning  Please initiate a heparin drip N.p.o. midnight   Cassandria Santee MD [facility] [phone] 01/29/2022  7:37 PM

## 2022-01-29 NOTE — Assessment & Plan Note (Signed)
-   We will continue Flomax 

## 2022-01-29 NOTE — Telephone Encounter (Signed)
Plz triage pt.  °

## 2022-01-29 NOTE — ED Provider Triage Note (Signed)
Emergency Medicine Provider Triage Evaluation Note  Alan Morrison , a 71 y.o. male  was evaluated in triage.  Pt complains of right leg pain and swelling.  Patient states he has had swelling and pain on his right leg since yesterday.  He is ambulatory with difficulty.  Had a history of DVT on the left leg.  Put on Plavix and then switched to Xarelto.  He has not taking any blood thinners in the last couple years.  States he does have a shortness of breath without chest pain.  Had a history of ACS.  Denies recent travel, recent surgery or immobilization.  Denies fever, headache, dizziness, nausea, vomiting, bowel changes, urinary symptoms, rash.  Review of Systems  Positive: See above Negative: See above  Physical Exam  BP 120/78 (BP Location: Right Arm)   Pulse (!) 105   Temp 97.7 F (36.5 C) (Oral)   Resp 18   Ht 5' 7.5" (1.715 m)   Wt 93 kg   SpO2 96%   BMI 31.63 kg/m  Gen:   Awake, no distress     Resp:  Normal effort  MSK:   Swelling and redness on right leg.  1+ pulse right dorsalis pedis. Other:    Medical Decision Making  Medically screening exam initiated at 10:41 AM.  Appropriate orders placed.  Alan Morrison was informed that the remainder of the evaluation will be completed by another provider, this initial triage assessment does not replace that evaluation, and the importance of remaining in the ED until their evaluation is complete.  Work-up initiated.   Rex Kras, PA 01/29/22 1050

## 2022-01-29 NOTE — Assessment & Plan Note (Signed)
-   We we will continue beta-blocker  therapy, statin therapy, aspirin and ARB therapy as well as parents about nitroglycerin.

## 2022-01-29 NOTE — Progress Notes (Signed)
I was contacted by Dr. Brett Fairy for the chest CTA results that revealed bilateral pulmonary emboli with RV strain with moderate thrombus burden and RV/LV ratio of 1.17.  A 2D echo will be obtained and a vascular surgery consult was already obtained as mentioned above.  The study was ordered by Dr. Unk Lightning.  The patient is already on IV heparin.

## 2022-01-29 NOTE — Assessment & Plan Note (Addendum)
-   We will continue his antihypertensives. 

## 2022-01-29 NOTE — Assessment & Plan Note (Signed)
-   We will continue statin therapy. 

## 2022-01-29 NOTE — Assessment & Plan Note (Signed)
-   The patient will be placed on supplemental coverage with NovoLog. - We will continue basal coverage. - We will hold off metformin.

## 2022-01-29 NOTE — Assessment & Plan Note (Addendum)
-   This is fairly stable. - The patient will be placed on hydration with IV normal saline.

## 2022-01-29 NOTE — H&P (Signed)
[place]   PATIENT NAME: Alan Morrison    MR#:  [id number]  DATE OF BIRTH:  [birth year]  DATE OF ADMISSION:  01/29/2022  PRIMARY CARE PHYSICIAN: [name], MD   Patient is coming from: Home  REQUESTING/REFERRING PHYSICIAN: Jacqlyn Larsen, PA-C   CHIEF COMPLAINT:  Jacqlyn Larsen, PA-C  Chief Complaint  Patient presents with   Leg Pain   Leg Swelling   Foot Pain   Foot Swelling    HISTORY OF PRESENT ILLNESS:  Jamond Neels is a 71 y.o. Caucasian male with medical history significant for coronary artery disease, type II diabetes mellitus, left lower extremity DVT, GERD, dyslipidemia, OSA, and fatty liver, who presented to the emergency room with acute onset of worsening right lower extremity swelling over the last couple of days which has become more diffuse with tense swelling of the thigh and leg and worsening pain.  The patient has a history of left lower extremity DVT about 3 years ago for which she took Xarelto for a year and stopped it a couple of years ago.  He has mild dyspnea however without worsening lately.  He denies any cough or wheezing or hemoptysis.  No recent travels or surgeries.  He stated that his first left lower extremity DVT was unprovoked as well.  He denied any lower extremity paresthesias or focal muscle weakness.  He denies any bleeding diathesis.  No chest pain or palpitations.  No dysuria, oliguria or hematuria or flank pain.  No melena or blood in urine per rectum.  No headache or dizziness or blurred vision.  Denies any nausea or vomiting or abdominal pain.  ED Course: When he came to the ER, vital signs were within normal.  Labs revealed BUN of 33 and creatinine 1.47 comparable to levels on 9/27.  CBC was within normal.  EKG as reviewed by me : Pending. Imaging: Right lower extremity venous Doppler revealed continuous flow observed throughout the right lower extremity veins suggesting more proximal obstruction.  IVC/iliac duplex examination  performed in setting of right lower extremity findings.  Left lower extremity venous Doppler showed no evidence for DVT.  There was no evidence for thrombus involving the IVC.  There was evidence of acute thrombus involving the right common iliac vein, the right external iliac vein and suspected extension to the common femoral and femoral veins.  There was no evidence of thrombus involving the left common iliac vein and no evidence for thrombus involving the left external iliac vein.  The patient was given IV heparin bolus and drip.  Dr. Unk Lightning was notified about the patient and assessed him in the ER.  The patient will be admitted to a medical telemetry bed for further evaluation and management. 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   by [place]   CAD (coronary artery disease) 2003   cath - Min Dz, EF 65%, Adm.R/O'd   Cervical spondylosis without myelopathy    Chest pain, atypical    Choroidal nevus 01/22/2011   left, yearly eye exam, no diabetic retinopathy   Diabetes mellitus type II    DVT of deep femoral vein, left ([place]) 04/29/2018   LLE DVT from proximal femoral vein to popliteal vein into  proximal calf (04/29/2018) started on xarelto Heme ([place]) recommended continuing lifelong lower dose anticoagulant indefinitely (11/2018)   Dyspepsia    Ex-smoker    Fatty liver 02/2016   by [place]   GERD (gastroesophageal reflux disease)    Headache(784.0)    Heart murmur    Hx of   History of MRI of brain and brain stem 09/06   History of MRSA infection 12/2013   R knee boil   HLD (hyperlipidemia)    Hyperuricemia    Obesity    Sleep apnea    Stress-induced cardiomyopathy 09/27/98   WNL, EF 64%   Urosepsis 01/01-01/05/10   Hospitalization    PAST  SURGICAL HISTORY:   Past Surgical History:  Procedure Laterality Date   CARDIAC CATHETERIZATION     COLONOSCOPY  07/2013   1 benign polyp rpt 10 yrs [name])   CORONARY THROMBECTOMY N/A 11/12/2019   Procedure: Coronary Thrombectomy;  Surgeon: Belva Crome, MD;  Location: [facility];  Service: Cardiovascular;  Laterality: N/A;   CORONARY/GRAFT ACUTE MI REVASCULARIZATION N/A 11/12/2019   Procedure: Coronary/Graft Acute MI Revascularization;  Surgeon: Belva Crome, MD;  Location: [place] [facility];  Service: Cardiovascular;  Laterality: N/A;   KNEE ARTHROSCOPY Left 1988   KNEE SURGERY Right 05/21/03   Right, Dr. Marlou Sa, med meniscus tear via MRI   LEFT HEART CATH AND CORONARY ANGIOGRAPHY N/A 04/01/2017   Procedure: LEFT HEART CATH AND CORONARY ANGIOGRAPHY;  Surgeon: Martinique, Peter M, MD;  Location: [facility] [facility];  Service: Cardiovascular;  Laterality: N/A;   LEFT HEART CATH AND CORONARY ANGIOGRAPHY N/A 11/12/2019   Procedure: LEFT HEART CATH AND CORONARY ANGIOGRAPHY;  Surgeon: Belva Crome, MD;  Location: [facility];  Service: Cardiovascular;  Laterality: N/A;   MYELOGRAM  08/05   bulging disc    SOCIAL HISTORY:   Social History   Tobacco Use   Smoking status: Former    Types: Cigarettes    Quit date: 04/20/1974    Years since quitting: 47.8   Smokeless tobacco: Never   Tobacco comments:    quit over 20 years  Substance Use Topics   Alcohol use: Yes    Alcohol/week: 3.0 standard drinks of alcohol    Types: 3 Cans of beer per week    Comment: occasional    FAMILY HISTORY:   Family History  Problem Relation Age of Onset   Heart disease Mother        CHF   Diabetes Mother    Heart disease Father        CHF   Hypertension Father    Pulmonary fibrosis Sister    Sjogren's syndrome Sister    Migraines Brother        severe headaches from arsenic in the past from  wood that was treated on his deck   CAD Brother 17       stent   Cancer Maternal Uncle         unsure   Stroke Neg Hx    Colon cancer Neg Hx     DRUG ALLERGIES:  No Known Allergies  REVIEW OF SYSTEMS:   ROS As per history of present illness. All pertinent systems were reviewed above. Constitutional, HEENT, cardiovascular, respiratory, GI, GU, musculoskeletal, neuro, psychiatric, endocrine, integumentary and hematologic systems were reviewed and are otherwise negative/unremarkable except for positive findings mentioned above in the HPI.   MEDICATIONS AT HOME:   Prior to Admission medications   Medication Sig  Start Date End Date Taking? Authorizing Provider  acetaminophen (TYLENOL) 500 MG tablet Take 1,000 mg by mouth 2 (two) times daily as needed for headache (pain).    [provider]  aspirin EC 81 MG tablet Take 81 mg by mouth daily with supper. Swallow whole.    [provider]  Calcium Carbonate Antacid (TUMS PO) Take 1 tablet by mouth daily as needed (acid reflux/indigestion).    [provider]  Cholecalciferol (VITAMIN D) 2000 units tablet Take 2,000 Units by mouth daily with breakfast.     [provider]  finasteride (PROSCAR) 5 MG tablet TAKE 1 TABLET BY MOUTH EVERY DAY Patient taking differently: Take 5 mg by mouth daily. 06/04/21   [name], MD  Insulin Glargine [facility]) 100 UNIT/ML Inject 100 Units into the skin at bedtime. 06/10/20   [name], MD  Insulin Pen Needle (B-D ULTRAFINE III SHORT PEN) 31G X 8 MM MISC 1 Device by Other route 4 (four) times daily. USE AS DIRECTED DAILY 06/09/18   Renato Shin, MD  Iron, Ferrous Sulfate, 325 (65 Fe) MG TABS Take 325 mg by mouth every other day. 06/16/21   [name], MD  losartan (COZAAR) 25 MG tablet Take 1 tablet (25 mg total) by mouth daily. 01/06/22   Darliss Cheney, MD  metFORMIN (GLUCOPHAGE) 1000 MG tablet Take 1 tablet (1,000 mg total) by mouth daily with breakfast. TAKE 1 TABLET BY MOUTH EVERY DAY AT BREAKFAST Strength: 1,000 mg 01/04/22    Darliss Cheney, MD  metoprolol tartrate (LOPRESSOR) 25 MG tablet TAKE 0.5 TABLETS BY MOUTH 2 TIMES DAILY. Patient taking differently: Take 12.5 mg by mouth 2 (two) times daily. 10/16/21   Martinique, Peter M, MD  Multiple Vitamin (MULTIVITAMIN WITH MINERALS) TABS tablet Take 1 tablet by mouth daily with breakfast.     [provider]  nitroGLYCERIN (NITROSTAT) 0.4 MG SL tablet PLACE 1 TABLET UNDER THE TONGUE EVERY 5 MINUTES AS NEEDED FOR CHEST PAIN 05/27/21   Martinique, Peter M, MD  [facility] ULTRA test strip USE TO CHECK SUGARS DAILY  AS DIRECTED 03/13/19   [name], MD  probenecid (BENEMID) 500 MG tablet Take 0.5 tablets (250 mg total) by mouth every other day. 10/14/21   [name], MD  rosuvastatin (CRESTOR) 40 MG tablet Take 1 tablet (40 mg total) by mouth daily with supper. 10/16/21   Martinique, Peter M, MD  tamsulosin (FLOMAX) 0.4 MG CAPS capsule Take 1 capsule (0.4 mg total) by mouth daily. 06/16/21   [name], MD  TRULICITY 7.16 RC/7.8LF SOPN Inject 0.75 mg into the skin once a week. Sunday 05/26/21   [provider]      VITAL SIGNS:  Blood pressure 121/69, pulse 92, temperature 98 F (36.7 C), resp. rate 16, height 5' 7.5" (1.715 m), weight 93 kg, SpO2 96 %.  PHYSICAL EXAMINATION:  Physical Exam  GENERAL:  71 y.o.-year-old Caucasian male patient lying in the bed with no acute distress.  EYES: Pupils equal, round, reactive to light and accommodation. No scleral icterus. Extraocular muscles intact.  HEENT: Head atraumatic, normocephalic. Oropharynx and nasopharynx clear.  NECK:  Supple, no jugular venous distention. No thyroid enlargement, no tenderness.  LUNGS: Normal breath sounds bilaterally, no wheezing, rales,rhonchi or crepitation. No use of accessory muscles of respiration.  CARDIOVASCULAR: Regular rate and rhythm, S1, S2 normal. No murmurs, rubs, or gallops.  ABDOMEN: Soft, nondistended, nontender. Bowel sounds present. No organomegaly or mass.   EXTREMITIES: Diffuse right lower extremity swelling with  no cyanosis, or clubbing.  NEUROLOGIC: Cranial nerves II through XII are intact. Muscle strength 5/5 in all extremities. Sensation intact. Gait not checked.  PSYCHIATRIC: The patient is alert and oriented x 3.  Normal affect and good eye contact. SKIN: No obvious rash, lesion, or ulcer.   LABORATORY PANEL:   CBC Recent Labs  Lab 01/29/22 1057  WBC 8.7  HGB 15.1  HCT 43.4  PLT 151   ------------------------------------------------------------------------------------------------------------------  Chemistries  Recent Labs  Lab 01/29/22 1057  NA 137  K 4.4  CL 105  CO2 19*  GLUCOSE 190*  BUN 33*  CREATININE 1.47*  CALCIUM 9.9   ------------------------------------------------------------------------------------------------------------------  Cardiac Enzymes No results for input(s): "TROPONINI" in the last 168 hours. ------------------------------------------------------------------------------------------------------------------  RADIOLOGY:  VAS [place] IVC/ILIAC (VENOUS ONLY)  Result Date: 01/29/2022 IVC/ILIAC STUDY Patient Name:  Termaine Roupp  Date of Exam:   01/29/2022 Medical Rec #: [id number]      Accession #:    [id number] Date of Birth: 11/21/1950      Patient Gender: M Patient Age:   79 years Exam Location:  [facility] Procedure:      VAS [place] IVC/ILIAC (VENOUS ONLY) Referring Phys: Yvone Neu LE --------------------------------------------------------------------------------  Indications: Continuous flow obseved throughout the right lower extremity with              associated swelling, pain, and shortness of breath. History of left              lower extremity DVT. Limitations: Body habitus.  Comparison Study: 01-29-2022 Right lower extremity venous study. Performing Technologist: Darlin Coco RDMS, RVT  Examination Guidelines: A complete evaluation includes B-mode imaging, spectral Doppler, color Doppler, and power  Doppler as needed of all accessible portions of each vessel. Bilateral testing is considered an integral part of a complete examination. Limited examinations for reoccurring indications may be performed as noted.  IVC/Iliac Findings: +----------+------+--------+--------+    IVC    PatentThrombusComments +----------+------+--------+--------+ IVC Prox  patent                 +----------+------+--------+--------+ IVC Mid   patent                 +----------+------+--------+--------+ IVC Distalpatent                 +----------+------+--------+--------+  +-------------------+---------+-----------+---------+-----------+--------+         CIV        RT-PatentRT-ThrombusLT-PatentLT-ThrombusComments +-------------------+---------+-----------+---------+-----------+--------+ Common Iliac Prox   patent              patent                      +-------------------+---------+-----------+---------+-----------+--------+ Common Iliac Mid               acute    patent                      +-------------------+---------+-----------+---------+-----------+--------+ Common Iliac Distal            acute    patent                      +-------------------+---------+-----------+---------+-----------+--------+  +-------------------------+---------+-----------+---------+-----------+--------+            EIV           RT-PatentRT-ThrombusLT-PatentLT-ThrombusComments +-------------------------+---------+-----------+---------+-----------+--------+ External Iliac Vein Prox             acute    patent                      +-------------------------+---------+-----------+---------+-----------+--------+  External Iliac Vein Mid              acute    patent                      +-------------------------+---------+-----------+---------+-----------+--------+ External Iliac Vein                  acute    patent                      Distal                                                                     +-------------------------+---------+-----------+---------+-----------+--------+  Summary: IVC/Iliac: There is no evidence of thrombus involving the IVC. There is evidence of acute thrombus involving the right common iliac vein. There is evidence of acute thrombus involving the right external iliac vein. There is no evidence of thrombus involving the left common iliac vein. There is no evidence of thrombus involving the left external iliac vein.  *See table(s) above for measurements and observations.  Electronically signed by Orlie Pollen on 01/29/2022 at 8:07:40 PM.    Final    VAS [place] LOWER EXTREMITY VENOUS (DVT) (7a-7p)  Result Date: 01/29/2022  Lower Venous DVT Study Patient Name:  KEETON KASSEBAUM  Date of Exam:   01/29/2022 Medical Rec #: [id number]      Accession #:    [id number] Date of Birth: Feb 03, 1951      Patient Gender: M Patient Age:   50 years Exam Location:  [facility] Procedure:      VAS [place] LOWER EXTREMITY VENOUS (DVT) Referring Phys: KEN LE --------------------------------------------------------------------------------  Indications: RIGHT leg pain and swelling x2 days, history of left leg DVT, patient endorses shortness of breath and hip pain.  Anticoagulation: Not currently on anticoagulation, previously on xarelto. Limitations: Difficulty coapting veins secondary to venous pressure and overlying edema. Comparison Study: 04-29-2018 Prior bilateral lower extremity venous study was                   positive for LEFT lower extremity DVT involving the femoral,                   popliteal, profunda, and posterior tibial veins.                    01-03-2022 Most recent prior left lower extremity venous study                   was negative for DVT. Performing Technologist: Darlin Coco RDMS, RVT  Examination Guidelines: A complete evaluation includes B-mode imaging, spectral Doppler, color Doppler, and power Doppler as needed of all accessible portions of each  vessel. Bilateral testing is considered an integral part of a complete examination. Limited examinations for reoccurring indications may be performed as noted. The reflux portion of the exam is performed with the patient in reverse Trendelenburg.  +---------+---------------+---------+-----------+---------------+--------------+ RIGHT    CompressibilityPhasicitySpontaneityProperties     Thrombus Aging +---------+---------------+---------+-----------+---------------+--------------+ CFV      Partial        No       No         Unable to  [place]aux                                                   compress due toflow--hyperacu                                             more proximal  te platelet                                                obstruction    aggregation    +---------+---------------+---------+-----------+---------------+--------------+ SFJ      Full                                                             +---------+---------------+---------+-----------+---------------+--------------+ FV Prox  Partial        No       No         Unable to                                                                 compress due to                                                           more proximal                                                             obstruction                   +---------+---------------+---------+-----------+---------------+--------------+ FV Mid   Partial        No       No         Unable to                                                                 compress due to  more proximal                                                             obstruction                   +---------+---------------+---------+-----------+---------------+--------------+ FV DistalPartial        No       No         Unable to                                                                  compress due to                                                           more proximal                                                             obstruction                   +---------+---------------+---------+-----------+---------------+--------------+ PFV      Partial        No       No         Unable to                                                                 compress due to                                                           more proximal                                                             obstruction                   +---------+---------------+---------+-----------+---------------+--------------+ POP      Full           No       No                                       +---------+---------------+---------+-----------+---------------+--------------+  PTV      Full                                                             +---------+---------------+---------+-----------+---------------+--------------+ PERO     Full                                                             +---------+---------------+---------+-----------+---------------+--------------+ Gastroc  Full                                                             +---------+---------------+---------+-----------+---------------+--------------+ Unable to compress common femoral and femoral veins secondary to more proximal obstruction and overlying edema.  +----+---------------+---------+-----------+----------+--------------+ LEFTCompressibilityPhasicitySpontaneityPropertiesThrombus Aging +----+---------------+---------+-----------+----------+--------------+ CFV Full           Yes      Yes                                 +----+---------------+---------+-----------+----------+--------------+     Summary: RIGHT: - No cystic structure found in the popliteal fossa.  - Continuous flow observed  throughout the right lower extremity veins suggestive of more proximal obstruction. IVC/iliac duplex examination performed in setting of right lower extremity findings.  LEFT: - No evidence of common femoral vein obstruction.  *See table(s) above for measurements and observations. Electronically signed by Orlie Pollen on 01/29/2022 at 39:07:32 PM.    Final       IMPRESSION AND PLAN:  Assessment and Plan: * Acute deep vein thrombosis (DVT) of iliac vein of right lower extremity ([place]) - This is an extensive right lower extremity DVT involving the common femoral, femoral veins, external iliac an and common iliac veins. - The patient will be admitted to a medical telemetry bed. - We will continue him on IV heparin. - Pain management will be provided. - Vascular surgery consult will be obtained. - Dr. Virl Cagey was notified by the patient and evaluated him in the ER. - He ordered CTA of the chest and CT venogram of the abdomen and pelvis that are currently pending. -The patient will be kept n.p.o. after midnight for potential thrombectomy in a.m.  Type 2 diabetes mellitus with chronic kidney disease, with long-term current use of insulin ([place]) - The patient will be placed on supplemental coverage with NovoLog. - We will continue basal coverage. - We will hold off metformin.  Coronary artery disease - We we will continue beta-blocker  therapy, statin therapy, aspirin and ARB therapy as well as parents about nitroglycerin.  Dyslipidemia - We will continue statin therapy.  Essential hypertension - We will continue his antihypertensives.  BPH (benign prostatic hyperplasia) We will continue Flomax  Chronic kidney disease, stage 3a ([place]) - This is fairly stable. - The patient will be placed on hydration with IV normal saline.   DVT prophylaxis: IV heparin Advanced Care Planning:  Code Status: full  code.  Family Communication:  The plan of care was discussed in details with the patient (and  family). I answered all questions. The patient agreed to proceed with the above mentioned plan. Further management will depend upon hospital course. Disposition Plan: Back to previous home environment Consults called: Vascular surgery All the records are reviewed and case discussed with ED provider.  Status is: Inpatient    At the time of the admission, it appears that the appropriate admission status for this patient is inpatient.  This is judged to be reasonable and necessary in order to provide the required intensity of service to ensure the patient's safety given the presenting symptoms, physical exam findings and initial radiographic and laboratory data in the context of comorbid conditions.  The patient requires inpatient status due to high intensity of service, high risk of further deterioration and high frequency of surveillance required.  I certify that at the time of admission, it is my clinical judgment that the patient will require inpatient hospital care extending more than 2 midnights.                            Dispo: The patient is from: Home              Anticipated d/c is to: Home              Patient currently is not medically stable to d/c.              Difficult to place patient: No  Christel Mormon M.D on 01/29/2022 at 9:24 PM  [facility]   From 7 PM-7 AM, contact night-coverage [email or web address]  CC: Primary care physician; [name], MD

## 2022-01-30 ENCOUNTER — Inpatient Hospital Stay (HOSPITAL_COMMUNITY): Payer: Medicare HMO

## 2022-01-30 ENCOUNTER — Telehealth (HOSPITAL_COMMUNITY): Payer: Self-pay | Admitting: Pharmacy Technician

## 2022-01-30 ENCOUNTER — Other Ambulatory Visit (HOSPITAL_COMMUNITY): Payer: Self-pay

## 2022-01-30 ENCOUNTER — Encounter (HOSPITAL_COMMUNITY)
Admission: EM | Disposition: A | Discharge status: Home / Self Care | Payer: Self-pay | Source: Home / Self Care | Attending: Internal Medicine

## 2022-01-30 ENCOUNTER — Encounter (HOSPITAL_COMMUNITY): Payer: Self-pay | Admitting: Family Medicine

## 2022-01-30 ENCOUNTER — Other Ambulatory Visit (HOSPITAL_COMMUNITY): Payer: Medicare HMO

## 2022-01-30 ENCOUNTER — Other Ambulatory Visit: Payer: Self-pay

## 2022-01-30 DIAGNOSIS — I82421 Acute embolism and thrombosis of right iliac vein: Secondary | ICD-10-CM | POA: Diagnosis not present

## 2022-01-30 DIAGNOSIS — R0602 Shortness of breath: Secondary | ICD-10-CM

## 2022-01-30 DIAGNOSIS — I82491 Acute embolism and thrombosis of other specified deep vein of right lower extremity: Secondary | ICD-10-CM

## 2022-01-30 DIAGNOSIS — I2699 Other pulmonary embolism without acute cor pulmonale: Secondary | ICD-10-CM

## 2022-01-30 HISTORY — PX: PERIPHERAL VASCULAR BALLOON ANGIOPLASTY: CATH118281

## 2022-01-30 HISTORY — PX: PERIPHERAL VASCULAR THROMBECTOMY: CATH118306

## 2022-01-30 HISTORY — PX: INTRAVASCULAR ULTRASOUND/IVUS: CATH118244

## 2022-01-30 LAB — CBC
HCT: 38.7 % — ABNORMAL LOW (ref 39.0–52.0)
Hemoglobin: 13.3 g/dL (ref 13.0–17.0)
MCH: 30.6 pg (ref 26.0–34.0)
MCHC: 34.4 g/dL (ref 30.0–36.0)
MCV: 89.2 fL (ref 80.0–100.0)
Platelets: 128 10*3/uL — ABNORMAL LOW (ref 150–400)
RBC: 4.34 MIL/uL (ref 4.22–5.81)
RDW: 13 % (ref 11.5–15.5)
WBC: 7.2 10*3/uL (ref 4.0–10.5)
nRBC: 0 % (ref 0.0–0.2)

## 2022-01-30 LAB — ECHOCARDIOGRAM COMPLETE
AR max vel: 2.4 cm2
AV Peak grad: 6.5 mmHg
Ao pk vel: 1.28 m/s
Area-P 1/2: 4.6 cm2
Height: 67.5 in
S' Lateral: 3.3 cm
Weight: 3280 oz

## 2022-01-30 LAB — BASIC METABOLIC PANEL
Anion gap: 9 (ref 5–15)
BUN: 37 mg/dL — ABNORMAL HIGH (ref 8–23)
CO2: 21 mmol/L — ABNORMAL LOW (ref 22–32)
Calcium: 9 mg/dL (ref 8.9–10.3)
Chloride: 111 mmol/L (ref 98–111)
Creatinine, Ser: 1.53 mg/dL — ABNORMAL HIGH (ref 0.61–1.24)
GFR, Estimated: 49 mL/min — ABNORMAL LOW (ref >60)
Glucose, Bld: 70 mg/dL (ref 70–99)
Potassium: 3.9 mmol/L (ref 3.5–5.1)
Sodium: 141 mmol/L (ref 135–145)

## 2022-01-30 LAB — GLUCOSE, CAPILLARY
Glucose-Capillary: 77 mg/dL (ref 70–99)
Glucose-Capillary: 148 mg/dL — ABNORMAL HIGH (ref 70–99)
Glucose-Capillary: 191 mg/dL — ABNORMAL HIGH (ref 70–99)
Glucose-Capillary: 207 mg/dL — ABNORMAL HIGH (ref 70–99)

## 2022-01-30 LAB — HEPARIN LEVEL (UNFRACTIONATED)
Heparin Unfractionated: 0.8 IU/mL — ABNORMAL HIGH (ref 0.30–0.70)
Heparin Unfractionated: 1.06 IU/mL — ABNORMAL HIGH (ref 0.30–0.70)

## 2022-01-30 LAB — POCT ACTIVATED CLOTTING TIME: Activated Clotting Time: 221 seconds

## 2022-01-30 SURGERY — PERIPHERAL VASCULAR THROMBECTOMY
Anesthesia: LOCAL | Laterality: Right

## 2022-01-30 MED ORDER — HEPARIN SODIUM (PORCINE) 1000 UNIT/ML IJ SOLN
INTRAMUSCULAR | Status: DC | PRN
  Administered 2022-01-30: 2000 [IU] via INTRAVENOUS
  Administered 2022-01-30: 4000 [IU] via INTRAVENOUS

## 2022-01-30 MED ORDER — HEPARIN (PORCINE) IN NACL 1000-0.9 UT/500ML-% IV SOLN
INTRAVENOUS | Status: AC
  Filled 2022-01-30: qty 500

## 2022-01-30 MED ORDER — MIDAZOLAM HCL 2 MG/2ML IJ SOLN
INTRAMUSCULAR | Status: AC
  Filled 2022-01-30: qty 2

## 2022-01-30 MED ORDER — LIDOCAINE HCL (PF) 1 % IJ SOLN
INTRAMUSCULAR | Status: DC | PRN
  Administered 2022-01-30: 10 mL

## 2022-01-30 MED ORDER — IODIXANOL 320 MG/ML IV SOLN
INTRAVENOUS | Status: DC | PRN
  Administered 2022-01-30: 60 mL

## 2022-01-30 MED ORDER — HEPARIN SODIUM (PORCINE) 1000 UNIT/ML IJ SOLN
INTRAMUSCULAR | Status: AC
  Filled 2022-01-30: qty 10

## 2022-01-30 MED ORDER — MIDAZOLAM HCL 2 MG/2ML IJ SOLN
INTRAMUSCULAR | Status: DC | PRN
  Administered 2022-01-30: 1 mg via INTRAVENOUS

## 2022-01-30 MED ORDER — MORPHINE SULFATE (PF) 2 MG/ML IV SOLN: 2.0000 mg | INTRAVENOUS | Status: DC | PRN

## 2022-01-30 MED ORDER — HEPARIN (PORCINE) IN NACL 1000-0.9 UT/500ML-% IV SOLN
INTRAVENOUS | Status: DC | PRN
  Administered 2022-01-30 (×2): 500 mL

## 2022-01-30 MED ORDER — KETOROLAC TROMETHAMINE 15 MG/ML IJ SOLN
15.0000 mg | Freq: Four times a day (QID) | INTRAMUSCULAR | Status: DC | PRN
  Administered 2022-01-30: 15 mg via INTRAVENOUS
  Filled 2022-01-30: qty 1

## 2022-01-30 MED ORDER — LIDOCAINE HCL (PF) 1 % IJ SOLN
INTRAMUSCULAR | Status: AC
  Filled 2022-01-30: qty 30

## 2022-01-30 MED ORDER — FENTANYL CITRATE (PF) 100 MCG/2ML IJ SOLN
INTRAMUSCULAR | Status: DC | PRN
  Administered 2022-01-30: 25 ug via INTRAVENOUS

## 2022-01-30 MED ORDER — ORAL CARE MOUTH RINSE: 15.0000 mL | OROMUCOSAL | Status: DC | PRN

## 2022-01-30 MED ORDER — INSULIN ASPART 100 UNIT/ML IJ SOLN
0.0000 [IU] | Freq: Three times a day (TID) | INTRAMUSCULAR | Status: DC
  Administered 2022-01-30: 2 [IU] via SUBCUTANEOUS
  Administered 2022-01-31: 3 [IU] via SUBCUTANEOUS

## 2022-01-30 MED ORDER — FENTANYL CITRATE (PF) 100 MCG/2ML IJ SOLN
INTRAMUSCULAR | Status: AC
  Filled 2022-01-30: qty 2

## 2022-01-30 MED ORDER — CALCIUM CARBONATE ANTACID 500 MG PO CHEW: 1.0000 | CHEWABLE_TABLET | Freq: Two times a day (BID) | ORAL | Status: DC | PRN

## 2022-01-30 MED ORDER — OXYCODONE-ACETAMINOPHEN 5-325 MG PO TABS
1.0000 | ORAL_TABLET | ORAL | Status: DC | PRN
  Administered 2022-01-30: 2 via ORAL
  Filled 2022-01-30: qty 2

## 2022-01-30 SURGICAL SUPPLY — 17 items
BALLN MUSTANG 10X60X135 (BALLOONS) ×3
BALLOON MUSTANG 10X60X135 (BALLOONS) IMPLANT
CATH ANGIO 5F BER2 100CM (CATHETERS) IMPLANT
CATH CLOT TRIEVER BOLD (CATHETERS) IMPLANT
CATH INDIGO 8XTORQ 115 KIT (CATHETERS) IMPLANT
CATH RETRIEVER CLOT 16MMX105CM (CATHETERS) IMPLANT
CATH VISIONS PV .035 IVUS (CATHETERS) IMPLANT
GLIDEWIRE ADV .035X260CM (WIRE) IMPLANT
KIT ENCORE 26 ADVANTAGE (KITS) IMPLANT
KIT MICROPUNCTURE NIT STIFF (SHEATH) IMPLANT
PROTECTION STATION PRESSURIZED (MISCELLANEOUS) ×3
SHEATH CLOT RETRIEVER (SHEATH) IMPLANT
SHEATH PINNACLE 8F 10CM (SHEATH) IMPLANT
SHEATH PROBE COVER 6X72 (BAG) IMPLANT
STATION PROTECTION PRESSURIZED (MISCELLANEOUS) IMPLANT
TRAY PV CATH (CUSTOM PROCEDURE TRAY) IMPLANT
WIRE TORQFLEX AUST .018X40CM (WIRE) IMPLANT

## 2022-01-30 NOTE — Op Note (Signed)
Patient name: Zyire Eidson MRN: [id number] DOB: January 18, 1951 Sex: male  01/30/2022 Pre-operative Diagnosis: Extensive right leg DVT with iliofemoral and IVC involvement Post-operative diagnosis:  Same Surgeon:  Marty Heck, MD Procedure Performed: 1.  Ultrasound-guided access right popliteal vein 2.  Intravascular ultrasound (IVUS) of right popliteal, superficial femoral, common femoral, iliac vein and IVC 3.  Percutaneous mechanical venous thrombectomy of the right popliteal, superficial femoral, common femoral, iliac vein and distal IVC (Inari Clottriever) 4.  Percutaneous mechanical venous thrombectomy of the right popliteal, superficial femoral, common femoral, iliac vein and distal IVC (CAT 8 penumbra) 5.  Angioplasty of the right iliac vein and distal IVC (10 mm x 60 mm Mustang) 6.  98 minutes of monitored moderate conscious sedation time  Indications: Patient is a 71 year old male seen last night in ED by Dr. Virl Cagey with extensive right leg DVT with thrombus into the vena cava.  He presents today for percutaneous thrombectomy after risk benefits discussed.  Findings:   Right popliteal vein was accessed with the patient in the prone position.  IVUS initially showed thrombus in the right popliteal, superficial femoral, common femoral, iliac vein into the distal IVC with some hyperechoic areas suggesting more chronicity.  Initially used the [employer] in the vena cava above the thrombus and the device would not come down and appeared to be caught.  Ultimately we were able to retrieve what appeared to be very hard substance consistent with calcium in the distal IVC.  I did not think putting this device back in was a good idea given the difficulty we had with the initial attempt.  I then used the penumbra CAT 8 and got a lot of acute as well as some chronic appearing thrombus from the right leg into the IVC.  The right iliac as well as distal IVC was angioplastied with a 10 mm  x 60 mm Mustang and patient now has a patent flow channel at completion.   Procedure:  The patient was identified in the holding area and taken to room 8.  The patient was placed prone on the table.  Timeout was performed.  Initially evaluated the right popliteal vein with ultrasound, it was patent and image was saved.  This was accessed with micro access needle and placed a microwire and then a microsheath.  I then advanced a Glidewire advantage into the inferior vena cava and then exchanged for a 8 French sheath in the right popliteal vein.  Patient was given another 4000 units of IV heparin.  We checked an ACT to maintain greater than 250.  I then used IVUS and performed IVUS of the right popliteal, superficial femoral, common femoral, iliac vein, and IVC with findings as noted above.  I then elected to attempt Inari clottreiver and placed the 13 [place] Inari sheath in the right popliteal vein and then placed the device into the inferior vena cava once we had a wire into the left subclavian vein.  I then opened the device in the health patent vena cava and attempted to pull it through the distal IVC and ultimately the device caught and would not come down as expected.  Using a number of maneuvers we were finally able to get the device down through the IVC but then I could not get it totally restrained to come out of the sheath and I removed the entire device and sheath over the wire leaving the wire in place and the device was removed in its entirety.  There  was a large piece of hard substance like calcium that was retrieved from the vena cava that will be sent to the pathologist.  I then placed a new 13 [place] Inari sheath over a wire given we still had wire access.  I elected to not use innari clottriever anymore given the difficulty we had and then decided to use a penumbra CAT 8 and we performed multiple passes with percutaneous thrombectomy.  EBL was about 400 mL.  We did establish a flow channel and I  ultimately ended up performing balloon angioplasty of the right iliac vein and distal IVC with a 10 mm x 60 mm Mustang.  Wires and catheters were removed.  We pulled the sheath and tied down the pursestring with 4-0 monocryl with good hemostasis.  Plan: Do not stop heparin and this was continued through the procedure and patient can be transitioned to a DOAC.   Marty Heck, MD [facility] Office: [phone]

## 2022-01-30 NOTE — Telephone Encounter (Signed)
Pharmacy Patient Advocate Encounter  Insurance verification completed.    The patient is insured through [employer] Part D   The patient is currently admitted and ran test claims for the following: Eliquis, Xarelto.  Copays and coinsurance results were relayed to Inpatient clinical team.

## 2022-01-30 NOTE — Progress Notes (Signed)
Patient brought to 4E from cath lab. VSS. Telemetry box applied, CCMD notified. Patient oriented to room and staff. Call bell in reach.  Daymon Larsen, RN

## 2022-01-30 NOTE — Telephone Encounter (Signed)
Will defer to PCP after patient has ER eval.

## 2022-01-30 NOTE — ED Notes (Signed)
Patient awake and alert this morning, no s/s of distress, family present at bedside, they are aware that he will be having procedures today, patient reported new blisters on his right leg, advises I would notify his provider. Family present at bedside, will continue to monitor.

## 2022-01-30 NOTE — Progress Notes (Signed)
[facility] for heparin  Indication: DVT  No Known Allergies  Patient Measurements: Height: 5' 7.5" (171.5 cm) Weight: 93 kg (205 lb) IBW/kg (Calculated) : 67.25 Heparin Dosing Weight: 86.7kg  Vital Signs: Temp: 96.3 F (35.7 C) (10/13 1410) Temp Source: Axillary (10/13 1410) BP: 113/65 (10/13 1410) Pulse Rate: 76 (10/13 1410)  Labs: Recent Labs    01/29/22 1057 01/30/22 0350  HGB 15.1 13.3  HCT 43.4 38.7*  PLT 151 128*  HEPARINUNFRC  --  0.80*  CREATININE 1.47* 1.53*     Estimated Creatinine Clearance: 49.3 mL/min (A) (by C-G formula based on SCr of 1.53 mg/dL (H)).   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   by [place]   CAD (coronary artery disease) 2003   cath - Min Dz, EF 65%, Adm.R/O'd   Cervical spondylosis without myelopathy    Chest pain, atypical    Choroidal nevus 01/22/2011   left, yearly eye exam, no diabetic retinopathy   Diabetes mellitus type II    DVT of deep femoral vein, left ([place]) 04/29/2018   LLE DVT from proximal femoral vein to popliteal vein into proximal calf (04/29/2018) started on xarelto Heme (Magrinat) recommended continuing lifelong lower dose anticoagulant indefinitely (11/2018)   Dyspepsia    Ex-smoker    Fatty liver 02/2016   by [place]   GERD (gastroesophageal reflux disease)    Headache(784.0)    Heart murmur    Hx of   History of MRI of brain and brain stem 09/06   History of MRSA infection 12/2013   R knee boil   HLD (hyperlipidemia)    Hyperuricemia    Obesity    Sleep apnea    Stress-induced cardiomyopathy 09/27/98   WNL, EF 64%   Urosepsis 01/01-01/05/10   Hospitalization    Assessment: 36 YOM presenting with swelling in leg with pain, DVT on doppler,  he is not on anticoagulation PTA  Heparin adjusted overnight. Noted vascular procedure details. Heparin continued, will retime level for this evening given multiple bolus of heparin received during thrombectomy.   Goal of Therapy:  Heparin level 0.3-0.7 units/ml Monitor platelets by anticoagulation protocol: Yes   Plan:  Continue heparin gtt at 1400 units/hr Check heparin level tonight  Erin Hearing PharmD., BCPS Clinical Pharmacist 01/30/2022 2:54 PM

## 2022-01-30 NOTE — Progress Notes (Signed)
  Daily Progress Note  Right iliofemoral DVT  Subjective: Continues to have significant pain in the right lower extremity  Objective: Vitals:   01/30/22 0400 01/30/22 0600  BP: 102/83 91/60  Pulse: 76 79  Resp: 16 18  Temp:  98.9 F (37.2 C)  SpO2: 94% 93%    Physical Examination General:  WDWN in NAD Gait: Not observed HENT: WNL, normocephalic Pulmonary: normal non-labored breathing, without Rales, rhonchi,  wheezing Cardiac: regula Abdomen: soft, NT/ND, no masses Skin: without rashes Vascular Exam/Pulses: 2+ Dps bilaterally Extremities: without ischemic changes, without Gangrene , without cellulitis; without open wounds; significant right-sided edema Musculoskeletal: no muscle wasting or atrophy       Neurologic: A&O X 3;  No focal weakness or paresthesias are detected; speech is fluent/normal Psychiatric:  The pt has Normal affect. Lymph:  Unremarkable  ASSESSMENT/PLAN:  Plan for right sided iliofemoral venous mechanical thrombectomy After discussing the risk and benefits of surgery, [place] elected to proceed. Some nonocclusive thrombus in the left common iliac vein. Will discuss with Dr. Carlis Abbott - will likely leave as it is minimal PE seen. Will need Echo s/p thrombectomy to eval right heart strain. Asymptomatic from a cardiorespiratory standpoint on exam this morning.    Cassandria Santee MD MS [facility] [phone] 01/30/2022  7:57 AM

## 2022-01-30 NOTE — TOC Benefit Eligibility Note (Signed)
Patient Teacher, English as a foreign language completed.    The patient is currently admitted and upon discharge could be taking Eliquis 5 mg.  The current 30 day co-pay is $30.29.   The patient is currently admitted and upon discharge could be taking Xarelto 20 mg.  The current 30 day co-pay is $29.30.   The patient is insured through [place], [place] Patient Advocate Specialist [place] Patient Advocate Team Direct Number: [phone]  Fax: [phone]

## 2022-01-30 NOTE — Progress Notes (Signed)
ANTICOAGULATION CONSULT NOTE   Pharmacy Consult for heparin  Indication: DVT  No Known Allergies  Patient Measurements: Height: 5' 7.5" (171.5 cm) Weight: 93 kg (205 lb) IBW/kg (Calculated) : 67.25 Heparin Dosing Weight: 86.7kg  Vital Signs: Temp: 98 F (36.7 C) (10/12 1746) BP: 102/83 (10/13 0400) Pulse Rate: 76 (10/13 0400)  Labs: Recent Labs    01/29/22 1057 01/30/22 0350  HGB 15.1 13.3  HCT 43.4 38.7*  PLT 151 128*  HEPARINUNFRC  --  0.80*  CREATININE 1.47*  --      Estimated Creatinine Clearance: 51.3 mL/min (A) (by C-G formula based on SCr of 1.47 mg/dL (H)).   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   by [place]   CAD (coronary artery disease) 2003   cath - Min Dz, EF 65%, Adm.R/O'd   Cervical spondylosis without myelopathy    Chest pain, atypical    Choroidal nevus 01/22/2011   left, yearly eye exam, no diabetic retinopathy   Diabetes mellitus type II    DVT of deep femoral vein, left ([place]) 04/29/2018   LLE DVT from proximal femoral vein to popliteal vein into proximal calf (04/29/2018) started on xarelto Heme (Magrinat) recommended continuing lifelong lower dose anticoagulant indefinitely (11/2018)   Dyspepsia    Ex-smoker    Fatty liver 02/2016   by [place]   GERD (gastroesophageal reflux disease)    Headache(784.0)    Heart murmur    Hx of   History of MRI of brain and brain stem 09/06   History of MRSA infection 12/2013   R knee boil   HLD (hyperlipidemia)    Hyperuricemia    Obesity    Sleep apnea    Stress-induced cardiomyopathy 09/27/98   WNL, EF 64%   Urosepsis 01/01-01/05/10   Hospitalization    Assessment: 9 YOM presenting with swelling in leg with pain, DVT on doppler, he is not on anticoagulation  PTA  Heparin level 0.80 on 1500 units/hr; PLT down slightly to 128, no overt s/sx of bleeding per RN. Given new DVT will decrease conservatively as the bolus from earlier is likely still impacting the heparin level.   Goal of Therapy:  Heparin level 0.3-0.7 units/ml Monitor platelets by anticoagulation protocol: Yes   Plan:  Decrease heparin gtt to 1400 units/hr F/u 6 hour heparin level F/u long term AC plan  Georga Bora, PharmD Clinical Pharmacist 01/30/2022 4:43 AM Please check AMION for all [place] numbers

## 2022-01-30 NOTE — Progress Notes (Signed)
PROGRESS NOTE  Alan Morrison DJM:[id number] DOB: 18-Jul-1950 DOA: 01/29/2022 PCP: [name], MD   LOS: 1 day   Brief Narrative / Interim history: 71 year old male with CAD status post stenting in the past, DM 2, left lower extremity DVT for which she was on Xarelto but now no longer anticoagulation, hyperlipidemia, OSA comes into the hospital with right lower extremity swelling over the last few days.  Imaging in the ED showed acute thrombus of the right, iliac vein, right external iliac vein and suspected extension of the common femoral veins.  CTA of the chest showed bilateral PEs with moderate thrombus burden as well as RV heart strain.  Vascular surgery consulted, he was also placed on heparin  Subjective / 24h Interval events: Complains of right lower extremity swelling.  Denies any chest pain, denies any palpitations.  Denies any long travel, driving, long-haul flights.  But he does report sedentary lifestyle  Assesement and Plan: Principal Problem:   Acute deep vein thrombosis (DVT) of iliac vein of right lower extremity (HCC) Active Problems:   Type 2 diabetes mellitus with chronic kidney disease, with long-term current use of insulin (HCC)   Coronary artery disease   Dyslipidemia   Essential hypertension   BPH (benign prostatic hyperplasia)   Chronic kidney disease, stage 3a (HCC)  Principal problem Acute DVT, occlusive thrombus in the external iliac, common femoral, superficial femoral, and deep femoral veins on the right -vascular surgery consulted, patient will be taken to the OR today for thrombectomy.  Continue heparin infusion  Active problems Acute PE-with moderate burden, clinically without chest pain, tachycardia, significant hypotension.  Obtain a 2D echo given concern for heart strain.  On heparin  CAD-with prior stenting, currently on aspirin, statin, ARB, beta-blocker  Essential hypertension-continue home medications as below  Hyperlipidemia-continue  statin  BPH-continue Flomax  CKD 3A-creatinine at baseline  1.4 cm indeterminate left adrenal nodule-outpatient follow-up, repeat CT scan in 1 year.  5.3 mm right lower lobe pulmonary nodule-outpatient follow-up  DM2-continue Lantus, placed on sliding scale  Lab Results  Component Value Date   HGBA1C 7.1 (A) 10/14/2021   CBG (last 3)  Recent Labs    01/29/22 2222  GLUCAP 190*       Scheduled Meds:  [MAR Hold] aspirin EC  81 mg Oral Q supper   [MAR Hold] calcium carbonate  1 tablet Oral BID WC   [MAR Hold] cholecalciferol  2,000 Units Oral Q breakfast   [MAR Hold] ferrous sulfate  325 mg Oral QODAY   [MAR Hold] finasteride  5 mg Oral Daily   insulin aspart  0-9 Units Subcutaneous TID WC   [MAR Hold] insulin glargine-yfgn  100 Units Subcutaneous QHS   [MAR Hold] losartan  25 mg Oral Daily   [MAR Hold] metoprolol tartrate  12.5 mg Oral BID   [MAR Hold] multivitamin with minerals  1 tablet Oral Q breakfast   [MAR Hold] probenecid  250 mg Oral QODAY   [MAR Hold] rosuvastatin  40 mg Oral Q supper   [MAR Hold] tamsulosin  0.4 mg Oral Daily   Continuous Infusions:  sodium chloride 100 mL/hr at 01/29/22 1939   heparin 1,500 Units/hr (01/29/22 1936)   PRN Meds:.acetaminophen **OR** acetaminophen, ketorolac, magnesium hydroxide, morphine injection, nitroGLYCERIN, ondansetron **OR** ondansetron (ZOFRAN) IV, oxyCODONE-acetaminophen, traZODone  Current Outpatient Medications  Medication Instructions   acetaminophen (TYLENOL) 1,000 mg, Oral, 2 times daily PRN   aspirin EC 81 mg, Oral, Daily with supper, Swallow whole.    Basaglar KwikPen 100  Units, Subcutaneous, Daily at bedtime   Calcium Carbonate Antacid (TUMS PO) 1 tablet, Oral, Daily PRN   finasteride (PROSCAR) 5 MG tablet TAKE 1 TABLET BY MOUTH EVERY DAY   Insulin Pen Needle (B-D ULTRAFINE III SHORT PEN) 31G X 8 MM MISC 1 Device, Other, 4 times daily, USE AS DIRECTED DAILY   Iron (Ferrous Sulfate) 325 mg, Oral, Every other  day   losartan (COZAAR) 25 mg, Oral, Daily   metFORMIN (GLUCOPHAGE) 1,000 mg, Oral, Daily with breakfast, TAKE 1 TABLET BY MOUTH EVERY DAY AT BREAKFAST<BR>Strength: 1,000 mg   metoprolol tartrate (LOPRESSOR) 25 MG tablet TAKE 0.5 TABLETS BY MOUTH 2 TIMES DAILY.   Multiple Vitamin (MULTIVITAMIN WITH MINERALS) TABS tablet 1 tablet, Oral, Daily with breakfast   nitroGLYCERIN (NITROSTAT) 0.4 MG SL tablet PLACE 1 TABLET UNDER THE TONGUE EVERY 5 MINUTES AS NEEDED FOR CHEST PAIN   ONETOUCH ULTRA test strip USE TO CHECK SUGARS DAILY  AS DIRECTED   probenecid (BENEMID) 250 mg, Oral, Every other day   rosuvastatin (CRESTOR) 40 mg, Oral, Daily with supper   tamsulosin (FLOMAX) 0.4 mg, Oral, Daily   Trulicity 0.25 mg, Subcutaneous, Weekly   Vitamin D 2,000 Units, Oral, Daily with breakfast    Diet Orders (From admission, onward)     Start     Ordered   01/30/22 0001  Diet NPO time specified  Diet effective midnight        01/29/22 1929            DVT prophylaxis:    Lab Results  Component Value Date   PLT 128 (L) 01/30/2022      Code Status: Full Code  Family Communication: no family at bedside   Status is: Inpatient  Remains inpatient appropriate because: severity of illness  Level of care: Telemetry Medical  Consultants:  Vascular surgery   Objective: Vitals:   01/29/22 2315 01/29/22 2330 01/30/22 0400 01/30/22 0600  BP:   102/83 91/60  Pulse: 71 72 76 79  Resp: '12 17 16 18  '$ Temp:    98.9 F (37.2 C)  TempSrc:      SpO2: 93% 93% 94% 93%  Weight:      Height:       No intake or output data in the 24 hours ending 01/30/22 0954 Wt Readings from Last 3 Encounters:  01/29/22 93 kg  01/14/22 93.9 kg  01/04/22 93.8 kg    Examination:  Constitutional: NAD Eyes: no scleral icterus ENMT: Mucous membranes are moist.  Neck: normal, supple Respiratory: clear to auscultation bilaterally, no wheezing, no crackles. Normal respiratory effort. No accessory muscle use.   Cardiovascular: Regular rate and rhythm, no murmurs / rubs / gallops.  Right lower extremity edema present Abdomen: non distended, no tenderness. Bowel sounds positive.  Musculoskeletal: no clubbing / cyanosis.    Data Reviewed: I have independently reviewed following labs and imaging studies   CBC Recent Labs  Lab 01/29/22 1057 01/30/22 0350  WBC 8.7 7.2  HGB 15.1 13.3  HCT 43.4 38.7*  PLT 151 128*  MCV 89.5 89.2  MCH 31.1 30.6  MCHC 34.8 34.4  RDW 13.2 13.0  LYMPHSABS 1.4  --   MONOABS 0.6  --   EOSABS 0.2  --   BASOSABS 0.0  --     Recent Labs  Lab 01/29/22 1057 01/30/22 0350  NA 137 141  K 4.4 3.9  CL 105 111  CO2 19* 21*  GLUCOSE 190* 70  BUN 33* 37*  CREATININE 1.47* 1.53*  CALCIUM 9.9 9.0    ------------------------------------------------------------------------------------------------------------------ No results for input(s): "CHOL", "HDL", "LDLCALC", "TRIG", "CHOLHDL", "LDLDIRECT" in the last 72 hours.  Lab Results  Component Value Date   HGBA1C 7.1 (A) 10/14/2021   ------------------------------------------------------------------------------------------------------------------ No results for input(s): "TSH", "T4TOTAL", "T3FREE", "THYROIDAB" in the last 72 hours.  Invalid input(s): "FREET3"  Cardiac Enzymes No results for input(s): "CKMB", "TROPONINI", "MYOGLOBIN" in the last 168 hours.  Invalid input(s): "CK" ------------------------------------------------------------------------------------------------------------------    Component Value Date/Time   BNP 16.8 01/02/2022 2019    CBG: Recent Labs  Lab 01/29/22 2222  GLUCAP 190*    No results found for this or any previous visit (from the past 240 hour(s)).   Radiology Studies: CT VENOGRAM ABD/PELVIS/LOWER EXT BILAT  Result Date: 01/29/2022 CLINICAL DATA:  Deep venous thrombosis, right lower extremity swelling with extensive known DVT and pulmonary emboli. EXAM: CT VENOGRAM  ABDOMEN AND PELVIS AND LOWER EXTREMITY BILATERAL TECHNIQUE: Multidetector CT imaging of the abdomen and pelvis and upper legs bilaterally was performed using the standard protocol during bolus administration of intravenous contrast. Multiplanar reconstructed images and MIPS were obtained and reviewed to evaluate the vascular anatomy. RADIATION DOSE REDUCTION: This exam was performed according to the departmental dose-optimization program which includes automated exposure control, adjustment of the mA and/or kV according to patient size and/or use of iterative reconstruction technique. CONTRAST:  118m OMNIPAQUE IOHEXOL 350 MG/ML SOLN COMPARISON:  None Available. FINDINGS: Lower chest: The heart is enlarged and multi-vessel coronary artery calcifications are noted. Mild atelectasis and scarring present at the lung bases. A 3 mm nodule is present in the right lower lobe, axial image 10, not seen on recent CTA chest. Hepatobiliary: No focal liver abnormality is seen. Mild hepatic steatosis. No gallstones, gallbladder wall thickening, or biliary dilatation. Pancreas: Mild fatty atrophy. No pancreatic ductal dilatation or surrounding inflammatory changes. Spleen: Normal in size without focal abnormality. Adrenals/Urinary Tract: There is a nodule in the left adrenal gland measuring 1.4 cm with attenuation of 31 Hounsfield units. The right adrenal gland is within normal limits. The kidneys enhance symmetrically. No renal calculus or hydronephrosis. The bladder is unremarkable. Stomach/Bowel: Stomach is within normal limits. Appendix is not seen. No evidence of bowel wall thickening, distention, or inflammatory changes. No free air or pneumatosis. Vascular/Lymphatic: Aortic atherosclerosis without evidence of aneurysm. No abdominal or pelvic lymphadenopathy. Reproductive: Prostate gland is enlarged. Bilateral hydroceles are noted. Other: No abdominopelvic ascites. Small fat containing umbilical hernia. Musculoskeletal:  There is diffuse subcutaneous edema and fat stranding in the right lower extremity. A small joint effusion is noted at the right knee. Degenerative changes are present in the thoracolumbar spine. No acute osseous abnormality. IVC: Calcification is noted in the distal IVC and proximal common iliac arteries bilaterally. Nonocclusive thrombus is present in the distal IVC. Portal and mesenteric veins: No evidence for thrombus or stenosis. Bilateral iliac veins: Nonocclusive thrombus is present in the common iliac vein on the right. There is minimal nonocclusive thrombus in the common iliac vein on the left. There is occlusive thrombus in the external iliac vein on the right. The external iliac vein on the left is patent. The internal iliac veins are not well visualized on exam. Right lower extremity: There is a occlusive thrombus involving the common femoral, femoral, and superficial femoral veins on the right. Suboptimal opacification of the popliteal vein. Left lower extremity: The common femoral, femoral, and superficial femoral veins are patent. There is suboptimal opacification of the popliteal vein. IMPRESSION: 1.  Nonocclusive thrombus and calcification in the distal inferior vena cava and common iliac arteries bilaterally. 2. Occlusive thrombus in the external iliac, common femoral, superficial femoral, and deep femoral veins on the right. 3. Asymmetric enlargement of the right lower extremity with edema. 4. Indeterminate left adrenal nodule measuring 1.4 cm. Recommend follow-up adrenal washout CT in 1 year. 5. 3 mm right lower lobe pulmonary nodule, not seen on previous CTA chest. No follow-up needed if patient is low-risk.This recommendation follows the consensus statement: Guidelines for Management of Incidental Pulmonary Nodules Detected on CT Images: From the Fleischner Society 2017; Radiology 2017; 284:228-243. 6. Aortic atherosclerosis and coronary artery calcifications. 7. Remaining incidental findings as  described above. Electronically Signed   By: Brett Fairy M.D.   On: 01/29/2022 23:40   CT Angio Chest Pulmonary Embolism (PE) W or WO Contrast  Addendum Date: 01/29/2022   ADDENDUM REPORT: 01/29/2022 22:37 ADDENDUM: Critical findings were reported to Dr. Sidney Ace at 10:37 p.m. Electronically Signed   By: Brett Fairy M.D.   On: 01/29/2022 22:37   Result Date: 01/29/2022 CLINICAL DATA:  Pulmonary embolism suspected, high probability. History of prior DVT. EXAM: CT ANGIOGRAPHY CHEST WITH CONTRAST TECHNIQUE: Multidetector CT imaging of the chest was performed using the standard protocol during bolus administration of intravenous contrast. Multiplanar CT image reconstructions and MIPs were obtained to evaluate the vascular anatomy. RADIATION DOSE REDUCTION: This exam was performed according to the departmental dose-optimization program which includes automated exposure control, adjustment of the mA and/or kV according to patient size and/or use of iterative reconstruction technique. CONTRAST:  175m OMNIPAQUE IOHEXOL 350 MG/ML SOLN COMPARISON:  03/17/2017. FINDINGS: Cardiovascular: Heart is enlarged and there is no pericardial effusion. Multi-vessel coronary artery calcifications are noted. There is atherosclerotic calcification of the aorta without evidence of aneurysm. The pulmonary trunk is normal in caliber. Pulmonary emboli are identified in the lobar, segmental, and subsegmental pulmonary emboli in the right upper and lower lobes. A pulmonary embolus is noted in the left lower lobe barb pulmonary artery. The right ventricle is distended suggesting underlying right heart strain. Mediastinum/Nodes: No mediastinal or axillary lymphadenopathy. Mild lymphadenopathy is noted in the hilar regions bilaterally. The thyroid gland, trachea, and esophagus are within normal limits. Lungs/Pleura: Mild hazy regional ground-glass opacities are present bilaterally. No consolidation, effusion, or pneumothorax. Upper  Abdomen: No acute abnormality. Musculoskeletal: Degenerative changes are present in the thoracic spine. No acute osseous abnormality. Review of the MIP images confirms the above findings. IMPRESSION: 1. Bilateral pulmonary emboli with moderate thrombus burden. The right ventricle is distended suggesting right heart strain. 2. Hazy ground-glass opacities in the lungs, possible edema or pneumonitis. 3. Cardiomegaly with multi-vessel coronary artery calcifications. 4. Aortic atherosclerosis. Electronically Signed: By: LBrett FairyM.D. On: 01/29/2022 22:00   VAS [place]IVC/ILIAC (VENOUS ONLY)  Result Date: 01/29/2022 IVC/ILIAC STUDY Patient Name:  DHawkin Charo Date of Exam:   01/29/2022 Medical Rec #: [id number]     Accession #:    [id number] of Birth: [birth year]     Patient Gender: M Patient Age:   [age]years [facility]      [facility] [place]IVC/ILIAC (VENOUS ONLY) Referring Phys: KYvone NeuLE --------------------------------------------------------------------------------  Indications: Continuous flow obseved throughout the right lower extremity with              associated swelling, pain, and shortness of breath. History of left  lower extremity DVT. Limitations: Body habitus.  Comparison Study: 01-29-2022 Right lower extremity venous study. Performing Technologist: Darlin Coco RDMS, RVT  Examination Guidelines: A complete evaluation includes B-mode imaging, spectral Doppler, color Doppler, and power Doppler as needed of all accessible portions of each vessel. Bilateral testing is considered an integral part of a complete examination. Limited examinations for reoccurring indications may be performed as noted.  IVC/Iliac Findings: +----------+------+--------+--------+    IVC    PatentThrombusComments +----------+------+--------+--------+ IVC Prox  patent                 +----------+------+--------+--------+ IVC Mid   patent                  +----------+------+--------+--------+ IVC Distalpatent                 +----------+------+--------+--------+  +-------------------+---------+-----------+---------+-----------+--------+         CIV        RT-PatentRT-ThrombusLT-PatentLT-ThrombusComments +-------------------+---------+-----------+---------+-----------+--------+ Common Iliac Prox   patent              patent                      +-------------------+---------+-----------+---------+-----------+--------+ Common Iliac Mid               acute    patent                      +-------------------+---------+-----------+---------+-----------+--------+ Common Iliac Distal            acute    patent                      +-------------------+---------+-----------+---------+-----------+--------+  +-------------------------+---------+-----------+---------+-----------+--------+            EIV           RT-PatentRT-ThrombusLT-PatentLT-ThrombusComments +-------------------------+---------+-----------+---------+-----------+--------+ External Iliac Vein Prox             acute    patent                      +-------------------------+---------+-----------+---------+-----------+--------+ External Iliac Vein Mid              acute    patent                      +-------------------------+---------+-----------+---------+-----------+--------+ External Iliac Vein                  acute    patent                      Distal                                                                    +-------------------------+---------+-----------+---------+-----------+--------+  Summary: IVC/Iliac: There is no evidence of thrombus involving the IVC. There is evidence of acute thrombus involving the right common iliac vein. There is evidence of acute thrombus involving the right external iliac vein. There is no evidence of thrombus involving the left common iliac vein. There is no evidence of thrombus involving the left  external iliac vein.  *See table(s) above for measurements and observations.  Electronically signed by Orlie Pollen on 01/29/2022 at 8:07:40 PM.  Final    VAS [place] LOWER EXTREMITY VENOUS (DVT) (7a-7p)  Result Date: 01/29/2022  Lower Venous DVT Study Patient Name:  ZAI CHMIEL  Date of Exam:   01/29/2022 Medical Rec #: [id number]      Accession #:    [id number] Date of Birth: 02-Jan-1951      Patient Gender: M Patient Age:   59 years Exam [facility]  [facility] Procedure:      VAS [place] LOWER EXTREMITY VENOUS (DVT) Referring Phys: KEN LE --------------------------------------------------------------------------------  Indications: RIGHT leg pain and swelling x2 days, history of left leg DVT, patient endorses shortness of breath and hip pain.  Anticoagulation: Not currently on anticoagulation, previously on xarelto. Limitations: Difficulty coapting veins secondary to venous pressure and overlying edema. Comparison Study: 04-29-2018 Prior bilateral lower extremity venous study was                   positive for LEFT lower extremity DVT involving the femoral,                   popliteal, profunda, and posterior tibial veins.                    01-03-2022 Most recent prior left lower extremity venous study                   was negative for DVT. Performing Technologist: Darlin Coco RDMS, RVT  Examination Guidelines: A complete evaluation includes B-mode imaging, spectral Doppler, color Doppler, and power Doppler as needed of all accessible portions of each vessel. Bilateral testing is considered an integral part of a complete examination. Limited examinations for reoccurring indications may be performed as noted. The reflux portion of the exam is performed with the patient in reverse Trendelenburg.  +---------+---------------+---------+-----------+---------------+--------------+ RIGHT    CompressibilityPhasicitySpontaneityProperties     Thrombus Aging  +---------+---------------+---------+-----------+---------------+--------------+ CFV      Partial        No       No         Unable to      Rouleaux                                                   compress due toflow--hyperacu                                             more proximal  te platelet                                                obstruction    aggregation    +---------+---------------+---------+-----------+---------------+--------------+ SFJ      Full                                                             +---------+---------------+---------+-----------+---------------+--------------+ FV Prox  Partial        No  No         Unable to                                                                 compress due to                                                           more proximal                                                             obstruction                   +---------+---------------+---------+-----------+---------------+--------------+ FV Mid   Partial        No       No         Unable to                                                                 compress due to                                                           more proximal                                                             obstruction                   +---------+---------------+---------+-----------+---------------+--------------+ FV DistalPartial        No       No         Unable to                                                                 compress due to  more proximal                                                             obstruction                   +---------+---------------+---------+-----------+---------------+--------------+ PFV      Partial        No       No         Unable to                                                                  compress due to                                                           more proximal                                                             obstruction                   +---------+---------------+---------+-----------+---------------+--------------+ POP      Full           No       No                                       +---------+---------------+---------+-----------+---------------+--------------+ PTV      Full                                                             +---------+---------------+---------+-----------+---------------+--------------+ PERO     Full                                                             +---------+---------------+---------+-----------+---------------+--------------+ Gastroc  Full                                                             +---------+---------------+---------+-----------+---------------+--------------+ Unable to compress common femoral and femoral veins secondary to more proximal obstruction and overlying  edema.  +----+---------------+---------+-----------+----------+--------------+ LEFTCompressibilityPhasicitySpontaneityPropertiesThrombus Aging +----+---------------+---------+-----------+----------+--------------+ CFV Full           Yes      Yes                                 +----+---------------+---------+-----------+----------+--------------+     Summary: RIGHT: - No cystic structure found in the popliteal fossa.  - Continuous flow observed throughout the right lower extremity veins suggestive of more proximal obstruction. IVC/iliac duplex examination performed in setting of right lower extremity findings.  LEFT: - No evidence of common femoral vein obstruction.  *See table(s) above for measurements and observations. Electronically signed by Orlie Pollen on 01/29/2022 at 8:07:32 PM.    Final      Marzetta Board, MD, PhD [facility]  Between 7 am - 7 pm I am available, please contact me via Amion (for emergencies) or Securechat (non urgent messages)  Between 7 pm - 7 am I am not available, please contact night coverage MD/APP via Amion

## 2022-01-30 NOTE — Progress Notes (Signed)
ANTICOAGULATION CONSULT NOTE   Pharmacy Consult for heparin Indication: DVT  No Known Allergies  Patient Measurements: Height: 5' 7.5" (171.5 cm) Weight: 93 kg (205 lb) IBW/kg (Calculated) : 67.25 Heparin Dosing Weight: 86.7kg  Vital Signs: Temp: 98.2 F (36.8 C) (10/13 2036) Temp Source: Oral (10/13 2036) BP: 107/68 (10/13 1500) Pulse Rate: 76 (10/13 2036)  Labs: Recent Labs    01/29/22 1057 01/30/22 0350 01/30/22 2017  HGB 15.1 13.3  --   HCT 43.4 38.7*  --   PLT 151 128*  --   HEPARINUNFRC  --  0.80* 1.06*  CREATININE 1.47* 1.53*  --      Estimated Creatinine Clearance: 49.3 mL/min (A) (by C-G formula based on SCr of 1.53 mg/dL (H)).   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   by [place]   CAD (coronary artery disease) 2003   cath - Min Dz, EF 65%, Adm.R/O'd   Cervical spondylosis without myelopathy    Chest pain, atypical    Choroidal nevus 01/22/2011   left, yearly eye exam, no diabetic retinopathy   Diabetes mellitus type II    DVT of deep femoral vein, left ([place]) 04/29/2018   LLE DVT from proximal femoral vein to popliteal vein into proximal calf (04/29/2018) started on xarelto Heme (Magrinat) recommended continuing lifelong lower dose anticoagulant indefinitely (11/2018)   Dyspepsia    Ex-smoker    Fatty liver 02/2016   by [place]   GERD (gastroesophageal reflux disease)    Headache(784.0)    Heart murmur    Hx of   History of MRI of brain and brain stem 09/06   History of MRSA infection 12/2013   R knee boil   HLD (hyperlipidemia)    Hyperuricemia    Obesity    Sleep apnea    Stress-induced cardiomyopathy 09/27/98   WNL, EF 64%   Urosepsis 01/01-01/05/10   Hospitalization    Assessment: 14 YOM presenting with  swelling in leg with pain, DVT on doppler, he is not on anticoagulation PTA.  S/p thrombectomy 10/13 with 6k heparin given during procedure. Heparin level is elevated to 1.06 on 1400 units/hr (noted was 0.8 on 1500 units/hr pre-procedure). Wonder if the boluses may still be affecting level. No issues with line or bleeding reported per RN. Level was drawn from arm opposite heparin. Dr. Carlis Abbott typically requests we not hold the heparin in the thrombectomy patients.  Goal of Therapy:  Heparin level 0.3-0.7 units/ml Monitor platelets by anticoagulation protocol: Yes   Plan:  Decrease heparin gtt to 1200 units/hr Will f/u 8 hr heparin level  Sherlon Handing, PharmD, BCPS Please see amion for complete clinical pharmacist phone list 01/30/2022 9:28 PM

## 2022-01-31 DIAGNOSIS — I82421 Acute embolism and thrombosis of right iliac vein: Secondary | ICD-10-CM | POA: Diagnosis not present

## 2022-01-31 DIAGNOSIS — I998 Other disorder of circulatory system: Secondary | ICD-10-CM | POA: Diagnosis not present

## 2022-01-31 LAB — CBC
HCT: 32.6 % — ABNORMAL LOW (ref 39.0–52.0)
Hemoglobin: 11.3 g/dL — ABNORMAL LOW (ref 13.0–17.0)
MCH: 30.7 pg (ref 26.0–34.0)
MCHC: 34.7 g/dL (ref 30.0–36.0)
MCV: 88.6 fL (ref 80.0–100.0)
Platelets: 128 10*3/uL — ABNORMAL LOW (ref 150–400)
RBC: 3.68 MIL/uL — ABNORMAL LOW (ref 4.22–5.81)
RDW: 13.1 % (ref 11.5–15.5)
WBC: 8.3 10*3/uL (ref 4.0–10.5)
nRBC: 0 % (ref 0.0–0.2)

## 2022-01-31 LAB — COMPREHENSIVE METABOLIC PANEL
ALT: 25 U/L (ref 0–44)
AST: 34 U/L (ref 15–41)
Albumin: 2.9 g/dL — ABNORMAL LOW (ref 3.5–5.0)
Alkaline Phosphatase: 32 U/L — ABNORMAL LOW (ref 38–126)
Anion gap: 4 — ABNORMAL LOW (ref 5–15)
BUN: 26 mg/dL — ABNORMAL HIGH (ref 8–23)
CO2: 21 mmol/L — ABNORMAL LOW (ref 22–32)
Calcium: 8.4 mg/dL — ABNORMAL LOW (ref 8.9–10.3)
Chloride: 113 mmol/L — ABNORMAL HIGH (ref 98–111)
Creatinine, Ser: 1.35 mg/dL — ABNORMAL HIGH (ref 0.61–1.24)
GFR, Estimated: 56 mL/min — ABNORMAL LOW (ref >60)
Glucose, Bld: 60 mg/dL — ABNORMAL LOW (ref 70–99)
Potassium: 3.8 mmol/L (ref 3.5–5.1)
Sodium: 138 mmol/L (ref 135–145)
Total Bilirubin: 0.8 mg/dL (ref 0.3–1.2)
Total Protein: 5.6 g/dL — ABNORMAL LOW (ref 6.5–8.1)

## 2022-01-31 LAB — GLUCOSE, CAPILLARY
Glucose-Capillary: 52 mg/dL — ABNORMAL LOW (ref 70–99)
Glucose-Capillary: 56 mg/dL — ABNORMAL LOW (ref 70–99)
Glucose-Capillary: 72 mg/dL (ref 70–99)
Glucose-Capillary: 211 mg/dL — ABNORMAL HIGH (ref 70–99)

## 2022-01-31 LAB — HEPARIN LEVEL (UNFRACTIONATED): Heparin Unfractionated: 0.87 IU/mL — ABNORMAL HIGH (ref 0.30–0.70)

## 2022-01-31 LAB — MAGNESIUM: Magnesium: 1.6 mg/dL — ABNORMAL LOW (ref 1.7–2.4)

## 2022-01-31 LAB — PHOSPHORUS: Phosphorus: 3 mg/dL (ref 2.5–4.6)

## 2022-01-31 MED ORDER — RIVAROXABAN 15 MG PO TABS
15.0000 mg | ORAL_TABLET | Freq: Two times a day (BID) | ORAL | Status: DC
  Administered 2022-01-31: 15 mg via ORAL
  Filled 2022-01-31: qty 1

## 2022-01-31 MED ORDER — RIVAROXABAN (XARELTO) VTE STARTER PACK (15 & 20 MG): ORAL_TABLET | ORAL | 0 refills | Status: DC

## 2022-01-31 MED ORDER — RIVAROXABAN 20 MG PO TABS: 20.0000 mg | ORAL_TABLET | Freq: Every day | ORAL | Status: DC

## 2022-01-31 MED ORDER — MAGNESIUM SULFATE 2 GM/50ML IV SOLN
2.0000 g | Freq: Once | INTRAVENOUS | Status: AC
  Administered 2022-01-31: 2 g via INTRAVENOUS
  Filled 2022-01-31: qty 50

## 2022-01-31 NOTE — Evaluation (Signed)
Physical Therapy Evaluation & Discharge  Patient Details Name: Alan Morrison MRN: [id number] DOB: 1950/05/23 Today's Date: 01/31/2022  History of Present Illness  71 y/o male presented to ED on 01/29/22 for R LE swelling and pain. Found to have iliofemoral DVT. S/p mechanical thrombectomy of popliteal vein on 10/13. PMH: hx of LLE DVT, CAD, T2DM, OSA  Clinical Impression  Patient admitted with the above. PTA, patient lives with wife and independent. Currently functioning at modI level with use of IV pole during mobility due to soreness but improved with distance. Patient declined need to practice stair negotiation but educated on up with good LE and down with bad LE. Encouraged continued mobility for improving soreness of R LE. No further skilled PT needs identified acutely. No PT follow up recommended at this time. PT will sign off.      Recommendations for follow up therapy are one component of a multi-disciplinary discharge planning process, led by the attending physician.  Recommendations may be updated based on patient status, additional functional criteria and insurance authorization.  Follow Up Recommendations No PT follow up      Assistance Recommended at Discharge PRN  Patient can return home with the following       Equipment Recommendations None recommended by PT  Recommendations for Other Services       Functional Status Assessment Patient has had a recent decline in their functional status and demonstrates the ability to make significant improvements in function in a reasonable and predictable amount of time.     Precautions / Restrictions Precautions Precautions: None Restrictions Weight Bearing Restrictions: No      Mobility  Bed Mobility Overal bed mobility: Independent                  Transfers Overall transfer level: Modified independent Equipment used: None                    Ambulation/Gait Ambulation/Gait assistance: Modified  independent (Device/Increase time) Gait Distance (Feet): 300 Feet Assistive device: IV Pole Gait Pattern/deviations: WFL(Within Functional Limits) Gait velocity: decreased     General Gait Details: initially unsteady but due to soreness. With time, able to ambulate modI with IV pole and decrease in soreness  Stairs Stairs:  (declines need to practice stairs. Educated on up with good LE and down with bad LE)          Wheelchair Mobility    Modified Rankin (Stroke Patients Only)       Balance Overall balance assessment: Mild deficits observed, not formally tested                                           Pertinent Vitals/Pain Pain Assessment Pain Assessment: Faces Faces Pain Scale: Hurts little more Pain Location: R LE Pain Descriptors / Indicators: Sore Pain Intervention(s): Monitored during session    Home Living Family/patient expects to be discharged to:: Private residence Living Arrangements: Spouse/significant other Available Help at Discharge: Family Type of Home: House Home Access: Stairs to enter Entrance Stairs-Rails: Left Entrance Stairs-Number of Steps: 5   Home Layout: One level Home Equipment: None      Prior Function Prior Level of Function : Independent/Modified Independent;Driving                     Hand Dominance        Extremity/Trunk  Assessment   Upper Extremity Assessment Upper Extremity Assessment: Overall WFL for tasks assessed    Lower Extremity Assessment Lower Extremity Assessment: Overall WFL for tasks assessed    Cervical / Trunk Assessment Cervical / Trunk Assessment: Normal  Communication   Communication: No difficulties  Cognition Arousal/Alertness: Awake/alert Behavior During Therapy: WFL for tasks assessed/performed Overall Cognitive Status: Within Functional Limits for tasks assessed                                          General Comments      Exercises      Assessment/Plan    PT Assessment Patient does not need any further PT services  PT Problem List         PT Treatment Interventions      PT Goals (Current goals can be found in the Care Plan section)  Acute Rehab PT Goals Patient Stated Goal: to go home PT Goal Formulation: All assessment and education complete, DC therapy    Frequency       Co-evaluation               AM-PAC PT "6 Clicks" Mobility  Outcome Measure Help needed turning from your back to your side while in a flat bed without using bedrails?: None Help needed moving from lying on your back to sitting on the side of a flat bed without using bedrails?: None Help needed moving to and from a bed to a chair (including a wheelchair)?: None Help needed standing up from a chair using your arms (e.g., wheelchair or bedside chair)?: None Help needed to walk in hospital room?: None Help needed climbing 3-5 steps with a railing? : None 6 Click Score: 24    End of Session Equipment Utilized During Treatment: Gait belt Activity Tolerance: Patient tolerated treatment well Patient left: in chair;with call bell/phone within reach;with family/visitor present Nurse Communication: Mobility status PT Visit Diagnosis: Muscle weakness (generalized) (M62.81)    Time: 4680-3212 PT Time Calculation (min) (ACUTE ONLY): 24 min   Charges:   PT Evaluation $PT Eval Low Complexity: 1 Low PT Treatments $Therapeutic Activity: 8-22 mins        Samara Stankowski A. Gilford Rile PT, DPT Acute Rehabilitation Services Office [phone]   Linna Hoff 01/31/2022, 9:37 AM

## 2022-01-31 NOTE — Progress Notes (Addendum)
  Progress Note    01/31/2022 9:10 AM 1 Day Post-Op  Subjective:  Ready for discharge home   Vitals:   01/31/22 0300 01/31/22 0732  BP: (!) 103/49 128/74  Pulse: 79 78  Resp: 12 18  Temp: 97.6 F (36.4 C) 97.8 F (36.6 C)  SpO2: 95% 100%   Physical Exam Lungs:  non labored Incisions:  R popliteal cath site without hematoma Extremities:  R leg more edematous than L; feet are warm and well perfused Neurologic: A&O  CBC    Component Value Date/Time   WBC 8.3 01/31/2022 0632   RBC 3.68 (L) 01/31/2022 0632   HGB 11.3 (L) 01/31/2022 0632   HGB 14.6 06/28/2018 0849   HGB 15.6 03/25/2017 1225   HCT 32.6 (L) 01/31/2022 0632   HCT 45.8 03/25/2017 1225   PLT 128 (L) 01/31/2022 0632   PLT 200 06/28/2018 0849   PLT 228 03/25/2017 1225   MCV 88.6 01/31/2022 0632   MCV 90 03/25/2017 1225   MCH 30.7 01/31/2022 0632   MCHC 34.7 01/31/2022 0632   RDW 13.1 01/31/2022 0632   RDW 13.6 03/25/2017 1225   LYMPHSABS 1.4 01/29/2022 1057   MONOABS 0.6 01/29/2022 1057   EOSABS 0.2 01/29/2022 1057   BASOSABS 0.0 01/29/2022 1057    BMET    Component Value Date/Time   NA 138 01/31/2022 0632   NA 138 03/25/2017 1225   K 3.8 01/31/2022 0632   CL 113 (H) 01/31/2022 0632   CO2 21 (L) 01/31/2022 0632   GLUCOSE 60 (L) 01/31/2022 0632   BUN 26 (H) 01/31/2022 0632   BUN 29 (H) 03/25/2017 1225   CREATININE 1.35 (H) 01/31/2022 0632   CREATININE 1.27 (H) 11/01/2018 0807   CALCIUM 8.4 (L) 01/31/2022 0632   GFRNONAA 56 (L) 01/31/2022 0632   GFRNONAA 58 (L) 11/01/2018 0807   GFRAA >60 11/20/2019 1215   GFRAA >60 11/01/2018 0807    INR    Component Value Date/Time   INR 1.2 11/12/2019 1143     Intake/Output Summary (Last 24 hours) at 01/31/2022 0910 Last data filed at 01/31/2022 0735 Gross per 24 hour  Intake 3812.28 ml  Output 1950 ml  Net 1862.28 ml     Assessment/Plan:  71 y.o. male is s/p R iliofemoral DVT mechanical thrombectomy  1 Day Post-Op   Subjectively R leg  feels less tight after the procedure Sutures removed from R popliteal space Measure for and apply compression sock this morning Ok for transition from heparin to [facility] for discharge.  Office will arrange 4-6 week IVC/ R iliac duplex    Dagoberto Ligas, PA-C [facility] [phone] 01/31/2022 9:10 AM  I have seen and evaluated the patient. I agree with the PA note as documented above.  Postop day 1 status post right lower extremity mechanical venous thrombectomy including thrombectomy of the IVC.  Right leg looks much better today.  I discussed I suspect some of this is acute on chronic given the findings on CT venogram as well as the hard calcium that we retrieved in the procedure.  He is being transition to Xarelto today and I suspect he will need lifelong anticoagulation given this is a second DVT.  We will give him a compression stocking today.  Follow-up in 4 to 6 weeks with right leg duplex including iliac and IVC.  Marty Heck, MD [facility] [place] Office: [phone]

## 2022-01-31 NOTE — Progress Notes (Signed)
Patient given discharge instructions. Wife present. PIVs removed. Telemetry box removed, CCMD notified. Patient taken to vehicle in wheelchair by staff.  Daymon Larsen, RN

## 2022-01-31 NOTE — Discharge Summary (Signed)
Physician Discharge Summary  Abbas Beyene OXB:[id number] DOB: 01-Apr-1951 DOA: 01/29/2022  PCP: [name], MD  Admit date: 01/29/2022 Discharge date: 01/31/2022  Admitted From: home Disposition:  home  Recommendations for Outpatient Follow-up:  Follow up with PCP in 1-2 weeks Please obtain BMP/CBC in one week  Home Health: none Equipment/Devices: none  Discharge Condition: stable CODE STATUS: Full code Diet Orders (From admission, onward)     Start     Ordered   01/30/22 1417  Diet Carb Modified Fluid consistency: Thin; Room service appropriate? Yes  Diet effective now       Question Answer Comment  Diet-HS Snack? Nothing   Calorie Level Medium 1600-2000   Fluid consistency: Thin   Room service appropriate? Yes      01/30/22 1417            HPI: Per admitting MD, 71 y.o. Caucasian male with medical history significant for coronary artery disease, type II diabetes mellitus, left lower extremity DVT, GERD, dyslipidemia, OSA, and fatty liver, who presented to the emergency room with acute onset of worsening right lower extremity swelling over the last couple of days which has become more diffuse with tense swelling of the thigh and leg and worsening pain.  The patient has a history of left lower extremity DVT about 3 years ago for which she took Xarelto for a year and stopped it a couple of years ago.  He has mild dyspnea however without worsening lately.  He denies any cough or wheezing or hemoptysis.  No recent travels or surgeries.  He stated that his first left lower extremity DVT was unprovoked as well.  He denied any lower extremity paresthesias or focal muscle weakness.  He denies any bleeding diathesis.  No chest pain or palpitations.  No dysuria, oliguria or hematuria or flank pain.  No melena or blood in urine per rectum.  No headache or dizziness or blurred vision.  Denies any nausea or vomiting or abdominal pain.  Hospital Course / Discharge  diagnoses: Principal Problem:   Acute deep vein thrombosis (DVT) of iliac vein of right lower extremity (HCC) Active Problems:   Type 2 diabetes mellitus with chronic kidney disease, with long-term current use of insulin (HCC)   Coronary artery disease   Dyslipidemia   Essential hypertension   BPH (benign prostatic hyperplasia)   Chronic kidney disease, stage 3a (HCC)   Principal problem Acute DVT, occlusive thrombus in the external iliac, common femoral, superficial femoral, and deep femoral veins on the right -vascular surgery consulted, patient was taken to the OR on 10/13 and he is status post percutaneous mechanical venous thrombectomy of the right popliteal, superficial femoral, common femoral, iliac vein and distal IVC; Percutaneous mechanical venous thrombectomy of the right popliteal, superficial femoral, common femoral, iliac vein and distal IVC; Angioplasty of the right iliac vein and distal IVC (10 mm x 60 mm Mustang).  He recovered well postoperatively, leg swelling has improved significantly, he is able to ambulate in the hallway without significant difficulties and will be discharged home in stable condition.  He was started on Xarelto 70   Active problems Acute PE-with moderate burden, clinically without chest pain, tachycardia, significant hypotension.  2D echo shows an EF of 68-34%, grade 1 diastolic dysfunction, RV moderately enlarged with normal systolic function.  He is normotensive, satting well on room air, able to ambulate in the hallway without significant dyspnea and stable for discharge  CAD-with prior stenting, currently on aspirin, statin, ARB, beta-blocker Essential hypertension-continue  home medications as below Hyperlipidemia-continue statin BPH-continue Flomax CKD 3A-creatinine at baseline 1.4 cm indeterminate left adrenal nodule-outpatient follow-up, repeat CT scan in 1 year. 5.3 mm right lower lobe pulmonary nodule-outpatient follow-up DM2-resume home  medications  Sepsis ruled out   Discharge Instructions   Allergies as of 01/31/2022   No Known Allergies      Medication List     TAKE these medications    acetaminophen 500 MG tablet Commonly known as: TYLENOL Take 1,000 mg by mouth 2 (two) times daily as needed for headache (pain).   aspirin EC 81 MG tablet Take 81 mg by mouth daily with supper. Swallow whole.   Basaglar KwikPen 100 UNIT/ML Inject 100 Units into the skin at bedtime.   finasteride 5 MG tablet Commonly known as: PROSCAR TAKE 1 TABLET BY MOUTH EVERY DAY   Insulin Pen Needle 31G X 8 MM Misc Commonly known as: B-D ULTRAFINE III SHORT PEN 1 Device by Other route 4 (four) times daily. USE AS DIRECTED DAILY   Iron (Ferrous Sulfate) 325 (65 Fe) MG Tabs Take 325 mg by mouth every other day.   losartan 25 MG tablet Commonly known as: COZAAR Take 1 tablet (25 mg total) by mouth daily.   metFORMIN 1000 MG tablet Commonly known as: GLUCOPHAGE Take 1 tablet (1,000 mg total) by mouth daily with breakfast. TAKE 1 TABLET BY MOUTH EVERY DAY AT BREAKFAST Strength: 1,000 mg What changed:  how much to take when to take this additional instructions   metoprolol tartrate 25 MG tablet Commonly known as: LOPRESSOR TAKE 0.5 TABLETS BY MOUTH 2 TIMES DAILY. What changed:  how much to take how to take this when to take this additional instructions   multivitamin with minerals Tabs tablet Take 1 tablet by mouth daily with breakfast.   nitroGLYCERIN 0.4 MG SL tablet Commonly known as: NITROSTAT PLACE 1 TABLET UNDER THE TONGUE EVERY 5 MINUTES AS NEEDED FOR CHEST PAIN What changed: See the new instructions.   OneTouch Ultra test strip Generic drug: glucose blood USE TO CHECK SUGARS DAILY  AS DIRECTED   probenecid 500 MG tablet Commonly known as: BENEMID Take 0.5 tablets (250 mg total) by mouth every other day.   Rivaroxaban Stater Pack (15 mg and 20 mg) Commonly known as: XARELTO STARTER PACK Follow  package directions: Take one '15mg'$  tablet by mouth twice a day. On day 22, switch to one '20mg'$  tablet once a day. Take with food.   rosuvastatin 40 MG tablet Commonly known as: CRESTOR Take 1 tablet (40 mg total) by mouth daily with supper.   tamsulosin 0.4 MG Caps capsule Commonly known as: FLOMAX Take 1 capsule (0.4 mg total) by mouth daily.   Trulicity 4.40 HK/7.4QV Sopn Generic drug: Dulaglutide Inject 0.75 mg into the skin once a week.   TUMS PO Take 1 tablet by mouth daily as needed (acid reflux/indigestion).   Vitamin D 50 MCG (2000 UT) tablet Take 2,000 Units by mouth daily with breakfast.        Follow-up Information     Broadus John, MD Follow up in 1 month(s).   Specialty: Vascular Surgery Contact information: [place] [address] [phone]         [name], MD .   Specialty: [facility] Contact information: [place] [address] [phone]                 Consultations: Vascular surgery   Procedures/Studies  ECHOCARDIOGRAM COMPLETE  Result Date: 01/30/2022    ECHOCARDIOGRAM REPORT   Patient Name:   HILL MACKIE Date of Exam: 01/30/2022 Medical Rec #:  [id number]     Height:       67.5 in Accession #:    [id number]    Weight:       205.0 lb Date of Birth:  09-10-1950     BSA:          2.055 m Patient Age:    11 years      BP:           107/68 mmHg Patient Gender: M             HR:           77 bpm. Exam Location:  Inpatient Procedure: 2D Echo, Cardiac Doppler and Color Doppler Indications:    Pulmonary embolus  History:        Patient has prior history of Echocardiogram examinations, most                 recent 01/04/2022. CAD; Risk Factors:Dyslipidemia, Diabetes and                 Sleep Apnea.  Sonographer:    Jefferey Pica Referring Phys: [id number] JAN A [place]  1. Left ventricular ejection fraction, by estimation, is 50 to 55%. The left ventricle has low normal function. The  left ventricle has no regional wall motion abnormalities. There is moderate concentric left ventricular hypertrophy. Left ventricular diastolic parameters are consistent with Grade I diastolic dysfunction (impaired relaxation). There is the interventricular septum is flattened in systole, consistent with right ventricular pressure overload.  2. +Mconnell's sign. Right ventricular systolic function is normal. The right ventricular size is moderately enlarged.  3. No evidence of mitral valve regurgitation.  4. The aortic valve is tricuspid. Aortic valve regurgitation is not visualized.  5. The inferior vena cava is normal in size with greater than 50% respiratory variability, suggesting right atrial pressure of 3 mmHg. Conclusion(s)/Recommendation(s): RV dilation and septal flattening consistent with pulmonary embolism with RV strain. FINDINGS  Left Ventricle: Left ventricular ejection fraction, by estimation, is 50 to 55%. The left ventricle has low normal function. The left ventricle has no regional wall motion abnormalities. The left ventricular internal cavity size was normal in size. There is moderate concentric left ventricular hypertrophy. The interventricular septum is flattened in systole, consistent with right ventricular pressure overload. Left ventricular diastolic parameters are consistent with Grade I diastolic dysfunction (impaired relaxation). Right Ventricle: +Mconnell's sign. The right ventricular size is moderately enlarged. Right ventricular systolic function is normal. Left Atrium: Left atrial size was normal in size. Right Atrium: Right atrial size was normal in size. Pericardium: Trivial pericardial effusion is present. Mitral Valve: No evidence of mitral valve regurgitation. Tricuspid Valve: The tricuspid valve is normal in structure. Tricuspid valve regurgitation is not demonstrated. Aortic Valve: The aortic valve is tricuspid. Aortic valve regurgitation is not visualized. Aortic valve peak  gradient measures 6.5 mmHg. Pulmonic Valve: Pulmonic valve regurgitation is trivial. Aorta: The aortic root and ascending aorta are structurally normal, with no evidence of dilitation. Venous: The inferior vena cava is normal in size with greater than 50% respiratory variability, suggesting right atrial pressure of 3 mmHg. IAS/Shunts: No atrial level shunt detected by color flow Doppler.  LEFT VENTRICLE PLAX 2D LVIDd:         4.40 cm   Diastology LVIDs:         3.30 cm  LV e' medial:    5.84 cm/s LV PW:         1.30 cm   LV E/e' medial:  15.7 LV IVS:        1.30 cm   LV e' lateral:   8.92 cm/s LVOT diam:     1.90 cm   LV E/e' lateral: 10.3 LV SV:         65 LV SV Index:   32 LVOT Area:     2.84 cm  RIGHT VENTRICLE             IVC RV Basal diam:  3.20 cm     IVC diam: 1.90 cm RV S prime:     19.50 cm/s TAPSE (M-mode): 2.6 cm LEFT ATRIUM             Index        RIGHT ATRIUM           Index LA diam:        3.70 cm 1.80 cm/m   RA Area:     19.50 cm LA Vol (A2C):   34.2 ml 16.64 ml/m  RA Volume:   63.80 ml  31.05 ml/m LA Vol (A4C):   37.5 ml 18.25 ml/m LA Biplane Vol: 39.6 ml 19.27 ml/m  AORTIC VALVE                 PULMONIC VALVE AV Area (Vmax): 2.40 cm     PV Vmax:       0.77 m/s AV Vmax:        127.50 cm/s  PV Peak grad:  2.3 mmHg AV Peak Grad:   6.5 mmHg LVOT Vmax:      108.00 cm/s LVOT Vmean:     72.300 cm/s LVOT VTI:       0.229 m  AORTA Ao Root diam: 3.50 cm Ao Asc diam:  3.30 cm MITRAL VALVE MV Area (PHT): 4.60 cm     SHUNTS MV Decel Time: 165 msec     Systemic VTI:  0.23 m MV E velocity: 91.90 cm/s   Systemic Diam: 1.90 cm MV A velocity: 100.00 cm/s MV E/A ratio:  0.92 Mary Scientist, physiological signed by Phineas Inches Signature Date/Time: 01/30/2022/4:04:36 PM    Final    PERIPHERAL VASCULAR CATHETERIZATION  Result Date: 01/30/2022 Table formatting from the original result was not included. Images from the original result were not included.   Patient name: Parker Wherley  MRN: [id number]        DOB:  Apr 28, 1950          Sex: male  01/30/2022 Pre-operative Diagnosis: Extensive right leg DVT with iliofemoral and IVC involvement Post-operative diagnosis:  Same Surgeon:  Marty Heck, MD Procedure Performed: 1.  Ultrasound-guided access right popliteal vein 2.  Intravascular ultrasound (IVUS) of right popliteal, superficial femoral, common femoral, iliac vein and IVC 3.  Percutaneous mechanical venous thrombectomy of the right popliteal, superficial femoral, common femoral, iliac vein and distal IVC (Inari Clottriever) 4.  Percutaneous mechanical venous thrombectomy of the right popliteal, superficial femoral, common femoral, iliac vein and distal IVC (CAT 8 penumbra) 5.  Angioplasty of the right iliac vein and distal IVC (10 mm x 60 mm Mustang) 6.  98 minutes of monitored moderate conscious sedation time  Indications: Patient is a 71 year old male seen last night in ED by Dr. Virl Cagey with extensive right leg DVT with thrombus into the vena cava.  He presents today for percutaneous thrombectomy after risk benefits  discussed.  Findings:  Right popliteal vein was accessed with the patient in the prone position.  IVUS initially showed thrombus in the right popliteal, superficial femoral, common femoral, iliac vein into the distal IVC with some hyperechoic areas suggesting more chronicity.  Initially used the [employer] in the vena cava above the thrombus and the device would not come down and appeared to be caught.  Ultimately we were able to retrieve what appeared to be very hard substance consistent with calcium in the distal IVC.  I did not think putting this device back in was a good idea given the difficulty we had with the initial attempt.  I then used the penumbra CAT 8 and got a lot of acute as well as some chronic appearing thrombus from the right leg into the IVC.  The right iliac as well as distal IVC was angioplastied with a 10 mm x 60 mm Mustang and patient now has a patent flow channel at  completion.             Procedure:  The patient was identified in the holding area and taken to room 8.  The patient was placed prone on the table.  Timeout was performed.  Initially evaluated the right popliteal vein with ultrasound, it was patent and image was saved.  This was accessed with micro access needle and placed a microwire and then a microsheath.  I then advanced a Glidewire advantage into the inferior vena cava and then exchanged for a 8 French sheath in the right popliteal vein.  Patient was given another 4000 units of IV heparin.  We checked an ACT to maintain greater than 250.  I then used IVUS and performed IVUS of the right popliteal, superficial femoral, common femoral, iliac vein, and IVC with findings as noted above.  I then elected to attempt Inari clottreiver and placed the 13 [place] Inari sheath in the right popliteal vein and then placed the device into the inferior vena cava once we had a wire into the left subclavian vein.  I then opened the device in the health patent vena cava and attempted to pull it through the distal IVC and ultimately the device caught and would not come down as expected.  Using a number of maneuvers we were finally able to get the device down through the IVC but then I could not get it totally restrained to come out of the sheath and I removed the entire device and sheath over the wire leaving the wire in place and the device was removed in its entirety.  There was a large piece of hard substance like calcium that was retrieved from the vena cava that will be sent to the pathologist.  I then placed a new 13 [place] Inari sheath over a wire given we still had wire access.  I elected to not use innari clottriever anymore given the difficulty we had and then decided to use a penumbra CAT 8 and we performed multiple passes with percutaneous thrombectomy.  EBL was about 400 mL.  We did establish a flow channel and I ultimately ended up performing balloon angioplasty of  the right iliac vein and distal IVC with a 10 mm x 60 mm Mustang.  Wires and catheters were removed.  We pulled the sheath and tied down the pursestring with 4-0 monocryl with good hemostasis.  Plan: Do not stop heparin and this was continued through the procedure and patient can be transitioned to a DOAC.   Harrell Gave  Addison Naegeli, MD [facility] Office: [place]  Result Date: 01/30/2022 Table formatting from the original result was not included. Images from the original result were not included.   Patient name: Nakoa Ganus  MRN: [id number]        DOB: 1950-09-15          Sex: male  01/30/2022 Pre-operative Diagnosis: Extensive right leg DVT with iliofemoral and IVC involvement Post-operative diagnosis:  Same Surgeon:  Marty Heck, MD Procedure Performed: 1.  Ultrasound-guided access right popliteal vein 2.  Intravascular ultrasound (IVUS) of right popliteal, superficial femoral, common femoral, iliac vein and IVC 3.  Percutaneous mechanical venous thrombectomy of the right popliteal, superficial femoral, common femoral, iliac vein and distal IVC (Inari Clottriever) 4.  Percutaneous mechanical venous thrombectomy of the right popliteal, superficial femoral, common femoral, iliac vein and distal IVC (CAT 8 penumbra) 5.  Angioplasty of the right iliac vein and distal IVC (10 mm x 60 mm Mustang) 6.  98 minutes of monitored moderate conscious sedation time  Indications: Patient is a 71 year old male seen last night in ED by Dr. Virl Cagey with extensive right leg DVT with thrombus into the vena cava.  He presents today for percutaneous thrombectomy after risk benefits discussed.  Findings:  Right popliteal vein was accessed with the patient in the prone position.  IVUS initially showed thrombus in the right popliteal, superficial femoral, common femoral, iliac vein into the distal IVC with some hyperechoic areas suggesting more chronicity.   Initially used the [employer] in the vena cava above the thrombus and the device would not come down and appeared to be caught.  Ultimately we were able to retrieve what appeared to be very hard substance consistent with calcium in the distal IVC.  I did not think putting this device back in was a good idea given the difficulty we had with the initial attempt.  I then used the penumbra CAT 8 and got a lot of acute as well as some chronic appearing thrombus from the right leg into the IVC.  The right iliac as well as distal IVC was angioplastied with a 10 mm x 60 mm Mustang and patient now has a patent flow channel at completion.             Procedure:  The patient was identified in the holding area and taken to room 8.  The patient was placed prone on the table.  Timeout was performed.  Initially evaluated the right popliteal vein with ultrasound, it was patent and image was saved.  This was accessed with micro access needle and placed a microwire and then a microsheath.  I then advanced a Glidewire advantage into the inferior vena cava and then exchanged for a 8 French sheath in the right popliteal vein.  Patient was given another 4000 units of IV heparin.  We checked an ACT to maintain greater than 250.  I then used IVUS and performed IVUS of the right popliteal, superficial femoral, common femoral, iliac vein, and IVC with findings as noted above.  I then elected to attempt Inari clottreiver and placed the 13 [place] Inari sheath in the right popliteal vein and then placed the device into the inferior vena cava once we had a wire into the left subclavian vein.  I then opened the device in the health patent vena cava and attempted to pull it through the distal IVC and ultimately the device caught and  would not come down as expected.  Using a number of maneuvers we were finally able to get the device down through the IVC but then I could not get it totally restrained to come out of the sheath and I removed  the entire device and sheath over the wire leaving the wire in place and the device was removed in its entirety.  There was a large piece of hard substance like calcium that was retrieved from the vena cava that will be sent to the pathologist.  I then placed a new 13 [place] Inari sheath over a wire given we still had wire access.  I elected to not use innari clottriever anymore given the difficulty we had and then decided to use a penumbra CAT 8 and we performed multiple passes with percutaneous thrombectomy.  EBL was about 400 mL.  We did establish a flow channel and I ultimately ended up performing balloon angioplasty of the right iliac vein and distal IVC with a 10 mm x 60 mm Mustang.  Wires and catheters were removed.  We pulled the sheath and tied down the pursestring with 4-0 monocryl with good hemostasis.  Plan: Do not stop heparin and this was continued through the procedure and patient can be transitioned to a DOAC.   Marty Heck, MD [facility] Office: [phone]      CT VENOGRAM ABD/PELVIS/LOWER EXT BILAT  Result Date: 01/29/2022 CLINICAL DATA:  Deep venous thrombosis, right lower extremity swelling with extensive known DVT and pulmonary emboli. EXAM: CT VENOGRAM ABDOMEN AND PELVIS AND LOWER EXTREMITY BILATERAL TECHNIQUE: Multidetector CT imaging of the abdomen and pelvis and upper legs bilaterally was performed using the standard protocol during bolus administration of intravenous contrast. Multiplanar reconstructed images and MIPS were obtained and reviewed to evaluate the vascular anatomy. RADIATION DOSE REDUCTION: This exam was performed according to the departmental dose-optimization program which includes automated exposure control, adjustment of the mA and/or kV according to patient size and/or use of iterative reconstruction technique. CONTRAST:  163m OMNIPAQUE IOHEXOL 350 MG/ML SOLN COMPARISON:  None Available. FINDINGS: Lower chest: The heart is  enlarged and multi-vessel coronary artery calcifications are noted. Mild atelectasis and scarring present at the lung bases. A 3 mm nodule is present in the right lower lobe, axial image 10, not seen on recent CTA chest. Hepatobiliary: No focal liver abnormality is seen. Mild hepatic steatosis. No gallstones, gallbladder wall thickening, or biliary dilatation. Pancreas: Mild fatty atrophy. No pancreatic ductal dilatation or surrounding inflammatory changes. Spleen: Normal in size without focal abnormality. Adrenals/Urinary Tract: There is a nodule in the left adrenal gland measuring 1.4 cm with attenuation of 31 Hounsfield units. The right adrenal gland is within normal limits. The kidneys enhance symmetrically. No renal calculus or hydronephrosis. The bladder is unremarkable. Stomach/Bowel: Stomach is within normal limits. Appendix is not seen. No evidence of bowel wall thickening, distention, or inflammatory changes. No free air or pneumatosis. Vascular/Lymphatic: Aortic atherosclerosis without evidence of aneurysm. No abdominal or pelvic lymphadenopathy. Reproductive: Prostate gland is enlarged. Bilateral hydroceles are noted. Other: No abdominopelvic ascites. Small fat containing umbilical hernia. Musculoskeletal: There is diffuse subcutaneous edema and fat stranding in the right lower extremity. A small joint effusion is noted at the right knee. Degenerative changes are present in the thoracolumbar spine. No acute osseous abnormality. IVC: Calcification is noted in the distal IVC and proximal common iliac arteries bilaterally. Nonocclusive thrombus is present in the distal IVC. Portal and mesenteric veins: No evidence  for thrombus or stenosis. Bilateral iliac veins: Nonocclusive thrombus is present in the common iliac vein on the right. There is minimal nonocclusive thrombus in the common iliac vein on the left. There is occlusive thrombus in the external iliac vein on the right. The external iliac vein on the  left is patent. The internal iliac veins are not well visualized on exam. Right lower extremity: There is a occlusive thrombus involving the common femoral, femoral, and superficial femoral veins on the right. Suboptimal opacification of the popliteal vein. Left lower extremity: The common femoral, femoral, and superficial femoral veins are patent. There is suboptimal opacification of the popliteal vein. IMPRESSION: 1. Nonocclusive thrombus and calcification in the distal inferior vena cava and common iliac arteries bilaterally. 2. Occlusive thrombus in the external iliac, common femoral, superficial femoral, and deep femoral veins on the right. 3. Asymmetric enlargement of the right lower extremity with edema. 4. Indeterminate left adrenal nodule measuring 1.4 cm. Recommend follow-up adrenal washout CT in 1 year. 5. 3 mm right lower lobe pulmonary nodule, not seen on previous CTA chest. No follow-up needed if patient is low-risk.This recommendation follows the consensus statement: Guidelines for Management of Incidental Pulmonary Nodules Detected on CT Images: From the Fleischner Society 2017; Radiology 2017; 284:228-243. 6. Aortic atherosclerosis and coronary artery calcifications. 7. Remaining incidental findings as described above. Electronically Signed   By: Brett Fairy M.D.   On: 01/29/2022 23:40   CT Angio Chest Pulmonary Embolism (PE) W or WO Contrast  Addendum Date: 01/29/2022   ADDENDUM REPORT: 01/29/2022 22:37 ADDENDUM: Critical findings were reported to Dr. Sidney Ace at 10:37 p.m. Electronically Signed   By: Brett Fairy M.D.   On: 01/29/2022 22:37   Result Date: 01/29/2022 CLINICAL DATA:  Pulmonary embolism suspected, high probability. History of prior DVT. EXAM: CT ANGIOGRAPHY CHEST WITH CONTRAST TECHNIQUE: Multidetector CT imaging of the chest was performed using the standard protocol during bolus administration of intravenous contrast. Multiplanar CT image reconstructions and MIPs were  obtained to evaluate the vascular anatomy. RADIATION DOSE REDUCTION: This exam was performed according to the departmental dose-optimization program which includes automated exposure control, adjustment of the mA and/or kV according to patient size and/or use of iterative reconstruction technique. CONTRAST:  183m OMNIPAQUE IOHEXOL 350 MG/ML SOLN COMPARISON:  03/17/2017. FINDINGS: Cardiovascular: Heart is enlarged and there is no pericardial effusion. Multi-vessel coronary artery calcifications are noted. There is atherosclerotic calcification of the aorta without evidence of aneurysm. The pulmonary trunk is normal in caliber. Pulmonary emboli are identified in the lobar, segmental, and subsegmental pulmonary emboli in the right upper and lower lobes. A pulmonary embolus is noted in the left lower lobe barb pulmonary artery. The right ventricle is distended suggesting underlying right heart strain. Mediastinum/Nodes: No mediastinal or axillary lymphadenopathy. Mild lymphadenopathy is noted in the hilar regions bilaterally. The thyroid gland, trachea, and esophagus are within normal limits. Lungs/Pleura: Mild hazy regional ground-glass opacities are present bilaterally. No consolidation, effusion, or pneumothorax. Upper Abdomen: No acute abnormality. Musculoskeletal: Degenerative changes are present in the thoracic spine. No acute osseous abnormality. Review of the MIP images confirms the above findings. IMPRESSION: 1. Bilateral pulmonary emboli with moderate thrombus burden. The right ventricle is distended suggesting right heart strain. 2. Hazy ground-glass opacities in the lungs, possible edema or pneumonitis. 3. Cardiomegaly with multi-vessel coronary artery calcifications. 4. Aortic atherosclerosis. Electronically Signed: By: LBrett FairyM.D. On: 01/29/2022 22:00   VAS [place]/ILIAC (VENOUS ONLY)  Result Date: 01/29/2022 IVC/ILIAC STUDY Patient Name:  [name]  Date of Exam:   01/29/2022 Medical Rec  #: [id number]      Accession #:    [id number] Date of Birth: January 08, 1951      Patient Gender: M Patient Age:   89 years Exam Location:  [facility] Procedure:      VAS [place] IVC/ILIAC (VENOUS ONLY) Referring Phys: KEN LE --------------------------------------------------------------------------------  Indications: Continuous flow obseved throughout the right lower extremity with              associated swelling, pain, and shortness of breath. History of left              lower extremity DVT. Limitations: Body habitus.  Comparison Study: 01-29-2022 Right lower extremity venous study. Performing Technologist: Darlin Coco RDMS, RVT  Examination Guidelines: A complete evaluation includes B-mode imaging, spectral Doppler, color Doppler, and power Doppler as needed of all accessible portions of each vessel. Bilateral testing is considered an integral part of a complete examination. Limited examinations for reoccurring indications may be performed as noted.  IVC/Iliac Findings: +----------+------+--------+--------+    IVC    PatentThrombusComments +----------+------+--------+--------+ IVC Prox  patent                 +----------+------+--------+--------+ IVC Mid   patent                 +----------+------+--------+--------+ IVC Distalpatent                 +----------+------+--------+--------+  +-------------------+---------+-----------+---------+-----------+--------+         CIV        RT-PatentRT-ThrombusLT-PatentLT-ThrombusComments +-------------------+---------+-----------+---------+-----------+--------+ Common Iliac Prox   patent              patent                      +-------------------+---------+-----------+---------+-----------+--------+ Common Iliac Mid               acute    patent                      +-------------------+---------+-----------+---------+-----------+--------+ Common Iliac Distal            acute    patent                       +-------------------+---------+-----------+---------+-----------+--------+  +-------------------------+---------+-----------+---------+-----------+--------+            EIV           RT-PatentRT-ThrombusLT-PatentLT-ThrombusComments +-------------------------+---------+-----------+---------+-----------+--------+ External Iliac Vein Prox             acute    patent                      +-------------------------+---------+-----------+---------+-----------+--------+ External Iliac Vein Mid              acute    patent                      +-------------------------+---------+-----------+---------+-----------+--------+ External Iliac Vein                  acute    patent                      Distal                                                                    +-------------------------+---------+-----------+---------+-----------+--------+  Summary: IVC/Iliac: There is no evidence of thrombus involving the IVC. There is evidence of acute thrombus involving the right common iliac vein. There is evidence of acute thrombus involving the right external iliac vein. There is no evidence of thrombus involving the left common iliac vein. There is no evidence of thrombus involving the left external iliac vein.  *See table(s) above for measurements and observations.  Electronically signed by Orlie Pollen on 01/29/2022 at 8:07:40 PM.    Final    VAS [place] LOWER EXTREMITY VENOUS (DVT) (7a-7p)  Result Date: 01/29/2022  Lower Venous DVT Study Patient Name:  ROYCE STEGMAN  Date of Exam:   01/29/2022 Medical Rec #: [id number]      Accession #:    [id number] Date of Birth: 1950-07-25      Patient Gender: M Patient Age:   11 years Exam Location:  [facility] Procedure:      VAS [place] LOWER EXTREMITY VENOUS (DVT) Referring Phys: KEN LE --------------------------------------------------------------------------------  Indications: RIGHT leg pain and swelling x2 days, history of left leg DVT,  patient endorses shortness of breath and hip pain.  Anticoagulation: Not currently on anticoagulation, previously on xarelto. Limitations: Difficulty coapting veins secondary to venous pressure and overlying edema. Comparison Study: 04-29-2018 Prior bilateral lower extremity venous study was                   positive for LEFT lower extremity DVT involving the femoral,                   popliteal, profunda, and posterior tibial veins.                    01-03-2022 Most recent prior left lower extremity venous study                   was negative for DVT. Performing Technologist: Darlin Coco RDMS, RVT  Examination Guidelines: A complete evaluation includes B-mode imaging, spectral Doppler, color Doppler, and power Doppler as needed of all accessible portions of each vessel. Bilateral testing is considered an integral part of a complete examination. Limited examinations for reoccurring indications may be performed as noted. The reflux portion of the exam is performed with the patient in reverse Trendelenburg.  +---------+---------------+---------+-----------+---------------+--------------+ RIGHT    CompressibilityPhasicitySpontaneityProperties     Thrombus Aging +---------+---------------+---------+-----------+---------------+--------------+ CFV      Partial        No       No         Unable to      Rouleaux                                                   compress due toflow--hyperacu                                             more proximal  te platelet                                                obstruction    aggregation    +---------+---------------+---------+-----------+---------------+--------------+ SFJ  Full                                                             +---------+---------------+---------+-----------+---------------+--------------+ FV Prox  Partial        No       No         Unable to                                                                  compress due to                                                           more proximal                                                             obstruction                   +---------+---------------+---------+-----------+---------------+--------------+ FV Mid   Partial        No       No         Unable to                                                                 compress due to                                                           more proximal                                                             obstruction                   +---------+---------------+---------+-----------+---------------+--------------+ FV DistalPartial        No       No         Unable to  compress due to                                                           more proximal                                                             obstruction                   +---------+---------------+---------+-----------+---------------+--------------+ PFV      Partial        No       No         Unable to                                                                 compress due to                                                           more proximal                                                             obstruction                   +---------+---------------+---------+-----------+---------------+--------------+ POP      Full           No       No                                       +---------+---------------+---------+-----------+---------------+--------------+ PTV      Full                                                             +---------+---------------+---------+-----------+---------------+--------------+ PERO     Full                                                              +---------+---------------+---------+-----------+---------------+--------------+ Gastroc  Full                                                             +---------+---------------+---------+-----------+---------------+--------------+  Unable to compress common femoral and femoral veins secondary to more proximal obstruction and overlying edema.  +----+---------------+---------+-----------+----------+--------------+ LEFTCompressibilityPhasicitySpontaneityPropertiesThrombus Aging +----+---------------+---------+-----------+----------+--------------+ CFV Full           Yes      Yes                                 +----+---------------+---------+-----------+----------+--------------+     Summary: RIGHT: - No cystic structure found in the popliteal fossa.  - Continuous flow observed throughout the right lower extremity veins suggestive of more proximal obstruction. IVC/iliac duplex examination performed in setting of right lower extremity findings.  LEFT: - No evidence of common femoral vein obstruction.  *See table(s) above for measurements and observations. Electronically signed by Orlie Pollen on 01/29/2022 at 8:07:32 PM.    Final    ECHOCARDIOGRAM COMPLETE  Result Date: 01/04/2022    ECHOCARDIOGRAM REPORT   Patient Name:   IZAEL BESSINGER Date of Exam: 01/04/2022 Medical Rec #:  [id number]     Height:       67.0 in Accession #:    [id number]    Weight:       206.8 lb Date of Birth:  March 28, 1951     BSA:          2.051 m Patient Age:    82 years      BP:           128/76 mmHg Patient Gender: M             HR:           72 bpm. Exam Location:  Inpatient Procedure: 2D Echo, Cardiac Doppler and Color Doppler Indications:    Dyspnea  History:        Patient has prior history of Echocardiogram examinations, most                 recent 11/13/2019. CAD, Signs/Symptoms:Dyspnea, Murmur and Chest                 Pain; Risk Factors:Hypertension, Diabetes, Dyslipidemia and                 Former Smoker.   Sonographer:    Wenda Low Referring Phys: [place]  1. Left ventricular ejection fraction, by estimation, is 45 to 50%. The left ventricle has mildly decreased function. The left ventricle demonstrates regional wall motion abnormalities (see scoring diagram/findings for description). There is mild concentric left ventricular hypertrophy. Left ventricular diastolic parameters are consistent with Grade I diastolic dysfunction (impaired relaxation).  2. Right ventricular systolic function is normal. The right ventricular size is normal. Tricuspid regurgitation signal is inadequate for assessing PA pressure.  3. Left atrial size was mildly dilated.  4. The mitral valve is normal in structure. Mild mitral valve regurgitation.  5. The aortic valve is tricuspid. Aortic valve regurgitation is not visualized. No aortic stenosis is present. Comparison(s): No significant change from prior study. Prior images reviewed side by side. FINDINGS  Left Ventricle: Left ventricular ejection fraction, by estimation, is 45 to 50%. The left ventricle has mildly decreased function. The left ventricle demonstrates regional wall motion abnormalities. The left ventricular internal cavity size was normal in size. There is mild concentric left ventricular hypertrophy. Left ventricular diastolic parameters are consistent with Grade I diastolic dysfunction (impaired relaxation). Normal left ventricular filling pressure.  LV Wall Scoring: The inferior wall and posterior wall are hypokinetic. The entire anterior  wall, antero-lateral wall, entire septum, and entire apex are normal. Right Ventricle: The right ventricular size is normal. No increase in right ventricular wall thickness. Right ventricular systolic function is normal. Tricuspid regurgitation signal is inadequate for assessing PA pressure. Left Atrium: Left atrial size was mildly dilated. Right Atrium: Right atrial size was normal in size. Pericardium: There is no  evidence of pericardial effusion. Mitral Valve: The mitral valve is normal in structure. Mild mitral valve regurgitation, with eccentric posteriorly directed jet. MV peak gradient, 4.1 mmHg. The mean mitral valve gradient is 2.0 mmHg. Tricuspid Valve: The tricuspid valve is normal in structure. Tricuspid valve regurgitation is not demonstrated. Aortic Valve: The aortic valve is tricuspid. Aortic valve regurgitation is not visualized. No aortic stenosis is present. Aortic valve mean gradient measures 4.0 mmHg. Aortic valve peak gradient measures 6.9 mmHg. Aortic valve area, by VTI measures 2.61 cm. Pulmonic Valve: The pulmonic valve was normal in structure. Pulmonic valve regurgitation is not visualized. Aorta: The aortic root and ascending aorta are structurally normal, with no evidence of dilitation. Venous: The inferior vena cava was not well visualized. IAS/Shunts: No atrial level shunt detected by color flow Doppler.  LEFT VENTRICLE PLAX 2D LVIDd:         4.70 cm      Diastology LVIDs:         3.50 cm      LV e' medial:    6.96 cm/s LV PW:         1.10 cm      LV E/e' medial:  12.1 LV IVS:        1.30 cm      LV e' lateral:   10.10 cm/s LVOT diam:     2.00 cm      LV E/e' lateral: 8.4 LV SV:         74 LV SV Index:   36 LVOT Area:     3.14 cm  LV Volumes (MOD) LV vol d, MOD A2C: 88.9 ml LV vol d, MOD A4C: 108.0 ml LV vol s, MOD A2C: 43.6 ml LV vol s, MOD A4C: 55.8 ml LV SV MOD A2C:     45.3 ml LV SV MOD A4C:     108.0 ml LV SV MOD BP:      49.2 ml RIGHT VENTRICLE RV Basal diam:  3.30 cm RV Mid diam:    2.90 cm RV S prime:     9.57 cm/s TAPSE (M-mode): 2.3 cm LEFT ATRIUM             Index        RIGHT ATRIUM           Index LA diam:        4.40 cm 2.15 cm/m   RA Area:     17.70 cm LA Vol (A2C):   46.9 ml 22.86 ml/m  RA Volume:   47.80 ml  23.30 ml/m LA Vol (A4C):   34.1 ml 16.62 ml/m LA Biplane Vol: 40.0 ml 19.50 ml/m  AORTIC VALVE                    PULMONIC VALVE AV Area (Vmax):    2.49 cm     PV Vmax:        1.10 m/s AV Area (Vmean):   2.24 cm     PV Peak grad:  4.8 mmHg AV Area (VTI):     2.61 cm AV Vmax:  131.00 cm/s AV Vmean:          93.700 cm/s AV VTI:            0.285 m AV Peak Grad:      6.9 mmHg AV Mean Grad:      4.0 mmHg LVOT Vmax:         104.00 cm/s LVOT Vmean:        66.800 cm/s LVOT VTI:          0.237 m LVOT/AV VTI ratio: 0.83  AORTA Ao Root diam: 3.70 cm MITRAL VALVE MV Area (PHT): 3.95 cm    SHUNTS MV Area VTI:   2.26 cm    Systemic VTI:  0.24 m MV Peak grad:  4.1 mmHg    Systemic Diam: 2.00 cm MV Mean grad:  2.0 mmHg MV Vmax:       1.01 m/s MV Vmean:      64.4 cm/s MV Decel Time: 192 msec MV E velocity: 84.40 cm/s MV A velocity: 93.80 cm/s MV E/A ratio:  0.90 Mihai Croitoru MD Electronically signed by Sanda Klein MD Signature Date/Time: 01/04/2022/3:13:08 PM    Final    VAS [place] LOWER EXTREMITY VENOUS (DVT) (ONLY [facility] & [facility])  Result Date: 01/04/2022  Lower Venous DVT Study Patient Name:  KIAN OTTAVIANO  Date of Exam:   01/03/2022 Medical Rec #: [id number]      Accession #:    [id number] Date of Birth: May 09, 1950      Patient Gender: M Patient Age:   79 years Exam Location:  [facility] Procedure:      VAS [place] LOWER EXTREMITY VENOUS (DVT) Referring Phys: ERIC CHEN --------------------------------------------------------------------------------  Indications: Pain, and Swelling.  Risk Factors: DVT LLE 04/29/2018. Limitations: Poor ultrasound/tissue interface. Comparison Study: Previous exam on 11/18/18 was negative for DVT Performing Technologist: Rogelia Rohrer RVT, RDMS  Examination Guidelines: A complete evaluation includes B-mode imaging, spectral Doppler, color Doppler, and power Doppler as needed of all accessible portions of each vessel. Bilateral testing is considered an integral part of a complete examination. Limited examinations for reoccurring indications may be performed as noted. The reflux portion of the exam is performed with the patient in reverse Trendelenburg.   +-----+---------------+---------+-----------+----------+--------------+ RIGHTCompressibilityPhasicitySpontaneityPropertiesThrombus Aging +-----+---------------+---------+-----------+----------+--------------+ CFV  Full           Yes      Yes                                 +-----+---------------+---------+-----------+----------+--------------+   +---------+---------------+---------+-----------+----------+--------------+ LEFT     CompressibilityPhasicitySpontaneityPropertiesThrombus Aging +---------+---------------+---------+-----------+----------+--------------+ CFV      Full           Yes      Yes                                 +---------+---------------+---------+-----------+----------+--------------+ SFJ      Full                                                        +---------+---------------+---------+-----------+----------+--------------+ FV Prox  Full           Yes      Yes                                 +---------+---------------+---------+-----------+----------+--------------+  FV Mid   Full           Yes      Yes                                 +---------+---------------+---------+-----------+----------+--------------+ FV DistalFull           Yes      Yes                                 +---------+---------------+---------+-----------+----------+--------------+ PFV      Full                                                        +---------+---------------+---------+-----------+----------+--------------+ POP      Full           Yes      Yes                                 +---------+---------------+---------+-----------+----------+--------------+ PTV      Full                                                        +---------+---------------+---------+-----------+----------+--------------+ PERO     Full                                                         +---------+---------------+---------+-----------+----------+--------------+    Summary: RIGHT: - No evidence of common femoral vein obstruction.  LEFT: - There is no evidence of deep vein thrombosis in the lower extremity.  - No cystic structure found in the popliteal fossa.  *See table(s) above for measurements and observations. Electronically signed by Servando Snare MD on 01/04/2022 at 10:35:52 AM.    Final    DG Chest 2 View  Result Date: 01/02/2022 CLINICAL DATA:  Shortness of breath EXAM: CHEST - 2 VIEW COMPARISON:  06/16/2021 FINDINGS: The heart size and mediastinal contours are within normal limits. Both lungs are clear. The visualized skeletal structures are unremarkable. IMPRESSION: No active cardiopulmonary disease. Electronically Signed   By: Inez Catalina M.D.   On: 01/02/2022 20:40     Subjective: - no chest pain, shortness of breath, no abdominal pain, nausea or vomiting.   Discharge Exam: BP 114/63 (BP Location: Right Arm)   Pulse 75   Temp 98.3 F (36.8 C) (Oral)   Resp (!) 23   Ht 5' 7.5" (1.715 m)   Wt 93 kg   SpO2 99%   BMI 31.63 kg/m   General: Pt is alert, awake, not in acute distress Cardiovascular: RRR, S1/S2 +, no rubs, no gallops Respiratory: CTA bilaterally, no wheezing, no rhonchi Abdominal: Soft, NT, ND, bowel sounds + Extremities: no edema, no cyanosis    The results of significant diagnostics from this hospitalization (including imaging, microbiology, ancillary and laboratory)  are listed below for reference.     Microbiology: No results found for this or any previous visit (from the past 240 hour(s)).   Labs: Basic Metabolic Panel: Recent Labs  Lab 01/29/22 1057 01/30/22 0350 01/31/22 0632  NA 137 141 138  K 4.4 3.9 3.8  CL 105 111 113*  CO2 19* 21* 21*  GLUCOSE 190* 70 60*  BUN 33* 37* 26*  CREATININE 1.47* 1.53* 1.35*  CALCIUM 9.9 9.0 8.4*  MG  --   --  1.6*  PHOS  --   --  3.0   Liver Function Tests: Recent Labs  Lab 01/31/22 0632   AST 34  ALT 25  ALKPHOS 32*  BILITOT 0.8  PROT 5.6*  ALBUMIN 2.9*   CBC: Recent Labs  Lab 01/29/22 1057 01/30/22 0350 01/31/22 0632  WBC 8.7 7.2 8.3  NEUTROABS 6.4  --   --   HGB 15.1 13.3 11.3*  HCT 43.4 38.7* 32.6*  MCV 89.5 89.2 88.6  PLT 151 128* 128*   CBG: Recent Labs  Lab 01/30/22 2108 01/31/22 0640 01/31/22 0649 01/31/22 0714 01/31/22 1128  GLUCAP 207* 52* 56* 72 211*   Hgb A1c No results for input(s): "HGBA1C" in the last 72 hours. Lipid Profile No results for input(s): "CHOL", "HDL", "LDLCALC", "TRIG", "CHOLHDL", "LDLDIRECT" in the last 72 hours. Thyroid function studies No results for input(s): "TSH", "T4TOTAL", "T3FREE", "THYROIDAB" in the last 72 hours.  Invalid input(s): "FREET3" Urinalysis    Component Value Date/Time   COLORURINE AMBER (A) 01/03/2022 0121   APPEARANCEUR CLOUDY (A) 01/03/2022 0121   LABSPEC 1.025 01/03/2022 0121   PHURINE 5.0 01/03/2022 0121   GLUCOSEU NEGATIVE 01/03/2022 0121   HGBUR LARGE (A) 01/03/2022 0121   HGBUR negative 07/29/2009 1549   BILIRUBINUR NEGATIVE 01/03/2022 0121   BILIRUBINUR Neg 10/22/2016 1713   KETONESUR NEGATIVE 01/03/2022 0121   PROTEINUR 30 (A) 01/03/2022 0121   UROBILINOGEN 0.2 10/22/2016 1713   UROBILINOGEN 0.2 12/10/2013 1103   NITRITE NEGATIVE 01/03/2022 0121   LEUKOCYTESUR NEGATIVE 01/03/2022 0121    FURTHER DISCHARGE INSTRUCTIONS:   Get Medicines reviewed and adjusted: Please take all your medications with you for your next visit with your Primary MD   Laboratory/radiological data: Please request your Primary MD to go over all hospital tests and procedure/radiological results at the follow up, please ask your Primary MD to get all Hospital records sent to his/her office.   In some cases, they will be blood work, cultures and biopsy results pending at the time of your discharge. Please request that your primary care M.D. goes through all the records of your hospital data and follows up on  these results.   Also Note the following: If you experience worsening of your admission symptoms, develop shortness of breath, life threatening emergency, suicidal or homicidal thoughts you must seek medical attention immediately by calling 911 or calling your MD immediately  if symptoms less severe.   You must read complete instructions/literature along with all the possible adverse reactions/side effects for all the Medicines you take and that have been prescribed to you. Take any new Medicines after you have completely understood and accpet all the possible adverse reactions/side effects.    Do not drive when taking Pain medications or sleeping medications (Benzodaizepines)   Do not take more than prescribed Pain, Sleep and Anxiety Medications. It is not advisable to combine anxiety,sleep and pain medications without talking with your primary care practitioner   Special Instructions: If you have smoked  or chewed Tobacco  in the last 2 yrs please stop smoking, stop any regular Alcohol  and or any Recreational drug use.   Wear Seat belts while driving.   Please note: You were cared for by a hospitalist during your hospital stay. Once you are discharged, your primary care physician will handle any further medical issues. Please note that NO REFILLS for any discharge medications will be authorized once you are discharged, as it is imperative that you return to your primary care physician (or establish a relationship with a primary care physician if you do not have one) for your post hospital discharge needs so that they can reassess your need for medications and monitor your lab values.  Time coordinating discharge: 40 minutes  SIGNED:  Marzetta Board, MD, PhD 01/31/2022, 2:09 PM

## 2022-01-31 NOTE — Discharge Instructions (Signed)
° °  [facility] ° °Discharge Instructions ° °Lower Extremity Angiogram; Angioplasty/Stenting ° °Please refer to the following instructions for your post-procedure care. Your surgeon or physician assistant will discuss any changes with you. ° °Activity ° °Avoid lifting more than 8 pounds (1 gallons of milk) for 72 hours (3 days) after your procedure. You may walk as much as you can tolerate. It's OK to drive after 72 hours. ° °Bathing/Showering ° °You may shower the day after your procedure. If you have a bandage, you may remove it at 24- 48 hours. Clean your incision site with mild soap and water. Pat the area dry with a clean towel. ° °Diet ° °Resume your pre-procedure diet. There are no special food restrictions following this procedure. All patients with peripheral vascular disease should follow a low fat/low cholesterol diet. In order to heal from your surgery, it is CRITICAL to get adequate nutrition. Your body requires vitamins, minerals, and protein. Vegetables are the best source of vitamins and minerals. Vegetables also provide the perfect balance of protein. Processed food has little nutritional value, so try to avoid this. ° °Medications ° °Resume taking all of your medications unless your doctor tells you not to. If your incision is causing pain, you may take over-the-counter pain relievers such as acetaminophen (Tylenol) ° °Follow Up ° °Follow up will be arranged at the time of your procedure. You may have an office visit scheduled or may be scheduled for surgery. Ask your surgeon if you have any questions. ° °Please call us immediately for any of the following conditions: °•Severe or worsening pain your legs or feet at rest or with walking. °•Increased pain, redness, drainage at your groin puncture site. °•Fever of 101 degrees or higher. °•If you have any mild or slow bleeding from your puncture site: lie down, apply firm constant pressure over the area with a piece of  gauze or a clean wash cloth for 30 minutes- no peeking!, call 911 right away if you are still bleeding after 30 minutes, or if the bleeding is heavy and unmanageable. ° °Reduce your risk factors of vascular disease: ° °Stop smoking. If you would like help call [facility] at 1-800-QUIT-NOW (1-800-784-8669) or [facility] at [phone]. °Manage your cholesterol °Maintain a desired weight °Control your diabetes °Keep your blood pressure down ° °If you have any questions, please call the office at [phone] ° °

## 2022-01-31 NOTE — Progress Notes (Addendum)
[place] for heparin>>rivaroxaban  Indication: DVT  No Known Allergies  Patient Measurements: Height: 5' 7.5" (171.5 cm) Weight: 93 kg (205 lb) IBW/kg (Calculated) : 67.25 Heparin Dosing Weight: 86.7kg  Vital Signs: Temp: 97.8 F (36.6 C) (10/14 0732) Temp Source: Oral (10/14 0732) BP: 128/74 (10/14 0732) Pulse Rate: 78 (10/14 0732)  Labs: Recent Labs    01/29/22 1057 01/30/22 0350 01/30/22 2017 01/31/22 0632  HGB 15.1 13.3  --  11.3*  HCT 43.4 38.7*  --  32.6*  PLT 151 128*  --  128*  HEPARINUNFRC  --  0.80* 1.06* 0.87*  CREATININE 1.47* 1.53*  --  1.35*     Estimated Creatinine Clearance: 55.9 mL/min (A) (by C-G formula based on SCr of 1.35 mg/dL (H)).   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   by [place]   CAD (coronary artery disease) 2003   cath - Min Dz, EF 65%, Adm.R/O'd   Cervical spondylosis without myelopathy    Chest pain, atypical    Choroidal nevus 01/22/2011   left, yearly eye exam, no diabetic retinopathy   Diabetes mellitus type II    DVT of deep femoral vein, left ([place]) 04/29/2018   LLE DVT from proximal femoral vein to popliteal vein into proximal calf (04/29/2018) started on xarelto Heme ([place]) recommended continuing lifelong lower dose anticoagulant indefinitely (11/2018)   Dyspepsia    Ex-smoker    Fatty liver 02/2016   by [place]   GERD (gastroesophageal reflux disease)    Headache(784.0)    Heart murmur    Hx of   History of MRI of brain and brain stem 09/06   History of MRSA infection 12/2013   R knee boil   HLD (hyperlipidemia)    Hyperuricemia    Obesity    Sleep apnea    Stress-induced cardiomyopathy 09/27/98   WNL, EF 64%   Urosepsis 01/01-01/05/10    Hospitalization    Assessment: 63 YOM presenting with swelling in leg with pain, DVT on doppler. He had a DVT in 04/2018 and initiated rivaroxaban therapy through 11/2019 due to Eliquis not being covered by insurance. He has not been on anticoagulation since rivaroxaban was discontinued in 11/2019. Originally started on heparin and pharmacy has been consulted to transition to rivaroxaban.   Goal of Therapy:  Monitor platelets by anticoagulation protocol: Yes   Plan:  Discontinue heparin gtt  Start rivaroxaban '15mg'$  BID given with meals  Monitor CBC, s/sx of bleeding   Billey Gosling, PharmD PGY1 Pharmacy Resident 10/14/20239:23 AM

## 2022-01-31 NOTE — Progress Notes (Signed)
Hypoglycemic Event  CBG: 52  Treatment: 8 oz juice/soda  Symptoms: None  Follow-up CBG: [id number]:[id number] CBG Result:72  Possible Reasons for Event: medication: patient received the night prior 100 units insulin - Semglee  Comments/MD notified:no    Alan Morrison

## 2022-02-02 ENCOUNTER — Encounter (HOSPITAL_COMMUNITY): Payer: Self-pay | Admitting: Vascular Surgery

## 2022-02-02 ENCOUNTER — Encounter: Payer: Self-pay | Admitting: Family Medicine

## 2022-02-02 LAB — SURGICAL PATHOLOGY

## 2022-02-02 NOTE — Telephone Encounter (Signed)
Plz schedule hospital f/u office visit tomorrow at 4:30pm.  In interim recommend leg elevation and wrapping blister loosely with gauze wrap until seen.  Is this at site of thrombectomy or next to it?

## 2022-02-02 NOTE — Telephone Encounter (Signed)
Patient wife called back in and would like to know could she get advice on this issue today,so she can be treating it as soon as possible.

## 2022-02-03 ENCOUNTER — Encounter: Payer: Self-pay | Admitting: Family Medicine

## 2022-02-03 ENCOUNTER — Telehealth: Payer: Self-pay | Admitting: Physician Assistant

## 2022-02-03 ENCOUNTER — Ambulatory Visit (INDEPENDENT_AMBULATORY_CARE_PROVIDER_SITE_OTHER): Payer: Medicare HMO | Admitting: Family Medicine

## 2022-02-03 VITALS — BP 136/82 | HR 88 | Temp 97.6°F | Ht 67.5 in | Wt 211.4 lb

## 2022-02-03 DIAGNOSIS — S80821A Blister (nonthermal), right lower leg, initial encounter: Secondary | ICD-10-CM | POA: Diagnosis not present

## 2022-02-03 DIAGNOSIS — D3502 Benign neoplasm of left adrenal gland: Secondary | ICD-10-CM | POA: Diagnosis not present

## 2022-02-03 DIAGNOSIS — N1831 Chronic kidney disease, stage 3a: Secondary | ICD-10-CM

## 2022-02-03 DIAGNOSIS — T148XXA Other injury of unspecified body region, initial encounter: Secondary | ICD-10-CM

## 2022-02-03 DIAGNOSIS — I2699 Other pulmonary embolism without acute cor pulmonale: Secondary | ICD-10-CM | POA: Diagnosis not present

## 2022-02-03 DIAGNOSIS — R911 Solitary pulmonary nodule: Secondary | ICD-10-CM

## 2022-02-03 DIAGNOSIS — I82421 Acute embolism and thrombosis of right iliac vein: Secondary | ICD-10-CM

## 2022-02-03 NOTE — Patient Instructions (Signed)
Continue daily dressing change. Let us know if any streaking redness or draining pus.  Keep upcoming appointments.  Labs today

## 2022-02-03 NOTE — Telephone Encounter (Signed)
-----   Message from Dagoberto Ligas, PA-C sent at 01/31/2022  9:09 AM EDT -----  4-6 weeks with IVC/iliac and R leg venous duplex with PA on [place] day.  PO R iliofemoral DVT thrombectomy Thanks

## 2022-02-03 NOTE — Telephone Encounter (Signed)
error 

## 2022-02-03 NOTE — Progress Notes (Unsigned)
Patient ID: Alan Morrison, male    DOB: 07-May-1950, 71 y.o.   MRN: [id number]  This visit was conducted in person.  BP 136/82   Pulse 88   Temp 97.6 F (36.4 C) (Temporal)   Ht 5' 7.5" (1.715 m)   Wt 211 lb 6 oz (95.9 kg)   SpO2 96%   BMI 32.62 kg/m    CC: hosp f/u visit  Subjective:   HPI: Alan Morrison is a 71 y.o. male presenting on 02/03/2022 for Hospitalization Follow-up (Admitted on 01/29/22 at [facility], dx acute DVT of iliac vein of R LE.  C/o large blister on R LE that has drained.  Pt accompanied by wife, Alan Morrison. )   Recent hospitalization for R leg swelling, found to have acute occlusive DVT of external iliac, common femoral, superficial femoral, deep femoral veins s/p percutaneous mechanical venous thrombectomy of R popliteal, superficial femoral, common femoral, iliac vein and distal IVC with angioplasty of R iliac vein and distal IVC (01/30/2022). Started on xarelto '20mg'$  daily. He was also found to have acute PE with moderate clot burden, largely asymptomatic. Echocardiogram showed EF 50-55% with G1DD, RV moderately enlarged with normal systolic function.  Hospital records reviewed. Med rec performed.   Other incidental findings: 1.4 cm indeterminate left adrenal nodule-outpatient follow-up, repeat CT scan in 1 year. 5.3 mm right lower lobe pulmonary nodule-outpatient follow-up  Since home, has had several blisters develop to posterior leg. Several are healing well, one larger one behind knee continues draining. No fever, no streaking redness, no nausea.   Home health not set up.  Other follow up appointments scheduled: cardiology 02/11/2022, endocrinology 03/17/2022, vascular surgery appt pending.  ______________________________________________________________________ Hospital admission: 01/29/2022 Hospital discharge: 01/31/2022 TCM f/u phone call: not performed   Recommendations for Outpatient Follow-up:  Follow up with PCP in 1-2 weeks Please obtain BMP/CBC in  one week   Home Health: none Equipment/Devices: none Discharge Condition: stable CODE STATUS: Full code  Hospital Course / Discharge diagnoses: Principal Problem:   Acute deep vein thrombosis (DVT) of iliac vein of right lower extremity (HCC) Active Problems:   Type 2 diabetes mellitus with chronic kidney disease, with long-term current use of insulin (HCC)   Coronary artery disease   Dyslipidemia   Essential hypertension   BPH (benign prostatic hyperplasia)   Chronic kidney disease, stage 3a ([place])     Relevant past medical, surgical, family and social history reviewed and updated as indicated. Interim medical history since our last visit reviewed. Allergies and medications reviewed and updated. Outpatient Medications Prior to Visit  Medication Sig Dispense Refill   acetaminophen (TYLENOL) 500 MG tablet Take 1,000 mg by mouth 2 (two) times daily as needed for headache (pain).     aspirin EC 81 MG tablet Take 81 mg by mouth daily with supper. Swallow whole.     Calcium Carbonate Antacid (TUMS PO) Take 1 tablet by mouth daily as needed (acid reflux/indigestion).     Cholecalciferol (VITAMIN D) 2000 units tablet Take 2,000 Units by mouth daily with breakfast.      finasteride (PROSCAR) 5 MG tablet TAKE 1 TABLET BY MOUTH EVERY DAY (Patient taking differently: Take 5 mg by mouth daily.) 90 tablet 3   Insulin Glargine (BASAGLAR KWIKPEN) 100 UNIT/ML Inject 100 Units into the skin at bedtime.     Insulin Pen Needle (B-D ULTRAFINE III SHORT PEN) 31G X 8 MM MISC 1 Device by Other route 4 (four) times daily. USE AS DIRECTED DAILY 120 each 11  Iron, Ferrous Sulfate, 325 (65 Fe) MG TABS Take 325 mg by mouth every other day.     losartan (COZAAR) 25 MG tablet Take 1 tablet (25 mg total) by mouth daily. 90 tablet 1   metFORMIN (GLUCOPHAGE) 1000 MG tablet Take 1 tablet (1,000 mg total) by mouth daily with breakfast. TAKE 1 TABLET BY MOUTH EVERY DAY AT BREAKFAST Strength: 1,000 mg (Patient taking  differently: Take 2,000 mg by mouth 2 (two) times daily with a meal.) 90 tablet 3   metoprolol tartrate (LOPRESSOR) 25 MG tablet TAKE 0.5 TABLETS BY MOUTH 2 TIMES DAILY. (Patient taking differently: Take 12.5 mg by mouth 2 (two) times daily.) 90 tablet 2   Multiple Vitamin (MULTIVITAMIN WITH MINERALS) TABS tablet Take 1 tablet by mouth daily with breakfast.      nitroGLYCERIN (NITROSTAT) 0.4 MG SL tablet PLACE 1 TABLET UNDER THE TONGUE EVERY 5 MINUTES AS NEEDED FOR CHEST PAIN (Patient taking differently: Place 0.4 mg under the tongue every 5 (five) minutes as needed for chest pain.) 25 tablet 11   ONETOUCH ULTRA test strip USE TO CHECK SUGARS DAILY  AS DIRECTED 100 strip 3   probenecid (BENEMID) 500 MG tablet Take 0.5 tablets (250 mg total) by mouth every other day. 45 tablet 3   RIVAROXABAN (XARELTO) VTE STARTER PACK (15 & 20 MG) Follow package directions: Take one '15mg'$  tablet by mouth twice a day. On day 22, switch to one '20mg'$  tablet once a day. Take with food. 51 each 0   rosuvastatin (CRESTOR) 40 MG tablet Take 1 tablet (40 mg total) by mouth daily with supper. 90 tablet 2   tamsulosin (FLOMAX) 0.4 MG CAPS capsule Take 1 capsule (0.4 mg total) by mouth daily. 90 capsule 3   TRULICITY 9.38 BO/1.7PZ SOPN Inject 0.75 mg into the skin once a week.     No facility-administered medications prior to visit.     Per HPI unless specifically indicated in ROS section below Review of Systems  Objective:  BP 136/82   Pulse 88   Temp 97.6 F (36.4 C) (Temporal)   Ht 5' 7.5" (1.715 m)   Wt 211 lb 6 oz (95.9 kg)   SpO2 96%   BMI 32.62 kg/m   Wt Readings from Last 3 Encounters:  02/03/22 211 lb 6 oz (95.9 kg)  01/29/22 205 lb (93 kg)  01/14/22 207 lb (93.9 kg)      Physical Exam Vitals and nursing note reviewed.  Constitutional:      Appearance: Normal appearance. He is not ill-appearing.  Cardiovascular:     Rate and Rhythm: Normal rate and regular rhythm.     Pulses: Normal pulses.      Heart sounds: Normal heart sounds. No murmur heard. Pulmonary:     Effort: Pulmonary effort is normal. No respiratory distress.     Breath sounds: Normal breath sounds. No wheezing, rhonchi or rales.  Musculoskeletal:     Right lower leg: Edema present.     Left lower leg: No edema.  Skin:    General: Skin is warm and dry.     Findings: Bruising and lesion present. No rash.     Comments:  Large deflated blister to right medial popliteal region, with much smaller lesions lateral popliteal area Prior intervention site without erythema or drainage  Neurological:     Mental Status: He is alert.  Psychiatric:        Mood and Affect: Mood normal.        Behavior:  Behavior normal.        Results for orders placed or performed in visit on 02/03/22  CBC with Differential/Platelet  Result Value Ref Range   WBC 7.8 4.0 - 10.5 K/uL   RBC 4.10 (L) 4.22 - 5.81 Mil/uL   Hemoglobin 12.6 (L) 13.0 - 17.0 g/dL   HCT 36.9 (L) 39.0 - 52.0 %   MCV 89.9 78.0 - 100.0 fl   MCHC 34.3 30.0 - 36.0 g/dL   RDW 13.9 11.5 - 15.5 %   Platelets 217.0 150.0 - 400.0 K/uL   Neutrophils Relative % 71.1 43.0 - 77.0 %   Lymphocytes Relative 18.2 12.0 - 46.0 %   Monocytes Relative 6.1 3.0 - 12.0 %   Eosinophils Relative 3.7 0.0 - 5.0 %   Basophils Relative 0.9 0.0 - 3.0 %   Neutro Abs 5.6 1.4 - 7.7 K/uL   Lymphs Abs 1.4 0.7 - 4.0 K/uL   Monocytes Absolute 0.5 0.1 - 1.0 K/uL   Eosinophils Absolute 0.3 0.0 - 0.7 K/uL   Basophils Absolute 0.1 0.0 - 0.1 K/uL  Comprehensive metabolic panel  Result Value Ref Range   Sodium 136 135 - 145 mEq/L   Potassium 5.2 Hemolysis seen.. (H) 3.5 - 5.1 mEq/L   Chloride 103 96 - 112 mEq/L   CO2 26 19 - 32 mEq/L   Glucose, Bld 116 (H) 70 - 99 mg/dL   BUN 23 6 - 23 mg/dL   Creatinine, Ser 1.34 0.40 - 1.50 mg/dL   Total Bilirubin 0.7 0.2 - 1.2 mg/dL   Alkaline Phosphatase 42 39 - 117 U/L   AST 27 0 - 37 U/L   ALT 27 0 - 53 U/L   Total Protein 6.7 6.0 - 8.3 g/dL   Albumin  4.0 3.5 - 5.2 g/dL   GFR 53.62 (L) >60.00 mL/min   Calcium 9.9 8.4 - 10.5 mg/dL    Assessment & Plan:   Problem List Items Addressed This Visit     Acute deep vein thrombosis (DVT) of iliac vein of right lower extremity (HCC)    Recent dx extensive RLE DVT involving IVC to popliteal vein as well as bilateral PEs.  S/p percutaneous mechanical thrombectomy.  Now on xarelto.  Has planned follow up with [facility].       Relevant Orders   CBC with Differential/Platelet (Completed)   Comprehensive metabolic panel (Completed)   Chronic kidney disease, stage 3a ([place])    Update labs today.       Bilateral pulmonary embolism (HCC)    Now on xarelto '20mg'$  daily.       Blister of skin - Primary    Discussed wound care, now that blister has drained, do recommend keep epithelium intact as able.  Recommend abx ointment followed by nonstick gauze, hopefully residual skin will act as protective barrier to speed wound healing.       Adrenal adenoma, left    1.4cm indeterminate L adrenal adenoma on CT 01/2022 - rec repeat CT in 1 year.       Right lower lobe pulmonary nodule    Incidentally found.  Discussed with patient. Non smoker.  Consider rpt imaging in 3-6 months.         No orders of the defined types were placed in this encounter.  Orders Placed This Encounter  Procedures   CBC with Differential/Platelet   Comprehensive metabolic panel     Patient Instructions  Continue daily dressing change. Let us know if any streaking redness  or draining pus.  Keep upcoming appointments.  Labs today   Follow up plan: Return if symptoms worsen or fail to improve.  [name], MD

## 2022-02-04 LAB — CBC WITH DIFFERENTIAL/PLATELET
Basophils Absolute: 0.1 10*3/uL (ref 0.0–0.1)
Basophils Relative: 0.9 % (ref 0.0–3.0)
Eosinophils Absolute: 0.3 10*3/uL (ref 0.0–0.7)
Eosinophils Relative: 3.7 % (ref 0.0–5.0)
HCT: 36.9 % — ABNORMAL LOW (ref 39.0–52.0)
Hemoglobin: 12.6 g/dL — ABNORMAL LOW (ref 13.0–17.0)
Lymphocytes Relative: 18.2 % (ref 12.0–46.0)
Lymphs Abs: 1.4 10*3/uL (ref 0.7–4.0)
MCHC: 34.3 g/dL (ref 30.0–36.0)
MCV: 89.9 fl (ref 78.0–100.0)
Monocytes Absolute: 0.5 10*3/uL (ref 0.1–1.0)
Monocytes Relative: 6.1 % (ref 3.0–12.0)
Neutro Abs: 5.6 10*3/uL (ref 1.4–7.7)
Neutrophils Relative %: 71.1 % (ref 43.0–77.0)
Platelets: 217 10*3/uL (ref 150.0–400.0)
RBC: 4.1 Mil/uL — ABNORMAL LOW (ref 4.22–5.81)
RDW: 13.9 % (ref 11.5–15.5)
WBC: 7.8 10*3/uL (ref 4.0–10.5)

## 2022-02-04 LAB — COMPREHENSIVE METABOLIC PANEL
ALT: 27 U/L (ref 0–53)
AST: 27 U/L (ref 0–37)
Albumin: 4 g/dL (ref 3.5–5.2)
Alkaline Phosphatase: 42 U/L (ref 39–117)
BUN: 23 mg/dL (ref 6–23)
CO2: 26 mEq/L (ref 19–32)
Calcium: 9.9 mg/dL (ref 8.4–10.5)
Chloride: 103 mEq/L (ref 96–112)
Creatinine, Ser: 1.34 mg/dL (ref 0.40–1.50)
GFR: 53.62 mL/min — ABNORMAL LOW (ref >60.00)
Glucose, Bld: 116 mg/dL — ABNORMAL HIGH (ref 70–99)
Potassium: 5.2 mEq/L — ABNORMAL HIGH (ref 3.5–5.1)
Sodium: 136 mEq/L (ref 135–145)
Total Bilirubin: 0.7 mg/dL (ref 0.2–1.2)
Total Protein: 6.7 g/dL (ref 6.0–8.3)

## 2022-02-05 DIAGNOSIS — D3502 Benign neoplasm of left adrenal gland: Secondary | ICD-10-CM | POA: Insufficient documentation

## 2022-02-05 DIAGNOSIS — I2699 Other pulmonary embolism without acute cor pulmonale: Secondary | ICD-10-CM

## 2022-02-05 DIAGNOSIS — T148XXA Other injury of unspecified body region, initial encounter: Secondary | ICD-10-CM | POA: Insufficient documentation

## 2022-02-05 DIAGNOSIS — R911 Solitary pulmonary nodule: Secondary | ICD-10-CM | POA: Insufficient documentation

## 2022-02-05 HISTORY — DX: Other pulmonary embolism without acute cor pulmonale: I26.99

## 2022-02-05 NOTE — Assessment & Plan Note (Signed)
1.4cm indeterminate L adrenal adenoma on CT 01/2022 - rec repeat CT in 1 year.

## 2022-02-05 NOTE — Assessment & Plan Note (Signed)
Discussed wound care, now that blister has drained, do recommend keep epithelium intact as able.  Recommend abx ointment followed by nonstick gauze, hopefully residual skin will act as protective barrier to speed wound healing.

## 2022-02-05 NOTE — Assessment & Plan Note (Addendum)
Incidentally found.  Discussed with patient. Non smoker.  Consider rpt imaging in 3-6 months.

## 2022-02-05 NOTE — Assessment & Plan Note (Signed)
Update labs today 

## 2022-02-05 NOTE — Assessment & Plan Note (Signed)
Now on xarelto '20mg'$  daily.

## 2022-02-05 NOTE — Assessment & Plan Note (Signed)
Recent dx extensive RLE DVT involving IVC to popliteal vein as well as bilateral PEs.  S/p percutaneous mechanical thrombectomy.  Now on xarelto.  Has planned follow up with [facility].

## 2022-02-08 NOTE — Progress Notes (Unsigned)
Cardiology Office Note:    Date:  02/11/2022   ID:  Alan Morrison, DOB 19-Jul-1950, MRN [id number]  PCP:  [name], MD  [facility] Cardiologist:  Akeyla Molden Martinique, MD  [facility] Electrophysiologist:  None   Referring MD: [name], MD   Chief Complaint  Patient presents with   Follow-up   pulmonary embolus   dvt    History of Present Illness:    Alan Morrison is a 71 y.o. male with a hx of HLD, DM II, stress cardiomyopathy, OSA on CPAP, history of DVT on Xarelto, former smoker, and CAD.  Patient had nonobstructive CAD by angiography in 2018.  More recently, he presented with inferior wall acute STEMI on 11/12/2019.  Initial EKG did identify inferior Q waves with persistent ST elevation in the inferior lead.  Cardiac catheterization performed on 11/12/2019 showed total occlusion of proximal to mid RCA treated with 3.0 x 22 mm Onyx DES postdilated to 3.5 mm, 30% mid PDA vessel lesion, widely patent left main, 50% proximal to mid LAD lesion, EF 55%, LVEDP 16 mmHg.  Postprocedure, patient was placed on triple therapy including aspirin, Plavix and Xarelto.  The plan is to discontinue aspirin after 1 month.  Echocardiogram showed EF 45 to 50%.  During the hospitalization patient also had frequent hypoglycemic spells at home, he was not very compliant with insulin at home which explains the hypoglycemic episodes when he was placed on the full-strength insulin.  Lovenox was stopped and he was discharged on long-acting insulin only with plan to be evaluated by his endocrinologist for further medication adjustment.  He was seen by Dr. Jana Hakim on 11/20/2019 who discontinued the Xarelto. He did recommend repeating a D dimer at some point.   He was admitted in September after presenting to the ER with a 3-day history of left calf pain with no trauma history. He has also had a 4-week history of worsening dyspnea on exertion.  Upon arrival, patient was hemodynamically stable. BNP normal at  16.8,D-dimer is elevated at 1.35, Sodium 132, potassium 6.3, BUN of 51, creatinine 2.4.  Due to elevated creatinine, CTA was not obtained.  Subsequently lower extremity Doppler was negative for DVT.  Patient was never hypoxic.  Clinical suspicion for PE was very low.  Anticoagulation was discontinued.  Patient continued to improve despite of discontinuation anticoagulation.  Patient echo was repeated which once again showed ejection fraction of 45 to 50% with left ventricular regional wall abnormalities, essentially no change compared to the echo done 2 years ago.  He was then admitted again in October with worsening swelling and pain in RLE. LE dopplers this time showed occlusive thrombus in the external iliac, common femoral, superficial femoral, and deep femoral veins on the right -vascular surgery consulted, patient was taken to the OR on 10/13 and he is status post percutaneous mechanical venous thrombectomy of the right popliteal, superficial femoral, common femoral, iliac vein and distal IVC; Percutaneous mechanical venous thrombectomy of the right popliteal, superficial femoral, common femoral, iliac vein and distal IVC; Angioplasty of the right iliac vein and distal IVC (10 mm x 60 mm [place]ang).  He recovered well postoperatively, leg swelling has improved significantly, he is able to ambulate in the hallway without significant difficulties.  He was started on Xarelto. He did have Acute PE with  moderate burden, clinically without chest pain, tachycardia, significant hypotension.  2D echo shows an EF of 36-62%, grade 1 diastolic dysfunction, RV moderately enlarged with normal systolic function.  He is  normotensive, satting well on room air.  On follow up today he notes RLE swelling has almost resolved. He still has some pain. Using compression hose. Notes some SOB at times. No chest pain or palpitations. No bleeding issues.   Past Medical History:  Diagnosis Date   Acute ST elevation myocardial  infarction (STEMI) of inferior wall (HCC) 11/12/2019   Mid RCA 100%--> 22 x 3.0 Onyx to 3.5 mm   Anemia, iron deficiency, inadequate dietary intake    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   by [place]   CAD (coronary artery disease) 2003   cath - Min Dz, EF 65%, Adm.R/O'd   Cervical spondylosis without myelopathy    Chest pain, atypical    Choroidal nevus 01/22/2011   left, yearly eye exam, no diabetic retinopathy   Diabetes mellitus type II    DVT of deep femoral vein, left ([place]) 04/29/2018   LLE DVT from proximal femoral vein to popliteal vein into proximal calf (04/29/2018) started on xarelto Heme ([place]) recommended continuing lifelong lower dose anticoagulant indefinitely (11/2018)   Dyspepsia    Ex-smoker    Fatty liver 02/2016   by [place]   GERD (gastroesophageal reflux disease)    Headache(784.0)    Heart murmur    Hx of   History of MRI of brain and brain stem 09/06   History of MRSA infection 12/2013   R knee boil   HLD (hyperlipidemia)    Hyperuricemia    Obesity    Sleep apnea    Stress-induced cardiomyopathy 09/27/98   WNL, EF 64%   Urosepsis 01/01-01/05/10   Hospitalization    Past Surgical History:  Procedure Laterality Date   CARDIAC CATHETERIZATION     COLONOSCOPY  07/2013   1 benign polyp rpt 10 yrs [name])   CORONARY THROMBECTOMY N/A 11/12/2019   Procedure: Coronary Thrombectomy;  Surgeon: Belva Crome, MD;  Location: [place] [facility];  Service: Cardiovascular;  Laterality: N/A;   CORONARY/GRAFT ACUTE MI REVASCULARIZATION N/A 11/12/2019   Procedure: Coronary/Graft Acute MI Revascularization;  Surgeon: Belva Crome, MD;  Location: [place] [facility];  Service: Cardiovascular;  Laterality: N/A;   INTRAVASCULAR ULTRASOUND/IVUS N/A 01/30/2022   Procedure: Intravascular Ultrasound/IVUS;  Surgeon: Marty Heck, MD;  Location: [place] [facility];  Service: Cardiovascular;  Laterality: N/A;  Venous   KNEE ARTHROSCOPY Left 1988   KNEE SURGERY Right 05/21/03    Right, Dr. Marlou Sa, med meniscus tear via MRI   LEFT HEART CATH AND CORONARY ANGIOGRAPHY N/A 04/01/2017   Procedure: LEFT HEART CATH AND CORONARY ANGIOGRAPHY;  Surgeon: Martinique, Avonne Berkery M, MD;  Location: [place] [facility];  Service: Cardiovascular;  Laterality: N/A;   LEFT HEART CATH AND CORONARY ANGIOGRAPHY N/A 11/12/2019   Procedure: LEFT HEART CATH AND CORONARY ANGIOGRAPHY;  Surgeon: Belva Crome, MD;  Location: [place] [facility];  Service: Cardiovascular;  Laterality: N/A;   MYELOGRAM  08/05   bulging disc   PERIPHERAL VASCULAR BALLOON ANGIOPLASTY  01/30/2022   Procedure: PERIPHERAL VASCULAR BALLOON ANGIOPLASTY;  Surgeon: Marty Heck, MD;  Location: [place] [facility];  Service: Cardiovascular;;   PERIPHERAL VASCULAR THROMBECTOMY Right 01/30/2022   Procedure: PERIPHERAL VASCULAR THROMBECTOMY;  Surgeon: Marty Heck, MD;  Location: [place] [facility];  Service: Cardiovascular;  Laterality: Right;    Current Medications: Current Meds  Medication Sig   acetaminophen (TYLENOL) 500 MG tablet Take 1,000 mg by mouth 2 (two) times daily as needed for headache (pain).   aspirin  EC 81 MG tablet Take 81 mg by mouth daily with supper. Swallow whole.   Calcium Carbonate Antacid (TUMS PO) Take 1 tablet by mouth daily as needed (acid reflux/indigestion).   Cholecalciferol (VITAMIN D) 2000 units tablet Take 2,000 Units by mouth daily with breakfast.    finasteride (PROSCAR) 5 MG tablet TAKE 1 TABLET BY MOUTH EVERY DAY (Patient taking differently: Take 5 mg by mouth daily.)   Insulin Glargine (BASAGLAR KWIKPEN) 100 UNIT/ML Inject 100 Units into the skin at bedtime.   Insulin Pen Needle (B-D ULTRAFINE III SHORT PEN) 31G X 8 MM MISC 1 Device by Other route 4 (four) times daily. USE AS DIRECTED DAILY   Iron, Ferrous Sulfate, 325 (65 Fe) MG TABS Take 325 mg by mouth every other day.   losartan (COZAAR) 25 MG tablet Take 1 tablet (25 mg total) by mouth daily.   metFORMIN (GLUCOPHAGE) 1000 MG  tablet Take 1 tablet (1,000 mg total) by mouth daily with breakfast. TAKE 1 TABLET BY MOUTH EVERY DAY AT BREAKFAST Strength: 1,000 mg (Patient taking differently: Take 2,000 mg by mouth 2 (two) times daily with a meal.)   metoprolol tartrate (LOPRESSOR) 25 MG tablet TAKE 0.5 TABLETS BY MOUTH 2 TIMES DAILY. (Patient taking differently: Take 12.5 mg by mouth 2 (two) times daily.)   Multiple Vitamin (MULTIVITAMIN WITH MINERALS) TABS tablet Take 1 tablet by mouth daily with breakfast.    nitroGLYCERIN (NITROSTAT) 0.4 MG SL tablet PLACE 1 TABLET UNDER THE TONGUE EVERY 5 MINUTES AS NEEDED FOR CHEST PAIN (Patient taking differently: Place 0.4 mg under the tongue every 5 (five) minutes as needed for chest pain.)   ONETOUCH ULTRA test strip USE TO CHECK SUGARS DAILY  AS DIRECTED   probenecid (BENEMID) 500 MG tablet Take 0.5 tablets (250 mg total) by mouth every other day.   RIVAROXABAN (XARELTO) VTE STARTER PACK (15 & 20 MG) Follow package directions: Take one '15mg'$  tablet by mouth twice a day. On day 22, switch to one '20mg'$  tablet once a day. Take with food.   rosuvastatin (CRESTOR) 40 MG tablet Take 1 tablet (40 mg total) by mouth daily with supper.   tamsulosin (FLOMAX) 0.4 MG CAPS capsule Take 1 capsule (0.4 mg total) by mouth daily.   TRULICITY 7.25 DG/6.4QI SOPN Inject 0.75 mg into the skin once a week.     Allergies:   Patient has no known allergies.   Social History   Socioeconomic History   Marital status: Married    Spouse name: Not on file   Number of children: 2   Years of education: 12   Highest education level: High school graduate  Occupational History   Occupation: Maintenance    Employer: [employer]  Tobacco Use   Smoking status: Former    Types: Cigarettes    Quit date: 04/20/1974    Years since quitting: 47.8   Smokeless tobacco: Never   Tobacco comments:    quit over 20 years  Vaping Use   Vaping Use: Never used  Substance and Sexual Activity   Alcohol use: Yes     Alcohol/week: 3.0 standard drinks of alcohol    Types: 3 Cans of beer per week    Comment: occasional   Drug use: No   Sexual activity: Not on file  Other Topics Concern   Not on file  Social History Narrative   Lives with wife   Occupation: equipment maintenance   Activity: no regular exercise - hunts   Diet:  good water, fruits/vegetables   Social Determinants of Health   Financial Resource Strain: Low Risk  (01/05/2022)   Overall Financial Resource Strain (CARDIA)    Difficulty of Paying Living Expenses: Not hard at all  Food Insecurity: No Food Insecurity (01/30/2022)   Hunger Vital Sign    Worried About Running Out of Food in the Last Year: Never true    Ran Out of Food in the Last Year: Never true  Transportation Needs: No Transportation Needs (01/30/2022)   PRAPARE - Hydrologist (Medical): No    Lack of Transportation (Non-Medical): No  Physical Activity: Inactive (06/05/2021)   Exercise Vital Sign    Days of Exercise per Week: 0 days    Minutes of Exercise per Session: 0 min  Stress: No Stress Concern Present (06/05/2021)   [place]    Feeling of Stress : Not at all  Social Connections: Moderately Isolated (06/05/2021)   Social Connection and Isolation Panel [NHANES]    Frequency of Communication with Friends and Family: More than three times a week    Frequency of Social Gatherings with Friends and Family: More than three times a week    Attends Religious Services: Never    Marine scientist or Organizations: No    Attends Music therapist: Never    Marital Status: Married     Family History: The patient's family history includes CAD (age of onset: 59) in his brother; Cancer in his maternal uncle; Diabetes in his mother; Heart disease in his father and mother; Hypertension in his father; Migraines in his brother; Pulmonary fibrosis in his sister;  Sjogren's syndrome in his sister. There is no history of Stroke or Colon cancer.  ROS:   Please see the history of present illness.     All other systems reviewed and are negative.  EKGs/Labs/Other Studies Reviewed:    The following studies were reviewed today:  Cath 11/12/2019 A stent was successfully placed.   Acute inferior ST elevation myocardial infarction with ongoing pain for greater than 9 hours. Totally occluded proximal to mid RCA treated with 22 x 3.0 Onyx DES postdilated to 3.5 mm with TIMI grade III flow and resolution of symptoms.  The PDA contains segmental mid vessel 50 percent stenosis. Widely patent, short left main Widely patent LAD with proximal to mid eccentric 50 percent stenosis and luminal irregularities beyond. Widely patent circumflex with luminal irregularities. LV reveals inferior wall hypokinesis.  EF 55 percent.  LVEDP 16 mmHg.   RECOMMENDATIONS:   Aggressive risk factor modification. If A1c significantly elevated, consider adding SGLT2 therapy. Lipid panel and if LDL greater than 70, intensify management by adding ezetimibe or PCSK9. Phase 2 cardiac rehab.  Echo 01/30/22: IMPRESSIONS     1. Left ventricular ejection fraction, by estimation, is 50 to 55%. The  left ventricle has low normal function. The left ventricle has no regional  wall motion abnormalities. There is moderate concentric left ventricular  hypertrophy. Left ventricular  diastolic parameters are consistent with Grade I diastolic dysfunction  (impaired relaxation). There is the interventricular septum is flattened  in systole, consistent with right ventricular pressure overload.   2. +Mconnell's sign. Right ventricular systolic function is normal. The  right ventricular size is moderately enlarged.   3. No evidence of mitral valve regurgitation.   4. The aortic valve is tricuspid. Aortic valve regurgitation is not  visualized.   5. The inferior vena  cava is normal in size with  greater than 50%  respiratory variability, suggesting right atrial pressure of 3 mmHg.   Conclusion(s)/Recommendation(s): RV dilation and septal flattening  consistent with pulmonary embolism with RV strain.   FINDINGS    EKG:  EKG is not ordered today.    Recent Labs: 10/14/2021: TSH 2.00 01/02/2022: B Natriuretic Peptide 16.8 01/31/2022: Magnesium 1.6 02/03/2022: ALT 27; BUN 23; Creatinine, Ser 1.34; Hemoglobin 12.6; Platelets 217.0; Potassium 5.2 Hemolysis seen..; Sodium 136  Recent Lipid Panel    Component Value Date/Time   CHOL 102 06/09/2021 0729   CHOL 112 11/01/2018 0000   TRIG 100.0 06/09/2021 0729   HDL 32.20 (L) 06/09/2021 0729   HDL 37 (L) 11/01/2018 0000   CHOLHDL 3 06/09/2021 0729   VLDL 20.0 06/09/2021 0729   LDLCALC 49 06/09/2021 0729   LDLCALC 59 11/01/2018 0000    Physical Exam:    VS:  BP 108/78 (BP Location: Left Arm, Patient Position: Sitting, Cuff Size: Normal)   Pulse 91   Ht '5\' 7"'$  (1.702 m)   Wt 205 lb 6.4 oz (93.2 kg)   SpO2 98%   BMI 32.17 kg/m     Wt Readings from Last 3 Encounters:  02/11/22 205 lb 6.4 oz (93.2 kg)  02/03/22 211 lb 6 oz (95.9 kg)  01/29/22 205 lb (93 kg)     GEN:  Well nourished, obese, in no acute distress HEENT: Normal NECK: No JVD; No carotid bruits LYMPHATICS: No lymphadenopathy CARDIAC: RRR, no murmurs, rubs, gallops RESPIRATORY:  Clear to auscultation without rales, wheezing or rhonchi  ABDOMEN: Soft, non-tender, non-distended MUSCULOSKELETAL:  minimal edema right calf. Compression hose in place. Some tenderness to  SKIN: Warm and dry NEUROLOGIC:  Alert and oriented x 3 PSYCHIATRIC:  Normal affect   ASSESSMENT:    1. Deep vein thrombosis (DVT) of lower extremity, unspecified chronicity, unspecified laterality, unspecified vein (HCC)   2. Bilateral pulmonary embolism ([place])   3. Coronary artery disease involving native coronary artery of native heart without angina pectoris   4. Hyperlipidemia LDL goal <70    5. Essential hypertension, benign     PLAN:    In order of problems listed above:  CAD: s/p  cardiac catheterization for inferior STEMI and underwent DES to RCA in July 2021.  Continue aspirin. On beta blocker and statin. Stressed importance of lifestyle modification with daily aerobic activity of 30-45 minutes.   Hypertension: Blood pressure well controlled.   Hyperlipidemia: Continue statin therapy. Last LDL at goal  DM2: Managed by his endocrinologist. Last A1vc 9.3%  History of DVT: with documented recurrence in October with RLE DVT and PE. S/p thrombectomy. Now back on Xarelto indefinitely. Some RV strain noted on Echo. Will repeat in 3 months.  Follow up in 3 months    Medication Adjustments/Labs and Tests Ordered: Current medicines are reviewed at length with the patient today.  Concerns regarding medicines are outlined above.  No orders of the defined types were placed in this encounter.  No orders of the defined types were placed in this encounter.   There are no Patient Instructions on file for this visit.   Signed, Toriano Aikey Martinique, MD  02/11/2022 9:18 AM    [facility]

## 2022-02-11 ENCOUNTER — Ambulatory Visit: Payer: Medicare HMO | Attending: Cardiology | Admitting: Cardiology

## 2022-02-11 ENCOUNTER — Encounter: Payer: Self-pay | Admitting: Cardiology

## 2022-02-11 VITALS — BP 108/78 | HR 91 | Ht 67.0 in | Wt 205.4 lb

## 2022-02-11 DIAGNOSIS — I82409 Acute embolism and thrombosis of unspecified deep veins of unspecified lower extremity: Secondary | ICD-10-CM

## 2022-02-11 DIAGNOSIS — E785 Hyperlipidemia, unspecified: Secondary | ICD-10-CM

## 2022-02-11 DIAGNOSIS — I10 Essential (primary) hypertension: Secondary | ICD-10-CM

## 2022-02-11 DIAGNOSIS — I2699 Other pulmonary embolism without acute cor pulmonale: Secondary | ICD-10-CM | POA: Diagnosis not present

## 2022-02-11 DIAGNOSIS — I2510 Atherosclerotic heart disease of native coronary artery without angina pectoris: Secondary | ICD-10-CM | POA: Diagnosis not present

## 2022-02-11 NOTE — Addendum Note (Signed)
Addended by: Kathyrn Lass on: 02/11/2022 09:28 AM   Modules accepted: Orders

## 2022-02-11 NOTE — Patient Instructions (Signed)
Medication Instructions:  Continue same medications *If you need a refill on your cardiac medications before your next appointment, please call your pharmacy*   Lab Work: None ordered   Testing/Procedures: Schedule Echo in 3 months  04/2022   Follow-Up: At [facility], you and your health needs are our priority.  As part of our continuing mission to provide you with exceptional heart care, we have created designated Provider Care Teams.  These Care Teams include your primary Cardiologist (physician) and Advanced Practice Providers (APPs -  Physician Assistants and Nurse Practitioners) who all work together to provide you with the care you need, when you need it.  We recommend signing up for the patient portal called "MyChart".  Sign up information is provided on this After Visit Summary.  MyChart is used to connect with patients for Virtual Visits (Telemedicine).  Patients are able to view lab/test results, encounter notes, upcoming appointments, etc.  Non-urgent messages can be sent to your provider as well.   To learn more about what you can do with MyChart, go to [email or web address].    Your next appointment:  3 months after Echo    The format for your next appointment: Office   Provider:  [place]

## 2022-02-24 ENCOUNTER — Other Ambulatory Visit: Payer: Self-pay | Admitting: *Deleted

## 2022-02-24 DIAGNOSIS — I82421 Acute embolism and thrombosis of right iliac vein: Secondary | ICD-10-CM

## 2022-03-03 ENCOUNTER — Ambulatory Visit (INDEPENDENT_AMBULATORY_CARE_PROVIDER_SITE_OTHER)
Admission: RE | Admit: 2022-03-03 | Discharge: 2022-03-03 | Disposition: A | Discharge status: Home / Self Care | Payer: Medicare HMO | Source: Ambulatory Visit | Attending: Vascular Surgery | Admitting: Vascular Surgery

## 2022-03-03 ENCOUNTER — Ambulatory Visit (INDEPENDENT_AMBULATORY_CARE_PROVIDER_SITE_OTHER): Payer: Medicare HMO | Admitting: Physician Assistant

## 2022-03-03 ENCOUNTER — Ambulatory Visit (HOSPITAL_COMMUNITY)
Admission: RE | Admit: 2022-03-03 | Discharge: 2022-03-03 | Disposition: A | Discharge status: Home / Self Care | Payer: Medicare HMO | Source: Ambulatory Visit | Attending: Vascular Surgery | Admitting: Vascular Surgery

## 2022-03-03 VITALS — BP 133/85 | HR 74 | Temp 97.9°F | Resp 20 | Ht 67.0 in | Wt 203.1 lb

## 2022-03-03 DIAGNOSIS — I82421 Acute embolism and thrombosis of right iliac vein: Secondary | ICD-10-CM | POA: Insufficient documentation

## 2022-03-03 DIAGNOSIS — I82401 Acute embolism and thrombosis of unspecified deep veins of right lower extremity: Secondary | ICD-10-CM | POA: Diagnosis not present

## 2022-03-03 MED ORDER — RIVAROXABAN 10 MG PO TABS: 10.0000 mg | ORAL_TABLET | Freq: Every day | ORAL | 3 refills | Status: DC

## 2022-03-03 NOTE — Progress Notes (Signed)
[facility]   Office Note  History of Present Illness:  Alan Morrison is a 71 y.o. year old male who presents for follow up.  He had an extensive right leg DVT with thrombus extending into the vena cava.  This required percutaneous mechanical venous thrombectomy of the right popliteal, superficial femoral, common femoral, iliac vein and distal IVC on 01/30/2022 by Dr. Carlis Abbott.  Unfortunately this was the patient's second DVT, so he was started on a Xarelto starter pack and was told that he would likely need to be on lifelong anticoagulation due to recurrent DVT.  His previous DVT was in the left lower extremity extending from the proximal femoral vein to popliteal vein.  At follow-up today, he reports his right leg is doing much better since the procedure.  He endorses some right calf soreness that is getting better.  His swelling has greatly improved and benefits from wearing his compression stockings daily.  He denies any recurrent increase in swelling or sudden onset of right leg pain.   He just ran out of his Xarelto starter pack yesterday.  He was not discharged with any more Xarelto than the starter pack.  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   by [place]   CAD (coronary artery disease) 2003   cath - Min Dz, EF 65%, Adm.R/O'd   Cervical spondylosis without myelopathy    Chest pain, atypical    Choroidal nevus 01/22/2011   left, yearly eye exam, no diabetic retinopathy   Diabetes mellitus type II    DVT of deep femoral vein, left ([place]) 04/29/2018   LLE DVT from proximal femoral vein to popliteal vein into proximal calf (04/29/2018) started on xarelto Heme ([place]) recommended continuing lifelong lower dose anticoagulant indefinitely (11/2018)    Dyspepsia    Ex-smoker    Fatty liver 02/2016   by [place]   GERD (gastroesophageal reflux disease)    Headache(784.0)    Heart murmur    Hx of   History of MRI of brain and brain stem 09/06   History of MRSA infection 12/2013   R knee boil   HLD (hyperlipidemia)    Hyperuricemia    Obesity    Sleep apnea    Stress-induced cardiomyopathy 09/27/98   WNL, EF 64%   Urosepsis 01/01-01/05/10   Hospitalization    Past Surgical History:  Procedure Laterality Date   CARDIAC CATHETERIZATION     COLONOSCOPY  07/2013   1 benign polyp rpt 10 yrs [name])   CORONARY THROMBECTOMY N/A 11/12/2019   Procedure: Coronary Thrombectomy;  Surgeon: Belva Crome, MD;  Location: [facility];  Service: Cardiovascular;  Laterality: N/A;   CORONARY/GRAFT ACUTE MI REVASCULARIZATION N/A 11/12/2019   Procedure: Coronary/Graft Acute MI Revascularization;  Surgeon: Belva Crome, MD;  Location: [facility];  Service: Cardiovascular;  Laterality: N/A;   INTRAVASCULAR ULTRASOUND/IVUS N/A 01/30/2022   Procedure: Intravascular Ultrasound/IVUS;  Surgeon: Marty Heck, MD;  Location: [facility];  Service: Cardiovascular;  Laterality: N/A;  Venous   KNEE ARTHROSCOPY Left 1988   KNEE SURGERY Right 05/21/03   Right, Dr. Marlou Sa, med meniscus tear via MRI   LEFT HEART CATH AND CORONARY ANGIOGRAPHY N/A 04/01/2017   Procedure: LEFT HEART CATH  AND CORONARY ANGIOGRAPHY;  Surgeon: Martinique, Peter M, MD;  Location: [facility];  Service: Cardiovascular;  Laterality: N/A;   LEFT HEART CATH AND CORONARY ANGIOGRAPHY N/A 11/12/2019   Procedure: LEFT HEART CATH AND CORONARY ANGIOGRAPHY;  Surgeon: Belva Crome, MD;  Location: [facility];  Service: Cardiovascular;  Laterality: N/A;   MYELOGRAM  08/05   bulging disc   PERIPHERAL VASCULAR BALLOON ANGIOPLASTY  01/30/2022   Procedure: PERIPHERAL VASCULAR BALLOON ANGIOPLASTY;  Surgeon: Marty Heck, MD;  Location: [facility];   Service: Cardiovascular;;   PERIPHERAL VASCULAR THROMBECTOMY Right 01/30/2022   Procedure: PERIPHERAL VASCULAR THROMBECTOMY;  Surgeon: Marty Heck, MD;  Location: [facility];  Service: Cardiovascular;  Laterality: Right;    ROS:   General:  No weight loss, Fever, chills  HEENT: No recent headaches, no nasal bleeding, no visual changes, no sore throat  Neurologic: No dizziness, blackouts, seizures. No recent symptoms of stroke or mini- stroke. No recent episodes of slurred speech, or temporary blindness.  Cardiac: No recent episodes of chest pain/pressure, no shortness of breath at rest.  No shortness of breath with exertion.  Denies history of atrial fibrillation or irregular heartbeat  Vascular: No history of rest pain in feet.  No history of claudication.  No history of non-healing ulcer   Pulmonary: No home oxygen, no productive cough, no hemoptysis,  No asthma or wheezing  Musculoskeletal:  '[ ]'$  Arthritis, '[ ]'$  Low back pain,  '[ ]'$  Joint pain  Gastrointestinal: No hematochezia or melena,  No gastroesophageal reflux, no trouble swallowing  Urinary: '[ ]'$  chronic Kidney disease, '[ ]'$  on HD - '[ ]'$  MWF or '[ ]'$  TTHS, '[ ]'$  Burning with urination, '[ ]'$  Frequent urination, '[ ]'$  Difficulty urinating;   Skin: No rashes  Psychological: No history of anxiety,  No history of depression  Social History Social History   Tobacco Use   Smoking status: Former    Types: Cigarettes    Quit date: 04/20/1974    Years since quitting: 47.9    Passive exposure: Never   Smokeless tobacco: Never   Tobacco comments:    quit over 20 years  Vaping Use   Vaping Use: Never used  Substance Use Topics   Alcohol use: Yes    Alcohol/week: 3.0 standard drinks of alcohol    Types: 3 Cans of beer per week    Comment: occasional   Drug use: No    Family History Family History  Problem Relation Age of Onset   Heart disease Mother        CHF   Diabetes Mother    Heart disease Father         CHF   Hypertension Father    Pulmonary fibrosis Sister    Sjogren's syndrome Sister    Migraines Brother        severe headaches from arsenic in the past from  wood that was treated on his deck   CAD Brother 54       stent   Cancer Maternal Uncle        unsure   Stroke Neg Hx    Colon cancer Neg Hx     Allergies  No Known Allergies   Current Outpatient Medications  Medication Sig Dispense Refill   acetaminophen (TYLENOL) 500 MG tablet Take 1,000 mg by mouth 2 (two) times daily as needed for headache (pain).     aspirin EC 81 MG tablet Take 81  mg by mouth daily with supper. Swallow whole.     Calcium Carbonate Antacid (TUMS PO) Take 1 tablet by mouth daily as needed (acid reflux/indigestion).     Cholecalciferol (VITAMIN D) 2000 units tablet Take 2,000 Units by mouth daily with breakfast.      finasteride (PROSCAR) 5 MG tablet TAKE 1 TABLET BY MOUTH EVERY DAY (Patient taking differently: Take 5 mg by mouth daily.) 90 tablet 3   Insulin Glargine (BASAGLAR KWIKPEN) 100 UNIT/ML Inject 100 Units into the skin at bedtime.     Insulin Pen Needle (B-D ULTRAFINE III SHORT PEN) 31G X 8 MM MISC 1 Device by Other route 4 (four) times daily. USE AS DIRECTED DAILY 120 each 11   Iron, Ferrous Sulfate, 325 (65 Fe) MG TABS Take 325 mg by mouth every other day.     losartan (COZAAR) 25 MG tablet Take 1 tablet (25 mg total) by mouth daily. 90 tablet 1   metFORMIN (GLUCOPHAGE) 1000 MG tablet Take 1 tablet (1,000 mg total) by mouth daily with breakfast. TAKE 1 TABLET BY MOUTH EVERY DAY AT BREAKFAST Strength: 1,000 mg (Patient taking differently: Take 2,000 mg by mouth 2 (two) times daily with a meal.) 90 tablet 3   metoprolol tartrate (LOPRESSOR) 25 MG tablet TAKE 0.5 TABLETS BY MOUTH 2 TIMES DAILY. (Patient taking differently: Take 12.5 mg by mouth 2 (two) times daily.) 90 tablet 2   Multiple Vitamin (MULTIVITAMIN WITH MINERALS) TABS tablet Take 1 tablet by mouth daily with breakfast.       nitroGLYCERIN (NITROSTAT) 0.4 MG SL tablet PLACE 1 TABLET UNDER THE TONGUE EVERY 5 MINUTES AS NEEDED FOR CHEST PAIN (Patient taking differently: Place 0.4 mg under the tongue every 5 (five) minutes as needed for chest pain.) 25 tablet 11   ONETOUCH ULTRA test strip USE TO CHECK SUGARS DAILY  AS DIRECTED 100 strip 3   probenecid (BENEMID) 500 MG tablet Take 0.5 tablets (250 mg total) by mouth every other day. 45 tablet 3   RIVAROXABAN (XARELTO) VTE STARTER PACK (15 & 20 MG) Follow package directions: Take one '15mg'$  tablet by mouth twice a day. On day 22, switch to one '20mg'$  tablet once a day. Take with food. 51 each 0   rosuvastatin (CRESTOR) 40 MG tablet Take 1 tablet (40 mg total) by mouth daily with supper. 90 tablet 2   tamsulosin (FLOMAX) 0.4 MG CAPS capsule Take 1 capsule (0.4 mg total) by mouth daily. 90 capsule 3   TRULICITY 4.33 IR/5.1OA SOPN Inject 0.75 mg into the skin once a week.     No current facility-administered medications for this visit.    Physical Examination  Vitals:   03/03/22 1008  BP: 133/85  Pulse: 74  Resp: 20  Temp: 97.9 F (36.6 C)  TempSrc: Temporal  SpO2: 98%  Weight: 203 lb 1.6 oz (92.1 kg)  Height: '5\' 7"'$  (1.702 m)    Body mass index is 31.81 kg/m.  General:  Alert and oriented, no acute distress HEENT: Normal Neck: No bruit or JVD Pulmonary: Clear to auscultation bilaterally Cardiac: Regular Rate and Rhythm without murmur Abdomen: Soft, non-tender, non-distended, no mass, no scars Skin: No rash Extremity Pulses:  2+ DP pulses bilaterally. Musculoskeletal: Trace bilateral lower extremity edema.  Wearing right leg knee-high compression stocking Neurologic: Upper and lower extremity motor 5/5 and symmetric  DATA:   RLE Venous Reflux Study: (03/03/2022) +--------------+---------+------+-----------+------------+--------+  RIGHT        Reflux NoRefluxReflux TimeDiameter cmsComments  Yes                                    +--------------+---------+------+-----------+------------+--------+  CFV                    yes   >1 second                       +--------------+---------+------+-----------+------------+--------+  FV prox       no                                              +--------------+---------+------+-----------+------------+--------+  FV mid                  yes   >1 second                       +--------------+---------+------+-----------+------------+--------+  FV dist       no                                              +--------------+---------+------+-----------+------------+--------+  Popliteal    no                                              +--------------+---------+------+-----------+------------+--------+  GSV at SFJ              yes    >500 ms      0.69              +--------------+---------+------+-----------+------------+--------+  GSV prox thigh          yes    >500 ms      0.50              +--------------+---------+------+-----------+------------+--------+  GSV mid thigh no                            0.31              +--------------+---------+------+-----------+------------+--------+  GSV dist thighno                            0.29              +--------------+---------+------+-----------+------------+--------+  GSV at knee             yes    >500 ms      0.22              +--------------+---------+------+-----------+------------+--------+  GSV prox calf no                            0.19              +--------------+---------+------+-----------+------------+--------+  SSV Pop Fossa no                            0.41              +--------------+---------+------+-----------+------------+--------+  SSV prox calf           yes    >500 ms      0.26              +--------------+---------+------+-----------+------------+--------+    IVC/Iliac Venous US: (03/03/2022)  IVC/Iliac:  No evidence of thrombus in IVC and Iliac veins. Visualization of proximal Inferior Vena Cava and distal common Iliac was limited.  ASSESSMENT/PLAN:   -Based on the patient's right lower extremity venous reflux study, he does have reflux in his right common femoral vein and mid femoral vein.  He also has reflux in his greater saphenous vein at the saphenofemoral junction, proximal thigh, and knee.  He has reflux in his SSV in the proximal calf.  He would not be a candidate for venous ablation at this time since his reflux in the greater saphenous vein does not extend throughout the course of the thigh and the vein itself is not >42m in size -Patient the patient's IVC/Iliac venous ultrasound today, he has no recurrent DVT. -His symptoms have greatly improved after thrombectomy.  He is still wearing his compression stockings daily.  I have encouraged him to elevate his legs as needed for swelling -He has just finished his Xarelto starter pack.  Since his right lower extremity DVT is now his second DVT, he likely will require lifelong anticoagulation.  I will prescribe him Xarelto 10 mg 1x daily as maintenance prevention of recurrent DVT/PE.  He can continue to get this refilled by our office or eventually request that his PCP fills future refills -He can see [place]  MVicente Serene PA-C [facility] [phone] Office MD: CCarlis Abbott

## 2022-03-17 DIAGNOSIS — E1159 Type 2 diabetes mellitus with other circulatory complications: Secondary | ICD-10-CM | POA: Diagnosis not present

## 2022-03-17 DIAGNOSIS — N1831 Chronic kidney disease, stage 3a: Secondary | ICD-10-CM | POA: Diagnosis not present

## 2022-03-17 DIAGNOSIS — I700 Atherosclerosis of aorta: Secondary | ICD-10-CM | POA: Diagnosis not present

## 2022-03-17 DIAGNOSIS — Z794 Long term (current) use of insulin: Secondary | ICD-10-CM | POA: Diagnosis not present

## 2022-03-17 DIAGNOSIS — E278 Other specified disorders of adrenal gland: Secondary | ICD-10-CM | POA: Diagnosis not present

## 2022-03-17 DIAGNOSIS — E1122 Type 2 diabetes mellitus with diabetic chronic kidney disease: Secondary | ICD-10-CM | POA: Diagnosis not present

## 2022-03-19 ENCOUNTER — Other Ambulatory Visit: Payer: Self-pay | Admitting: Cardiology

## 2022-03-19 DIAGNOSIS — E278 Other specified disorders of adrenal gland: Secondary | ICD-10-CM | POA: Diagnosis not present

## 2022-03-27 ENCOUNTER — Ambulatory Visit: Payer: Medicare HMO | Admitting: Cardiology

## 2022-05-04 ENCOUNTER — Ambulatory Visit (HOSPITAL_COMMUNITY): Payer: Medicare HMO | Attending: Cardiology

## 2022-05-04 DIAGNOSIS — I2510 Atherosclerotic heart disease of native coronary artery without angina pectoris: Secondary | ICD-10-CM | POA: Diagnosis not present

## 2022-05-04 DIAGNOSIS — E785 Hyperlipidemia, unspecified: Secondary | ICD-10-CM

## 2022-05-04 DIAGNOSIS — I82409 Acute embolism and thrombosis of unspecified deep veins of unspecified lower extremity: Secondary | ICD-10-CM | POA: Diagnosis not present

## 2022-05-04 DIAGNOSIS — I10 Essential (primary) hypertension: Secondary | ICD-10-CM

## 2022-05-04 DIAGNOSIS — I2699 Other pulmonary embolism without acute cor pulmonale: Secondary | ICD-10-CM

## 2022-05-04 LAB — ECHOCARDIOGRAM COMPLETE
Area-P 1/2: 6.48 cm2
MV M vel: 2.49 m/s
MV Peak grad: 24.8 mmHg
S' Lateral: 3.6 cm

## 2022-05-05 NOTE — Progress Notes (Signed)
Cardiology Office Note:    Date:  05/11/2022   ID:  Alan Morrison, DOB Aug 18, 1950, MRN [id number]  PCP:  [name], MD  [facility] Cardiologist:  Philbert Ocallaghan Martinique, MD  [place] Electrophysiologist:  None   Referring MD: [name], MD   No chief complaint on file.   History of Present Illness:    Alan Morrison is a 72 y.o. male with a hx of HLD, DM II, stress cardiomyopathy, OSA on CPAP, history of DVT on Xarelto, former smoker, and CAD.  Patient had nonobstructive CAD by angiography in 2018.  More recently, he presented with inferior wall acute STEMI on 11/12/2019.  Initial EKG did identify inferior Q waves with persistent ST elevation in the inferior lead.  Cardiac catheterization performed on 11/12/2019 showed total occlusion of proximal to mid RCA treated with 3.0 x 22 mm Onyx DES postdilated to 3.5 mm, 30% mid PDA vessel lesion, widely patent left main, 50% proximal to mid LAD lesion, EF 55%, LVEDP 16 mmHg.  Postprocedure, patient was placed on triple therapy including aspirin, Plavix and Xarelto.  The plan is to discontinue aspirin after 1 month.  Echocardiogram showed EF 45 to 50%.  During the hospitalization patient also had frequent hypoglycemic spells at home, he was not very compliant with insulin at home which explains the hypoglycemic episodes when he was placed on the full-strength insulin.  Lovenox was stopped and he was discharged on long-acting insulin only with plan to be evaluated by his endocrinologist for further medication adjustment.  He was seen by Dr. Jana Hakim on 11/20/2019 who discontinued the Xarelto. He did recommend repeating a D dimer at some point.   He was admitted in September after presenting to the ER with a 3-day history of left calf pain with no trauma history. He has also had a 4-week history of worsening dyspnea on exertion.  Upon arrival, patient was hemodynamically stable. BNP normal at 16.8,D-dimer is elevated at 1.35, Sodium 132, potassium  6.3, BUN of 51, creatinine 2.4.  Due to elevated creatinine, CTA was not obtained.  Subsequently lower extremity Doppler was negative for DVT.  Patient was never hypoxic.  Clinical suspicion for PE was very low.  Anticoagulation was discontinued.  Patient continued to improve despite of discontinuation anticoagulation.  Patient echo was repeated which once again showed ejection fraction of 45 to 50% with left ventricular regional wall abnormalities, essentially no change compared to the echo done 2 years ago.  He was then admitted again in October with worsening swelling and pain in RLE. LE dopplers this time showed occlusive thrombus in the external iliac, common femoral, superficial femoral, and deep femoral veins on the right -vascular surgery consulted, patient was taken to the OR on 10/13 and he is status post percutaneous mechanical venous thrombectomy of the right popliteal, superficial femoral, common femoral, iliac vein and distal IVC; Percutaneous mechanical venous thrombectomy of the right popliteal, superficial femoral, common femoral, iliac vein and distal IVC; Angioplasty of the right iliac vein and distal IVC (10 mm x 60 mm [place]ang).  He recovered well postoperatively, leg swelling has improved significantly, he is able to ambulate in the hallway without significant difficulties.  He was started on Xarelto. He did have Acute PE with  moderate burden, clinically without chest pain, tachycardia, significant hypotension.  2D echo shows an EF of 08-65%, grade 1 diastolic dysfunction, RV moderately enlarged with normal systolic function.  He is normotensive, satting well on room air.  On follow up today he  notes RLE swelling has resolved. No calf pain. No SOB, chest pain or palpitations. No bleeding issues.  Repeat Echo 05/04/22 showed stable EF with resolution of RV strain/dysfunction.  He states he just started on a new diabetic drug yesterday.    Past Medical History:  Diagnosis Date   Acute  ST elevation myocardial infarction (STEMI) of inferior wall (HCC) 11/12/2019   Mid RCA 100%--> 22 x 3.0 Onyx to 3.5 mm   Anemia, iron deficiency, inadequate dietary intake    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   by [place]   CAD (coronary artery disease) 2003   cath - Min Dz, EF 65%, Adm.R/O'd   Cervical spondylosis without myelopathy    Chest pain, atypical    Choroidal nevus 01/22/2011   left, yearly eye exam, no diabetic retinopathy   Diabetes mellitus type II    DVT of deep femoral vein, left ([place]) 04/29/2018   LLE DVT from proximal femoral vein to popliteal vein into proximal calf (04/29/2018) started on xarelto Heme ([place]) recommended continuing lifelong lower dose anticoagulant indefinitely (11/2018)   Dyspepsia    Ex-smoker    Fatty liver 02/2016   by [place]   GERD (gastroesophageal reflux disease)    Headache(784.0)    Heart murmur    Hx of   History of MRI of brain and brain stem 09/06   History of MRSA infection 12/2013   R knee boil   HLD (hyperlipidemia)    Hyperuricemia    Obesity    Sleep apnea    Stress-induced cardiomyopathy 09/27/98   WNL, EF 64%   Urosepsis 01/01-01/05/10   Hospitalization    Past Surgical History:  Procedure Laterality Date   CARDIAC CATHETERIZATION     COLONOSCOPY  07/2013   1 benign polyp rpt 10 yrs [name])   CORONARY THROMBECTOMY N/A 11/12/2019   Procedure: Coronary Thrombectomy;  Surgeon: Belva Crome, MD;  Location: [place] [facility];  Service: Cardiovascular;  Laterality: N/A;   CORONARY/GRAFT ACUTE MI REVASCULARIZATION N/A 11/12/2019   Procedure: Coronary/Graft Acute MI Revascularization;  Surgeon: Belva Crome, MD;  Location: [place] [facility];  Service: Cardiovascular;  Laterality: N/A;   INTRAVASCULAR ULTRASOUND/IVUS N/A 01/30/2022   Procedure: Intravascular Ultrasound/IVUS;  Surgeon: Marty Heck, MD;  Location: [place] [facility];  Service: Cardiovascular;  Laterality: N/A;  Venous   KNEE ARTHROSCOPY Left 1988   KNEE  SURGERY Right 05/21/03   Right, Dr. Marlou Sa, med meniscus tear via MRI   LEFT HEART CATH AND CORONARY ANGIOGRAPHY N/A 04/01/2017   Procedure: LEFT HEART CATH AND CORONARY ANGIOGRAPHY;  Surgeon: Martinique, Twylia Oka M, MD;  Location: [place] [facility];  Service: Cardiovascular;  Laterality: N/A;   LEFT HEART CATH AND CORONARY ANGIOGRAPHY N/A 11/12/2019   Procedure: LEFT HEART CATH AND CORONARY ANGIOGRAPHY;  Surgeon: Belva Crome, MD;  Location: [place] [facility];  Service: Cardiovascular;  Laterality: N/A;   MYELOGRAM  08/05   bulging disc   PERIPHERAL VASCULAR BALLOON ANGIOPLASTY  01/30/2022   Procedure: PERIPHERAL VASCULAR BALLOON ANGIOPLASTY;  Surgeon: Marty Heck, MD;  Location: [place] [facility];  Service: Cardiovascular;;   PERIPHERAL VASCULAR THROMBECTOMY Right 01/30/2022   Procedure: PERIPHERAL VASCULAR THROMBECTOMY;  Surgeon: Marty Heck, MD;  Location: [place] [facility];  Service: Cardiovascular;  Laterality: Right;    Current Medications: Current Meds  Medication Sig   acetaminophen (TYLENOL) 500 MG tablet Take 1,000 mg by mouth 2 (two) times daily as needed for headache (pain).  aspirin EC 81 MG tablet Take 81 mg by mouth daily with supper. Swallow whole.   Calcium Carbonate Antacid (TUMS PO) Take 1 tablet by mouth daily as needed (acid reflux/indigestion).   Cholecalciferol (VITAMIN D) 2000 units tablet Take 2,000 Units by mouth daily with breakfast.    finasteride (PROSCAR) 5 MG tablet TAKE 1 TABLET BY MOUTH EVERY DAY (Patient taking differently: Take 5 mg by mouth daily.)   Insulin Glargine (BASAGLAR KWIKPEN) 100 UNIT/ML Inject 100 Units into the skin at bedtime.   Insulin Pen Needle (B-D ULTRAFINE III SHORT PEN) 31G X 8 MM MISC 1 Device by Other route 4 (four) times daily. USE AS DIRECTED DAILY   Iron, Ferrous Sulfate, 325 (65 Fe) MG TABS Take 325 mg by mouth every other day.   losartan (COZAAR) 25 MG tablet TAKE 1 TABLET (25 MG TOTAL) BY MOUTH DAILY.    metFORMIN (GLUCOPHAGE) 1000 MG tablet Take 1 tablet (1,000 mg total) by mouth daily with breakfast. TAKE 1 TABLET BY MOUTH EVERY DAY AT BREAKFAST Strength: 1,000 mg (Patient taking differently: Take 2,000 mg by mouth 2 (two) times daily with a meal.)   metoprolol tartrate (LOPRESSOR) 25 MG tablet TAKE 0.5 TABLETS BY MOUTH 2 TIMES DAILY. (Patient taking differently: Take 12.5 mg by mouth 2 (two) times daily.)   Multiple Vitamin (MULTIVITAMIN WITH MINERALS) TABS tablet Take 1 tablet by mouth daily with breakfast.    nitroGLYCERIN (NITROSTAT) 0.4 MG SL tablet PLACE 1 TABLET UNDER THE TONGUE EVERY 5 MINUTES AS NEEDED FOR CHEST PAIN (Patient taking differently: Place 0.4 mg under the tongue every 5 (five) minutes as needed for chest pain.)   ONETOUCH ULTRA test strip USE TO CHECK SUGARS DAILY  AS DIRECTED   probenecid (BENEMID) 500 MG tablet Take 0.5 tablets (250 mg total) by mouth every other day.   rivaroxaban (XARELTO) 10 MG TABS tablet Take 1 tablet (10 mg total) by mouth daily.   rosuvastatin (CRESTOR) 40 MG tablet Take 1 tablet (40 mg total) by mouth daily with supper.   tamsulosin (FLOMAX) 0.4 MG CAPS capsule Take 1 capsule (0.4 mg total) by mouth daily.   TRULICITY 6.26 RS/8.5IO SOPN Inject 0.75 mg into the skin once a week.     Allergies:   Patient has no known allergies.   Social History   Socioeconomic History   Marital status: Married    Spouse name: Not on file   Number of children: 2   Years of education: 12   Highest education level: High school graduate  Occupational History   Occupation: Maintenance    Employer: [employer]  Tobacco Use   Smoking status: Former    Types: Cigarettes    Quit date: 04/20/1974    Years since quitting: 48.0    Passive exposure: Never   Smokeless tobacco: Never   Tobacco comments:    quit over 20 years  Vaping Use   Vaping Use: Never used  Substance and Sexual Activity   Alcohol use: Yes    Alcohol/week: 3.0 standard drinks of  alcohol    Types: 3 Cans of beer per week    Comment: occasional   Drug use: No   Sexual activity: Not on file  Other Topics Concern   Not on file  Social History Narrative   Lives with wife   Occupation: equipment maintenance   Activity: no regular exercise - hunts   Diet: good water, fruits/vegetables   Social Determinants of Radio broadcast assistant  Strain: Low Risk  (01/05/2022)   Overall Financial Resource Strain (CARDIA)    Difficulty of Paying Living Expenses: Not hard at all  Food Insecurity: No Food Insecurity (01/30/2022)   Hunger Vital Sign    Worried About Running Out of Food in the Last Year: Never true    Ran Out of Food in the Last Year: Never true  Transportation Needs: No Transportation Needs (01/30/2022)   PRAPARE - Hydrologist (Medical): No    Lack of Transportation (Non-Medical): No  Physical Activity: Inactive (06/05/2021)   Exercise Vital Sign    Days of Exercise per Week: 0 days    Minutes of Exercise per Session: 0 min  Stress: No Stress Concern Present (06/05/2021)   [place]    Feeling of Stress : Not at all  Social Connections: Moderately Isolated (06/05/2021)   Social Connection and Isolation Panel [NHANES]    Frequency of Communication with Friends and Family: More than three times a week    Frequency of Social Gatherings with Friends and Family: More than three times a week    Attends Religious Services: Never    Marine scientist or Organizations: No    Attends Music therapist: Never    Marital Status: Married     Family History: The patient's family history includes CAD (age of onset: 78) in his brother; Cancer in his maternal uncle; Diabetes in his mother; Heart disease in his father and mother; Hypertension in his father; Migraines in his brother; Pulmonary fibrosis in his sister; Sjogren's syndrome in his sister. There is no  history of Stroke or Colon cancer.  ROS:   Please see the history of present illness.     All other systems reviewed and are negative.  EKGs/Labs/Other Studies Reviewed:    The following studies were reviewed today:  Cath 11/12/2019 A stent was successfully placed.   Acute inferior ST elevation myocardial infarction with ongoing pain for greater than 9 hours. Totally occluded proximal to mid RCA treated with 22 x 3.0 Onyx DES postdilated to 3.5 mm with TIMI grade III flow and resolution of symptoms.  The PDA contains segmental mid vessel 50 percent stenosis. Widely patent, short left main Widely patent LAD with proximal to mid eccentric 50 percent stenosis and luminal irregularities beyond. Widely patent circumflex with luminal irregularities. LV reveals inferior wall hypokinesis.  EF 55 percent.  LVEDP 16 mmHg.   RECOMMENDATIONS:   Aggressive risk factor modification. If A1c significantly elevated, consider adding SGLT2 therapy. Lipid panel and if LDL greater than 70, intensify management by adding ezetimibe or PCSK9. Phase 2 cardiac rehab.  Echo 01/30/22: IMPRESSIONS     1. Left ventricular ejection fraction, by estimation, is 50 to 55%. The  left ventricle has low normal function. The left ventricle has no regional  wall motion abnormalities. There is moderate concentric left ventricular  hypertrophy. Left ventricular  diastolic parameters are consistent with Grade I diastolic dysfunction  (impaired relaxation). There is the interventricular septum is flattened  in systole, consistent with right ventricular pressure overload.   2. +Mconnell's sign. Right ventricular systolic function is normal. The  right ventricular size is moderately enlarged.   3. No evidence of mitral valve regurgitation.   4. The aortic valve is tricuspid. Aortic valve regurgitation is not  visualized.   5. The inferior vena cava is normal in size with greater than 50%  respiratory variability,  suggesting right atrial pressure of 3 mmHg.   Conclusion(s)/Recommendation(s): RV dilation and septal flattening  consistent with pulmonary embolism with RV strain.    Echo 05/04/22: IMPRESSIONS     1. Left ventricular ejection fraction, by estimation, is 50 to 55%. The  left ventricle has low normal function. The left ventricle has no regional  wall motion abnormalities. There is mild concentric left ventricular  hypertrophy. Left ventricular  diastolic parameters are consistent with Grade I diastolic dysfunction  (impaired relaxation).   2. Right ventricular systolic function is normal. The right ventricular  size is normal. Tricuspid regurgitation signal is inadequate for assessing  PA pressure.   3. The mitral valve is normal in structure. Trivial mitral valve  regurgitation. No evidence of mitral stenosis.   4. The aortic valve is tricuspid. Aortic valve regurgitation is not  visualized. Aortic valve sclerosis/calcification is present, without any  evidence of aortic stenosis.   5. The inferior vena cava is normal in size with greater than 50%  respiratory variability, suggesting right atrial pressure of 3 mmHg.     EKG:  EKG is not ordered today.    Recent Labs: 10/14/2021: TSH 2.00 01/02/2022: B Natriuretic Peptide 16.8 01/31/2022: Magnesium 1.6 02/03/2022: ALT 27; BUN 23; Creatinine, Ser 1.34; Hemoglobin 12.6; Platelets 217.0; Potassium 5.2 Hemolysis seen..; Sodium 136  Recent Lipid Panel    Component Value Date/Time   CHOL 102 06/09/2021 0729   CHOL 112 11/01/2018 0000   TRIG 100.0 06/09/2021 0729   HDL 32.20 (L) 06/09/2021 0729   HDL 37 (L) 11/01/2018 0000   CHOLHDL 3 06/09/2021 0729   VLDL 20.0 06/09/2021 0729   LDLCALC 49 06/09/2021 0729   LDLCALC 59 11/01/2018 0000    Physical Exam:    VS:  BP 110/75 (BP Location: Left Arm, Patient Position: Sitting, Cuff Size: Large)   Pulse (!) 105   Ht '5\' 7"'$  (1.702 m)   Wt 208 lb 12.8 oz (94.7 kg)   SpO2 95%   BMI  32.70 kg/m     Wt Readings from Last 3 Encounters:  05/11/22 208 lb 12.8 oz (94.7 kg)  03/03/22 203 lb 1.6 oz (92.1 kg)  02/11/22 205 lb 6.4 oz (93.2 kg)     GEN:  Well nourished, obese, in no acute distress HEENT: Normal NECK: No JVD; No carotid bruits LYMPHATICS: No lymphadenopathy CARDIAC: RRR, no murmurs, rubs, gallops RESPIRATORY:  Clear to auscultation without rales, wheezing or rhonchi  ABDOMEN: Soft, non-tender, non-distended MUSCULOSKELETAL:  minimal edema right calf. Compression hose in place. Some tenderness to  SKIN: Warm and dry NEUROLOGIC:  Alert and oriented x 3 PSYCHIATRIC:  Normal affect   ASSESSMENT:    1. Coronary artery disease involving native coronary artery of native heart without angina pectoris   2. Deep vein thrombosis (DVT) of lower extremity, unspecified chronicity, unspecified laterality, unspecified vein (HCC)   3. Bilateral pulmonary embolism ([place])   4. Essential hypertension, benign   5. Hyperlipidemia LDL goal <70      PLAN:    In order of problems listed above:  CAD: s/p  cardiac catheterization for inferior STEMI and underwent DES to RCA in July 2021.  Continue aspirin. On beta blocker and statin. Stressed importance of lifestyle modification with daily aerobic activity of 30-45 minutes.   Hypertension: Blood pressure well controlled.   Hyperlipidemia: Continue statin therapy. Last LDL at goal  DM2: Managed by his endocrinologist. Last A1vc 9.3%  History of DVT/PE: with documented recurrence in October with  RLE DVT and PE. S/p thrombectomy. Now back on Xarelto indefinitely. Some RV strain noted on Echo. This resolved by repeat Echo.   Follow up in 6 months    Medication Adjustments/Labs and Tests Ordered: Current medicines are reviewed at length with the patient today.  Concerns regarding medicines are outlined above.  No orders of the defined types were placed in this encounter.  No orders of the defined types were placed in  this encounter.   Patient Instructions  Medication Instructions:  Your physician recommends that you continue on your current medications as directed. Please refer to the Current Medication list given to you today.  *If you need a refill on your cardiac medications before your next appointment, please call your pharmacy*   Lab Work: None   Testing/Procedures: None   Follow-Up: At [facility], you and your health needs are our priority.  As part of our continuing mission to provide you with exceptional heart care, we have created designated Provider Care Teams.  These Care Teams include your primary Cardiologist (physician) and Advanced Practice Providers (APPs -  Physician Assistants and Nurse Practitioners) who all work together to provide you with the care you need, when you need it.  We recommend signing up for the patient portal called "MyChart".  Sign up information is provided on this After Visit Summary.  MyChart is used to connect with patients for Virtual Visits (Telemedicine).  Patients are able to view lab/test results, encounter notes, upcoming appointments, etc.  Non-urgent messages can be sent to your provider as well.   To learn more about what you can do with MyChart, go to [email or web address].    Your next appointment:   6 month(s)  Provider:   Marian Meneely Martinique, MD     Other Instructions     Signed, Laylanie Kruczek Martinique, MD  05/11/2022 11:38 AM    [place]

## 2022-05-07 DIAGNOSIS — E1122 Type 2 diabetes mellitus with diabetic chronic kidney disease: Secondary | ICD-10-CM | POA: Diagnosis not present

## 2022-05-07 DIAGNOSIS — D3502 Benign neoplasm of left adrenal gland: Secondary | ICD-10-CM | POA: Diagnosis not present

## 2022-05-07 DIAGNOSIS — Z794 Long term (current) use of insulin: Secondary | ICD-10-CM | POA: Diagnosis not present

## 2022-05-07 DIAGNOSIS — N1831 Chronic kidney disease, stage 3a: Secondary | ICD-10-CM | POA: Diagnosis not present

## 2022-05-11 ENCOUNTER — Encounter: Payer: Self-pay | Admitting: Cardiology

## 2022-05-11 ENCOUNTER — Ambulatory Visit: Payer: Medicare HMO | Attending: Cardiology | Admitting: Cardiology

## 2022-05-11 VITALS — BP 110/75 | HR 105 | Ht 67.0 in | Wt 208.8 lb

## 2022-05-11 DIAGNOSIS — I2510 Atherosclerotic heart disease of native coronary artery without angina pectoris: Secondary | ICD-10-CM | POA: Diagnosis not present

## 2022-05-11 DIAGNOSIS — I10 Essential (primary) hypertension: Secondary | ICD-10-CM | POA: Diagnosis not present

## 2022-05-11 DIAGNOSIS — E785 Hyperlipidemia, unspecified: Secondary | ICD-10-CM | POA: Diagnosis not present

## 2022-05-11 DIAGNOSIS — I2699 Other pulmonary embolism without acute cor pulmonale: Secondary | ICD-10-CM

## 2022-05-11 DIAGNOSIS — I82409 Acute embolism and thrombosis of unspecified deep veins of unspecified lower extremity: Secondary | ICD-10-CM

## 2022-05-11 NOTE — Patient Instructions (Signed)
Medication Instructions:  Your physician recommends that you continue on your current medications as directed. Please refer to the Current Medication list given to you today.  *If you need a refill on your cardiac medications before your next appointment, please call your pharmacy*   Lab Work: None   Testing/Procedures: None   Follow-Up: At [facility], you and your health needs are our priority.  As part of our continuing mission to provide you with exceptional heart care, we have created designated Provider Care Teams.  These Care Teams include your primary Cardiologist (physician) and Advanced Practice Providers (APPs -  Physician Assistants and Nurse Practitioners) who all work together to provide you with the care you need, when you need it.  We recommend signing up for the patient portal called "MyChart".  Sign up information is provided on this After Visit Summary.  MyChart is used to connect with patients for Virtual Visits (Telemedicine).  Patients are able to view lab/test results, encounter notes, upcoming appointments, etc.  Non-urgent messages can be sent to your provider as well.   To learn more about what you can do with MyChart, go to [email or web address].    Your next appointment:   6 month(s)  Provider:   Peter Martinique, MD     Other Instructions

## 2022-06-02 ENCOUNTER — Other Ambulatory Visit: Payer: Self-pay | Admitting: Family Medicine

## 2022-06-03 DIAGNOSIS — H52223 Regular astigmatism, bilateral: Secondary | ICD-10-CM | POA: Diagnosis not present

## 2022-06-03 DIAGNOSIS — H53143 Visual discomfort, bilateral: Secondary | ICD-10-CM | POA: Diagnosis not present

## 2022-06-03 DIAGNOSIS — H524 Presbyopia: Secondary | ICD-10-CM | POA: Diagnosis not present

## 2022-06-03 DIAGNOSIS — E119 Type 2 diabetes mellitus without complications: Secondary | ICD-10-CM | POA: Diagnosis not present

## 2022-06-03 DIAGNOSIS — H25813 Combined forms of age-related cataract, bilateral: Secondary | ICD-10-CM | POA: Diagnosis not present

## 2022-06-03 DIAGNOSIS — I10 Essential (primary) hypertension: Secondary | ICD-10-CM | POA: Diagnosis not present

## 2022-06-03 DIAGNOSIS — H35033 Hypertensive retinopathy, bilateral: Secondary | ICD-10-CM | POA: Diagnosis not present

## 2022-06-03 DIAGNOSIS — H5203 Hypermetropia, bilateral: Secondary | ICD-10-CM | POA: Diagnosis not present

## 2022-06-03 DIAGNOSIS — Z0100 Encounter for examination of eyes and vision without abnormal findings: Secondary | ICD-10-CM | POA: Diagnosis not present

## 2022-06-03 DIAGNOSIS — D3132 Benign neoplasm of left choroid: Secondary | ICD-10-CM | POA: Diagnosis not present

## 2022-06-03 LAB — HM DIABETES EYE EXAM

## 2022-06-06 ENCOUNTER — Other Ambulatory Visit: Payer: Self-pay | Admitting: Family Medicine

## 2022-06-06 DIAGNOSIS — N1831 Chronic kidney disease, stage 3a: Secondary | ICD-10-CM

## 2022-06-06 DIAGNOSIS — E790 Hyperuricemia without signs of inflammatory arthritis and tophaceous disease: Secondary | ICD-10-CM

## 2022-06-06 DIAGNOSIS — E1169 Type 2 diabetes mellitus with other specified complication: Secondary | ICD-10-CM

## 2022-06-06 DIAGNOSIS — E559 Vitamin D deficiency, unspecified: Secondary | ICD-10-CM

## 2022-06-06 DIAGNOSIS — D508 Other iron deficiency anemias: Secondary | ICD-10-CM

## 2022-06-06 DIAGNOSIS — N138 Other obstructive and reflux uropathy: Secondary | ICD-10-CM

## 2022-06-08 ENCOUNTER — Ambulatory Visit (INDEPENDENT_AMBULATORY_CARE_PROVIDER_SITE_OTHER): Payer: Medicare HMO

## 2022-06-08 VITALS — Ht 67.0 in | Wt 209.0 lb

## 2022-06-08 DIAGNOSIS — Z0000 Encounter for general adult medical examination without abnormal findings: Secondary | ICD-10-CM

## 2022-06-08 NOTE — Progress Notes (Signed)
I connected with  Alan Morrison on 06/08/22 by a audio enabled telemedicine application and verified that I am speaking with the correct person using two identifiers.  Patient Location: Home  Provider Location: Home Office  I discussed the limitations of evaluation and management by telemedicine. The patient expressed understanding and agreed to proceed.  Subjective:   Alan Morrison is a 72 y.o. male who presents for Medicare Annual/Subsequent preventive examination.  Review of Systems     Cardiac Risk Factors include: advanced age (>60mn, >>62women);male gender;diabetes mellitus;hypertension;dyslipidemia     Objective:    Today's Vitals   06/08/22 0939  Weight: 209 lb (94.8 kg)  Height: 5' 7"$  (1.702 m)   Body mass index is 32.73 kg/m.     06/08/2022    9:55 AM 01/30/2022    4:00 PM 01/29/2022   10:33 AM 01/02/2022    8:04 PM 06/05/2021   10:30 AM 06/04/2020   10:31 AM 11/12/2019   12:25 PM  Advanced Directives  Does Patient Have a Medical Advance Directive? Yes Yes No Yes Yes Yes No  Type of AParamedicof [place]myr[place] will Living will  Living will;Healthcare Power of [place]Living will [place] will   Does patient want to make changes to medical advance directive?  No - Patient declined   Yes (MAU/Ambulatory/Procedural Areas - Information given)    Copy of [place] Chart? No - copy requested     No - copy requested   Would patient like information on creating a medical advance directive?       No - Patient declined    Current Medications (verified) Outpatient Encounter Medications as of 06/08/2022  Medication Sig   acetaminophen (TYLENOL) 500 MG tablet Take 1,000 mg by mouth 2 (two) times daily as needed for headache (pain).   aspirin EC 81 MG tablet Take 81 mg by mouth daily with supper. Swallow whole.   Calcium Carbonate Antacid (TUMS PO) Take 1 tablet by mouth  daily as needed (acid reflux/indigestion).   Cholecalciferol (VITAMIN D) 2000 units tablet Take 2,000 Units by mouth daily with breakfast.    finasteride (PROSCAR) 5 MG tablet TAKE 1 TABLET BY MOUTH EVERY DAY   Insulin Glargine (BASAGLAR KWIKPEN) 100 UNIT/ML Inject 100 Units into the skin at bedtime.   Insulin Pen Needle (B-D ULTRAFINE III SHORT PEN) 31G X 8 MM MISC 1 Device by Other route 4 (four) times daily. USE AS DIRECTED DAILY   Iron, Ferrous Sulfate, 325 (65 Fe) MG TABS Take 325 mg by mouth every other day.   losartan (COZAAR) 25 MG tablet TAKE 1 TABLET (25 MG TOTAL) BY MOUTH DAILY.   metFORMIN (GLUCOPHAGE) 1000 MG tablet Take 1 tablet (1,000 mg total) by mouth daily with breakfast. TAKE 1 TABLET BY MOUTH EVERY DAY AT BREAKFAST Strength: 1,000 mg (Patient taking differently: Take 1,000 mg by mouth 2 (two) times daily with a meal.)   metoprolol tartrate (LOPRESSOR) 25 MG tablet TAKE 0.5 TABLETS BY MOUTH 2 TIMES DAILY. (Patient taking differently: Take 12.5 mg by mouth 2 (two) times daily.)   MOUNJARO 10 MG/0.5ML Pen Inject into the skin.   Multiple Vitamin (MULTIVITAMIN WITH MINERALS) TABS tablet Take 1 tablet by mouth daily with breakfast.    nitroGLYCERIN (NITROSTAT) 0.4 MG SL tablet PLACE 1 TABLET UNDER THE TONGUE EVERY 5 MINUTES AS NEEDED FOR CHEST PAIN (Patient taking differently: Place 0.4 mg under the tongue every 5 (five) minutes  as needed for chest pain.)   ONETOUCH ULTRA test strip USE TO CHECK SUGARS DAILY  AS DIRECTED   probenecid (BENEMID) 500 MG tablet Take 0.5 tablets (250 mg total) by mouth every other day.   rivaroxaban (XARELTO) 10 MG TABS tablet Take 1 tablet (10 mg total) by mouth daily.   rosuvastatin (CRESTOR) 40 MG tablet Take 1 tablet (40 mg total) by mouth daily with supper.   tamsulosin (FLOMAX) 0.4 MG CAPS capsule Take 1 capsule (0.4 mg total) by mouth daily.   TRULICITY [id number] SOPN Inject 0.75 mg into the skin once a week.   No facility-administered  encounter medications on file as of 06/08/2022.    Allergies (verified) Patient has no known allergies.   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   by [place]   CAD (coronary artery disease) 2003   cath - Min Dz, EF 65%, Adm.R/O'd   Cervical spondylosis without myelopathy    Chest pain, atypical    Choroidal nevus 01/22/2011   left, yearly eye exam, no diabetic retinopathy   Diabetes mellitus type II    DVT of deep femoral vein, left ([place]) 04/29/2018   LLE DVT from proximal femoral vein to popliteal vein into proximal calf (04/29/2018) started on xarelto Heme (Magrinat) recommended continuing lifelong lower dose anticoagulant indefinitely (11/2018)   Dyspepsia    Ex-smoker    Fatty liver 02/2016   by [place]   GERD (gastroesophageal reflux disease)    Headache(784.0)    Heart murmur    Hx of   History of MRI of brain and brain stem 09/06   History of MRSA infection 12/2013   R knee boil   HLD (hyperlipidemia)    Hyperuricemia    Obesity    Sleep apnea    Stress-induced cardiomyopathy 09/27/98   WNL, EF 64%   Urosepsis 01/01-01/05/10   Hospitalization   Past Surgical History:  Procedure Laterality Date   CARDIAC CATHETERIZATION     COLONOSCOPY  07/2013   1 benign polyp rpt 10 yrs [name])   CORONARY THROMBECTOMY N/A 11/12/2019   Procedure: Coronary Thrombectomy;  Surgeon: Belva Crome, MD;  Location: [facility];  Service: Cardiovascular;  Laterality: N/A;   CORONARY/GRAFT ACUTE MI REVASCULARIZATION N/A 11/12/2019   Procedure: Coronary/Graft Acute MI Revascularization;  Surgeon: Belva Crome, MD;  Location: [facility];  Service: Cardiovascular;  Laterality: N/A;   INTRAVASCULAR ULTRASOUND/IVUS N/A 01/30/2022   Procedure: Intravascular  Ultrasound/IVUS;  Surgeon: Marty Heck, MD;  Location: [facility];  Service: Cardiovascular;  Laterality: N/A;  Venous   KNEE ARTHROSCOPY Left 1988   KNEE SURGERY Right 05/21/03   Right, Dr. Marlou Sa, med meniscus tear via MRI   LEFT HEART CATH AND CORONARY ANGIOGRAPHY N/A 04/01/2017   Procedure: LEFT HEART CATH AND CORONARY ANGIOGRAPHY;  Surgeon: Martinique, Peter M, MD;  Location: [facility];  Service: Cardiovascular;  Laterality: N/A;   LEFT HEART CATH AND CORONARY ANGIOGRAPHY N/A 11/12/2019   Procedure: LEFT HEART CATH AND CORONARY ANGIOGRAPHY;  Surgeon: Belva Crome, MD;  Location: [facility];  Service: Cardiovascular;  Laterality: N/A;   MYELOGRAM  08/05   bulging disc   PERIPHERAL VASCULAR BALLOON ANGIOPLASTY  01/30/2022  Procedure: PERIPHERAL VASCULAR BALLOON ANGIOPLASTY;  Surgeon: Marty Heck, MD;  Location: [facility];  Service: Cardiovascular;;   PERIPHERAL VASCULAR THROMBECTOMY Right 01/30/2022   Procedure: PERIPHERAL VASCULAR THROMBECTOMY;  Surgeon: Marty Heck, MD;  Location: [facility];  Service: Cardiovascular;  Laterality: Right;   Family History  Problem Relation Age of Onset   Heart disease Mother        CHF   Diabetes Mother    Heart disease Father        CHF   Hypertension Father    Pulmonary fibrosis Sister    Sjogren's syndrome Sister    Migraines Brother        severe headaches from arsenic in the past from  wood that was treated on his deck   CAD Brother 30       stent   Cancer Maternal Uncle        unsure   Stroke Neg Hx    Colon cancer Neg Hx    Social History   Socioeconomic History   Marital status: Married    Spouse name: Not on file   Number of children: 2   Years of education: 12   Highest education level: High school graduate  Occupational History   Occupation: Maintenance    Employer: [employer]  Tobacco Use   Smoking status: Former    Types: Cigarettes    Quit  date: 04/20/1974    Years since quitting: 48.1    Passive exposure: Never   Smokeless tobacco: Never   Tobacco comments:    quit over 20 years  Vaping Use   Vaping Use: Never used  Substance and Sexual Activity   Alcohol use: Yes    Alcohol/week: 3.0 standard drinks of alcohol    Types: 3 Cans of beer per week    Comment: occasional   Drug use: No   Sexual activity: Not on file  Other Topics Concern   Not on file  Social History Narrative   Lives with wife   Occupation: equipment maintenance   Activity: no regular exercise - hunts   Diet: good water, fruits/vegetables   Social Determinants of Health   Financial Resource Strain: Low Risk  (06/08/2022)   Overall Financial Resource Strain (CARDIA)    Difficulty of Paying Living Expenses: Not hard at all  Food Insecurity: No Food Insecurity (06/08/2022)   Hunger Vital Sign    Worried About Running Out of Food in the Last Year: Never true    Ran Out of Food in the Last Year: Never true  Transportation Needs: No Transportation Needs (06/08/2022)   PRAPARE - Hydrologist (Medical): No    Lack of Transportation (Non-Medical): No  Physical Activity: Insufficiently Active (06/08/2022)   Exercise Vital Sign    Days of Exercise per Week: 2 days    Minutes of Exercise per Session: 30 min  Stress: No Stress Concern Present (06/08/2022)   [place]    Feeling of Stress : Not at all  Social Connections: Moderately Integrated (06/08/2022)   Social Connection and Isolation Panel [NHANES]    Frequency of Communication with Friends and Family: Three times a week    Frequency of Social Gatherings with Friends and Family: Once a week    Attends Religious Services: Never    Marine scientist or Organizations: Yes    Attends Music therapist: More  than 4 times per year    Marital Status: Married  Recent Concern: Social Connections -  Moderately Isolated (06/08/2022)   Social Connection and Isolation Panel [NHANES]    Frequency of Communication with Friends and Family: Three times a week    Frequency of Social Gatherings with Friends and Family: Once a week    Attends Religious Services: Never    Marine scientist or Organizations: No    Attends Music therapist: Never    Marital Status: Married    Tobacco Counseling Counseling given: Not Answered Tobacco comments: quit over 20 years   Clinical Intake:  Pre-visit preparation completed: Yes  Pain : No/denies pain     Nutritional Risks: None Diabetes: Yes CBG done?: No (112 per pt) Did pt. bring in CBG monitor from home?: No  How often do you need to have someone help you when you read instructions, pamphlets, or other written materials from your doctor or pharmacy?: 1 - Never  Diabetic?Nutrition Risk Assessment:  Has the patient had any N/V/D within the last 2 months?  No  Does the patient have any non-healing wounds?  No  Has the patient had any unintentional weight loss or weight gain?  No   Diabetes:  Is the patient diabetic?  Yes  If diabetic, was a CBG obtained today?  Yes per pt 112 Did the patient bring in their glucometer from home?  No  How often do you monitor your CBG's? BID.   Financial Strains and Diabetes Management:  Are you having any financial strains with the device, your supplies or your medication? No .  Does the patient want to be seen by Chronic Care Management for management of their diabetes?  No  Would the patient like to be referred to a Nutritionist or for Diabetic Management?  No   Diabetic Exams:  Diabetic Eye Exam: Completed 06/03/2022 Diabetic Foot Exam: Completed by endocrinologist 10/14/2021    Interpreter Needed?: No  Information entered by :: C.Anarie Kalish LPN   Activities of Daily Living    06/08/2022    9:56 AM 06/04/2022    8:57 AM  In your present state of health, do you have any  difficulty performing the following activities:  Hearing? 0 0  Vision? 0 0  Difficulty concentrating or making decisions? 0 0  Walking or climbing stairs? 0 0  Dressing or bathing? 0 0  Doing errands, shopping? 0 0  Preparing Food and eating ? N N  Using the Toilet? N N  In the past six months, have you accidently leaked urine? N N  Do you have problems with loss of bowel control? N N  Managing your Medications? N N  Managing your Finances? N N  Housekeeping or managing your Housekeeping? N N    Patient Care Team: [name], MD as PCP - General (Family Medicine) Martinique, Peter M, MD as PCP - Cardiology (Cardiology) Martinique, Peter M, MD as Consulting Physician (Cardiology) Renato Shin, MD (Inactive) as Consulting Physician (Endocrinology) Magrinat, Virgie Dad, MD (Inactive) as Consulting Physician (Hematology and Oncology) Belva Crome, MD (Inactive) as Consulting Physician (Cardiology) Marica Otter, OD (Optometry)  Indicate any recent Medical Services you may have received from other than [facility] providers in the past year (date may be approximate).     Assessment:   This is a routine wellness examination for Alan Morrison.  Hearing/Vision screen Hearing Screening - Comments:: No aid Vision Screening - Comments:: Wears glasses - Dr.Miller last visit 06/03/2022  Dietary issues and exercise activities discussed: Current Exercise Habits: The patient does not participate in regular exercise at present, Exercise limited by: orthopedic condition(s) (bad knees)   Goals Addressed             This Visit's Progress    Patient Stated       Exercise more and lose a little weight.       Depression Screen    06/08/2022    9:51 AM 06/05/2021   10:33 AM 06/04/2020   10:33 AM 12/28/2019    3:05 PM 05/31/2019    1:21 PM 02/02/2019   11:46 AM 10/18/2017    2:22 PM  PHQ 2/9 Scores  PHQ - 2 Score 0 0 0 0 1 0 0  PHQ- 9 Score   0  6      Fall Risk    06/08/2022    9:43 AM 06/04/2022     8:57 AM 06/05/2021   10:31 AM 06/04/2020   10:32 AM 05/31/2019   12:33 PM  Fall Risk   Falls in the past year? 0 0 0 0 0  Number falls in past yr: 0 0 0 0   Injury with Fall? 0 0 0 0   Risk for fall due to : No Fall Risks  No Fall Risks Medication side effect   Follow up Falls prevention discussed;Falls evaluation completed  Falls prevention discussed Falls evaluation completed;Falls prevention discussed     FALL RISK PREVENTION PERTAINING TO THE HOME:  Any stairs in or around the home? Yes  If so, are there any without handrails? Yes  Home free of loose throw rugs in walkways, pet beds, electrical cords, etc? Yes  Adequate lighting in your home to reduce risk of falls? Yes   ASSISTIVE DEVICES UTILIZED TO PREVENT FALLS:  Life alert? No  Use of a cane, walker or w/c? No  Grab bars in the bathroom? Yes  Shower chair or bench in shower? No  Elevated toilet seat or a handicapped toilet? Yes   Cognitive Function:    06/04/2020   10:36 AM  MMSE - Mini Mental State Exam  Orientation to time 5  Orientation to Place 5  Registration 3  Attention/ Calculation 5  Recall 3  Language- repeat 1        06/08/2022   10:06 AM  6CIT Screen  What Year? 0 points  What month? 0 points  What time? 0 points  Count back from 20 0 points  Months in reverse 0 points  Repeat phrase 0 points  Total Score 0 points    Immunizations Immunization History  Administered Date(s) Administered   COVID-19, mRNA, vaccine(Comirnaty)12 years and older 01/24/2022   Influenza Whole 01/22/2009   Influenza, High Dose Seasonal PF 12/27/2017, 12/20/2018, 01/09/2020, 01/15/2021   Influenza, Seasonal, Injecte, Preservative Fre 01/18/2014   Influenza,inj,Quad PF,6+ Mos 01/20/2015   Influenza-Unspecified 01/18/2013, 01/18/2014, 01/30/2016, 01/06/2017   PFIZER Comirnaty(Gray Top)Covid-19 Tri-Sucrose Vaccine 07/31/2020   PFIZER(Purple Top)SARS-COV-2 Vaccination 05/28/2019, 06/22/2019, 01/09/2020   Pfizer  Covid-19 Vaccine Bivalent Booster 97yr & up 01/15/2021, 08/29/2021   Pneumococcal Conjugate-13 10/22/2016   Pneumococcal Polysaccharide-23 05/06/2011, 10/18/2017   Td 08/21/2004   Tdap 05/29/2014   Zoster Recombinat (Shingrix) 04/14/2017, 06/18/2017   Zoster, Live 08/23/2013    TDAP status: Up to date  Flu Vaccine status: Up to date [facility] [address]  Pneumococcal vaccine status: Up to date  Covid-19 vaccine status: Completed vaccines  Qualifies for Shingles Vaccine? No   Zostavax completed  Yes   Shingrix Completed?: Yes  Screening Tests Health Maintenance  Topic Date Due   OPHTHALMOLOGY EXAM  10/18/2020   HEMOGLOBIN A1C  04/15/2022   INFLUENZA VACCINE  07/19/2022 (Originally 11/18/2021)   Diabetic kidney evaluation - Urine ACR  10/15/2022   FOOT EXAM  10/15/2022   Diabetic kidney evaluation - eGFR measurement  02/04/2023   Medicare Annual Wellness (AWV)  06/09/2023   COLONOSCOPY (Pts 45-42yr Insurance coverage will need to be confirmed)  07/21/2023   DTaP/Tdap/Td (3 - Td or Tdap) 05/29/2024   Pneumonia Vaccine 72 Years old  Completed   COVID-19 Vaccine  Completed   Hepatitis C Screening  Completed   Zoster Vaccines- Shingrix  Completed   HPV VACCINES  Aged Out    Health Maintenance  Health Maintenance Due  Topic Date Due   OPHTHALMOLOGY EXAM  10/18/2020   HEMOGLOBIN A1C  04/15/2022    Colorectal cancer screening: Type of screening: Colonoscopy. Completed 07/20/13. Repeat every 10 years  Lung Cancer Screening: (Low Dose CT Chest recommended if Age 71-80years, 30 pack-year currently smoking OR have quit w/in 15years.) does not qualify.   Lung Cancer Screening Referral: no  Additional Screening:  Hepatitis C Screening: does not qualify; Completed 05/27/2015  Vision Screening: Recommended annual ophthalmology exams for early detection of glaucoma and other disorders of the eye. Is the patient up to date with their annual eye exam?  Yes  Who is the provider  or what is the name of the office in which the patient attends annual eye exams? Dr.Miller If pt is not established with a provider, would they like to be referred to a provider to establish care? No .   Dental Screening: Recommended annual dental exams for proper oral hygiene  Community Resource Referral / Chronic Care Management: CRR required this visit?  No   CCM required this visit?  No      Plan:     I have personally reviewed and noted the following in the patient's chart:   Medical and social history Use of alcohol, tobacco or illicit drugs  Current medications and supplements including opioid prescriptions. Patient is not currently taking opioid prescriptions. Functional ability and status Nutritional status Physical activity Advanced directives List of other physicians Hospitalizations, surgeries, and ER visits in previous 12 months Vitals Screenings to include cognitive, depression, and falls Referrals and appointments  In addition, I have reviewed and discussed with patient certain preventive protocols, quality metrics, and best practice recommendations. A written personalized care plan for preventive services as well as general preventive health recommendations were provided to patient.     CLebron Conners LPN   [id number]  Nurse Notes: none

## 2022-06-08 NOTE — Patient Instructions (Signed)
Alan Morrison , Thank you for taking time to come for your Medicare Wellness Visit. I appreciate your ongoing commitment to your health goals. Please review the following plan we discussed and let me know if I can assist you in the future.   These are the goals we discussed:  Goals      Patient Stated     06/04/2020, I will maintain and continue medications as prescribed.      Patient Stated     Would like to maintain current routine     Patient Stated     Exercise more and lose a little weight.        This is a list of the screening recommended for you and due dates:  Health Maintenance  Topic Date Due   Eye exam for diabetics  10/18/2020   Hemoglobin A1C  04/15/2022   Flu Shot  07/19/2022*   Yearly kidney health urinalysis for diabetes  10/15/2022   Complete foot exam   10/15/2022   Yearly kidney function blood test for diabetes  02/04/2023   Medicare Annual Wellness Visit  06/09/2023   Colon Cancer Screening  07/21/2023   DTaP/Tdap/Td vaccine (3 - Td or Tdap) 05/29/2024   Pneumonia Vaccine  Completed   COVID-19 Vaccine  Completed   Hepatitis C Screening: USPSTF Recommendation to screen - Ages 57-79 yo.  Completed   Zoster (Shingles) Vaccine  Completed   HPV Vaccine  Aged Out  *Topic was postponed. The date shown is not the original due date.    Advanced directives: Please bring a copy of your health care power of attorney and living will to the office to be added to your chart at your convenience.   Conditions/risks identified: Aim for 30 minutes of exercise or brisk walking, 6-8 glasses of water.  Next appointment: Follow up in one year for your annual wellness visit. 06/10/2023 @ 9:30 via telephone.  Preventive Care 8 Years and Older, Male  Preventive care refers to lifestyle choices and visits with your health care provider that can promote health and wellness. What does preventive care include? A yearly physical exam. This is also called an annual well  check. Dental exams once or twice a year. Routine eye exams. Ask your health care provider how often you should have your eyes checked. Personal lifestyle choices, including: Daily care of your teeth and gums. Regular physical activity. Eating a healthy diet. Avoiding tobacco and drug use. Limiting alcohol use. Practicing safe sex. Taking low doses of aspirin every day. Taking vitamin and mineral supplements as recommended by your health care provider. What happens during an annual well check? The services and screenings done by your health care provider during your annual well check will depend on your age, overall health, lifestyle risk factors, and family history of disease. Counseling  Your health care provider may ask you questions about your: Alcohol use. Tobacco use. Drug use. Emotional well-being. Home and relationship well-being. Sexual activity. Eating habits. History of falls. Memory and ability to understand (cognition). Work and work Statistician. Screening  You may have the following tests or measurements: Height, weight, and BMI. Blood pressure. Lipid and cholesterol levels. These may be checked every 5 years, or more frequently if you are over 87 years old. Skin check. Lung cancer screening. You may have this screening every year starting at age 56 if you have a 30-pack-year history of smoking and currently smoke or have quit within the past 15 years. Fecal occult blood test (FOBT)  of the stool. You may have this test every year starting at age 14. Flexible sigmoidoscopy or colonoscopy. You may have a sigmoidoscopy every 5 years or a colonoscopy every 10 years starting at age 64. Prostate cancer screening. Recommendations will vary depending on your family history and other risks. Hepatitis C blood test. Hepatitis B blood test. Sexually transmitted disease (STD) testing. Diabetes screening. This is done by checking your blood sugar (glucose) after you have not  eaten for a while (fasting). You may have this done every 1-3 years. Abdominal aortic aneurysm (AAA) screening. You may need this if you are a current or former smoker. Osteoporosis. You may be screened starting at age 23 if you are at high risk. Talk with your health care provider about your test results, treatment options, and if necessary, the need for more tests. Vaccines  Your health care provider may recommend certain vaccines, such as: Influenza vaccine. This is recommended every year. Tetanus, diphtheria, and acellular pertussis (Tdap, Td) vaccine. You may need a Td booster every 10 years. Zoster vaccine. You may need this after age 17. Pneumococcal 13-valent conjugate (PCV13) vaccine. One dose is recommended after age 60. Pneumococcal polysaccharide (PPSV23) vaccine. One dose is recommended after age 45. Talk to your health care provider about which screenings and vaccines you need and how often you need them. This information is not intended to replace advice given to you by your health care provider. Make sure you discuss any questions you have with your health care provider. Document Released: 05/03/2015 Document Revised: 12/25/2015 Document Reviewed: 02/05/2015 Elsevier Interactive Patient Education  2017 [place] Prevention in the Home Falls can cause injuries. They can happen to people of all ages. There are many things you can do to make your home safe and to help prevent falls. What can I do on the outside of my home? Regularly fix the edges of walkways and driveways and fix any cracks. Remove anything that might make you trip as you walk through a door, such as a raised step or threshold. Trim any bushes or trees on the path to your home. Use bright outdoor lighting. Clear any walking paths of anything that might make someone trip, such as rocks or tools. Regularly check to see if handrails are loose or broken. Make sure that both sides of any steps have  handrails. Any raised decks and porches should have guardrails on the edges. Have any leaves, snow, or ice cleared regularly. Use sand or salt on walking paths during winter. Clean up any spills in your garage right away. This includes oil or grease spills. What can I do in the bathroom? Use night lights. Install grab bars by the toilet and in the tub and shower. Do not use towel bars as grab bars. Use non-skid mats or decals in the tub or shower. If you need to sit down in the shower, use a plastic, non-slip stool. Keep the floor dry. Clean up any water that spills on the floor as soon as it happens. Remove soap buildup in the tub or shower regularly. Attach bath mats securely with double-sided non-slip rug tape. Do not have throw rugs and other things on the floor that can make you trip. What can I do in the bedroom? Use night lights. Make sure that you have a light by your bed that is easy to reach. Do not use any sheets or blankets that are too big for your bed. They should not hang down onto  the floor. Have a firm chair that has side arms. You can use this for support while you get dressed. Do not have throw rugs and other things on the floor that can make you trip. What can I do in the kitchen? Clean up any spills right away. Avoid walking on wet floors. Keep items that you use a lot in easy-to-reach places. If you need to reach something above you, use a strong step stool that has a grab bar. Keep electrical cords out of the way. Do not use floor polish or wax that makes floors slippery. If you must use wax, use non-skid floor wax. Do not have throw rugs and other things on the floor that can make you trip. What can I do with my stairs? Do not leave any items on the stairs. Make sure that there are handrails on both sides of the stairs and use them. Fix handrails that are broken or loose. Make sure that handrails are as long as the stairways. Check any carpeting to make sure  that it is firmly attached to the stairs. Fix any carpet that is loose or worn. Avoid having throw rugs at the top or bottom of the stairs. If you do have throw rugs, attach them to the floor with carpet tape. Make sure that you have a light switch at the top of the stairs and the bottom of the stairs. If you do not have them, ask someone to add them for you. What else can I do to help prevent falls? Wear shoes that: Do not have high heels. Have rubber bottoms. Are comfortable and fit you well. Are closed at the toe. Do not wear sandals. If you use a stepladder: Make sure that it is fully opened. Do not climb a closed stepladder. Make sure that both sides of the stepladder are locked into place. Ask someone to hold it for you, if possible. Clearly mark and make sure that you can see: Any grab bars or handrails. First and last steps. Where the edge of each step is. Use tools that help you move around (mobility aids) if they are needed. These include: Canes. Walkers. Scooters. Crutches. Turn on the lights when you go into a dark area. Replace any light bulbs as soon as they burn out. Set up your furniture so you have a clear path. Avoid moving your furniture around. If any of your floors are uneven, fix them. If there are any pets around you, be aware of where they are. Review your medicines with your doctor. Some medicines can make you feel dizzy. This can increase your chance of falling. Ask your doctor what other things that you can do to help prevent falls. This information is not intended to replace advice given to you by your health care provider. Make sure you discuss any questions you have with your health care provider. Document Released: 01/31/2009 Document Revised: 09/12/2015 Document Reviewed: 05/11/2014 Elsevier Interactive Patient Education  2017 [employer].

## 2022-06-10 ENCOUNTER — Other Ambulatory Visit (INDEPENDENT_AMBULATORY_CARE_PROVIDER_SITE_OTHER): Payer: Medicare HMO

## 2022-06-10 DIAGNOSIS — E785 Hyperlipidemia, unspecified: Secondary | ICD-10-CM

## 2022-06-10 DIAGNOSIS — D508 Other iron deficiency anemias: Secondary | ICD-10-CM | POA: Diagnosis not present

## 2022-06-10 DIAGNOSIS — N401 Enlarged prostate with lower urinary tract symptoms: Secondary | ICD-10-CM

## 2022-06-10 DIAGNOSIS — N1831 Chronic kidney disease, stage 3a: Secondary | ICD-10-CM

## 2022-06-10 DIAGNOSIS — Z794 Long term (current) use of insulin: Secondary | ICD-10-CM

## 2022-06-10 DIAGNOSIS — E790 Hyperuricemia without signs of inflammatory arthritis and tophaceous disease: Secondary | ICD-10-CM | POA: Diagnosis not present

## 2022-06-10 DIAGNOSIS — N138 Other obstructive and reflux uropathy: Secondary | ICD-10-CM

## 2022-06-10 DIAGNOSIS — E1122 Type 2 diabetes mellitus with diabetic chronic kidney disease: Secondary | ICD-10-CM

## 2022-06-10 DIAGNOSIS — E1169 Type 2 diabetes mellitus with other specified complication: Secondary | ICD-10-CM

## 2022-06-10 DIAGNOSIS — E559 Vitamin D deficiency, unspecified: Secondary | ICD-10-CM

## 2022-06-10 LAB — COMPREHENSIVE METABOLIC PANEL
ALT: 35 U/L (ref 0–53)
AST: 30 U/L (ref 0–37)
Albumin: 4.3 g/dL (ref 3.5–5.2)
Alkaline Phosphatase: 40 U/L (ref 39–117)
BUN: 36 mg/dL — ABNORMAL HIGH (ref 6–23)
CO2: 25 mEq/L (ref 19–32)
Calcium: 10.3 mg/dL (ref 8.4–10.5)
Chloride: 109 mEq/L (ref 96–112)
Creatinine, Ser: 1.53 mg/dL — ABNORMAL HIGH (ref 0.40–1.50)
GFR: 45.62 mL/min — ABNORMAL LOW (ref >60.00)
Glucose, Bld: 93 mg/dL (ref 70–99)
Potassium: 5.1 mEq/L (ref 3.5–5.1)
Sodium: 145 mEq/L (ref 135–145)
Total Bilirubin: 0.5 mg/dL (ref 0.2–1.2)
Total Protein: 6.8 g/dL (ref 6.0–8.3)

## 2022-06-10 LAB — LIPID PANEL
Cholesterol: 100 mg/dL (ref 0–200)
HDL: 31.4 mg/dL — ABNORMAL LOW (ref >39.00)
LDL Cholesterol: 48 mg/dL (ref 0–99)
NonHDL: 68.84
Total CHOL/HDL Ratio: 3
Triglycerides: 102 mg/dL (ref 0.0–149.0)
VLDL: 20.4 mg/dL (ref 0.0–40.0)

## 2022-06-10 LAB — IBC PANEL
Iron: 69 ug/dL (ref 42–165)
Saturation Ratios: 16.8 % — ABNORMAL LOW (ref 20.0–50.0)
TIBC: 411.6 ug/dL (ref 250.0–450.0)
Transferrin: 294 mg/dL (ref 212.0–360.0)

## 2022-06-10 LAB — CBC WITH DIFFERENTIAL/PLATELET
Basophils Absolute: 0 10*3/uL (ref 0.0–0.1)
Basophils Relative: 0.7 % (ref 0.0–3.0)
Eosinophils Absolute: 0.2 10*3/uL (ref 0.0–0.7)
Eosinophils Relative: 2.4 % (ref 0.0–5.0)
HCT: 45.2 % (ref 39.0–52.0)
Hemoglobin: 15.3 g/dL (ref 13.0–17.0)
Lymphocytes Relative: 23.4 % (ref 12.0–46.0)
Lymphs Abs: 1.6 10*3/uL (ref 0.7–4.0)
MCHC: 34 g/dL (ref 30.0–36.0)
MCV: 87.4 fl (ref 78.0–100.0)
Monocytes Absolute: 0.4 10*3/uL (ref 0.1–1.0)
Monocytes Relative: 5.9 % (ref 3.0–12.0)
Neutro Abs: 4.5 10*3/uL (ref 1.4–7.7)
Neutrophils Relative %: 67.6 % (ref 43.0–77.0)
Platelets: 228 10*3/uL (ref 150.0–400.0)
RBC: 5.17 Mil/uL (ref 4.22–5.81)
RDW: 14.3 % (ref 11.5–15.5)
WBC: 6.7 10*3/uL (ref 4.0–10.5)

## 2022-06-10 LAB — HEMOGLOBIN A1C: Hgb A1c MFr Bld: 8.3 % — ABNORMAL HIGH (ref 4.6–6.5)

## 2022-06-10 LAB — VITAMIN D 25 HYDROXY (VIT D DEFICIENCY, FRACTURES): VITD: 46.37 ng/mL (ref 30.00–100.00)

## 2022-06-10 LAB — URIC ACID: Uric Acid, Serum: 7 mg/dL (ref 4.0–7.8)

## 2022-06-10 LAB — PHOSPHORUS: Phosphorus: 3.8 mg/dL (ref 2.3–4.6)

## 2022-06-10 LAB — PSA: PSA: 0.47 ng/mL (ref 0.10–4.00)

## 2022-06-10 LAB — FERRITIN: Ferritin: 30.1 ng/mL (ref 22.0–322.0)

## 2022-06-11 LAB — MICROALBUMIN / CREATININE URINE RATIO
Creatinine,U: 237.4 mg/dL
Microalb Creat Ratio: 3.1 mg/g (ref 0.0–30.0)
Microalb, Ur: 7.4 mg/dL — ABNORMAL HIGH (ref 0.0–1.9)

## 2022-06-11 LAB — PARATHYROID HORMONE, INTACT (NO CA): PTH: 26 pg/mL (ref 16–77)

## 2022-06-17 ENCOUNTER — Ambulatory Visit (INDEPENDENT_AMBULATORY_CARE_PROVIDER_SITE_OTHER): Payer: Medicare HMO | Admitting: Family Medicine

## 2022-06-17 ENCOUNTER — Encounter: Payer: Self-pay | Admitting: Family Medicine

## 2022-06-17 VITALS — BP 122/78 | HR 95 | Temp 97.6°F | Ht 66.5 in | Wt 197.0 lb

## 2022-06-17 DIAGNOSIS — Z87891 Personal history of nicotine dependence: Secondary | ICD-10-CM | POA: Diagnosis not present

## 2022-06-17 DIAGNOSIS — D508 Other iron deficiency anemias: Secondary | ICD-10-CM | POA: Diagnosis not present

## 2022-06-17 DIAGNOSIS — K219 Gastro-esophageal reflux disease without esophagitis: Secondary | ICD-10-CM

## 2022-06-17 DIAGNOSIS — K760 Fatty (change of) liver, not elsewhere classified: Secondary | ICD-10-CM | POA: Diagnosis not present

## 2022-06-17 DIAGNOSIS — Z86718 Personal history of other venous thrombosis and embolism: Secondary | ICD-10-CM

## 2022-06-17 DIAGNOSIS — I2510 Atherosclerotic heart disease of native coronary artery without angina pectoris: Secondary | ICD-10-CM | POA: Diagnosis not present

## 2022-06-17 DIAGNOSIS — N401 Enlarged prostate with lower urinary tract symptoms: Secondary | ICD-10-CM

## 2022-06-17 DIAGNOSIS — N138 Other obstructive and reflux uropathy: Secondary | ICD-10-CM

## 2022-06-17 DIAGNOSIS — E1122 Type 2 diabetes mellitus with diabetic chronic kidney disease: Secondary | ICD-10-CM | POA: Diagnosis not present

## 2022-06-17 DIAGNOSIS — D3502 Benign neoplasm of left adrenal gland: Secondary | ICD-10-CM | POA: Diagnosis not present

## 2022-06-17 DIAGNOSIS — N1831 Chronic kidney disease, stage 3a: Secondary | ICD-10-CM

## 2022-06-17 DIAGNOSIS — Z0000 Encounter for general adult medical examination without abnormal findings: Secondary | ICD-10-CM | POA: Diagnosis not present

## 2022-06-17 DIAGNOSIS — E1169 Type 2 diabetes mellitus with other specified complication: Secondary | ICD-10-CM

## 2022-06-17 DIAGNOSIS — R911 Solitary pulmonary nodule: Secondary | ICD-10-CM

## 2022-06-17 DIAGNOSIS — E785 Hyperlipidemia, unspecified: Secondary | ICD-10-CM

## 2022-06-17 DIAGNOSIS — Z7189 Other specified counseling: Secondary | ICD-10-CM | POA: Diagnosis not present

## 2022-06-17 DIAGNOSIS — E559 Vitamin D deficiency, unspecified: Secondary | ICD-10-CM

## 2022-06-17 DIAGNOSIS — Z794 Long term (current) use of insulin: Secondary | ICD-10-CM

## 2022-06-17 DIAGNOSIS — E790 Hyperuricemia without signs of inflammatory arthritis and tophaceous disease: Secondary | ICD-10-CM

## 2022-06-17 DIAGNOSIS — I10 Essential (primary) hypertension: Secondary | ICD-10-CM | POA: Diagnosis not present

## 2022-06-17 DIAGNOSIS — E669 Obesity, unspecified: Secondary | ICD-10-CM

## 2022-06-17 MED ORDER — TAMSULOSIN HCL 0.4 MG PO CAPS: 0.4000 mg | ORAL_CAPSULE | Freq: Every day | ORAL | 4 refills | Status: DC

## 2022-06-17 MED ORDER — FINASTERIDE 5 MG PO TABS: 5.0000 mg | ORAL_TABLET | Freq: Every day | ORAL | 4 refills | Status: DC

## 2022-06-17 MED ORDER — PROBENECID 500 MG PO TABS: 250.0000 mg | ORAL_TABLET | ORAL | 2 refills | Status: DC

## 2022-06-17 MED ORDER — RIVAROXABAN 10 MG PO TABS: 10.0000 mg | ORAL_TABLET | Freq: Every day | ORAL | 11 refills | Status: DC

## 2022-06-17 NOTE — Assessment & Plan Note (Signed)
Chronic, stable. Continue crestor '40mg'$  daily The ASCVD Risk score (Arnett DK, et al., 2019) failed to calculate for the following reasons:   The patient has a prior MI or stroke diagnosis

## 2022-06-17 NOTE — Assessment & Plan Note (Deleted)
Reviewed Cr trend over the past 7 years with progression from 0.9 to 1.5.  Anticipate diabetic nephropathy, encouraged good fluid intake and glycemic control.

## 2022-06-17 NOTE — Assessment & Plan Note (Addendum)
Anemia has resolved iron panel ok but stores remain low - continue oral iron QOD

## 2022-06-17 NOTE — Assessment & Plan Note (Signed)
Remote smoker.

## 2022-06-17 NOTE — Assessment & Plan Note (Signed)
Previously discussed - we have requested copy.

## 2022-06-17 NOTE — Assessment & Plan Note (Addendum)
H/o gout, no recent flare. Only managed on low dose probenecid.  Discussed possible future allopurinol as urate levels trending up, but given no recent flare he prefers to continue current regimen.

## 2022-06-17 NOTE — Assessment & Plan Note (Addendum)
Chronic, stable on current regimen - continue. 

## 2022-06-17 NOTE — Progress Notes (Signed)
Patient ID: Alan Morrison, male    DOB: 1950/11/02, 72 y.o.   MRN: [id number]  This visit was conducted in person.  BP 122/78   Pulse 95   Temp 97.6 F (36.4 C) (Temporal)   Ht 5' 6.5" (1.689 m)   Wt 197 lb (89.4 kg)   SpO2 97%   BMI 31.32 kg/m    CC: CPE Subjective:   HPI: Alan Morrison is a 72 y.o. male presenting on 06/17/2022 for Annual Exam (MCR prt 2 [AWV- 06/08/22]. Requests refill of Benemid sent to [facility] [address]. Also, wants to discuss Xarelto rx. )   Saw health advisor last week for medicare wellness visit. Note reviewed.   No results found.  [place] Office Visit from 06/17/2022 in [place] at [place]  PHQ-2 Total Score 0          06/17/2022   10:56 AM 06/08/2022    9:43 AM 06/04/2022    8:57 AM 06/05/2021   10:31 AM 06/04/2020   10:32 AM  Fall Risk   Falls in the past year? 0 0 0 0 0  Number falls in past yr:  0 0 0 0  Injury with Fall?  0 0 0 0  Risk for fall due to :  No Fall Risks  No Fall Risks Medication side effect  Follow up  Falls prevention discussed;Falls evaluation completed  Falls prevention discussed Falls evaluation completed;Falls prevention discussed    MI (inferior wall STEMI) 10/2019 s/p PCI with DES to RCA - treated with DAPT aspirin/plavix x1 yr total.    H/o L femoral DVT 04/2018 s/p xarelto treatment through 11/2019.   Hospitalized 01/2022 with R leg swelling found to have acute occlusive DVT of external iliac, common femoral, sup femoral, deep femoral veins s/p percutaneous mechanical venous thrombectomy of R popliteal, superficial femoral, common femoral, iliac vein and distal IVC with angioplasty of R iliac vein and distal IVC. He was also found to have acute PE with moderate clot burden, largely asymptomatic - started on xarelto '20mg'$  daily indefinitely.  Other incidental findings: 1.4 cm indeterminate left adrenal nodule-rec outpatient follow-up, repeat CT scan in 1 year. Saw endo with reassuring  hormonal eval 02/2022 (DHEA low, cortisol, metanephrines, aldo/renin WNL) - rec rpt 07/2022.  5.3 mm right lower lobe pulmonary nodule-rec outpatient follow-up. No fmhx lung cancer. Remote minimal smoking history.   Saw [facility] in follow up - found to have RLE venous reflux. Rec lifelong xarelto at '10mg'$  preventative dosing.   DM - established with [facility] endocrinology (Dr Gabriel Carina) - on metformin '1000mg'$  daily, basaglar [id number] daily, trulicity --> Mounjaro '10mg'$  weekly. Monitors sugars BID with one touch glucometer.   Preventative: COLONOSCOPY Date: 07/2013 1 benign polyp rpt 10 yrs [name])  Prostate - yearly PSA, stable period on flomax and finasteride.  Lung cancer screening - not eligible  AAA screen - no fmhx - not eligible  Flu shot yearly at work  [place] 2/201, 06/2019, booster 12/2019, 07/2020, bivalent 12/2020, 08/2021, 01/2022.  Pneumovax 2013, 2019, prevnar-13 2018.  Tdap 2016  RSV - done at [facility] - will send [place] dates Zostavax - 08/2013  Shingrix - 03/2017, 06/2017  Advanced planning discussion. Has at home. Wife is HCPOA. Does not want prolonged life support if terminal condition. We have asked him to bring [place] a copy. Seat belt use discussed.  Sunscreen use discussed. No changing moles.  Ex smoker - quit remotely - 1972 - 4 PY hx  Alcohol - occasional beer  Dentist - Q27mo Eye exam - yearly  Bowel - no constipation Bladder - no incontinence   Lives with wife  Occupation: equipment maintenance  Activity: [facility] [employer]  Diet: good water, daily fruits/vegetables, diet drinks      Relevant past medical, surgical, family and social history reviewed and updated as indicated. Interim medical history since our last visit reviewed. Allergies and medications reviewed and updated. Outpatient Medications Prior to Visit  Medication Sig Dispense Refill   acetaminophen (TYLENOL) 500 MG tablet Take 1,000 mg by mouth 2 (two) times daily as needed for headache (pain).      aspirin EC 81 MG tablet Take 81 mg by mouth daily with supper. Swallow whole.     Calcium Carbonate Antacid (TUMS PO) Take 1 tablet by mouth daily as needed (acid reflux/indigestion).     Cholecalciferol (VITAMIN D) 2000 units tablet Take 2,000 Units by mouth daily with breakfast.      Insulin Glargine (BASAGLAR KWIKPEN) 100 UNIT/ML Inject 100 Units into the skin at bedtime.     Insulin Pen Needle (B-D ULTRAFINE III SHORT PEN) 31G X 8 MM MISC 1 Device by Other route 4 (four) times daily. USE AS DIRECTED DAILY 120 each 11   Iron, Ferrous Sulfate, 325 (65 Fe) MG TABS Take 325 mg by mouth every other day.     losartan (COZAAR) 25 MG tablet TAKE 1 TABLET (25 MG TOTAL) BY MOUTH DAILY. 90 tablet 1   metoprolol tartrate (LOPRESSOR) 25 MG tablet TAKE 0.5 TABLETS BY MOUTH 2 TIMES DAILY. (Patient taking differently: Take 12.5 mg by mouth 2 (two) times daily.) 90 tablet 2   MOUNJARO 10 MG/0.5ML Pen Inject into the skin.     Multiple Vitamin (MULTIVITAMIN WITH MINERALS) TABS tablet Take 1 tablet by mouth daily with breakfast.      nitroGLYCERIN (NITROSTAT) 0.4 MG SL tablet PLACE 1 TABLET UNDER THE TONGUE EVERY 5 MINUTES AS NEEDED FOR CHEST PAIN (Patient taking differently: Place 0.4 mg under the tongue every 5 (five) minutes as needed for chest pain.) 25 tablet 11   ONETOUCH ULTRA test strip USE TO CHECK SUGARS DAILY  AS DIRECTED 100 strip 3   rosuvastatin (CRESTOR) 40 MG tablet Take 1 tablet (40 mg total) by mouth daily with supper. 90 tablet 2   finasteride (PROSCAR) 5 MG tablet TAKE 1 TABLET BY MOUTH EVERY DAY 90 tablet 0   metFORMIN (GLUCOPHAGE) 1000 MG tablet Take 1 tablet (1,000 mg total) by mouth daily with breakfast. TAKE 1 TABLET BY MOUTH EVERY DAY AT BREAKFAST Strength: 1,000 mg (Patient taking differently: Take 1,000 mg by mouth 2 (two) times daily with a meal.) 90 tablet 3   probenecid (BENEMID) 500 MG tablet Take 0.5 tablets (250 mg total) by mouth every other day. 45 tablet 3   rivaroxaban  (XARELTO) 10 MG TABS tablet Take 1 tablet (10 mg total) by mouth daily. 30 tablet 3   tamsulosin (FLOMAX) 0.4 MG CAPS capsule Take 1 capsule (0.4 mg total) by mouth daily. 90 capsule 3   metFORMIN (GLUCOPHAGE) 1000 MG tablet Take 1 tablet (1,000 mg total) by mouth 2 (two) times daily with a meal.     TRULICITY [id number] Inject 0.75 mg into the skin once a week.     No facility-administered medications prior to visit.     Per HPI unless specifically indicated in ROS section below Review of Systems  Constitutional:  Negative for activity change,  appetite change, chills, fatigue, fever and unexpected weight change.  HENT:  Negative for hearing loss.   Eyes:  Negative for visual disturbance.  Respiratory:  Negative for cough, chest tightness, shortness of breath and wheezing.   Cardiovascular:  Negative for chest pain, palpitations and leg swelling.  Gastrointestinal:  Negative for abdominal distention, abdominal pain, blood in stool, constipation, diarrhea, nausea and vomiting.  Genitourinary:  Negative for difficulty urinating and hematuria.  Musculoskeletal:  Negative for arthralgias, myalgias and neck pain.  Skin:  Negative for rash.  Neurological:  Negative for dizziness, seizures, syncope and headaches.  Hematological:  Negative for adenopathy. Does not bruise/bleed easily.  Psychiatric/Behavioral:  Negative for dysphoric mood. The patient is not nervous/anxious.     Objective:  BP 122/78   Pulse 95   Temp 97.6 F (36.4 C) (Temporal)   Ht 5' 6.5" (1.689 m)   Wt 197 lb (89.4 kg)   SpO2 97%   BMI 31.32 kg/m   Wt Readings from Last 3 Encounters:  06/17/22 197 lb (89.4 kg)  06/08/22 209 lb (94.8 kg)  05/11/22 208 lb 12.8 oz (94.7 kg)      Physical Exam Vitals and nursing note reviewed.  Constitutional:      General: He is not in acute distress.    Appearance: Normal appearance. He is well-developed. He is not ill-appearing.  HENT:     Head: Normocephalic and  atraumatic.     Right Ear: Hearing, tympanic membrane, ear canal and external ear normal.     Left Ear: Hearing, tympanic membrane, ear canal and external ear normal.     Mouth/Throat:     Comments: Wearing mask Eyes:     General: No scleral icterus.    Extraocular Movements: Extraocular movements intact.     Conjunctiva/sclera: Conjunctivae normal.     Pupils: Pupils are equal, round, and reactive to light.  Neck:     Thyroid: No thyroid mass or thyromegaly.     Vascular: No carotid bruit.  Cardiovascular:     Rate and Rhythm: Normal rate and regular rhythm.     Pulses: Normal pulses.          Radial pulses are 2+ on the right side and 2+ on the left side.     Heart sounds: Normal heart sounds. No murmur heard. Pulmonary:     Effort: Pulmonary effort is normal. No respiratory distress.     Breath sounds: Normal breath sounds. No wheezing, rhonchi or rales.  Abdominal:     General: Bowel sounds are normal. There is no distension.     Palpations: Abdomen is soft. There is no mass.     Tenderness: There is no abdominal tenderness. There is no guarding or rebound.     Hernia: No hernia is present.  Musculoskeletal:        General: Normal range of motion.     Cervical back: Normal range of motion and neck supple.     Right lower leg: No edema.     Left lower leg: No edema.  Lymphadenopathy:     Cervical: No cervical adenopathy.  Skin:    General: Skin is warm and dry.     Findings: No rash.  Neurological:     General: No focal deficit present.     Mental Status: He is alert and oriented to person, place, and time.  Psychiatric:        Mood and Affect: Mood normal.        Behavior:  Behavior normal.        Thought Content: Thought content normal.        Judgment: Judgment normal.       Results for orders placed or performed in visit on 06/10/22  Uric acid  Result Value Ref Range   Uric Acid, Serum 7.0 4.0 - 7.8 mg/dL  IBC panel  Result Value Ref Range   Iron 69 42 - 165  ug/dL   Transferrin 294.0 212.0 - 360.0 mg/dL   Saturation Ratios 16.8 (L) 20.0 - 50.0 %   TIBC 411.6 250.0 - 450.0 mcg/dL  Ferritin  Result Value Ref Range   Ferritin 30.1 22.0 - 322.0 ng/mL  PSA  Result Value Ref Range   PSA 0.47 0.10 - 4.00 ng/mL  CBC with Differential/Platelet  Result Value Ref Range   WBC 6.7 4.0 - 10.5 K/uL   RBC 5.17 4.22 - 5.81 Mil/uL   Hemoglobin 15.3 13.0 - 17.0 g/dL   HCT 45.2 39.0 - 52.0 %   MCV 87.4 78.0 - 100.0 fl   MCHC 34.0 30.0 - 36.0 g/dL   RDW 14.3 11.5 - 15.5 %   Platelets 228.0 150.0 - 400.0 K/uL   Neutrophils Relative % 67.6 43.0 - 77.0 %   Lymphocytes Relative 23.4 12.0 - 46.0 %   Monocytes Relative 5.9 3.0 - 12.0 %   Eosinophils Relative 2.4 0.0 - 5.0 %   Basophils Relative 0.7 0.0 - 3.0 %   Neutro Abs 4.5 1.4 - 7.7 K/uL   Lymphs Abs 1.6 0.7 - 4.0 K/uL   Monocytes Absolute 0.4 0.1 - 1.0 K/uL   Eosinophils Absolute 0.2 0.0 - 0.7 K/uL   Basophils Absolute 0.0 0.0 - 0.1 K/uL  Parathyroid hormone, intact (no Ca)  Result Value Ref Range   PTH 26 16 - 77 pg/mL  Microalbumin / creatinine urine ratio  Result Value Ref Range   Microalb, Ur 7.4 (H) 0.0 - 1.9 mg/dL   Creatinine,U 237.4 mg/dL   Microalb Creat Ratio 3.1 0.0 - 30.0 mg/g  VITAMIN D 25 Hydroxy (Vit-D Deficiency, Fractures)  Result Value Ref Range   VITD 46.37 30.00 - 100.00 ng/mL  Phosphorus  Result Value Ref Range   Phosphorus 3.8 2.3 - 4.6 mg/dL  Comprehensive metabolic panel  Result Value Ref Range   Sodium 145 135 - 145 mEq/L   Potassium 5.1 3.5 - 5.1 mEq/L   Chloride 109 96 - 112 mEq/L   CO2 25 19 - 32 mEq/L   Glucose, Bld 93 70 - 99 mg/dL   BUN 36 (H) 6 - 23 mg/dL   Creatinine, Ser 1.53 (H) 0.40 - 1.50 mg/dL   Total Bilirubin 0.5 0.2 - 1.2 mg/dL   Alkaline Phosphatase 40 39 - 117 U/L   AST 30 0 - 37 U/L   ALT 35 0 - 53 U/L   Total Protein 6.8 6.0 - 8.3 g/dL   Albumin 4.3 3.5 - 5.2 g/dL   GFR 45.62 (L) >60.00 mL/min   Calcium 10.3 8.4 - 10.5 mg/dL  Lipid panel   Result Value Ref Range   Cholesterol 100 0 - 200 mg/dL   Triglycerides 102.0 0.0 - 149.0 mg/dL   HDL 31.40 (L) >39.00 mg/dL   VLDL 20.4 0.0 - 40.0 mg/dL   LDL Cholesterol 48 0 - 99 mg/dL   Total CHOL/HDL Ratio 3    NonHDL 68.84   Hemoglobin A1c  Result Value Ref Range   Hgb A1c MFr Bld 8.3 (H) 4.6 -  6.5 %    Assessment & Plan:   Problem List Items Addressed This Visit     Health maintenance examination - Primary (Chronic)    Preventative protocols reviewed and updated unless pt declined. Discussed healthy diet and lifestyle.       Advanced directives, counseling/discussion (Chronic)    Previously discussed - we have requested copy.       Type 2 diabetes mellitus with chronic kidney disease, with long-term current use of insulin ([place])    DM followed by endocrinology, notes improvement in A1c.  Reviewed Cr trend over the past 7 years with progression from 0.9 to 1.5.  Anticipate diabetic nephropathy, encouraged good fluid intake and glycemic control.      Relevant Medications   metFORMIN (GLUCOPHAGE) 1000 MG tablet   Coronary artery disease    Appreciate cardiology care.       Relevant Medications   rivaroxaban (XARELTO) 10 MG TABS tablet   Benign prostatic hyperplasia with urinary obstruction    Stable period on flomax and finasteride, PSA low.       Relevant Medications   finasteride (PROSCAR) 5 MG tablet   tamsulosin (FLOMAX) 0.4 MG CAPS capsule   Essential hypertension, benign    Chronic, stable on current regimen - continue.      Relevant Medications   rivaroxaban (XARELTO) 10 MG TABS tablet   Obesity, Class I, BMI 30-34.9    Continue to encourage healthy diet and lifestyle choices for sustainable weight loss. Congratulated on weight loss to date on mounjaro.       Vitamin D deficiency    Continue vit D 2000 IU daily.       Hyperlipidemia associated with type 2 diabetes mellitus (HCC)    Chronic, stable. Continue crestor '40mg'$  daily The ASCVD Risk  score (Arnett DK, et al., 2019) failed to calculate for the following reasons:   The patient has a prior MI or stroke diagnosis       Relevant Medications   metFORMIN (GLUCOPHAGE) 1000 MG tablet   rivaroxaban (XARELTO) 10 MG TABS tablet   Ex-smoker    Remote smoker.       Hyperuricemia    H/o gout, no recent flare. Only managed on low dose probenecid.  Discussed possible future allopurinol as urate levels trending up, but given no recent flare he prefers to continue current regimen.       Fatty liver   GERD (gastroesophageal reflux disease)    Stable period off medication.       Anemia, iron deficiency, inadequate dietary intake    Anemia has resolved iron panel ok but stores remain low - continue oral iron QOD      History of recurrent deep vein thrombosis (DVT)    H/o bilateral DVTs, most recent extensive RLE DVT with PE. Will continue lifelong xarelto '10mg'$  preventatively.       Adrenal adenoma, left    This is now followed by endo with planned rpt imaging 07/2022. Had reassuring hormonal eval by endo.       Right lower lobe pulmonary nodule    Small nodule. Remote ex smoker. No fmhx.  Low risk for lung cancer.  Consider rpt 1 yr vs no repeat.         Meds ordered this encounter  Medications   finasteride (PROSCAR) 5 MG tablet    Sig: Take 1 tablet (5 mg total) by mouth daily.    Dispense:  90 tablet    Refill:  4  tamsulosin (FLOMAX) 0.4 MG CAPS capsule    Sig: Take 1 capsule (0.4 mg total) by mouth daily.    Dispense:  90 capsule    Refill:  4   rivaroxaban (XARELTO) 10 MG TABS tablet    Sig: Take 1 tablet (10 mg total) by mouth daily.    Dispense:  30 tablet    Refill:  11   probenecid (BENEMID) 500 MG tablet    Sig: Take 0.5 tablets (250 mg total) by mouth every other day.    Dispense:  45 tablet    Refill:  2    No orders of the defined types were placed in this encounter.   Patient Instructions  Check on dates of RSV shot and let us know to  update your chart You are doing well today.  Ensure good hydration for kidneys Good to see you today Return as needed or in 6 months for follow up visit   Follow up plan: Return in about 6 months (around 12/16/2022) for follow up visit.  [name], MD

## 2022-06-17 NOTE — Assessment & Plan Note (Signed)
Preventative protocols reviewed and updated unless pt declined. Discussed healthy diet and lifestyle.  

## 2022-06-17 NOTE — Assessment & Plan Note (Signed)
Stable period on flomax and finasteride, PSA low.

## 2022-06-17 NOTE — Assessment & Plan Note (Signed)
Small nodule. Remote ex smoker. No fmhx.  Low risk for lung cancer.  Consider rpt 1 yr vs no repeat.

## 2022-06-17 NOTE — Assessment & Plan Note (Signed)
H/o bilateral DVTs, most recent extensive RLE DVT with PE. Will continue lifelong xarelto '10mg'$  preventatively.

## 2022-06-17 NOTE — Assessment & Plan Note (Addendum)
This is now followed by endo with planned rpt imaging 07/2022. Had reassuring hormonal eval by endo.

## 2022-06-17 NOTE — Assessment & Plan Note (Signed)
Continue to encourage healthy diet and lifestyle choices for sustainable weight loss. Congratulated on weight loss to date on mounjaro.

## 2022-06-17 NOTE — Assessment & Plan Note (Signed)
Appreciate cardiology care.  °

## 2022-06-17 NOTE — Assessment & Plan Note (Signed)
Continue vit D 2000 IU daily.

## 2022-06-17 NOTE — Assessment & Plan Note (Signed)
Stable period off medication.

## 2022-06-17 NOTE — Patient Instructions (Addendum)
Check on dates of RSV shot and let us know to update your chart You are doing well today.  Ensure good hydration for kidneys Good to see you today Return as needed or in 6 months for follow up visit

## 2022-06-17 NOTE — Assessment & Plan Note (Signed)
DM followed by endocrinology, notes improvement in A1c.  Reviewed Cr trend over the past 7 years with progression from 0.9 to 1.5.  Anticipate diabetic nephropathy, encouraged good fluid intake and glycemic control.

## 2022-06-28 ENCOUNTER — Encounter: Payer: Self-pay | Admitting: Family Medicine

## 2022-06-29 NOTE — Telephone Encounter (Signed)
Updated pt's chart.  

## 2022-07-06 ENCOUNTER — Other Ambulatory Visit: Payer: Self-pay

## 2022-07-06 MED ORDER — MOUNJARO 10 MG/0.5ML ~~LOC~~ SOAJ
10.0000 mg | SUBCUTANEOUS | 4 refills | Status: DC
  Filled 2022-07-06: qty 2, 28d supply, fill #0
  Filled 2022-07-24: qty 2, 28d supply, fill #1
  Filled 2022-08-28: qty 2, 28d supply, fill #2
  Filled 2022-09-28: qty 2, 28d supply, fill #3

## 2022-07-07 ENCOUNTER — Other Ambulatory Visit: Payer: Self-pay | Admitting: Internal Medicine

## 2022-07-07 DIAGNOSIS — Z794 Long term (current) use of insulin: Secondary | ICD-10-CM

## 2022-07-07 DIAGNOSIS — D3502 Benign neoplasm of left adrenal gland: Secondary | ICD-10-CM

## 2022-07-15 ENCOUNTER — Other Ambulatory Visit: Payer: Self-pay | Admitting: Internal Medicine

## 2022-07-15 DIAGNOSIS — D3502 Benign neoplasm of left adrenal gland: Secondary | ICD-10-CM

## 2022-07-15 DIAGNOSIS — E1122 Type 2 diabetes mellitus with diabetic chronic kidney disease: Secondary | ICD-10-CM

## 2022-07-26 ENCOUNTER — Other Ambulatory Visit: Payer: Self-pay

## 2022-07-27 ENCOUNTER — Other Ambulatory Visit: Payer: Self-pay

## 2022-07-29 ENCOUNTER — Other Ambulatory Visit: Payer: Self-pay | Admitting: Internal Medicine

## 2022-07-29 ENCOUNTER — Ambulatory Visit
Admission: RE | Admit: 2022-07-29 | Discharge: 2022-07-29 | Disposition: A | Discharge status: Home / Self Care | Payer: Medicare HMO | Source: Ambulatory Visit | Attending: Internal Medicine | Admitting: Internal Medicine

## 2022-07-29 DIAGNOSIS — E1122 Type 2 diabetes mellitus with diabetic chronic kidney disease: Secondary | ICD-10-CM

## 2022-07-29 DIAGNOSIS — Z794 Long term (current) use of insulin: Secondary | ICD-10-CM | POA: Insufficient documentation

## 2022-07-29 DIAGNOSIS — D3502 Benign neoplasm of left adrenal gland: Secondary | ICD-10-CM

## 2022-07-29 DIAGNOSIS — N189 Chronic kidney disease, unspecified: Secondary | ICD-10-CM | POA: Diagnosis not present

## 2022-07-29 DIAGNOSIS — N1831 Chronic kidney disease, stage 3a: Secondary | ICD-10-CM | POA: Diagnosis not present

## 2022-08-10 DIAGNOSIS — Z794 Long term (current) use of insulin: Secondary | ICD-10-CM | POA: Diagnosis not present

## 2022-08-10 DIAGNOSIS — N1831 Chronic kidney disease, stage 3a: Secondary | ICD-10-CM | POA: Diagnosis not present

## 2022-08-10 DIAGNOSIS — E1122 Type 2 diabetes mellitus with diabetic chronic kidney disease: Secondary | ICD-10-CM | POA: Diagnosis not present

## 2022-08-11 DIAGNOSIS — L821 Other seborrheic keratosis: Secondary | ICD-10-CM | POA: Diagnosis not present

## 2022-08-11 DIAGNOSIS — L820 Inflamed seborrheic keratosis: Secondary | ICD-10-CM | POA: Diagnosis not present

## 2022-08-11 DIAGNOSIS — L570 Actinic keratosis: Secondary | ICD-10-CM | POA: Diagnosis not present

## 2022-08-11 DIAGNOSIS — D485 Neoplasm of uncertain behavior of skin: Secondary | ICD-10-CM | POA: Diagnosis not present

## 2022-08-11 DIAGNOSIS — Z85828 Personal history of other malignant neoplasm of skin: Secondary | ICD-10-CM | POA: Diagnosis not present

## 2022-08-11 DIAGNOSIS — D1801 Hemangioma of skin and subcutaneous tissue: Secondary | ICD-10-CM | POA: Diagnosis not present

## 2022-08-17 DIAGNOSIS — I10 Essential (primary) hypertension: Secondary | ICD-10-CM | POA: Diagnosis not present

## 2022-08-17 DIAGNOSIS — E1159 Type 2 diabetes mellitus with other circulatory complications: Secondary | ICD-10-CM | POA: Diagnosis not present

## 2022-08-17 DIAGNOSIS — D3502 Benign neoplasm of left adrenal gland: Secondary | ICD-10-CM | POA: Diagnosis not present

## 2022-08-17 DIAGNOSIS — E1122 Type 2 diabetes mellitus with diabetic chronic kidney disease: Secondary | ICD-10-CM | POA: Diagnosis not present

## 2022-08-17 DIAGNOSIS — Z794 Long term (current) use of insulin: Secondary | ICD-10-CM | POA: Diagnosis not present

## 2022-08-17 DIAGNOSIS — N1832 Chronic kidney disease, stage 3b: Secondary | ICD-10-CM | POA: Diagnosis not present

## 2022-08-17 DIAGNOSIS — E782 Mixed hyperlipidemia: Secondary | ICD-10-CM | POA: Diagnosis not present

## 2022-08-17 DIAGNOSIS — R809 Proteinuria, unspecified: Secondary | ICD-10-CM | POA: Diagnosis not present

## 2022-08-17 DIAGNOSIS — E1129 Type 2 diabetes mellitus with other diabetic kidney complication: Secondary | ICD-10-CM | POA: Diagnosis not present

## 2022-08-28 ENCOUNTER — Other Ambulatory Visit: Payer: Self-pay

## 2022-09-29 ENCOUNTER — Other Ambulatory Visit: Payer: Self-pay

## 2022-10-08 ENCOUNTER — Encounter (HOSPITAL_COMMUNITY): Payer: Self-pay | Admitting: Internal Medicine

## 2022-10-08 ENCOUNTER — Emergency Department (HOSPITAL_COMMUNITY): Payer: Medicare HMO

## 2022-10-08 ENCOUNTER — Inpatient Hospital Stay (HOSPITAL_COMMUNITY)
Admission: EM | Admit: 2022-10-08 | Discharge: 2022-10-11 | DRG: 394 | Disposition: A | Discharge status: Home / Self Care | Payer: Medicare HMO | Attending: Family Medicine | Admitting: Family Medicine

## 2022-10-08 ENCOUNTER — Other Ambulatory Visit: Payer: Self-pay

## 2022-10-08 DIAGNOSIS — E785 Hyperlipidemia, unspecified: Secondary | ICD-10-CM | POA: Diagnosis present

## 2022-10-08 DIAGNOSIS — I2510 Atherosclerotic heart disease of native coronary artery without angina pectoris: Secondary | ICD-10-CM | POA: Diagnosis present

## 2022-10-08 DIAGNOSIS — K625 Hemorrhage of anus and rectum: Secondary | ICD-10-CM | POA: Diagnosis not present

## 2022-10-08 DIAGNOSIS — Z86718 Personal history of other venous thrombosis and embolism: Secondary | ICD-10-CM

## 2022-10-08 DIAGNOSIS — E860 Dehydration: Secondary | ICD-10-CM | POA: Diagnosis present

## 2022-10-08 DIAGNOSIS — K559 Vascular disorder of intestine, unspecified: Secondary | ICD-10-CM | POA: Diagnosis not present

## 2022-10-08 DIAGNOSIS — Z955 Presence of coronary angioplasty implant and graft: Secondary | ICD-10-CM

## 2022-10-08 DIAGNOSIS — Z86711 Personal history of pulmonary embolism: Secondary | ICD-10-CM

## 2022-10-08 DIAGNOSIS — K529 Noninfective gastroenteritis and colitis, unspecified: Secondary | ICD-10-CM

## 2022-10-08 DIAGNOSIS — Z833 Family history of diabetes mellitus: Secondary | ICD-10-CM

## 2022-10-08 DIAGNOSIS — Z8719 Personal history of other diseases of the digestive system: Secondary | ICD-10-CM

## 2022-10-08 DIAGNOSIS — I129 Hypertensive chronic kidney disease with stage 1 through stage 4 chronic kidney disease, or unspecified chronic kidney disease: Secondary | ICD-10-CM | POA: Diagnosis present

## 2022-10-08 DIAGNOSIS — N401 Enlarged prostate with lower urinary tract symptoms: Secondary | ICD-10-CM | POA: Diagnosis present

## 2022-10-08 DIAGNOSIS — K219 Gastro-esophageal reflux disease without esophagitis: Secondary | ICD-10-CM | POA: Diagnosis present

## 2022-10-08 DIAGNOSIS — N179 Acute kidney failure, unspecified: Secondary | ICD-10-CM | POA: Diagnosis present

## 2022-10-08 DIAGNOSIS — G4730 Sleep apnea, unspecified: Secondary | ICD-10-CM | POA: Diagnosis present

## 2022-10-08 DIAGNOSIS — G4733 Obstructive sleep apnea (adult) (pediatric): Secondary | ICD-10-CM | POA: Diagnosis present

## 2022-10-08 DIAGNOSIS — Z7901 Long term (current) use of anticoagulants: Secondary | ICD-10-CM

## 2022-10-08 DIAGNOSIS — K6389 Other specified diseases of intestine: Secondary | ICD-10-CM | POA: Diagnosis not present

## 2022-10-08 DIAGNOSIS — Z8614 Personal history of Methicillin resistant Staphylococcus aureus infection: Secondary | ICD-10-CM

## 2022-10-08 DIAGNOSIS — Z8249 Family history of ischemic heart disease and other diseases of the circulatory system: Secondary | ICD-10-CM

## 2022-10-08 DIAGNOSIS — I252 Old myocardial infarction: Secondary | ICD-10-CM

## 2022-10-08 DIAGNOSIS — N138 Other obstructive and reflux uropathy: Secondary | ICD-10-CM | POA: Diagnosis present

## 2022-10-08 DIAGNOSIS — R918 Other nonspecific abnormal finding of lung field: Secondary | ICD-10-CM | POA: Diagnosis present

## 2022-10-08 DIAGNOSIS — Z87891 Personal history of nicotine dependence: Secondary | ICD-10-CM

## 2022-10-08 DIAGNOSIS — Z7984 Long term (current) use of oral hypoglycemic drugs: Secondary | ICD-10-CM

## 2022-10-08 DIAGNOSIS — E278 Other specified disorders of adrenal gland: Secondary | ICD-10-CM | POA: Diagnosis present

## 2022-10-08 DIAGNOSIS — I10 Essential (primary) hypertension: Secondary | ICD-10-CM | POA: Diagnosis present

## 2022-10-08 DIAGNOSIS — E1122 Type 2 diabetes mellitus with diabetic chronic kidney disease: Secondary | ICD-10-CM | POA: Diagnosis present

## 2022-10-08 DIAGNOSIS — K760 Fatty (change of) liver, not elsewhere classified: Secondary | ICD-10-CM | POA: Diagnosis present

## 2022-10-08 DIAGNOSIS — N189 Chronic kidney disease, unspecified: Secondary | ICD-10-CM | POA: Diagnosis present

## 2022-10-08 DIAGNOSIS — Z7982 Long term (current) use of aspirin: Secondary | ICD-10-CM

## 2022-10-08 DIAGNOSIS — Z794 Long term (current) use of insulin: Secondary | ICD-10-CM

## 2022-10-08 DIAGNOSIS — Z79899 Other long term (current) drug therapy: Secondary | ICD-10-CM

## 2022-10-08 DIAGNOSIS — Z7985 Long-term (current) use of injectable non-insulin antidiabetic drugs: Secondary | ICD-10-CM

## 2022-10-08 DIAGNOSIS — E1169 Type 2 diabetes mellitus with other specified complication: Secondary | ICD-10-CM | POA: Diagnosis present

## 2022-10-08 LAB — COMPREHENSIVE METABOLIC PANEL
ALT: 50 U/L — ABNORMAL HIGH (ref 0–44)
AST: 51 U/L — ABNORMAL HIGH (ref 15–41)
Albumin: 4 g/dL (ref 3.5–5.0)
Alkaline Phosphatase: 42 U/L (ref 38–126)
Anion gap: 14 (ref 5–15)
BUN: 33 mg/dL — ABNORMAL HIGH (ref 8–23)
CO2: 18 mmol/L — ABNORMAL LOW (ref 22–32)
Calcium: 9.8 mg/dL (ref 8.9–10.3)
Chloride: 107 mmol/L (ref 98–111)
Creatinine, Ser: 1.55 mg/dL — ABNORMAL HIGH (ref 0.61–1.24)
GFR, Estimated: 48 mL/min — ABNORMAL LOW (ref >60)
Glucose, Bld: 123 mg/dL — ABNORMAL HIGH (ref 70–99)
Potassium: 4.6 mmol/L (ref 3.5–5.1)
Sodium: 139 mmol/L (ref 135–145)
Total Bilirubin: 1.2 mg/dL (ref 0.3–1.2)
Total Protein: 7.1 g/dL (ref 6.5–8.1)

## 2022-10-08 LAB — CBC
HCT: 48 % (ref 39.0–52.0)
HCT: 44.2 % (ref 39.0–52.0)
Hemoglobin: 16.5 g/dL (ref 13.0–17.0)
Hemoglobin: 15.5 g/dL (ref 13.0–17.0)
MCH: 31.1 pg (ref 26.0–34.0)
MCH: 31.4 pg (ref 26.0–34.0)
MCHC: 34.4 g/dL (ref 30.0–36.0)
MCHC: 35.1 g/dL (ref 30.0–36.0)
MCV: 90.4 fL (ref 80.0–100.0)
MCV: 89.7 fL (ref 80.0–100.0)
Platelets: 183 10*3/uL (ref 150–400)
Platelets: 196 10*3/uL (ref 150–400)
RBC: 5.31 MIL/uL (ref 4.22–5.81)
RBC: 4.93 MIL/uL (ref 4.22–5.81)
RDW: 13.3 % (ref 11.5–15.5)
RDW: 13.4 % (ref 11.5–15.5)
WBC: 13.3 10*3/uL — ABNORMAL HIGH (ref 4.0–10.5)
WBC: 11.9 10*3/uL — ABNORMAL HIGH (ref 4.0–10.5)
nRBC: 0 % (ref 0.0–0.2)
nRBC: 0 % (ref 0.0–0.2)

## 2022-10-08 LAB — GLUCOSE, CAPILLARY
Glucose-Capillary: 95 mg/dL (ref 70–99)
Glucose-Capillary: 96 mg/dL (ref 70–99)

## 2022-10-08 LAB — URINALYSIS, ROUTINE W REFLEX MICROSCOPIC
Bilirubin Urine: NEGATIVE
Glucose, UA: NEGATIVE mg/dL
Hgb urine dipstick: NEGATIVE
Ketones, ur: 5 mg/dL — AB
Leukocytes,Ua: NEGATIVE
Nitrite: NEGATIVE
Protein, ur: 30 mg/dL — AB
Specific Gravity, Urine: 1.026 (ref 1.005–1.030)
pH: 5 (ref 5.0–8.0)

## 2022-10-08 LAB — C DIFFICILE QUICK SCREEN W PCR REFLEX
C Diff antigen: NEGATIVE
C Diff interpretation: NOT DETECTED
C Diff toxin: NEGATIVE

## 2022-10-08 LAB — LIPASE, BLOOD: Lipase: 42 U/L (ref 11–51)

## 2022-10-08 LAB — LACTIC ACID, PLASMA
Lactic Acid, Venous: 2.2 mmol/L (ref 0.5–1.9)
Lactic Acid, Venous: 2.6 mmol/L (ref 0.5–1.9)
Lactic Acid, Venous: 1.7 mmol/L (ref 0.5–1.9)
Lactic Acid, Venous: 1.2 mmol/L (ref 0.5–1.9)

## 2022-10-08 LAB — CBG MONITORING, ED: Glucose-Capillary: 115 mg/dL — ABNORMAL HIGH (ref 70–99)

## 2022-10-08 MED ORDER — LACTATED RINGERS IV BOLUS
500.0000 mL | Freq: Once | INTRAVENOUS | Status: AC
  Administered 2022-10-08: 500 mL via INTRAVENOUS

## 2022-10-08 MED ORDER — BASAGLAR KWIKPEN 100 UNIT/ML ~~LOC~~ SOPN: 100.0000 [IU] | PEN_INJECTOR | Freq: Every day | SUBCUTANEOUS | Status: DC

## 2022-10-08 MED ORDER — LACTATED RINGERS IV BOLUS: 500.0000 mL | Freq: Once | INTRAVENOUS | Status: DC

## 2022-10-08 MED ORDER — ONDANSETRON 4 MG PO TBDP
4.0000 mg | ORAL_TABLET | Freq: Once | ORAL | Status: AC
  Administered 2022-10-08: 4 mg via ORAL
  Filled 2022-10-08: qty 1

## 2022-10-08 MED ORDER — CIPROFLOXACIN IN D5W 400 MG/200ML IV SOLN
400.0000 mg | Freq: Two times a day (BID) | INTRAVENOUS | Status: DC
  Administered 2022-10-08 – 2022-10-11 (×6): 400 mg via INTRAVENOUS
  Filled 2022-10-08 (×7): qty 200

## 2022-10-08 MED ORDER — ONDANSETRON HCL 4 MG/2ML IJ SOLN
4.0000 mg | Freq: Four times a day (QID) | INTRAMUSCULAR | Status: DC | PRN
  Administered 2022-10-08: 4 mg via INTRAVENOUS
  Filled 2022-10-08: qty 2

## 2022-10-08 MED ORDER — IOHEXOL 350 MG/ML SOLN
60.0000 mL | Freq: Once | INTRAVENOUS | Status: AC | PRN
  Administered 2022-10-08: 60 mL via INTRAVENOUS

## 2022-10-08 MED ORDER — ACETAMINOPHEN 650 MG RE SUPP: 650.0000 mg | Freq: Four times a day (QID) | RECTAL | Status: DC | PRN

## 2022-10-08 MED ORDER — LACTATED RINGERS IV SOLN: INTRAVENOUS | Status: DC

## 2022-10-08 MED ORDER — FINASTERIDE 5 MG PO TABS
5.0000 mg | ORAL_TABLET | Freq: Every day | ORAL | Status: DC
  Administered 2022-10-09 – 2022-10-11 (×2): 5 mg via ORAL
  Filled 2022-10-08 (×3): qty 1

## 2022-10-08 MED ORDER — INSULIN ASPART 100 UNIT/ML IJ SOLN: 0.0000 [IU] | Freq: Three times a day (TID) | INTRAMUSCULAR | Status: DC

## 2022-10-08 MED ORDER — ACETAMINOPHEN 325 MG PO TABS: 650.0000 mg | ORAL_TABLET | Freq: Four times a day (QID) | ORAL | Status: DC | PRN

## 2022-10-08 MED ORDER — LOSARTAN POTASSIUM 50 MG PO TABS
25.0000 mg | ORAL_TABLET | Freq: Every day | ORAL | Status: DC
  Administered 2022-10-09 – 2022-10-11 (×2): 25 mg via ORAL
  Filled 2022-10-08 (×3): qty 1

## 2022-10-08 MED ORDER — ONDANSETRON HCL 4 MG PO TABS: 4.0000 mg | ORAL_TABLET | Freq: Four times a day (QID) | ORAL | Status: DC | PRN

## 2022-10-08 MED ORDER — METRONIDAZOLE 500 MG/100ML IV SOLN
500.0000 mg | Freq: Two times a day (BID) | INTRAVENOUS | Status: DC
  Administered 2022-10-09 – 2022-10-11 (×5): 500 mg via INTRAVENOUS
  Filled 2022-10-08 (×5): qty 100

## 2022-10-08 MED ORDER — MORPHINE SULFATE (PF) 2 MG/ML IV SOLN: 2.0000 mg | INTRAVENOUS | Status: DC | PRN

## 2022-10-08 MED ORDER — METRONIDAZOLE 500 MG/100ML IV SOLN
500.0000 mg | Freq: Once | INTRAVENOUS | Status: AC
  Administered 2022-10-08: 500 mg via INTRAVENOUS
  Filled 2022-10-08: qty 100

## 2022-10-08 MED ORDER — ROSUVASTATIN CALCIUM 20 MG PO TABS
40.0000 mg | ORAL_TABLET | Freq: Every day | ORAL | Status: DC
  Administered 2022-10-08 – 2022-10-10 (×3): 40 mg via ORAL
  Filled 2022-10-08 (×3): qty 2

## 2022-10-08 MED ORDER — LACTATED RINGERS IV BOLUS
1000.0000 mL | Freq: Once | INTRAVENOUS | Status: AC
  Administered 2022-10-08: 1000 mL via INTRAVENOUS

## 2022-10-08 MED ORDER — INSULIN ASPART 100 UNIT/ML IJ SOLN: 0.0000 [IU] | Freq: Every day | INTRAMUSCULAR | Status: DC

## 2022-10-08 MED ORDER — INSULIN GLARGINE-YFGN 100 UNIT/ML ~~LOC~~ SOLN
50.0000 [IU] | Freq: Every day | SUBCUTANEOUS | Status: DC
  Filled 2022-10-08 (×3): qty 0.5

## 2022-10-08 MED ORDER — METOPROLOL TARTRATE 12.5 MG HALF TABLET
12.5000 mg | ORAL_TABLET | Freq: Two times a day (BID) | ORAL | Status: DC
  Administered 2022-10-08 – 2022-10-11 (×5): 12.5 mg via ORAL
  Filled 2022-10-08 (×6): qty 1

## 2022-10-08 MED ORDER — CIPROFLOXACIN IN D5W 400 MG/200ML IV SOLN: 400.0000 mg | Freq: Once | INTRAVENOUS | Status: DC

## 2022-10-08 MED ORDER — BASAGLAR KWIKPEN 100 UNIT/ML ~~LOC~~ SOPN: 50.0000 [IU] | PEN_INJECTOR | Freq: Every day | SUBCUTANEOUS | Status: DC

## 2022-10-08 MED ORDER — HYDRALAZINE HCL 20 MG/ML IJ SOLN: 5.0000 mg | INTRAMUSCULAR | Status: DC | PRN

## 2022-10-08 MED ORDER — TAMSULOSIN HCL 0.4 MG PO CAPS
0.4000 mg | ORAL_CAPSULE | Freq: Every day | ORAL | Status: DC
  Administered 2022-10-09 – 2022-10-11 (×2): 0.4 mg via ORAL
  Filled 2022-10-08 (×2): qty 1

## 2022-10-08 NOTE — H&P (Signed)
History and Physical    Patient: Alan Morrison DOB: 31-Oct-1950 DOA: 10/08/2022 DOS: the patient was seen and examined on 10/08/2022 PCP: Eustaquio Boyden, MD  Patient coming from: Home - lives with wife; NOK: Wife, [phone]   Chief Complaint: lower GI bleeding  HPI: Alan Morrison is a 72 y.o. male with medical history significant of CAD s/p stent, PE on Xarelto, DM, HLD, and OSA presenting with hematochezia. He reports that he was having nausea/vomiting and diarrhea with blood in his stools.  Symptoms started about 0100.  Stools with dark red blood.  No prior h/o GI bleeding.  No recent dietary concerns for food borne illness.  His wife was out of town yesterday and is not ill.  No fever.  Occasionally light-headed/dizzy, not currently - last episode was several weeks ago and his glucose was low.  He is on Xarelto - did not keep down doses yesterday or this AM.  PE was in November of last year but also had one prior to that.    ER Course:  Probable infectious colitis with rectal bleeding.  Cipro/Flagyl, likely supportive care.  GI recommends obs, will see.     Review of Systems: As mentioned in the history of present illness. All other systems reviewed and are negative. 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   by [place]   Bilateral pulmonary embolism (HCC) 02/05/2022   Cervical spondylosis without myelopathy    Choroidal nevus 01/22/2011   left, yearly eye exam, no diabetic retinopathy   Diabetes mellitus type II    DVT of deep femoral vein, left (HCC) 04/29/2018   LLE DVT from proximal femoral vein to popliteal vein into proximal calf (04/29/2018) started on xarelto Heme (Magrinat) recommended continuing lifelong lower dose anticoagulant indefinitely  (11/2018)   Dyspepsia    Ex-smoker    Fatty liver 02/2016   by [place]   GERD (gastroesophageal reflux disease)    Headache(784.0)    Heart murmur    Hx of   HLD (hyperlipidemia)    Hyperuricemia    Obesity    Sleep apnea    Stress-induced cardiomyopathy 09/27/1998   WNL, EF 64%   Urosepsis 01/01-01/05/10   Hospitalization   Past Surgical History:  Procedure Laterality Date   CARDIAC CATHETERIZATION     COLONOSCOPY  07/2013   1 benign polyp rpt 10 yrs Arlyce Dice)   CORONARY THROMBECTOMY N/A 11/12/2019   Procedure: Coronary Thrombectomy;  Surgeon: Lyn Records, MD;  Location: [facility];  Service: Cardiovascular;  Laterality: N/A;   CORONARY ULTRASOUND/IVUS N/A 01/30/2022   Procedure: Intravascular Ultrasound/IVUS;  Surgeon: Cephus Shelling, MD;  Location: [facility];  Service: Cardiovascular;  Laterality: N/A;  Venous   CORONARY/GRAFT ACUTE MI REVASCULARIZATION N/A 11/12/2019   Procedure: Coronary/Graft Acute MI Revascularization;  Surgeon: Lyn Records, MD;  Location: [facility];  Service: Cardiovascular;  Laterality: N/A;   KNEE ARTHROSCOPY Left 1988   KNEE SURGERY Right 05/21/03   Right, Dr. August Saucer, med meniscus tear via MRI   LEFT HEART CATH AND CORONARY ANGIOGRAPHY N/A 04/01/2017   Procedure: LEFT HEART CATH AND CORONARY ANGIOGRAPHY;  Surgeon: [place], Peter M, MD;  Location: [facility];  Service: Cardiovascular;  Laterality: N/A;  LEFT HEART CATH AND CORONARY ANGIOGRAPHY N/A 11/12/2019   Procedure: LEFT HEART CATH AND CORONARY ANGIOGRAPHY;  Surgeon: Lyn Records, MD;  Location: [facility];  Service: Cardiovascular;  Laterality: N/A;   MYELOGRAM  08/05   bulging disc   PERIPHERAL VASCULAR BALLOON ANGIOPLASTY  01/30/2022   Procedure: PERIPHERAL VASCULAR BALLOON ANGIOPLASTY;  Surgeon: Cephus Shelling, MD;  Location: [facility];  Service: Cardiovascular;;   PERIPHERAL VASCULAR THROMBECTOMY Right 01/30/2022   Procedure:  PERIPHERAL VASCULAR THROMBECTOMY;  Surgeon: Cephus Shelling, MD;  Location: [facility];  Service: Cardiovascular;  Laterality: Right;   Social History:  reports that he quit smoking about 48 years ago. His smoking use included cigarettes. He has never been exposed to tobacco smoke. He has never used smokeless tobacco. He reports current alcohol use of about 3.0 standard drinks of alcohol per week. He reports that he does not use drugs.  No Known Allergies  Family History  Problem Relation Age of Onset   Heart disease Mother        CHF   Diabetes Mother    Heart disease Father        CHF   Hypertension Father    Pulmonary fibrosis Sister    Sjogren's syndrome Sister    Migraines Brother        severe headaches from arsenic in the past from  wood that was treated on his deck   CAD Brother 62       stent   Cancer Maternal Uncle        unsure   Stroke Neg Hx    Colon cancer Neg Hx     Prior to Admission medications   Medication Sig Start Date End Date Taking? Authorizing Provider  acetaminophen (TYLENOL) 500 MG tablet Take 1,000 mg by mouth 2 (two) times daily as needed for headache (pain).    [provider]  aspirin EC 81 MG tablet Take 81 mg by mouth daily with supper. Swallow whole.    [provider]  Calcium Carbonate Antacid (TUMS PO) Take 1 tablet by mouth daily as needed (acid reflux/indigestion).    [provider]  Cholecalciferol (VITAMIN D) 2000 units tablet Take 2,000 Units by mouth daily with breakfast.     [provider]  finasteride (PROSCAR) 5 MG tablet Take 1 tablet (5 mg total) by mouth daily. 06/17/22   Eustaquio Boyden, MD  Insulin Glargine [facility]) 100 UNIT/ML Inject 100 Units into the skin at bedtime. 06/10/20   Eustaquio Boyden, MD  Insulin Pen Needle (B-D ULTRAFINE III SHORT PEN) 31G X 8 MM MISC 1 Device by Other route 4 (four) times daily. USE AS DIRECTED DAILY 06/09/18   Romero Belling, MD  Iron,  Ferrous Sulfate, 325 (65 Fe) MG TABS Take 325 mg by mouth every other day. 06/16/21   Eustaquio Boyden, MD  losartan (COZAAR) 25 MG tablet TAKE 1 TABLET (25 MG TOTAL) BY MOUTH DAILY. 03/19/22   [place], Peter M, MD  metFORMIN (GLUCOPHAGE) 1000 MG tablet Take 1 tablet (1,000 mg total) by mouth 2 (two) times daily with a meal. 06/17/22   Eustaquio Boyden, MD  metoprolol tartrate (LOPRESSOR) 25 MG tablet TAKE 0.5 TABLETS BY MOUTH 2 TIMES DAILY. Patient taking differently: Take 12.5 mg by mouth 2 (two) times daily. 10/16/21   [place], Peter M, MD  [facility] 10 MG/0.5ML Pen Inject into the skin. 06/02/22   [provider]  Multiple Vitamin (MULTIVITAMIN WITH MINERALS)  TABS tablet Take 1 tablet by mouth daily with breakfast.     [provider]  nitroGLYCERIN (NITROSTAT) 0.4 MG SL tablet PLACE 1 TABLET UNDER THE TONGUE EVERY 5 MINUTES AS NEEDED FOR CHEST PAIN Patient taking differently: Place 0.4 mg under the tongue every 5 (five) minutes as needed for chest pain. 05/27/21   [place], Peter M, MD  [facility] ULTRA test strip USE TO CHECK SUGARS DAILY  AS DIRECTED 03/13/19   Eustaquio Boyden, MD  probenecid (BENEMID) 500 MG tablet Take 0.5 tablets (250 mg total) by mouth every other day. 06/17/22   Eustaquio Boyden, MD  rivaroxaban (XARELTO) 10 MG TABS tablet Take 1 tablet (10 mg total) by mouth daily. 06/17/22   Eustaquio Boyden, MD  rosuvastatin (CRESTOR) 40 MG tablet Take 1 tablet (40 mg total) by mouth daily with supper. 10/16/21   [place], Peter M, MD  tamsulosin (FLOMAX) 0.4 MG CAPS capsule Take 1 capsule (0.4 mg total) by mouth daily. 06/17/22   Eustaquio Boyden, MD  tirzepatide [facility]) 10 MG/0.5ML Pen Inject 10 mg into the skin once a week. 05/07/22       Physical Exam: Vitals:   10/08/22 1133 10/08/22 1315 10/08/22 1330 10/08/22 1515  BP:  126/71 136/74   Pulse:  85 90 (!) 101  Resp:  15 14 14   Temp:      SpO2:  100% 100% 100%  Weight: 81.6 kg     Height: 5' 7.5" (1.715 m)       General:  Appears calm and comfortable and is in NAD Eyes:  PERRL, EOMI, normal lids, iris ENT:  grossly normal hearing, lips & tongue, mmm; appropriate dentition Neck:  no LAD, masses or thyromegaly Cardiovascular:  RRR, no m/r/g. No LE edema.  Respiratory:   CTA bilaterally with no wheezes/rales/rhonchi.  Normal respiratory effort. Abdomen:  soft, NT, ND Skin:  no rash or induration seen on limited exam Musculoskeletal:  grossly normal tone BUE/BLE, good ROM, no bony abnormality Psychiatric:  grossly normal mood and affect, speech fluent and appropriate, AOx3 Neurologic:  CN 2-12 grossly intact, moves all extremities in coordinated fashion   Radiological Exams on Admission: Independently reviewed - see discussion in A/P where applicable  CT ABDOMEN PELVIS W CONTRAST  Result Date: 10/08/2022 CLINICAL DATA:  Nausea, vomiting, diarrhea. EXAM: CT ABDOMEN AND PELVIS WITH CONTRAST TECHNIQUE: Multidetector CT imaging of the abdomen and pelvis was performed using the standard protocol following bolus administration of intravenous contrast. RADIATION DOSE REDUCTION: This exam was performed according to the departmental dose-optimization program which includes automated exposure control, adjustment of the mA and/or kV according to patient size and/or use of iterative reconstruction technique. CONTRAST:  60mL OMNIPAQUE IOHEXOL 350 MG/ML SOLN COMPARISON:  July 29, 2022. FINDINGS: Lower chest: No acute abnormality. Hepatobiliary: No focal liver abnormality is seen. No gallstones, gallbladder wall thickening, or biliary dilatation. Pancreas: Unremarkable. No pancreatic ductal dilatation or surrounding inflammatory changes. Spleen: Normal in size without focal abnormality. Adrenals/Urinary Tract: Stable left adrenal adenoma. Right adrenal gland is unremarkable. No hydronephrosis or renal obstruction is noted. Urinary bladder is unremarkable. Stomach/Bowel: Stomach and appendix appear normal. There is no  evidence of bowel obstruction. Moderate wall thickening of descending colon is noted concerning for infectious or inflammatory colitis. Vascular/Lymphatic: Aortic atherosclerosis. No enlarged abdominal or pelvic lymph nodes. Reproductive: Mild prostatic enlargement. Other: No abdominal wall hernia or abnormality. No abdominopelvic ascites. Musculoskeletal: No acute or significant osseous findings. IMPRESSION: Moderate wall thickening of descending colon is noted concerning for  infectious or inflammatory colitis. Mild prostatic enlargement. Aortic Atherosclerosis (ICD10-I70.0). Electronically Signed   By: Lupita Raider M.D.   On: 10/08/2022 13:57    EKG: none   Labs on Admission: I have personally reviewed the available labs and imaging studies at the time of the admission.  Pertinent labs:   CO2 18 Glucose 123 BUN 33/Creatinine 1.55/GFR 48 - stable AST 51/ALT 50 Lactate 2.2 WBC 13.3 UA: 5 ketones, 30 protein    Assessment and Plan: Principal Problem:   Colitis with rectal bleeding Active Problems:   Essential hypertension, benign   Type 2 diabetes mellitus with chronic kidney disease, with long-term current use of insulin (HCC)   Coronary artery disease   Benign prostatic hyperplasia with urinary obstruction   Sleep apnea   Hyperlipidemia associated with type 2 diabetes mellitus (HCC)    Colitis -Patient developed acute onset overnight of n/v/d with blood -His only SIRS criteria is leukocytosis (13.3) -His lactate is mildly elevated, likely from dehydration in the setting of n/v/d and rectal bleeding -CT with apparent colitis -Will observe on med surg -Clear liquids -Pain medication with morphine -Nausea medication with Zofran  -Will treat with Cipro/Flagyl for intraabdominal infection -GI is consulting - he currently appears likely to need outpatient colonoscopy once improved -Will trend lactate  -Stool pathogen panel and C diff ordered  CAD -s/p stent -Compensated at  this time -Hold ASA for now  H/o recurrent PE -Needs lifelong AC -Last tolerated Xarelto dose was on 6/18 AM -Continue to hold until bleeding resolves -While he does need lifelong AC, his last PE was remote enough that bleeding risks appear to be greater than holding AC at this time  DM -Last A1c was 8.3, poor control -hold Glucophage, Mounjaro -Continue glargine but at 50 units (1/2 dose) -Cover with moderate-scale SSI   HTN -Resume metoprolol, losartan  HLD -Continue rosuvastatin  OSA -Will order CPAP  BPH -Continue finasteride and tamsulosin    Advance Care Planning:   Code Status: Full Code - Code status was discussed with the patient and/or family at the time of admission.  The patient would want to receive full resuscitative measures at this time.   Consults: GI  DVT Prophylaxis: SCDs  Family Communication: Wife was present throughout evaluation  Severity of Illness: The appropriate patient status for this patient is OBSERVATION. Observation status is judged to be reasonable and necessary in order to provide the required intensity of service to ensure the patient's safety. The patient's presenting symptoms, physical exam findings, and initial radiographic and laboratory data in the context of their medical condition is felt to place them at decreased risk for further clinical deterioration. Furthermore, it is anticipated that the patient will be medically stable for discharge from the hospital within 2 midnights of admission.   Author: Jonah Blue, MD 10/08/2022 3:58 PM  For on call review [email or web address].

## 2022-10-08 NOTE — ED Notes (Signed)
ED TO INPATIENT HANDOFF REPORT  ED Nurse Name and Phone #: Rexine Gowens [phone]  S Name/Age/Gender Alan Morrison 72 y.o. male Room/Bed: 031C/031C  Code Status   Code Status: Prior  Home/SNF/Other Home Patient oriented to: self, place, time, and situation Is this baseline? Yes   Triage Complete: Triage complete  Chief Complaint Colitis with rectal bleeding [K52.9, K62.5]  Triage Note Pt. Stated, Lavenia Atlas had some stomach issues with blood in my stool with N/V/D since yesterday, not able to keep anything down.   Allergies No Known Allergies  Level of Care/Admitting Diagnosis ED Disposition     ED Disposition  Admit   Condition  --   Comment  Hospital Area: [facility] [[id number]]  Level of Care: Med-Surg [16]  May place patient in observation at [facility] or [facility] if equivalent level of care is available:: Yes  Covid Evaluation: Asymptomatic - no recent exposure (last 10 days) testing not required  Diagnosis: Colitis with rectal bleeding [[id number]]  Admitting Physician: Jonah Blue [[id number]]  Attending Physician: Jonah Blue [[id number]]          B Medical/Surgery History 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   by [place]   Bilateral pulmonary embolism (HCC) 02/05/2022   Cervical spondylosis without myelopathy    Choroidal nevus 01/22/2011   left, yearly eye exam, no diabetic retinopathy   Diabetes mellitus type II    DVT of deep femoral vein, left (HCC) 04/29/2018   LLE DVT from proximal femoral vein to popliteal vein into proximal calf (04/29/2018) started on xarelto Heme (Magrinat) recommended continuing lifelong lower dose anticoagulant indefinitely (11/2018)   Dyspepsia    Ex-smoker    Fatty liver 02/2016   by [place]   GERD  (gastroesophageal reflux disease)    Headache(784.0)    Heart murmur    Hx of   HLD (hyperlipidemia)    Hyperuricemia    Obesity    Sleep apnea    Stress-induced cardiomyopathy 09/27/1998   WNL, EF 64%   Urosepsis 01/01-01/05/10   Hospitalization   Past Surgical History:  Procedure Laterality Date   CARDIAC CATHETERIZATION     COLONOSCOPY  07/2013   1 benign polyp rpt 10 yrs Arlyce Dice)   CORONARY THROMBECTOMY N/A 11/12/2019   Procedure: Coronary Thrombectomy;  Surgeon: Lyn Records, MD;  Location: [facility] INVASIVE CV LAB;  Service: Cardiovascular;  Laterality: N/A;   CORONARY ULTRASOUND/IVUS N/A 01/30/2022   Procedure: Intravascular Ultrasound/IVUS;  Surgeon: Cephus Shelling, MD;  Location: [facility] INVASIVE CV LAB;  Service: Cardiovascular;  Laterality: N/A;  Venous   CORONARY/GRAFT ACUTE MI REVASCULARIZATION N/A 11/12/2019   Procedure: Coronary/Graft Acute MI Revascularization;  Surgeon: Lyn Records, MD;  Location: [facility] INVASIVE CV LAB;  Service: Cardiovascular;  Laterality: N/A;   KNEE ARTHROSCOPY Left 1988   KNEE SURGERY Right 05/21/03   Right, Dr. August Saucer, med meniscus tear via MRI   LEFT HEART CATH AND CORONARY ANGIOGRAPHY N/A 04/01/2017   Procedure: LEFT HEART CATH AND CORONARY ANGIOGRAPHY;  Surgeon: [place], Peter M, MD;  Location: [facility] INVASIVE CV LAB;  Service: Cardiovascular;  Laterality: N/A;   LEFT HEART CATH AND CORONARY ANGIOGRAPHY N/A 11/12/2019   Procedure: LEFT HEART CATH AND CORONARY ANGIOGRAPHY;  Surgeon: Lyn Records, MD;  Location:  [facility]VAS[facility] CV [facility];  Service: Cardiovascular;  Laterality: N/A;   MYELOGRAM  08/05   bulging disc   PERIPHERAL VASCULAR BALLOON ANGIOPLASTY  01/30/2022   Procedure: PERIPHERAL VASCULAR BALLOON ANGIOPLASTY;  Surgeon: Cephus Shelling, MD;  Location: [facility];  Service: Cardiovascular;;   PERIPHERAL VASCULAR THROMBECTOMY Right 01/30/2022   Procedure: PERIPHERAL VASCULAR THROMBECTOMY;  Surgeon: Cephus Shelling, MD;  Location: [facility]  INVASIVE CV LAB;  Service: Cardiovascular;  Laterality: Right;     A IV Location/Drains/Wounds Patient Lines/Drains/Airways Status     Active Line/Drains/Airways     Name Placement date Placement time Site Days   Peripheral IV 10/08/22 20 G Anterior;Distal;Right;Upper Arm 10/08/22  1252  Arm  less than 1            Intake/Output Last 24 hours No intake or output data in the 24 hours ending 10/08/22 1509  Labs/Imaging Results for orders placed or performed during the hospital encounter of 10/08/22 (from the past 48 hour(s))  Lipase, blood     Status: None   Collection Time: 10/08/22 11:36 AM  Result Value Ref Range   Lipase 42 11 - 51 U/L    Comment: Performed at [facility], [address] [place], [place] [address]  Comprehensive metabolic panel     Status: Abnormal   Collection Time: 10/08/22 11:36 AM  Result Value Ref Range   Sodium 139 135 - 145 mmol/L   Potassium 4.6 3.5 - 5.1 mmol/L   Chloride 107 98 - 111 mmol/L   CO2 18 (L) 22 - 32 mmol/L   Glucose, Bld 123 (H) 70 - 99 mg/dL    Comment: Glucose reference range applies only to samples taken after fasting for at least 8 hours.   BUN 33 (H) 8 - 23 mg/dL   Creatinine, Ser 6.04 (H) 0.61 - 1.24 mg/dL   Calcium 9.8 8.9 - 54.0 mg/dL   Total Protein 7.1 6.5 - 8.1 g/dL   Albumin 4.0 3.5 - 5.0 g/dL   AST 51 (H) 15 - 41 U/L   ALT 50 (H) 0 - 44 U/L   Alkaline Phosphatase 42 38 - 126 U/L   Total Bilirubin 1.2 0.3 - 1.2 mg/dL   GFR, Estimated 48 (L) >60 mL/min    Comment: (NOTE) Calculated using the CKD-EPI Creatinine Equation (2021)    Anion gap 14 5 - 15    Comment: Performed at [facility], [address] [place], [place] [address]  CBC     Status: Abnormal   Collection Time: 10/08/22 11:36 AM  Result Value Ref Range   WBC 13.3 (H) 4.0 - 10.5 K/uL   RBC 5.31 4.22 - 5.81 MIL/uL   Hemoglobin 16.5 13.0 - 17.0 g/dL   HCT 14.7 82.9 - 56.2 %   MCV 90.4 80.0 - 100.0 fL   MCH 31.1 26.0 - 34.0 pg   MCHC  34.4 30.0 - 36.0 g/dL   RDW 13.0 86.5 - 78.4 %   Platelets 183 150 - 400 K/uL   nRBC 0.0 0.0 - 0.2 %    Comment: Performed at [facility], [address] [place], [place] [address]  Urinalysis, Routine w reflex microscopic -Urine, Clean Catch     Status: Abnormal   Collection Time: 10/08/22 11:36 AM  Result Value Ref Range   Color, Urine AMBER (A) YELLOW    Comment: BIOCHEMICALS MAY BE AFFECTED BY COLOR   APPearance HAZY (A) CLEAR   Specific  Gravity, Urine 1.026 1.005 - 1.030   pH 5.0 5.0 - 8.0   Glucose, UA NEGATIVE NEGATIVE mg/dL   Hgb urine dipstick NEGATIVE NEGATIVE   Bilirubin Urine NEGATIVE NEGATIVE   Ketones, ur 5 (A) NEGATIVE mg/dL   Protein, ur 30 (A) NEGATIVE mg/dL   Nitrite NEGATIVE NEGATIVE   Leukocytes,Ua NEGATIVE NEGATIVE   RBC / HPF 0-5 0 - 5 RBC/hpf   WBC, UA 6-10 0 - 5 WBC/hpf   Bacteria, UA FEW (A) NONE SEEN   Squamous Epithelial / HPF 0-5 0 - 5 /HPF   Mucus PRESENT    Hyaline Casts, UA PRESENT     Comment: Performed at [facility], [address] [place], [place] [address]  CBG monitoring, ED     Status: Abnormal   Collection Time: 10/08/22 11:59 AM  Result Value Ref Range   Glucose-Capillary 115 (H) 70 - 99 mg/dL    Comment: Glucose reference range applies only to samples taken after fasting for at least 8 hours.  Lactic acid, plasma     Status: Abnormal   Collection Time: 10/08/22 12:53 PM  Result Value Ref Range   Lactic Acid, Venous 2.2 (HH) 0.5 - 1.9 mmol/L    Comment: CRITICAL RESULT CALLED TO, READ BACK BY AND VERIFIED WITH Ipek Westra,RN AT 1358 10/08/2022 BY ZBEECH. Performed at [facility], [address], [place], [place] [address]    CT ABDOMEN PELVIS W CONTRAST  Result Date: 10/08/2022 CLINICAL DATA:  Nausea, vomiting, diarrhea. EXAM: CT ABDOMEN AND PELVIS WITH CONTRAST TECHNIQUE: Multidetector CT imaging of the abdomen and pelvis was performed using the standard protocol following bolus administration of intravenous  contrast. RADIATION DOSE REDUCTION: This exam was performed according to the departmental dose-optimization program which includes automated exposure control, adjustment of the mA and/or kV according to patient size and/or use of iterative reconstruction technique. CONTRAST:  60mL OMNIPAQUE IOHEXOL 350 MG/ML SOLN COMPARISON:  July 29, 2022. FINDINGS: Lower chest: No acute abnormality. Hepatobiliary: No focal liver abnormality is seen. No gallstones, gallbladder wall thickening, or biliary dilatation. Pancreas: Unremarkable. No pancreatic ductal dilatation or surrounding inflammatory changes. Spleen: Normal in size without focal abnormality. Adrenals/Urinary Tract: Stable left adrenal adenoma. Right adrenal gland is unremarkable. No hydronephrosis or renal obstruction is noted. Urinary bladder is unremarkable. Stomach/Bowel: Stomach and appendix appear normal. There is no evidence of bowel obstruction. Moderate wall thickening of descending colon is noted concerning for infectious or inflammatory colitis. Vascular/Lymphatic: Aortic atherosclerosis. No enlarged abdominal or pelvic lymph nodes. Reproductive: Mild prostatic enlargement. Other: No abdominal wall hernia or abnormality. No abdominopelvic ascites. Musculoskeletal: No acute or significant osseous findings. IMPRESSION: Moderate wall thickening of descending colon is noted concerning for infectious or inflammatory colitis. Mild prostatic enlargement. Aortic Atherosclerosis (ICD10-I70.0). Electronically Signed   By: Lupita Raider M.D.   On: 10/08/2022 13:57    Pending Labs Unresulted Labs (From admission, onward)     Start     Ordered   10/08/22 1239  Lactic acid, plasma  Now then every 2 hours,   R      10/08/22 1239   10/08/22 1239  Gastrointestinal Panel by PCR , Stool  (Gastrointestinal Panel by PCR, Stool                                                                                                                                                      **  Does Not include CLOSTRIDIUM DIFFICILE testing. **If CDIFF testing is needed, place order from the "C Difficile Testing" order set.**)  Once,   URGENT        10/08/22 1239   10/08/22 1239  C Difficile Quick Screen w PCR reflex  (C Difficile quick screen w PCR reflex panel )  Once, for 24 hours,   URGENT       References:    CDiff Information Tool   10/08/22 1239            Vitals/Pain Today's Vitals   10/08/22 1109 10/08/22 1133 10/08/22 1315 10/08/22 1330  BP: 127/79  126/71 136/74  Pulse: 85  85 90  Resp: 16  15 14   Temp: 98.4 F (36.9 C)     SpO2: 98%  100% 100%  Weight:  81.6 kg    Height:  5' 7.5" (1.715 m)    PainSc:  7       Isolation Precautions Enteric precautions (UV disinfection)  Medications Medications  lactated ringers infusion (has no administration in time range)  ciprofloxacin (CIPRO) IVPB 400 mg (has no administration in time range)  metroNIDAZOLE (FLAGYL) IVPB 500 mg (has no administration in time range)  ondansetron (ZOFRAN-ODT) disintegrating tablet 4 mg (4 mg Oral Given 10/08/22 1139)  lactated ringers bolus 1,000 mL (0 mLs Intravenous Stopped 10/08/22 1425)  iohexol (OMNIPAQUE) 350 MG/ML injection 60 mL (60 mLs Intravenous Contrast Given 10/08/22 1339)    Mobility walks     Focused Assessments GI assessment   R Recommendations: See Admitting Provider Note  Report given to:   Additional Notes:

## 2022-10-08 NOTE — Progress Notes (Signed)
Pr refused our CPAP @this  time. Informed one is available @anytime  should he change his mind. Will follow.

## 2022-10-08 NOTE — Consult Note (Signed)
Consultation  Referring Provider:  [facility]  Primary Care Physician:  Eustaquio Boyden, MD Primary Gastroenterologist:  Previous patient of Dr. Arlyce Dice       Reason for Consultation:     Nausea, vomiting, diarrhea  LOS: 0 days          HPI:   Alan Morrison is a 72 y.o. male with past medical history significant for type 2 diabetes on insulin, CAD (on Xarelto), hypertension, obesity, hyperlipidemia, GERD, DVT with PE, presents for evaluation of nausea, vomiting, and diarrhea.  Patient states last night around 1 AM he began having nausea, vomiting, and bloody diarrhea.  Nonbloody bilious emesis.  Reports 7-15 episodes of diarrhea overnight.  States stool was watery and toilet bowl filled with dark red blood.  Associated mild abdominal cramping/discomfort but no severe pain.  No previous episodes of rectal bleeding.  Up until yesterday he was in his usual state of health.  He typically struggles with constipation intermittently and strains often with bowel movements.  Denies antibiotic use.  Last dose of Xarelto yesterday p.m., though thinks he vomited it back up.  Takes aspirin daily, denies other NSAIDs.  CT abdomen pelvis with contrast shows moderate wall thickening of descending colon concerning for infectious versus inflammatory colitis.  No gallstones or gallbladder wall thickening.  Normal pancreas. Hgb 16.5 Leukocytosis with WBC 13.3 AST 51/ALT 50/alk phos 42 BUN 33, creatinine 1.55, GFR 48 Lactic acid 2.2 GI pathogen panel and C. difficile pending  Colonoscopy with Dr. Arlyce Dice 07/2013 for screening: hyperplastic polyp (2 mm) in sigmoid colon.  Otherwise normal.  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   by [place]   Bilateral pulmonary embolism (HCC)  02/05/2022   CAD (coronary artery disease) 2003   cath - Min Dz, EF 65%, Adm.R/O'd   Cervical spondylosis without myelopathy    Chest pain, atypical    Choroidal nevus 01/22/2011   left, yearly eye exam, no diabetic retinopathy   Diabetes mellitus type II    DVT of deep femoral vein, left (HCC) 04/29/2018   LLE DVT from proximal femoral vein to popliteal vein into proximal calf (04/29/2018) started on xarelto Heme (Magrinat) recommended continuing lifelong lower dose anticoagulant indefinitely (11/2018)   Dyspepsia    Ex-smoker    Fatty liver 02/2016   by [place]   GERD (gastroesophageal reflux disease)    Headache(784.0)    Heart murmur    Hx of   History of MRI of brain and brain stem 12/2004   History of MRSA infection 12/2013   R knee boil   HLD (hyperlipidemia)    Hyperuricemia    Obesity    Sleep apnea    Stress-induced cardiomyopathy 09/27/1998   WNL, EF 64%   Urosepsis 01/01-01/05/10   Hospitalization    Surgical History:  He  has a past surgical history that includes Knee arthroscopy (Left, 1988); Knee surgery (Right, 05/21/03); Myelogram (08/05); Colonoscopy (07/2013); LEFT HEART CATH AND CORONARY ANGIOGRAPHY (N/A, 04/01/2017); Coronary/Graft Acute MI Revascularization (N/A, 11/12/2019); LEFT HEART CATH AND CORONARY ANGIOGRAPHY (N/A, 11/12/2019); Coronary Thrombectomy (N/A, 11/12/2019); Cardiac catheterization; PERIPHERAL VASCULAR THROMBECTOMY (Right, 01/30/2022); PERIPHERAL VASCULAR BALLOON ANGIOPLASTY (01/30/2022); and Coronary Ultrasound/IVUS (N/A, 01/30/2022). Family History:  His family history includes CAD (  age of onset: 69) in his brother; Cancer in his maternal uncle; Diabetes in his mother; Heart disease in his father and mother; Hypertension in his father; Migraines in his brother; Pulmonary fibrosis in his sister; Sjogren's syndrome in his sister. Social History:   reports that he quit smoking about 48 years ago. His smoking use included cigarettes. He has never been  exposed to tobacco smoke. He has never used smokeless tobacco. He reports current alcohol use of about 3.0 standard drinks of alcohol per week. He reports that he does not use drugs.  Prior to Admission medications   Medication Sig Start Date End Date Taking? Authorizing Provider  acetaminophen (TYLENOL) 500 MG tablet Take 1,000 mg by mouth 2 (two) times daily as needed for headache (pain).    [provider]  aspirin EC 81 MG tablet Take 81 mg by mouth daily with supper. Swallow whole.    [provider]  Calcium Carbonate Antacid (TUMS PO) Take 1 tablet by mouth daily as needed (acid reflux/indigestion).    [provider]  Cholecalciferol (VITAMIN D) 2000 units tablet Take 2,000 Units by mouth daily with breakfast.     [provider]  finasteride (PROSCAR) 5 MG tablet Take 1 tablet (5 mg total) by mouth daily. 06/17/22   Eustaquio Boyden, MD  Insulin Glargine [facility]) 100 UNIT/ML Inject 100 Units into the skin at bedtime. 06/10/20   Eustaquio Boyden, MD  Insulin Pen Needle (B-D ULTRAFINE III SHORT PEN) 31G X 8 MM MISC 1 Device by Other route 4 (four) times daily. USE AS DIRECTED DAILY 06/09/18   Romero Belling, MD  Iron, Ferrous Sulfate, 325 (65 Fe) MG TABS Take 325 mg by mouth every other day. 06/16/21   Eustaquio Boyden, MD  losartan (COZAAR) 25 MG tablet TAKE 1 TABLET (25 MG TOTAL) BY MOUTH DAILY. 03/19/22   [place], Peter M, MD  metFORMIN (GLUCOPHAGE) 1000 MG tablet Take 1 tablet (1,000 mg total) by mouth 2 (two) times daily with a meal. 06/17/22   Eustaquio Boyden, MD  metoprolol tartrate (LOPRESSOR) 25 MG tablet TAKE 0.5 TABLETS BY MOUTH 2 TIMES DAILY. Patient taking differently: Take 12.5 mg by mouth 2 (two) times daily. 10/16/21   [place], Peter M, MD  [facility] 10 MG/0.5ML Pen Inject into the skin. 06/02/22   [provider]  Multiple Vitamin (MULTIVITAMIN WITH MINERALS) TABS tablet Take 1 tablet by mouth daily with breakfast.      [provider]  nitroGLYCERIN (NITROSTAT) 0.4 MG SL tablet PLACE 1 TABLET UNDER THE TONGUE EVERY 5 MINUTES AS NEEDED FOR CHEST PAIN Patient taking differently: Place 0.4 mg under the tongue every 5 (five) minutes as needed for chest pain. 05/27/21   [place], Peter M, MD  [facility] ULTRA test strip USE TO CHECK SUGARS DAILY  AS DIRECTED 03/13/19   Eustaquio Boyden, MD  probenecid (BENEMID) 500 MG tablet Take 0.5 tablets (250 mg total) by mouth every other day. 06/17/22   Eustaquio Boyden, MD  rivaroxaban (XARELTO) 10 MG TABS tablet Take 1 tablet (10 mg total) by mouth daily. 06/17/22   Eustaquio Boyden, MD  rosuvastatin (CRESTOR) 40 MG tablet Take 1 tablet (40 mg total) by mouth daily with supper. 10/16/21   [place], Peter M, MD  tamsulosin (FLOMAX) 0.4 MG CAPS capsule Take 1 capsule (0.4 mg total) by mouth daily. 06/17/22   Eustaquio Boyden, MD  tirzepatide [facility]) 10 MG/0.5ML Pen Inject 10 mg into the skin once a week. 05/07/22  No current facility-administered medications for this encounter.   Current Outpatient Medications  Medication Sig Dispense Refill   acetaminophen (TYLENOL) 500 MG tablet Take 1,000 mg by mouth 2 (two) times daily as needed for headache (pain).     aspirin EC 81 MG tablet Take 81 mg by mouth daily with supper. Swallow whole.     Calcium Carbonate Antacid (TUMS PO) Take 1 tablet by mouth daily as needed (acid reflux/indigestion).     Cholecalciferol (VITAMIN D) 2000 units tablet Take 2,000 Units by mouth daily with breakfast.      finasteride (PROSCAR) 5 MG tablet Take 1 tablet (5 mg total) by mouth daily. 90 tablet 4   Insulin Glargine (BASAGLAR KWIKPEN) 100 UNIT/ML Inject 100 Units into the skin at bedtime.     Insulin Pen Needle (B-D ULTRAFINE III SHORT PEN) 31G X 8 MM MISC 1 Device by Other route 4 (four) times daily. USE AS DIRECTED DAILY 120 each 11   Iron, Ferrous Sulfate, 325 (65 Fe) MG TABS Take 325 mg by mouth every other day.     losartan (COZAAR)  25 MG tablet TAKE 1 TABLET (25 MG TOTAL) BY MOUTH DAILY. 90 tablet 1   metFORMIN (GLUCOPHAGE) 1000 MG tablet Take 1 tablet (1,000 mg total) by mouth 2 (two) times daily with a meal.     metoprolol tartrate (LOPRESSOR) 25 MG tablet TAKE 0.5 TABLETS BY MOUTH 2 TIMES DAILY. (Patient taking differently: Take 12.5 mg by mouth 2 (two) times daily.) 90 tablet 2   MOUNJARO 10 MG/0.5ML Pen Inject into the skin.     Multiple Vitamin (MULTIVITAMIN WITH MINERALS) TABS tablet Take 1 tablet by mouth daily with breakfast.      nitroGLYCERIN (NITROSTAT) 0.4 MG SL tablet PLACE 1 TABLET UNDER THE TONGUE EVERY 5 MINUTES AS NEEDED FOR CHEST PAIN (Patient taking differently: Place 0.4 mg under the tongue every 5 (five) minutes as needed for chest pain.) 25 tablet 11   ONETOUCH ULTRA test strip USE TO CHECK SUGARS DAILY  AS DIRECTED 100 strip 3   probenecid (BENEMID) 500 MG tablet Take 0.5 tablets (250 mg total) by mouth every other day. 45 tablet 2   rivaroxaban (XARELTO) 10 MG TABS tablet Take 1 tablet (10 mg total) by mouth daily. 30 tablet 11   rosuvastatin (CRESTOR) 40 MG tablet Take 1 tablet (40 mg total) by mouth daily with supper. 90 tablet 2   tamsulosin (FLOMAX) 0.4 MG CAPS capsule Take 1 capsule (0.4 mg total) by mouth daily. 90 capsule 4   tirzepatide (MOUNJARO) 10 MG/0.5ML Pen Inject 10 mg into the skin once a week. 2 mL 4    Allergies as of 10/08/2022   (No Known Allergies)    Review of Systems  Constitutional:  Negative for chills, fever and weight loss.  HENT:  Negative for hearing loss and tinnitus.   Eyes:  Negative for blurred vision and double vision.  Respiratory:  Negative for cough.   Cardiovascular:  Negative for chest pain and palpitations.  Gastrointestinal:  Positive for blood in stool, diarrhea, nausea and vomiting. Negative for abdominal pain, constipation and heartburn.  Genitourinary:  Negative for dysuria and urgency.  Musculoskeletal:  Negative for myalgias and neck pain.  Skin:   Negative for itching and rash.  Neurological:  Negative for seizures and loss of consciousness.  Psychiatric/Behavioral:  Negative for depression and suicidal ideas.        Physical Exam:  Vital signs in last 24 hours: Temp:  [98.4  F (36.9 C)] 98.4 F (36.9 C) (06/20 1109) Pulse Rate:  [85-90] 90 (06/20 1330) Resp:  [14-16] 14 (06/20 1330) BP: (126-136)/(71-79) 136/74 (06/20 1330) SpO2:  [98 %-100 %] 100 % (06/20 1330) Weight:  [81.6 kg] 81.6 kg (06/20 1133)   Last BM recorded by nurses in past 5 days No data recorded  Physical Exam Constitutional:      Appearance: He is well-developed. He is not ill-appearing.     Comments: Wife at bedside  HENT:     Head: Normocephalic and atraumatic.     Nose: Nose normal. No congestion.     Mouth/Throat:     Pharynx: Oropharynx is clear.  Eyes:     Extraocular Movements: Extraocular movements intact.     Conjunctiva/sclera: Conjunctivae normal.  Cardiovascular:     Rate and Rhythm: Normal rate and regular rhythm.  Pulmonary:     Effort: Pulmonary effort is normal. No respiratory distress.  Abdominal:     General: Abdomen is flat. Bowel sounds are normal. There is no distension.     Palpations: Abdomen is soft. There is no mass.     Tenderness: There is no abdominal tenderness. There is no guarding or rebound.     Hernia: No hernia is present.  Musculoskeletal:        General: No swelling. Normal range of motion.     Cervical back: Normal range of motion and neck supple.  Skin:    General: Skin is warm and dry.  Neurological:     General: No focal deficit present.     Mental Status: He is oriented to person, place, and time.  Psychiatric:        Mood and Affect: Mood normal.        Behavior: Behavior normal.        Thought Content: Thought content normal.        Judgment: Judgment normal.      LAB RESULTS: Recent Labs    10/08/22 1136  WBC 13.3*  HGB 16.5  HCT 48.0  PLT 183   BMET Recent Labs    10/08/22 1136   NA 139  K 4.6  CL 107  CO2 18*  GLUCOSE 123*  BUN 33*  CREATININE 1.55*  CALCIUM 9.8   LFT Recent Labs    10/08/22 1136  PROT 7.1  ALBUMIN 4.0  AST 51*  ALT 50*  ALKPHOS 42  BILITOT 1.2   PT/INR No results for input(s): "LABPROT", "INR" in the last 72 hours.  STUDIES: CT ABDOMEN PELVIS W CONTRAST  Result Date: 10/08/2022 CLINICAL DATA:  Nausea, vomiting, diarrhea. EXAM: CT ABDOMEN AND PELVIS WITH CONTRAST TECHNIQUE: Multidetector CT imaging of the abdomen and pelvis was performed using the standard protocol following bolus administration of intravenous contrast. RADIATION DOSE REDUCTION: This exam was performed according to the departmental dose-optimization program which includes automated exposure control, adjustment of the mA and/or kV according to patient size and/or use of iterative reconstruction technique. CONTRAST:  60mL OMNIPAQUE IOHEXOL 350 MG/ML SOLN COMPARISON:  July 29, 2022. FINDINGS: Lower chest: No acute abnormality. Hepatobiliary: No focal liver abnormality is seen. No gallstones, gallbladder wall thickening, or biliary dilatation. Pancreas: Unremarkable. No pancreatic ductal dilatation or surrounding inflammatory changes. Spleen: Normal in size without focal abnormality. Adrenals/Urinary Tract: Stable left adrenal adenoma. Right adrenal gland is unremarkable. No hydronephrosis or renal obstruction is noted. Urinary bladder is unremarkable. Stomach/Bowel: Stomach and appendix appear normal. There is no evidence of bowel obstruction. Moderate wall thickening of descending  colon is noted concerning for infectious or inflammatory colitis. Vascular/Lymphatic: Aortic atherosclerosis. No enlarged abdominal or pelvic lymph nodes. Reproductive: Mild prostatic enlargement. Other: No abdominal wall hernia or abnormality. No abdominopelvic ascites. Musculoskeletal: No acute or significant osseous findings. IMPRESSION: Moderate wall thickening of descending colon is noted concerning  for infectious or inflammatory colitis. Mild prostatic enlargement. Aortic Atherosclerosis (ICD10-I70.0). Electronically Signed   By: Lupita Raider M.D.   On: 10/08/2022 13:57      Impression    Nausea, vomiting, diarrhea CT abdomen pelvis with contrast shows moderate wall thickening of descending colon concerning for infectious versus inflammatory colitis.  No gallstones or gallbladder wall thickening.  Normal pancreas. Hgb 16.5 Leukocytosis with WBC 13.3 AST 51/ALT 50/alk phos 42 BUN 33, creatinine 1.55, GFR 48 Lactic acid 2.2 GI pathogen panel and C. difficile pending Acute onset nausea vomiting diarrhea with mild leukocytosis and CT scan concerning for colitis.  Less suspicious for ischemic colitis in the absence of abdominal pain.  Could also consider bleeding to be hemorrhoidal especially with history of constipation and straining.  Hemoglobin stable.   AKI on CKD - likely secondary to dehydration in the setting of recurrent diarrhea   Plan   -Await stool studies -Will start Cipro/Flagyl while waiting on stool studies -IVF for rehydration -Continue supportive care -Can have clear liquid diet from GI standpoint  Thank you for your kind consultation, we will continue to follow.   Dajai Wahlert Leanna Sato  10/08/2022, 2:28 PM

## 2022-10-08 NOTE — ED Triage Notes (Signed)
Pt. Stated, Alan Morrison had some stomach issues with blood in my stool with N/V/D since yesterday, not able to keep anything down.

## 2022-10-08 NOTE — ED Provider Notes (Signed)
[place] [facility] AT [facility] Provider Note   CSN: [id number] Arrival date & time: 10/08/22  1044     History  Chief Complaint  Patient presents with   Abdominal Pain   Nausea   Emesis   Diarrhea   Blood In Stools    Alan Morrison is a 72 y.o. male.  HPI 72 year old male presents with vomiting and diarrhea.  Symptoms started yesterday.  He had about 3 episodes of vomiting but has had 10-12 episodes of diarrhea, all of which have been bloody.  He denies a fever but was sweating last night.  He is also had some abdominal cramping/lower abdominal pain.  He is on Xarelto for DVT/PE. Pain is about 6/10 currently.  Home Medications Prior to Admission medications   Medication Sig Start Date End Date Taking? Authorizing Provider  acetaminophen (TYLENOL) 500 MG tablet Take 1,000 mg by mouth 2 (two) times daily as needed for headache (pain).   Yes [provider]  aspirin EC 81 MG tablet Take 81 mg by mouth daily with supper. Swallow whole.   Yes [provider]  Calcium Carbonate Antacid (TUMS PO) Take 1 tablet by mouth daily as needed (acid reflux/indigestion).   Yes [provider]  Cholecalciferol (VITAMIN D) 2000 units tablet Take 2,000 Units by mouth daily with breakfast.    Yes [provider]  finasteride (PROSCAR) 5 MG tablet Take 1 tablet (5 mg total) by mouth daily. 06/17/22  Yes Eustaquio Boyden, MD  Insulin Glargine [facility]) 100 UNIT/ML Inject 100 Units into the skin at bedtime. 06/10/20  Yes Eustaquio Boyden, MD  Iron, Ferrous Sulfate, 325 (65 Fe) MG TABS Take 325 mg by mouth every other day. 06/16/21  Yes Eustaquio Boyden, MD  losartan (COZAAR) 25 MG tablet TAKE 1 TABLET (25 MG TOTAL) BY MOUTH DAILY. 03/19/22  Yes [place], Peter M, MD  metFORMIN (GLUCOPHAGE) 1000 MG tablet Take 1 tablet (1,000 mg total) by mouth 2 (two) times daily with a meal. 06/17/22  Yes Eustaquio Boyden, MD  metoprolol tartrate  (LOPRESSOR) 25 MG tablet TAKE 0.5 TABLETS BY MOUTH 2 TIMES DAILY. Patient taking differently: Take 12.5 mg by mouth 2 (two) times daily. 10/16/21  Yes [place], Peter M, MD  Multiple Vitamin (MULTIVITAMIN WITH MINERALS) TABS tablet Take 1 tablet by mouth daily with breakfast.    Yes [provider]  probenecid (BENEMID) 500 MG tablet Take 0.5 tablets (250 mg total) by mouth every other day. 06/17/22  Yes Eustaquio Boyden, MD  rivaroxaban (XARELTO) 10 MG TABS tablet Take 1 tablet (10 mg total) by mouth daily. 06/17/22  Yes Eustaquio Boyden, MD  rosuvastatin (CRESTOR) 40 MG tablet Take 1 tablet (40 mg total) by mouth daily with supper. 10/16/21  Yes [place], Peter M, MD  tamsulosin (FLOMAX) 0.4 MG CAPS capsule Take 1 capsule (0.4 mg total) by mouth daily. 06/17/22  Yes Eustaquio Boyden, MD  tirzepatide [facility]) 10 MG/0.5ML Pen Inject 10 mg into the skin once a week. 05/07/22  Yes   Insulin Pen Needle (B-D ULTRAFINE III SHORT PEN) 31G X 8 MM MISC 1 Device by Other route 4 (four) times daily. USE AS DIRECTED DAILY 06/09/18   Romero Belling, MD  nitroGLYCERIN (NITROSTAT) 0.4 MG SL tablet PLACE 1 TABLET UNDER THE TONGUE EVERY 5 MINUTES AS NEEDED FOR CHEST PAIN Patient taking differently: Place 0.4 mg under the tongue every 5 (five) minutes as needed for chest pain. 05/27/21   [place], Peter M, MD  [facility]  ULTRA test strip USE TO CHECK SUGARS DAILY  AS DIRECTED 03/13/19   Eustaquio Boyden, MD      Allergies    Patient has no known allergies.    Review of Systems   Review of Systems  Constitutional:  Positive for chills and diaphoresis. Negative for fever.  Gastrointestinal:  Positive for abdominal pain, blood in stool, diarrhea, nausea and vomiting.    Physical Exam Updated Vital Signs BP 136/74   Pulse (!) 101   Temp 98.4 F (36.9 C)   Resp 14   Ht 5' 7.5" (1.715 m)   Wt 81.6 kg   SpO2 100%   BMI 27.78 kg/m  Physical Exam Vitals and nursing note reviewed.  Constitutional:       General: He is not in acute distress.    Appearance: He is well-developed. He is not ill-appearing or diaphoretic.  HENT:     Head: Normocephalic and atraumatic.  Cardiovascular:     Rate and Rhythm: Regular rhythm. Tachycardia present.     Heart sounds: Normal heart sounds.     Comments: HR low 100s Pulmonary:     Effort: Pulmonary effort is normal.     Breath sounds: Normal breath sounds.  Abdominal:     Palpations: Abdomen is soft.     Tenderness: There is abdominal tenderness (mild).  Skin:    General: Skin is warm and dry.  Neurological:     Mental Status: He is alert.     ED Results / Procedures / Treatments   Labs (all labs ordered are listed, but only abnormal results are displayed) Labs Reviewed  COMPREHENSIVE METABOLIC PANEL - Abnormal; Notable for the following components:      Result Value   CO2 18 (*)    Glucose, Bld 123 (*)    BUN 33 (*)    Creatinine, Ser 1.55 (*)    AST 51 (*)    ALT 50 (*)    GFR, Estimated 48 (*)    All other components within normal limits  CBC - Abnormal; Notable for the following components:   WBC 13.3 (*)    All other components within normal limits  URINALYSIS, ROUTINE W REFLEX MICROSCOPIC - Abnormal; Notable for the following components:   Color, Urine AMBER (*)    APPearance HAZY (*)    Ketones, ur 5 (*)    Protein, ur 30 (*)    Bacteria, UA FEW (*)    All other components within normal limits  LACTIC ACID, PLASMA - Abnormal; Notable for the following components:   Lactic Acid, Venous 2.2 (*)    All other components within normal limits  CBG MONITORING, ED - Abnormal; Notable for the following components:   Glucose-Capillary 115 (*)    All other components within normal limits  GASTROINTESTINAL PANEL BY PCR, STOOL (REPLACES STOOL CULTURE)  C DIFFICILE QUICK SCREEN W PCR REFLEX    LIPASE, BLOOD  LACTIC ACID, PLASMA  CBC    EKG None  Radiology CT ABDOMEN PELVIS W CONTRAST  Result Date: 10/08/2022 CLINICAL DATA:   Nausea, vomiting, diarrhea. EXAM: CT ABDOMEN AND PELVIS WITH CONTRAST TECHNIQUE: Multidetector CT imaging of the abdomen and pelvis was performed using the standard protocol following bolus administration of intravenous contrast. RADIATION DOSE REDUCTION: This exam was performed according to the departmental dose-optimization program which includes automated exposure control, adjustment of the mA and/or kV according to patient size and/or use of iterative reconstruction technique. CONTRAST:  60mL OMNIPAQUE IOHEXOL 350 MG/ML SOLN COMPARISON:  July 29, 2022. FINDINGS: Lower chest: No acute abnormality. Hepatobiliary: No focal liver abnormality is seen. No gallstones, gallbladder wall thickening, or biliary dilatation. Pancreas: Unremarkable. No pancreatic ductal dilatation or surrounding inflammatory changes. Spleen: Normal in size without focal abnormality. Adrenals/Urinary Tract: Stable left adrenal adenoma. Right adrenal gland is unremarkable. No hydronephrosis or renal obstruction is noted. Urinary bladder is unremarkable. Stomach/Bowel: Stomach and appendix appear normal. There is no evidence of bowel obstruction. Moderate wall thickening of descending colon is noted concerning for infectious or inflammatory colitis. Vascular/Lymphatic: Aortic atherosclerosis. No enlarged abdominal or pelvic lymph nodes. Reproductive: Mild prostatic enlargement. Other: No abdominal wall hernia or abnormality. No abdominopelvic ascites. Musculoskeletal: No acute or significant osseous findings. IMPRESSION: Moderate wall thickening of descending colon is noted concerning for infectious or inflammatory colitis. Mild prostatic enlargement. Aortic Atherosclerosis (ICD10-I70.0). Electronically Signed   By: Lupita Raider M.D.   On: 10/08/2022 13:57    Procedures Procedures    Medications Ordered in ED Medications  metroNIDAZOLE (FLAGYL) IVPB 500 mg (has no administration in time range)  ciprofloxacin (CIPRO) IVPB 400 mg (has  no administration in time range)  lactated ringers infusion (has no administration in time range)  acetaminophen (TYLENOL) tablet 650 mg (has no administration in time range)    Or  acetaminophen (TYLENOL) suppository 650 mg (has no administration in time range)  morphine (PF) 2 MG/ML injection 2 mg (has no administration in time range)  ondansetron (ZOFRAN) tablet 4 mg (has no administration in time range)    Or  ondansetron (ZOFRAN) injection 4 mg (has no administration in time range)  hydrALAZINE (APRESOLINE) injection 5 mg (has no administration in time range)  insulin aspart (novoLOG) injection 0-15 Units (has no administration in time range)  insulin aspart (novoLOG) injection 0-5 Units (has no administration in time range)  finasteride (PROSCAR) tablet 5 mg (has no administration in time range)  losartan (COZAAR) tablet 25 mg (has no administration in time range)  metoprolol tartrate (LOPRESSOR) tablet 12.5 mg (has no administration in time range)  rosuvastatin (CRESTOR) tablet 40 mg (has no administration in time range)  tamsulosin (FLOMAX) capsule 0.4 mg (has no administration in time range)  Basaglar KwikPen KwikPen 50 Units (has no administration in time range)  ondansetron (ZOFRAN-ODT) disintegrating tablet 4 mg (4 mg Oral Given 10/08/22 1139)  lactated ringers bolus 1,000 mL (0 mLs Intravenous Stopped 10/08/22 1425)  iohexol (OMNIPAQUE) 350 MG/ML injection 60 mL (60 mLs Intravenous Contrast Given 10/08/22 1339)    ED Course/ Medical Decision Making/ A&P                             Medical Decision Making Amount and/or Complexity of Data Reviewed Labs: ordered.    Details: Lactate 2.2.  White blood cells 13.  Mild LFT dysfunction. Radiology: ordered and independent interpretation performed.    Details: Colitis.  Risk Prescription drug management. Decision regarding hospitalization.   Patient is found to have colitis causing rectal bleeding.  Was hemoglobin is normal,  he is continuing to have some bleeding and after discussion with [facility], would be best to put him on Cipro, Flagyl, and do supportive care with fluids and observe in the hospital.  They will see in consultation.  Discussed with Dr. Ophelia Charter who will admit.  I have sent GI pathogen studies as well as C. difficile testing.        Final Clinical Impression(s) / ED Diagnoses Final diagnoses:  Colitis    Rx / DC Orders ED Discharge Orders     None         Pricilla Loveless, MD 10/08/22 1600

## 2022-10-09 DIAGNOSIS — N138 Other obstructive and reflux uropathy: Secondary | ICD-10-CM | POA: Diagnosis not present

## 2022-10-09 DIAGNOSIS — R112 Nausea with vomiting, unspecified: Secondary | ICD-10-CM

## 2022-10-09 DIAGNOSIS — E278 Other specified disorders of adrenal gland: Secondary | ICD-10-CM | POA: Diagnosis not present

## 2022-10-09 DIAGNOSIS — I252 Old myocardial infarction: Secondary | ICD-10-CM | POA: Diagnosis not present

## 2022-10-09 DIAGNOSIS — Z7982 Long term (current) use of aspirin: Secondary | ICD-10-CM | POA: Diagnosis not present

## 2022-10-09 DIAGNOSIS — K648 Other hemorrhoids: Secondary | ICD-10-CM | POA: Diagnosis not present

## 2022-10-09 DIAGNOSIS — E860 Dehydration: Secondary | ICD-10-CM | POA: Diagnosis not present

## 2022-10-09 DIAGNOSIS — I2510 Atherosclerotic heart disease of native coronary artery without angina pectoris: Secondary | ICD-10-CM | POA: Diagnosis not present

## 2022-10-09 DIAGNOSIS — E785 Hyperlipidemia, unspecified: Secondary | ICD-10-CM | POA: Diagnosis present

## 2022-10-09 DIAGNOSIS — Z794 Long term (current) use of insulin: Secondary | ICD-10-CM | POA: Diagnosis not present

## 2022-10-09 DIAGNOSIS — N179 Acute kidney failure, unspecified: Secondary | ICD-10-CM | POA: Diagnosis not present

## 2022-10-09 DIAGNOSIS — I129 Hypertensive chronic kidney disease with stage 1 through stage 4 chronic kidney disease, or unspecified chronic kidney disease: Secondary | ICD-10-CM | POA: Diagnosis not present

## 2022-10-09 DIAGNOSIS — R918 Other nonspecific abnormal finding of lung field: Secondary | ICD-10-CM | POA: Diagnosis present

## 2022-10-09 DIAGNOSIS — Z86718 Personal history of other venous thrombosis and embolism: Secondary | ICD-10-CM | POA: Diagnosis not present

## 2022-10-09 DIAGNOSIS — N189 Chronic kidney disease, unspecified: Secondary | ICD-10-CM | POA: Diagnosis not present

## 2022-10-09 DIAGNOSIS — E1122 Type 2 diabetes mellitus with diabetic chronic kidney disease: Secondary | ICD-10-CM | POA: Diagnosis not present

## 2022-10-09 DIAGNOSIS — Z7985 Long-term (current) use of injectable non-insulin antidiabetic drugs: Secondary | ICD-10-CM | POA: Diagnosis not present

## 2022-10-09 DIAGNOSIS — K760 Fatty (change of) liver, not elsewhere classified: Secondary | ICD-10-CM | POA: Diagnosis not present

## 2022-10-09 DIAGNOSIS — K529 Noninfective gastroenteritis and colitis, unspecified: Secondary | ICD-10-CM | POA: Diagnosis not present

## 2022-10-09 DIAGNOSIS — E119 Type 2 diabetes mellitus without complications: Secondary | ICD-10-CM | POA: Diagnosis not present

## 2022-10-09 DIAGNOSIS — G4733 Obstructive sleep apnea (adult) (pediatric): Secondary | ICD-10-CM | POA: Diagnosis present

## 2022-10-09 DIAGNOSIS — E1169 Type 2 diabetes mellitus with other specified complication: Secondary | ICD-10-CM | POA: Diagnosis not present

## 2022-10-09 DIAGNOSIS — K559 Vascular disorder of intestine, unspecified: Secondary | ICD-10-CM | POA: Diagnosis not present

## 2022-10-09 DIAGNOSIS — I10 Essential (primary) hypertension: Secondary | ICD-10-CM | POA: Diagnosis not present

## 2022-10-09 DIAGNOSIS — Z955 Presence of coronary angioplasty implant and graft: Secondary | ICD-10-CM | POA: Diagnosis not present

## 2022-10-09 DIAGNOSIS — Z87891 Personal history of nicotine dependence: Secondary | ICD-10-CM | POA: Diagnosis not present

## 2022-10-09 DIAGNOSIS — G4730 Sleep apnea, unspecified: Secondary | ICD-10-CM | POA: Diagnosis not present

## 2022-10-09 DIAGNOSIS — Z7984 Long term (current) use of oral hypoglycemic drugs: Secondary | ICD-10-CM | POA: Diagnosis not present

## 2022-10-09 DIAGNOSIS — Z7901 Long term (current) use of anticoagulants: Secondary | ICD-10-CM | POA: Diagnosis not present

## 2022-10-09 DIAGNOSIS — Z86711 Personal history of pulmonary embolism: Secondary | ICD-10-CM | POA: Diagnosis not present

## 2022-10-09 DIAGNOSIS — K625 Hemorrhage of anus and rectum: Secondary | ICD-10-CM | POA: Diagnosis not present

## 2022-10-09 LAB — GLUCOSE, CAPILLARY
Glucose-Capillary: 121 mg/dL — ABNORMAL HIGH (ref 70–99)
Glucose-Capillary: 120 mg/dL — ABNORMAL HIGH (ref 70–99)
Glucose-Capillary: 75 mg/dL (ref 70–99)
Glucose-Capillary: 73 mg/dL (ref 70–99)

## 2022-10-09 LAB — GASTROINTESTINAL PANEL BY PCR, STOOL (REPLACES STOOL CULTURE)

## 2022-10-09 LAB — BASIC METABOLIC PANEL
Anion gap: 7 (ref 5–15)
BUN: 23 mg/dL (ref 8–23)
CO2: 23 mmol/L (ref 22–32)
Calcium: 8.7 mg/dL — ABNORMAL LOW (ref 8.9–10.3)
Chloride: 108 mmol/L (ref 98–111)
Creatinine, Ser: 1.46 mg/dL — ABNORMAL HIGH (ref 0.61–1.24)
GFR, Estimated: 51 mL/min — ABNORMAL LOW (ref >60)
Glucose, Bld: 79 mg/dL (ref 70–99)
Potassium: 3.8 mmol/L (ref 3.5–5.1)
Sodium: 138 mmol/L (ref 135–145)

## 2022-10-09 LAB — CBC
HCT: 37.2 % — ABNORMAL LOW (ref 39.0–52.0)
Hemoglobin: 13.2 g/dL (ref 13.0–17.0)
MCH: 31.1 pg (ref 26.0–34.0)
MCHC: 35.5 g/dL (ref 30.0–36.0)
MCV: 87.5 fL (ref 80.0–100.0)
Platelets: 150 10*3/uL (ref 150–400)
RBC: 4.25 MIL/uL (ref 4.22–5.81)
RDW: 13.2 % (ref 11.5–15.5)
WBC: 10.2 10*3/uL (ref 4.0–10.5)
nRBC: 0 % (ref 0.0–0.2)

## 2022-10-09 MED ORDER — PEG-KCL-NACL-NASULF-NA ASC-C 100 G PO SOLR: 1.0000 | Freq: Once | ORAL | Status: DC

## 2022-10-09 MED ORDER — PEG-KCL-NACL-NASULF-NA ASC-C 100 G PO SOLR
0.5000 | Freq: Once | ORAL | Status: AC
  Administered 2022-10-09: 100 g via ORAL
  Filled 2022-10-09 (×2): qty 1

## 2022-10-09 MED ORDER — PEG-KCL-NACL-NASULF-NA ASC-C 100 G PO SOLR
0.5000 | Freq: Once | ORAL | Status: AC
  Administered 2022-10-10: 100 g via ORAL

## 2022-10-09 MED ORDER — METRONIDAZOLE 500 MG/100ML IV SOLN: 500.0000 mg | Freq: Two times a day (BID) | INTRAVENOUS | Status: DC

## 2022-10-09 MED ORDER — INSULIN GLARGINE-YFGN 100 UNIT/ML ~~LOC~~ SOLN
30.0000 [IU] | Freq: Every day | SUBCUTANEOUS | Status: DC
  Administered 2022-10-10: 30 [IU] via SUBCUTANEOUS
  Filled 2022-10-09 (×3): qty 0.3

## 2022-10-09 NOTE — Care Management Obs Status (Cosign Needed)
MEDICARE OBSERVATION STATUS NOTIFICATION   Patient Details  Name: Alan Morrison MRN: [id number] Date of Birth: 02-Apr-1951   Medicare Observation Status Notification Given:  Yes    Janae Bridgeman, RN 10/09/2022, 1:54 PM

## 2022-10-09 NOTE — Progress Notes (Signed)
   10/09/22 2041  BiPAP/CPAP/SIPAP  Reason BIPAP/CPAP not in use Non-compliant   Pt refused CPAP for night time use.

## 2022-10-09 NOTE — Progress Notes (Signed)
[facility] Progress Note  CC:     Nausea, vomiting, diarrhea   Subjective: No nausea or vomiting today.  Remains on a clear liquid diet.  He had 1 episode of dark red bloody watery diarrhea today.  He continues to have mild left lower quadrant abdominal pain which radiates to the central lower abdomen.   Objective:  Vital signs in last 24 hours: Temp:  [97.7 F (36.5 C)-99.3 F (37.4 C)] 98.2 F (36.8 C) (06/21 0802) Pulse Rate:  [76-101] 76 (06/21 0802) Resp:  [14-18] 18 (06/21 0507) BP: (106-138)/(62-78) 119/73 (06/21 0802) SpO2:  [97 %-100 %] 97 % (06/21 0802) Last BM Date : 10/08/22 General: 72 year old male in no acute distress. Heart: Regular rate and rhythm, no murmurs. Pulm: Breath sounds clear throughout. Abdomen: Abdomen soft, nondistended.  Mild tenderness to the LLQ and central lower abdomen without rebound or guarding.  Positive bowel sounds to all 4 quadrants. Extremities: No lower extremity edema. Neurologic:  Alert and  oriented x 4. Grossly normal neurologically. Psych:  Alert and cooperative. Normal mood and affect.  Intake/Output from previous day: 06/20 0701 - 06/21 0700 In: 1340.3 [I.V.:1085; IV Piggyback:255.3] Out: -  Intake/Output this shift: No intake/output data recorded.  Lab Results: Recent Labs    10/08/22 1136 10/08/22 1644 10/09/22 0551  WBC 13.3* 11.9* 10.2  HGB 16.5 15.5 13.2  HCT 48.0 44.2 37.2*  PLT 183 196 150   BMET Recent Labs    10/08/22 1136 10/09/22 0551  NA 139 138  K 4.6 3.8  CL 107 108  CO2 18* 23  GLUCOSE 123* 79  BUN 33* 23  CREATININE 1.55* 1.46*  CALCIUM 9.8 8.7*   LFT Recent Labs    10/08/22 1136  PROT 7.1  ALBUMIN 4.0  AST 51*  ALT 50*  ALKPHOS 42  BILITOT 1.2   PT/INR No results for input(s): "LABPROT", "INR" in the last 72 hours. Hepatitis Panel No results for input(s): "HEPBSAG", "HCVAB", "HEPAIGM", "HEPBIGM" in the last 72 hours.  CT ABDOMEN PELVIS W CONTRAST  Result  Date: 10/08/2022 CLINICAL DATA:  Nausea, vomiting, diarrhea. EXAM: CT ABDOMEN AND PELVIS WITH CONTRAST TECHNIQUE: Multidetector CT imaging of the abdomen and pelvis was performed using the standard protocol following bolus administration of intravenous contrast. RADIATION DOSE REDUCTION: This exam was performed according to the departmental dose-optimization program which includes automated exposure control, adjustment of the mA and/or kV according to patient size and/or use of iterative reconstruction technique. CONTRAST:  60mL OMNIPAQUE IOHEXOL 350 MG/ML SOLN COMPARISON:  July 29, 2022. FINDINGS: Lower chest: No acute abnormality. Hepatobiliary: No focal liver abnormality is seen. No gallstones, gallbladder wall thickening, or biliary dilatation. Pancreas: Unremarkable. No pancreatic ductal dilatation or surrounding inflammatory changes. Spleen: Normal in size without focal abnormality. Adrenals/Urinary Tract: Stable left adrenal adenoma. Right adrenal gland is unremarkable. No hydronephrosis or renal obstruction is noted. Urinary bladder is unremarkable. Stomach/Bowel: Stomach and appendix appear normal. There is no evidence of bowel obstruction. Moderate wall thickening of descending colon is noted concerning for infectious or inflammatory colitis. Vascular/Lymphatic: Aortic atherosclerosis. No enlarged abdominal or pelvic lymph nodes. Reproductive: Mild prostatic enlargement. Other: No abdominal wall hernia or abnormality. No abdominopelvic ascites. Musculoskeletal: No acute or significant osseous findings. IMPRESSION: Moderate wall thickening of descending colon is noted concerning for infectious or inflammatory colitis. Mild prostatic enlargement. Aortic Atherosclerosis (ICD10-I70.0). Electronically Signed   By: Lupita Raider M.D.   On: 10/08/2022 13:57    Assessment / Plan:  72 y.o. male with past medical history significant for type 2 diabetes on insulin, hypertension, CAD s/p MI with DES 10/2019,  DVT/PE on Xarelto, hyperlipidemia, OSA, obesity, GERD, DVT admitted to the hospital 10/08/2022 with nausea, vomiting, and diarrhea. CT abdomen pelvis with contrast shows moderate wall thickening of descending colon concerning for infectious versus inflammatory colitis.  Normal liver, gallbladder and pancreas. Hgb 16.5 -> 15.5 -> today Hg 13.2. WBC 13.3 -> 10.2. AST 51/ALT 50 GI pathogen panel negative. C. Diff antigen and C. Diff toxin negative.  -DC enteric precautions -Continue Cipro and Flagyl  IV -Clear liquid diet -IV fluids and pain management per the hospitalist -Continue to hold Xarelto -Consider a diagnostic colonoscopy during this hospital admission, await further recommendations per Dr. Leonides Schanz  AKI, secondary to dehydration in the setting of recurrent diarrhea. Cr 1.53 -> 1.46.  Hyperplastic colon polyp removed from the colon per colonoscopy 2015    Principal Problem:   Colitis with rectal bleeding Active Problems:   Type 2 diabetes mellitus with chronic kidney disease, with long-term current use of insulin (HCC)   Coronary artery disease   Benign prostatic hyperplasia with urinary obstruction   Sleep apnea   Essential hypertension, benign   Hyperlipidemia associated with type 2 diabetes mellitus (HCC)     LOS: 0 days   Alan Morrison  10/09/2022, 4:05PM

## 2022-10-09 NOTE — Progress Notes (Signed)
PROGRESS NOTE   Lenzy Kerschner  ZOX:[id number] DOB: April 15, 1951 DOA: 10/08/2022 PCP: Eustaquio Boyden, MD  Brief Narrative:  72 y/o home dwell wm CAD STEMI 11/12/2019 + PCI--H/oh stress cardiomyopathy 2000-on aspirin DM TY 2+ vascular complications Admission for pyelonephritis 2003 History of DVT 2020-recurrent acute DVT 01/20/2022 status post mechanical thrombectomy R popliteal superficial femoral, femoral iliac and distal IVC-found to have acute PE with significant hypotension RV moderately enlarged at the time Known 1.4 cm indeterminate left adrenal nodule, 5.3 mm R LL lung  prior colonoscopy 07/2013 hyperplastic polyp sigmoid colon  Presented to the emergency room 6/27-15 episodes of bloody stool-white count 13.3 lactic acid mildly elevated CT = colitis C. difficile negative GI pathogen panel pending   Hospital-Problem based course  Colitis Continue Cipro 500 twice daily Flagyl IVF 100 cc/h--pain control Tylenol/morphine 2 mg every 2 as needed-defer diet etc. to GI Seems like patient needs an outpatient colonoscopy  CAD At this time holding Xarelto 10 daily aspirin 81 given blood in stool Continue losartan 25 metoprolol 12.5 twice daily Crestor 40 etc.  Prior recurrent PE with DVT as above status post thrombectomy's Resume anticoagulation as per GI  BPH Continue finasteride 5 daily, Flomax 0.4 daily  DM TY 2 Metformin 1000 twice daily held  CBGs ranging 75-1 20--- would cut back Lantus tonight to only 30 units (takes 100 at home)  Indeterminant adrenal nodules and nodules in right lung Needs outpatient characterization with repeat scan   DVT prophylaxis: SCD Code Status: Full Family Communication: None at bedside Disposition:  Status is: Observation The patient will require care spanning > 2 midnights and should be moved to inpatient because:   Needs further monitoring in the hospital    Subjective: 1 episode of diarrhea which seems a little bloody today Mild  nausea no vomiting Tolerating clears No other issue  Objective: Vitals:   10/08/22 1515 10/08/22 1702 10/08/22 1959 10/09/22 0507  BP:  138/78 136/75 106/62  Pulse: (!) 101 95 88 88  Resp: 14 17 18 18   Temp:  97.7 F (36.5 C) 98.2 F (36.8 C) 99.3 F (37.4 C)  TempSrc:  Oral    SpO2: 100% 97% 99% 97%  Weight:      Height:        Intake/Output Summary (Last 24 hours) at 10/09/2022 0647 Last data filed at 10/09/2022 0547 Gross per 24 hour  Intake 1340.26 ml  Output --  Net 1340.26 ml   Filed Weights   10/08/22 1133  Weight: 81.6 kg    Examination:  EOMI NCAT no focal deficit Moderate dentition no icterus no pallor Chest is clear no wheeze rales rhonchi S1-S2 no murmur Abdomen slightly distended no rebound no guarding even to deep palpation No lower extremity edema Moving all 4 limbs equally rest of neuroexam deferred  Data Reviewed: personally reviewed   CBC    Component Value Date/Time   WBC 10.2 10/09/2022 0551   RBC 4.25 10/09/2022 0551   HGB 13.2 10/09/2022 0551   HGB 14.6 06/28/2018 0849   HGB 15.6 03/25/2017 1225   HCT 37.2 (L) 10/09/2022 0551   HCT 45.8 03/25/2017 1225   PLT 150 10/09/2022 0551   PLT 200 06/28/2018 0849   PLT 228 03/25/2017 1225   MCV 87.5 10/09/2022 0551   MCV 90 03/25/2017 1225   MCH 31.1 10/09/2022 0551   MCHC 35.5 10/09/2022 0551   RDW 13.2 10/09/2022 0551   RDW 13.6 03/25/2017 1225   LYMPHSABS 1.6 06/10/2022 0726  MONOABS 0.4 06/10/2022 0726   EOSABS 0.2 06/10/2022 0726   BASOSABS 0.0 06/10/2022 0726      Latest Ref Rng & Units 10/09/2022    5:51 AM 10/08/2022   11:36 AM 06/10/2022    7:26 AM  CMP  Glucose 70 - 99 mg/dL 79  161  93   BUN 8 - 23 mg/dL 23  33  36   Creatinine 0.61 - 1.24 mg/dL 0.96  0.45  4.09   Sodium 135 - 145 mmol/L 138  139  145   Potassium 3.5 - 5.1 mmol/L 3.8  4.6  5.1   Chloride 98 - 111 mmol/L 108  107  109   CO2 22 - 32 mmol/L 23  18  25    Calcium 8.9 - 10.3 mg/dL 8.7  9.8  81.1   Total  Protein 6.5 - 8.1 g/dL  7.1  6.8   Total Bilirubin 0.3 - 1.2 mg/dL  1.2  0.5   Alkaline Phos 38 - 126 U/L  42  40   AST 15 - 41 U/L  51  30   ALT 0 - 44 U/L  50  35      Radiology Studies: CT ABDOMEN PELVIS W CONTRAST  Result Date: 10/08/2022 CLINICAL DATA:  Nausea, vomiting, diarrhea. EXAM: CT ABDOMEN AND PELVIS WITH CONTRAST TECHNIQUE: Multidetector CT imaging of the abdomen and pelvis was performed using the standard protocol following bolus administration of intravenous contrast. RADIATION DOSE REDUCTION: This exam was performed according to the departmental dose-optimization program which includes automated exposure control, adjustment of the mA and/or kV according to patient size and/or use of iterative reconstruction technique. CONTRAST:  60mL OMNIPAQUE IOHEXOL 350 MG/ML SOLN COMPARISON:  July 29, 2022. FINDINGS: Lower chest: No acute abnormality. Hepatobiliary: No focal liver abnormality is seen. No gallstones, gallbladder wall thickening, or biliary dilatation. Pancreas: Unremarkable. No pancreatic ductal dilatation or surrounding inflammatory changes. Spleen: Normal in size without focal abnormality. Adrenals/Urinary Tract: Stable left adrenal adenoma. Right adrenal gland is unremarkable. No hydronephrosis or renal obstruction is noted. Urinary bladder is unremarkable. Stomach/Bowel: Stomach and appendix appear normal. There is no evidence of bowel obstruction. Moderate wall thickening of descending colon is noted concerning for infectious or inflammatory colitis. Vascular/Lymphatic: Aortic atherosclerosis. No enlarged abdominal or pelvic lymph nodes. Reproductive: Mild prostatic enlargement. Other: No abdominal wall hernia or abnormality. No abdominopelvic ascites. Musculoskeletal: No acute or significant osseous findings. IMPRESSION: Moderate wall thickening of descending colon is noted concerning for infectious or inflammatory colitis. Mild prostatic enlargement. Aortic Atherosclerosis  (ICD10-I70.0). Electronically Signed   By: Lupita Raider M.D.   On: 10/08/2022 13:57     Scheduled Meds:  finasteride  5 mg Oral Daily   insulin aspart  0-15 Units Subcutaneous TID WC   insulin aspart  0-5 Units Subcutaneous QHS   insulin glargine-yfgn  50 Units Subcutaneous QHS   losartan  25 mg Oral Daily   metoprolol tartrate  12.5 mg Oral BID   rosuvastatin  40 mg Oral Q supper   tamsulosin  0.4 mg Oral Daily   Continuous Infusions:  ciprofloxacin 400 mg (10/09/22 9147)   lactated ringers 100 mL/hr at 10/09/22 0547   metronidazole 100 mL/hr at 10/09/22 0547     LOS: 0 days   Time spent: 42  Rhetta Mura, MD [facility] To contact the attending provider between 7A-7P or the covering provider during after hours 7P-7A, please log into the web site [email or web address] and access using universal [place] password  for that web site. If you do not have the password, please call the hospital operator.  10/09/2022, 6:47 AM

## 2022-10-09 NOTE — H&P (View-Only) (Signed)
   [facility] Progress Note  CC:     Nausea, vomiting, diarrhea   Subjective: No nausea or vomiting today.  Remains on a clear liquid diet.  He had 1 episode of dark red bloody watery diarrhea today.  He continues to have mild left lower quadrant abdominal pain which radiates to the central lower abdomen.   Objective:  Vital signs in last 24 hours: Temp:  [97.7 F (36.5 C)-99.3 F (37.4 C)] 98.2 F (36.8 C) (06/21 0802) Pulse Rate:  [76-101] 76 (06/21 0802) Resp:  [14-18] 18 (06/21 0507) BP: (106-138)/(62-78) 119/73 (06/21 0802) SpO2:  [97 %-100 %] 97 % (06/21 0802) Last BM Date : 10/08/22 General: 72-year-old male in no acute distress. Heart: Regular rate and rhythm, no murmurs. Pulm: Breath sounds clear throughout. Abdomen: Abdomen soft, nondistended.  Mild tenderness to the LLQ and central lower abdomen without rebound or guarding.  Positive bowel sounds to all 4 quadrants. Extremities: No lower extremity edema. Neurologic:  Alert and  oriented x 4. Grossly normal neurologically. Psych:  Alert and cooperative. Normal mood and affect.  Intake/Output from previous day: 06/20 0701 - 06/21 0700 In: 1340.3 [I.V.:1085; IV Piggyback:255.3] Out: -  Intake/Output this shift: No intake/output data recorded.  Lab Results: Recent Labs    10/08/22 1136 10/08/22 1644 10/09/22 0551  WBC 13.3* 11.9* 10.2  HGB 16.5 15.5 13.2  HCT 48.0 44.2 37.2*  PLT 183 196 150   BMET Recent Labs    10/08/22 1136 10/09/22 0551  NA 139 138  K 4.6 3.8  CL 107 108  CO2 18* 23  GLUCOSE 123* 79  BUN 33* 23  CREATININE 1.55* 1.46*  CALCIUM 9.8 8.7*   LFT Recent Labs    10/08/22 1136  PROT 7.1  ALBUMIN 4.0  AST 51*  ALT 50*  ALKPHOS 42  BILITOT 1.2   PT/INR No results for input(s): "LABPROT", "INR" in the last 72 hours. Hepatitis Panel No results for input(s): "HEPBSAG", "HCVAB", "HEPAIGM", "HEPBIGM" in the last 72 hours.  CT ABDOMEN PELVIS W CONTRAST  Result  Date: 10/08/2022 CLINICAL DATA:  Nausea, vomiting, diarrhea. EXAM: CT ABDOMEN AND PELVIS WITH CONTRAST TECHNIQUE: Multidetector CT imaging of the abdomen and pelvis was performed using the standard protocol following bolus administration of intravenous contrast. RADIATION DOSE REDUCTION: This exam was performed according to the departmental dose-optimization program which includes automated exposure control, adjustment of the mA and/or kV according to patient size and/or use of iterative reconstruction technique. CONTRAST:  60mL OMNIPAQUE IOHEXOL 350 MG/ML SOLN COMPARISON:  July 29, 2022. FINDINGS: Lower chest: No acute abnormality. Hepatobiliary: No focal liver abnormality is seen. No gallstones, gallbladder wall thickening, or biliary dilatation. Pancreas: Unremarkable. No pancreatic ductal dilatation or surrounding inflammatory changes. Spleen: Normal in size without focal abnormality. Adrenals/Urinary Tract: Stable left adrenal adenoma. Right adrenal gland is unremarkable. No hydronephrosis or renal obstruction is noted. Urinary bladder is unremarkable. Stomach/Bowel: Stomach and appendix appear normal. There is no evidence of bowel obstruction. Moderate wall thickening of descending colon is noted concerning for infectious or inflammatory colitis. Vascular/Lymphatic: Aortic atherosclerosis. No enlarged abdominal or pelvic lymph nodes. Reproductive: Mild prostatic enlargement. Other: No abdominal wall hernia or abnormality. No abdominopelvic ascites. Musculoskeletal: No acute or significant osseous findings. IMPRESSION: Moderate wall thickening of descending colon is noted concerning for infectious or inflammatory colitis. Mild prostatic enlargement. Aortic Atherosclerosis (ICD10-I70.0). Electronically Signed   By: James  Green Jr M.D.   On: 10/08/2022 13:57    Assessment / Plan:    72 y.o. male with past medical history significant for type 2 diabetes on insulin, hypertension, CAD s/p MI with DES 10/2019,  DVT/PE on Xarelto, hyperlipidemia, OSA, obesity, GERD, DVT admitted to the hospital 10/08/2022 with nausea, vomiting, and diarrhea. CT abdomen pelvis with contrast shows moderate wall thickening of descending colon concerning for infectious versus inflammatory colitis.  Normal liver, gallbladder and pancreas. Hgb 16.5 -> 15.5 -> today Hg 13.2. WBC 13.3 -> 10.2. AST 51/ALT 50 GI pathogen panel negative. C. Diff antigen and C. Diff toxin negative.  -DC enteric precautions -Continue Cipro and Flagyl  IV -Clear liquid diet -IV fluids and pain management per the hospitalist -Continue to hold Xarelto -Consider a diagnostic colonoscopy during this hospital admission, await further recommendations per Dr. Dorsey  AKI, secondary to dehydration in the setting of recurrent diarrhea. Cr 1.53 -> 1.46.  Hyperplastic colon polyp removed from the colon per colonoscopy 2015    Principal Problem:   Colitis with rectal bleeding Active Problems:   Type 2 diabetes mellitus with chronic kidney disease, with long-term current use of insulin (HCC)   Coronary artery disease   Benign prostatic hyperplasia with urinary obstruction   Sleep apnea   Essential hypertension, benign   Hyperlipidemia associated with type 2 diabetes mellitus (HCC)     LOS: 0 days   Quanasia Defino M Kennedy-Smith  10/09/2022, 4:05PM    

## 2022-10-09 NOTE — TOC Initial Note (Signed)
Transition of Care [facility]) - Initial/Assessment Note    Patient Details  Name: Alan Morrison MRN: [id number] Date of Birth: [birth year]  [facility] CM/SW Contact:    Janae Bridgeman, RN Phone Number: 10/09/2022, 2:05 PM  Clinical Narrative:                 CM met with the patient at the bedside to provide patient with Medicare Observation notice.  Patient has no TOC needs and should likely go home with family once medically stable to discharge.  Expected Discharge Plan: Home/Self Care Barriers to Discharge: Continued Medical Work up   Patient Goals and CMS Choice Patient states their goals for this hospitalization and ongoing recovery are:: To return home CMS Medicare.gov Compare Post Acute Care list provided to:: Patient Choice offered to / list presented to : Patient [place] ownership interest in [facility].provided to:: Patient    Expected Discharge Plan and Services     Post Acute Care Choice: Resumption of Svcs/PTA Provider Living arrangements for the past 2 months: Single Family Home                                      Prior Living Arrangements/Services Living arrangements for the past 2 months: Single Family Home Lives with:: Spouse Patient language and need for interpreter reviewed:: Yes Do you feel safe going back to the place where you live?: Yes      Need for Family Participation in Patient Care: Yes (Comment) Care giver support system in place?: Yes (comment) Current home services: DME (CPAP at home (room air)) Criminal Activity/Legal Involvement Pertinent to Current Situation/Hospitalization: No - Comment as needed  Activities of Daily Living Home Assistive Devices/Equipment: Eyeglasses ADL Screening (condition at time of admission) Patient's cognitive ability adequate to safely complete daily activities?: Yes Is the patient deaf or have difficulty hearing?: No Does the patient have difficulty seeing, even when  wearing glasses/contacts?: No Does the patient have difficulty concentrating, remembering, or making decisions?: No Patient able to express need for assistance with ADLs?: Yes Does the patient have difficulty dressing or bathing?: No Independently performs ADLs?: Yes (appropriate for developmental age) Does the patient have difficulty walking or climbing stairs?: No Weakness of Legs: None Weakness of Arms/Hands: None  Permission Sought/Granted Permission sought to share information with : Case Manager, Family Supports Permission granted to share information with : Yes, Verbal Permission Granted              Emotional Assessment Appearance:: Appears stated age Attitude/Demeanor/Rapport: Gracious Affect (typically observed): Accepting Orientation: : Oriented to Self, Oriented to Place, Oriented to  Time, Oriented to Situation Alcohol / Substance Use: Never Used (Occasional beer - only socially) Psych Involvement: No (comment)  Admission diagnosis:  Colitis [K52.9] Colitis with rectal bleeding [K52.9, K62.5] Patient Active Problem List   Diagnosis Date Noted   Colitis with rectal bleeding 10/08/2022   Adrenal adenoma, left 02/05/2022   Right lower lobe pulmonary nodule 02/05/2022   Acute deep vein thrombosis (DVT) of iliac vein of right lower extremity (HCC) 01/29/2022   Pain in both lower extremities 01/15/2022   History of recurrent deep vein thrombosis (DVT) 01/03/2022   Poliosis 10/14/2021   Skin lesion 10/14/2021   Other chest pain 06/17/2021   Right ankle pain 06/17/2021   Advanced directives, counseling/discussion 02/02/2019   GERD (gastroesophageal reflux disease)    Cervical spondylosis  without myelopathy    Anemia, iron deficiency, inadequate dietary intake    Angina pectoris (HCC) 04/01/2017   Right hip pain 06/09/2016   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   Fatty liver 02/19/2016   Hyperuricemia    Ex-smoker    History of MRSA infection 12/19/2013   BPV  (benign positional vertigo) 12/13/2013   Hyperlipidemia associated with type 2 diabetes mellitus (HCC)    Vitamin D deficiency 05/01/2012   Obesity, Class I, BMI 30-34.9 06/08/2011   Health maintenance examination 05/06/2011   Choroidal nevus 01/22/2011   Essential hypertension, benign 06/12/2010   PALPITATIONS, OCCASIONAL 04/30/2010   TESTICULAR HYPOFUNCTION 03/11/2009   Benign prostatic hyperplasia with urinary obstruction 02/04/2009   Sleep apnea 02/04/2009   HEART MURMUR, HX OF 12/15/2008   ARTHROSCOPY, KNEE, HX OF 12/15/2008   Type 2 diabetes mellitus with chronic kidney disease, with long-term current use of insulin (HCC) 10/18/2006   Coronary artery disease 12/12/2001   Stress-induced cardiomyopathy 09/27/1998   PCP:  Eustaquio Boyden, MD Pharmacy:   [facility] [phone] [place], [place] - [address] [place] [address] Phone: [phone] Fax: [phone]  [id number] - [place], [place] - [address] AT [facility] [address] [place] [address] Phone: [phone] Fax: [phone]     Social Determinants of Health (SDOH) Social History: SDOH Screenings   Food Insecurity: No Food Insecurity (10/08/2022)  Housing: Low Risk  (10/08/2022)  Transportation Needs: No Transportation Needs (10/08/2022)  Utilities: Not At Risk (10/08/2022)  Alcohol Screen: Low Risk  (06/08/2022)  Depression (PHQ2-9): Medium Risk (06/17/2022)  Financial Resource Strain: Low Risk  (06/08/2022)  Physical Activity: Insufficiently Active (06/08/2022)  Social Connections: Moderately Integrated (06/08/2022)  Recent Concern: Social Connections - Moderately Isolated (06/08/2022)  Stress: No Stress Concern Present (06/08/2022)  Tobacco Use: Medium Risk (10/08/2022)   SDOH Interventions:     Readmission Risk Interventions     No data to display

## 2022-10-10 ENCOUNTER — Encounter (HOSPITAL_COMMUNITY)
Admission: EM | Disposition: A | Discharge status: Home / Self Care | Payer: Self-pay | Source: Home / Self Care | Attending: Family Medicine

## 2022-10-10 ENCOUNTER — Inpatient Hospital Stay (HOSPITAL_COMMUNITY): Payer: Medicare HMO | Admitting: Certified Registered"

## 2022-10-10 DIAGNOSIS — I2510 Atherosclerotic heart disease of native coronary artery without angina pectoris: Secondary | ICD-10-CM

## 2022-10-10 DIAGNOSIS — K529 Noninfective gastroenteritis and colitis, unspecified: Secondary | ICD-10-CM | POA: Diagnosis not present

## 2022-10-10 DIAGNOSIS — K648 Other hemorrhoids: Secondary | ICD-10-CM

## 2022-10-10 DIAGNOSIS — I252 Old myocardial infarction: Secondary | ICD-10-CM

## 2022-10-10 DIAGNOSIS — K559 Vascular disorder of intestine, unspecified: Principal | ICD-10-CM

## 2022-10-10 DIAGNOSIS — K625 Hemorrhage of anus and rectum: Secondary | ICD-10-CM | POA: Diagnosis not present

## 2022-10-10 DIAGNOSIS — Z87891 Personal history of nicotine dependence: Secondary | ICD-10-CM

## 2022-10-10 DIAGNOSIS — I10 Essential (primary) hypertension: Secondary | ICD-10-CM

## 2022-10-10 HISTORY — PX: COLONOSCOPY WITH PROPOFOL: SHX5780

## 2022-10-10 HISTORY — PX: BIOPSY: SHX5522

## 2022-10-10 LAB — GLUCOSE, CAPILLARY
Glucose-Capillary: 89 mg/dL (ref 70–99)
Glucose-Capillary: 77 mg/dL (ref 70–99)
Glucose-Capillary: 64 mg/dL — ABNORMAL LOW (ref 70–99)
Glucose-Capillary: 135 mg/dL — ABNORMAL HIGH (ref 70–99)
Glucose-Capillary: 101 mg/dL — ABNORMAL HIGH (ref 70–99)
Glucose-Capillary: 110 mg/dL — ABNORMAL HIGH (ref 70–99)
Glucose-Capillary: 158 mg/dL — ABNORMAL HIGH (ref 70–99)

## 2022-10-10 LAB — CBC
HCT: 40.4 % (ref 39.0–52.0)
Hemoglobin: 14 g/dL (ref 13.0–17.0)
MCH: 31.1 pg (ref 26.0–34.0)
MCHC: 34.7 g/dL (ref 30.0–36.0)
MCV: 89.8 fL (ref 80.0–100.0)
Platelets: 146 10*3/uL — ABNORMAL LOW (ref 150–400)
RBC: 4.5 MIL/uL (ref 4.22–5.81)
RDW: 13.2 % (ref 11.5–15.5)
WBC: 8.2 10*3/uL (ref 4.0–10.5)
nRBC: 0 % (ref 0.0–0.2)

## 2022-10-10 LAB — COMPREHENSIVE METABOLIC PANEL
ALT: 30 U/L (ref 0–44)
AST: 29 U/L (ref 15–41)
Albumin: 3.2 g/dL — ABNORMAL LOW (ref 3.5–5.0)
Alkaline Phosphatase: 35 U/L — ABNORMAL LOW (ref 38–126)
Anion gap: 11 (ref 5–15)
BUN: 17 mg/dL (ref 8–23)
CO2: 20 mmol/L — ABNORMAL LOW (ref 22–32)
Calcium: 8.9 mg/dL (ref 8.9–10.3)
Chloride: 111 mmol/L (ref 98–111)
Creatinine, Ser: 1.48 mg/dL — ABNORMAL HIGH (ref 0.61–1.24)
GFR, Estimated: 50 mL/min — ABNORMAL LOW (ref >60)
Glucose, Bld: 83 mg/dL (ref 70–99)
Potassium: 4 mmol/L (ref 3.5–5.1)
Sodium: 142 mmol/L (ref 135–145)
Total Bilirubin: 0.9 mg/dL (ref 0.3–1.2)
Total Protein: 6 g/dL — ABNORMAL LOW (ref 6.5–8.1)

## 2022-10-10 SURGERY — COLONOSCOPY WITH PROPOFOL
Anesthesia: Monitor Anesthesia Care

## 2022-10-10 MED ORDER — PHENYLEPHRINE 80 MCG/ML (10ML) SYRINGE FOR IV PUSH (FOR BLOOD PRESSURE SUPPORT)
PREFILLED_SYRINGE | INTRAVENOUS | Status: DC | PRN
  Administered 2022-10-10: 80 ug via INTRAVENOUS
  Administered 2022-10-10: 40 ug via INTRAVENOUS

## 2022-10-10 MED ORDER — PROPOFOL 10 MG/ML IV BOLUS
INTRAVENOUS | Status: DC | PRN
  Administered 2022-10-10 (×2): 50 mg via INTRAVENOUS

## 2022-10-10 MED ORDER — DEXTROSE 50 % IV SOLN
INTRAVENOUS | Status: DC | PRN
  Administered 2022-10-10: 25 mL via INTRAVENOUS

## 2022-10-10 MED ORDER — PROPOFOL 500 MG/50ML IV EMUL
INTRAVENOUS | Status: DC | PRN
  Administered 2022-10-10: 150 ug/kg/min via INTRAVENOUS

## 2022-10-10 MED ORDER — DEXTROSE 50 % IV SOLN
INTRAVENOUS | Status: AC
  Filled 2022-10-10: qty 50

## 2022-10-10 MED ORDER — LIDOCAINE 2% (20 MG/ML) 5 ML SYRINGE
INTRAMUSCULAR | Status: DC | PRN
  Administered 2022-10-10: 50 mg via INTRAVENOUS

## 2022-10-10 SURGICAL SUPPLY — 22 items

## 2022-10-10 NOTE — Anesthesia Preprocedure Evaluation (Signed)
Anesthesia Evaluation  Patient identified by MRN, date of birth, ID band Patient awake    Reviewed: Allergy & Precautions, H&P , NPO status , Patient's Chart, lab work & pertinent test results  Airway Mallampati: II   Neck ROM: full    Dental   Pulmonary sleep apnea , former smoker   breath sounds clear to auscultation       Cardiovascular hypertension, + CAD, + Past MI and + Cardiac Stents   Rhythm:regular Rate:Normal     Neuro/Psych  Headaches    GI/Hepatic ,GERD  ,,  Endo/Other  diabetes, Type 2    Renal/GU Renal InsufficiencyRenal diseaseCR 1.48     Musculoskeletal  (+) Arthritis ,    Abdominal   Peds  Hematology   Anesthesia Other Findings   Reproductive/Obstetrics                             Anesthesia Physical Anesthesia Plan  ASA: 3  Anesthesia Plan: MAC   Post-op Pain Management:    Induction: Intravenous  PONV Risk Score and Plan: 1 and Propofol infusion and Treatment may vary due to age or medical condition  Airway Management Planned: Nasal Cannula  Additional Equipment:   Intra-op Plan:   Post-operative Plan:   Informed Consent: I have reviewed the patients History and Physical, chart, labs and discussed the procedure including the risks, benefits and alternatives for the proposed anesthesia with the patient or authorized representative who has indicated his/her understanding and acceptance.     Dental advisory given  Plan Discussed with: CRNA, Anesthesiologist and Surgeon  Anesthesia Plan Comments:        Anesthesia Quick Evaluation

## 2022-10-10 NOTE — Progress Notes (Signed)
Hypoglycemic Event  CBG: 64   Treatment: D50 25 mL (12.5 gm)  Symptoms: None  Follow-up CBG: Time:1047 CBG Result:135  Possible Reasons for Event: Inadequate meal intake  Comments/MD notified:Dr. Alonza Bogus L Fallynn Gravett

## 2022-10-10 NOTE — Progress Notes (Addendum)
PROGRESS NOTE   Alan Morrison  RUE:[id number] DOB: [birth year] DOA: 10/08/2022 PCP: Eustaquio Boyden, MD  Brief Narrative:  72 y/o home dwell wm CAD STEMI 11/12/2019 + PCI--H/oh stress cardiomyopathy 2000-on aspirin DM TY 2+ vascular complications Admission for pyelonephritis 2003 History of DVT 2020-recurrent acute DVT 01/20/2022 status post mechanical thrombectomy R popliteal superficial femoral, femoral iliac and distal IVC-found to have acute PE with significant hypotension RV moderately enlarged at the time Known 1.4 cm indeterminate left adrenal nodule, 5.3 mm R LL lung  prior colonoscopy 07/2013 hyperplastic polyp sigmoid colon  Presented to the emergency room 6/27-15 episodes of bloody stool-white count 13.3 lactic acid mildly elevated CT = colitis C. difficile negative GI pathogen panel pending  6/22 scope Impression:               - The examined portion of the ileum was normal.                           - Localized inflammation was found in the                            descending colon and in the transverse colon.                            Biopsied. Recommendation:           - Return patient to hospital ward for ongoing care.                           - It is suspected that the patient's inflammation                            is due to ischemic colitis based upon the location                            of the inflammation.                           - Await pathology results.                           - Cont supportive care with IVFs and IV antibiotics.                           - Will advance diet to see if the patient.                           - Okay to restart patient's Xarelto tomorrow.                           - The findings and recommendations were discussed                            with the patient.  Hospital-Problem based course  Colitis-ischemic based on colonoscopy above Continue Cipro 500/FLagyll  twice daily x total 10 d abx--IVF 50 cc/h --pain control  Tylenol/morphine 2 mg every 2 as needed-now on reg diet  CAD At  this time holding Xarelto 10 daily aspirin 81 given blood in stool--can resume both in am Continue losartan 25 metoprolol 12.5 twice daily Crestor 40 etc.  Prior recurrent PE with DVT as above status post thrombectomy's Resume anticoagulation 6/23  BPH Continue finasteride 5 daily, Flomax 0.4 daily  DM TY 2 Metformin 1000 twice daily held  CBGs ranging 75-1 20--- Lantus 30 units (takes 100 at home)--titrate as CBG increases  Indeterminant adrenal nodules and nodules in right lung Needs outpatient characterization with repeat scan   DVT prophylaxis: SCD Code Status: Full Family Communication: None at bedside Disposition:  Status is: Observation The patient will require care spanning > 2 midnights and should be moved to inpatient because:   Needs further monitoring in the hospital    Subjective: Well Ate [place] sandwihc No pain abd No n/v understands what GI found in colon  Objective: Vitals:   10/10/22 1110 10/10/22 1115 10/10/22 1120 10/10/22 1515  BP: (!) 98/54 94/60 (!) 101/54 133/77  Pulse: 75 72 78 70  Resp: 14 13 17 17   Temp:    (!) 97.5 F (36.4 C)  TempSrc:    Oral  SpO2: 93% 92% 94% 97%  Weight:      Height:        Intake/Output Summary (Last 24 hours) at 10/10/2022 1620 Last data filed at 10/10/2022 1039 Gross per 24 hour  Intake 900 ml  Output 0 ml  Net 900 ml    Filed Weights   10/08/22 1133 10/10/22 0959  Weight: 81.6 kg 81.6 kg    Examination:  EOMI NCAT no focal deficit Moderate dentition no icterus no pallor good dentition Ctab no added sound S1 s2 no m/r/g Abd soft nt nd no rebound  Data Reviewed: personally reviewed   CBC    Component Value Date/Time   WBC 8.2 10/10/2022 0621   RBC 4.50 10/10/2022 0621   HGB 14.0 10/10/2022 0621   HGB 14.6 06/28/2018 0849   HGB 15.6 03/25/2017 1225   HCT 40.4 10/10/2022 0621   HCT 45.8 03/25/2017 1225   PLT 146 (L)  10/10/2022 0621   PLT 200 06/28/2018 0849   PLT 228 03/25/2017 1225   MCV 89.8 10/10/2022 0621   MCV 90 03/25/2017 1225   MCH 31.1 10/10/2022 0621   MCHC 34.7 10/10/2022 0621   RDW 13.2 10/10/2022 0621   RDW 13.6 03/25/2017 1225   LYMPHSABS 1.6 06/10/2022 0726   MONOABS 0.4 06/10/2022 0726   EOSABS 0.2 06/10/2022 0726   BASOSABS 0.0 06/10/2022 0726      Latest Ref Rng & Units 10/10/2022    6:21 AM 10/09/2022    5:51 AM 10/08/2022   11:36 AM  CMP  Glucose 70 - 99 mg/dL 83  79  818   BUN 8 - 23 mg/dL 17  23  33   Creatinine 0.61 - 1.24 mg/dL 2.99  3.71  6.96   Sodium 135 - 145 mmol/L 142  138  139   Potassium 3.5 - 5.1 mmol/L 4.0  3.8  4.6   Chloride 98 - 111 mmol/L 111  108  107   CO2 22 - 32 mmol/L 20  23  18    Calcium 8.9 - 10.3 mg/dL 8.9  8.7  9.8   Total Protein 6.5 - 8.1 g/dL 6.0   7.1   Total Bilirubin 0.3 - 1.2 mg/dL 0.9   1.2   Alkaline Phos 38 - 126 U/L 35   42   AST 15 - 41  U/L 29   51   ALT 0 - 44 U/L 30   50      Radiology Studies: No results found.   Scheduled Meds:  finasteride  5 mg Oral Daily   insulin aspart  0-15 Units Subcutaneous TID WC   insulin aspart  0-5 Units Subcutaneous QHS   insulin glargine-yfgn  30 Units Subcutaneous QHS   losartan  25 mg Oral Daily   metoprolol tartrate  12.5 mg Oral BID   rosuvastatin  40 mg Oral Q supper   tamsulosin  0.4 mg Oral Daily   Continuous Infusions:  ciprofloxacin Stopped (10/10/22 0711)   lactated ringers 100 mL/hr at 10/10/22 1143   metronidazole 500 mg (10/10/22 0935)     LOS: 1 day   Time spent: 62  Rhetta Mura, MD [facility] To contact the attending provider between 7A-7P or the covering provider during after hours 7P-7A, please log into the web site [email or web address] and access using universal [place] password for that web site. If you do not have the password, please call the hospital operator.  10/10/2022, 4:20 PM

## 2022-10-10 NOTE — Op Note (Signed)
[facility] Patient Name: Alan Morrison Procedure Date : 10/10/2022 MRN: [id number] Attending MD: Particia Lather , , [id number] Date of Birth: 1950-08-01 CSN: [id number] Age: 72 Admit Type: Inpatient Procedure:                Colonoscopy Indications:              Diarrhea, Abnormal CT of the GI tract Providers:                Madelyn Brunner" Jenne Campus RN, RN,                            Kandice Robinsons, Technician Referring MD:             Hospitalist team Medicines:                Monitored Anesthesia Care Complications:            No immediate complications. Estimated Blood Loss:     Estimated blood loss was minimal. Procedure:                Pre-Anesthesia Assessment:                           - Prior to the procedure, a History and Physical                            was performed, and patient medications and                            allergies were reviewed. The patient's tolerance of                            previous anesthesia was also reviewed. The risks                            and benefits of the procedure and the sedation                            options and risks were discussed with the patient.                            All questions were answered, and informed consent                            was obtained. Prior Anticoagulants: The patient has                            taken no anticoagulant or antiplatelet agents. ASA                            Grade Assessment: III - A patient with severe                            systemic disease. After reviewing the risks and  benefits, the patient was deemed in satisfactory                            condition to undergo the procedure.                           After obtaining informed consent, the colonoscope                            was passed under direct vision. Throughout the                            procedure, the patient's blood pressure, pulse, and                             oxygen saturations were monitored continuously. The                            CF-HQ190L ([id number] Olympus colonoscope was                            introduced through the anus and advanced to the the                            terminal ileum. The colonoscopy was performed                            without difficulty. The patient tolerated the                            procedure well. The quality of the bowel                            preparation was excellent. The terminal ileum,                            ileocecal valve, appendiceal orifice, and rectum                            were photographed. Scope In: 10:44:11 AM Scope Out: 10:55:57 AM Scope Withdrawal Time: 0 hours 9 minutes 42 seconds  Total Procedure Duration: 0 hours 11 minutes 46 seconds  Findings:      The terminal ileum appeared normal.      Localized inflammation characterized by congestion (edema), erythema,       granularity and shallow ulcerations was found in the descending colon       and in the transverse colon. Biopsies were taken with a cold forceps for       histology. Impression:               - The examined portion of the ileum was normal.                           - Localized inflammation was found in the  descending colon and in the transverse colon.                            Biopsied. Recommendation:           - Return patient to hospital ward for ongoing care.                           - It is suspected that the patient's inflammation                            is due to ischemic colitis based upon the location                            of the inflammation.                           - Await pathology results.                           - Cont supportive care with IVFs and IV antibiotics.                           - Will advance diet to see if the patient.                           - Okay to restart patient's Xarelto tomorrow.                           -  The findings and recommendations were discussed                            with the patient. Procedure Code(s):        --- Professional ---                           [phone], Colonoscopy, flexible; with biopsy, single                            or multiple Diagnosis Code(s):        --- Professional ---                           K64.8, Other hemorrhoids                           K52.9, Noninfective gastroenteritis and colitis,                            unspecified                           R19.7, Diarrhea, unspecified                           R93.3, Abnormal findings on diagnostic imaging of  other parts of digestive tract CPT copyright 2022 [employer]. All rights reserved. The codes documented in this report are preliminary and upon coder review may  be revised to meet current compliance requirements. Dr Particia Lather "Alan Ripper" Leonides Schanz,  10/10/2022 11:19:35 AM Number of Addenda: 0

## 2022-10-10 NOTE — Anesthesia Postprocedure Evaluation (Signed)
Anesthesia Post Note  Patient: Alan Morrison  Procedure(s) Performed: COLONOSCOPY WITH PROPOFOL BIOPSY     Patient location during evaluation: PACU Anesthesia Type: MAC Level of consciousness: awake and alert Pain management: pain level controlled Vital Signs Assessment: post-procedure vital signs reviewed and stable Respiratory status: spontaneous breathing, nonlabored ventilation, respiratory function stable and patient connected to nasal cannula oxygen Cardiovascular status: stable and blood pressure returned to baseline Postop Assessment: no apparent nausea or vomiting Anesthetic complications: no   No notable events documented.  Last Vitals:  Vitals:   10/10/22 1120 10/10/22 1515  BP: (!) 101/54 133/77  Pulse: 78 70  Resp: 17 17  Temp:  (!) 36.4 C  SpO2: 94% 97%    Last Pain:  Vitals:   10/10/22 1515  TempSrc: Oral  PainSc:                  [name]

## 2022-10-10 NOTE — Progress Notes (Signed)
Pt refuses cpap

## 2022-10-10 NOTE — Interval H&P Note (Signed)
History and Physical Interval Note:  10/10/2022 10:30 AM  Alan Morrison  has presented today for surgery, with the diagnosis of Colitis on imaging, diarrhea.  The various methods of treatment have been discussed with the patient and family. After consideration of risks, benefits and other options for treatment, the patient has consented to  Procedure(s): COLONOSCOPY WITH PROPOFOL (N/A) as a surgical intervention.  The patient's history has been reviewed, patient examined, no change in status, stable for surgery.  I have reviewed the patient's chart and labs.  Questions were answered to the patient's satisfaction.     Imogene Burn

## 2022-10-10 NOTE — Transfer of Care (Signed)
Immediate Anesthesia Transfer of Care Note  Patient: Alan Morrison  Procedure(s) Performed: COLONOSCOPY WITH PROPOFOL BIOPSY  Patient Location: PACU  Anesthesia Type:MAC  Level of Consciousness: drowsy  Airway & Oxygen Therapy: Patient Spontanous Breathing and Patient connected to face mask oxygen  Post-op Assessment: Report given to RN and Post -op Vital signs reviewed and stable  Post vital signs: Reviewed and stable  Last Vitals:  Vitals Value Taken Time  BP    Temp    Pulse    Resp    SpO2      Last Pain:  Vitals:   10/10/22 0959  TempSrc: Temporal  PainSc: 0-No pain      Patients Stated Pain Goal: 0 (10/10/22 0959)  Complications: No notable events documented.

## 2022-10-10 NOTE — Plan of Care (Signed)

## 2022-10-11 DIAGNOSIS — E860 Dehydration: Secondary | ICD-10-CM

## 2022-10-11 DIAGNOSIS — K625 Hemorrhage of anus and rectum: Secondary | ICD-10-CM | POA: Diagnosis not present

## 2022-10-11 DIAGNOSIS — K529 Noninfective gastroenteritis and colitis, unspecified: Secondary | ICD-10-CM | POA: Diagnosis not present

## 2022-10-11 DIAGNOSIS — N179 Acute kidney failure, unspecified: Secondary | ICD-10-CM

## 2022-10-11 LAB — COMPREHENSIVE METABOLIC PANEL
ALT: 27 U/L (ref 0–44)
AST: 28 U/L (ref 15–41)
Albumin: 2.6 g/dL — ABNORMAL LOW (ref 3.5–5.0)
Alkaline Phosphatase: 32 U/L — ABNORMAL LOW (ref 38–126)
Anion gap: 9 (ref 5–15)
BUN: 17 mg/dL (ref 8–23)
CO2: 21 mmol/L — ABNORMAL LOW (ref 22–32)
Calcium: 8.3 mg/dL — ABNORMAL LOW (ref 8.9–10.3)
Chloride: 109 mmol/L (ref 98–111)
Creatinine, Ser: 1.28 mg/dL — ABNORMAL HIGH (ref 0.61–1.24)
GFR, Estimated: 60 mL/min — ABNORMAL LOW (ref >60)
Glucose, Bld: 85 mg/dL (ref 70–99)
Potassium: 3.6 mmol/L (ref 3.5–5.1)
Sodium: 139 mmol/L (ref 135–145)
Total Bilirubin: 0.5 mg/dL (ref 0.3–1.2)
Total Protein: 5.3 g/dL — ABNORMAL LOW (ref 6.5–8.1)

## 2022-10-11 LAB — CBC
HCT: 35.4 % — ABNORMAL LOW (ref 39.0–52.0)
Hemoglobin: 12.8 g/dL — ABNORMAL LOW (ref 13.0–17.0)
MCH: 31.6 pg (ref 26.0–34.0)
MCHC: 36.2 g/dL — ABNORMAL HIGH (ref 30.0–36.0)
MCV: 87.4 fL (ref 80.0–100.0)
Platelets: 158 10*3/uL (ref 150–400)
RBC: 4.05 MIL/uL — ABNORMAL LOW (ref 4.22–5.81)
RDW: 12.9 % (ref 11.5–15.5)
WBC: 5.9 10*3/uL (ref 4.0–10.5)
nRBC: 0 % (ref 0.0–0.2)

## 2022-10-11 LAB — GLUCOSE, CAPILLARY
Glucose-Capillary: 103 mg/dL — ABNORMAL HIGH (ref 70–99)
Glucose-Capillary: 115 mg/dL — ABNORMAL HIGH (ref 70–99)

## 2022-10-11 MED ORDER — CIPROFLOXACIN HCL 500 MG PO TABS
500.0000 mg | ORAL_TABLET | Freq: Two times a day (BID) | ORAL | Status: DC
  Filled 2022-10-11: qty 1

## 2022-10-11 MED ORDER — METRONIDAZOLE 500 MG PO TABS: 500.0000 mg | ORAL_TABLET | Freq: Two times a day (BID) | ORAL | 0 refills | Status: AC

## 2022-10-11 MED ORDER — METRONIDAZOLE 500 MG PO TABS: 500.0000 mg | ORAL_TABLET | Freq: Two times a day (BID) | ORAL | Status: DC

## 2022-10-11 MED ORDER — BASAGLAR KWIKPEN 100 UNIT/ML ~~LOC~~ SOPN: 40.0000 [IU] | PEN_INJECTOR | Freq: Every day | SUBCUTANEOUS | Status: DC

## 2022-10-11 MED ORDER — ASPIRIN 81 MG PO CHEW
81.0000 mg | CHEWABLE_TABLET | Freq: Every day | ORAL | Status: DC
  Administered 2022-10-11: 81 mg via ORAL
  Filled 2022-10-11: qty 1

## 2022-10-11 MED ORDER — RIVAROXABAN 10 MG PO TABS
10.0000 mg | ORAL_TABLET | Freq: Every day | ORAL | Status: DC
  Administered 2022-10-11: 10 mg via ORAL
  Filled 2022-10-11: qty 1

## 2022-10-11 MED ORDER — CIPROFLOXACIN HCL 500 MG PO TABS: 500.0000 mg | ORAL_TABLET | Freq: Two times a day (BID) | ORAL | 0 refills | Status: AC

## 2022-10-11 NOTE — Discharge Summary (Signed)
Physician Discharge Summary  Alan Morrison ZOX:[id number] DOB: [birth year] DOA: 10/08/2022  PCP: Eustaquio Boyden, MD  Admit date: 10/08/2022 Discharge date: 10/11/2022  Time spent: 40 minutes  Recommendations for Outpatient Follow-up:  Needs Chem 7 cbc 1 week Note dosage changes insulins Complete flagyl Cipro course for isch colitis Adrenal nodules/lung nodules need characterization as OP per PCP  Discharge Diagnoses:  MAIN problem for hospitalization   GI bleed form isch colitis Dm ty ii Prior DVt 2020 Nodules adrenal and lungs  Please see below for itemized issues addressed in HOpsital- refer to other progress notes for clarity if needed  Discharge Condition: improved  Diet recommendation: dm hh  Filed Weights   10/08/22 1133 10/10/22 0959  Weight: 81.6 kg 81.6 kg    History of present illness:  72 y/o home dwell wm CAD STEMI 11/12/2019 + PCI--H/oh stress cardiomyopathy 2000-on aspirin DM TY 2+ vascular complications Admission for pyelonephritis 2003 History of DVT 2020-recurrent acute DVT 01/20/2022 status post mechanical thrombectomy R popliteal superficial femoral, femoral iliac and distal IVC-found to have acute PE with significant hypotension RV moderately enlarged at the time Known 1.4 cm indeterminate left adrenal nodule, 5.3 mm R LL lung  prior colonoscopy 07/2013 hyperplastic polyp sigmoid colon   Presented to the emergency room 6/27-15 episodes of bloody stool-white count 13.3 lactic acid mildly elevated CT = colitis C. difficile negative GI pathogen panel pending   6/22 scope Impression:               - The examined portion of the ileum was normal.                           - Localized inflammation was found in the                            descending colon and in the transverse colon.                            Biopsied. Recommendation:           - Return patient to hospital ward for ongoing care.                           - It is suspected that the  patient's inflammation                            is due to ischemic colitis based upon the location                            of the inflammation.                           - Await pathology results.                           - Cont supportive care with IVFs and IV antibiotics.                           - Will advance diet to see if the patient.                           -  Okay to restart patient's Xarelto tomorrow.                           - The findings and recommendations were discussed                            with the patient.  Hospital Course:  Colitis-ischemic based on colonoscopy above Continue Cipro 500/FLagyll  twice daily x total 10 d abx--IVF 50 cc/h --pain controlled not needing meds --cont on reg diet   CAD At this time holding Xarelto 10 daily aspirin 81 given blood in stool--resumed at d/c Continue losartan 25 metoprolol 12.5 twice daily Crestor 40 etc.   Prior recurrent PE with DVT as above status post thrombectomy's Resume anticoagulation 6/23   BPH Continue finasteride 5 daily, Flomax 0.4 daily   DM TY 2 Metformin 1000 twice daily held but resumed--Mounjaro resumed also at d/c CBGs 85-120- Lantus now 40 units (takes 80 at home)--titrate as CBG increases as OP   Indeterminant adrenal nodules and nodules in right lung Needs outpatient characterization with repeat scan   Discharge Exam: Vitals:   10/11/22 0432 10/11/22 0734  BP: 102/63 130/79  Pulse: 80 77  Resp: 18 17  Temp: 97.8 F (36.6 C) 98.1 F (36.7 C)  SpO2: 94% 98%    Subj on day of d/c   Awake coherent in nad no focal defciit No ict no pallor Cta b no added sound Abd soft nt nd no rebound no guard Neuro intact  Discharge Instructions   Discharge Instructions     Diet - low sodium heart healthy   Complete by: As directed    Discharge instructions   Complete by: As directed    See your regular doctor in 10-14 days and get labs  You had ischemic colitis--this is treated with  antibiotics as well as bowel rest--you should resume your blood thinners and aspirin without change  We noticed you needed less insulin--we have cut back some of your doses carefully.  Please see your pirmayr to titrate back  Take care and best of luck   Increase activity slowly   Complete by: As directed       Allergies as of 10/11/2022   No Known Allergies      Medication List     TAKE these medications    acetaminophen 500 MG tablet Commonly known as: TYLENOL Take 1,000 mg by mouth 2 (two) times daily as needed for headache (pain).   aspirin EC 81 MG tablet Take 81 mg by mouth daily with supper. Swallow whole.   Basaglar KwikPen 100 UNIT/ML Inject 40 Units into the skin at bedtime. What changed: how much to take   ciprofloxacin 500 MG tablet Commonly known as: CIPRO Take 1 tablet (500 mg total) by mouth 2 (two) times daily for 7 days.   finasteride 5 MG tablet Commonly known as: PROSCAR Take 1 tablet (5 mg total) by mouth daily.   Insulin Pen Needle 31G X 8 MM Misc Commonly known as: B-D ULTRAFINE III SHORT PEN 1 Device by Other route 4 (four) times daily. USE AS DIRECTED DAILY   Iron (Ferrous Sulfate) 325 (65 Fe) MG Tabs Take 325 mg by mouth every other day.   losartan 25 MG tablet Commonly known as: COZAAR TAKE 1 TABLET (25 MG TOTAL) BY MOUTH DAILY.   metFORMIN 1000 MG tablet Commonly known as: GLUCOPHAGE Take 1  tablet (1,000 mg total) by mouth 2 (two) times daily with a meal.   metoprolol tartrate 25 MG tablet Commonly known as: LOPRESSOR TAKE 0.5 TABLETS BY MOUTH 2 TIMES DAILY. What changed:  how much to take how to take this when to take this additional instructions   metroNIDAZOLE 500 MG tablet Commonly known as: FLAGYL Take 1 tablet (500 mg total) by mouth every 12 (twelve) hours for 7 days.   Mounjaro 10 MG/0.5ML Pen Generic drug: tirzepatide Inject 10 mg into the skin once a week.   multivitamin with minerals Tabs tablet Take 1 tablet  by mouth daily with breakfast.   nitroGLYCERIN 0.4 MG SL tablet Commonly known as: NITROSTAT PLACE 1 TABLET UNDER THE TONGUE EVERY 5 MINUTES AS NEEDED FOR CHEST PAIN What changed: See the new instructions.   OneTouch Ultra test strip Generic drug: glucose blood USE TO CHECK SUGARS DAILY  AS DIRECTED   probenecid 500 MG tablet Commonly known as: BENEMID Take 0.5 tablets (250 mg total) by mouth every other day.   rivaroxaban 10 MG Tabs tablet Commonly known as: XARELTO Take 1 tablet (10 mg total) by mouth daily.   rosuvastatin 40 MG tablet Commonly known as: CRESTOR Take 1 tablet (40 mg total) by mouth daily with supper.   tamsulosin 0.4 MG Caps capsule Commonly known as: FLOMAX Take 1 capsule (0.4 mg total) by mouth daily.   TUMS PO Take 1 tablet by mouth daily as needed (acid reflux/indigestion).   Vitamin D 50 MCG (2000 UT) tablet Take 2,000 Units by mouth daily with breakfast.       No Known Allergies    The results of significant diagnostics from this hospitalization (including imaging, microbiology, ancillary and laboratory) are listed below for reference.    Significant Diagnostic Studies: CT ABDOMEN PELVIS W CONTRAST  Result Date: 10/08/2022 CLINICAL DATA:  Nausea, vomiting, diarrhea. EXAM: CT ABDOMEN AND PELVIS WITH CONTRAST TECHNIQUE: Multidetector CT imaging of the abdomen and pelvis was performed using the standard protocol following bolus administration of intravenous contrast. RADIATION DOSE REDUCTION: This exam was performed according to the departmental dose-optimization program which includes automated exposure control, adjustment of the mA and/or kV according to patient size and/or use of iterative reconstruction technique. CONTRAST:  60mL OMNIPAQUE IOHEXOL 350 MG/ML SOLN COMPARISON:  July 29, 2022. FINDINGS: Lower chest: No acute abnormality. Hepatobiliary: No focal liver abnormality is seen. No gallstones, gallbladder wall thickening, or biliary  dilatation. Pancreas: Unremarkable. No pancreatic ductal dilatation or surrounding inflammatory changes. Spleen: Normal in size without focal abnormality. Adrenals/Urinary Tract: Stable left adrenal adenoma. Right adrenal gland is unremarkable. No hydronephrosis or renal obstruction is noted. Urinary bladder is unremarkable. Stomach/Bowel: Stomach and appendix appear normal. There is no evidence of bowel obstruction. Moderate wall thickening of descending colon is noted concerning for infectious or inflammatory colitis. Vascular/Lymphatic: Aortic atherosclerosis. No enlarged abdominal or pelvic lymph nodes. Reproductive: Mild prostatic enlargement. Other: No abdominal wall hernia or abnormality. No abdominopelvic ascites. Musculoskeletal: No acute or significant osseous findings. IMPRESSION: Moderate wall thickening of descending colon is noted concerning for infectious or inflammatory colitis. Mild prostatic enlargement. Aortic Atherosclerosis (ICD10-I70.0). Electronically Signed   By: Lupita Raider M.D.   On: 10/08/2022 13:57    Microbiology: Recent Results (from the past 240 hour(s))  Gastrointestinal Panel by PCR , Stool     Status: None   Collection Time: 10/08/22 12:39 PM   Specimen: Stool  Result Value Ref Range Status   Campylobacter species NOT DETECTED NOT  DETECTED Final   Plesimonas shigelloides NOT DETECTED NOT DETECTED Final   Salmonella species NOT DETECTED NOT DETECTED Final   Yersinia enterocolitica NOT DETECTED NOT DETECTED Final   Vibrio species NOT DETECTED NOT DETECTED Final   Vibrio cholerae NOT DETECTED NOT DETECTED Final   Enteroaggregative E coli (EAEC) NOT DETECTED NOT DETECTED Final   Enteropathogenic E coli (EPEC) NOT DETECTED NOT DETECTED Final   Enterotoxigenic E coli (ETEC) NOT DETECTED NOT DETECTED Final   Shiga like toxin producing E coli (STEC) NOT DETECTED NOT DETECTED Final   Shigella/Enteroinvasive E coli (EIEC) NOT DETECTED NOT DETECTED Final    Cryptosporidium NOT DETECTED NOT DETECTED Final   Cyclospora cayetanensis NOT DETECTED NOT DETECTED Final   Entamoeba histolytica NOT DETECTED NOT DETECTED Final   Giardia lamblia NOT DETECTED NOT DETECTED Final   Adenovirus F40/41 NOT DETECTED NOT DETECTED Final   Astrovirus NOT DETECTED NOT DETECTED Final   Norovirus GI/GII NOT DETECTED NOT DETECTED Final   Rotavirus A NOT DETECTED NOT DETECTED Final   Sapovirus (I, II, IV, and V) NOT DETECTED NOT DETECTED Final    Comment: Performed at [facility], [address] [place], [place] [address]  C Difficile Quick Screen w PCR reflex     Status: None   Collection Time: 10/08/22  2:03 PM   Specimen: Stool  Result Value Ref Range Status   C Diff antigen NEGATIVE NEGATIVE Final   C Diff toxin NEGATIVE NEGATIVE Final   C Diff interpretation No C. difficile detected.  Final    Comment: Performed at [facility], [address], [place], [place] [address]     Labs: Basic Metabolic Panel: Recent Labs  Lab 10/08/22 1136 10/09/22 0551 10/10/22 0621 10/11/22 0528  NA 139 138 142 139  K 4.6 3.8 4.0 3.6  CL 107 108 111 109  CO2 18* 23 20* 21*  GLUCOSE 123* 79 83 85  BUN 33* 23 17 17   CREATININE 1.55* 1.46* 1.48* 1.28*  CALCIUM 9.8 8.7* 8.9 8.3*   Liver Function Tests: Recent Labs  Lab 10/08/22 1136 10/10/22 0621 10/11/22 0528  AST 51* 29 28  ALT 50* 30 27  ALKPHOS 42 35* 32*  BILITOT 1.2 0.9 0.5  PROT 7.1 6.0* 5.3*  ALBUMIN 4.0 3.2* 2.6*   Recent Labs  Lab 10/08/22 1136  LIPASE 42   No results for input(s): "AMMONIA" in the last 168 hours. CBC: Recent Labs  Lab 10/08/22 1136 10/08/22 1644 10/09/22 0551 10/10/22 0621 10/11/22 0528  WBC 13.3* 11.9* 10.2 8.2 5.9  HGB 16.5 15.5 13.2 14.0 12.8*  HCT 48.0 44.2 37.2* 40.4 35.4*  MCV 90.4 89.7 87.5 89.8 87.4  PLT 183 196 150 146* 158   Cardiac Enzymes: No results for input(s): "CKTOTAL", "CKMB", "CKMBINDEX", "TROPONINI" in the last 168  hours. BNP: BNP (last 3 results) Recent Labs    01/02/22 2019  BNP 16.8    ProBNP (last 3 results) No results for input(s): "PROBNP" in the last 8760 hours.  CBG: Recent Labs  Lab 10/10/22 1138 10/10/22 1609 10/10/22 1950 10/11/22 0736 10/11/22 1103  GLUCAP 101* 110* 158* 103* 115*       Signed:  Rhetta Mura MD   [facility] 10/11/2022, 2:56 PM

## 2022-10-11 NOTE — Plan of Care (Signed)

## 2022-10-11 NOTE — Progress Notes (Signed)
     [facility] Progress Note  CC:     Nausea, vomiting, diarrhea   Subjective: Had one loose BM today. Abdominal pain has improved. Denies N&V. He has been able to eat.   Objective:  Vital signs in last 24 hours: Temp:  [97.5 F (36.4 C)-98.1 F (36.7 C)] 98.1 F (36.7 C) (06/23 0734) Pulse Rate:  [70-80] 77 (06/23 0734) Resp:  [16-18] 17 (06/23 0734) BP: (102-133)/(63-79) 130/79 (06/23 0734) SpO2:  [94 %-98 %] 98 % (06/23 0734) Last BM Date : 10/10/22 General: 72 year old male in no acute distress. Heart: Regular rate and rhythm, no murmurs. Pulm: Breath sounds clear throughout. Abdomen: Abdomen soft, nondistended. Non-tender  Extremities: No lower extremity edema. Neurologic:  Alert and  oriented x 4. Grossly normal neurologically. Psych:  Alert and cooperative. Normal mood and affect.  Intake/Output from previous day: 06/22 0701 - 06/23 0700 In: 900 [I.V.:900] Out: 0  Intake/Output this shift: Total I/O In: 80 [P.O.:80] Out: 0   Lab Results: Recent Labs    10/09/22 0551 10/10/22 0621 10/11/22 0528  WBC 10.2 8.2 5.9  HGB 13.2 14.0 12.8*  HCT 37.2* 40.4 35.4*  PLT 150 146* 158   BMET Recent Labs    10/09/22 0551 10/10/22 0621 10/11/22 0528  NA 138 142 139  K 3.8 4.0 3.6  CL 108 111 109  CO2 23 20* 21*  GLUCOSE 79 83 85  BUN 23 17 17   CREATININE 1.46* 1.48* 1.28*  CALCIUM 8.7* 8.9 8.3*   LFT Recent Labs    10/11/22 0528  PROT 5.3*  ALBUMIN 2.6*  AST 28  ALT 27  ALKPHOS 32*  BILITOT 0.5   PT/INR No results for input(s): "LABPROT", "INR" in the last 72 hours. Hepatitis Panel No results for input(s): "HEPBSAG", "HCVAB", "HEPAIGM", "HEPBIGM" in the last 72 hours.  No results found.  Assessment / Plan:  72 y.o. male with past medical history significant for type 2 diabetes on insulin, hypertension, CAD s/p MI with DES 10/2019, DVT/PE on Xarelto, hyperlipidemia, OSA, obesity, GERD, DVT admitted to the hospital 10/08/2022 with  nausea, vomiting, and diarrhea. CT abdomen pelvis with contrast shows moderate wall thickening of descending colon concerning for infectious versus inflammatory colitis.  GI pathogen panel negative. C. Diff antigen and C. Diff toxin negative. Colonoscopy showed descending colon and distal transverse colon inflammation suspected to be due to ischemia. Patient has shown improvement in his symptoms and can now tolerate PO.  -Okay to complete 10 days of antibiotics of presumed ischemic colitis -Cont heart healthy diet -IV fluids and pain management per the hospitalist -Restarted on Xarelto today -Will follow colon biopsy pathology -GI will sign off for now. Please call back if any new questions arise.  AKI, secondary to dehydration in the setting of recurrent diarrhea. Cr 1.48 -> 1.28    Principal Problem:   Colitis with rectal bleeding Active Problems:   Type 2 diabetes mellitus with chronic kidney disease, with long-term current use of insulin (HCC)   Coronary artery disease   Benign prostatic hyperplasia with urinary obstruction   Sleep apnea   Essential hypertension, benign   Hyperlipidemia associated with type 2 diabetes mellitus (HCC)     LOS: 2 days   Imogene Burn  10/11/2022, 4:05PM

## 2022-10-11 NOTE — Plan of Care (Signed)
  Problem: Education: Goal: Knowledge of General Education information will improve Description: Including pain rating scale, medication(s)/side effects and non-pharmacologic comfort measures 10/11/2022 1457 by Redgie Grayer, RN Outcome: Adequate for Discharge 10/11/2022 1134 by Redgie Grayer, RN Outcome: Progressing   Problem: Health Behavior/Discharge Planning: Goal: Ability to manage health-related needs will improve 10/11/2022 1457 by Redgie Grayer, RN Outcome: Adequate for Discharge 10/11/2022 1134 by Redgie Grayer, RN Outcome: Progressing   Problem: Clinical Measurements: Goal: Ability to maintain clinical measurements within normal limits will improve 10/11/2022 1457 by Redgie Grayer, RN Outcome: Adequate for Discharge 10/11/2022 1134 by Redgie Grayer, RN Outcome: Progressing Goal: Will remain free from infection 10/11/2022 1457 by Redgie Grayer, RN Outcome: Adequate for Discharge 10/11/2022 1134 by Redgie Grayer, RN Outcome: Progressing Goal: Diagnostic test results will improve 10/11/2022 1457 by Redgie Grayer, RN Outcome: Adequate for Discharge 10/11/2022 1134 by Redgie Grayer, RN Outcome: Progressing Goal: Respiratory complications will improve 10/11/2022 1457 by Redgie Grayer, RN Outcome: Adequate for Discharge 10/11/2022 1134 by Redgie Grayer, RN Outcome: Progressing Goal: Cardiovascular complication will be avoided 10/11/2022 1457 by Redgie Grayer, RN Outcome: Adequate for Discharge 10/11/2022 1134 by Redgie Grayer, RN Outcome: Progressing   Problem: Activity: Goal: Risk for activity intolerance will decrease 10/11/2022 1457 by Redgie Grayer, RN Outcome: Adequate for Discharge 10/11/2022 1134 by Redgie Grayer, RN Outcome: Progressing   Problem: Nutrition: Goal: Adequate nutrition will be maintained 10/11/2022 1457 by Redgie Grayer, RN Outcome: Adequate for Discharge 10/11/2022 1134 by Redgie Grayer, RN Outcome: Progressing   Problem: Coping: Goal: Level of anxiety  will decrease 10/11/2022 1457 by Redgie Grayer, RN Outcome: Adequate for Discharge 10/11/2022 1134 by Redgie Grayer, RN Outcome: Progressing   Problem: Elimination: Goal: Will not experience complications related to bowel motility 10/11/2022 1457 by Redgie Grayer, RN Outcome: Adequate for Discharge 10/11/2022 1134 by Redgie Grayer, RN Outcome: Progressing Goal: Will not experience complications related to urinary retention 10/11/2022 1457 by Redgie Grayer, RN Outcome: Adequate for Discharge 10/11/2022 1134 by Redgie Grayer, RN Outcome: Progressing   Problem: Pain Managment: Goal: General experience of comfort will improve 10/11/2022 1457 by Redgie Grayer, RN Outcome: Adequate for Discharge 10/11/2022 1134 by Redgie Grayer, RN Outcome: Progressing   Problem: Safety: Goal: Ability to remain free from injury will improve 10/11/2022 1457 by Redgie Grayer, RN Outcome: Adequate for Discharge 10/11/2022 1134 by Redgie Grayer, RN Outcome: Progressing   Problem: Skin Integrity: Goal: Risk for impaired skin integrity will decrease 10/11/2022 1457 by Redgie Grayer, RN Outcome: Adequate for Discharge 10/11/2022 1134 by Redgie Grayer, RN Outcome: Progressing

## 2022-10-11 NOTE — Progress Notes (Signed)
Patient has been discharged with his wife. PIV out and site looks clean, dry and intact. Education provided regarding his home medication and patient verbalize understanding. All his belongings and AVS paper has been sent with patient .

## 2022-10-12 ENCOUNTER — Encounter: Payer: Self-pay | Admitting: Family Medicine

## 2022-10-12 ENCOUNTER — Telehealth: Payer: Self-pay | Admitting: *Deleted

## 2022-10-12 NOTE — Transitions of Care (Post Inpatient/ED Visit) (Signed)
10/12/2022  Name: Alan Morrison MRN: [id number] DOB: 12-28-1950  Today's TOC FU Call Status: Today's TOC FU Call Status:: Successful TOC FU Call Competed TOC FU Call Complete Date: 10/12/22  Transition Care Management Follow-up Telephone Call Date of Discharge: 10/11/22 Discharge Facility: [facility]) Type of Discharge: Inpatient Admission Primary Inpatient Discharge Diagnosis:: Colitis with rectal bleeding How have you been since you were released from the hospital?: Better Any questions or concerns?: No  Items Reviewed: Did you receive and understand the discharge instructions provided?: Yes Medications obtained,verified, and reconciled?: Yes (Medications Reviewed) Any new allergies since your discharge?: No Dietary orders reviewed?: No Do you have support at home?: Yes People in Home: spouse Name of Support/Comfort Primary Source: Gavin Pound  Medications Reviewed Today: Medications Reviewed Today     Reviewed by Luella Cook, RN (Case Manager) on 10/12/22 at 1427  Med List Status: <None>   Medication Order Taking? Sig Documenting Provider Last Dose Status Informant  acetaminophen (TYLENOL) 500 MG tablet [id number] Yes Take 1,000 mg by mouth 2 (two) times daily as needed for headache (pain). [provider] Taking Active Self, Pharmacy Records  aspirin EC 81 MG tablet [id number] Yes Take 81 mg by mouth daily with supper. Swallow whole. [provider] Taking Active Self, Pharmacy Records           Med Note ([place], Vito Berger Jan 29, 2022  8:11 PM)    Calcium Carbonate Antacid (TUMS PO) [id number] Yes Take 1 tablet by mouth daily as needed (acid reflux/indigestion). [provider] Taking Active Self, Pharmacy Records  Cholecalciferol (VITAMIN D) 2000 units tablet [id number] Yes Take 2,000 Units by mouth daily with breakfast.  [provider] Taking Active Self, Pharmacy Records  ciprofloxacin (CIPRO) 500 MG tablet [id number] Yes  Take 1 tablet (500 mg total) by mouth 2 (two) times daily for 7 days. Rhetta Mura, MD Taking Active   finasteride (PROSCAR) 5 MG tablet [id number] Yes Take 1 tablet (5 mg total) by mouth daily. Eustaquio Boyden, MD Taking Active Self, Pharmacy Records  Insulin Glargine [facility]) 100 UNIT/ML [id number] Yes Inject 40 Units into the skin at bedtime. Rhetta Mura, MD Taking Active   Insulin Pen Needle (B-D ULTRAFINE III SHORT PEN) 31G X 8 MM MISC [id number] Yes 1 Device by Other route 4 (four) times daily. USE AS DIRECTED DAILY Romero Belling, MD Taking Active Self, Pharmacy Records  Iron, Ferrous Sulfate, 325 (65 Fe) MG TABS [id number] Yes Take 325 mg by mouth every other day. Eustaquio Boyden, MD Taking Active Self, Pharmacy Records  losartan (COZAAR) 25 MG tablet [id number] Yes TAKE 1 TABLET (25 MG TOTAL) BY MOUTH DAILY. [place], Peter M, MD Taking Active Self, Pharmacy Records  metFORMIN (GLUCOPHAGE) 1000 MG tablet [id number] Yes Take 1 tablet (1,000 mg total) by mouth 2 (two) times daily with a meal. Eustaquio Boyden, MD Taking Active Self, Pharmacy Records  metoprolol tartrate (LOPRESSOR) 25 MG tablet [id number] Yes TAKE 0.5 TABLETS BY MOUTH 2 TIMES DAILY.  Patient taking differently: Take 12.5 mg by mouth 2 (two) times daily.   [place], Peter M, MD Taking Active Self, Pharmacy Records  metroNIDAZOLE (FLAGYL) 500 MG tablet [id number] Yes Take 1 tablet (500 mg total) by mouth every 12 (twelve) hours for 7 days. Rhetta Mura, MD Taking Active   Multiple Vitamin (MULTIVITAMIN WITH MINERALS) TABS tablet [id number] Yes Take 1 tablet by mouth daily with breakfast.  [provider] Taking Active Self, Pharmacy Records  nitroGLYCERIN (NITROSTAT) 0.4  MG SL tablet [id number] Yes PLACE 1 TABLET UNDER THE TONGUE EVERY 5 MINUTES AS NEEDED FOR CHEST PAIN  Patient taking differently: Place 0.4 mg under the tongue every 5 (five) minutes as needed for chest pain.   [place], Peter M, MD  Taking Active Self, Pharmacy Records  [facility] ULTRA test strip [id number]  USE TO CHECK SUGARS DAILY  AS DIRECTED Eustaquio Boyden, MD  Active Self, Pharmacy Records  probenecid (BENEMID) 500 MG tablet [id number] Yes Take 0.5 tablets (250 mg total) by mouth every other day. Eustaquio Boyden, MD Taking Active Self, Pharmacy Records  rivaroxaban (XARELTO) 10 MG TABS tablet [id number] Yes Take 1 tablet (10 mg total) by mouth daily. Eustaquio Boyden, MD Taking Active Self, Pharmacy Records  rosuvastatin (CRESTOR) 40 MG tablet [id number] Yes Take 1 tablet (40 mg total) by mouth daily with supper. [place], Peter M, MD Taking Active Self, Pharmacy Records  tamsulosin [facility]) 0.4 MG CAPS capsule [id number] Yes Take 1 capsule (0.4 mg total) by mouth daily. Eustaquio Boyden, MD Taking Active Self, Pharmacy Records  tirzepatide [facility]) 10 MG/0.5ML Pen [id number] Yes Inject 10 mg into the skin once a week.  Taking Active Self, Pharmacy Records            Home Care and Equipment/Supplies: Were Home Health Services Ordered?: NA Any new equipment or medical supplies ordered?: NA  Functional Questionnaire: Do you need assistance with bathing/showering or dressing?: No Do you need assistance with meal preparation?: No Do you need assistance with eating?: No Do you have difficulty maintaining continence: No Do you need assistance with getting out of bed/getting out of a chair/moving?: No Do you have difficulty managing or taking your medications?: No  Follow up appointments reviewed: PCP Follow-up appointment confirmed?: No MD Provider Line Number:[phone] Given: Yes (Patient has called Dr office for an appointment) Specialist Hospital Follow-up appointment confirmed?: NA Do you need transportation to your follow-up appointment?: No Do you understand care options if your condition(s) worsen?: Yes-patient verbalized understanding  SDOH Interventions Today    Flowsheet Row Most Recent Value   SDOH Interventions   Food Insecurity Interventions Intervention Not Indicated  Housing Interventions Intervention Not Indicated  Transportation Interventions Intervention Not Indicated, Patient Resources (Friends/Family)      Interventions Today    Flowsheet Row Most Recent Value  General Interventions   General Interventions Discussed/Reviewed General Interventions Discussed, General Interventions Reviewed  Pharmacy Interventions   Pharmacy Dicussed/Reviewed Pharmacy Topics Discussed, Pharmacy Topics Reviewed      TOC Interventions Today    Flowsheet Row Most Recent Value  TOC Interventions   TOC Interventions Discussed/Reviewed TOC Interventions Discussed, TOC Interventions Reviewed  [Rn went over reasons patient needs to follow up with PCP for Labs, 4. Adrenal nodules/lung nodules need characterization as OP per PCP, 2. Note dosage changes insulins]       Gean Maidens BSN RN [facility] [phone]

## 2022-10-13 ENCOUNTER — Encounter (HOSPITAL_COMMUNITY): Payer: Self-pay | Admitting: Internal Medicine

## 2022-10-13 LAB — SURGICAL PATHOLOGY

## 2022-10-13 NOTE — Telephone Encounter (Signed)
Do recommend hosp f/u office visit - please schedule.

## 2022-10-19 ENCOUNTER — Encounter: Payer: Self-pay | Admitting: Family Medicine

## 2022-10-24 ENCOUNTER — Other Ambulatory Visit: Payer: Self-pay | Admitting: Cardiology

## 2022-10-26 ENCOUNTER — Encounter: Payer: Self-pay | Admitting: Family Medicine

## 2022-10-26 ENCOUNTER — Ambulatory Visit (INDEPENDENT_AMBULATORY_CARE_PROVIDER_SITE_OTHER)
Admission: RE | Admit: 2022-10-26 | Discharge: 2022-10-26 | Disposition: A | Discharge status: Home / Self Care | Payer: Medicare HMO | Source: Ambulatory Visit | Attending: Family Medicine | Admitting: Family Medicine

## 2022-10-26 ENCOUNTER — Ambulatory Visit (INDEPENDENT_AMBULATORY_CARE_PROVIDER_SITE_OTHER): Payer: Medicare HMO | Admitting: Family Medicine

## 2022-10-26 VITALS — BP 114/62 | HR 90 | Temp 97.6°F | Ht 67.5 in | Wt 179.4 lb

## 2022-10-26 DIAGNOSIS — N1831 Chronic kidney disease, stage 3a: Secondary | ICD-10-CM

## 2022-10-26 DIAGNOSIS — M542 Cervicalgia: Secondary | ICD-10-CM

## 2022-10-26 DIAGNOSIS — Z7984 Long term (current) use of oral hypoglycemic drugs: Secondary | ICD-10-CM | POA: Diagnosis not present

## 2022-10-26 DIAGNOSIS — E1122 Type 2 diabetes mellitus with diabetic chronic kidney disease: Secondary | ICD-10-CM

## 2022-10-26 DIAGNOSIS — I10 Essential (primary) hypertension: Secondary | ICD-10-CM | POA: Diagnosis not present

## 2022-10-26 DIAGNOSIS — R42 Dizziness and giddiness: Secondary | ICD-10-CM | POA: Diagnosis not present

## 2022-10-26 DIAGNOSIS — M47812 Spondylosis without myelopathy or radiculopathy, cervical region: Secondary | ICD-10-CM | POA: Diagnosis not present

## 2022-10-26 DIAGNOSIS — K559 Vascular disorder of intestine, unspecified: Secondary | ICD-10-CM | POA: Diagnosis not present

## 2022-10-26 DIAGNOSIS — Z794 Long term (current) use of insulin: Secondary | ICD-10-CM

## 2022-10-26 DIAGNOSIS — R911 Solitary pulmonary nodule: Secondary | ICD-10-CM

## 2022-10-26 LAB — COMPREHENSIVE METABOLIC PANEL
ALT: 35 U/L (ref 0–53)
AST: 35 U/L (ref 0–37)
Albumin: 4.2 g/dL (ref 3.5–5.2)
Alkaline Phosphatase: 33 U/L — ABNORMAL LOW (ref 39–117)
BUN: 29 mg/dL — ABNORMAL HIGH (ref 6–23)
CO2: 23 mEq/L (ref 19–32)
Calcium: 10.6 mg/dL — ABNORMAL HIGH (ref 8.4–10.5)
Chloride: 104 mEq/L (ref 96–112)
Creatinine, Ser: 1.39 mg/dL (ref 0.40–1.50)
GFR: 51.06 mL/min — ABNORMAL LOW (ref >60.00)
Glucose, Bld: 101 mg/dL — ABNORMAL HIGH (ref 70–99)
Potassium: 5.3 mEq/L — ABNORMAL HIGH (ref 3.5–5.1)
Sodium: 137 mEq/L (ref 135–145)
Total Bilirubin: 0.7 mg/dL (ref 0.2–1.2)
Total Protein: 7.2 g/dL (ref 6.0–8.3)

## 2022-10-26 LAB — CBC WITH DIFFERENTIAL/PLATELET
Basophils Absolute: 0.1 10*3/uL (ref 0.0–0.1)
Basophils Relative: 0.8 % (ref 0.0–3.0)
Eosinophils Absolute: 0.2 10*3/uL (ref 0.0–0.7)
Eosinophils Relative: 3.2 % (ref 0.0–5.0)
HCT: 44.5 % (ref 39.0–52.0)
Hemoglobin: 14.8 g/dL (ref 13.0–17.0)
Lymphocytes Relative: 20.2 % (ref 12.0–46.0)
Lymphs Abs: 1.3 10*3/uL (ref 0.7–4.0)
MCHC: 33.2 g/dL (ref 30.0–36.0)
MCV: 93.8 fl (ref 78.0–100.0)
Monocytes Absolute: 0.5 10*3/uL (ref 0.1–1.0)
Monocytes Relative: 7.1 % (ref 3.0–12.0)
Neutro Abs: 4.6 10*3/uL (ref 1.4–7.7)
Neutrophils Relative %: 68.7 % (ref 43.0–77.0)
Platelets: 258 10*3/uL (ref 150.0–400.0)
RBC: 4.74 Mil/uL (ref 4.22–5.81)
RDW: 14 % (ref 11.5–15.5)
WBC: 6.7 10*3/uL (ref 4.0–10.5)

## 2022-10-26 LAB — HEMOGLOBIN A1C: Hgb A1c MFr Bld: 5.4 % (ref 4.6–6.5)

## 2022-10-26 NOTE — Assessment & Plan Note (Addendum)
Chronic good control. With recent ischemic colitis diagnosis, want to avoid hypotension. Drop losartan to 12.5mg  daily (1/2 tablet), continue metoprolol 12.5mg  BID.

## 2022-10-26 NOTE — Assessment & Plan Note (Signed)
H/o this - update cervical films as per above.

## 2022-10-26 NOTE — Assessment & Plan Note (Signed)
Notes months of dizziness with significant cervical neck movement ie prolonged flexion.  Known h/o cervical spondylosis. Does not describe radiculopathy symptoms. Will update baseline cervical neck films.

## 2022-10-26 NOTE — Assessment & Plan Note (Addendum)
Stable period followed by endocrinology, on metformin 1000mg  bid and lantus 80u daily.  He also continues tirzepatide 10mg  weekly, with resultant almost 20 lb weight loss in the past 4 months.  Endorses good glycemic control ranging 80-120.  Will check A1c, if continued trending down, to check with endo on insulin dosing.

## 2022-10-26 NOTE — Patient Instructions (Addendum)
Labs today Recent ischemic colitis - see handout provided today.  Drop losartan to 12.5mg  daily (1/2 tablet) until you see cardiology at end of month.  Increase water intake to avoid dehydration especially in summer months.  Start monitoring blood pressures more regularly at home after above change.  Sugars are doing great - A1c checked today.  May cancel next month's appointment.  Schedule physical/wellness visit after 06/18/2023.  Neck xray today

## 2022-10-26 NOTE — Addendum Note (Signed)
Addended by: Eustaquio Boyden on: 10/26/2022 11:41 PM   Modules accepted: Orders

## 2022-10-26 NOTE — Assessment & Plan Note (Signed)
Followed by endo, recent rpt imaging and hormonal evaluation reassuring.

## 2022-10-26 NOTE — Progress Notes (Addendum)
Ph: [phone] Fax: [phone]   Patient ID: Alan Morrison, male    DOB: [birth year], 72 y.o.   MRN: [id number]  This visit was conducted in person.  BP 114/62   Pulse 90   Temp 97.6 F (36.4 C) (Temporal)   Ht 5' 7.5" (1.715 m)   Wt 179 lb 6 oz (81.4 kg)   SpO2 99%   BMI 27.68 kg/m   BP Readings from Last 3 Encounters:  10/26/22 114/62  10/11/22 130/79  06/17/22 122/78   CC: hospital f/u visit  Subjective:   HPI: Alan Morrison is a 71 y.o. male presenting on 10/26/2022 for Hospitalization Follow-up (Admitted on 10/08/22 at [facility], dx colitis.)   Recent hospitalization for episodes of bloody stool, nausea/vomiting, without abd pain or fever. CT scan showed colitis. C diff negative, GI pathogen panel negative. Colonoscopy on 10/10/2022 showed localized inflammation to descending and transverse colon, biopsies showed mucosal ischemic injury compatible with ischemic colitis. Treated with flagyl and ciprofloxacin 10d course.  Hospital records reviewed. Med rec performed.  Xarelto and aspirin were held - restarted on discharge.  He is on metoprolol 12.5mg  bid and losartan 25mg  daily - managed through cardiology - next cards appt is 11/11/2022 with Azalee Course PA.   Almost 20 lb weight loss in the past 6 months. DM - followed by Dr Tedd Sias in [place]. On Mounjaro 10mg  weekly and basaglar 80u daily. Home sugars run 100s. Nadir 80, peak 120.  Lab Results  Component Value Date   HGBA1C 5.4 10/26/2022  HGBA1C 6.0 (H) 07/2022   He hasn't been checking BP at home.   L adrenal adenoma - stable on rpt imaging, endo following, no further testing/imaging needed at this time.   By the way - ongoing for months, episodes of neck pain associated with dizziness when he has been bent over with neck flexed for prolonged period of time. No cervical radiculopathy symptoms or numbness/weakness.   Home health not set up.  Other follow up appointments scheduled: cardiology 11/11/2022, endocrinology  01/2023 ______________________________________________________________________ Hospital admission: 10/08/2022 Hospital discharge: 10/11/2022 TCM f/u phone call:  performed 10/12/2022  Recommendations for Outpatient Follow-up:  Needs Chem 7 cbc 1 week Note dosage changes insulins Complete flagyl Cipro course for isch colitis Adrenal nodules/lung nodules need characterization as OP per PCP   Discharge Diagnoses:  GI bleed form isch colitis Dm ty ii Prior DVt 2020 Nodules adrenal and lungs     Relevant past medical, surgical, family and social history reviewed and updated as indicated. Interim medical history since our last visit reviewed. Allergies and medications reviewed and updated. Outpatient Medications Prior to Visit  Medication Sig Dispense Refill   acetaminophen (TYLENOL) 500 MG tablet Take 1,000 mg by mouth 2 (two) times daily as needed for headache (pain).     aspirin EC 81 MG tablet Take 81 mg by mouth daily with supper. Swallow whole.     Calcium Carbonate Antacid (TUMS PO) Take 1 tablet by mouth daily as needed (acid reflux/indigestion).     Cholecalciferol (VITAMIN D) 2000 units tablet Take 2,000 Units by mouth daily with breakfast.      finasteride (PROSCAR) 5 MG tablet Take 1 tablet (5 mg total) by mouth daily. 90 tablet 4   Insulin Pen Needle (B-D ULTRAFINE III SHORT PEN) 31G X 8 MM MISC 1 Device by Other route 4 (four) times daily. USE AS DIRECTED DAILY 120 each 11   Iron, Ferrous Sulfate, 325 (65 Fe) MG TABS Take 325 mg by  mouth every other day.     metFORMIN (GLUCOPHAGE) 1000 MG tablet Take 1 tablet (1,000 mg total) by mouth 2 (two) times daily with a meal.     metoprolol tartrate (LOPRESSOR) 25 MG tablet TAKE 0.5 TABLETS BY MOUTH 2 TIMES DAILY. (Patient taking differently: Take 12.5 mg by mouth 2 (two) times daily.) 90 tablet 2   Multiple Vitamin (MULTIVITAMIN WITH MINERALS) TABS tablet Take 1 tablet by mouth daily with breakfast.      nitroGLYCERIN (NITROSTAT) 0.4 MG  SL tablet PLACE 1 TABLET UNDER THE TONGUE EVERY 5 MINUTES AS NEEDED FOR CHEST PAIN (Patient taking differently: Place 0.4 mg under the tongue every 5 (five) minutes as needed for chest pain.) 25 tablet 11   ONETOUCH ULTRA test strip USE TO CHECK SUGARS DAILY  AS DIRECTED 100 strip 3   probenecid (BENEMID) 500 MG tablet Take 0.5 tablets (250 mg total) by mouth every other day. 45 tablet 2   rivaroxaban (XARELTO) 10 MG TABS tablet Take 1 tablet (10 mg total) by mouth daily. 30 tablet 11   rosuvastatin (CRESTOR) 40 MG tablet Take 1 tablet (40 mg total) by mouth daily with supper. 90 tablet 2   tamsulosin (FLOMAX) 0.4 MG CAPS capsule Take 1 capsule (0.4 mg total) by mouth daily. 90 capsule 4   tirzepatide (MOUNJARO) 10 MG/0.5ML Pen Inject 10 mg into the skin once a week. 2 mL 4   Insulin Glargine (BASAGLAR KWIKPEN) 100 UNIT/ML Inject 40 Units into the skin at bedtime.     losartan (COZAAR) 25 MG tablet TAKE 1 TABLET (25 MG TOTAL) BY MOUTH DAILY. 90 tablet 1   Insulin Glargine (BASAGLAR KWIKPEN) 100 UNIT/ML Inject 80 Units into the skin at bedtime.     losartan (COZAAR) 25 MG tablet Take 0.5 tablets (12.5 mg total) by mouth daily.     No facility-administered medications prior to visit.     Per HPI unless specifically indicated in ROS section below Review of Systems  Objective:  BP 114/62   Pulse 90   Temp 97.6 F (36.4 C) (Temporal)   Ht 5' 7.5" (1.715 m)   Wt 179 lb 6 oz (81.4 kg)   SpO2 99%   BMI 27.68 kg/m   Wt Readings from Last 3 Encounters:  10/26/22 179 lb 6 oz (81.4 kg)  10/10/22 180 lb (81.6 kg)  06/17/22 197 lb (89.4 kg)      Physical Exam Vitals and nursing note reviewed.  Constitutional:      Appearance: Normal appearance. He is not ill-appearing.  HENT:     Head: Normocephalic and atraumatic.     Mouth/Throat:     Mouth: Mucous membranes are moist.     Pharynx: Oropharynx is clear. No oropharyngeal exudate or posterior oropharyngeal erythema.  Eyes:     General:         Right eye: No discharge.        Left eye: No discharge.     Extraocular Movements: Extraocular movements intact.     Conjunctiva/sclera: Conjunctivae normal.     Pupils: Pupils are equal, round, and reactive to light.  Neck:     Vascular: No carotid bruit.     Comments:  FROM at cervical neck with discomfort with full flexion/extension as well as lateral rotation R>L Tender to palpation midline cervical spine as well as L>R trapezius mm  Neg spurling bilaterally No vertebral bruit Cardiovascular:     Rate and Rhythm: Normal rate and regular rhythm.  Pulses: Normal pulses.     Heart sounds: Normal heart sounds. No murmur heard. Pulmonary:     Effort: Pulmonary effort is normal. No respiratory distress.     Breath sounds: Normal breath sounds. No wheezing, rhonchi or rales.  Abdominal:     General: Bowel sounds are normal. There is no distension.     Palpations: Abdomen is soft. There is no mass.     Tenderness: There is no abdominal tenderness. There is no guarding or rebound.     Hernia: No hernia is present.  Musculoskeletal:     Cervical back: Normal range of motion and neck supple. Tenderness (midline lower cervical spine) present.     Right lower leg: No edema.     Left lower leg: No edema.  Skin:    General: Skin is warm and dry.     Findings: No rash.  Neurological:     Mental Status: He is alert.  Psychiatric:        Mood and Affect: Mood normal.        Behavior: Behavior normal.       Results for orders placed or performed in visit on 10/26/22  CBC with Differential/Platelet  Result Value Ref Range   WBC 6.7 4.0 - 10.5 K/uL   RBC 4.74 4.22 - 5.81 Mil/uL   Hemoglobin 14.8 13.0 - 17.0 g/dL   HCT 09.8 11.9 - 14.7 %   MCV 93.8 78.0 - 100.0 fl   MCHC 33.2 30.0 - 36.0 g/dL   RDW 82.9 56.2 - 13.0 %   Platelets 258.0 150.0 - 400.0 K/uL   Neutrophils Relative % 68.7 43.0 - 77.0 %   Lymphocytes Relative 20.2 12.0 - 46.0 %   Monocytes Relative 7.1 3.0 - 12.0  %   Eosinophils Relative 3.2 0.0 - 5.0 %   Basophils Relative 0.8 0.0 - 3.0 %   Neutro Abs 4.6 1.4 - 7.7 K/uL   Lymphs Abs 1.3 0.7 - 4.0 K/uL   Monocytes Absolute 0.5 0.1 - 1.0 K/uL   Eosinophils Absolute 0.2 0.0 - 0.7 K/uL   Basophils Absolute 0.1 0.0 - 0.1 K/uL  Comprehensive metabolic panel  Result Value Ref Range   Sodium 137 135 - 145 mEq/L   Potassium 5.3 No hemolysis seen (H) 3.5 - 5.1 mEq/L   Chloride 104 96 - 112 mEq/L   CO2 23 19 - 32 mEq/L   Glucose, Bld 101 (H) 70 - 99 mg/dL   BUN 29 (H) 6 - 23 mg/dL   Creatinine, Ser 8.65 0.40 - 1.50 mg/dL   Total Bilirubin 0.7 0.2 - 1.2 mg/dL   Alkaline Phosphatase 33 (L) 39 - 117 U/L   AST 35 0 - 37 U/L   ALT 35 0 - 53 U/L   Total Protein 7.2 6.0 - 8.3 g/dL   Albumin 4.2 3.5 - 5.2 g/dL   GFR 78.46 (L) >96.29 mL/min   Calcium 10.6 (H) 8.4 - 10.5 mg/dL  Hemoglobin B2W  Result Value Ref Range   Hgb A1c MFr Bld 5.4 4.6 - 6.5 %    Assessment & Plan:   Problem List Items Addressed This Visit     Type 2 diabetes mellitus with chronic kidney disease, with long-term current use of insulin (HCC)    Stable period followed by endocrinology, on metformin 1000mg  bid and lantus 80u daily.  He also continues tirzepatide 10mg  weekly, with resultant almost 20 lb weight loss in the past 4 months.  Endorses good glycemic control  ranging 80-120.  Will check A1c, if continued trending down, to check with endo on insulin dosing.       Relevant Medications   Insulin Glargine (BASAGLAR KWIKPEN) 100 UNIT/ML   losartan (COZAAR) 25 MG tablet   Other Relevant Orders   Hemoglobin A1c (Completed)   Essential hypertension, benign    Chronic good control. With recent ischemic colitis diagnosis, want to avoid hypotension. Drop losartan to 12.5mg  daily (1/2 tablet), continue metoprolol 12.5mg  BID.       Relevant Medications   losartan (COZAAR) 25 MG tablet   Cervical spondylosis without myelopathy    H/o this - update cervical films as per above.        Right lower lobe pulmonary nodule    Followed by endo, recent rpt imaging and hormonal evaluation reassuring.       Ischemic colitis (HCC) - Primary    Reviewed diagnosis and management with patient, reviewed importance of good hydration status, avoiding hypotension to prevent ischemic colitis exacerbation.  Will drop losartan to 12.5mg  daily until he sees cardiology at end of the month.  He did complete flagyl/cipro course.       Relevant Orders   CBC with Differential/Platelet (Completed)   Comprehensive metabolic panel (Completed)   Dizziness    Notes months of dizziness with significant cervical neck movement ie prolonged flexion.  Known h/o cervical spondylosis. Does not describe radiculopathy symptoms. Will update baseline cervical neck films.       Relevant Orders   DG Cervical Spine Complete     No orders of the defined types were placed in this encounter.   Orders Placed This Encounter  Procedures   DG Cervical Spine Complete    Standing Status:   Future    Number of Occurrences:   1    Standing Expiration Date:   10/26/2023    Order Specific Question:   Reason for Exam (SYMPTOM  OR DIAGNOSIS REQUIRED)    Answer:   neck pain for months, dizziness with flexing neck    Order Specific Question:   Preferred imaging location?    Answer:   [facility]   CBC with Differential/Platelet   Comprehensive metabolic panel   Hemoglobin A1c    Patient Instructions  Labs today Recent ischemic colitis - see handout provided today.  Drop losartan to 12.5mg  daily (1/2 tablet) until you see cardiology at end of month.  Increase water intake to avoid dehydration especially in summer months.  Start monitoring blood pressures more regularly at home after above change.  Sugars are doing great - A1c checked today.  May cancel next month's appointment.  Schedule physical/wellness visit after 06/18/2023.  Neck xray today   Follow up plan: Return if symptoms worsen or  fail to improve.  Eustaquio Boyden, MD

## 2022-10-26 NOTE — Assessment & Plan Note (Addendum)
Reviewed diagnosis and management with patient, reviewed importance of good hydration status, avoiding hypotension to prevent ischemic colitis exacerbation.  Will drop losartan to 12.5mg  daily until he sees cardiology at end of the month.  He did complete flagyl/cipro course.

## 2022-10-27 ENCOUNTER — Other Ambulatory Visit: Payer: Self-pay

## 2022-10-27 MED ORDER — MOUNJARO 10 MG/0.5ML ~~LOC~~ SOAJ
10.0000 mg | SUBCUTANEOUS | 1 refills | Status: DC
  Filled 2022-10-27: qty 2, 28d supply, fill #0
  Filled 2022-11-27: qty 2, 28d supply, fill #1
  Filled 2022-12-28: qty 2, 28d supply, fill #2
  Filled 2023-01-19: qty 2, 28d supply, fill #3

## 2022-11-02 ENCOUNTER — Encounter: Payer: Self-pay | Admitting: Family Medicine

## 2022-11-02 DIAGNOSIS — I6523 Occlusion and stenosis of bilateral carotid arteries: Secondary | ICD-10-CM | POA: Insufficient documentation

## 2022-11-11 ENCOUNTER — Ambulatory Visit: Payer: Medicare HMO | Attending: Physician Assistant | Admitting: Physician Assistant

## 2022-11-11 VITALS — BP 122/64 | HR 84 | Ht 67.0 in | Wt 177.6 lb

## 2022-11-11 DIAGNOSIS — R42 Dizziness and giddiness: Secondary | ICD-10-CM | POA: Diagnosis not present

## 2022-11-11 DIAGNOSIS — E785 Hyperlipidemia, unspecified: Secondary | ICD-10-CM | POA: Diagnosis not present

## 2022-11-11 DIAGNOSIS — E278 Other specified disorders of adrenal gland: Secondary | ICD-10-CM

## 2022-11-11 DIAGNOSIS — E876 Hypokalemia: Secondary | ICD-10-CM | POA: Diagnosis not present

## 2022-11-11 DIAGNOSIS — E875 Hyperkalemia: Secondary | ICD-10-CM | POA: Diagnosis not present

## 2022-11-11 DIAGNOSIS — Z86718 Personal history of other venous thrombosis and embolism: Secondary | ICD-10-CM | POA: Diagnosis not present

## 2022-11-11 DIAGNOSIS — Z794 Long term (current) use of insulin: Secondary | ICD-10-CM | POA: Diagnosis not present

## 2022-11-11 DIAGNOSIS — I10 Essential (primary) hypertension: Secondary | ICD-10-CM | POA: Diagnosis not present

## 2022-11-11 DIAGNOSIS — I2510 Atherosclerotic heart disease of native coronary artery without angina pectoris: Secondary | ICD-10-CM

## 2022-11-11 DIAGNOSIS — E118 Type 2 diabetes mellitus with unspecified complications: Secondary | ICD-10-CM

## 2022-11-11 NOTE — Progress Notes (Signed)
Cardiology Office Note:  .   Date:  11/11/2022  ID:  Alan Morrison, DOB [birth year], MRN [id number] PCP: Eustaquio Boyden, MD  [facility] Providers Cardiologist:  Peter Swaziland, MD     History of Present Illness: .   Alan Morrison is a 72 y.o. male with PMH of CAD, HTN, HLD, DM II, stress cardiomyopathy, OSA on CPAP, former smoker and history of DVT on Xarelto.  Patient had a nonobstructive CAD by angiography in 2018.  He had inferior STEMI in July 2021.  Cardiac catheterization performed on 11/12/2019 showed total occlusion of the proximal to mid RCA treated with 3.0 x 22 mm Onyx DES postdilated to 3.5 mm, 30% mid PDA lesion, widely patent left main, 50% proximal to mid LAD, EF 55%, LVEDP 16 mmHg.  Postprocedure, patient was placed on triple therapy including aspirin, Plavix and Xarelto.  He was to discontinue aspirin after 1 month.  EF was 45 to 50% on echocardiogram.  During the hospitalization, patient had frequent hypoglycemic spells.  He was not very compliant with his insulin at home which explains a hypoglycemic episode when he was placed on full-strength insulin in the hospital.  Lovenox was stopped and he was discharged on long-acting insulin only with plan to evaluated by his endocrinologist for further medication adjustment.  He was seen by Dr. Darnelle Catalan in August 2021 for discontinued Xarelto.  He was readmitted in October 2023 with worsening lower extremity swelling and pain in the right lower extremity.  Lower extremity venous Doppler showed occlusive thrombus in the external iliac, common femoral, superficial femoral and deep femoral vein on the right.  Vascular surgery consulted and the patient was taken to the OR on 01/30/2022 and underwent percutaneous mechanical venous thrombectomy of the right popliteal, suprafemoral, pulm, femoral, iliac vein and distal IVCs.  Percutaneous mechanical venous thrombectomy of the right popliteal, superficial femoral, common femoral, iliac vein and  distal IVCs.  Angioplasty of the right iliac vein and distal IVC.  He was restarted on Xarelto.  He did have acute PE with moderate burden.  Echocardiogram showed EF 50 to 55%, grade 1 DD, RV moderately enlarged with normal EF.  He was last seen by Dr. Orinda Kenner in January 2024, right lower extremity swelling has completely resolved.  Repeat echocardiogram in January 2024 showed stable EF with resolution of the RV strain/dysfunction.  Since the last visit, patient was admitted in June 2024 with GI bleed from ischemic colitis.  He was placed on Cipro and the Flagyl.  He was incidentally found to have 1.4 cm indeterminate left adrenal nodule, 5.3 mm right lower lobe lung nodule.  Adrenal nodule was to be evaluated by PCP as outpatient.  C. difficile was negative.  He underwent GI scope which found localized inflammation in the descending colon and the transverse colon which were biopsied.  Alan Morrison was resumed on discharge.  Patient presents today for follow-up.  His losartan was reduced to 12.5 mg daily.  He is doing well, he still occasionally has some dizziness.  Recent cervical x-ray showed incidental finding of carotic calcification.  He does not have any carotid bruit on physical exam.  Given dizziness, I will obtain a carotid ultrasound.  Recent blood work also showed a hyperkalemia with potassium of 5.3, I will repeat blood work.  Otherwise he denies any chest pain, shortness of breath, lower extremity edema, orthopnea and PND.  He can follow-up with Dr. Orinda Kenner in 6 months.  ROS:   Patient denies any chest pain, shortness  of breath, lower extremity edema, orthopnea or PND.  He does have occasional dizziness.  Studies Reviewed: .        Cardiac Studies & Procedures   CARDIAC CATHETERIZATION  CARDIAC CATHETERIZATION 01/30/2022  Narrative Table formatting from the original result was not included. Images from the original result were not included.     Patient name: Alan Morrison  MRN:  [id number]        DOB: [birth year]          Sex: male  01/30/2022 Pre-operative Diagnosis: Extensive right leg DVT with iliofemoral and IVC involvement Post-operative diagnosis:  Same Surgeon:  Cephus Shelling, MD Procedure Performed: 1.  Ultrasound-guided access right popliteal vein 2.  Intravascular ultrasound (IVUS) of right popliteal, superficial femoral, common femoral, iliac vein and IVC 3.  Percutaneous mechanical venous thrombectomy of the right popliteal, superficial femoral, common femoral, iliac vein and distal IVC (Inari Clottriever) 4.  Percutaneous mechanical venous thrombectomy of the right popliteal, superficial femoral, common femoral, iliac vein and distal IVC (CAT 8 penumbra) 5.  Angioplasty of the right iliac vein and distal IVC (10 mm x 60 mm Mustang) 6.  98 minutes of monitored moderate conscious sedation time  Indications: Patient is a 72 year old male seen last night in ED by Dr. Karin Lieu with extensive right leg DVT with thrombus into the vena cava.  He presents today for percutaneous thrombectomy after risk benefits discussed.  Findings:  Right popliteal vein was accessed with the patient in the prone position.  IVUS initially showed thrombus in the right popliteal, superficial femoral, common femoral, iliac vein into the distal IVC with some hyperechoic areas suggesting more chronicity.  Initially used the [employer] in the vena cava above the thrombus and the device would not come down and appeared to be caught.  Ultimately we were able to retrieve what appeared to be very hard substance consistent with calcium in the distal IVC.  I did not think putting this device back in was a good idea given the difficulty we had with the initial attempt.  I then used the penumbra CAT 8 and got a lot of acute as well as some chronic appearing thrombus from the right leg into the IVC.  The right iliac as well as distal IVC was angioplastied with a 10 mm x 60 mm Mustang and  patient now has a patent flow channel at completion.  Procedure:  The patient was identified in the holding area and taken to room 8.  The patient was placed prone on the table.  Timeout was performed.  Initially evaluated the right popliteal vein with ultrasound, it was patent and image was saved.  This was accessed with micro access needle and placed a microwire and then a microsheath.  I then advanced a Glidewire advantage into the inferior vena cava and then exchanged for a 8 French sheath in the right popliteal vein.  Patient was given another 4000 units of IV heparin.  We checked an ACT to maintain greater than 250.  I then used IVUS and performed IVUS of the right popliteal, superficial femoral, common femoral, iliac vein, and IVC with findings as noted above.  I then elected to attempt Inari clottreiver and placed the 13 [place] Inari sheath in the right popliteal vein and then placed the device into the inferior vena cava once we had a wire into the left subclavian vein.  I then opened the device in the health patent vena cava and attempted to pull it through  the distal IVC and ultimately the device caught and would not come down as expected.  Using a number of maneuvers we were finally able to get the device down through the IVC but then I could not get it totally restrained to come out of the sheath and I removed the entire device and sheath over the wire leaving the wire in place and the device was removed in its entirety.  There was a large piece of hard substance like calcium that was retrieved from the vena cava that will be sent to the pathologist.  I then placed a new 13 [place] Inari sheath over a wire given we still had wire access.  I elected to not use innari clottriever anymore given the difficulty we had and then decided to use a penumbra CAT 8 and we performed multiple passes with percutaneous thrombectomy.  EBL was about 400 mL.  We did establish a flow channel and I ultimately ended up  performing balloon angioplasty of the right iliac vein and distal IVC with a 10 mm x 60 mm Mustang.  Wires and catheters were removed.  We pulled the sheath and tied down the pursestring with 4-0 monocryl with good hemostasis.  Plan: Do not stop heparin and this was continued through the procedure and patient can be transitioned to a DOAC.   Cephus Shelling, MD [facility] Office: [phone]   CARDIAC CATHETERIZATION  CARDIAC CATHETERIZATION 11/12/2019  Narrative  A stent was successfully placed.   Acute inferior ST elevation myocardial infarction with ongoing pain for greater than 9 hours.  Totally occluded proximal to mid RCA treated with 22 x 3.0 Onyx DES postdilated to 3.5 mm with TIMI grade III flow and resolution of symptoms.  The PDA contains segmental mid vessel 50 percent stenosis.  Widely patent, short left main  Widely patent LAD with proximal to mid eccentric 50 percent stenosis and luminal irregularities beyond.  Widely patent circumflex with luminal irregularities.  LV reveals inferior wall hypokinesis.  EF 55 percent.  LVEDP 16 mmHg.  RECOMMENDATIONS:   Aggressive risk factor modification.  If A1c significantly elevated, consider adding SGLT2 therapy.  Lipid panel and if LDL greater than 70, intensify management by adding ezetimibe or PCSK9.  Phase 2 cardiac rehab.  Findings Coronary Findings Diagnostic  Dominance: Right  Left Anterior Descending Prox LAD lesion is 50% stenosed. Mid LAD lesion is 50% stenosed. Mid LAD to Dist LAD lesion is 45% stenosed.  Left Circumflex Vessel is normal in caliber. The vessel exhibits minimal luminal irregularities.  First Obtuse Marginal Branch Vessel is small in size.  Second Obtuse Marginal Branch Vessel is large in size. Ost 2nd Mrg to 2nd Mrg lesion is 40% stenosed.  Right Coronary Artery Mid RCA lesion is 100% stenosed.  Right Posterior Descending Artery Ost  RPDA lesion is 25% stenosed.  Intervention  Mid RCA lesion Stent A stent was successfully placed. Post-Intervention Lesion Assessment The intervention was successful. There is a 0% residual stenosis post intervention.     ECHOCARDIOGRAM  ECHOCARDIOGRAM COMPLETE 05/04/2022  Narrative ECHOCARDIOGRAM REPORT    Patient Name:   Alan Morrison Date of Exam: 05/04/2022 Medical Rec #:  [id number]     Height:       67.0 in Accession #:    [id number]    Weight:       203.1 lb Date of Birth:  Aug 10, 1950     BSA:          2.036  m Patient Age:    71 years      BP:           115/71 mmHg Patient Gender: M             HR:           83 bpm. Exam Location:  [address]  Procedure: 2D Echo, 3D Echo, Cardiac Doppler and Color Doppler  Indications:    I42.9 Cardiomyopathy  History:        Patient has prior history of Echocardiogram examinations, most recent 01/30/2022. DVT, Signs/Symptoms:Chest Pain; Risk Factors:Diabetes, Sleep Apnea and HLD.  Sonographer:    Clearence Ped RCS Referring Phys: [id number] PETER M Swaziland  IMPRESSIONS   1. Left ventricular ejection fraction, by estimation, is 50 to 55%. The left ventricle has low normal function. The left ventricle has no regional wall motion abnormalities. There is mild concentric left ventricular hypertrophy. Left ventricular diastolic parameters are consistent with Grade I diastolic dysfunction (impaired relaxation). 2. Right ventricular systolic function is normal. The right ventricular size is normal. Tricuspid regurgitation signal is inadequate for assessing PA pressure. 3. The mitral valve is normal in structure. Trivial mitral valve regurgitation. No evidence of mitral stenosis. 4. The aortic valve is tricuspid. Aortic valve regurgitation is not visualized. Aortic valve sclerosis/calcification is present, without any evidence of aortic stenosis. 5. The inferior vena cava is normal in size with greater than 50% respiratory variability,  suggesting right atrial pressure of 3 mmHg.  FINDINGS Left Ventricle: Left ventricular ejection fraction, by estimation, is 50 to 55%. The left ventricle has low normal function. The left ventricle has no regional wall motion abnormalities. The left ventricular internal cavity size was normal in size. There is mild concentric left ventricular hypertrophy. Left ventricular diastolic parameters are consistent with Grade I diastolic dysfunction (impaired relaxation).  Right Ventricle: The right ventricular size is normal. No increase in right ventricular wall thickness. Right ventricular systolic function is normal. Tricuspid regurgitation signal is inadequate for assessing PA pressure.  Left Atrium: Left atrial size was normal in size.  Right Atrium: Right atrial size was normal in size.  Pericardium: There is no evidence of pericardial effusion.  Mitral Valve: The mitral valve is normal in structure. Trivial mitral valve regurgitation. No evidence of mitral valve stenosis.  Tricuspid Valve: The tricuspid valve is normal in structure. Tricuspid valve regurgitation is trivial. No evidence of tricuspid stenosis.  Aortic Valve: The aortic valve is tricuspid. Aortic valve regurgitation is not visualized. Aortic valve sclerosis/calcification is present, without any evidence of aortic stenosis.  Pulmonic Valve: The pulmonic valve was normal in structure. Pulmonic valve regurgitation is not visualized. No evidence of pulmonic stenosis.  Aorta: The aortic root is normal in size and structure.  Venous: The inferior vena cava is normal in size with greater than 50% respiratory variability, suggesting right atrial pressure of 3 mmHg.  IAS/Shunts: No atrial level shunt detected by color flow Doppler.   LEFT VENTRICLE PLAX 2D LVIDd:         4.90 cm   Diastology LVIDs:         3.60 cm   LV e' medial:    4.03 cm/s LV PW:         0.70 cm   LV E/e' medial:  13.4 LV IVS:        1.30 cm   LV e' lateral:    7.29 cm/s LVOT diam:     2.00 cm   LV E/e' lateral:  7.4 LVOT Area:     3.14 cm  3D Volume EF: 3D EF:        50 % LV EDV:       167 ml LV ESV:       83 ml LV SV:        84 ml  RIGHT VENTRICLE RV Basal diam:  3.00 cm RV S prime:     9.46 cm/s TAPSE (M-mode): 2.0 cm  LEFT ATRIUM             Index        RIGHT ATRIUM           Index LA diam:        3.50 cm 1.72 cm/m   RA Area:     14.50 cm LA Vol (A2C):   24.8 ml 12.18 ml/m  RA Volume:   36.30 ml  17.83 ml/m LA Vol (A4C):   29.8 ml 14.64 ml/m LA Biplane Vol: 28.4 ml 13.95 ml/m  AORTA Ao Root diam: 3.40 cm Ao Asc diam:  3.30 cm  MITRAL VALVE MV Area (PHT):             SHUNTS MV Decel Time:             Systemic Diam: 2.00 cm MR Peak grad: 24.8 mmHg MR Mean grad: 18.0 mmHg MR Vmax:      249.00 cm/s MR Vmean:     202.0 cm/s MV E velocity: 54.00 cm/s MV A velocity: 96.40 cm/s MV E/A ratio:  0.56  Arvilla Meres MD Electronically signed by Arvilla Meres MD Signature Date/Time: 05/04/2022/2:12:32 PM    Final     CT SCANS  CT CORONARY MORPH W/CTA COR W/SCORE 03/17/2017  Addendum 03/17/2017 10:52 PM ADDENDUM REPORT: 03/17/2017 22:49  CLINICAL DATA:  Chest pain  EXAM: Cardiac CTA  MEDICATIONS: Sub lingual nitro. 4mg  and lopressor 5mg  IV  TECHNIQUE: The patient was scanned on a Siemens 192 slice scanner. Gantry rotation speed was 240 msecs. Collimation was 0.6 mm. A 100 kV prospective scan was triggered in the ascending thoracic aorta centered around 35-75% of the R-R interval. Average HR during the scan was 65 bpm. The 3D data set was interpreted on a dedicated work station using MPR, MIP and VRT modes. A total of 80cc of contrast was used.  FINDINGS: Non-cardiac: See separate report from [facility].  Calcium Score:  790 Agatston units.  Coronary Arteries: Right dominant with no anomalies  LM:  No plaque or stenosis.  LAD system: Extensive mixed plaque in the proximal to mid LAD  with mild stenosis except for an area in the mid LAD that is concerning for possible moderate stenosis. Calcified plaque in distal RCA, mild stenosis.  Circumflex system: Moderate OM1 with mixed plaque proximally, possible moderate stenosis. The AV LCx after OM1 is small with minimal disease.  RCA system: Calcified plaque in the mid RCA, mild to possibly moderate stenosis. Calcified plaque in the PDA with mild stenosis.  IMPRESSION: 1. Coronary artery calcium score 790 Agatston units, placing the patient in the 89th percentile for age and gender. This suggests high risk for future cardiac events.  2. Extensive plaque in the proximal to mid LAD, possible area of moderate stenosis in the mid LAD.  3.  Moderate OM1 with moderate stenosis proximally.  4.  Mild to possibly moderate mid RCA stenosis.  This study will be sent for CT FFR.  [employer]   Electronically Signed By: Montel Clock.D.  On: 03/17/2017 22:49  Narrative EXAM: OVER-READ INTERPRETATION  CT CHEST  The following report is an over-read performed by radiologist Dr. Noe Gens [facility] on 03/17/2017. This over-read does not include interpretation of cardiac or coronary anatomy or pathology. The coronary CTA interpretation by the cardiologist is attached.  COMPARISON:  None.  FINDINGS: Vascular: Heart is normal size.  Visualized aorta normal caliber.  Mediastinum/Nodes: No adenopathy in the lower mediastinum or hila.  Lungs/Pleura: Visualized lungs clear.  No effusions.  Upper Abdomen: Fatty infiltration of the liver. No acute findings in the upper abdomen.  Musculoskeletal: Chest wall soft tissues are unremarkable. No acute bony abnormality.  IMPRESSION: Fatty infiltration of the liver.  No acute extra cardiac abnormality.  Electronically Signed: By: Charlett Nose M.D. On: 03/17/2017 15:50          Risk Assessment/Calculations:             Physical Exam:   VS:   BP 122/64   Pulse 84   Ht 5\' 7"  (1.702 m)   Wt 177 lb 9.6 oz (80.6 kg)   SpO2 94%   BMI 27.82 kg/m    Wt Readings from Last 3 Encounters:  11/11/22 177 lb 9.6 oz (80.6 kg)  10/26/22 179 lb 6 oz (81.4 kg)  10/10/22 180 lb (81.6 kg)    GEN: Well nourished, well developed in no acute distress NECK: No JVD; No carotid bruits CARDIAC: RRR, no murmurs, rubs, gallops RESPIRATORY:  Clear to auscultation without rales, wheezing or rhonchi  ABDOMEN: Soft, non-tender, non-distended EXTREMITIES:  No edema; No deformity   ASSESSMENT AND PLAN: .    Dizziness: Recent x-ray showed carotid bulb calcification.  Patient complains of occasional dizziness.  No bruit on physical exam.  Will check carotid ultrasound.  Hyperkalemia: Recent blood work showed potassium 5.3, will obtain basic metabolic panel today.  CAD: On aspirin on top of Xarelto.  No longer on Plavix.  Denies any exertional chest pain.  Hypertension: Blood pressure well-controlled.  Losartan was recently reduced to 12.5 mg daily.  Hyperlipidemia: On rosuvastatin  DM2: On insulin  History of recurrent DVT: Last episode of extensive DVT required venous thrombectomy by vascular surgery in October 2023.  His leg swelling has resolved.  Continue on Xarelto lifelong.  Adrenal nodule: Found during recent hospitalization.  He was found to have 1.4 cm left adrenal nodule.  This was supposed to be followed by primary care provider.       Dispo: Follow-up with Dr. Swaziland in 6 months.  Signed, Azalee Course, PA

## 2022-11-11 NOTE — Patient Instructions (Signed)
Medication Instructions:  Your physician recommends that you continue on your current medications as directed. Please refer to the Current Medication list given to you today.  *If you need a refill on your cardiac medications before your next appointment, please call your pharmacy*   Lab Work: TODAY:  BMET  If you have labs (blood work) drawn today and your tests are completely normal, you will receive your results only by: MyChart Message (if you have MyChart) OR A paper copy in the mail If you have any lab test that is abnormal or we need to change your treatment, we will call you to review the results.   Testing/Procedures: Your physician has requested that you have a carotid duplex. This test is an ultrasound of the carotid arteries in your neck. It looks at blood flow through these arteries that supply the brain with blood. Allow one hour for this exam. There are no restrictions or special instructions.    Follow-Up: At [facility], you and your health needs are our priority.  As part of our continuing mission to provide you with exceptional heart care, we have created designated Provider Care Teams.  These Care Teams include your primary Cardiologist (physician) and Advanced Practice Providers (APPs -  Physician Assistants and Nurse Practitioners) who all work together to provide you with the care you need, when you need it.  We recommend signing up for the patient portal called "MyChart".  Sign up information is provided on this After Visit Summary.  MyChart is used to connect with patients for Virtual Visits (Telemedicine).  Patients are able to view lab/test results, encounter notes, upcoming appointments, etc.  Non-urgent messages can be sent to your provider as well.   To learn more about what you can do with MyChart, go to [email or web address].    Your next appointment:   6 month(s)  Provider:   Peter Swaziland, MD     Other Instructions

## 2022-11-12 ENCOUNTER — Telehealth: Payer: Self-pay

## 2022-11-12 LAB — BASIC METABOLIC PANEL
BUN/Creatinine Ratio: 28 — ABNORMAL HIGH (ref 10–24)
BUN: 38 mg/dL — ABNORMAL HIGH (ref 8–27)
CO2: 21 mmol/L (ref 20–29)
Calcium: 10.5 mg/dL — ABNORMAL HIGH (ref 8.6–10.2)
Chloride: 104 mmol/L (ref 96–106)
Creatinine, Ser: 1.36 mg/dL — ABNORMAL HIGH (ref 0.76–1.27)
Glucose: 83 mg/dL (ref 70–99)
Potassium: 5.7 mmol/L — ABNORMAL HIGH (ref 3.5–5.2)
Sodium: 138 mmol/L (ref 134–144)

## 2022-11-12 NOTE — Telephone Encounter (Signed)
Left voicemail to return call to office.

## 2022-11-12 NOTE — Telephone Encounter (Signed)
-----   Message from [facility] sent at 11/12/2022  4:43 PM EDT ----- Potassium remain high. Let's give the patient 2 packets of Lokelma taken daily for 2 days. There should samples of lokelma in [facility]. Patient can pick it up tomorrow morning. Repeat BMET in 1-2 week.

## 2022-11-13 ENCOUNTER — Ambulatory Visit (HOSPITAL_COMMUNITY)
Admission: RE | Admit: 2022-11-13 | Discharge: 2022-11-13 | Disposition: A | Discharge status: Home / Self Care | Payer: Medicare HMO | Source: Ambulatory Visit | Attending: Cardiovascular Disease | Admitting: Cardiovascular Disease

## 2022-11-13 DIAGNOSIS — R42 Dizziness and giddiness: Secondary | ICD-10-CM | POA: Insufficient documentation

## 2022-11-27 ENCOUNTER — Other Ambulatory Visit: Payer: Self-pay

## 2022-12-02 ENCOUNTER — Encounter: Payer: Self-pay | Admitting: Family Medicine

## 2022-12-02 DIAGNOSIS — M47812 Spondylosis without myelopathy or radiculopathy, cervical region: Secondary | ICD-10-CM

## 2022-12-02 DIAGNOSIS — R42 Dizziness and giddiness: Secondary | ICD-10-CM

## 2022-12-03 ENCOUNTER — Telehealth (INDEPENDENT_AMBULATORY_CARE_PROVIDER_SITE_OTHER): Payer: Medicare HMO | Admitting: Internal Medicine

## 2022-12-03 ENCOUNTER — Encounter: Payer: Self-pay | Admitting: Internal Medicine

## 2022-12-03 VITALS — Ht 67.0 in | Wt 170.0 lb

## 2022-12-03 DIAGNOSIS — U071 COVID-19: Secondary | ICD-10-CM | POA: Insufficient documentation

## 2022-12-03 HISTORY — DX: COVID-19: U07.1

## 2022-12-03 MED ORDER — BENZONATATE 200 MG PO CAPS: 200.0000 mg | ORAL_CAPSULE | Freq: Three times a day (TID) | ORAL | 0 refills | Status: DC | PRN

## 2022-12-03 NOTE — Assessment & Plan Note (Signed)
Mild infection Day 4 On xarelto for recurrent DVT--could probably stop if he was really sick--to get paxlovid--but he looks pretty normal Discussed rest, tylenol, OTC cough suppressant Rx--benzonatate for cough If worsens, to ER to consider remdesivir Isolate for another few days--then out with mask for a week

## 2022-12-03 NOTE — Progress Notes (Signed)
Subjective:    Patient ID: Alan Morrison, male    DOB: [birth year], 72 y.o.   MRN: [id number]  HPI Video virtual visit for COVID infection Identification done Discussed limitations and billing and he gave consent Participants--patient in his home and I am in my office  Started feeling bad 3 days ago Worst day was 2 days ago Felt like a cold---didn't think of checking till after wife tested positive Coughing and stuffy head Slight fever the first day.  No chills, sweats or muscle aches No headache Some sore throat--slight. No ear pain No SOB  Taking antihistamine for cough  Current Outpatient Medications on File Prior to Visit  Medication Sig Dispense Refill   acetaminophen (TYLENOL) 500 MG tablet Take 1,000 mg by mouth 2 (two) times daily as needed for headache (pain).     aspirin EC 81 MG tablet Take 81 mg by mouth daily with supper. Swallow whole.     Calcium Carbonate Antacid (TUMS PO) Take 1 tablet by mouth daily as needed (acid reflux/indigestion).     Cholecalciferol (VITAMIN D) 2000 units tablet Take 2,000 Units by mouth daily with breakfast.      finasteride (PROSCAR) 5 MG tablet Take 1 tablet (5 mg total) by mouth daily. 90 tablet 4   Insulin Glargine (BASAGLAR KWIKPEN) 100 UNIT/ML Inject 80 Units into the skin at bedtime.     Insulin Pen Needle (B-D ULTRAFINE III SHORT PEN) 31G X 8 MM MISC 1 Device by Other route 4 (four) times daily. USE AS DIRECTED DAILY 120 each 11   Iron, Ferrous Sulfate, 325 (65 Fe) MG TABS Take 325 mg by mouth every other day.     losartan (COZAAR) 25 MG tablet Take 0.5 tablets (12.5 mg total) by mouth daily.     metFORMIN (GLUCOPHAGE) 1000 MG tablet Take 1 tablet (1,000 mg total) by mouth 2 (two) times daily with a meal.     metoprolol tartrate (LOPRESSOR) 25 MG tablet TAKE 0.5 TABLETS BY MOUTH 2 TIMES DAILY. (Patient taking differently: Take 12.5 mg by mouth 2 (two) times daily.) 90 tablet 2   Multiple Vitamin (MULTIVITAMIN WITH MINERALS) TABS  tablet Take 1 tablet by mouth daily with breakfast.      nitroGLYCERIN (NITROSTAT) 0.4 MG SL tablet PLACE 1 TABLET UNDER THE TONGUE EVERY 5 MINUTES AS NEEDED FOR CHEST PAIN (Patient taking differently: Place 0.4 mg under the tongue every 5 (five) minutes as needed for chest pain.) 25 tablet 11   ONETOUCH ULTRA test strip USE TO CHECK SUGARS DAILY  AS DIRECTED 100 strip 3   probenecid (BENEMID) 500 MG tablet Take 0.5 tablets (250 mg total) by mouth every other day. 45 tablet 2   rivaroxaban (XARELTO) 10 MG TABS tablet Take 1 tablet (10 mg total) by mouth daily. 30 tablet 11   rosuvastatin (CRESTOR) 40 MG tablet Take 1 tablet (40 mg total) by mouth daily with supper. 90 tablet 2   tamsulosin (FLOMAX) 0.4 MG CAPS capsule Take 1 capsule (0.4 mg total) by mouth daily. 90 capsule 4   tirzepatide (MOUNJARO) 10 MG/0.5ML Pen Inject 10 mg into the skin once a week. 6 mL 1   No current facility-administered medications on file prior to visit.    No Known Allergies  Past Medical History:  Diagnosis Date   Acute ST elevation myocardial infarction (STEMI) of inferior wall (HCC) 11/12/2019   Mid RCA 100%--> 22 x 3.0 Onyx to 3.5 mm   Anemia, iron deficiency, inadequate dietary intake  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   by [place]   Bilateral pulmonary embolism (HCC) 02/05/2022   Cervical spondylosis without myelopathy    Choroidal nevus 01/22/2011   left, yearly eye exam, no diabetic retinopathy   Diabetes mellitus type II    DVT of deep femoral vein, left (HCC) 04/29/2018   LLE DVT from proximal femoral vein to popliteal vein into proximal calf (04/29/2018) started on xarelto Heme (Magrinat) recommended continuing lifelong lower dose anticoagulant indefinitely (11/2018)   Dyspepsia    Ex-smoker    Fatty liver 02/2016   by [place]   GERD (gastroesophageal reflux disease)    Headache(784.0)    Heart murmur    Hx of   HLD (hyperlipidemia)    Hyperuricemia    Obesity    Sleep apnea    Stress-induced  cardiomyopathy 09/27/1998   WNL, EF 64%   Urosepsis 01/01-01/05/10   Hospitalization    Past Surgical History:  Procedure Laterality Date   BIOPSY  10/10/2022   Procedure: BIOPSY;  Surgeon: Imogene Burn, MD;  Location: [facility] ENDOSCOPY;  Service: Gastroenterology;;   CARDIAC CATHETERIZATION     COLONOSCOPY  07/2013   1 benign polyp rpt 10 yrs Arlyce Dice)   COLONOSCOPY WITH PROPOFOL N/A 10/10/2022   Procedure: COLONOSCOPY WITH PROPOFOL;  Surgeon: Imogene Burn, MD;  Location: [facility] ENDOSCOPY;  Service: Gastroenterology;  Laterality: N/A;   CORONARY THROMBECTOMY N/A 11/12/2019   Procedure: Coronary Thrombectomy;  Surgeon: Lyn Records, MD;  Location: [facility] INVASIVE CV LAB;  Service: Cardiovascular;  Laterality: N/A;   CORONARY ULTRASOUND/IVUS N/A 01/30/2022   Procedure: Intravascular Ultrasound/IVUS;  Surgeon: Cephus Shelling, MD;  Location: [facility] INVASIVE CV LAB;  Service: Cardiovascular;  Laterality: N/A;  Venous   CORONARY/GRAFT ACUTE MI REVASCULARIZATION N/A 11/12/2019   Procedure: Coronary/Graft Acute MI Revascularization;  Surgeon: Lyn Records, MD;  Location: [facility] INVASIVE CV LAB;  Service: Cardiovascular;  Laterality: N/A;   KNEE ARTHROSCOPY Left 1988   KNEE SURGERY Right 05/21/03   Right, Dr. August Saucer, med meniscus tear via MRI   LEFT HEART CATH AND CORONARY ANGIOGRAPHY N/A 04/01/2017   Procedure: LEFT HEART CATH AND CORONARY ANGIOGRAPHY;  Surgeon: Swaziland, Peter M, MD;  Location: [facility] INVASIVE CV LAB;  Service: Cardiovascular;  Laterality: N/A;   LEFT HEART CATH AND CORONARY ANGIOGRAPHY N/A 11/12/2019   Procedure: LEFT HEART CATH AND CORONARY ANGIOGRAPHY;  Surgeon: Lyn Records, MD;  Location: [facility] INVASIVE CV LAB;  Service: Cardiovascular;  Laterality: N/A;   MYELOGRAM  08/05   bulging disc   PERIPHERAL VASCULAR BALLOON ANGIOPLASTY  01/30/2022   Procedure: PERIPHERAL VASCULAR BALLOON ANGIOPLASTY;  Surgeon: Cephus Shelling, MD;  Location: [facility] INVASIVE CV LAB;  Service: Cardiovascular;;    PERIPHERAL VASCULAR THROMBECTOMY Right 01/30/2022   Procedure: PERIPHERAL VASCULAR THROMBECTOMY;  Surgeon: Cephus Shelling, MD;  Location: [facility] INVASIVE CV LAB;  Service: Cardiovascular;  Laterality: Right;    Family History  Problem Relation Age of Onset   Heart disease Mother        CHF   Diabetes Mother    Heart disease Father        CHF   Hypertension Father    Pulmonary fibrosis Sister    Sjogren's syndrome Sister    Migraines Brother        severe headaches from arsenic in the past from  wood that was treated on his deck   CAD Brother 54       stent   Cancer Maternal Uncle  unsure   Stroke Neg Hx    Colon cancer Neg Hx     Social History   Socioeconomic History   Marital status: Married    Spouse name: Not on file   Number of children: 2   Years of education: 12   Highest education level: 12th grade  Occupational History   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[employer]  Tobacco Use   Smoking status: Former    Current packs/day: 0.00    Types: Cigarettes    Quit date: 04/20/1974    Years since quitting: 48.6    Passive exposure: Never   Smokeless tobacco: Never   Tobacco comments:    quit over 20 years  Vaping Use   Vaping status: Never Used  Substance and Sexual Activity   Alcohol use: Yes    Alcohol/week: 3.0 standard drinks of alcohol    Types: 3 Cans of beer per week    Comment: occasional   Drug use: No   Sexual activity: Not on file  Other Topics Concern   Not on file  Social History Narrative   Lives with wife   Occupation: equipment maintenance   Activity: no regular exercise - hunts   Diet: good water, fruits/vegetables   Social Determinants of Health   Financial Resource Strain: Low Risk  (10/22/2022)   Overall Financial Resource Strain (CARDIA)    Difficulty of Paying Living Expenses: Not very hard  Food Insecurity: No Food Insecurity (10/22/2022)   Hunger Vital Sign    Worried About Running Out of Food in the  Last Year: Never true    Ran Out of Food in the Last Year: Never true  Transportation Needs: No Transportation Needs (10/22/2022)   PRAPARE - Administrator, Civil Service (Medical): No    Lack of Transportation (Non-Medical): No  Physical Activity: Insufficiently Active (10/22/2022)   Exercise Vital Sign    Days of Exercise per Week: 1 day    Minutes of Exercise per Session: 30 min  Stress: Stress Concern Present (10/22/2022)   [employer] of Occupational Health - Occupational Stress Questionnaire    Feeling of Stress : To some extent  Social Connections: Moderately Integrated (10/22/2022)   Social Connection and Isolation Panel [NHANES]    Frequency of Communication with Friends and Family: Three times a week    Frequency of Social Gatherings with Friends and Family: Once a week    Attends Religious Services: Never    Database administrator or Organizations: No    Attends Engineer, structural: More than 4 times per year    Marital Status: Married  Catering manager Violence: Not At Risk (10/08/2022)   Humiliation, Afraid, Rape, and Kick questionnaire    Fear of Current or Ex-Partner: No    Emotionally Abused: No    Physically Abused: No    Sexually Abused: No   Review of Systems No N/V Able to eat No change in smell or taste Xarelto in 10/23--second DVT. On prophylaxis now Cough wakes him some    Objective:   Physical Exam Constitutional:      Appearance: Normal appearance.  Pulmonary:     Effort: Pulmonary effort is normal. No respiratory distress.  Neurological:     Mental Status: He is alert.            Assessment & Plan:

## 2022-12-07 ENCOUNTER — Other Ambulatory Visit: Payer: Self-pay

## 2022-12-07 ENCOUNTER — Telehealth: Payer: Self-pay

## 2022-12-07 DIAGNOSIS — Z79899 Other long term (current) drug therapy: Secondary | ICD-10-CM

## 2022-12-07 NOTE — Telephone Encounter (Signed)
Patient needed to repeat BMET. Order placed

## 2022-12-08 LAB — BASIC METABOLIC PANEL
BUN/Creatinine Ratio: 26 — ABNORMAL HIGH (ref 10–24)
BUN: 34 mg/dL — ABNORMAL HIGH (ref 8–27)
CO2: 22 mmol/L (ref 20–29)
Calcium: 10.3 mg/dL — ABNORMAL HIGH (ref 8.6–10.2)
Chloride: 103 mmol/L (ref 96–106)
Creatinine, Ser: 1.29 mg/dL — ABNORMAL HIGH (ref 0.76–1.27)
Glucose: 140 mg/dL — ABNORMAL HIGH (ref 70–99)
Potassium: 4.9 mmol/L (ref 3.5–5.2)
Sodium: 140 mmol/L (ref 134–144)
eGFR: 59 mL/min/{1.73_m2} — ABNORMAL LOW (ref >59)

## 2022-12-16 ENCOUNTER — Ambulatory Visit: Payer: Medicare HMO | Admitting: Family Medicine

## 2022-12-28 ENCOUNTER — Other Ambulatory Visit: Payer: Self-pay

## 2023-01-13 ENCOUNTER — Ambulatory Visit (INDEPENDENT_AMBULATORY_CARE_PROVIDER_SITE_OTHER)
Admission: RE | Admit: 2023-01-13 | Discharge: 2023-01-13 | Disposition: A | Discharge status: Home / Self Care | Payer: Medicare HMO | Source: Ambulatory Visit | Attending: Internal Medicine | Admitting: Internal Medicine

## 2023-01-13 ENCOUNTER — Encounter: Payer: Self-pay | Admitting: Internal Medicine

## 2023-01-13 ENCOUNTER — Ambulatory Visit (INDEPENDENT_AMBULATORY_CARE_PROVIDER_SITE_OTHER): Payer: Medicare HMO | Admitting: Internal Medicine

## 2023-01-13 VITALS — BP 112/68 | HR 91 | Temp 98.0°F | Ht 67.5 in | Wt 169.0 lb

## 2023-01-13 DIAGNOSIS — M79641 Pain in right hand: Secondary | ICD-10-CM | POA: Diagnosis not present

## 2023-01-13 DIAGNOSIS — Z043 Encounter for examination and observation following other accident: Secondary | ICD-10-CM | POA: Diagnosis not present

## 2023-01-13 DIAGNOSIS — E162 Hypoglycemia, unspecified: Secondary | ICD-10-CM | POA: Insufficient documentation

## 2023-01-13 DIAGNOSIS — M19041 Primary osteoarthritis, right hand: Secondary | ICD-10-CM | POA: Diagnosis not present

## 2023-01-13 NOTE — Assessment & Plan Note (Signed)
Did miss lunch but with normal A1c--I recommended he reduce insulin (like from 80-70) He will clear this with his endocrinologist

## 2023-01-13 NOTE — Patient Instructions (Signed)
I recommend you reduce your insulin by 10 units daily (and review this with your endocrinologist) Try topical diclofenac and heat for your hand

## 2023-01-13 NOTE — Assessment & Plan Note (Addendum)
Sig bruising--but can't exclude the possibility of fracture Will check x-ray  X-ray shows no fracture Discussed topical diclofenac and heat Oral analgesics as well

## 2023-01-13 NOTE — Progress Notes (Signed)
Subjective:    Patient ID: Alan Morrison, male    DOB: 1950/11/08, 72 y.o.   MRN: [id number]  HPI Here due to right hand pain  Fell 3PM 8 days ago Thinks he had low sugar reaction---missed lunch and wasn't drinking Never went completely out--just lightheaded  Hit face and right hand was under him Felt pain right away Hoped it would get better---but it hasn't  Bruise over thenar eminence Hard to bend the thumb Pain mostly in thumb--- but also some in 2nd finger. Does have improved ROM in 2nd finger lately  Using tylenol/ibuprofen--gives some help Soaked in epsom salts  Current Outpatient Medications on File Prior to Visit  Medication Sig Dispense Refill   acetaminophen (TYLENOL) 500 MG tablet Take 1,000 mg by mouth 2 (two) times daily as needed for headache (pain).     aspirin EC 81 MG tablet Take 81 mg by mouth daily with supper. Swallow whole.     benzonatate (TESSALON) 200 MG capsule Take 1 capsule (200 mg total) by mouth 3 (three) times daily as needed for cough. 60 capsule 0   Calcium Carbonate Antacid (TUMS PO) Take 1 tablet by mouth daily as needed (acid reflux/indigestion).     Cholecalciferol (VITAMIN D) 2000 units tablet Take 2,000 Units by mouth daily with breakfast.      finasteride (PROSCAR) 5 MG tablet Take 1 tablet (5 mg total) by mouth daily. 90 tablet 4   Insulin Glargine (BASAGLAR KWIKPEN) 100 UNIT/ML Inject 80 Units into the skin at bedtime.     Insulin Pen Needle (B-D ULTRAFINE III SHORT PEN) 31G X 8 MM MISC 1 Device by Other route 4 (four) times daily. USE AS DIRECTED DAILY 120 each 11   Iron, Ferrous Sulfate, 325 (65 Fe) MG TABS Take 325 mg by mouth every other day.     losartan (COZAAR) 25 MG tablet Take 0.5 tablets (12.5 mg total) by mouth daily.     metFORMIN (GLUCOPHAGE) 1000 MG tablet Take 1 tablet (1,000 mg total) by mouth 2 (two) times daily with a meal.     metoprolol tartrate (LOPRESSOR) 25 MG tablet TAKE 0.5 TABLETS BY MOUTH 2 TIMES DAILY. (Patient  taking differently: Take 12.5 mg by mouth 2 (two) times daily.) 90 tablet 2   Multiple Vitamin (MULTIVITAMIN WITH MINERALS) TABS tablet Take 1 tablet by mouth daily with breakfast.      nitroGLYCERIN (NITROSTAT) 0.4 MG SL tablet PLACE 1 TABLET UNDER THE TONGUE EVERY 5 MINUTES AS NEEDED FOR CHEST PAIN (Patient taking differently: Place 0.4 mg under the tongue every 5 (five) minutes as needed for chest pain.) 25 tablet 11   ONETOUCH ULTRA test strip USE TO CHECK SUGARS DAILY  AS DIRECTED 100 strip 3   probenecid (BENEMID) 500 MG tablet Take 0.5 tablets (250 mg total) by mouth every other day. 45 tablet 2   rivaroxaban (XARELTO) 10 MG TABS tablet Take 1 tablet (10 mg total) by mouth daily. 30 tablet 11   rosuvastatin (CRESTOR) 40 MG tablet Take 1 tablet (40 mg total) by mouth daily with supper. 90 tablet 2   tamsulosin (FLOMAX) 0.4 MG CAPS capsule Take 1 capsule (0.4 mg total) by mouth daily. 90 capsule 4   tirzepatide (MOUNJARO) 10 MG/0.5ML Pen Inject 10 mg into the skin once a week. 6 mL 1   No current facility-administered medications on file prior to visit.    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   by [place]   Bilateral pulmonary embolism (HCC) 02/05/2022   Cervical spondylosis without myelopathy    Choroidal nevus 01/22/2011   left, yearly eye exam, no diabetic retinopathy   Diabetes mellitus type II    DVT of deep femoral vein, left (HCC) 04/29/2018   LLE DVT from proximal femoral vein to popliteal vein into proximal calf (04/29/2018) started on xarelto Heme (Magrinat) recommended continuing lifelong lower dose anticoagulant indefinitely (11/2018)   Dyspepsia    Ex-smoker    Fatty liver 02/2016   by [place]   GERD (gastroesophageal reflux disease)     Headache(784.0)    Heart murmur    Hx of   HLD (hyperlipidemia)    Hyperuricemia    Obesity    Sleep apnea    Stress-induced cardiomyopathy 09/27/1998   WNL, EF 64%   Urosepsis 01/01-01/05/10   Hospitalization    Past Surgical History:  Procedure Laterality Date   BIOPSY  10/10/2022   Procedure: BIOPSY;  Surgeon: Imogene Burn, MD;  Location: [facility] ENDOSCOPY;  Service: Gastroenterology;;   CARDIAC CATHETERIZATION     COLONOSCOPY  07/2013   1 benign polyp rpt 10 yrs Arlyce Dice)   COLONOSCOPY WITH PROPOFOL N/A 10/10/2022   Procedure: COLONOSCOPY WITH PROPOFOL;  Surgeon: Imogene Burn, MD;  Location: [facility] ENDOSCOPY;  Service: Gastroenterology;  Laterality: N/A;   CORONARY THROMBECTOMY N/A 11/12/2019   Procedure: Coronary Thrombectomy;  Surgeon: Lyn Records, MD;  Location: [facility] INVASIVE CV LAB;  Service: Cardiovascular;  Laterality: N/A;   CORONARY ULTRASOUND/IVUS N/A 01/30/2022   Procedure: Intravascular Ultrasound/IVUS;  Surgeon: Cephus Shelling, MD;  Location: [facility] INVASIVE CV LAB;  Service: Cardiovascular;  Laterality: N/A;  Venous   CORONARY/GRAFT ACUTE MI REVASCULARIZATION N/A 11/12/2019   Procedure: Coronary/Graft Acute MI Revascularization;  Surgeon: Lyn Records, MD;  Location: [facility] INVASIVE CV LAB;  Service: Cardiovascular;  Laterality: N/A;   KNEE ARTHROSCOPY Left 1988   KNEE SURGERY Right 05/21/03   Right, Dr. August Saucer, med meniscus tear via MRI   LEFT HEART CATH AND CORONARY ANGIOGRAPHY N/A 04/01/2017   Procedure: LEFT HEART CATH AND CORONARY ANGIOGRAPHY;  Surgeon: Swaziland, Peter M, MD;  Location: [facility] INVASIVE CV LAB;  Service: Cardiovascular;  Laterality: N/A;   LEFT HEART CATH AND CORONARY ANGIOGRAPHY N/A 11/12/2019   Procedure: LEFT HEART CATH AND CORONARY ANGIOGRAPHY;  Surgeon: Lyn Records, MD;  Location: [facility] INVASIVE CV LAB;  Service: Cardiovascular;  Laterality: N/A;   MYELOGRAM  08/05   bulging disc   PERIPHERAL VASCULAR BALLOON ANGIOPLASTY  01/30/2022   Procedure:  PERIPHERAL VASCULAR BALLOON ANGIOPLASTY;  Surgeon: Cephus Shelling, MD;  Location: [facility] INVASIVE CV LAB;  Service: Cardiovascular;;   PERIPHERAL VASCULAR THROMBECTOMY Right 01/30/2022   Procedure: PERIPHERAL VASCULAR THROMBECTOMY;  Surgeon: Cephus Shelling, MD;  Location: [facility] INVASIVE CV LAB;  Service: Cardiovascular;  Laterality: Right;    Family History  Problem Relation Age of Onset   Heart disease Mother        CHF   Diabetes Mother    Heart disease Father        CHF   Hypertension Father    Pulmonary fibrosis Sister    Sjogren's syndrome Sister    Migraines Brother        severe headaches from arsenic  in the past from  wood that was treated on his deck   CAD Brother 54       stent   Cancer Maternal Uncle        unsure   Stroke Neg Hx    Colon cancer Neg Hx     Social History   Socioeconomic History   Marital status: Married    Spouse name: Not on file   Number of children: 2   Years of education: 12   Highest education level: 12th grade  Occupational History   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[employer]  Tobacco Use   Smoking status: Former    Current packs/day: 0.00    Types: Cigarettes    Quit date: 04/20/1974    Years since quitting: 48.7    Passive exposure: Never   Smokeless tobacco: Never   Tobacco comments:    quit over 20 years  Vaping Use   Vaping status: Never Used  Substance and Sexual Activity   Alcohol use: Yes    Alcohol/week: 3.0 standard drinks of alcohol    Types: 3 Cans of beer per week    Comment: occasional   Drug use: No   Sexual activity: Not on file  Other Topics Concern   Not on file  Social History Narrative   Lives with wife   Occupation: equipment maintenance   Activity: no regular exercise - hunts   Diet: good water, fruits/vegetables   Social Determinants of Health   Financial Resource Strain: Low Risk  (10/22/2022)   Overall Financial Resource Strain (CARDIA)    Difficulty of Paying Living  Expenses: Not very hard  Food Insecurity: No Food Insecurity (10/22/2022)   Hunger Vital Sign    Worried About Running Out of Food in the Last Year: Never true    Ran Out of Food in the Last Year: Never true  Transportation Needs: No Transportation Needs (10/22/2022)   PRAPARE - Administrator, Civil Service (Medical): No    Lack of Transportation (Non-Medical): No  Physical Activity: Insufficiently Active (10/22/2022)   Exercise Vital Sign    Days of Exercise per Week: 1 day    Minutes of Exercise per Session: 30 min  Stress: Stress Concern Present (10/22/2022)   [employer] of Occupational Health - Occupational Stress Questionnaire    Feeling of Stress : To some extent  Social Connections: Moderately Integrated (10/22/2022)   Social Connection and Isolation Panel [NHANES]    Frequency of Communication with Friends and Family: Three times a week    Frequency of Social Gatherings with Friends and Family: Once a week    Attends Religious Services: Never    Database administrator or Organizations: No    Attends Engineer, structural: More than 4 times per year    Marital Status: Married  Catering manager Violence: Not At Risk (10/08/2022)   Humiliation, Afraid, Rape, and Kick questionnaire    Fear of Current or Ex-Partner: No    Emotionally Abused: No    Physically Abused: No    Sexually Abused: No   Review of Systems Has lost considerable weight with moujaro Last A1c normal     Objective:   Physical Exam Constitutional:      Appearance: Normal appearance.  Musculoskeletal:     Comments: Mild ecchymoses over right thumb CMC Limited ROM in thumb and slightly better in 2nd finger Sig tenderness mostly PIP in thumb and 2nd finger  Neurological:     Mental Status: He is alert.            Assessment & Plan:

## 2023-01-28 ENCOUNTER — Encounter: Payer: Self-pay | Admitting: Internal Medicine

## 2023-01-31 NOTE — Progress Notes (Unsigned)
Alan Encarnacion T. Shannon Kirkendall, MD, CAQ Sports Medicine [facility] at [facility] [address] [place], [address]  Phone: [phone]  FAX: [phone]  Alan Morrison - 72 y.o. male  MRN [id number]  Date of Birth: June 03, 1950  Date: 02/03/2023  PCP: Eustaquio Boyden, MD  Referral: Eustaquio Boyden, MD  No chief complaint on file.  Subjective:   Alan Morrison is a 72 y.o. very pleasant male patient with There is no height or weight on file to calculate BMI. who presents with the following:  He is a pleasant gentleman who is a new patient to me, who presents with some ongoing right-sided hand pain.  Plain x-rays of the right hand are essentially unremarkable.    Review of Systems is noted in the HPI, as appropriate  Objective:   There were no vitals taken for this visit.  GEN: No acute distress; alert,appropriate. PULM: Breathing comfortably in no respiratory distress PSYCH: Normally interactive.   Laboratory and Imaging Data: DG Hand Complete Right  Result Date: 01/13/2023 CLINICAL DATA:  Status post fall. EXAM: RIGHT HAND - COMPLETE 3+ VIEW COMPARISON:  None Available. FINDINGS: No acute fracture or dislocation. No aggressive osseous lesion. Normal alignment. Mild osteoarthritis of the first IP joint. Soft tissue are unremarkable. No radiopaque foreign body or soft tissue emphysema. IMPRESSION: 1. No acute osseous injury of the right hand. Electronically Signed   By: Elige Ko M.D.   On: 01/13/2023 13:43     Assessment and Plan:   ***

## 2023-02-03 ENCOUNTER — Encounter: Payer: Self-pay | Admitting: Family Medicine

## 2023-02-03 ENCOUNTER — Ambulatory Visit (INDEPENDENT_AMBULATORY_CARE_PROVIDER_SITE_OTHER)
Admission: RE | Admit: 2023-02-03 | Discharge: 2023-02-03 | Disposition: A | Discharge status: Home / Self Care | Payer: Medicare HMO | Source: Ambulatory Visit | Attending: Family Medicine | Admitting: Family Medicine

## 2023-02-03 ENCOUNTER — Ambulatory Visit (INDEPENDENT_AMBULATORY_CARE_PROVIDER_SITE_OTHER): Payer: Medicare HMO | Admitting: Family Medicine

## 2023-02-03 VITALS — BP 126/78 | HR 85 | Temp 97.7°F | Ht 67.5 in | Wt 169.1 lb

## 2023-02-03 DIAGNOSIS — M79641 Pain in right hand: Secondary | ICD-10-CM | POA: Diagnosis not present

## 2023-02-03 DIAGNOSIS — M1811 Unilateral primary osteoarthritis of first carpometacarpal joint, right hand: Secondary | ICD-10-CM | POA: Diagnosis not present

## 2023-02-03 DIAGNOSIS — S63641A Sprain of metacarpophalangeal joint of right thumb, initial encounter: Secondary | ICD-10-CM

## 2023-02-03 DIAGNOSIS — M25541 Pain in joints of right hand: Secondary | ICD-10-CM | POA: Diagnosis not present

## 2023-02-15 ENCOUNTER — Other Ambulatory Visit: Payer: Self-pay

## 2023-02-15 MED ORDER — MOUNJARO 10 MG/0.5ML ~~LOC~~ SOAJ
10.0000 mg | SUBCUTANEOUS | 0 refills | Status: DC
  Filled 2023-02-15: qty 6, 84d supply, fill #0

## 2023-02-18 DIAGNOSIS — E1129 Type 2 diabetes mellitus with other diabetic kidney complication: Secondary | ICD-10-CM | POA: Diagnosis not present

## 2023-02-18 DIAGNOSIS — Z794 Long term (current) use of insulin: Secondary | ICD-10-CM | POA: Diagnosis not present

## 2023-02-18 DIAGNOSIS — E1159 Type 2 diabetes mellitus with other circulatory complications: Secondary | ICD-10-CM | POA: Diagnosis not present

## 2023-02-18 DIAGNOSIS — E782 Mixed hyperlipidemia: Secondary | ICD-10-CM | POA: Diagnosis not present

## 2023-02-18 DIAGNOSIS — N1832 Chronic kidney disease, stage 3b: Secondary | ICD-10-CM | POA: Diagnosis not present

## 2023-02-18 DIAGNOSIS — R809 Proteinuria, unspecified: Secondary | ICD-10-CM | POA: Diagnosis not present

## 2023-02-18 DIAGNOSIS — E1122 Type 2 diabetes mellitus with diabetic chronic kidney disease: Secondary | ICD-10-CM | POA: Diagnosis not present

## 2023-02-18 DIAGNOSIS — I10 Essential (primary) hypertension: Secondary | ICD-10-CM | POA: Diagnosis not present

## 2023-02-23 NOTE — Progress Notes (Unsigned)
    Rei Medlen T. Maguadalupe Lata, MD, CAQ Sports Medicine [facility] at [facility] [address] [place], [address]  Phone: [phone]  FAX: [phone]  Alan Morrison - 72 y.o. male  MRN [id number]  Date of Birth: 1951/03/25  Date: 02/24/2023  PCP: Eustaquio Boyden, MD  Referral: Eustaquio Boyden, MD  No chief complaint on file.  Subjective:   Alan Morrison is a 72 y.o. very pleasant male patient with There is no height or weight on file to calculate BMI. who presents with the following:  The patient is here for follow-up after I saw him on February 03, 2023, for an acute gamekeeper's thumb.  At that point I did place him in a thumb spica splint on the right side.  He is here today to follow-up on his thumb injury.  Prior plain x-rays did not show any fracture.    Review of Systems is noted in the HPI, as appropriate  Objective:   There were no vitals taken for this visit.  GEN: No acute distress; alert,appropriate. PULM: Breathing comfortably in no respiratory distress PSYCH: Normally interactive.   Laboratory and Imaging Data:  Assessment and Plan:   ***

## 2023-02-24 ENCOUNTER — Encounter: Payer: Self-pay | Admitting: Family Medicine

## 2023-02-24 ENCOUNTER — Ambulatory Visit (INDEPENDENT_AMBULATORY_CARE_PROVIDER_SITE_OTHER): Payer: Medicare HMO | Admitting: Family Medicine

## 2023-02-24 VITALS — BP 108/60 | HR 90 | Temp 97.8°F | Ht 67.5 in | Wt 171.4 lb

## 2023-02-24 DIAGNOSIS — M79641 Pain in right hand: Secondary | ICD-10-CM

## 2023-02-24 DIAGNOSIS — S63641D Sprain of metacarpophalangeal joint of right thumb, subsequent encounter: Secondary | ICD-10-CM

## 2023-03-27 ENCOUNTER — Telehealth: Payer: Self-pay | Admitting: Family Medicine

## 2023-03-27 MED ORDER — ROSUVASTATIN CALCIUM 40 MG PO TABS: 40.0000 mg | ORAL_TABLET | Freq: Every day | ORAL | 0 refills | Status: DC

## 2023-03-27 NOTE — Telephone Encounter (Signed)
Received message from insurance that pt is not taking statin. On med list he has Crestor 40mg  which he should be taking daily from cardiologist Dr Swaziland however last  filled 09/2021 for 9 months - please call to verify he continues taking this. I have refilled a 3 month supply to [facility] until his next Cardiology appt.

## 2023-03-29 NOTE — Telephone Encounter (Addendum)
Spoke with pt relaying Dr Timoteo Expose message and asked about Crestor rx. Pt confirms he's taking med daily as prescribed. But the pharmacy was filling it so quickly, that he had a lot on hand and he just picked up recent refill from Dr Reece Agar.

## 2023-04-11 ENCOUNTER — Other Ambulatory Visit: Payer: Self-pay | Admitting: Cardiology

## 2023-05-20 NOTE — Progress Notes (Signed)
Cardiology Office Note:    Date:  05/24/2023   ID:  Rafal Archuleta, DOB 11/05/1950, MRN [id number]  PCP:  Eustaquio Boyden, MD  [facility] Cardiologist:  Khalifa Knecht [place], MD  [facility] Electrophysiologist:  None   Referring MD: Eustaquio Boyden, MD   No chief complaint on file.   History of Present Illness:    Alan Morrison is a 73 y.o. male with a hx of HLD, DM II, stress cardiomyopathy, OSA on CPAP, history of DVT on Xarelto, former smoker, and CAD.  Patient had nonobstructive CAD by angiography in 2018.  More recently, he presented with inferior wall acute STEMI on 11/12/2019.  Initial EKG did identify inferior Q waves with persistent ST elevation in the inferior lead.  Cardiac catheterization performed on 11/12/2019 showed total occlusion of proximal to mid RCA treated with 3.0 x 22 mm Onyx DES postdilated to 3.5 mm, 30% mid PDA vessel lesion, widely patent left main, 50% proximal to mid LAD lesion, EF 55%, LVEDP 16 mmHg.  Postprocedure, patient was placed on triple therapy including aspirin, Plavix and Xarelto.  The plan is to discontinue aspirin after 1 month.  Echocardiogram showed EF 45 to 50%.  During the hospitalization patient also had frequent hypoglycemic spells at home, he was not very compliant with insulin at home which explains the hypoglycemic episodes when he was placed on the full-strength insulin.  Lovenox was stopped and he was discharged on long-acting insulin only with plan to be evaluated by his endocrinologist for further medication adjustment.  He was seen by Dr. Darnelle Catalan on 11/20/2019 who discontinued the Xarelto. He did recommend repeating a D dimer at some point.   He was then admitted in October 2023 with worsening swelling and pain in RLE. LE dopplers this time showed occlusive thrombus in the external iliac, common femoral, superficial femoral, and deep femoral veins on the right -vascular surgery consulted, patient was taken to the OR on 10/13 and he is status  post percutaneous mechanical venous thrombectomy of the right popliteal, superficial femoral, common femoral, iliac vein and distal IVC; Percutaneous mechanical venous thrombectomy of the right popliteal, superficial femoral, common femoral, iliac vein and distal IVC; Angioplasty of the right iliac vein and distal IVC (10 mm x 60 mm Mustang).  He recovered well postoperatively, leg swelling has improved significantly, he is able to ambulate in the hallway without significant difficulties.  He is now on long term anticoagulation.  2D echo shows an EF of 50-55%, grade 1 diastolic dysfunction, RV moderately enlarged with normal systolic function.    Repeat Echo 05/04/22 showed stable EF with resolution of RV strain/dysfunction.  On follow up today he is doing very well. Is now on Mounjaro with 50 lb weight loss. A1c down to 5.2%. no chest pain or dyspnea. Did have a passing out spell in October felt to be related to low sugar vs dehydration.    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   by [place]   Bilateral pulmonary embolism (HCC) 02/05/2022   Cervical spondylosis without myelopathy    Choroidal nevus 01/22/2011   left, yearly eye exam, no diabetic retinopathy   Diabetes mellitus type II    DVT of deep femoral vein, left (HCC) 04/29/2018   LLE DVT from proximal  femoral vein to popliteal vein into proximal calf (04/29/2018) started on xarelto Heme (Magrinat) recommended continuing lifelong lower dose anticoagulant indefinitely (11/2018)   Dyspepsia    Ex-smoker    Fatty liver 02/2016   by [place]   GERD (gastroesophageal reflux disease)    Headache(784.0)    Heart murmur    Hx of   HLD (hyperlipidemia)    Hyperuricemia    Obesity    Sleep apnea    Stress-induced cardiomyopathy  09/27/1998   WNL, EF 64%   Urosepsis 01/01-01/05/10   Hospitalization    Past Surgical History:  Procedure Laterality Date   BIOPSY  10/10/2022   Procedure: BIOPSY;  Surgeon: Imogene Burn, MD;  Location: [facility] ENDOSCOPY;  Service: Gastroenterology;;   CARDIAC CATHETERIZATION     COLONOSCOPY  07/2013   1 benign polyp rpt 10 yrs Arlyce Dice)   COLONOSCOPY WITH PROPOFOL N/A 10/10/2022   Procedure: COLONOSCOPY WITH PROPOFOL;  Surgeon: Imogene Burn, MD;  Location: [facility] ENDOSCOPY;  Service: Gastroenterology;  Laterality: N/A;   CORONARY THROMBECTOMY N/A 11/12/2019   Procedure: Coronary Thrombectomy;  Surgeon: Lyn Records, MD;  Location: [facility] INVASIVE CV LAB;  Service: Cardiovascular;  Laterality: N/A;   CORONARY ULTRASOUND/IVUS N/A 01/30/2022   Procedure: Intravascular Ultrasound/IVUS;  Surgeon: Cephus Shelling, MD;  Location: [facility] INVASIVE CV LAB;  Service: Cardiovascular;  Laterality: N/A;  Venous   CORONARY/GRAFT ACUTE MI REVASCULARIZATION N/A 11/12/2019   Procedure: Coronary/Graft Acute MI Revascularization;  Surgeon: Lyn Records, MD;  Location: [facility] INVASIVE CV LAB;  Service: Cardiovascular;  Laterality: N/A;   KNEE ARTHROSCOPY Left 1988   KNEE SURGERY Right 05/21/03   Right, Dr. August Saucer, med meniscus tear via MRI   LEFT HEART CATH AND CORONARY ANGIOGRAPHY N/A 04/01/2017   Procedure: LEFT HEART CATH AND CORONARY ANGIOGRAPHY;  Surgeon: [place], Javel Hersh M, MD;  Location: [facility] INVASIVE CV LAB;  Service: Cardiovascular;  Laterality: N/A;   LEFT HEART CATH AND CORONARY ANGIOGRAPHY N/A 11/12/2019   Procedure: LEFT HEART CATH AND CORONARY ANGIOGRAPHY;  Surgeon: Lyn Records, MD;  Location: [facility] INVASIVE CV LAB;  Service: Cardiovascular;  Laterality: N/A;   MYELOGRAM  08/05   bulging disc   PERIPHERAL VASCULAR BALLOON ANGIOPLASTY  01/30/2022   Procedure: PERIPHERAL VASCULAR BALLOON ANGIOPLASTY;  Surgeon: Cephus Shelling, MD;  Location: [facility] INVASIVE CV LAB;  Service: Cardiovascular;;   PERIPHERAL  VASCULAR THROMBECTOMY Right 01/30/2022   Procedure: PERIPHERAL VASCULAR THROMBECTOMY;  Surgeon: Cephus Shelling, MD;  Location: [facility] INVASIVE CV LAB;  Service: Cardiovascular;  Laterality: Right;    Current Medications: Current Meds  Medication Sig   acetaminophen (TYLENOL) 500 MG tablet Take 1,000 mg by mouth 2 (two) times daily as needed for headache (pain).   aspirin EC 81 MG tablet Take 81 mg by mouth daily with supper. Swallow whole.   Calcium Carbonate Antacid (TUMS PO) Take 1 tablet by mouth daily as needed (acid reflux/indigestion).   Cholecalciferol (VITAMIN D) 2000 units tablet Take 2,000 Units by mouth daily with breakfast.    finasteride (PROSCAR) 5 MG tablet Take 1 tablet (5 mg total) by mouth daily.   Insulin Glargine (BASAGLAR KWIKPEN) 100 UNIT/ML Inject 80 Units into the skin at bedtime.   Insulin Pen Needle (B-D ULTRAFINE III SHORT PEN) 31G X 8 MM MISC 1 Device by Other route 4 (four) times daily. USE AS DIRECTED DAILY   Iron, Ferrous Sulfate, 325 (65 Fe) MG TABS Take 325 mg by mouth every other day.   losartan (COZAAR)  25 MG tablet Take 0.5 tablets (12.5 mg total) by mouth daily.   metFORMIN (GLUCOPHAGE) 1000 MG tablet Take 1 tablet (1,000 mg total) by mouth 2 (two) times daily with a meal.   Multiple Vitamin (MULTIVITAMIN WITH MINERALS) TABS tablet Take 1 tablet by mouth daily with breakfast.    nitroGLYCERIN (NITROSTAT) 0.4 MG SL tablet PLACE 1 TABLET UNDER THE TONGUE EVERY 5 MINUTES AS NEEDED FOR CHEST PAIN   ONETOUCH ULTRA test strip USE TO CHECK SUGARS DAILY  AS DIRECTED   probenecid (BENEMID) 500 MG tablet Take 0.5 tablets (250 mg total) by mouth every other day.   rivaroxaban (XARELTO) 10 MG TABS tablet Take 1 tablet (10 mg total) by mouth daily.   tamsulosin (FLOMAX) 0.4 MG CAPS capsule Take 1 capsule (0.4 mg total) by mouth daily.   tirzepatide (MOUNJARO) 10 MG/0.5ML Pen Inject 0.5 mL (10 mg total) subcutaneously into the skin once a week.   [DISCONTINUED]  metoprolol tartrate (LOPRESSOR) 25 MG tablet TAKE 1/2 TABLET TWICE A DAY BY MOUTH   [DISCONTINUED] rosuvastatin (CRESTOR) 40 MG tablet Take 1 tablet (40 mg total) by mouth daily with supper.     Allergies:   Patient has no known allergies.   Social History   Socioeconomic History   Marital status: Married    Spouse name: Not on file   Number of children: 2   Years of education: 12   Highest education level: Associate degree: occupational, Scientist, product/process development, or vocational program  Occupational History   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[employer]  Tobacco Use   Smoking status: Former    Current packs/day: 0.00    Types: Cigarettes    Quit date: 04/20/1974    Years since quitting: 49.1    Passive exposure: Never   Smokeless tobacco: Never   Tobacco comments:    quit over 20 years  Vaping Use   Vaping status: Never Used  Substance and Sexual Activity   Alcohol use: Yes    Alcohol/week: 3.0 standard drinks of alcohol    Types: 3 Cans of beer per week    Comment: occasional   Drug use: No   Sexual activity: Yes    Partners: Female    Comment: MARRIED  Other Topics Concern   Not on file  Social History Narrative   Lives with wife   Occupation: equipment maintenance   Activity: no regular exercise - hunts   Diet: good water, fruits/vegetables   Social Drivers of Corporate investment banker Strain: Low Risk  (01/29/2023)   Overall Financial Resource Strain (CARDIA)    Difficulty of Paying Living Expenses: Not hard at all  Food Insecurity: No Food Insecurity (01/29/2023)   Hunger Vital Sign    Worried About Running Out of Food in the Last Year: Never true    Ran Out of Food in the Last Year: Never true  Transportation Needs: No Transportation Needs (01/29/2023)   PRAPARE - Administrator, Civil Service (Medical): No    Lack of Transportation (Non-Medical): No  Physical Activity: Insufficiently Active (01/29/2023)   Exercise Vital Sign     Days of Exercise per Week: 2 days    Minutes of Exercise per Session: 30 min  Stress: No Stress Concern Present (01/29/2023)   [employer] of Occupational Health - Occupational Stress Questionnaire    Feeling of Stress : Only a little  Social Connections: Moderately Integrated (01/29/2023)   Social Connection and Isolation Panel [NHANES]  Frequency of Communication with Friends and Family: More than three times a week    Frequency of Social Gatherings with Friends and Family: Once a week    Attends Religious Services: Never    Diplomatic Services operational officer: No    Attends Engineer, structural: More than 4 times per year    Marital Status: Married     Family History: The patient's family history includes CAD (age of onset: 38) in his brother; Cancer in his maternal uncle; Diabetes in his mother; Heart disease in his father and mother; Hypertension in his father; Migraines in his brother; Pulmonary fibrosis in his sister; Sjogren's syndrome in his sister. There is no history of Stroke or Colon cancer.  ROS:   Please see the history of present illness.     All other systems reviewed and are negative.  EKGs/Labs/Other Studies Reviewed:    The following studies were reviewed today:  Cath 11/12/2019 A stent was successfully placed.   Acute inferior ST elevation myocardial infarction with ongoing pain for greater than 9 hours. Totally occluded proximal to mid RCA treated with 22 x 3.0 Onyx DES postdilated to 3.5 mm with TIMI grade III flow and resolution of symptoms.  The PDA contains segmental mid vessel 50 percent stenosis. Widely patent, short left main Widely patent LAD with proximal to mid eccentric 50 percent stenosis and luminal irregularities beyond. Widely patent circumflex with luminal irregularities. LV reveals inferior wall hypokinesis.  EF 55 percent.  LVEDP 16 mmHg.   RECOMMENDATIONS:   Aggressive risk factor modification. If A1c significantly  elevated, consider adding SGLT2 therapy. Lipid panel and if LDL greater than 70, intensify management by adding ezetimibe or PCSK9. Phase 2 cardiac rehab.  Echo 01/30/22: IMPRESSIONS     1. Left ventricular ejection fraction, by estimation, is 50 to 55%. The  left ventricle has low normal function. The left ventricle has no regional  wall motion abnormalities. There is moderate concentric left ventricular  hypertrophy. Left ventricular  diastolic parameters are consistent with Grade I diastolic dysfunction  (impaired relaxation). There is the interventricular septum is flattened  in systole, consistent with right ventricular pressure overload.   2. +Mconnell's sign. Right ventricular systolic function is normal. The  right ventricular size is moderately enlarged.   3. No evidence of mitral valve regurgitation.   4. The aortic valve is tricuspid. Aortic valve regurgitation is not  visualized.   5. The inferior vena cava is normal in size with greater than 50%  respiratory variability, suggesting right atrial pressure of 3 mmHg.   Conclusion(s)/Recommendation(s): RV dilation and septal flattening  consistent with pulmonary embolism with RV strain.    Echo 05/04/22: IMPRESSIONS     1. Left ventricular ejection fraction, by estimation, is 50 to 55%. The  left ventricle has low normal function. The left ventricle has no regional  wall motion abnormalities. There is mild concentric left ventricular  hypertrophy. Left ventricular  diastolic parameters are consistent with Grade I diastolic dysfunction  (impaired relaxation).   2. Right ventricular systolic function is normal. The right ventricular  size is normal. Tricuspid regurgitation signal is inadequate for assessing  PA pressure.   3. The mitral valve is normal in structure. Trivial mitral valve  regurgitation. No evidence of mitral stenosis.   4. The aortic valve is tricuspid. Aortic valve regurgitation is not  visualized.  Aortic valve sclerosis/calcification is present, without any  evidence of aortic stenosis.   5. The inferior vena cava  is normal in size with greater than 50%  respiratory variability, suggesting right atrial pressure of 3 mmHg.     EKG Interpretation Date/Time:  Monday May 24 2023 10:08:04 EST Ventricular Rate:  72 PR Interval:  196 QRS Duration:  94 QT Interval:  362 QTC Calculation: 396 R Axis:   -27  Text Interpretation: Normal sinus rhythm Inferior infarct , age undetermined When compared with ECG of 03-Jan-2022 01:24, No significant change was found Confirmed by [place], Meloni Hinz [id number]) on 05/24/2023 10:35:23 AM    Recent Labs: 10/26/2022: ALT 35; Hemoglobin 14.8; Platelets 258.0 12/07/2022: BUN 34; Creatinine, Ser 1.29; Potassium 4.9; Sodium 140  Recent Lipid Panel    Component Value Date/Time   CHOL 100 06/10/2022 0726   CHOL 112 11/01/2018 0000   TRIG 102.0 06/10/2022 0726   HDL 31.40 (L) 06/10/2022 0726   HDL 37 (L) 11/01/2018 0000   CHOLHDL 3 06/10/2022 0726   VLDL 20.4 06/10/2022 0726   LDLCALC 48 06/10/2022 0726   LDLCALC 59 11/01/2018 0000    Physical Exam:    VS:  BP 105/68   Pulse 72   Ht 5\' 7"  (1.702 m)   Wt 173 lb 9.6 oz (78.7 kg)   SpO2 95%   BMI 27.19 kg/m     Wt Readings from Last 3 Encounters:  05/24/23 173 lb 9.6 oz (78.7 kg)  02/24/23 171 lb 6 oz (77.7 kg)  02/03/23 169 lb 2 oz (76.7 kg)     GEN:  Well nourished, obese, in no acute distress HEENT: Normal NECK: No JVD; No carotid bruits LYMPHATICS: No lymphadenopathy CARDIAC: RRR, no murmurs, rubs, gallops RESPIRATORY:  Clear to auscultation without rales, wheezing or rhonchi  ABDOMEN: Soft, non-tender, non-distended MUSCULOSKELETAL:  minimal edema right calf. Compression hose in place. Some tenderness to  SKIN: Warm and dry NEUROLOGIC:  Alert and oriented x 3 PSYCHIATRIC:  Normal affect   ASSESSMENT:    1. Coronary artery disease involving native coronary artery of native heart  without angina pectoris   2. Essential hypertension, benign   3. Controlled type 2 diabetes mellitus with complication, with long-term current use of insulin (HCC)   4. Hyperlipidemia LDL goal <70       PLAN:    In order of problems listed above:  CAD: s/p  cardiac catheterization for inferior STEMI and underwent DES to RCA in July 2021. He is asymptomatic.  Continue metoprolol and statin.  Lifestyle modification.   Hypertension: well controlled on current therapy  Hyperlipidemia: last LDL excellent at 51  DM2: significantly improved on Mounjaro.   History of DVT/PE: with documented recurrence in October with RLE DVT and PE. S/p thrombectomy. Now back on Xarelto indefinitely.   Follow up in one year    Medication Adjustments/Labs and Tests Ordered: Current medicines are reviewed at length with the patient today.  Concerns regarding medicines are outlined above.  Orders Placed This Encounter  Procedures   EKG 12-Lead   Meds ordered this encounter  Medications   rosuvastatin (CRESTOR) 40 MG tablet    Sig: Take 1 tablet (40 mg total) by mouth daily with supper.    Dispense:  90 tablet    Refill:  3    Future refills through cardiology   metoprolol tartrate (LOPRESSOR) 25 MG tablet    Sig: TAKE 1/2 TABLET TWICE A DAY BY MOUTH    Dispense:  90 tablet    Refill:  3    Patient Instructions  Medication Instructions:  Continue same  medications *If you need a refill on your cardiac medications before your next appointment, please call your pharmacy*   Lab Work: None ordered   Testing/Procedures: None ordered   Follow-Up: At [facility], you and your health needs are our priority.  As part of our continuing mission to provide you with exceptional heart care, we have created designated Provider Care Teams.  These Care Teams include your primary Cardiologist (physician) and Advanced Practice Providers (APPs -  Physician Assistants and Nurse Practitioners) who  all work together to provide you with the care you need, when you need it.  We recommend signing up for the patient portal called "MyChart".  Sign up information is provided on this After Visit Summary.  MyChart is used to connect with patients for Virtual Visits (Telemedicine).  Patients are able to view lab/test results, encounter notes, upcoming appointments, etc.  Non-urgent messages can be sent to your provider as well.   To learn more about what you can do with MyChart, go to [email or web address].    Your next appointment:  1 year    Call in Dec to schedule Feb appointment     Provider:  Dr.Ohn Bostic          Signed, Nowell Sites [place], MD  05/24/2023 10:43 AM    [facility]

## 2023-05-24 ENCOUNTER — Encounter: Payer: Self-pay | Admitting: Cardiology

## 2023-05-24 ENCOUNTER — Ambulatory Visit: Payer: Medicare HMO | Attending: Cardiology | Admitting: Cardiology

## 2023-05-24 VITALS — BP 105/68 | HR 72 | Ht 67.0 in | Wt 173.6 lb

## 2023-05-24 DIAGNOSIS — I10 Essential (primary) hypertension: Secondary | ICD-10-CM | POA: Diagnosis not present

## 2023-05-24 DIAGNOSIS — Z794 Long term (current) use of insulin: Secondary | ICD-10-CM | POA: Diagnosis not present

## 2023-05-24 DIAGNOSIS — I2510 Atherosclerotic heart disease of native coronary artery without angina pectoris: Secondary | ICD-10-CM

## 2023-05-24 DIAGNOSIS — E785 Hyperlipidemia, unspecified: Secondary | ICD-10-CM

## 2023-05-24 DIAGNOSIS — E118 Type 2 diabetes mellitus with unspecified complications: Secondary | ICD-10-CM | POA: Diagnosis not present

## 2023-05-24 MED ORDER — ROSUVASTATIN CALCIUM 40 MG PO TABS: 40.0000 mg | ORAL_TABLET | Freq: Every day | ORAL | 3 refills | Status: AC

## 2023-05-24 MED ORDER — METOPROLOL TARTRATE 25 MG PO TABS: ORAL_TABLET | ORAL | 3 refills | Status: AC

## 2023-05-24 NOTE — Patient Instructions (Signed)
Medication Instructions:  Continue same medications *If you need a refill on your cardiac medications before your next appointment, please call your pharmacy*   Lab Work: None ordered   Testing/Procedures: None ordered   Follow-Up: At [facility], you and your health needs are our priority.  As part of our continuing mission to provide you with exceptional heart care, we have created designated Provider Care Teams.  These Care Teams include your primary Cardiologist (physician) and Advanced Practice Providers (APPs -  Physician Assistants and Nurse Practitioners) who all work together to provide you with the care you need, when you need it.  We recommend signing up for the patient portal called "MyChart".  Sign up information is provided on this After Visit Summary.  MyChart is used to connect with patients for Virtual Visits (Telemedicine).  Patients are able to view lab/test results, encounter notes, upcoming appointments, etc.  Non-urgent messages can be sent to your provider as well.   To learn more about what you can do with MyChart, go to [email or web address].    Your next appointment:  1 year    Call in Dec to schedule Feb appointment     Provider:  Dr.Jordan

## 2023-06-02 DIAGNOSIS — E119 Type 2 diabetes mellitus without complications: Secondary | ICD-10-CM | POA: Diagnosis not present

## 2023-06-02 DIAGNOSIS — H53143 Visual discomfort, bilateral: Secondary | ICD-10-CM | POA: Diagnosis not present

## 2023-06-02 DIAGNOSIS — H5203 Hypermetropia, bilateral: Secondary | ICD-10-CM | POA: Diagnosis not present

## 2023-06-10 ENCOUNTER — Other Ambulatory Visit: Payer: Self-pay | Admitting: Family Medicine

## 2023-06-10 DIAGNOSIS — N1831 Chronic kidney disease, stage 3a: Secondary | ICD-10-CM

## 2023-06-10 DIAGNOSIS — E559 Vitamin D deficiency, unspecified: Secondary | ICD-10-CM

## 2023-06-10 DIAGNOSIS — N138 Other obstructive and reflux uropathy: Secondary | ICD-10-CM

## 2023-06-10 DIAGNOSIS — E1169 Type 2 diabetes mellitus with other specified complication: Secondary | ICD-10-CM

## 2023-06-10 DIAGNOSIS — E790 Hyperuricemia without signs of inflammatory arthritis and tophaceous disease: Secondary | ICD-10-CM

## 2023-06-10 DIAGNOSIS — D508 Other iron deficiency anemias: Secondary | ICD-10-CM

## 2023-06-15 ENCOUNTER — Other Ambulatory Visit (INDEPENDENT_AMBULATORY_CARE_PROVIDER_SITE_OTHER): Payer: Medicare HMO

## 2023-06-15 DIAGNOSIS — N1831 Chronic kidney disease, stage 3a: Secondary | ICD-10-CM

## 2023-06-15 DIAGNOSIS — E559 Vitamin D deficiency, unspecified: Secondary | ICD-10-CM

## 2023-06-15 DIAGNOSIS — N138 Other obstructive and reflux uropathy: Secondary | ICD-10-CM

## 2023-06-15 DIAGNOSIS — Z794 Long term (current) use of insulin: Secondary | ICD-10-CM

## 2023-06-15 DIAGNOSIS — N401 Enlarged prostate with lower urinary tract symptoms: Secondary | ICD-10-CM | POA: Diagnosis not present

## 2023-06-15 DIAGNOSIS — E790 Hyperuricemia without signs of inflammatory arthritis and tophaceous disease: Secondary | ICD-10-CM | POA: Diagnosis not present

## 2023-06-15 DIAGNOSIS — E1169 Type 2 diabetes mellitus with other specified complication: Secondary | ICD-10-CM | POA: Diagnosis not present

## 2023-06-15 DIAGNOSIS — D508 Other iron deficiency anemias: Secondary | ICD-10-CM | POA: Diagnosis not present

## 2023-06-15 DIAGNOSIS — E785 Hyperlipidemia, unspecified: Secondary | ICD-10-CM | POA: Diagnosis not present

## 2023-06-15 DIAGNOSIS — E1122 Type 2 diabetes mellitus with diabetic chronic kidney disease: Secondary | ICD-10-CM

## 2023-06-15 LAB — URIC ACID: Uric Acid, Serum: 6.4 mg/dL (ref 4.0–7.8)

## 2023-06-15 LAB — PSA: PSA: 0.48 ng/mL (ref 0.10–4.00)

## 2023-06-15 LAB — CBC WITH DIFFERENTIAL/PLATELET
Basophils Absolute: 0.1 10*3/uL (ref 0.0–0.1)
Basophils Relative: 0.9 % (ref 0.0–3.0)
Eosinophils Absolute: 0.2 10*3/uL (ref 0.0–0.7)
Eosinophils Relative: 2.9 % (ref 0.0–5.0)
HCT: 43.8 % (ref 39.0–52.0)
Hemoglobin: 14.7 g/dL (ref 13.0–17.0)
Lymphocytes Relative: 26.4 % (ref 12.0–46.0)
Lymphs Abs: 1.7 10*3/uL (ref 0.7–4.0)
MCHC: 33.6 g/dL (ref 30.0–36.0)
MCV: 94.6 fL (ref 78.0–100.0)
Monocytes Absolute: 0.5 10*3/uL (ref 0.1–1.0)
Monocytes Relative: 7.2 % (ref 3.0–12.0)
Neutro Abs: 3.9 10*3/uL (ref 1.4–7.7)
Neutrophils Relative %: 62.6 % (ref 43.0–77.0)
Platelets: 194 10*3/uL (ref 150.0–400.0)
RBC: 4.63 Mil/uL (ref 4.22–5.81)
RDW: 13.4 % (ref 11.5–15.5)
WBC: 6.3 10*3/uL (ref 4.0–10.5)

## 2023-06-15 LAB — COMPREHENSIVE METABOLIC PANEL
ALT: 21 U/L (ref 0–53)
AST: 27 U/L (ref 0–37)
Albumin: 4.1 g/dL (ref 3.5–5.2)
Alkaline Phosphatase: 46 U/L (ref 39–117)
BUN: 40 mg/dL — ABNORMAL HIGH (ref 6–23)
CO2: 28 meq/L (ref 19–32)
Calcium: 10 mg/dL (ref 8.4–10.5)
Chloride: 107 meq/L (ref 96–112)
Creatinine, Ser: 1.48 mg/dL (ref 0.40–1.50)
GFR: 47.14 mL/min — ABNORMAL LOW (ref >60.00)
Glucose, Bld: 82 mg/dL (ref 70–99)
Potassium: 4.9 meq/L (ref 3.5–5.1)
Sodium: 143 meq/L (ref 135–145)
Total Bilirubin: 0.6 mg/dL (ref 0.2–1.2)
Total Protein: 6.8 g/dL (ref 6.0–8.3)

## 2023-06-15 LAB — LIPID PANEL
Cholesterol: 99 mg/dL (ref 0–200)
HDL: 29.2 mg/dL — ABNORMAL LOW (ref >39.00)
LDL Cholesterol: 50 mg/dL (ref 0–99)
NonHDL: 69.67
Total CHOL/HDL Ratio: 3
Triglycerides: 98 mg/dL (ref 0.0–149.0)
VLDL: 19.6 mg/dL (ref 0.0–40.0)

## 2023-06-15 LAB — MICROALBUMIN / CREATININE URINE RATIO
Creatinine,U: 157.9 mg/dL
Microalb Creat Ratio: 40.7 mg/g — ABNORMAL HIGH (ref 0.0–30.0)
Microalb, Ur: 6.4 mg/dL — ABNORMAL HIGH (ref 0.0–1.9)

## 2023-06-15 LAB — VITAMIN B12: Vitamin B-12: 297 pg/mL (ref 211–911)

## 2023-06-15 LAB — HEMOGLOBIN A1C: Hgb A1c MFr Bld: 5.4 % (ref 4.6–6.5)

## 2023-06-15 LAB — VITAMIN D 25 HYDROXY (VIT D DEFICIENCY, FRACTURES): VITD: 55.74 ng/mL (ref 30.00–100.00)

## 2023-06-18 ENCOUNTER — Other Ambulatory Visit: Payer: Self-pay | Admitting: Family Medicine

## 2023-06-18 ENCOUNTER — Encounter: Payer: Self-pay | Admitting: Family Medicine

## 2023-06-18 DIAGNOSIS — I82421 Acute embolism and thrombosis of right iliac vein: Secondary | ICD-10-CM

## 2023-06-18 NOTE — Telephone Encounter (Signed)
 Duplicate request (see today's refill note).

## 2023-06-18 NOTE — Telephone Encounter (Signed)
 Will fwd request to Dr Para March to send rx today due to Dr Reece Agar out of office and pt needs refill ASAP (see today's MyChart msg).

## 2023-06-18 NOTE — Telephone Encounter (Signed)
 Xarelto Last filled:  05/17/23, #30 Last OV:  10/26/22, hosp f/u Next OV:  06/22/23, CPE

## 2023-06-18 NOTE — Telephone Encounter (Signed)
 Sent. Thanks.  Please notify pt.

## 2023-06-22 ENCOUNTER — Ambulatory Visit (INDEPENDENT_AMBULATORY_CARE_PROVIDER_SITE_OTHER): Payer: Medicare HMO | Admitting: Family Medicine

## 2023-06-22 ENCOUNTER — Encounter: Payer: Self-pay | Admitting: Family Medicine

## 2023-06-22 ENCOUNTER — Ambulatory Visit
Admission: RE | Admit: 2023-06-22 | Discharge: 2023-06-22 | Disposition: A | Discharge status: Home / Self Care | Source: Ambulatory Visit | Attending: Family Medicine | Admitting: Family Medicine

## 2023-06-22 VITALS — BP 116/66 | HR 83 | Temp 97.9°F | Ht 66.25 in | Wt 165.2 lb

## 2023-06-22 DIAGNOSIS — M5030 Other cervical disc degeneration, unspecified cervical region: Secondary | ICD-10-CM

## 2023-06-22 DIAGNOSIS — Z6825 Body mass index (BMI) 25.0-25.9, adult: Secondary | ICD-10-CM | POA: Diagnosis not present

## 2023-06-22 DIAGNOSIS — E559 Vitamin D deficiency, unspecified: Secondary | ICD-10-CM

## 2023-06-22 DIAGNOSIS — Z0000 Encounter for general adult medical examination without abnormal findings: Secondary | ICD-10-CM

## 2023-06-22 DIAGNOSIS — R42 Dizziness and giddiness: Secondary | ICD-10-CM

## 2023-06-22 DIAGNOSIS — M4319 Spondylolisthesis, multiple sites in spine: Secondary | ICD-10-CM | POA: Diagnosis not present

## 2023-06-22 DIAGNOSIS — N1831 Chronic kidney disease, stage 3a: Secondary | ICD-10-CM | POA: Diagnosis not present

## 2023-06-22 DIAGNOSIS — E790 Hyperuricemia without signs of inflammatory arthritis and tophaceous disease: Secondary | ICD-10-CM

## 2023-06-22 DIAGNOSIS — I10 Essential (primary) hypertension: Secondary | ICD-10-CM | POA: Diagnosis not present

## 2023-06-22 DIAGNOSIS — Z794 Long term (current) use of insulin: Secondary | ICD-10-CM

## 2023-06-22 DIAGNOSIS — I2510 Atherosclerotic heart disease of native coronary artery without angina pectoris: Secondary | ICD-10-CM

## 2023-06-22 DIAGNOSIS — M47812 Spondylosis without myelopathy or radiculopathy, cervical region: Secondary | ICD-10-CM | POA: Diagnosis not present

## 2023-06-22 DIAGNOSIS — G4733 Obstructive sleep apnea (adult) (pediatric): Secondary | ICD-10-CM

## 2023-06-22 DIAGNOSIS — E785 Hyperlipidemia, unspecified: Secondary | ICD-10-CM

## 2023-06-22 DIAGNOSIS — Z7189 Other specified counseling: Secondary | ICD-10-CM

## 2023-06-22 DIAGNOSIS — E1122 Type 2 diabetes mellitus with diabetic chronic kidney disease: Secondary | ICD-10-CM

## 2023-06-22 DIAGNOSIS — E1169 Type 2 diabetes mellitus with other specified complication: Secondary | ICD-10-CM

## 2023-06-22 DIAGNOSIS — N138 Other obstructive and reflux uropathy: Secondary | ICD-10-CM

## 2023-06-22 DIAGNOSIS — M50321 Other cervical disc degeneration at C4-C5 level: Secondary | ICD-10-CM | POA: Diagnosis not present

## 2023-06-22 DIAGNOSIS — E162 Hypoglycemia, unspecified: Secondary | ICD-10-CM

## 2023-06-22 DIAGNOSIS — Z86718 Personal history of other venous thrombosis and embolism: Secondary | ICD-10-CM

## 2023-06-22 LAB — HM DIABETES EYE EXAM

## 2023-06-22 MED ORDER — TAMSULOSIN HCL 0.4 MG PO CAPS: 0.4000 mg | ORAL_CAPSULE | Freq: Every day | ORAL | 4 refills | Status: AC

## 2023-06-22 MED ORDER — FINASTERIDE 5 MG PO TABS
5.0000 mg | ORAL_TABLET | Freq: Every day | ORAL | 4 refills | Status: AC
Start: 2023-06-22 — End: ?

## 2023-06-22 MED ORDER — PROBENECID 500 MG PO TABS: 250.0000 mg | ORAL_TABLET | ORAL | 4 refills | Status: AC

## 2023-06-22 NOTE — Assessment & Plan Note (Signed)
 Continue vit D 2000 units daily

## 2023-06-22 NOTE — Assessment & Plan Note (Addendum)
 Anticipate this is resolved after significant weight loss.  Continue low dose BB, ARB for cardiac health.

## 2023-06-22 NOTE — Assessment & Plan Note (Deleted)
Chronic, stable period off medication.  ?

## 2023-06-22 NOTE — Assessment & Plan Note (Signed)
 Concern for ongoing hypoglycemia - see above. Recommend he reach out to endocrinologist today.

## 2023-06-22 NOTE — Progress Notes (Signed)
 Ph: [phone] Fax: [phone]   Patient ID: Alan Morrison, male    DOB: 08/27/1950, 73 y.o.   MRN: [id number]  This visit was conducted in person.  BP 116/66   Pulse 83   Temp 97.9 F (36.6 C) (Oral)   Ht 5' 6.25" (1.683 m)   Wt 165 lb 4 oz (75 kg)   SpO2 94%   BMI 26.47 kg/m    CC: AMW/CPE Subjective:   HPI: Alan Morrison is a 73 y.o. male presenting on 06/22/2023 for Medicare Wellness   Did not see health advisor this year.   Hearing Screening   500Hz  1000Hz  2000Hz  4000Hz   Right ear 25 20 20  40  Left ear 20 20 20  40  Vision Screening - Comments:: Last eye exam, 05/2023.   Flowsheet Row Office Visit from 06/22/2023 in [facility] [place]  PHQ-2 Total Score 0          06/22/2023    9:29 AM 06/06/2023    7:57 PM 01/13/2023   12:06 PM 10/26/2022    9:25 AM 06/17/2022   10:56 AM  Fall Risk   Falls in the past year? 1 1 1  0 0  Number falls in past yr: 0 0 0    Injury with Fall? 1 1 1     Risk for fall due to :   History of fall(s)    Follow up   Falls evaluation completed     MI (inferior wall STEMI) 10/2019 s/p PCI with DES to RCA - treated with DAPT aspirin/plavix x1 yr total.    H/o L femoral DVT 04/2018 s/p xarelto treatment through 11/2019.  Rpt acute occlusive DVT of external iliac, common femoral, superficial femoral, deep femoral veins 01/2022 s/p perc mechanical venous thrombectomy of R popliteal, superficial femoral, common femoral, iliac vein and distal IVC with angioplasty of R iliac vein and distal IVC. He was also found to have acute PE, largely asymptomatic - started on xarelto indefinitely. Saw [facility] in follow up - found to have RLE venous reflux. Rec lifelong xarelto at 10mg  preventative dosing.   1.4 cm indeterminate left adrenal nodule-rec outpatient follow-up, repeat CT scan in 1 year. Saw endo with reassuring hormonal eval 02/2022 (DHEA low, cortisol, metanephrines, aldo/renin WNL). Stable on repeat imaging  DM - established  with [facility] clinic endocrinology (Dr Tedd Sias) - on metformin 1000mg  bid, basaglar 80u daily, trulicity --> Mounjaro 10mg  weekly. Weight loss has continued. Sugars running 90-115, has had some <70 (1-2x/wk). Concern for hypoglycemia: Syncope with fall 01/2023 - injured R hand - skier's thumb. Attributes to low sugar and dehydration after going a long period without eating or drinking.   New episodes of drooling for the past 6-12 months, progressively worsening. No memory trouble, no shaking/tremor, no anosmia, no gait disturbance.   OSA on CPAP diagnosed 2005 ([place]) - wife says mask doesn't fit well. Unsure pressure settings. Has not seen pulm in the past 20 yrs.   Notes ongoing neck pain worse with flexion associated with dizziness and vision changes. Never returned for flexion/extension images - will update today.   Preventative: COLONOSCOPY 07/2013 - 1 benign polyp rpt 10 yrs Arlyce Dice) COLONOSCOPY 09/2022 - localized inflammation to descending and transverse colon Leonides Schanz) - done in hospital for ischemic colitis.  Prostate - yearly PSA, stable period on flomax and finasteride.  Lung cancer screening - not eligible  AAA screen - no fmhx - not eligible  Flu shot yearly at work  COVID  vaccine Pfizer 2/201, 06/2019, booster 12/2019, 07/2020, bivalent 12/2020, 08/2021, 01/2022.  Pneumovax 2013, 2019, prevnar-13 2018, prevnar-20 01/2023.  Tdap 2016  RSV - 2023 Zostavax - 08/2013  Shingrix - 03/2017, 06/2017  Advanced planning discussion. Has at home, currently updating. Wife is HCPOA. Does not want prolonged life support if terminal condition. Asked to bring [place] a copy. Seat belt use discussed.  Sunscreen use discussed. No changing moles. Sees derm in April Ex smoker - quit remotely - 1972 - 4 PY hx  Alcohol - occasional beer  Dentist - Q68mo  Eye exam - yearly  Bowel - some constipation - manages with diet and water Bladder - no incontinence   Lives with wife  Occupation: equipment maintenance   Activity: [facility] [employer]  Diet: good water, daily fruits/vegetables, diet drinks      Relevant past medical, surgical, family and social history reviewed and updated as indicated. Interim medical history since our last visit reviewed. Allergies and medications reviewed and updated. Outpatient Medications Prior to Visit  Medication Sig Dispense Refill   acetaminophen (TYLENOL) 500 MG tablet Take 1,000 mg by mouth 2 (two) times daily as needed for headache (pain).     aspirin EC 81 MG tablet Take 81 mg by mouth daily with supper. Swallow whole.     Calcium Carbonate Antacid (TUMS PO) Take 1 tablet by mouth daily as needed (acid reflux/indigestion).     Cholecalciferol (VITAMIN D) 2000 units tablet Take 2,000 Units by mouth daily with breakfast.      Insulin Glargine (BASAGLAR KWIKPEN) 100 UNIT/ML Inject 80 Units into the skin at bedtime.     Insulin Pen Needle (B-D ULTRAFINE III SHORT PEN) 31G X 8 MM MISC 1 Device by Other route 4 (four) times daily. USE AS DIRECTED DAILY 120 each 11   Iron, Ferrous Sulfate, 325 (65 Fe) MG TABS Take 325 mg by mouth every other day.     losartan (COZAAR) 25 MG tablet Take 0.5 tablets (12.5 mg total) by mouth daily.     metFORMIN (GLUCOPHAGE) 1000 MG tablet Take 1 tablet (1,000 mg total) by mouth 2 (two) times daily with a meal.     metoprolol tartrate (LOPRESSOR) 25 MG tablet TAKE 1/2 TABLET TWICE A DAY BY MOUTH 90 tablet 3   Multiple Vitamin (MULTIVITAMIN WITH MINERALS) TABS tablet Take 1 tablet by mouth daily with breakfast.      nitroGLYCERIN (NITROSTAT) 0.4 MG SL tablet PLACE 1 TABLET UNDER THE TONGUE EVERY 5 MINUTES AS NEEDED FOR CHEST PAIN 25 tablet 11   ONETOUCH ULTRA test strip USE TO CHECK SUGARS DAILY  AS DIRECTED 100 strip 3   rivaroxaban (XARELTO) 10 MG TABS tablet TAKE 1 TABLET BY MOUTH EVERY DAY 30 tablet 2   rosuvastatin (CRESTOR) 40 MG tablet Take 1 tablet (40 mg total) by mouth daily with supper. 90 tablet 3   tirzepatide (MOUNJARO) 10  MG/0.5ML Pen Inject 0.5 mL (10 mg total) subcutaneously into the skin once a week. 6 mL 0   finasteride (PROSCAR) 5 MG tablet Take 1 tablet (5 mg total) by mouth daily. 90 tablet 4   probenecid (BENEMID) 500 MG tablet Take 0.5 tablets (250 mg total) by mouth every other day. 45 tablet 2   tamsulosin (FLOMAX) 0.4 MG CAPS capsule Take 1 capsule (0.4 mg total) by mouth daily. 90 capsule 4   No facility-administered medications prior to visit.     Per HPI unless specifically indicated in ROS section below Review  of Systems  Constitutional:  Negative for activity change, appetite change, chills, fatigue, fever and unexpected weight change.  HENT:  Negative for hearing loss.   Eyes:  Negative for visual disturbance.  Respiratory:  Negative for cough, chest tightness, shortness of breath and wheezing.   Cardiovascular:  Negative for chest pain, palpitations and leg swelling.  Gastrointestinal:  Negative for abdominal distention, abdominal pain, blood in stool, constipation, diarrhea, nausea and vomiting.  Genitourinary:  Negative for difficulty urinating and hematuria.  Musculoskeletal:  Negative for arthralgias, myalgias and neck pain.  Skin:  Negative for rash.  Neurological:  Positive for dizziness and syncope (x1). Negative for seizures and headaches.  Hematological:  Negative for adenopathy. Does not bruise/bleed easily.  Psychiatric/Behavioral:  Negative for dysphoric mood. The patient is not nervous/anxious.     Objective:  BP 116/66   Pulse 83   Temp 97.9 F (36.6 C) (Oral)   Ht 5' 6.25" (1.683 m)   Wt 165 lb 4 oz (75 kg)   SpO2 94%   BMI 26.47 kg/m   Wt Readings from Last 3 Encounters:  06/22/23 165 lb 4 oz (75 kg)  05/24/23 173 lb 9.6 oz (78.7 kg)  02/24/23 171 lb 6 oz (77.7 kg)      Physical Exam Vitals and nursing note reviewed.  Constitutional:      General: He is not in acute distress.    Appearance: Normal appearance. He is well-developed. He is not ill-appearing.   HENT:     Head: Normocephalic and atraumatic.     Right Ear: Hearing, tympanic membrane, ear canal and external ear normal.     Left Ear: Hearing, tympanic membrane, ear canal and external ear normal.     Mouth/Throat:     Mouth: Mucous membranes are moist.     Pharynx: Oropharynx is clear. No oropharyngeal exudate or posterior oropharyngeal erythema.  Eyes:     General: No scleral icterus.    Extraocular Movements: Extraocular movements intact.     Conjunctiva/sclera: Conjunctivae normal.     Pupils: Pupils are equal, round, and reactive to light.  Neck:     Thyroid: No thyroid mass or thyromegaly.  Cardiovascular:     Rate and Rhythm: Normal rate and regular rhythm.     Pulses: Normal pulses.          Radial pulses are 2+ on the right side and 2+ on the left side.     Heart sounds: Normal heart sounds. No murmur heard. Pulmonary:     Effort: Pulmonary effort is normal. No respiratory distress.     Breath sounds: Normal breath sounds. No wheezing, rhonchi or rales.  Abdominal:     General: Bowel sounds are normal. There is no distension.     Palpations: Abdomen is soft. There is no mass.     Tenderness: There is no abdominal tenderness. There is no guarding or rebound.     Hernia: No hernia is present.  Musculoskeletal:        General: Normal range of motion.     Cervical back: Normal range of motion and neck supple.     Right lower leg: No edema.     Left lower leg: No edema.  Lymphadenopathy:     Cervical: No cervical adenopathy.  Skin:    General: Skin is warm and dry.     Findings: No rash.  Neurological:     General: No focal deficit present.     Mental Status: He is alert  and oriented to person, place, and time.     Comments:  Recall 3/3 Calculation DLROW 5/5  Psychiatric:        Mood and Affect: Mood normal.        Behavior: Behavior normal.        Thought Content: Thought content normal.        Judgment: Judgment normal.       Results for orders placed or  performed in visit on 06/15/23  Vitamin B12   Collection Time: 06/15/23  7:30 AM  Result Value Ref Range   Vitamin B-12 297 211 - 911 pg/mL  CBC with Differential/Platelet   Collection Time: 06/15/23  7:30 AM  Result Value Ref Range   WBC 6.3 4.0 - 10.5 K/uL   RBC 4.63 4.22 - 5.81 Mil/uL   Hemoglobin 14.7 13.0 - 17.0 g/dL   HCT 16.1 09.6 - 04.5 %   MCV 94.6 78.0 - 100.0 fl   MCHC 33.6 30.0 - 36.0 g/dL   RDW 40.9 81.1 - 91.4 %   Platelets 194.0 150.0 - 400.0 K/uL   Neutrophils Relative % 62.6 43.0 - 77.0 %   Lymphocytes Relative 26.4 12.0 - 46.0 %   Monocytes Relative 7.2 3.0 - 12.0 %   Eosinophils Relative 2.9 0.0 - 5.0 %   Basophils Relative 0.9 0.0 - 3.0 %   Neutro Abs 3.9 1.4 - 7.7 K/uL   Lymphs Abs 1.7 0.7 - 4.0 K/uL   Monocytes Absolute 0.5 0.1 - 1.0 K/uL   Eosinophils Absolute 0.2 0.0 - 0.7 K/uL   Basophils Absolute 0.1 0.0 - 0.1 K/uL  Uric acid   Collection Time: 06/15/23  7:30 AM  Result Value Ref Range   Uric Acid, Serum 6.4 4.0 - 7.8 mg/dL  VITAMIN D 25 Hydroxy (Vit-D Deficiency, Fractures)   Collection Time: 06/15/23  7:30 AM  Result Value Ref Range   VITD 55.74 30.00 - 100.00 ng/mL  PSA   Collection Time: 06/15/23  7:30 AM  Result Value Ref Range   PSA 0.48 0.10 - 4.00 ng/mL  Microalbumin / creatinine urine ratio   Collection Time: 06/15/23  7:30 AM  Result Value Ref Range   Microalb, Ur 6.4 (H) 0.0 - 1.9 mg/dL   Creatinine,U 782.9 mg/dL   Microalb Creat Ratio 40.7 (H) 0.0 - 30.0 mg/g  Hemoglobin A1c   Collection Time: 06/15/23  7:30 AM  Result Value Ref Range   Hgb A1c MFr Bld 5.4 4.6 - 6.5 %  Comprehensive metabolic panel   Collection Time: 06/15/23  7:30 AM  Result Value Ref Range   Sodium 143 135 - 145 mEq/L   Potassium 4.9 3.5 - 5.1 mEq/L   Chloride 107 96 - 112 mEq/L   CO2 28 19 - 32 mEq/L   Glucose, Bld 82 70 - 99 mg/dL   BUN 40 (H) 6 - 23 mg/dL   Creatinine, Ser 5.62 0.40 - 1.50 mg/dL   Total Bilirubin 0.6 0.2 - 1.2 mg/dL   Alkaline  Phosphatase 46 39 - 117 U/L   AST 27 0 - 37 U/L   ALT 21 0 - 53 U/L   Total Protein 6.8 6.0 - 8.3 g/dL   Albumin 4.1 3.5 - 5.2 g/dL   GFR 13.08 (L) >65.78 mL/min   Calcium 10.0 8.4 - 10.5 mg/dL  Lipid panel   Collection Time: 06/15/23  7:30 AM  Result Value Ref Range   Cholesterol 99 0 - 200 mg/dL   Triglycerides 46.9 0.0 -  149.0 mg/dL   HDL 40.98 (L) >11.91 mg/dL   VLDL 47.8 0.0 - 29.5 mg/dL   LDL Cholesterol 50 0 - 99 mg/dL   Total CHOL/HDL Ratio 3    NonHDL 69.67    Lab Results  Component Value Date   IRON 69 06/10/2022   TIBC 411.6 06/10/2022   FERRITIN 30.1 06/10/2022     Assessment & Plan:   Problem List Items Addressed This Visit     Health maintenance examination (Chronic)   Preventative protocols reviewed and updated unless pt declined. Discussed healthy diet and lifestyle.       Advanced directives, counseling/discussion (Chronic)   Advanced planning discussion. Has at home, currently updating. Wife is HCPOA. Does not want prolonged life support if terminal condition. Asked to bring [place] a copy.      Medicare annual wellness visit, subsequent - Primary (Chronic)   I have personally reviewed the Medicare Annual Wellness questionnaire and have noted 1. The patient's medical and social history 2. Their use of alcohol, tobacco or illicit drugs 3. Their current medications and supplements 4. The patient's functional ability including ADL's, fall risks, home safety risks and hearing or visual impairment. Cognitive function has been assessed and addressed as indicated.  5. Diet and physical activity 6. Evidence for depression or mood disorders The patients weight, height, BMI have been recorded in the chart. I have made referrals, counseling and provided education to the patient based on review of the above and I have provided the pt with a written personalized care plan for preventive services. Provider list updated.. See scanned questionairre as needed for further  documentation. Reviewed preventative protocols and updated unless pt declined.       Type 2 diabetes mellitus with chronic kidney disease, with long-term current use of insulin (HCC)   Chronic, sees endo, appreciate their care. Great control, marked weight loss with Mounjaro. Concern for hypoglycemia - I asked him to reach out to Dr [facility] office to notify about this as may need insulin dose adjustment.       Coronary artery disease   Regularly sees cardiology.       Benign prostatic hyperplasia with urinary obstruction   Chronic, stable period on finasteride/ flomax      Relevant Medications   finasteride (PROSCAR) 5 MG tablet   tamsulosin (FLOMAX) 0.4 MG CAPS capsule   OSA on CPAP   Chronic on CPAP with OSA diagnosed 2005 previously followed by Dr Shelle Iron. Now with significant weight loss may need to have this reassessed - will refer back to [facility] pulm.       Relevant Orders   Ambulatory referral to Pulmonology   Essential hypertension, benign   Anticipate this is resolved after significant weight loss.  Continue low dose BB, ARB for cardiac health.       Body mass index (BMI) 25.0-25.9, adult   30 lb weight loss since 05/2022, continues Mounjaro 10mg  weekly.       Vitamin D deficiency   Continue vit D 2000 units daily      Hyperlipidemia associated with type 2 diabetes mellitus (HCC)   Chronic, stable on crestor- continue. The ASCVD Risk score (Arnett DK, et al., 2019) failed to calculate for the following reasons:   Risk score cannot be calculated because patient has a medical history suggesting prior/existing ASCVD       Hyperuricemia   H/o gout without recent flare. Continue low dose probenecid 250mg  every other day.  Relevant Medications   probenecid (BENEMID) 500 MG tablet   History of recurrent deep vein thrombosis (DVT)   Continue lifelong low dose xarelto 10mg  daily.       Hypoglycemia   Concern for ongoing hypoglycemia - see above. Recommend he  reach out to endocrinologist today.       DDD (degenerative disc disease), cervical   Noted on prior imaging.  Notes ongoing dizziness with neck flexion. Mild carotid disease on latest carotid US 10/2022.  Will proceed with flexion/extension cervical views, low threshold to check cervical MRI.       Relevant Medications   probenecid (BENEMID) 500 MG tablet     Meds ordered this encounter  Medications   finasteride (PROSCAR) 5 MG tablet    Sig: Take 1 tablet (5 mg total) by mouth daily.    Dispense:  90 tablet    Refill:  4   probenecid (BENEMID) 500 MG tablet    Sig: Take 0.5 tablets (250 mg total) by mouth every other day.    Dispense:  45 tablet    Refill:  4   tamsulosin (FLOMAX) 0.4 MG CAPS capsule    Sig: Take 1 capsule (0.4 mg total) by mouth daily.    Dispense:  90 capsule    Refill:  4    Orders Placed This Encounter  Procedures   Ambulatory referral to Pulmonology    Referral Priority:   Routine    Referral Type:   Consultation    Referral Reason:   Specialty Services Required    Requested Specialty:   Pulmonary Disease    Number of Visits Requested:   1    Patient Instructions  Call Dr Tedd Sias - to let he know about low sugars and passing out episode.  We will request latest eye exam from Dr Hyacinth Meeker.  We will refer you back to pulmonology for sleep apnea Good to see you today Return as needed or in 6 months for follow up visit   Follow up plan: Return in about 6 months (around 12/23/2023) for follow up visit.  Eustaquio Boyden, MD

## 2023-06-22 NOTE — Assessment & Plan Note (Signed)
Regularly sees cardiology 

## 2023-06-22 NOTE — Patient Instructions (Addendum)
 Call Dr Tedd Sias - to let he know about low sugars and passing out episode.  We will request latest eye exam from Dr Hyacinth Meeker.  We will refer you back to pulmonology for sleep apnea Good to see you today Return as needed or in 6 months for follow up visit

## 2023-06-22 NOTE — Assessment & Plan Note (Signed)
 Preventative protocols reviewed and updated unless pt declined. Discussed healthy diet and lifestyle.

## 2023-06-22 NOTE — Assessment & Plan Note (Signed)

## 2023-06-22 NOTE — Assessment & Plan Note (Signed)
 Chronic, sees endo, appreciate their care. Great control, marked weight loss with Mounjaro. Concern for hypoglycemia - I asked him to reach out to Dr [facility] office to notify about this as may need insulin dose adjustment.

## 2023-06-22 NOTE — Assessment & Plan Note (Signed)
 Noted on prior imaging.  Notes ongoing dizziness with neck flexion. Mild carotid disease on latest carotid US 10/2022.  Will proceed with flexion/extension cervical views, low threshold to check cervical MRI.

## 2023-06-22 NOTE — Assessment & Plan Note (Signed)
 Chronic on CPAP with OSA diagnosed 2005 previously followed by Dr Shelle Iron. Now with significant weight loss may need to have this reassessed - will refer back to [facility] pulm.

## 2023-06-22 NOTE — Assessment & Plan Note (Addendum)
 H/o gout without recent flare. Continue low dose probenecid 250mg  every other day.

## 2023-06-22 NOTE — Assessment & Plan Note (Signed)
 Chronic, stable period on finasteride/ flomax

## 2023-06-22 NOTE — Assessment & Plan Note (Signed)
 Continue lifelong low dose xarelto 10mg  daily.

## 2023-06-22 NOTE — Assessment & Plan Note (Signed)
 Chronic, stable on crestor- continue. The ASCVD Risk score (Arnett DK, et al., 2019) failed to calculate for the following reasons:   Risk score cannot be calculated because patient has a medical history suggesting prior/existing ASCVD

## 2023-06-22 NOTE — Assessment & Plan Note (Signed)
 30 lb weight loss since 05/2022, continues Mounjaro 10mg  weekly.

## 2023-06-22 NOTE — Assessment & Plan Note (Signed)
 Advanced planning discussion. Has at home, currently updating. Wife is HCPOA. Does not want prolonged life support if terminal condition. Asked to bring [place] a copy.

## 2023-06-24 DIAGNOSIS — E782 Mixed hyperlipidemia: Secondary | ICD-10-CM | POA: Diagnosis not present

## 2023-06-24 DIAGNOSIS — I10 Essential (primary) hypertension: Secondary | ICD-10-CM | POA: Diagnosis not present

## 2023-06-24 DIAGNOSIS — R809 Proteinuria, unspecified: Secondary | ICD-10-CM | POA: Diagnosis not present

## 2023-06-24 DIAGNOSIS — E1159 Type 2 diabetes mellitus with other circulatory complications: Secondary | ICD-10-CM | POA: Diagnosis not present

## 2023-06-24 DIAGNOSIS — Z794 Long term (current) use of insulin: Secondary | ICD-10-CM | POA: Diagnosis not present

## 2023-06-24 DIAGNOSIS — E1122 Type 2 diabetes mellitus with diabetic chronic kidney disease: Secondary | ICD-10-CM | POA: Diagnosis not present

## 2023-06-24 DIAGNOSIS — E1129 Type 2 diabetes mellitus with other diabetic kidney complication: Secondary | ICD-10-CM | POA: Diagnosis not present

## 2023-06-24 DIAGNOSIS — N1832 Chronic kidney disease, stage 3b: Secondary | ICD-10-CM | POA: Diagnosis not present

## 2023-07-09 ENCOUNTER — Encounter: Payer: Self-pay | Admitting: Family Medicine

## 2023-07-09 NOTE — Telephone Encounter (Signed)
 Called radiology they are changing to stat and will send report soon. Message sent to patient to let know what delay is.

## 2023-07-14 ENCOUNTER — Other Ambulatory Visit: Payer: Self-pay | Admitting: Cardiology

## 2023-07-15 ENCOUNTER — Encounter: Payer: Self-pay | Admitting: Family Medicine

## 2023-07-15 ENCOUNTER — Other Ambulatory Visit: Payer: Self-pay | Admitting: Family Medicine

## 2023-07-15 DIAGNOSIS — M5030 Other cervical disc degeneration, unspecified cervical region: Secondary | ICD-10-CM

## 2023-07-15 DIAGNOSIS — M47812 Spondylosis without myelopathy or radiculopathy, cervical region: Secondary | ICD-10-CM

## 2023-07-15 DIAGNOSIS — M4312 Spondylolisthesis, cervical region: Secondary | ICD-10-CM

## 2023-07-19 ENCOUNTER — Other Ambulatory Visit: Payer: Self-pay | Admitting: Family Medicine

## 2023-07-19 DIAGNOSIS — E790 Hyperuricemia without signs of inflammatory arthritis and tophaceous disease: Secondary | ICD-10-CM

## 2023-07-19 NOTE — Telephone Encounter (Signed)
 Too soon. Rx sent on 06/22/23, #45/4 refills to [facility]  Request denied.

## 2023-07-27 ENCOUNTER — Encounter: Payer: Self-pay | Admitting: Family Medicine

## 2023-07-27 DIAGNOSIS — M532X2 Spinal instabilities, cervical region: Secondary | ICD-10-CM

## 2023-08-03 NOTE — Telephone Encounter (Signed)
 Please fax appeal request to # indicated.

## 2023-08-04 ENCOUNTER — Telehealth: Payer: Self-pay | Admitting: Family Medicine

## 2023-08-04 NOTE — Telephone Encounter (Signed)
 Faxed appeal to [facility].

## 2023-08-04 NOTE — Telephone Encounter (Signed)
 See 08/04/23 phn note for appeal update.

## 2023-08-04 NOTE — Telephone Encounter (Signed)
 Noted.   Made pt aware of update via MyChart (see 07/27/23 pt msg).

## 2023-08-04 NOTE — Telephone Encounter (Signed)
 Copied from CRM [id number]. Topic: General - Other >> Aug 04, 2023  2:10 PM [place] E wrote: Reason for CRM: Bartholomew Light from the [employer] wanted to relay that they did receive the patient's appeal and it will take 72 hours or less for a decision. Bartholomew Light stated that a response should be in by April 19th or sooner. Appeal reference number is [id number].

## 2023-08-05 NOTE — Telephone Encounter (Signed)
 Patient may have to cancel his appt for now until we have approval. The [facility] will most likely reach out to the office or the patient if Auth Approval is not received by 4/22 or 4/23

## 2023-08-11 ENCOUNTER — Encounter: Payer: Self-pay | Admitting: Family Medicine

## 2023-08-11 ENCOUNTER — Encounter: Payer: Self-pay | Admitting: Cardiology

## 2023-08-11 DIAGNOSIS — Z85828 Personal history of other malignant neoplasm of skin: Secondary | ICD-10-CM | POA: Diagnosis not present

## 2023-08-11 DIAGNOSIS — D1801 Hemangioma of skin and subcutaneous tissue: Secondary | ICD-10-CM | POA: Diagnosis not present

## 2023-08-11 DIAGNOSIS — L821 Other seborrheic keratosis: Secondary | ICD-10-CM | POA: Diagnosis not present

## 2023-08-11 DIAGNOSIS — L570 Actinic keratosis: Secondary | ICD-10-CM | POA: Diagnosis not present

## 2023-08-12 ENCOUNTER — Ambulatory Visit
Admission: RE | Admit: 2023-08-12 | Discharge: 2023-08-12 | Disposition: A | Discharge status: Home / Self Care | Source: Ambulatory Visit | Attending: Family Medicine | Admitting: Family Medicine

## 2023-08-12 DIAGNOSIS — M47812 Spondylosis without myelopathy or radiculopathy, cervical region: Secondary | ICD-10-CM

## 2023-08-12 DIAGNOSIS — M5030 Other cervical disc degeneration, unspecified cervical region: Secondary | ICD-10-CM

## 2023-08-12 DIAGNOSIS — M4312 Spondylolisthesis, cervical region: Secondary | ICD-10-CM

## 2023-08-12 DIAGNOSIS — M542 Cervicalgia: Secondary | ICD-10-CM | POA: Diagnosis not present

## 2023-08-25 ENCOUNTER — Telehealth: Payer: Self-pay | Admitting: *Deleted

## 2023-08-25 NOTE — Telephone Encounter (Signed)
 Called and spoke with patient, he  unsure if his CPAP machine has an SD card or not, he is going to bringk his CPAP machine with him tomorrow to his appointment.  He no longer his his supplies from a DME company, he gets them online.  Nothing further needed.

## 2023-08-26 ENCOUNTER — Encounter: Payer: Self-pay | Admitting: Adult Health

## 2023-08-26 ENCOUNTER — Ambulatory Visit: Admitting: Adult Health

## 2023-08-26 ENCOUNTER — Encounter: Payer: Self-pay | Admitting: Family Medicine

## 2023-08-26 VITALS — BP 95/67 | HR 92 | Temp 97.9°F | Ht 67.5 in | Wt 164.8 lb

## 2023-08-26 DIAGNOSIS — Z87891 Personal history of nicotine dependence: Secondary | ICD-10-CM | POA: Diagnosis not present

## 2023-08-26 DIAGNOSIS — G4733 Obstructive sleep apnea (adult) (pediatric): Secondary | ICD-10-CM

## 2023-08-26 DIAGNOSIS — I10 Essential (primary) hypertension: Secondary | ICD-10-CM | POA: Diagnosis not present

## 2023-08-26 DIAGNOSIS — Z9981 Dependence on supplemental oxygen: Secondary | ICD-10-CM | POA: Diagnosis not present

## 2023-08-26 NOTE — Telephone Encounter (Signed)
 FYI, I sent a note to the patient.

## 2023-08-26 NOTE — Telephone Encounter (Signed)
 Called [facility] Reading Rm asking for update of 08/12/23 MRI cervical spine. States they are showing it hasn't been read but will escalate to STAT. Fyi to Dr Crissie Dome.

## 2023-08-26 NOTE — Assessment & Plan Note (Signed)
Continue follow-up with primary care 

## 2023-08-26 NOTE — Assessment & Plan Note (Addendum)
 Longstanding obstructive sleep apnea originally diagnosed in 2005.  Has been on CPAP since initial diagnosis.  His CPAP machine is 72 years old and is very noisy.  Unable to get CPAP download.  Will set patient up for home sleep study.  Once results are available we will order new CPAP machine. Continue on CPAP at bedtime.  Patient education given on sleep apnea.   - discussed how weight can impact sleep and risk for sleep disordered breathing - discussed options to assist with weight loss: combination of diet modification, cardiovascular and strength training exercises   - had an extensive discussion regarding the adverse health consequences related to untreated sleep disordered breathing - specifically discussed the risks for hypertension, coronary artery disease, cardiac dysrhythmias, cerebrovascular disease, and diabetes - lifestyle modification discussed   - discussed how sleep disruption can increase risk of accidents, particularly when driving - safe driving practices were discussed   Plan  Patient Instructions  Set up for home sleep study  Continue on CPAP at bedtime  Do not drive if sleepy.  Follow up in 6 weeks and As needed

## 2023-08-26 NOTE — Progress Notes (Signed)
 @Patient  ID: Alan Morrison, male    DOB: 11/17/1950, 73 y.o.   MRN: [id number]  Chief Complaint  Patient presents with   Follow-up    Referring provider: Claire Crick, MD  HPI: 73 yo male seen for sleep consult to establish for sleep apnea.  Medical history significant for PE, diabetes, coronary artery disease cardiomyopathy  TEST/EVENTS :   08/26/2023 Sleep consult  Patient presents for a sleep consult today to establish for sleep apnea.  Patient says he has had longstanding sleep apnea for greater than 20 years.  Was started on CPAP after initial diagnosis and has been on the same CPAP ever since.  His machine is gotten old and makes noises throughout the night.  Patient's spouse is no longer sleeping in the bedroom due to the loud noise.  Patient was diagnosed with severe sleep apnea in 2005.  Says he wears his CPAP every single night.  He feels that he sleeps with his CPAP very well and benefits from CPAP.  Unable to get CPAP download.  He goes to bed about 10 PM.  Gets up at 7 AM.  Weight is down 60 pounds.  Current weight is at 164 pounds with a BMI of 25.  Patient says his sleep is somewhat restless and feels that he gets up a few times throughout the night.  Does feel that he is tired upon awakening and throughout the day.  Epworth score is elevated at 16 out of 24.  Gets sleepy if he sits down to rest, watch TV or in the evening hours.  Gets his supplies through online.  Has no history of stroke.  Does not use any sleep aids.  No symptoms suspicious for cataplexy or sleep paralysis.  Social history patient is married.  He is retired.  Has adult children.  Does not smoke.  Social alcohol.  No drug use.  Past Surgical History:  Procedure Laterality Date   BIOPSY  10/10/2022   Procedure: BIOPSY;  Surgeon: Daina Drum, MD;  Location: [facility] ENDOSCOPY;  Service: Gastroenterology;;   CARDIAC CATHETERIZATION     COLONOSCOPY  07/2013   1 benign polyp rpt 10 yrs [name])   COLONOSCOPY  WITH PROPOFOL  N/A 10/10/2022   Procedure: COLONOSCOPY WITH PROPOFOL ;  Surgeon: Daina Drum, MD;  Location: [facility] ENDOSCOPY;  Service: Gastroenterology;  Laterality: N/A;   CORONARY THROMBECTOMY N/A 11/12/2019   Procedure: Coronary Thrombectomy;  Surgeon: Arty Binning, MD;  Location: [facility] INVASIVE CV LAB;  Service: Cardiovascular;  Laterality: N/A;   CORONARY ULTRASOUND/IVUS N/A 01/30/2022   Procedure: Intravascular Ultrasound/IVUS;  Surgeon: Young Hensen, MD;  Location: [facility] INVASIVE CV LAB;  Service: Cardiovascular;  Laterality: N/A;  Venous   CORONARY/GRAFT ACUTE MI REVASCULARIZATION N/A 11/12/2019   Procedure: Coronary/Graft Acute MI Revascularization;  Surgeon: Arty Binning, MD;  Location: [facility] INVASIVE CV LAB;  Service: Cardiovascular;  Laterality: N/A;   KNEE ARTHROSCOPY Left 1988   KNEE SURGERY Right 05/21/03   Right, Dr. Rozelle Corning, med meniscus tear via MRI   LEFT HEART CATH AND CORONARY ANGIOGRAPHY N/A 04/01/2017   Procedure: LEFT HEART CATH AND CORONARY ANGIOGRAPHY;  Surgeon: Swaziland, Peter M, MD;  Location: [facility] INVASIVE CV LAB;  Service: Cardiovascular;  Laterality: N/A;   LEFT HEART CATH AND CORONARY ANGIOGRAPHY N/A 11/12/2019   Procedure: LEFT HEART CATH AND CORONARY ANGIOGRAPHY;  Surgeon: Arty Binning, MD;  Location: [facility] INVASIVE CV LAB;  Service: Cardiovascular;  Laterality: N/A;   MYELOGRAM  08/05   bulging  disc   PERIPHERAL VASCULAR BALLOON ANGIOPLASTY  01/30/2022   Procedure: PERIPHERAL VASCULAR BALLOON ANGIOPLASTY;  Surgeon: Young Hensen, MD;  Location: [facility] INVASIVE CV LAB;  Service: Cardiovascular;;   PERIPHERAL VASCULAR THROMBECTOMY Right 01/30/2022   Procedure: PERIPHERAL VASCULAR THROMBECTOMY;  Surgeon: Young Hensen, MD;  Location: [facility] INVASIVE CV LAB;  Service: Cardiovascular;  Laterality: Right;      No Known Allergies  Immunization History  Administered Date(s) Administered   Fluad Quad(high Dose 65+) 01/12/2022   Influenza Whole 01/22/2009    Influenza, High Dose Seasonal PF 12/27/2017, 12/20/2018, 01/09/2020, 01/15/2021, 01/06/2023   Influenza, Seasonal, Injecte, Preservative Fre 01/18/2014   Influenza,inj,Quad PF,6+ Mos 01/20/2015   Influenza-Unspecified 01/18/2013, 01/18/2014, 01/30/2016, 01/06/2017   PFIZER Comirnaty(Gray Top)Covid-19 Tri-Sucrose Vaccine 07/31/2020, 06/26/2022   PFIZER(Purple Top)SARS-COV-2 Vaccination 05/28/2019, 06/22/2019, 01/09/2020   PNEUMOCOCCAL CONJUGATE-20 02/03/2023   Pfizer Covid-19 Vaccine Bivalent Booster 54yrs & up 01/15/2021, 08/29/2021   Pfizer(Comirnaty)Fall Seasonal Vaccine 12 years and older 01/24/2022, 01/06/2023   Pneumococcal Conjugate-13 10/22/2016   Pneumococcal Polysaccharide-23 05/06/2011, 10/18/2017   Respiratory Syncytial Virus Vaccine,Recomb Aduvanted(Arexvy) 01/12/2022   Td 08/21/2004   Tdap 05/29/2014   Zoster Recombinant(Shingrix) 04/14/2017, 06/18/2017   Zoster, Live 08/23/2013    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   by US    Bilateral pulmonary embolism (HCC) 02/05/2022   Cervical spondylosis without myelopathy    Choroidal nevus 01/22/2011   left, yearly eye exam, no diabetic retinopathy   COVID-19 virus infection 12/03/2022   Diabetes mellitus type II    DVT of deep femoral vein, left (HCC) 04/29/2018   LLE DVT from proximal femoral vein to popliteal vein into proximal calf (04/29/2018) started on xarelto  Heme (Magrinat) recommended continuing lifelong lower dose anticoagulant indefinitely (11/2018)   Dyspepsia    Ex-smoker    Fatty liver 02/2016   by US    GERD (gastroesophageal reflux disease)    Headache(784.0)    Heart murmur    Hx of   HLD (hyperlipidemia)    Hyperuricemia    Obesity    Sleep apnea    Stress-induced cardiomyopathy  09/27/1998   WNL, EF 64%   Urosepsis 01/01-01/05/10   Hospitalization    Tobacco History: Social History   Tobacco Use  Smoking Status Former   Current packs/day: 0.00   Average packs/day: 0.5 packs/day for 2.0 years (1.0 ttl pk-yrs)   Types: Cigarettes, Pipe, Cigars   Quit date: 04/20/1974   Years since quitting: 49.3   Passive exposure: Never  Smokeless Tobacco Never  Tobacco Comments   quit over 20 years   Counseling given: Not Answered Tobacco comments: quit over 20 years   Outpatient Medications Prior to Visit  Medication Sig Dispense Refill   acetaminophen  (TYLENOL ) 500 MG tablet Take 1,000 mg by mouth 2 (two) times daily as needed for headache (pain).     aspirin  EC 81 MG tablet Take 81 mg by mouth daily with supper. Swallow whole.     Calcium  Carbonate Antacid (TUMS PO) Take 1 tablet by mouth daily as needed (acid reflux/indigestion).     Cholecalciferol  (VITAMIN D ) 2000 units tablet Take 2,000 Units by mouth daily with breakfast.      finasteride  (PROSCAR ) 5 MG tablet Take 1 tablet (5 mg total) by mouth daily. 90  tablet 4   Insulin  Glargine (BASAGLAR  KWIKPEN) 100 UNIT/ML Inject 80 Units into the skin at bedtime.     Insulin  Pen Needle (B-D ULTRAFINE III SHORT PEN) 31G X 8 MM MISC 1 Device by Other route 4 (four) times daily. USE AS DIRECTED DAILY 120 each 11   Iron , Ferrous Sulfate , 325 (65 Fe) MG TABS Take 325 mg by mouth every other day.     losartan  (COZAAR ) 25 MG tablet TAKE 1 TABLET (25 MG TOTAL) BY MOUTH DAILY. 90 tablet 3   metFORMIN  (GLUCOPHAGE ) 1000 MG tablet Take 1 tablet (1,000 mg total) by mouth 2 (two) times daily with a meal.     metoprolol  tartrate (LOPRESSOR ) 25 MG tablet TAKE 1/2 TABLET TWICE A DAY BY MOUTH 90 tablet 3   Multiple Vitamin (MULTIVITAMIN WITH MINERALS) TABS tablet Take 1 tablet by mouth daily with breakfast.      nitroGLYCERIN  (NITROSTAT ) 0.4 MG SL tablet PLACE 1 TABLET UNDER THE TONGUE EVERY 5 MINUTES AS NEEDED FOR CHEST PAIN 25 tablet 11    ONETOUCH ULTRA test strip USE TO CHECK SUGARS DAILY  AS DIRECTED 100 strip 3   probenecid  (BENEMID ) 500 MG tablet Take 0.5 tablets (250 mg total) by mouth every other day. 45 tablet 4   rivaroxaban  (XARELTO ) 10 MG TABS tablet TAKE 1 TABLET BY MOUTH EVERY DAY 30 tablet 2   rosuvastatin  (CRESTOR ) 40 MG tablet Take 1 tablet (40 mg total) by mouth daily with supper. 90 tablet 3   tamsulosin  (FLOMAX ) 0.4 MG CAPS capsule Take 1 capsule (0.4 mg total) by mouth daily. 90 capsule 4   tirzepatide  (MOUNJARO ) 10 MG/0.5ML Pen Inject 0.5 mL (10 mg total) subcutaneously into the skin once a week. 6 mL 0   No facility-administered medications prior to visit.     Review of Systems:   Constitutional:   No  weight loss, night sweats,  Fevers, chills,+ fatigue, or  lassitude.  HEENT:   No headaches,  Difficulty swallowing,  Tooth/dental problems, or  Sore throat,                No sneezing, itching, ear ache, nasal congestion, post nasal drip,   CV:  No chest pain,  Orthopnea, PND, swelling in lower extremities, anasarca, dizziness, palpitations, syncope.   GI  No heartburn, indigestion, abdominal pain, nausea, vomiting, diarrhea, change in bowel habits, loss of appetite, bloody stools.   Resp: No shortness of breath with exertion or at rest.  No excess mucus, no productive cough,  No non-productive cough,  No coughing up of blood.  No change in color of mucus.  No wheezing.  No chest wall deformity  Skin: no rash or lesions.  GU: no dysuria, change in color of urine, no urgency or frequency.  No flank pain, no hematuria   MS:  No joint pain or swelling.  No decreased range of motion.  No back pain.    Physical Exam  BP 95/67 (BP Location: Left Arm, Patient Position: Sitting, Cuff Size: Large)   Pulse 92   Temp 97.9 F (36.6 C) (Temporal)   Ht 5' 7.5" (1.715 m)   Wt 164 lb 12.8 oz (74.8 kg)   SpO2 96%   BMI 25.43 kg/m   GEN: A/Ox3; pleasant , NAD, well nourished    HEENT:  [place]/AT,   NOSE-clear, THROAT-clear, no lesions, no postnasal drip or exudate noted.  Class III MP airway  NECK:  Supple w/ fair ROM; no JVD; normal carotid impulses w/o  bruits; no thyromegaly or nodules palpated; no lymphadenopathy.    RESP  Clear  P & A; w/o, wheezes/ rales/ or rhonchi. no accessory muscle use, no dullness to percussion  CARD:  RRR, no m/r/g, no peripheral edema, pulses intact, no cyanosis or clubbing.  GI:   Soft & nt; nml bowel sounds; no organomegaly or masses detected.   Musco: Warm bil, no deformities or joint swelling noted.   Neuro: alert, no focal deficits noted.    Skin: Warm, no lesions or rashes    Lab Results:  CBC     ProBNP No results found for: "PROBNP"  Imaging:   Administration History     None           No data to display          No results found for: "NITRICOXIDE"      Assessment & Plan:   OSA on CPAP Longstanding obstructive sleep apnea originally diagnosed in 2005.  Has been on CPAP since initial diagnosis.  His CPAP machine is 72 years old and is very noisy.  Unable to get CPAP download.  Will set patient up for home sleep study.  Once results are available we will order new CPAP machine. Patient education given on sleep apnea.   - discussed how weight can impact sleep and risk for sleep disordered breathing - discussed options to assist with weight loss: combination of diet modification, cardiovascular and strength training exercises   - had an extensive discussion regarding the adverse health consequences related to untreated sleep disordered breathing - specifically discussed the risks for hypertension, coronary artery disease, cardiac dysrhythmias, cerebrovascular disease, and diabetes - lifestyle modification discussed   - discussed how sleep disruption can increase risk of accidents, particularly when driving - safe driving practices were discussed   Plan  Patient Instructions  Set up for home sleep study   Continue on CPAP at bedtime  Do not drive if sleepy.  Follow up in 6 weeks and As needed      Essential hypertension, benign Continue follow-up with primary care     Roena Clark, NP 08/26/2023

## 2023-08-26 NOTE — Patient Instructions (Signed)
 Set up for home sleep study  Continue on CPAP at bedtime  Do not drive if sleepy.  Follow up in 6 weeks and As needed

## 2023-08-27 ENCOUNTER — Encounter: Payer: Self-pay | Admitting: Family Medicine

## 2023-08-27 NOTE — Telephone Encounter (Signed)
 Noted.

## 2023-08-31 NOTE — Telephone Encounter (Signed)
 Dr Crissie Dome, I don't see a referral to a spine clinic. Am I over looking it?

## 2023-09-06 NOTE — Progress Notes (Addendum)
 Referring Physician:  Claire Crick, MD [address] [place],  [place] [address]  Primary Physician:  Claire Crick, MD  History of Present Illness: 09/07/2023 Alan Morrison has a history of CAD, HTN, cardiomyopathy, history of recurrent DVTs, OSA, GERD, fatty liver, ischemic colitis, DM, hyperlipidemia, vertigo, BPH.   He has intermittent neck pain only when he looks down. Has some pain when he turns his head as well. Pain feels tight and stiff  for longer than a year. No arm pain. No numbness, tingling, or weakness. Improvement with not looking down and stretching.   He is taking XARELTO . He takes prn tylenol .   He does not smoke.   Conservative measures:  Physical therapy: none Multimodal medical therapy including regular antiinflammatories: tylenol  Injections: none  Past Surgery: no spinal surgeries  Alan Morrison has no symptoms of cervical myelopathy.  The symptoms are causing a significant impact on the patient's life.   Review of Systems:  A 10 point review of systems is negative, except for the pertinent positives and negatives detailed in the HPI.  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   left, yearly eye exam, no diabetic retinopathy   COVID-19 virus infection 12/03/2022   Diabetes mellitus type II    DVT of deep femoral vein, left (HCC) 04/29/2018   LLE DVT from proximal femoral vein to popliteal vein into proximal calf (04/29/2018) started on xarelto  Heme (Magrinat) recommended continuing lifelong lower dose anticoagulant indefinitely (11/2018)   Dyspepsia    Ex-smoker     Fatty liver 02/2016   by US    GERD (gastroesophageal reflux disease)    Headache(784.0)    Heart murmur    Hx of   HLD (hyperlipidemia)    Hyperuricemia    Obesity    Sleep apnea    Stress-induced cardiomyopathy 09/27/1998   WNL, EF 64%   Urosepsis 01/01-01/05/10   Hospitalization    Past Surgical History: Past Surgical History:  Procedure Laterality Date   BIOPSY  10/10/2022   Procedure: BIOPSY;  Surgeon: Daina Drum, MD;  Location: [facility] ENDOSCOPY;  Service: Gastroenterology;;   CARDIAC CATHETERIZATION     COLONOSCOPY  07/2013   1 benign polyp rpt 10 yrs [name])   COLONOSCOPY WITH PROPOFOL  N/A 10/10/2022   Procedure: COLONOSCOPY WITH PROPOFOL ;  Surgeon: Daina Drum, MD;  Location: [facility] ENDOSCOPY;  Service: Gastroenterology;  Laterality: N/A;   CORONARY THROMBECTOMY N/A 11/12/2019   Procedure: Coronary Thrombectomy;  Surgeon: Arty Binning, MD;  Location: [facility] INVASIVE CV LAB;  Service: Cardiovascular;  Laterality: N/A;   CORONARY ULTRASOUND/IVUS N/A 01/30/2022   Procedure: Intravascular Ultrasound/IVUS;  Surgeon: Young Hensen, MD;  Location: [facility] INVASIVE CV LAB;  Service: Cardiovascular;  Laterality: N/A;  Venous   CORONARY/GRAFT ACUTE MI REVASCULARIZATION N/A 11/12/2019   Procedure: Coronary/Graft Acute MI Revascularization;  Surgeon: Arty Binning, MD;  Location: [facility] INVASIVE CV LAB;  Service: Cardiovascular;  Laterality: N/A;   KNEE ARTHROSCOPY Left 1988   KNEE SURGERY Right 05/21/03   Right, Dr. Rozelle Corning, med meniscus tear via MRI   LEFT HEART CATH AND CORONARY ANGIOGRAPHY  N/A 04/01/2017   Procedure: LEFT HEART CATH AND CORONARY ANGIOGRAPHY;  Surgeon: Swaziland, Peter M, MD;  Location[facility] INVASIVE CV LAB;  Service: Cardiovascular;  Laterality: N/A;   LEFT HEART CATH AND CORONARY ANGIOGRAPHY N/A 11/12/2019   Procedure: LEFT HEART CATH AND CORONARY ANGIOGRAPHY;  Surgeon: Arty Binning, MD;  Location: [facility] INVASIVE CV LAB;  Service: Cardiovascular;  Laterality: N/A;   MYELOGRAM   08/05   bulging disc   PERIPHERAL VASCULAR BALLOON ANGIOPLASTY  01/30/2022   Procedure: PERIPHERAL VASCULAR BALLOON ANGIOPLASTY;  Surgeon: Young Hensen, MD;  Location: [facility] INVASIVE CV LAB;  Service: Cardiovascular;;   PERIPHERAL VASCULAR THROMBECTOMY Right 01/30/2022   Procedure: PERIPHERAL VASCULAR THROMBECTOMY;  Surgeon: Young Hensen, MD;  Location: [facility] INVASIVE CV LAB;  Service: Cardiovascular;  Laterality: Right;    Allergies: Allergies as of 09/07/2023   (No Known Allergies)    Medications: Outpatient Encounter Medications as of 09/07/2023  Medication Sig   acetaminophen  (TYLENOL ) 500 MG tablet Take 1,000 mg by mouth 2 (two) times daily as needed for headache (pain).   aspirin  EC 81 MG tablet Take 81 mg by mouth daily with supper. Swallow whole.   Calcium  Carbonate Antacid (TUMS PO) Take 1 tablet by mouth daily as needed (acid reflux/indigestion).   Cholecalciferol  (VITAMIN D ) 2000 units tablet Take 2,000 Units by mouth daily with breakfast.    finasteride  (PROSCAR ) 5 MG tablet Take 1 tablet (5 mg total) by mouth daily.   Insulin  Glargine (BASAGLAR  KWIKPEN) 100 UNIT/ML Inject 60 Units into the skin at bedtime.   Insulin  Pen Needle (B-D ULTRAFINE III SHORT PEN) 31G X 8 MM MISC 1 Device by Other route 4 (four) times daily. USE AS DIRECTED DAILY   Iron , Ferrous Sulfate , 325 (65 Fe) MG TABS Take 325 mg by mouth every other day.   losartan  (COZAAR ) 25 MG tablet TAKE 1 TABLET (25 MG TOTAL) BY MOUTH DAILY.   metFORMIN  (GLUCOPHAGE ) 1000 MG tablet Take 1 tablet (1,000 mg total) by mouth 2 (two) times daily with a meal.   metoprolol  tartrate (LOPRESSOR ) 25 MG tablet TAKE 1/2 TABLET TWICE A DAY BY MOUTH   Multiple Vitamin (MULTIVITAMIN WITH MINERALS) TABS tablet Take 1 tablet by mouth daily with breakfast.    ONETOUCH ULTRA test strip USE TO CHECK SUGARS DAILY  AS DIRECTED   probenecid  (BENEMID ) 500 MG tablet Take 0.5 tablets (250 mg total) by mouth every other day.   rivaroxaban   (XARELTO ) 10 MG TABS tablet TAKE 1 TABLET BY MOUTH EVERY DAY   rosuvastatin  (CRESTOR ) 40 MG tablet Take 1 tablet (40 mg total) by mouth daily with supper.   tamsulosin  (FLOMAX ) 0.4 MG CAPS capsule Take 1 capsule (0.4 mg total) by mouth daily.   nitroGLYCERIN  (NITROSTAT ) 0.4 MG SL tablet PLACE 1 TABLET UNDER THE TONGUE EVERY 5 MINUTES AS NEEDED FOR CHEST PAIN (Patient not taking: Reported on 09/07/2023)   No facility-administered encounter medications on file as of 09/07/2023.    Social History: Social History   Tobacco Use   Smoking status: Former    Current packs/day: 0.00    Average packs/day: 0.5 packs/day for 2.0 years (1.0 ttl pk-yrs)    Types: Cigarettes, Pipe, Cigars    Quit date: 04/20/1974    Years since quitting: 49.4    Passive exposure: Never   Smokeless tobacco: Never   Tobacco comments:    quit over 20 years  Vaping Use   Vaping status: Never Used  Substance Use Topics   Alcohol  use: Yes    Alcohol/week: 2.0 standard drinks of alcohol    Types: 1 Cans of beer, 1 Shots of liquor per week    Comment: Ocassional   Drug use: Never    Family Medical History: Family History  Problem Relation Age of Onset   Heart disease Mother        CHF   Diabetes Mother    Heart disease Father        CHF   Hypertension Father    Pulmonary fibrosis Sister    Sjogren's syndrome Sister    Migraines Brother        severe headaches from arsenic in the past from  wood that was treated on his deck   CAD Brother 54       stent   Cancer Maternal Uncle        unsure   Stroke Neg Hx    Colon cancer Neg Hx     Physical Examination: Vitals:   09/07/23 1307  BP: 118/70    General: Patient is well developed, well nourished, calm, collected, and in no apparent distress. Attention to examination is appropriate.  Respiratory: Patient is breathing without any difficulty.   NEUROLOGICAL:     Awake, alert, oriented to person, place, and time.  Speech is clear and fluent. Fund of  knowledge is appropriate.   Cranial Nerves: Pupils equal round and reactive to light.  Facial tone is symmetric.    No posterior cervical tenderness. No tenderness in bilateral trapezial region.   Good ROM of both shoulders with no pain.   No abnormal lesions on exposed skin.   Strength: Side Biceps Triceps Deltoid Interossei Grip Wrist Ext. Wrist Flex.  R 5 5 5 5 5 5 5   L 5 5 5 5 5 5 5    Side Iliopsoas Quads Hamstring PF DF EHL  R 5 5 5 5 5 5   L 5 5 5 5 5 5    Reflexes are 2+ and symmetric at the biceps, brachioradialis, patella and achilles.   Hoffman's is absent.  Clonus is not present.   Bilateral upper and lower extremity sensation is intact to light touch.     Gait is normal.     Medical Decision Making  Imaging: Cervical xrays dated 06/22/23:  FINDINGS: Lateral radiographs of the cervical spine were obtained in neutral, flexion, and extension positioning. Grade 1 anterolisthesis of C7 on T1 without dynamic listhesis. There is grade 1 anterolisthesis of C3 on C4 in flexion which reduces upon extension. Multilevel degenerative disc disease with disc height loss and endplate spurring most pronounced at the C4-5 through C6-7 levels. Degenerative facet arthropathy not well assessed in the absence of oblique views. No fracture identified. No prevertebral soft tissue swelling.   IMPRESSION: 1. Grade 1 anterolisthesis of C3 on C4 in flexion which reduces upon extension. 2. Grade 1 anterolisthesis of C7 on T1 without dynamic listhesis. 3. Multilevel degenerative disc disease most pronounced at the C4-5 through C6-7 levels.     Electronically Signed   By: Leverne Reading D.O.   On: 07/09/2023 13:33   Cervical MRI dated 08/12/23:  FINDINGS: Alignment: Similar straightening of cervical lordosis. Mild degenerative anterolisthesis C7 on T1. No significant cervical spondylolisthesis or scoliosis.   Vertebrae: Normal background bone marrow signal. Chronic degenerative  endplate marrow signal changes C4-C5 and C5-C6. Faint degenerative superimposed marrow edema at the anterior C5-C6 endplates (series 161, image 10). C5 benign vertebral hemangioma (normal variant). No other marrow edema.  Cord: Capacious cervical spinal canal at most levels, detailed below. Spinal cord signal and morphology remain within normal limits. Negative visible upper thoracic canal.   Posterior Fossa, vertebral arteries, paraspinal tissues: Cervicomedullary junction is within normal limits. Negative visible posterior fossa. Preserved major vascular flow voids in the bilateral neck, dominant left and diminutive right vertebral arteries (normal variant). Negative visible neck soft tissues, lung apices.   Disc levels:   C2-C3:  Negative.   C3-C4: Disc space loss. Mild disc bulging and endplate spurring asymmetric to the left (series 106, image 15). Narrows ventral CSF space there but no spinal stenosis. Borderline to mild left C4 foraminal stenosis.   C4-C5: Moderate to severe disc space loss. Circumferential disc osteophyte complex with narrowing ventral CSF space but no spinal stenosis. Mild facet hypertrophy. Mild left and mild-to-moderate right C5 foraminal stenosis.   C5-C6: Similar disc space loss. Circumferential disc osteophyte complex. Neuro ventral CSF space with no spinal stenosis. Moderate to severe bilateral C6 foraminal stenosis. Mild facet and ligament flavum hypertrophy.   C6-C7: Slightly less disc space loss. Mild circumferential disc osteophyte complex, broad-based posterior component of disc. No spinal stenosis. Mild left and moderate right C7 foraminal stenosis.   C7-T1: Mild grade 1 anterolisthesis. Mild to moderate facet and ligament flavum hypertrophy. No spinal stenosis. Mild bilateral C8 foraminal stenosis.   IMPRESSION: 1. Chronic cervical spine degeneration most pronounced at C4-C5 and C5-C6. Faint degenerative endplate marrow edema at the  latter. Mild degenerative spondylolisthesis at C7-T1. 2. No cervical spinal stenosis. Moderate to severe bilateral C6 neural foraminal stenosis, up to moderate at the right C5 and C7 nerve levels.     Electronically Signed   By: Marlise Simpers M.D.   On: 08/26/2023 13:06  I have personally reviewed the images and agree with the above interpretation. I don't appreciate a significant slip at C3-C4.   Assessment and Plan: Alan Morrison has over a year history of intermittent neck pain only when he looks down. Has some pain when he turns his head as well. No arm pain. No numbness, tingling, or weakness.   He has known cervical spondylosis with DDD C4-C7. He has multilevel foraminal stenosis and slight slip C7-T1. I don't appreciate a significant slip at C3-C4  Pain likely due to underlying cervical DDD/spondylosis.   Treatment options discussed with patient and following plan made:   - Referral to pain management (Lateef) to discuss possible cervical injections.  - PT recommended for cervical spine. He declines for now. May revisit after injection.  - Not sure why he would be getting blurry vision when he looks down. Has discussed with PCP and eye doctor. Will review with Dr. Felipe Horton and send him a message.  - Follow up with me in 8 weeks and prn.   I spent a total of 35 minutes in face-to-face and non-face-to-face activities related to this patient's care today including review of outside records, review of imaging, review of symptoms, physical exam, discussion of differential diagnosis, discussion of treatment options, and documentation.   ADDENDUM 09/08/23:  Discussed with intermittent blurry vision when he looks down with Dr. Felipe Horton. This is not cervical mediated, but could be vascular. He recommends CTA of head and neck. Called and spoke with patient and he would like to schedule this. Orders in.   Of note, he has seen [facility] in [place] in the past Alan Morrison).   Will set up  phone visit to discuss results once I have them back.  Thank you for involving me in the care of this patient.   Lucetta Russel PA-C Dept. of Neurosurgery

## 2023-09-07 ENCOUNTER — Encounter

## 2023-09-07 ENCOUNTER — Ambulatory Visit: Admitting: Orthopedic Surgery

## 2023-09-07 ENCOUNTER — Encounter: Payer: Self-pay | Admitting: Orthopedic Surgery

## 2023-09-07 VITALS — BP 118/70 | Ht 67.5 in | Wt 167.5 lb

## 2023-09-07 DIAGNOSIS — M50321 Other cervical disc degeneration at C4-C5 level: Secondary | ICD-10-CM | POA: Diagnosis not present

## 2023-09-07 DIAGNOSIS — M47812 Spondylosis without myelopathy or radiculopathy, cervical region: Secondary | ICD-10-CM

## 2023-09-07 DIAGNOSIS — M50322 Other cervical disc degeneration at C5-C6 level: Secondary | ICD-10-CM

## 2023-09-07 DIAGNOSIS — M5030 Other cervical disc degeneration, unspecified cervical region: Secondary | ICD-10-CM

## 2023-09-07 DIAGNOSIS — M4802 Spinal stenosis, cervical region: Secondary | ICD-10-CM | POA: Diagnosis not present

## 2023-09-07 DIAGNOSIS — H538 Other visual disturbances: Secondary | ICD-10-CM

## 2023-09-07 DIAGNOSIS — G4730 Sleep apnea, unspecified: Secondary | ICD-10-CM | POA: Diagnosis not present

## 2023-09-07 DIAGNOSIS — M50323 Other cervical disc degeneration at C6-C7 level: Secondary | ICD-10-CM | POA: Diagnosis not present

## 2023-09-07 DIAGNOSIS — G4733 Obstructive sleep apnea (adult) (pediatric): Secondary | ICD-10-CM

## 2023-09-07 NOTE — Patient Instructions (Signed)
 It was so nice to see you today. Thank you so much for coming in.    You have wear and tear in your neck and this is likely causing your pain.   Not sure why you are getting blurry vision when you look down. Will review with Dr. Felipe Horton and send you a message.   I sent physical therapy orders to [facility] in [place]. You can call them at the number below if you don't hear from them to schedule your visit.   I want you to see pain management here in [place] (Dr. Rhesa Celeste) to discuss possible cervical injections. They should call you to schedule an appointment or you can call them at [phone].   I will see you back in 6-8 weeks. Please do not hesitate to call if you have any questions or concerns. You can also message me in MyChart.   Alan Russel PA-C [phone]     The physicians and staff at [facility] at [facility] are committed to providing excellent care. You may receive a survey asking for feedback about your experience at our office. We value you your feedback and appreciate you taking the time to to fill it out. The [facility] leadership team is also available to discuss your experience in person, feel free to contact us  [phone].

## 2023-09-08 NOTE — Addendum Note (Signed)
 Addended byLucetta Russel on: 09/08/2023 04:01 PM   Modules accepted: Orders

## 2023-09-20 ENCOUNTER — Ambulatory Visit
Admission: RE | Admit: 2023-09-20 | Discharge: 2023-09-20 | Disposition: A | Discharge status: Home / Self Care | Source: Ambulatory Visit | Attending: Orthopedic Surgery | Admitting: Orthopedic Surgery

## 2023-09-20 DIAGNOSIS — H538 Other visual disturbances: Secondary | ICD-10-CM

## 2023-09-20 DIAGNOSIS — M5030 Other cervical disc degeneration, unspecified cervical region: Secondary | ICD-10-CM

## 2023-09-20 DIAGNOSIS — M47812 Spondylosis without myelopathy or radiculopathy, cervical region: Secondary | ICD-10-CM

## 2023-09-20 MED ORDER — IOPAMIDOL (ISOVUE-370) INJECTION 76%
75.0000 mL | Freq: Once | INTRAVENOUS | Status: AC | PRN
  Administered 2023-09-20: 75 mL via INTRAVENOUS

## 2023-09-25 DIAGNOSIS — G4733 Obstructive sleep apnea (adult) (pediatric): Secondary | ICD-10-CM | POA: Diagnosis not present

## 2023-09-28 ENCOUNTER — Ambulatory Visit: Payer: Self-pay | Admitting: Adult Health

## 2023-09-28 DIAGNOSIS — G4733 Obstructive sleep apnea (adult) (pediatric): Secondary | ICD-10-CM

## 2023-10-05 ENCOUNTER — Ambulatory Visit: Admitting: Student in an Organized Health Care Education/Training Program

## 2023-10-07 ENCOUNTER — Encounter: Payer: Self-pay | Admitting: Family Medicine

## 2023-10-07 DIAGNOSIS — I82421 Acute embolism and thrombosis of right iliac vein: Secondary | ICD-10-CM

## 2023-10-07 NOTE — Telephone Encounter (Signed)
 Xarelto  Last filled:  06/18/23, #30 Last OV:  06/22/23, AWV Next OV:  12/24/23, 6 mo f/u

## 2023-10-11 MED ORDER — RIVAROXABAN 10 MG PO TABS: 10.0000 mg | ORAL_TABLET | Freq: Every day | ORAL | 1 refills | Status: DC

## 2023-10-11 NOTE — Telephone Encounter (Signed)
 ERx

## 2023-10-14 DIAGNOSIS — G4733 Obstructive sleep apnea (adult) (pediatric): Secondary | ICD-10-CM | POA: Diagnosis not present

## 2023-10-21 ENCOUNTER — Ambulatory Visit
Attending: Student in an Organized Health Care Education/Training Program | Admitting: Student in an Organized Health Care Education/Training Program

## 2023-10-21 ENCOUNTER — Encounter: Payer: Self-pay | Admitting: Family Medicine

## 2023-10-21 ENCOUNTER — Telehealth: Payer: Self-pay | Admitting: *Deleted

## 2023-10-21 ENCOUNTER — Encounter: Payer: Self-pay | Admitting: Student in an Organized Health Care Education/Training Program

## 2023-10-21 VITALS — BP 121/92 | HR 88 | Temp 97.2°F | Resp 16 | Ht 67.5 in | Wt 162.0 lb

## 2023-10-21 DIAGNOSIS — M5030 Other cervical disc degeneration, unspecified cervical region: Secondary | ICD-10-CM | POA: Diagnosis not present

## 2023-10-21 DIAGNOSIS — M4802 Spinal stenosis, cervical region: Secondary | ICD-10-CM | POA: Insufficient documentation

## 2023-10-21 DIAGNOSIS — M5412 Radiculopathy, cervical region: Secondary | ICD-10-CM | POA: Insufficient documentation

## 2023-10-21 NOTE — Progress Notes (Signed)
 PROVIDER NOTE: Interpretation of information contained herein should be left to medically-trained personnel. Specific patient instructions are provided elsewhere under Patient Instructions section of medical record. This document was created in part using AI and STT-dictation technology, any transcriptional errors that may result from this process are unintentional.  Patient: Alan Morrison  Service: E/M Encounter  Provider: Wallie Sherry, MD  DOB: 30-Jan-1951  Delivery: Face-to-face  Specialty: Interventional Pain Management  MRN: [id number]  Setting: Ambulatory outpatient facility  Specialty designation: 09  Type: New Patient  Location: Outpatient office facility  PCP: Alan Baller, MD  DOS: 10/21/2023    Referring Prov.: Alan Hastings, PA-C   Primary Reason(s) for Visit: Encounter for initial evaluation of one or more chronic problems (new to examiner) potentially causing chronic pain, and posing a threat to normal musculoskeletal function. (Level of risk: High) CC: Neck Pain  HPI  [name] is a 73 y.o. year old, male patient, who comes for the first time to our practice referred by Alan Hastings, PA-C for our initial evaluation of his chronic pain. He has Type 2 diabetes mellitus with chronic kidney disease, with long-term current use of insulin  (HCC); TESTICULAR HYPOFUNCTION; Coronary artery disease; Benign prostatic hyperplasia with urinary obstruction; OSA on CPAP; Essential hypertension, benign; Health maintenance examination; Body mass index (BMI) 25.0-25.9, adult; Vitamin D  deficiency; Hyperlipidemia associated with type 2 diabetes mellitus (HCC); BPV (benign positional vertigo); Ex-smoker; Hyperuricemia; Angiomyolipoma of left kidney; Fatty liver; Right hip pain; Angina pectoris (HCC); Stress-induced cardiomyopathy; History of MRSA infection; GERD (gastroesophageal reflux disease); Choroidal nevus; Cervical spondylosis without myelopathy; Anemia, iron  deficiency, inadequate dietary intake;  Advanced directives, counseling/discussion; Poliosis; History of recurrent deep vein thrombosis (DVT); Pain in both lower extremities; Acute deep vein thrombosis (DVT) of iliac vein of right lower extremity (HCC); Adrenal adenoma, left; Right lower lobe pulmonary nodule; Ischemic colitis (HCC); Dizziness; Carotid artery calcification, bilateral; Hypoglycemia; Medicare annual wellness visit, subsequent; Degeneration of cervical intervertebral disc; Foraminal stenosis of cervical region; and Cervical radicular pain on their problem list. Today he comes in for evaluation of his Neck Pain  Pain Assessment: Location:   Neck Radiating: denies Onset: More than a month ago Duration: Chronic pain Quality: Cramping Severity: 8 /10 (subjective, self-reported pain score)  Effect on ADL: limits adls, blurred vision Timing: Intermittent Modifying factors: stretching BP: (!) 121/92  HR: 88  Onset and Duration: Present longer than 3 months Cause of pain: Unknown Severity: Getting worse, NAS-11 at its worse: 8/10, and NAS-11 at its best: 0/10 Timing: Not influenced by the time of the day and During activity or exercise Aggravating Factors: Kneeling, Motion, Stooping , and Twisting Alleviating Factors: Resting and Sitting Associated Problems: Dizziness Quality of Pain: Cramping, Deep, Dreadful, Nagging, and Shooting Previous Examinations or Tests: CT scan, MRI scan, X-rays, and Neurological evaluation Previous Treatments: OTC, heat, NSAIDs  Alan Morrison is being evaluated for possible interventional pain management therapies for the treatment of his chronic pain.  Discussed the use of AI scribe software for clinical note transcription with the patient, who gave verbal consent to proceed.  History of Present Illness   Alan Morrison is a 73 year old male with cervical degenerative disc disease who presents with neck cramping. He was referred by neurosurgeons Alan Morrison and Alan Morrison for evaluation  of neck pain.  He experiences cramping in his neck, described as a tightening sensation rather than pain. The cramping is sporadic, occurring without a predictable pattern, and can go days without flaring up. It was  particularly tight upon waking up this morning. The sensation is localized to the neck area and is exacerbated by looking down or when he is in certain positions, such as working on the ground or using his riding lawnmower.  He underwent an MRI and CTA of the head and neck in early June to assess blood flow and structural issues. The MRI findings showed issues at C4-C5 and C5-C6. He has not experienced any weakness in his fingers, but his vision sometimes gets blurry, a symptom that has not been explained.  He is currently on Xarelto , a blood thinner, prescribed by a vascular doctor and managed by his primary care physician. He has been on this medication since experiencing leg swelling in 2020. He also takes aspirin  81 mg.       Meds   Current Outpatient Medications:    acetaminophen  (TYLENOL ) 500 MG tablet, Take 1,000 mg by mouth 2 (two) times daily as needed for headache (pain)., Disp: , Rfl:    aspirin  EC 81 MG tablet, Take 81 mg by mouth daily with supper. Swallow whole., Disp: , Rfl:    Calcium  Carbonate Antacid (TUMS PO), Take 1 tablet by mouth daily as needed (acid reflux/indigestion)., Disp: , Rfl:    Cholecalciferol  (VITAMIN D ) 2000 units tablet, Take 2,000 Units by mouth daily with breakfast. , Disp: , Rfl:    finasteride  (PROSCAR ) 5 MG tablet, Take 1 tablet (5 mg total) by mouth daily., Disp: 90 tablet, Rfl: 4   Insulin  Glargine (BASAGLAR  KWIKPEN) 100 UNIT/ML, Inject 60 Units into the skin at bedtime., Disp: , Rfl:    Insulin  Pen Needle (B-D ULTRAFINE III SHORT PEN) 31G X 8 MM MISC, 1 Device by Other route 4 (four) times daily. USE AS DIRECTED DAILY, Disp: 120 each, Rfl: 11   Iron , Ferrous Sulfate , 325 (65 Fe) MG TABS, Take 325 mg by mouth every other day., Disp: , Rfl:     losartan  (COZAAR ) 25 MG tablet, TAKE 1 TABLET (25 MG TOTAL) BY MOUTH DAILY., Disp: 90 tablet, Rfl: 3   metFORMIN  (GLUCOPHAGE ) 1000 MG tablet, Take 1 tablet (1,000 mg total) by mouth 2 (two) times daily with a meal., Disp: , Rfl:    metoprolol  tartrate (LOPRESSOR ) 25 MG tablet, TAKE 1/2 TABLET TWICE A DAY BY MOUTH, Disp: 90 tablet, Rfl: 3   Multiple Vitamin (MULTIVITAMIN WITH MINERALS) TABS tablet, Take 1 tablet by mouth daily with breakfast. , Disp: , Rfl:    nitroGLYCERIN  (NITROSTAT ) 0.4 MG SL tablet, PLACE 1 TABLET UNDER THE TONGUE EVERY 5 MINUTES AS NEEDED FOR CHEST PAIN, Disp: 25 tablet, Rfl: 11   ONETOUCH ULTRA test strip, USE TO CHECK SUGARS DAILY  AS DIRECTED, Disp: 100 strip, Rfl: 3   probenecid  (BENEMID ) 500 MG tablet, Take 0.5 tablets (250 mg total) by mouth every other day., Disp: 45 tablet, Rfl: 4   rivaroxaban  (XARELTO ) 10 MG TABS tablet, Take 1 tablet (10 mg total) by mouth daily., Disp: 90 tablet, Rfl: 1   rosuvastatin  (CRESTOR ) 40 MG tablet, Take 1 tablet (40 mg total) by mouth daily with supper., Disp: 90 tablet, Rfl: 3   tamsulosin  (FLOMAX ) 0.4 MG CAPS capsule, Take 1 capsule (0.4 mg total) by mouth daily., Disp: 90 capsule, Rfl: 4   tirzepatide  (MOUNJARO ) 12.5 MG/0.5ML Pen, Inject 12.5 mg into the skin once a week., Disp: , Rfl:   Imaging Review  Cervical Imaging: Cervical MR wo contrast: Results for orders placed during the hospital encounter of 08/12/23  MR Cervical Spine Wo  Contrast  Narrative CLINICAL DATA:  73 year old male with persistent neck pain.  EXAM: MRI CERVICAL SPINE WITHOUT CONTRAST  TECHNIQUE: Multiplanar, multisequence MR imaging of the cervical spine was performed. No intravenous contrast was administered.  COMPARISON:  Cervical spine radiographs 06/22/2023.  FINDINGS: Alignment: Similar straightening of cervical lordosis. Mild degenerative anterolisthesis C7 on T1. No significant cervical spondylolisthesis or scoliosis.  Vertebrae: Normal  background bone marrow signal. Chronic degenerative endplate marrow signal changes C4-C5 and C5-C6. Faint degenerative superimposed marrow edema at the anterior C5-C6 endplates (series 895, image 10). C5 benign vertebral hemangioma (normal variant). No other marrow edema.  Cord: Capacious cervical spinal canal at most levels, detailed below. Spinal cord signal and morphology remain within normal limits. Negative visible upper thoracic canal.  Posterior Fossa, vertebral arteries, paraspinal tissues: Cervicomedullary junction is within normal limits. Negative visible posterior fossa. Preserved major vascular flow voids in the bilateral neck, dominant left and diminutive right vertebral arteries (normal variant). Negative visible neck soft tissues, lung apices.  Disc levels:  C2-C3:  Negative.  C3-C4: Disc space loss. Mild disc bulging and endplate spurring asymmetric to the left (series 106, image 15). Narrows ventral CSF space there but no spinal stenosis. Borderline to mild left C4 foraminal stenosis.  C4-C5: Moderate to severe disc space loss. Circumferential disc osteophyte complex with narrowing ventral CSF space but no spinal stenosis. Mild facet hypertrophy. Mild left and mild-to-moderate right C5 foraminal stenosis.  C5-C6: Similar disc space loss. Circumferential disc osteophyte complex. Neuro ventral CSF space with no spinal stenosis. Moderate to severe bilateral C6 foraminal stenosis. Mild facet and ligament flavum hypertrophy.  C6-C7: Slightly less disc space loss. Mild circumferential disc osteophyte complex, broad-based posterior component of disc. No spinal stenosis. Mild left and moderate right C7 foraminal stenosis.  C7-T1: Mild grade 1 anterolisthesis. Mild to moderate facet and ligament flavum hypertrophy. No spinal stenosis. Mild bilateral C8 foraminal stenosis.  IMPRESSION: 1. Chronic cervical spine degeneration most pronounced at C4-C5 and C5-C6.  Faint degenerative endplate marrow edema at the latter. Mild degenerative spondylolisthesis at C7-T1. 2. No cervical spinal stenosis. Moderate to severe bilateral C6 neural foraminal stenosis, up to moderate at the right C5 and C7 nerve levels.   Electronically Signed By: VEAR Hurst M.D. On: 08/26/2023 13:06 DG Cervical Spine With Flex & Extend  Narrative CLINICAL DATA:  cervical neck pain and dizziness with neck flexion  EXAM: CERVICAL SPINE COMPLETE WITH FLEXION AND EXTENSION VIEWS  COMPARISON:  10/26/2022  FINDINGS: Lateral radiographs of the cervical spine were obtained in neutral, flexion, and extension positioning. Grade 1 anterolisthesis of C7 on T1 without dynamic listhesis. There is grade 1 anterolisthesis of C3 on C4 in flexion which reduces upon extension. Multilevel degenerative disc disease with disc height loss and endplate spurring most pronounced at the C4-5 through C6-7 levels. Degenerative facet arthropathy not well assessed in the absence of oblique views. No fracture identified. No prevertebral soft tissue swelling.  IMPRESSION: 1. Grade 1 anterolisthesis of C3 on C4 in flexion which reduces upon extension. 2. Grade 1 anterolisthesis of C7 on T1 without dynamic listhesis. 3. Multilevel degenerative disc disease most pronounced at the C4-5 through C6-7 levels.   Electronically Signed By: Mabel Converse D.O. On: 07/09/2023 13:33   Narrative CLINICAL DATA:  neck pain for months, dizziness with flexing neck  EXAM: CERVICAL SPINE - COMPLETE 4+ VIEW  COMPARISON:  None Available.  FINDINGS: The cervical spine is visualized from C1-C7. Trace retrolisthesis is C5-6. Vertebral body heights are maintained:  no evidence of acute fracture. Moderate to severe intervertebral disc space height loss at C4-5, C5-6 and C6-7. Multilevel uncovertebral hypertrophy and facet arthropathy results in osseous neuroforaminal narrowing most pronounced at bilateral C3-4,  C5-6 and C4-5. No prevertebral soft tissue swelling. Calcific densities overlying the LEFT greater than RIGHT neck most consistent with carotid bulb calcifications.  IMPRESSION: 1.  Multilevel degenerative changes of the cervical spine.  2.  Carotid bulb calcifications.   Electronically Signed By: Corean Salter M.D. On: 10/29/2022 19:30 CT Lumbar Spine W Contrast  Narrative Clinical Data: Continuing back and bilateral leg pain. LUMBAR MYELOGRAM: Comparison: Previous Myelo/CT, 11/15/03 from [facility]. Technique: Following informed consent, sterile preparation of the back, and adequate local anesthesia, a lumbar puncture was performed using a 22 gauge spinal needle at L5-S1 from a right paramedian approach. Fluid was clear and colorless. 15 cc of Omnipaque  180 was instilled in the subarachnoid space. Findings: AP, lateral, and oblique views demonstrate mild effacement of both L5 roots at the L4-5 level. This is due to a central disk protrusion. Flexion/extension lateral films show persistence of the defect, slightly worse in neutral and extension. It does not appear significantly changed from 2005. No new findings are appreciated. No abnormal movement is present.  Impression Stable extradural defect L4-5 centrally with mass effect on both L5 roots slightly worse on the right. POST MYELOGRAM CT SCAN OF THE LUMBAR SPINE: Technique: Multidetector CT imaging of the lumbar spine was performed after intrathecal injection of contrast. Multiplanar CT image reconstructions were also generated. Findings: L1-2: Normal interspace. L2-3: Mild bulge. L3-4: Minimal anterior osteophyte formation. Shallow bulge to the left. No L3 or L4 nerve root encroachment, however. L4-5: Central disk protrusion extending more to the right. Mild mass effect on both L5 nerve roots. L5-S1: Normal interspace. Compared with the CT from 2005, the appearance at L4-5 is stable to slightly improved. The appearance at L2-3 and  L3-4 are unchanged. There does not appear not be significant spinal stenosis or significant nerve root compression at any level. IMPRESSION: 1. Shallow central protrusion at L4-5 with no spinal stenosis, but mild mass effect on both L5 nerve roots; this has improved compared with 11/15/03. 2. Mild annular bulging L2-3 and L3-4. 3. There has been no dramatic interval change since the prior study; the degree of neural compression at L4-5 is minimal.     Provider: Rudolph Cirri   DG Myelogram Lumbar  Narrative Clinical Data: Continuing back and bilateral leg pain. LUMBAR MYELOGRAM: Comparison: Previous Myelo/CT, 11/15/03 from [facility]. Technique: Following informed consent, sterile preparation of the back, and adequate local anesthesia, a lumbar puncture was performed using a 22 gauge spinal needle at L5-S1 from a right paramedian approach. Fluid was clear and colorless. 15 cc of Omnipaque  180 was instilled in the subarachnoid space. Findings: AP, lateral, and oblique views demonstrate mild effacement of both L5 roots at the L4-5 level. This is due to a central disk protrusion. Flexion/extension lateral films show persistence of the defect, slightly worse in neutral and extension. It does not appear significantly changed from 2005. No new findings are appreciated. No abnormal movement is present.  Impression Stable extradural defect L4-5 centrally with mass effect on both L5 roots slightly worse on the right. POST MYELOGRAM CT SCAN OF THE LUMBAR SPINE: Technique: Multidetector CT imaging of the lumbar spine was performed after intrathecal injection of contrast. Multiplanar CT image reconstructions were also generated. Findings: L1-2: Normal interspace. L2-3: Mild bulge. L3-4: Minimal anterior osteophyte formation. Shallow bulge  to the left. No L3 or L4 nerve root encroachment, however. L4-5: Central disk protrusion extending more to the right. Mild mass effect on both L5 nerve roots. L5-S1: Normal  interspace. Compared with the CT from 2005, the appearance at L4-5 is stable to slightly improved. The appearance at L2-3 and L3-4 are unchanged. There does not appear not be significant spinal stenosis or significant nerve root compression at any level. IMPRESSION: 1. Shallow central protrusion at L4-5 with no spinal stenosis, but mild mass effect on both L5 nerve roots; this has improved compared with 11/15/03. 2. Mild annular bulging L2-3 and L3-4. 3. There has been no dramatic interval change since the prior study; the degree of neural compression at L4-5 is minimal.     Provider: Mitzie Redo   DG Epidurography  Narrative Clinical Data: The patient received significant relief from his first left L4-5 interlaminar epidural steroid injection. LUMBAR EPIDURAL INJECTION An interlaminar approach was performed on the left at L4-5. The overlying skin was cleansed and anesthetized. A 20 gauge Crawford epidural needle was advanced using loss-of-resistance technique.  DIAGNOSTIC EPIDURAL INJECTION Injection of Omnipaque  180 shows a good epidural pattern with spread above and below the level of needle placement, primarily on the left.  THERAPEUTIC EPIDURAL INJECTION 120 mg of Depo-Medrol mixed with 5 cc of 1 percent Lidocaine  were instilled. The procedure was well-tolerated, and the patient was discharged thirty minutes following the injection in good condition. IMPRESSION Technically successful second epidural injection on the left at L4-5.   Provider: Suzen Hines  DG HIP UNILAT WITH PELVIS 2-3 VIEWS RIGHT  Narrative CLINICAL DATA:  Right hip pain.  No mention of injury.  EXAM: DG HIP (WITH OR WITHOUT PELVIS) 2-3V RIGHT  COMPARISON:  None in PACs  FINDINGS: The bony pelvis is subjectively adequately mineralized. There is no lytic or blastic lesion nor acute or healing fracture. AP and lateral views of the the right hip reveal minimal symmetric narrowing of the joint space.  No significant osteophyte formation is observed. There is no acute or old fracture. The femoral neck, intertrochanteric, and subtrochanteric regions are normal.  IMPRESSION: Mild symmetric joint space loss of the right hip likely reflects osteoarthritis. No acute bony abnormality.   Electronically Signed By: David  [place] M.D. On: 06/10/2016 07:51  DG Hand Complete Right  Narrative CLINICAL DATA:  1st metacarpophalangeal and second metacarpophalangeal digit pain. Follow-up.  EXAM: RIGHT HAND - COMPLETE 3+ VIEW  COMPARISON:  Right hand radiographs 01/13/2023  FINDINGS: Mild thumb carpometacarpal joint space narrowing and peripheral osteophytosis. Mild second through fifth DIP joint space narrowing and peripheral osteophytosis, greatest at the third DIP joint. No acute fracture or dislocation.  IMPRESSION: Mild thumb carpometacarpal and second through fifth DIP osteoarthritis.   Electronically Signed By: Tanda Lyons M.D. On: 02/22/2023 20:48  Hand-L DG Complete: No results found for this or any previous visit.   Complexity Note: Imaging results reviewed.                         ROS  Cardiovascular: Heart trouble, Daily Aspirin  intake, High blood pressure, Heart attack ( Date: 2021), Heart surgery, and Blood thinners:  Antiplatelet Pulmonary or Respiratory: Snoring  and Temporary stoppage of breathing during sleep Neurological: No reported neurological signs or symptoms such as seizures, abnormal skin sensations, urinary and/or fecal incontinence, being born with an abnormal open spine and/or a tethered spinal cord Psychological-Psychiatric: No reported psychological or psychiatric signs or symptoms such  as difficulty sleeping, anxiety, depression, delusions or hallucinations (schizophrenial), mood swings (bipolar disorders) or suicidal ideations or attempts Gastrointestinal: No reported gastrointestinal signs or symptoms such as vomiting or evacuating blood, reflux,  heartburn, alternating episodes of diarrhea and constipation, inflamed or scarred liver, or pancreas or irrregular and/or infrequent bowel movements Genitourinary: No reported renal or genitourinary signs or symptoms such as difficulty voiding or producing urine, peeing blood, non-functioning kidney, kidney stones, difficulty emptying the bladder, difficulty controlling the flow of urine, or chronic kidney disease Hematological: Brusing easily Endocrine: High blood sugar requiring insulin  (IDDM) Rheumatologic: No reported rheumatological signs and symptoms such as fatigue, joint pain, tenderness, swelling, redness, heat, stiffness, decreased range of motion, with or without associated rash Musculoskeletal: Negative for myasthenia gravis, muscular dystrophy, multiple sclerosis or malignant hyperthermia Work History: Retired  Allergies  Mr. Heindl has no known allergies.  Laboratory Chemistry Profile   Renal Lab Results  Component Value Date   BUN 40 (H) 06/15/2023   CREATININE 1.48 06/15/2023   BCR 26 (H) 12/07/2022   GFR 47.14 (L) 06/15/2023   GFRAA >60 11/20/2019   GFRNONAA 60 (L) 10/11/2022   SPECGRAV >=1.030 (A) 10/22/2016   PHUR 6.0 10/22/2016   PROTEINUR 30 (A) 10/08/2022     Electrolytes Lab Results  Component Value Date   NA 143 06/15/2023   K 4.9 06/15/2023   CL 107 06/15/2023   CALCIUM  10.0 06/15/2023   MG 1.6 (L) 01/31/2022   PHOS 3.8 06/10/2022     Hepatic Lab Results  Component Value Date   AST 27 06/15/2023   ALT 21 06/15/2023   ALBUMIN 4.1 06/15/2023   ALKPHOS 46 06/15/2023   LIPASE 42 10/08/2022     ID Lab Results  Component Value Date   HIV Non Reactive 01/03/2022   SARSCOV2NAA NEGATIVE 01/02/2022   MRSAPCR NEGATIVE 11/12/2019   HCVAB NEGATIVE 05/27/2015     Bone Lab Results  Component Value Date   VD25OH 55.74 06/15/2023   TESTOSTERONE 169.80 (L) 03/07/2009     Endocrine Lab Results  Component Value Date   GLUCOSE 82 06/15/2023    GLUCOSEU NEGATIVE 10/08/2022   HGBA1C 5.4 06/15/2023   TSH 2.00 10/14/2021   TESTOSTERONE 169.80 (L) 03/07/2009     Neuropathy Lab Results  Component Value Date   VITAMINB12 297 06/15/2023   HGBA1C 5.4 06/15/2023   HIV Non Reactive 01/03/2022     CNS No results found for: COLORCSF, APPEARCSF, RBCCOUNTCSF, WBCCSF, POLYSCSF, LYMPHSCSF, EOSCSF, PROTEINCSF, GLUCCSF, JCVIRUS, CSFOLI, IGGCSF, LABACHR, ACETBL   Inflammation (CRP: Acute  ESR: Chronic) Lab Results  Component Value Date   LATICACIDVEN 1.2 10/08/2022     Rheumatology Lab Results  Component Value Date   LABURIC 6.4 06/15/2023     Coagulation Lab Results  Component Value Date   INR 1.2 11/12/2019   LABPROT 14.9 11/12/2019   APTT 30 11/12/2019   PLT 194.0 06/15/2023   DDIMER 1.35 (H) 01/02/2022     Cardiovascular Lab Results  Component Value Date   BNP 16.8 01/02/2022   HGB 14.7 06/15/2023   HCT 43.8 06/15/2023     Screening Lab Results  Component Value Date   SARSCOV2NAA NEGATIVE 01/02/2022   MRSAPCR NEGATIVE 11/12/2019   HCVAB NEGATIVE 05/27/2015   HIV Non Reactive 01/03/2022     Cancer No results found for: CEA, CA125, LABCA2   Allergens No results found for: ALMOND, APPLE, ASPARAGUS, AVOCADO, BANANA, BARLEY, BASIL, BAYLEAF, GREENBEAN, LIMABEAN, WHITEBEAN, BEEFIGE, REDBEET, BLUEBERRY, BROCCOLI, CABBAGE, MELON, CARROT, CASEIN,  CASHEWNUT, CAULIFLOWER, CELERY     Note: Lab results reviewed.  PFSH  Drug: Mr. Rimel  reports no history of drug use. Alcohol:  reports current alcohol use of about 2.0 standard drinks of alcohol per week. Tobacco:  reports that he quit smoking about 49 years ago. His smoking use included cigarettes, pipe, and cigars. He has a 1 pack-year smoking history. He has never been exposed to tobacco smoke. He has never used smokeless tobacco. Medical:  has a past medical history of Acute ST elevation  myocardial infarction (STEMI) of inferior wall (HCC) (11/12/2019), Anemia, iron  deficiency, inadequate dietary intake, Angiomyolipoma of left kidney (02/2016), Bilateral pulmonary embolism (HCC) (02/05/2022), Cervical spondylosis without myelopathy, Choroidal nevus (01/22/2011), COVID-19 virus infection (12/03/2022), Diabetes mellitus type II, DVT of deep femoral vein, left (HCC) (04/29/2018), Dyspepsia, Ex-smoker, Fatty liver (02/2016), GERD (gastroesophageal reflux disease), Headache(784.0), Heart murmur, HLD (hyperlipidemia), Hyperuricemia, Obesity, Sleep apnea, Stress-induced cardiomyopathy (09/27/1998), and Urosepsis (01/01-01/05/10). Family: family history includes CAD (age of onset: 62) in his brother; Cancer in his maternal uncle; Diabetes in his mother; Heart disease in his father and mother; Hypertension in his father; Migraines in his brother; Pulmonary fibrosis in his sister; Sjogren's syndrome in his sister.  Past Surgical History:  Procedure Laterality Date   BIOPSY  10/10/2022   Procedure: BIOPSY;  Surgeon: Federico Rosario BROCKS, MD;  Location: [facility] ENDOSCOPY;  Service: Gastroenterology;;   CARDIAC CATHETERIZATION     COLONOSCOPY  07/2013   1 benign polyp rpt 10 yrs Verda)   COLONOSCOPY WITH PROPOFOL  N/A 10/10/2022   Procedure: COLONOSCOPY WITH PROPOFOL ;  Surgeon: Federico Rosario BROCKS, MD;  Location: [facility] ENDOSCOPY;  Service: Gastroenterology;  Laterality: N/A;   CORONARY THROMBECTOMY N/A 11/12/2019   Procedure: Coronary Thrombectomy;  Surgeon: Morrison Victory ORN, MD;  Location: [facility] INVASIVE CV LAB;  Service: Cardiovascular;  Laterality: N/A;   CORONARY ULTRASOUND/IVUS N/A 01/30/2022   Procedure: Intravascular Ultrasound/IVUS;  Surgeon: Gretta Lonni PARAS, MD;  Location: [facility] INVASIVE CV LAB;  Service: Cardiovascular;  Laterality: N/A;  Venous   CORONARY/GRAFT ACUTE MI REVASCULARIZATION N/A 11/12/2019   Procedure: Coronary/Graft Acute MI Revascularization;  Surgeon: Morrison Victory ORN, MD;  Location: [facility] INVASIVE  CV LAB;  Service: Cardiovascular;  Laterality: N/A;   KNEE ARTHROSCOPY Left 1988   KNEE SURGERY Right 05/21/03   Right, Dr. Addie, med meniscus tear via MRI   LEFT HEART CATH AND CORONARY ANGIOGRAPHY N/A 04/01/2017   Procedure: LEFT HEART CATH AND CORONARY ANGIOGRAPHY;  Surgeon: [place], Peter M, MD;  Location: [facility] INVASIVE CV LAB;  Service: Cardiovascular;  Laterality: N/A;   LEFT HEART CATH AND CORONARY ANGIOGRAPHY N/A 11/12/2019   Procedure: LEFT HEART CATH AND CORONARY ANGIOGRAPHY;  Surgeon: Morrison Victory ORN, MD;  Location: [facility] INVASIVE CV LAB;  Service: Cardiovascular;  Laterality: N/A;   MYELOGRAM  08/05   bulging disc   PERIPHERAL VASCULAR BALLOON ANGIOPLASTY  01/30/2022   Procedure: PERIPHERAL VASCULAR BALLOON ANGIOPLASTY;  Surgeon: Gretta Lonni PARAS, MD;  Location: [facility] INVASIVE CV LAB;  Service: Cardiovascular;;   PERIPHERAL VASCULAR THROMBECTOMY Right 01/30/2022   Procedure: PERIPHERAL VASCULAR THROMBECTOMY;  Surgeon: Gretta Lonni PARAS, MD;  Location: [facility] INVASIVE CV LAB;  Service: Cardiovascular;  Laterality: Right;   Active Ambulatory Problems    Diagnosis Date Noted   Type 2 diabetes mellitus with chronic kidney disease, with long-term current use of insulin  (HCC) 10/18/2006   TESTICULAR HYPOFUNCTION 03/11/2009   Coronary artery disease 12/12/2001   Benign prostatic hyperplasia with urinary obstruction 02/04/2009   OSA on  CPAP 02/04/2009   Essential hypertension, benign 06/12/2010   Health maintenance examination 05/06/2011   Body mass index (BMI) 25.0-25.9, adult 06/08/2011   Vitamin D  deficiency 05/01/2012   Hyperlipidemia associated with type 2 diabetes mellitus (HCC)    BPV (benign positional vertigo) 12/13/2013   Ex-smoker    Hyperuricemia    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   Fatty liver 02/19/2016   Right hip pain 06/09/2016   Angina pectoris (HCC) 04/01/2017   Stress-induced cardiomyopathy 09/27/1998   History of MRSA infection 12/19/2013   GERD  (gastroesophageal reflux disease)    Choroidal nevus 01/22/2011   Cervical spondylosis without myelopathy    Anemia, iron  deficiency, inadequate dietary intake    Advanced directives, counseling/discussion 02/02/2019   Poliosis 10/14/2021   History of recurrent deep vein thrombosis (DVT) 01/03/2022   Pain in both lower extremities 01/15/2022   Acute deep vein thrombosis (DVT) of iliac vein of right lower extremity (HCC) 01/29/2022   Adrenal adenoma, left 02/05/2022   Right lower lobe pulmonary nodule 02/05/2022   Ischemic colitis (HCC) 10/08/2022   Dizziness 10/26/2022   Carotid artery calcification, bilateral 11/02/2022   Hypoglycemia 01/13/2023   Medicare annual wellness visit, subsequent 06/22/2023   Degeneration of cervical intervertebral disc 06/22/2023   Foraminal stenosis of cervical region 10/21/2023   Cervical radicular pain 10/21/2023   Resolved Ambulatory Problems    Diagnosis Date Noted   Other septicemia due to Gram-negative organism(038.49) 04/23/2008   Iron  deficiency 10/21/2006   History of cardiovascular disorder 12/15/2008   ARTHROSCOPY, KNEE, HX OF 12/15/2008   PALPITATIONS, OCCASIONAL 04/30/2010   Dyspnea on exertion 06/12/2010   Confusion 01/04/2012   Bronchitis 01/04/2012   Drug side effects 11/19/2014   Polycythemia 12/30/2014   Fatigue 06/06/2015   Chest discomfort 10/22/2016   Dyspepsia    Chest pain, atypical    Cough 06/29/2017   DVT of deep femoral vein, left (HCC) 04/29/2018   Welcome to Medicare preventive visit 05/31/2019   Cellulitis and abscess of buttock 07/07/2019   Acute ST elevation myocardial infarction (STEMI) of inferior wall (HCC) 11/12/2019   ST elevation myocardial infarction (STEMI) of inferior wall (HCC) 11/12/2019   Cellulitis of left lower extremity 11/26/2020   Other chest pain 06/17/2021   Right ankle pain 06/17/2021   Skin lesion 10/14/2021   AKI (acute kidney injury) (HCC) 01/03/2022   Hyperkalemia 01/03/2022    Elevated d-dimer 01/03/2022   Dyslipidemia 01/29/2022   BPH (benign prostatic hyperplasia) 01/29/2022   Essential hypertension 01/29/2022   CKD stage 3 due to type 2 diabetes mellitus (HCC) 01/29/2022   Bilateral pulmonary embolism (HCC) 02/05/2022   Blister of skin 02/05/2022   COVID-19 virus infection 12/03/2022   Right hand pain 01/13/2023   Past Medical History:  Diagnosis Date   Diabetes mellitus type II    Headache(784.0)    Heart murmur    HLD (hyperlipidemia)    Obesity    Sleep apnea    Urosepsis 01/01-01/05/10   Constitutional Exam  General appearance: Well nourished, well developed, and well hydrated. In no apparent acute distress Vitals:   10/21/23 1000  BP: (!) 121/92  Pulse: 88  Resp: 16  Temp: (!) 97.2 F (36.2 C)  SpO2: 100%  Weight: 162 lb (73.5 kg)  Height: 5' 7.5 (1.715 m)   BMI Assessment: Estimated body mass index is 25 kg/m as calculated from the following:   Height as of this encounter: 5' 7.5 (1.715 m).   Weight as of this encounter:  162 lb (73.5 kg).  BMI interpretation table: BMI level Category Range association with higher incidence of chronic pain  <18 kg/m2 Underweight   18.5-24.9 kg/m2 Ideal body weight   25-29.9 kg/m2 Overweight Increased incidence by 20%  30-34.9 kg/m2 Obese (Class I) Increased incidence by 68%  35-39.9 kg/m2 Severe obesity (Class II) Increased incidence by 136%  >40 kg/m2 Extreme obesity (Class III) Increased incidence by 254%   Patient's current BMI Ideal Body weight  Body mass index is 25 kg/m. Ideal body weight: 67.2 kg (148 lb 4.1 oz) Adjusted ideal body weight: 69.7 kg (153 lb 12.1 oz)   BMI Readings from Last 4 Encounters:  10/21/23 25.00 kg/m  09/07/23 25.85 kg/m  08/26/23 25.43 kg/m  06/22/23 26.47 kg/m   Wt Readings from Last 4 Encounters:  10/21/23 162 lb (73.5 kg)  09/07/23 167 lb 8 oz (76 kg)  08/26/23 164 lb 12.8 oz (74.8 kg)  06/22/23 165 lb 4 oz (75 kg)    Psych/Mental status:  Alert, oriented x 3 (person, place, & time)       Eyes: PERLA Respiratory: No evidence of acute respiratory distress  Cervical Spine Area Exam  Skin & Axial Inspection: No masses, redness, edema, swelling, or associated skin lesions Alignment: Symmetrical Functional ROM: Pain restricted ROM      Stability: No instability detected Muscle Tone/Strength: Functionally intact. No obvious neuro-muscular anomalies detected. Sensory (Neurological): Neurogenic pain pattern Palpation: No palpable anomalies            positive Spurling's bilaterally Upper Extremity (UE) Exam    Side: Right upper extremity  Side: Left upper extremity  Skin & Extremity Inspection: Skin color, temperature, and hair growth are WNL. No peripheral edema or cyanosis. No masses, redness, swelling, asymmetry, or associated skin lesions. No contractures.  Skin & Extremity Inspection: Skin color, temperature, and hair growth are WNL. No peripheral edema or cyanosis. No masses, redness, swelling, asymmetry, or associated skin lesions. No contractures.  Functional ROM: Unrestricted ROM          Functional ROM: Unrestricted ROM          Muscle Tone/Strength: Functionally intact. No obvious neuro-muscular anomalies detected.  Muscle Tone/Strength: Functionally intact. No obvious neuro-muscular anomalies detected.  Sensory (Neurological): Unimpaired          Sensory (Neurological): Unimpaired          Palpation: No palpable anomalies              Palpation: No palpable anomalies              Provocative Test(s):  Phalen's test: deferred Tinel's test: deferred Apley's scratch test (touch opposite shoulder):  Action 1 (Across chest): deferred Action 2 (Overhead): deferred Action 3 (LB reach): deferred   Provocative Test(s):  Phalen's test: deferred Tinel's test: deferred Apley's scratch test (touch opposite shoulder):  Action 1 (Across chest): deferred Action 2 (Overhead): deferred Action 3 (LB reach): deferred     Assessment   Primary Diagnosis & Pertinent Problem List: The primary encounter diagnosis was Foraminal stenosis of cervical region. Diagnoses of Cervical radicular pain and Degeneration of cervical intervertebral disc were also pertinent to this visit.  Visit Diagnosis (New problems to examiner): 1. Foraminal stenosis of cervical region   2. Cervical radicular pain   3. Degeneration of cervical intervertebral disc    Plan of Care (Initial workup plan)      Severe bilateral C6 foraminal stenosis   Severe bilateral C6 foraminal stenosis is causing nerve  compression, resulting in neck cramping and tightening. There is no central spinal stenosis. Symptoms may worsen with movements that decrease the distance between the ear and shoulder, further compressing the nerves. Imaging confirms severe compression, potentially worsened by movement. Plan for an epidural steroid injection on July 21st to manage pain. Stop Xarelto  three days before the procedure and take aspirin  81 mg instead, pending clearance from his PCP. Coordinate with the PCP for clearance to stop Xarelto  and schedule the procedure for July 21st, pending clearance.  Cervical degenerative disc disease   Cervical degenerative disc disease with arthritis in the discs is most prominent at C4-C5 and C5-C6. Symptoms include sporadic neck cramping and tightening, aggravated by certain positions and movements. Imaging shows significant nerve compression, though symptoms may not match imaging findings, possibly due to a high pain threshold. Discussed the option of a C7-T1 steroid injection to decrease inflammation. Discussed physical therapy to identify aggravating positions and strengthen muscles to protect nerves. Educated on maintaining neck alignment to prevent nerve compression and provided a pain diary to monitor frequency and intensity of painful episodes.       Procedure Orders         Cervical Epidural Injection     Provider-requested follow-up:  Return in about 18 days (around 11/08/2023) for C-ESI , in clinic [facility] (stop Eliquis  3 days prior).  Future Appointments  Date Time Provider Department Center  11/09/2023  2:30 PM Alan Morrison, [place] [facility] None  12/01/2023  9:30 AM Parrett, Madelin RAMAN, NP [facility] None  12/24/2023  9:00 AM Alan Baller, MD [facility]   I discussed the assessment and treatment plan with the patient. The patient was provided an opportunity to ask questions and all were answered. The patient agreed with the plan and demonstrated an understanding of the instructions.  Patient advised to call back or seek an in-person evaluation if the symptoms or condition worsens.  Duration of encounter: .  Total time on encounter, as per AMA guidelines included both the face-to-face and non-face-to-face time personally spent by the physician and/or other qualified health care professional(s) on the day of the encounter (includes time in activities that require the physician or other qualified health care professional and does not include time in activities normally performed by clinical staff). Physician's time may include the following activities when performed: Preparing to see the patient (e.g., pre-charting review of records, searching for previously ordered imaging, lab work, and nerve conduction tests) Review of prior analgesic pharmacotherapies. Reviewing PMP Interpreting ordered tests (e.g., lab work, imaging, nerve conduction tests) Performing post-procedure evaluations, including interpretation of diagnostic procedures Obtaining and/or reviewing separately obtained history Performing a medically appropriate examination and/or evaluation Counseling and educating the patient/family/caregiver Ordering medications, tests, or procedures Referring and communicating with other health care professionals (when not separately reported) Documenting clinical information in the electronic or other health  record Independently interpreting results (not separately reported) and communicating results to the patient/ family/caregiver Care coordination (not separately reported)  Note by: Alan Sherry, MD (TTS and AI technology used. I apologize for any typographical errors that were not detected and corrected.) Date: 10/21/2023; Time: 11:44 AM

## 2023-10-21 NOTE — Patient Instructions (Addendum)
 ______________________________________________________________________    General Risks and Possible Complications  Patient Responsibilities: It is important that you read this as it is part of your informed consent. It is our duty to inform you of the risks and possible complications associated with treatments offered to you. It is your responsibility as a patient to read this and to ask questions about anything that is not clear or that you believe was not covered in this document.  Patient's Rights: You have the right to refuse treatment. You also have the right to change your mind, even after initially having agreed to have the treatment done. However, under this last option, if you wait until the last second to change your mind, you may be charged for the materials used up to that point.  Introduction: Medicine is not an Visual merchandiser. Everything in Medicine, including the lack of treatment(s), carries the potential for danger, harm, or loss (which is by definition: Risk). In Medicine, a complication is a secondary problem, condition, or disease that can aggravate an already existing one. All treatments carry the risk of possible complications. The fact that a side effects or complications occurs, does not imply that the treatment was conducted incorrectly. It must be clearly understood that these can happen even when everything is done following the highest safety standards.  No treatment: You can choose not to proceed with the proposed treatment alternative. The "PRO(s)" would include: avoiding the risk of complications associated with the therapy. The "CON(s)" would include: not getting any of the treatment benefits. These benefits fall under one of three categories: diagnostic; therapeutic; and/or palliative. Diagnostic benefits include: getting information which can ultimately lead to improvement of the disease or symptom(s). Therapeutic benefits are those associated with the successful  treatment of the disease. Finally, palliative benefits are those related to the decrease of the primary symptoms, without necessarily curing the condition (example: decreasing the pain from a flare-up of a chronic condition, such as incurable terminal cancer).  General Risks and Complications: These are associated to most interventional treatments. They can occur alone, or in combination. They fall under one of the following six (6) categories: no benefit or worsening of symptoms; bleeding; infection; nerve damage; allergic reactions; and/or death. No benefits or worsening of symptoms: In Medicine there are no guarantees, only probabilities. No healthcare provider can ever guarantee that a medical treatment will work, they can only state the probability that it may. Furthermore, there is always the possibility that the condition may worsen, either directly, or indirectly, as a consequence of the treatment. Bleeding: This is more common if the patient is taking a blood thinner, either prescription or over the counter (example: Goody Powders, Fish oil, Aspirin , Garlic, etc.), or if suffering a condition associated with impaired coagulation (example: Hemophilia, cirrhosis of the liver, low platelet counts, etc.). However, even if you do not have one on these, it can still happen. If you have any of these conditions, or take one of these drugs, make sure to notify your treating physician. Infection: This is more common in patients with a compromised immune system, either due to disease (example: diabetes, cancer, human immunodeficiency virus [HIV], etc.), or due to medications or treatments (example: therapies used to treat cancer and rheumatological diseases). However, even if you do not have one on these, it can still happen. If you have any of these conditions, or take one of these drugs, make sure to notify your treating physician. Nerve Damage: This is more common when the treatment is  an invasive one, but it  can also happen with the use of medications, such as those used in the treatment of cancer. The damage can occur to small secondary nerves, or to large primary ones, such as those in the spinal cord and brain. This damage may be temporary or permanent and it may lead to impairments that can range from temporary numbness to permanent paralysis and/or brain death. Allergic Reactions: Any time a substance or material comes in contact with our body, there is the possibility of an allergic reaction. These can range from a mild skin rash (contact dermatitis) to a severe systemic reaction (anaphylactic reaction), which can result in death. Death: In general, any medical intervention can result in death, most of the time due to an unforeseen complication. ______________________________________________________________________     Get cardiac clearance from PCP to stop blood thinner 3 days prior

## 2023-10-21 NOTE — Progress Notes (Signed)
 Safety precautions to be maintained throughout the outpatient stay will include: orient to surroundings, keep bed in low position, maintain call bell within reach at all times, provide assistance with transfer out of bed and ambulation.

## 2023-10-25 ENCOUNTER — Telehealth: Payer: Self-pay

## 2023-10-25 ENCOUNTER — Encounter: Payer: Self-pay | Admitting: Student in an Organized Health Care Education/Training Program

## 2023-10-25 NOTE — Telephone Encounter (Signed)
 Received request from patient/spine clinic for anticoagulant management around upcoming surgical procedure cervical ESI.   Current blood thinner: xarelto  10mg  daily Indication for anticoagulation: h/o recurrent DVTs Bleeding risk of procedure: elevated  Recommend: reasonable to discontinue low dose xarelto  for 3 days prior to steroid injection procedure, recommend restarting as soon as deemed safe by Dr Marcelino.

## 2023-10-25 NOTE — Telephone Encounter (Signed)
 Pending auth- here's what they are sending me. I don't see any PT notes in epic...  'ADDITIONAL CLINICAL INFORMATION NEEDED: In order to reach a medical necessity  determination on this case, please provide Symptoms - Radicular pain in a dermatomal pattern,  and Active rehab program related to this request. The decision for this request is pended until the necessary information is provided. If we do not receive the required information by 10/26/23 this request will be forwarded to clinical review and a determination will be made based upon the clinical information you have already provided.

## 2023-10-26 NOTE — Telephone Encounter (Signed)
 I called the patient and let him know. He does not want to get PT at this time. He said if they deny it he will just go back to his neurosurgeon to see what the next steps are.

## 2023-10-27 NOTE — Telephone Encounter (Signed)
 Spoke with pt relaying Dr Talmadge instructions for Xarelto . Pt verbalizes understanding, states he'd received Dr Talmadge MyChart message and expresses his thanks for the call.

## 2023-11-05 ENCOUNTER — Encounter: Payer: Self-pay | Admitting: Student in an Organized Health Care Education/Training Program

## 2023-11-05 NOTE — Progress Notes (Signed)
 Referring Physician:  Rilla Baller, MD [address] [place],  [place] [address]  Primary Physician:  Rilla Baller, MD  History of Present Illness: 11/09/2023 [name] has a history of CAD, HTN, cardiomyopathy, history of recurrent DVTs, OSA, GERD, fatty liver, ischemic colitis, DM, hyperlipidemia, vertigo, BPH.   Last seen by me on 09/07/23 for intermittent neck pain. He has known cervical spondylosis with DDD C4-C7. He has multilevel foraminal stenosis and slight slip C7-T1. I don't appreciate a significant slip at C3-C4.   He declined PT and was referred to pain management. Cervical ESI was discussed and it appears it was denied by his insurance.  He was having blurry vision when he looks down- CTA of head and neck was done for this. Of note, he has seen [facility] in [place] in the past Alan Morrison).   He is here for follow up.   No change in symptoms. He continues with intermittent neck pain only when he looks down. Has some pain when he turns his head as well. No arm pain. No numbness, tingling, or weakness.   He is taking XARELTO . He takes prn tylenol .   He does not smoke.   Conservative measures:  Physical therapy: none Multimodal medical therapy including regular antiinflammatories: tylenol  Injections: none  Past Surgery: no spinal surgeries  Alan Morrison has no symptoms of cervical myelopathy.  The symptoms are causing a significant impact on the patient's life.   Review of Systems:  A 10 point review of systems is negative, except for the pertinent positives and negatives detailed in the HPI.  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   by US    Arthritis Early 2000's   Bilateral  pulmonary embolism (HCC) 02/05/2022   Cervical spondylosis without myelopathy    Choroidal nevus 01/22/2011   left, yearly eye exam, no diabetic retinopathy   COVID-19 virus infection 12/03/2022   Diabetes mellitus type II    DVT of deep femoral vein, left (HCC) 04/29/2018   LLE DVT from proximal femoral vein to popliteal vein into proximal calf (04/29/2018) started on xarelto  Heme (Magrinat) recommended continuing lifelong lower dose anticoagulant indefinitely (11/2018)   Dyspepsia    Ex-smoker    Fatty liver 02/2016   by US    GERD (gastroesophageal reflux disease)    Headache(784.0)    Heart murmur    Hx of   HLD (hyperlipidemia)    Hyperuricemia    Obesity    Sleep apnea    Stress-induced cardiomyopathy 09/27/1998   WNL, EF 64%   Urosepsis 01/01-01/05/10   Hospitalization    Past Surgical History: Past Surgical History:  Procedure Laterality Date   BIOPSY  10/10/2022   Procedure: BIOPSY;  Surgeon: Federico Rosario BROCKS, MD;  Location: [facility] ENDOSCOPY;  Service: Gastroenterology;;   CARDIAC CATHETERIZATION     COLONOSCOPY  07/2013   1 benign polyp rpt 10 yrs Verda)   COLONOSCOPY WITH PROPOFOL  N/A 10/10/2022   Procedure: COLONOSCOPY WITH PROPOFOL ;  Surgeon: Federico Rosario BROCKS, MD;  Location: [facility] ENDOSCOPY;  Service: Gastroenterology;  Laterality: N/A;   CORONARY THROMBECTOMY N/A 11/12/2019   Procedure: Coronary Thrombectomy;  Surgeon: Claudene Victory ORN, MD;  Location: [facility] INVASIVE CV LAB;  Service: Cardiovascular;  Laterality: N/A;  CORONARY ULTRASOUND/IVUS N/A 01/30/2022   Procedure: Intravascular Ultrasound/IVUS;  Surgeon: Gretta Lonni PARAS, MD;  Location: [facility] INVASIVE CV LAB;  Service: Cardiovascular;  Laterality: N/A;  Venous   CORONARY/GRAFT ACUTE MI REVASCULARIZATION N/A 11/12/2019   Procedure: Coronary/Graft Acute MI Revascularization;  Surgeon: Claudene Victory ORN, MD;  Location: [facility] INVASIVE CV LAB;  Service: Cardiovascular;  Laterality: N/A;   KNEE ARTHROSCOPY Left 1988   KNEE SURGERY  Right 05/21/03   Right, Dr. Addie, med meniscus tear via MRI   LEFT HEART CATH AND CORONARY ANGIOGRAPHY N/A 04/01/2017   Procedure: LEFT HEART CATH AND CORONARY ANGIOGRAPHY;  Surgeon: Swaziland, Peter M, MD;  Location: [facility] INVASIVE CV LAB;  Service: Cardiovascular;  Laterality: N/A;   LEFT HEART CATH AND CORONARY ANGIOGRAPHY N/A 11/12/2019   Procedure: LEFT HEART CATH AND CORONARY ANGIOGRAPHY;  Surgeon: Claudene Victory ORN, MD;  Location: [facility] INVASIVE CV LAB;  Service: Cardiovascular;  Laterality: N/A;   MYELOGRAM  08/05   bulging disc   PERIPHERAL VASCULAR BALLOON ANGIOPLASTY  01/30/2022   Procedure: PERIPHERAL VASCULAR BALLOON ANGIOPLASTY;  Surgeon: Gretta Lonni PARAS, MD;  Location: [facility] INVASIVE CV LAB;  Service: Cardiovascular;;   PERIPHERAL VASCULAR THROMBECTOMY Right 01/30/2022   Procedure: PERIPHERAL VASCULAR THROMBECTOMY;  Surgeon: Gretta Lonni PARAS, MD;  Location: [facility] INVASIVE CV LAB;  Service: Cardiovascular;  Laterality: Right;    Allergies: Allergies as of 11/09/2023   (No Known Allergies)    Medications: Outpatient Encounter Medications as of 11/09/2023  Medication Sig   acetaminophen  (TYLENOL ) 500 MG tablet Take 1,000 mg by mouth 2 (two) times daily as needed for headache (pain).   aspirin  EC 81 MG tablet Take 81 mg by mouth daily with supper. Swallow whole.   Calcium  Carbonate Antacid (TUMS PO) Take 1 tablet by mouth daily as needed (acid reflux/indigestion).   Cholecalciferol  (VITAMIN D ) 2000 units tablet Take 2,000 Units by mouth daily with breakfast.    finasteride  (PROSCAR ) 5 MG tablet Take 1 tablet (5 mg total) by mouth daily.   Insulin  Glargine (BASAGLAR  KWIKPEN) 100 UNIT/ML Inject 60 Units into the skin at bedtime.   Insulin  Pen Needle (B-D ULTRAFINE III SHORT PEN) 31G X 8 MM MISC 1 Device by Other route 4 (four) times daily. USE AS DIRECTED DAILY   Iron , Ferrous Sulfate , 325 (65 Fe) MG TABS Take 325 mg by mouth every other day.   losartan  (COZAAR ) 25 MG tablet TAKE 1 TABLET  (25 MG TOTAL) BY MOUTH DAILY.   metFORMIN  (GLUCOPHAGE ) 1000 MG tablet Take 1 tablet (1,000 mg total) by mouth 2 (two) times daily with a meal.   metoprolol  tartrate (LOPRESSOR ) 25 MG tablet TAKE 1/2 TABLET TWICE A DAY BY MOUTH   Multiple Vitamin (MULTIVITAMIN WITH MINERALS) TABS tablet Take 1 tablet by mouth daily with breakfast.    nitroGLYCERIN  (NITROSTAT ) 0.4 MG SL tablet PLACE 1 TABLET UNDER THE TONGUE EVERY 5 MINUTES AS NEEDED FOR CHEST PAIN   ONETOUCH ULTRA test strip USE TO CHECK SUGARS DAILY  AS DIRECTED   probenecid  (BENEMID ) 500 MG tablet Take 0.5 tablets (250 mg total) by mouth every other day.   rivaroxaban  (XARELTO ) 10 MG TABS tablet Take 1 tablet (10 mg total) by mouth daily.   rosuvastatin  (CRESTOR ) 40 MG tablet Take 1 tablet (40 mg total) by mouth daily with supper.   tamsulosin  (FLOMAX ) 0.4 MG CAPS capsule Take 1 capsule (0.4 mg total) by mouth daily.   tirzepatide  (MOUNJARO ) 12.5 MG/0.5ML Pen Inject 12.5 mg into the skin once a week.  No facility-administered encounter medications on file as of 11/09/2023.    Social History: Social History   Tobacco Use   Smoking status: Former    Current packs/day: 0.00    Average packs/day: 0.5 packs/day for 2.0 years (1.0 ttl pk-yrs)    Types: Cigarettes, Pipe, Cigars    Quit date: 04/20/1974    Years since quitting: 49.5    Passive exposure: Never   Smokeless tobacco: Never   Tobacco comments:    quit over 20 years  Vaping Use   Vaping status: Never Used  Substance Use Topics   Alcohol use: Yes    Alcohol/week: 2.0 standard drinks of alcohol    Types: 1 Cans of beer, 1 Shots of liquor per week    Comment: Ocassional   Drug use: Never    Family Medical History: Family History  Problem Relation Age of Onset   Heart disease Mother        CHF   Diabetes Mother    Heart disease Father        CHF   Hypertension Father    Pulmonary fibrosis Sister    Sjogren's syndrome Sister    Migraines Brother        severe headaches  from arsenic in the past from  wood that was treated on his deck   CAD Brother 54       stent   Cancer Maternal Uncle        unsure   Stroke Neg Hx    Colon cancer Neg Hx     Physical Examination: Vitals:   11/09/23 1435  BP: 114/74      Awake, alert, oriented to person, place, and time.  Speech is clear and fluent. Fund of knowledge is appropriate.   Cranial Nerves: Pupils equal round and reactive to light.  Facial tone is symmetric.    No posterior cervical tenderness.   No abnormal lesions on exposed skin.   Strength: Side Biceps Triceps Deltoid Interossei Grip Wrist Ext. Wrist Flex.  R 5 5 5 5 5 5 5   L 5 5 5 5 5 5 5    Reflexes are 2+ and symmetric at the biceps, brachioradialis.  Hoffman's is absent.   Bilateral upper extremity sensation is intact to light touch.     Gait is normal.     Medical Decision Making  Imaging: CTA of head and neck dated 09/20/23:  FINDINGS: CT HEAD FINDINGS   Brain: There is no evidence of an acute infarct, intracranial hemorrhage, mass, midline shift, or extra-axial fluid collection. Mild generalized cerebral atrophy is within normal limits for age. Cerebral white matter hypodensities are nonspecific but compatible with mild chronic small vessel ischemic disease.   Vascular: Calcified atherosclerosis at the skull base. No hyperdense vessel.   Skull: No acute fracture or suspicious lesion.   Sinuses/Orbits: Paranasal sinuses and mastoid air cells are clear. Unremarkable orbits.   Other: None.   Review of the MIP images confirms the above findings   CTA NECK FINDINGS   Aortic arch: Standard branching with mild atherosclerosis. Widely patent brachiocephalic and subclavian arteries.   Right carotid system: Patent with mild, scattered calcified and soft plaque throughout the common carotid artery and at the carotid bifurcation. No evidence of a significant stenosis or dissection. Tortuous distal cervical ICA.   Left  carotid system: Patent with mild, scattered calcified and soft plaque in the common carotid artery and carotid bulb. No evidence of a significant stenosis or dissection.   Vertebral  arteries: Patent without evidence of stenosis or dissection. Strongly dominant left vertebral artery.   Skeleton: Cervical disc degeneration, most advanced from C4-5 through C6-7.   Other neck: No evidence of cervical lymphadenopathy or mass.   Upper chest: No mass or consolidation in the lung apices.   Review of the MIP images confirms the above findings   CTA HEAD FINDINGS   Anterior circulation: The internal carotid arteries are patent from skull base to carotid termini with mild atherosclerosis not resulting in a significant stenosis. ACAs and MCAs are patent without evidence of a proximal branch occlusion or significant proximal stenosis. No aneurysm is identified.   Posterior circulation: The intracranial vertebral arteries are patent with the left supplying the basilar in the right terminating in PICA. Patent SCA origins are visualized bilaterally. The basilar artery is widely patent. There patent posterior communicating arteries, right larger than left. Both PCAs are patent without evidence of a significant proximal stenosis. No aneurysm is identified.   Venous sinuses: Patent.   Anatomic variants: Hypoplastic right vertebral artery terminating in PICA.   Review of the MIP images confirms the above findings   IMPRESSION: 1. No evidence of acute intracranial abnormality. 2. Mild atherosclerosis in the head and neck without a large vessel occlusion, significant stenosis, or aneurysm. 3.  Aortic Atherosclerosis (ICD10-I70.0).     Electronically Signed   By: Dasie Hamburg M.D.   On: 10/16/2023 10:59  I have personally reviewed the images and agree with the above interpretation.  Above CTA results reviewed with Dr. Claudene.  Assessment and Plan: Mr. Hurrell  continues with  intermittent neck pain only when he looks down. Has some pain when he turns his head as well. No arm pain. No numbness, tingling, or weakness.   He has known cervical spondylosis with DDD C4-C7. He has multilevel foraminal stenosis and slight slip C7-T1. I don't appreciate a significant slip at C3-C4  CTA of head and neck shows mild atherosclerosis in the head and neck.  Treatment options discussed with patient and following plan made:   - Injection denied because he has not tried PT. PT ordered for cervical spine at [facility] in [place].  - Referral to [facility] for mild atherosclerosis in the head and neck.  - Follow up with me with MyChart visit in 6-8 weeks.  - Message to Dr. Marcelino letting him know I ordered PT- not sure if he wanted to schedule f/u.----I will see him back after PT and send him back to Dr. Marcelino if he wants to proceed with injections.   I spent a total of 25 minutes in face-to-face and non-face-to-face activities related to this patient's care today including review of outside records, review of imaging, review of symptoms, physical exam, discussion of differential diagnosis, discussion of treatment options, and documentation.   Glade Boys PA-C Dept. of Neurosurgery

## 2023-11-09 ENCOUNTER — Encounter: Payer: Self-pay | Admitting: Orthopedic Surgery

## 2023-11-09 ENCOUNTER — Ambulatory Visit: Admitting: Orthopedic Surgery

## 2023-11-09 VITALS — BP 114/74 | Ht 67.5 in | Wt 165.0 lb

## 2023-11-09 DIAGNOSIS — M47812 Spondylosis without myelopathy or radiculopathy, cervical region: Secondary | ICD-10-CM

## 2023-11-09 DIAGNOSIS — M50321 Other cervical disc degeneration at C4-C5 level: Secondary | ICD-10-CM | POA: Diagnosis not present

## 2023-11-09 DIAGNOSIS — M50322 Other cervical disc degeneration at C5-C6 level: Secondary | ICD-10-CM

## 2023-11-09 DIAGNOSIS — M50323 Other cervical disc degeneration at C6-C7 level: Secondary | ICD-10-CM

## 2023-11-09 DIAGNOSIS — M5030 Other cervical disc degeneration, unspecified cervical region: Secondary | ICD-10-CM

## 2023-11-09 NOTE — Patient Instructions (Signed)
 It was so nice to see you today. Thank you so much for coming in.    I sent physical therapy orders to [facility] in [place]. You need to call them at the number below to schedule your visit.   I want you to see neurology at [facility] for further evaluation. They should call you to schedule an appointment or you can call them at [phone].   We have a MyChart visit scheduled in September, let me know if you need anything prior to this.   I will let Dr. Marcelino know that you are doing PT.   Please do not hesitate to call if you have any questions or concerns. You can also message me in MyChart.   Glade Boys PA-C [phone]     The physicians and staff at [facility] at [facility] are committed to providing excellent care. You may receive a survey asking for feedback about your experience at our office. We value you your feedback and appreciate you taking the time to to fill it out. The [facility] leadership team is also available to discuss your experience in person, feel free to contact us  [phone].

## 2023-11-13 DIAGNOSIS — G4733 Obstructive sleep apnea (adult) (pediatric): Secondary | ICD-10-CM | POA: Diagnosis not present

## 2023-11-17 ENCOUNTER — Ambulatory Visit

## 2023-11-17 NOTE — Therapy (Incomplete)
 OUTPATIENT PHYSICAL THERAPY CERVICAL EVALUATION   Patient Name: Alan Morrison MRN: [id number] DOB:June 09, 1950, 73 y.o., male Today's Date: 11/17/2023  END OF SESSION:   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   by US    Arthritis Early 2000's   Bilateral pulmonary embolism (HCC) 02/05/2022   Cervical spondylosis without myelopathy    Choroidal nevus 01/22/2011   left, yearly eye exam, no diabetic retinopathy   COVID-19 virus infection 12/03/2022   Diabetes mellitus type II    DVT of deep femoral vein, left (HCC) 04/29/2018   LLE DVT from proximal femoral vein to popliteal vein into proximal calf (04/29/2018) started on xarelto  Heme (Magrinat) recommended continuing lifelong lower dose anticoagulant indefinitely (11/2018)   Dyspepsia    Ex-smoker    Fatty liver 02/2016   by US    GERD (gastroesophageal reflux disease)    Headache(784.0)    Heart murmur    Hx of   HLD (hyperlipidemia)    Hyperuricemia    Obesity    Sleep apnea    Stress-induced cardiomyopathy 09/27/1998   WNL, EF 64%   Urosepsis 01/01-01/05/10   Hospitalization   Past Surgical History:  Procedure Laterality Date   BIOPSY  10/10/2022   Procedure: BIOPSY;  Surgeon: Federico Rosario BROCKS, MD;  Location: [facility] ENDOSCOPY;  Service: Gastroenterology;;   CARDIAC CATHETERIZATION     COLONOSCOPY  07/2013   1 benign polyp rpt 10 yrs Verda)   COLONOSCOPY WITH PROPOFOL  N/A 10/10/2022   Procedure: COLONOSCOPY WITH PROPOFOL ;  Surgeon: Federico Rosario BROCKS, MD;  Location: [facility] ENDOSCOPY;  Service: Gastroenterology;  Laterality: N/A;   CORONARY THROMBECTOMY N/A 11/12/2019   Procedure: Coronary Thrombectomy;  Surgeon: Claudene Victory ORN, MD;  Location: [facility] INVASIVE CV LAB;  Service: Cardiovascular;  Laterality: N/A;   CORONARY  ULTRASOUND/IVUS N/A 01/30/2022   Procedure: Intravascular Ultrasound/IVUS;  Surgeon: Gretta Lonni PARAS, MD;  Location: [facility] INVASIVE CV LAB;  Service: Cardiovascular;  Laterality: N/A;  Venous   CORONARY/GRAFT ACUTE MI REVASCULARIZATION N/A 11/12/2019   Procedure: Coronary/Graft Acute MI Revascularization;  Surgeon: Claudene Victory ORN, MD;  Location: [facility] INVASIVE CV LAB;  Service: Cardiovascular;  Laterality: N/A;   KNEE ARTHROSCOPY Left 1988   KNEE SURGERY Right 05/21/03   Right, Dr. Addie, med meniscus tear via MRI   LEFT HEART CATH AND CORONARY ANGIOGRAPHY N/A 04/01/2017   Procedure: LEFT HEART CATH AND CORONARY ANGIOGRAPHY;  Surgeon: [place], Peter M, MD;  Location: [facility] INVASIVE CV LAB;  Service: Cardiovascular;  Laterality: N/A;   LEFT HEART CATH AND CORONARY ANGIOGRAPHY N/A 11/12/2019   Procedure: LEFT HEART CATH AND CORONARY ANGIOGRAPHY;  Surgeon: Claudene Victory ORN, MD;  Location: [facility] INVASIVE CV LAB;  Service: Cardiovascular;  Laterality: N/A;   MYELOGRAM  08/05   bulging disc   PERIPHERAL VASCULAR BALLOON ANGIOPLASTY  01/30/2022   Procedure: PERIPHERAL VASCULAR BALLOON ANGIOPLASTY;  Surgeon: Gretta Lonni PARAS, MD;  Location: [facility] INVASIVE CV LAB;  Service: Cardiovascular;;   PERIPHERAL VASCULAR THROMBECTOMY Right 01/30/2022   Procedure: PERIPHERAL VASCULAR THROMBECTOMY;  Surgeon: Gretta Lonni PARAS, MD;  Location: [facility] INVASIVE CV LAB;  Service: Cardiovascular;  Laterality: Right;   Patient Active Problem List   Diagnosis Date Noted   Foraminal stenosis of cervical region 10/21/2023   Cervical radicular pain 10/21/2023  Medicare annual wellness visit, subsequent 06/22/2023   Degeneration of cervical intervertebral disc 06/22/2023   Hypoglycemia 01/13/2023   Carotid artery calcification, bilateral 11/02/2022   Dizziness 10/26/2022   Ischemic colitis (HCC) 10/08/2022   Adrenal adenoma, left 02/05/2022   Right lower lobe pulmonary nodule 02/05/2022   Acute deep vein thrombosis (DVT) of iliac  vein of right lower extremity (HCC) 01/29/2022   Pain in both lower extremities 01/15/2022   History of recurrent deep vein thrombosis (DVT) 01/03/2022   Poliosis 10/14/2021   Advanced directives, counseling/discussion 02/02/2019   GERD (gastroesophageal reflux disease)    Cervical spondylosis without myelopathy    Anemia, iron  deficiency, inadequate dietary intake    Angina pectoris (HCC) 04/01/2017   Right hip pain 06/09/2016   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   Fatty liver 02/19/2016   Hyperuricemia    Ex-smoker    History of MRSA infection 12/19/2013   BPV (benign positional vertigo) 12/13/2013   Hyperlipidemia associated with type 2 diabetes mellitus (HCC)    Vitamin D  deficiency 05/01/2012   Body mass index (BMI) 25.0-25.9, adult 06/08/2011   Health maintenance examination 05/06/2011   Choroidal nevus 01/22/2011   Essential hypertension, benign 06/12/2010   TESTICULAR HYPOFUNCTION 03/11/2009   Benign prostatic hyperplasia with urinary obstruction 02/04/2009   OSA on CPAP 02/04/2009   Type 2 diabetes mellitus with chronic kidney disease, with long-term current use of insulin  (HCC) 10/18/2006   Coronary artery disease 12/12/2001   Stress-induced cardiomyopathy 09/27/1998    PCP: Rilla Baller, MD   REFERRING PROVIDER: Hilma Hastings, PA-C   REFERRING DIAG:  [id number] (ICD-10-CM) - Cervical spondylosis  M50.30 (ICD-10-CM) - DDD (degenerative disc disease), cervical    THERAPY DIAG:  No diagnosis found.  Rationale for Evaluation and Treatment: Rehabilitation  ONSET DATE: ***  SUBJECTIVE:                                                                                                                                                                                                         SUBJECTIVE STATEMENT: *** Hand dominance: {MISC; OT HAND DOMINANCE:[phone]}  PERTINENT HISTORY:  ***  PAIN:  Are you having pain? Yes: NPRS scale: *** Pain location:  *** Pain description: *** Aggravating factors: *** Relieving factors: ***  PRECAUTIONS: {Therapy precautions:24002}  RED FLAGS: {PT Red Flags:29287}     WEIGHT BEARING RESTRICTIONS: {Yes ***/No:24003}  FALLS:  Has patient fallen in last 6 months? {fallsyesno:27318}  LIVING ENVIRONMENT: Lives with: {[facility] lives with:25569::lives with their family} Lives in: {Lives in:25570} Stairs: {opstairs:27293} Has following equipment at home: {  Assistive devices:23999}  OCCUPATION: ***  PLOF: {PLOF:24004}  PATIENT GOALS: ***  NEXT MD VISIT: ***  OBJECTIVE:  Note: Objective measures were completed at Evaluation unless otherwise noted.  DIAGNOSTIC FINDINGS:  IMPRESSION: 1. Chronic cervical spine degeneration most pronounced at C4-C5 and C5-C6. Faint degenerative endplate marrow edema at the latter. Mild degenerative spondylolisthesis at C7-T1. 2. No cervical spinal stenosis. Moderate to severe bilateral C6 neural foraminal stenosis, up to moderate at the right C5 and C7 nerve levels.  PATIENT SURVEYS:  {rehab surveys:24030}  COGNITION: Overall cognitive status: {cognition:24006}  SENSATION: {sensation:27233}  POSTURE: {posture:25561}  PALPATION: ***   CERVICAL ROM:   {AROM/PROM:27142} ROM A/PROM (deg) eval  Flexion   Extension   Right lateral flexion   Left lateral flexion   Right rotation   Left rotation    (Blank rows = not tested)  UPPER EXTREMITY ROM:  {AROM/PROM:27142} ROM Right eval Left eval  Shoulder flexion    Shoulder extension    Shoulder abduction    Shoulder adduction    Shoulder extension    Shoulder internal rotation    Shoulder external rotation    Elbow flexion    Elbow extension    Wrist flexion    Wrist extension    Wrist ulnar deviation    Wrist radial deviation    Wrist pronation    Wrist supination     (Blank rows = not tested)  UPPER EXTREMITY MMT:  MMT Right eval Left eval  Shoulder flexion    Shoulder extension     Shoulder abduction    Shoulder adduction    Shoulder extension    Shoulder internal rotation    Shoulder external rotation    Middle trapezius    Lower trapezius    Elbow flexion    Elbow extension    Wrist flexion    Wrist extension    Wrist ulnar deviation    Wrist radial deviation    Wrist pronation    Wrist supination    Grip strength     (Blank rows = not tested)  CERVICAL SPECIAL TESTS:  {Cervical special tests:25246}  FUNCTIONAL TESTS:  {Functional tests:24029}  TREATMENT DATE: ***                                                                                                                                 PATIENT EDUCATION:  Education details: *** Person educated: {Person educated:25204} Education method: {Education Method:25205} Education comprehension: {Education Comprehension:25206}  HOME EXERCISE PROGRAM: ***  ASSESSMENT:  CLINICAL IMPRESSION: Patient is a 73 y.o. male who was seen today for physical therapy evaluation and treatment for  M47.812 (ICD-10-CM) - Cervical spondylosis  M50.30 (ICD-10-CM) - DDD (degenerative disc disease), cervical  .   OBJECTIVE IMPAIRMENTS: {opptimpairments:25111}.   ACTIVITY LIMITATIONS: {activitylimitations:27494}  PARTICIPATION LIMITATIONS: {participationrestrictions:25113}  PERSONAL FACTORS: {Personal factors:25162} are also affecting patient's functional outcome.   REHAB POTENTIAL: {rehabpotential:25112}  CLINICAL DECISION MAKING: {clinical decision making:25114}  EVALUATION COMPLEXITY: {Evaluation complexity:25115}   GOALS:  SHORT TERM GOALS: Target date: ***  *** Baseline:  Goal status: INITIAL  2.  *** Baseline:  Goal status: INITIAL  3.  *** Baseline:  Goal status: INITIAL  4.  *** Baseline:  Goal status: INITIAL  5.  *** Baseline:  Goal status: INITIAL  6.  *** Baseline:  Goal status: INITIAL  LONG TERM GOALS: Target date: ***  *** Baseline:  Goal status: INITIAL  2.   *** Baseline:  Goal status: INITIAL  3.  *** Baseline:  Goal status: INITIAL  4.  *** Baseline:  Goal status: INITIAL  5.  *** Baseline:  Goal status: INITIAL  6.  *** Baseline:  Goal status: INITIAL   PLAN:  PT FREQUENCY: {rehab frequency:25116}  PT DURATION: {rehab duration:25117}  PLANNED INTERVENTIONS: {rehab planned interventions:25118::97110-Therapeutic exercises,97530- Therapeutic [phone]- Neuromuscular re-education,97535- Self Rjmz,02859- Manual therapy}  PLAN FOR NEXT SESSION: ***   Mathieu Schloemer, PT 11/17/2023, 5:47 AM

## 2023-11-18 NOTE — Therapy (Signed)
 OUTPATIENT PHYSICAL THERAPY CERVICAL EVALUATION   Patient Name: Alan Morrison MRN: [id number] DOB:April 15, 1951, 73 y.o., male Today's Date: 11/19/2023  END OF SESSION:  PT End of Session - 11/19/23 1000     Visit Number 1    Number of Visits 13    Date for PT Re-Evaluation 12/31/23    Authorization Type [employer] medicare    Progress Note Due on Visit 10    PT Start Time 1001    PT Stop Time 1044    PT Time Calculation (min) 43 min    Activity Tolerance Patient tolerated treatment well          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   by US    Arthritis Early 2000's   Bilateral pulmonary embolism (HCC) 02/05/2022   Cervical spondylosis without myelopathy    Choroidal nevus 01/22/2011   left, yearly eye exam, no diabetic retinopathy   COVID-19 virus infection 12/03/2022   Diabetes mellitus type II    DVT of deep femoral vein, left (HCC) 04/29/2018   LLE DVT from proximal femoral vein to popliteal vein into proximal calf (04/29/2018) started on xarelto  Heme (Magrinat) recommended continuing lifelong lower dose anticoagulant indefinitely (11/2018)   Dyspepsia    Ex-smoker    Fatty liver 02/2016   by US    GERD (gastroesophageal reflux disease)    Headache(784.0)    Heart murmur    Hx of   HLD (hyperlipidemia)    Hyperuricemia    Obesity    Sleep apnea    Stress-induced cardiomyopathy 09/27/1998   WNL, EF 64%   Urosepsis 01/01-01/05/10   Hospitalization   Past Surgical History:  Procedure Laterality Date   BIOPSY  10/10/2022   Procedure: BIOPSY;  Surgeon: Federico Rosario BROCKS, MD;  Location: [facility] ENDOSCOPY;  Service: Gastroenterology;;   CARDIAC CATHETERIZATION     COLONOSCOPY  07/2013   1 benign polyp rpt 10 yrs Verda)   COLONOSCOPY WITH PROPOFOL  N/A  10/10/2022   Procedure: COLONOSCOPY WITH PROPOFOL ;  Surgeon: Federico Rosario BROCKS, MD;  Location: [facility] ENDOSCOPY;  Service: Gastroenterology;  Laterality: N/A;   CORONARY THROMBECTOMY N/A 11/12/2019   Procedure: Coronary Thrombectomy;  Surgeon: Claudene Victory ORN, MD;  Location: [facility] INVASIVE CV LAB;  Service: Cardiovascular;  Laterality: N/A;   CORONARY ULTRASOUND/IVUS N/A 01/30/2022   Procedure: Intravascular Ultrasound/IVUS;  Surgeon: Gretta Lonni PARAS, MD;  Location: [facility] INVASIVE CV LAB;  Service: Cardiovascular;  Laterality: N/A;  Venous   CORONARY/GRAFT ACUTE MI REVASCULARIZATION N/A 11/12/2019   Procedure: Coronary/Graft Acute MI Revascularization;  Surgeon: Claudene Victory ORN, MD;  Location: [facility] INVASIVE CV LAB;  Service: Cardiovascular;  Laterality: N/A;   KNEE ARTHROSCOPY Left 1988   KNEE SURGERY Right 05/21/03   Right, Dr. Addie, med meniscus tear via MRI   LEFT HEART CATH AND CORONARY ANGIOGRAPHY N/A 04/01/2017   Procedure: LEFT HEART CATH AND CORONARY ANGIOGRAPHY;  Surgeon: [place], Peter M, MD;  Location: [facility] INVASIVE CV LAB;  Service: Cardiovascular;  Laterality: N/A;   LEFT HEART CATH AND CORONARY ANGIOGRAPHY N/A 11/12/2019   Procedure: LEFT HEART CATH AND CORONARY ANGIOGRAPHY;  Surgeon: Claudene Victory ORN, MD;  Location: [facility] INVASIVE CV LAB;  Service: Cardiovascular;  Laterality: N/A;   MYELOGRAM  08/05   bulging disc   PERIPHERAL  VASCULAR BALLOON ANGIOPLASTY  01/30/2022   Procedure: PERIPHERAL VASCULAR BALLOON ANGIOPLASTY;  Surgeon: Gretta Lonni PARAS, MD;  Location: [facility] INVASIVE CV LAB;  Service: Cardiovascular;;   PERIPHERAL VASCULAR THROMBECTOMY Right 01/30/2022   Procedure: PERIPHERAL VASCULAR THROMBECTOMY;  Surgeon: Gretta Lonni PARAS, MD;  Location: [facility] INVASIVE CV LAB;  Service: Cardiovascular;  Laterality: Right;   Patient Active Problem List   Diagnosis Date Noted   Foraminal stenosis of cervical region 10/21/2023   Cervical radicular pain 10/21/2023   Medicare annual wellness visit,  subsequent 06/22/2023   Degeneration of cervical intervertebral disc 06/22/2023   Hypoglycemia 01/13/2023   Carotid artery calcification, bilateral 11/02/2022   Dizziness 10/26/2022   Ischemic colitis (HCC) 10/08/2022   Adrenal adenoma, left 02/05/2022   Right lower lobe pulmonary nodule 02/05/2022   Acute deep vein thrombosis (DVT) of iliac vein of right lower extremity (HCC) 01/29/2022   Pain in both lower extremities 01/15/2022   History of recurrent deep vein thrombosis (DVT) 01/03/2022   Poliosis 10/14/2021   Advanced directives, counseling/discussion 02/02/2019   GERD (gastroesophageal reflux disease)    Cervical spondylosis without myelopathy    Anemia, iron  deficiency, inadequate dietary intake    Angina pectoris (HCC) 04/01/2017   Right hip pain 06/09/2016   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   Fatty liver 02/19/2016   Hyperuricemia    Ex-smoker    History of MRSA infection 12/19/2013   BPV (benign positional vertigo) 12/13/2013   Hyperlipidemia associated with type 2 diabetes mellitus (HCC)    Vitamin D  deficiency 05/01/2012   Body mass index (BMI) 25.0-25.9, adult 06/08/2011   Health maintenance examination 05/06/2011   Choroidal nevus 01/22/2011   Essential hypertension, benign 06/12/2010   TESTICULAR HYPOFUNCTION 03/11/2009   Benign prostatic hyperplasia with urinary obstruction 02/04/2009   OSA on CPAP 02/04/2009   Type 2 diabetes mellitus with chronic kidney disease, with long-term current use of insulin  (HCC) 10/18/2006   Coronary artery disease 12/12/2001   Stress-induced cardiomyopathy 09/27/1998    PCP: Rilla Baller, MD  REFERRING PROVIDER: Hilma Hastings, PA-C  REFERRING DIAG: [phone] (ICD-10-CM) - Cervical spondylosis M50.30 (ICD-10-CM) - DDD (degenerative disc disease), cervical  THERAPY DIAG:  Cervicalgia  Abnormal posture  Rationale for Evaluation and Treatment: Rehabilitation  ONSET DATE: a couple years  SUBJECTIVE:                                                                                                                                                                                                          SUBJECTIVE STATEMENT: Began a couple years ago,  occasional. No MOI or change in activities recalled. Worsening over past six months. Pt has had extensive work up with MRI, CTA, XR. Seen pain management and neurosurgery, referred from neurosurgery. States he was referred to neurology as well, has spoken with eye doctor about his vision issues. No n/t or UE pain.  States he hasn't yet had any treatment for it. States injection was recommended but denied by insurance. Tightness is more predictable but states vision/dizziness is not consistently easy to provoke.    PERTINENT HISTORY:  STEMI, PE/DVT hx, DM2, headache, CAD, OSA + CPAP, dizziness, ischemic colitis  PAIN:  Are you having pain: 3-4/10 Location/description: neck tightness, central/periscapular Best-worst over past week: 0-5/10  - aggravating factors: turning head, looking down, stooping forward, squatting, riding lawnmower - Easing factors: changing positioning, stretching  PRECAUTIONS: DVT/PE hx, cardiac hx  RED FLAGS: No fevers/chills, no nausea, no unintentional weight loss (on mounjaro ). Hx headaches.  Dizziness/vision issues since neck pain began, providers aware and significant work up performed. Describes vision issues as cloudiness and difficulty focusing vision. Difficulty describing clear/consistent provocative factors    WEIGHT BEARING RESTRICTIONS: No  FALLS:  Has patient fallen in last 6 months? No  LIVING ENVIRONMENT: 1 story home, no STE Lives w/ spouse who he states takes care of housework, pt does yard work  OCCUPATION: retired; used to do maintenance  PLOF: Independent  PATIENT GOALS: eliminate pain  NEXT MD VISIT: telehealth in September, neurologist TBD  OBJECTIVE:  Note: Objective measures were completed at  Evaluation unless otherwise noted.  DIAGNOSTIC FINDINGS:  CTA head/neck 09/20/23: IMPRESSION: 1. No evidence of acute intracranial abnormality. 2. Mild atherosclerosis in the head and neck without a large vessel occlusion, significant stenosis, or aneurysm. 3.  Aortic Atherosclerosis (ICD10-I70.0).  MRI C spine 08/12/23: IMPRESSION: 1. Chronic cervical spine degeneration most pronounced at C4-C5 and C5-C6. Faint degenerative endplate marrow edema at the latter. Mild degenerative spondylolisthesis at C7-T1. 2. No cervical spinal stenosis. Moderate to severe bilateral C6 neural foraminal stenosis, up to moderate at the right C5 and C7 nerve levels.  C spine XR flex/ext 06/22/23: IMPRESSION: 1. Grade 1 anterolisthesis of C3 on C4 in flexion which reduces upon extension. 2. Grade 1 anterolisthesis of C7 on T1 without dynamic listhesis. 3. Multilevel degenerative disc disease most pronounced at the C4-5 through C6-7 levels.  PATIENT SURVEYS:  NDI: 7/50  COGNITION: Overall cognitive status: Within functional limits for tasks assessed  SENSATION: LT intact BIL UE   POSTURE: FHP rounded shoulders  PALPATION: Concordant TTP BIL LS, UT, rhomboids   CERVICAL ROM:   ROM A/PROM (deg) eval  Flexion 50 deg s  Extension 30 deg  Right lateral flexion 40 deg s  Left lateral flexion 18 deg s  Right rotation 35 deg s   Left rotation 45 deg s   (Blank rows = not tested) (Key: WFL = within functional limits not formally assessed, * = concordant pain, s = stiffness/stretching sensation, NT = not tested) Comments:   UPPER EXTREMITY ROM:  A/PROM Right eval Left eval  Shoulder flexion 153 deg s 151 deg s  Shoulder abduction    Shoulder internal rotation    Shoulder external rotation    Elbow flexion    Elbow extension    Wrist flexion    Wrist extension     (Blank rows = not tested) (Key: WFL = within functional limits not formally assessed, * = concordant pain, s =  stiffness/stretching sensation, NT = not tested)  Comments:    UPPER EXTREMITY MMT:  MMT Right eval Left eval  Shoulder flexion 4+ 4+  Shoulder extension    Shoulder abduction 5 5  Shoulder extension 4+ 4+  Shoulder internal rotation    Shoulder external rotation    Elbow flexion 5 5  Elbow extension 4+ 4+  Grip strength 60# 60#  (Blank rows = not tested)  (Key: WFL = within functional limits not formally assessed, * = concordant pain, s = stiffness/stretching sensation, NT = not tested)  Comments:     TREATMENT DATE:  [facility] Adult PT Treatment:                                                DATE: 11/19/23 Therapeutic Exercise: Scapular retraction x10 Shoulder rolls x10 each way HEP handout + education, rationale for interventions, relevant anatomy/physiology as pertains to exam/exercise                                                                                                                PATIENT EDUCATION:  Education details: Pt education on PT impairments, prognosis, and POC. Informed consent. Rationale for interventions, safe/appropriate HEP performance Person educated: Patient Education method: Explanation, Demonstration, Tactile cues, Verbal cues Education comprehension: verbalized understanding, returned demonstration, verbal cues required, tactile cues required, and needs further education    HOME EXERCISE PROGRAM: Access Code: [email or web address] URL: [email or web address] Date: 11/19/2023 Prepared by: Alm Jenny  Exercises - Seated Scapular Retraction  - 2-3 x daily - 1 sets - 10 reps - Seated Shoulder Rolls  - 2-3 x daily - 1 sets - 10 reps  ASSESSMENT:  CLINICAL IMPRESSION: Patient is a pleasant 73 y.o. gentleman who was seen today for physical therapy evaluation and treatment for neck pain/tightness. He does endorse some vision/dizziness issues which providers are aware of, extensive imaging work up as above (refer to [facility] for details). Overall  red flag questioning is reassuring. On exam he demonstrates concordant limitations in cervical mobility, postural deficits, mild postural/GH weakness, and concordant TTP periscapular/cervical musculature. Tolerates exam/HEP well without adverse event or provocation of dizziness/visual issues. Recommend trial of skilled PT to address aforementioned deficits with aim of improving functional tolerance and reducing pain with typical activities. Pt departs today's session in no acute distress, all voiced concerns/questions addressed appropriately from PT perspective.      OBJECTIVE IMPAIRMENTS: decreased activity tolerance, decreased endurance, decreased mobility, decreased ROM, decreased strength, impaired UE functional use, postural dysfunction, and pain.   ACTIVITY LIMITATIONS: lifting and bending  PARTICIPATION LIMITATIONS: community activity and yard work  PERSONAL FACTORS: Age, Time since onset of injury/illness/exacerbation, and 3+ comorbidities: STEMI, PE/DVT hx, DM2, headache, CAD, OSA + CPAP, dizziness, ischemic colitis are also affecting patient's functional outcome.   REHAB POTENTIAL: Good  CLINICAL DECISION MAKING: Evolving/moderate complexity  EVALUATION COMPLEXITY: Moderate   GOALS:   SHORT TERM GOALS: Target date: 12/10/2023  Pt will demonstrate appropriate understanding and performance of initially prescribed HEP in order to facilitate improved independence with management of symptoms.  Baseline: HEP established  Goal status: INITIAL   2. Pt will report at least 25% improvement in overall pain levels over past week in order to facilitate improved tolerance to typical daily activities.   Baseline: 0-5/10  Goal status: INITIAL    LONG TERM GOALS: Target date: 12/31/2023  Pt will score less than or equal to <3/50 on NDI in order to demonstrate improved perception of function due to symptoms (MDC 10-13 pts per Neysa dunker al 2009, 2010). Baseline: 7/50 Goal status: INITIAL  2.  Pt will demonstrate at least 50 degrees of active cervical rotation ROM BIL in order to demonstrate improved environmental awareness and safety with driving.  Baseline: see ROM chart above Goal status: INITIAL  3. Pt will demonstrate MMT of 5/5 in tested groups for improved symmetry of UE strength and improved tolerance to functional movements.  Baseline: see MMT chart above Goal status: INITIAL   4. Pt will report at least 50% decrease in overall pain levels in past week in order to facilitate improved tolerance to basic ADLs/mobility.   Baseline: 0-5/10  Goal status: INITIAL     PLAN:  PT FREQUENCY: 2x/week  PT DURATION: 6 weeks  PLANNED INTERVENTIONS: 97164- PT Re-evaluation, 97750- Physical Performance Testing, 97110-Therapeutic exercises, 97530- Therapeutic activity, [id number]- Neuromuscular re-education, 97535- Self Care, [id number]- Manual therapy, 20560 (1-2 muscles), 20561 (3+ muscles)- Dry Needling, Patient/Family education, Taping, Joint mobilization, Spinal mobilization, Cryotherapy, and Moist heat  PLAN FOR NEXT SESSION: Review/update HEP PRN. Work on [employer] exercises as appropriate with emphasis on postural strength/endurance. Symptom modification strategies as indicated/appropriate.    Alm DELENA Jenny PT, DPT 11/19/2023 11:17 AM

## 2023-11-19 ENCOUNTER — Other Ambulatory Visit: Payer: Self-pay

## 2023-11-19 ENCOUNTER — Encounter: Payer: Self-pay | Admitting: Physical Therapy

## 2023-11-19 ENCOUNTER — Ambulatory Visit: Attending: Orthopedic Surgery | Admitting: Physical Therapy

## 2023-11-19 DIAGNOSIS — M542 Cervicalgia: Secondary | ICD-10-CM | POA: Insufficient documentation

## 2023-11-19 DIAGNOSIS — R293 Abnormal posture: Secondary | ICD-10-CM | POA: Insufficient documentation

## 2023-11-19 DIAGNOSIS — M47812 Spondylosis without myelopathy or radiculopathy, cervical region: Secondary | ICD-10-CM | POA: Diagnosis not present

## 2023-11-19 DIAGNOSIS — M5030 Other cervical disc degeneration, unspecified cervical region: Secondary | ICD-10-CM | POA: Diagnosis not present

## 2023-11-25 ENCOUNTER — Encounter: Payer: Self-pay | Admitting: Physical Therapy

## 2023-11-25 ENCOUNTER — Ambulatory Visit: Admitting: Physical Therapy

## 2023-11-25 DIAGNOSIS — M5030 Other cervical disc degeneration, unspecified cervical region: Secondary | ICD-10-CM | POA: Diagnosis not present

## 2023-11-25 DIAGNOSIS — R293 Abnormal posture: Secondary | ICD-10-CM | POA: Diagnosis not present

## 2023-11-25 DIAGNOSIS — M542 Cervicalgia: Secondary | ICD-10-CM | POA: Diagnosis not present

## 2023-11-25 DIAGNOSIS — M47812 Spondylosis without myelopathy or radiculopathy, cervical region: Secondary | ICD-10-CM | POA: Diagnosis not present

## 2023-11-25 NOTE — Therapy (Signed)
 OUTPATIENT PHYSICAL THERAPY TREATMENT   Patient Name: Alan Morrison MRN: [id number] DOB:Jun 10, 1950, 73 y.o., male Today's Date: 11/25/2023  END OF SESSION:  PT End of Session - 11/25/23 1440     Visit Number 2    Number of Visits 13    Date for PT Re-Evaluation 12/31/23    Authorization Type [employer] medicare    Progress Note Due on Visit 10    PT Start Time 1441    PT Stop Time 1525    PT Time Calculation (min) 44 min           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   by US    Arthritis Early 2000's   Bilateral pulmonary embolism (HCC) 02/05/2022   Cervical spondylosis without myelopathy    Choroidal nevus 01/22/2011   left, yearly eye exam, no diabetic retinopathy   COVID-19 virus infection 12/03/2022   Diabetes mellitus type II    DVT of deep femoral vein, left (HCC) 04/29/2018   LLE DVT from proximal femoral vein to popliteal vein into proximal calf (04/29/2018) started on xarelto  Heme (Magrinat) recommended continuing lifelong lower dose anticoagulant indefinitely (11/2018)   Dyspepsia    Ex-smoker    Fatty liver 02/2016   by US    GERD (gastroesophageal reflux disease)    Headache(784.0)    Heart murmur    Hx of   HLD (hyperlipidemia)    Hyperuricemia    Obesity    Sleep apnea    Stress-induced cardiomyopathy 09/27/1998   WNL, EF 64%   Urosepsis 01/01-01/05/10   Hospitalization   Past Surgical History:  Procedure Laterality Date   BIOPSY  10/10/2022   Procedure: BIOPSY;  Surgeon: Federico Rosario BROCKS, MD;  Location: [facility] ENDOSCOPY;  Service: Gastroenterology;;   CARDIAC CATHETERIZATION     COLONOSCOPY  07/2013   1 benign polyp rpt 10 yrs Verda)   COLONOSCOPY WITH PROPOFOL  N/A 10/10/2022   Procedure: COLONOSCOPY WITH PROPOFOL ;  Surgeon: Federico Rosario BROCKS, MD;  Location: [facility] ENDOSCOPY;  Service: Gastroenterology;  Laterality: N/A;   CORONARY THROMBECTOMY N/A 11/12/2019   Procedure: Coronary Thrombectomy;  Surgeon: Claudene Victory ORN, MD;  Location: [facility] INVASIVE CV LAB;  Service: Cardiovascular;  Laterality: N/A;   CORONARY ULTRASOUND/IVUS N/A 01/30/2022   Procedure: Intravascular Ultrasound/IVUS;  Surgeon: Gretta Lonni PARAS, MD;  Location: [facility] INVASIVE CV LAB;  Service: Cardiovascular;  Laterality: N/A;  Venous   CORONARY/GRAFT ACUTE MI REVASCULARIZATION N/A 11/12/2019   Procedure: Coronary/Graft Acute MI Revascularization;  Surgeon: Claudene Victory ORN, MD;  Location: [facility] INVASIVE CV LAB;  Service: Cardiovascular;  Laterality: N/A;   KNEE ARTHROSCOPY Left 1988   KNEE SURGERY Right 05/21/03   Right, Dr. Addie, med meniscus tear via MRI   LEFT HEART CATH AND CORONARY ANGIOGRAPHY N/A 04/01/2017   Procedure: LEFT HEART CATH AND CORONARY ANGIOGRAPHY;  Surgeon: Swazil[place], Peter M, MD;  Location: [facility] INVASIVE CV LAB;  Service: Cardiovascular;  Laterality: N/A;   LEFT HEART CATH AND CORONARY ANGIOGRAPHY N/A 11/12/2019   Procedure: LEFT HEART CATH AND CORONARY ANGIOGRAPHY;  Surgeon: Claudene Victory ORN, MD;  Location: [facility] INVASIVE CV LAB;  Service: Cardiovascular;  Laterality: N/A;   MYELOGRAM  08/05   bulging disc   PERIPHERAL VASCULAR BALLOON ANGIOPLASTY  01/30/2022   Procedure: PERIPHERAL  VASCULAR BALLOON ANGIOPLASTY;  Surgeon: Gretta Lonni PARAS, MD;  Location: [facility] INVASIVE CV LAB;  Service: Cardiovascular;;   PERIPHERAL VASCULAR THROMBECTOMY Right 01/30/2022   Procedure: PERIPHERAL VASCULAR THROMBECTOMY;  Surgeon: Gretta Lonni PARAS, MD;  Location: [facility] INVASIVE CV LAB;  Service: Cardiovascular;  Laterality: Right;   Patient Active Problem List   Diagnosis Date Noted   Foraminal stenosis of cervical region 10/21/2023   Cervical radicular pain 10/21/2023   Medicare annual wellness visit, subsequent 06/22/2023   Degeneration of cervical intervertebral disc  06/22/2023   Hypoglycemia 01/13/2023   Carotid artery calcification, bilateral 11/02/2022   Dizziness 10/26/2022   Ischemic colitis (HCC) 10/08/2022   Adrenal adenoma, left 02/05/2022   Right lower lobe pulmonary nodule 02/05/2022   Acute deep vein thrombosis (DVT) of iliac vein of right lower extremity (HCC) 01/29/2022   Pain in both lower extremities 01/15/2022   History of recurrent deep vein thrombosis (DVT) 01/03/2022   Poliosis 10/14/2021   Advanced directives, counseling/discussion 02/02/2019   GERD (gastroesophageal reflux disease)    Cervical spondylosis without myelopathy    Anemia, iron  deficiency, inadequate dietary intake    Angina pectoris (HCC) 04/01/2017   Right hip pain 06/09/2016   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   Fatty liver 02/19/2016   Hyperuricemia    Ex-smoker    History of MRSA infection 12/19/2013   BPV (benign positional vertigo) 12/13/2013   Hyperlipidemia associated with type 2 diabetes mellitus (HCC)    Vitamin D  deficiency 05/01/2012   Body mass index (BMI) 25.0-25.9, adult 06/08/2011   Health maintenance examination 05/06/2011   Choroidal nevus 01/22/2011   Essential hypertension, benign 06/12/2010   TESTICULAR HYPOFUNCTION 03/11/2009   Benign prostatic hyperplasia with urinary obstruction 02/04/2009   OSA on CPAP 02/04/2009   Type 2 diabetes mellitus with chronic kidney disease, with long-term current use of insulin  (HCC) 10/18/2006   Coronary artery disease 12/12/2001   Stress-induced cardiomyopathy 09/27/1998    PCP: Rilla Baller, MD  REFERRING PROVIDER: Hilma Hastings, PA-C  REFERRING DIAG: [phone] (ICD-10-CM) - Cervical spondylosis M50.30 (ICD-10-CM) - DDD (degenerative disc disease), cervical  THERAPY DIAG:  Cervicalgia  Abnormal posture  Rationale for Evaluation and Treatment: Rehabilitation  ONSET DATE: a couple years  SUBJECTIVE:                                                                                                                                                                                                         Per eval: Began a couple years ago, occasional. No MOI or change in activities recalled. Worsening over  past six months. Pt has had extensive work up with MRI, CTA, XR. Seen pain management and neurosurgery, referred from neurosurgery. States he was referred to neurology as well, has spoken with eye doctor about his vision issues. No n/t or UE pain.  States he hasn't yet had any treatment for it. States injection was recommended but denied by insurance. Tightness is more predictable but states vision/dizziness is not consistently easy to provoke.   SUBJECTIVE STATEMENT: 11/25/2023: a little sore after exercises. Has been doing exercises 2-3x/day, don't bother him during but do feel a bit sore afterwards. Overall feeling about the same.     PERTINENT HISTORY:  STEMI, PE/DVT hx, DM2, headache, CAD, OSA + CPAP, dizziness, ischemic colitis  PAIN:  Are you having pain: 5-6/10 middle of neck   Per eval:  Location/description: neck tightness, central/periscapular Best-worst over past week: 0-5/10  - aggravating factors: turning head, looking down, stooping forward, squatting, riding lawnmower - Easing factors: changing positioning, stretching  PRECAUTIONS: DVT/PE hx, cardiac hx  RED FLAGS: No fevers/chills, no nausea, no unintentional weight loss (on mounjaro ). Hx headaches.  Dizziness/vision issues since neck pain began, providers aware and significant work up performed. Describes vision issues as cloudiness and difficulty focusing vision. Difficulty describing clear/consistent provocative factors    WEIGHT BEARING RESTRICTIONS: No  FALLS:  Has patient fallen in last 6 months? No  LIVING ENVIRONMENT: 1 story home, no STE Lives w/ spouse who he states takes care of housework, pt does yard work  OCCUPATION: retired; used to do maintenance  PLOF: Independent  PATIENT  GOALS: eliminate pain  NEXT MD VISIT: telehealth in September, neurologist TBD  OBJECTIVE:  Note: Objective measures were completed at Evaluation unless otherwise noted.  DIAGNOSTIC FINDINGS:  CTA head/neck 09/20/23: IMPRESSION: 1. No evidence of acute intracranial abnormality. 2. Mild atherosclerosis in the head and neck without a large vessel occlusion, significant stenosis, or aneurysm. 3.  Aortic Atherosclerosis (ICD10-I70.0).  MRI C spine 08/12/23: IMPRESSION: 1. Chronic cervical spine degeneration most pronounced at C4-C5 and C5-C6. Faint degenerative endplate marrow edema at the latter. Mild degenerative spondylolisthesis at C7-T1. 2. No cervical spinal stenosis. Moderate to severe bilateral C6 neural foraminal stenosis, up to moderate at the right C5 and C7 nerve levels.  C spine XR flex/ext 06/22/23: IMPRESSION: 1. Grade 1 anterolisthesis of C3 on C4 in flexion which reduces upon extension. 2. Grade 1 anterolisthesis of C7 on T1 without dynamic listhesis. 3. Multilevel degenerative disc disease most pronounced at the C4-5 through C6-7 levels.  PATIENT SURVEYS:  NDI: 7/50  COGNITION: Overall cognitive status: Within functional limits for tasks assessed  SENSATION: LT intact BIL UE   POSTURE: FHP rounded shoulders  PALPATION: Concordant TTP BIL LS, UT, rhomboids   CERVICAL ROM:   ROM A/PROM (deg) eval AROM 11/25/23  Flexion 50 deg s   Extension 30 deg   Right lateral flexion 40 deg s   Left lateral flexion 18 deg s   Right rotation 35 deg s  42 deg premanual 51 deg post manual   Left rotation 45 deg s 58 deg premanual 55 deg post manual   (Blank rows = not tested) (Key: WFL = within functional limits not formally assessed, * = concordant pain, s = stiffness/stretching sensation, NT = not tested) Comments:   UPPER EXTREMITY ROM:  A/PROM Right eval Left eval  Shoulder flexion 153 deg s 151 deg s  Shoulder abduction    Shoulder internal rotation     Shoulder external rotation  Elbow flexion    Elbow extension    Wrist flexion    Wrist extension     (Blank rows = not tested) (Key: WFL = within functional limits not formally assessed, * = concordant pain, s = stiffness/stretching sensation, NT = not tested)  Comments:    UPPER EXTREMITY MMT:  MMT Right eval Left eval  Shoulder flexion 4+ 4+  Shoulder extension    Shoulder abduction 5 5  Shoulder extension 4+ 4+  Shoulder internal rotation    Shoulder external rotation    Elbow flexion 5 5  Elbow extension 4+ 4+  Grip strength 60# 60#  (Blank rows = not tested)  (Key: WFL = within functional limits not formally assessed, * = concordant pain, s = stiffness/stretching sensation, NT = not tested)  Comments:     TREATMENT DATE:  [facility] Adult PT Treatment:                                                DATE: 11/25/23 Therapeutic Exercise: Seated thoracic ext 2x8 in chair - emphasis on comfortable ROM and appropriate head/neck positioning  Swiss ball flexion up wall x12 cues for posture and head positioning HEP update + education/handout   Manual Therapy: Seated; light STM and trigger point release to BIL UT, LS, rhomboid   Neuromuscular re-ed: Seated shoulder rolls 2x10 BIL - performed each side independently w emphasis on motor control and reduced thoracic compensations Scap retraction x10 emphasis on reduced thoracic compensations Cradle position scap retraction w/ 10 sec iso hold x5, improved tolerance   [facility] Adult PT Treatment:                                                DATE: 11/19/23 Therapeutic Exercise: Scapular retraction x10 Shoulder rolls x10 each way HEP handout + education, rationale for interventions, relevant anatomy/physiology as pertains to exam/exercise                                                                                                                PATIENT EDUCATION:  Education details: rationale for interventions, HEP  Person  educated: Patient Education method: Explanation, Demonstration, Tactile cues, Verbal cues Education comprehension: verbalized understanding, returned demonstration, verbal cues required, tactile cues required, and needs further education     HOME EXERCISE PROGRAM: Access Code: [email or web address] URL: [email or web address] Date: 11/25/2023 Prepared by: Alm Jenny  Exercises - Seated Scapular Retraction  - 2-3 x daily - 1 sets - 10 reps - Seated Shoulder Rolls  - 2-3 x daily - 1 sets - 10 reps - Seated Thoracic Lumbar Extension  - 2-3 x daily - 1 sets - 8 reps - Shoulder Flexion Wall Slide with Towel  - 2-3 x daily - 1 sets -  8 reps  ASSESSMENT:  CLINICAL IMPRESSION: 11/25/2023: Pt arrives w/ report of soreness/stiffness w/ HEP but overall no significant changes since initial eval. Tolerates manual well overall, has subsequent improvement in ROM but limited change in pain. Following this with exercises emphasizing periscapular control as he demonstrates stiffness and compensations at thoracolumbar spine. He reports improved symptoms by end of session and tolerates activity well overall. No adverse events. Recommend continuing along current POC in order to address relevant deficits and improve functional tolerance. Pt departs today's session in no acute distress, all voiced questions/concerns addressed appropriately from PT perspective.    Per eval: Patient is a pleasant 73 y.o. gentleman who was seen today for physical therapy evaluation and treatment for neck pain/tightness. He does endorse some vision/dizziness issues which providers are aware of, extensive imaging work up as above (refer to [facility] for details). Overall red flag questioning is reassuring. On exam he demonstrates concordant limitations in cervical mobility, postural deficits, mild postural/GH weakness, and concordant TTP periscapular/cervical musculature. Tolerates exam/HEP well without adverse event or provocation of  dizziness/visual issues. Recommend trial of skilled PT to address aforementioned deficits with aim of improving functional tolerance and reducing pain with typical activities. Pt departs today's session in no acute distress, all voiced concerns/questions addressed appropriately from PT perspective.      OBJECTIVE IMPAIRMENTS: decreased activity tolerance, decreased endurance, decreased mobility, decreased ROM, decreased strength, impaired UE functional use, postural dysfunction, and pain.   ACTIVITY LIMITATIONS: lifting and bending  PARTICIPATION LIMITATIONS: community activity and yard work  PERSONAL FACTORS: Age, Time since onset of injury/illness/exacerbation, and 3+ comorbidities: STEMI, PE/DVT hx, DM2, headache, CAD, OSA + CPAP, dizziness, ischemic colitis are also affecting patient's functional outcome.   REHAB POTENTIAL: Good  CLINICAL DECISION MAKING: Evolving/moderate complexity  EVALUATION COMPLEXITY: Moderate   GOALS:   SHORT TERM GOALS: Target date: 12/10/2023  Pt will demonstrate appropriate understanding and performance of initially prescribed HEP in order to facilitate improved independence with management of symptoms.  Baseline: HEP established  Goal status: INITIAL   2. Pt will report at least 25% improvement in overall pain levels over past week in order to facilitate improved tolerance to typical daily activities.   Baseline: 0-5/10  Goal status: INITIAL    LONG TERM GOALS: Target date: 12/31/2023  Pt will score less than or equal to <3/50 on NDI in order to demonstrate improved perception of function due to symptoms (MDC 10-13 pts per Neysa dunker al 2009, 2010). Baseline: 7/50 Goal status: INITIAL  2. Pt will demonstrate at least 50 degrees of active cervical rotation ROM BIL in order to demonstrate improved environmental awareness and safety with driving.  Baseline: see ROM chart above Goal status: INITIAL  3. Pt will demonstrate MMT of 5/5 in tested groups for  improved symmetry of UE strength and improved tolerance to functional movements.  Baseline: see MMT chart above Goal status: INITIAL   4. Pt will report at least 50% decrease in overall pain levels in past week in order to facilitate improved tolerance to basic ADLs/mobility.   Baseline: 0-5/10  Goal status: INITIAL     PLAN:  PT FREQUENCY: 2x/week  PT DURATION: 6 weeks  PLANNED INTERVENTIONS: [id number]- PT Re-evaluation, 97750- Physical Performance Testing, 97110-Therapeutic exercises, 97530- Therapeutic activity, [id number]- Neuromuscular re-education, 97535- Self Care, [id number]- Manual therapy, 20560 (1-2 muscles), 20561 (3+ muscles)- Dry Needling, Patient/Family education, Taping, Joint mobilization, Spinal mobilization, Cryotherapy, and Moist heat  PLAN FOR NEXT SESSION: Review/update HEP PRN. Work on [employer]  exercises as appropriate with emphasis on postural strength/endurance. Symptom modification strategies as indicated/appropriate.    Alm DELENA Jenny PT, DPT 11/25/2023 3:30 PM

## 2023-11-26 ENCOUNTER — Ambulatory Visit

## 2023-11-26 DIAGNOSIS — M5030 Other cervical disc degeneration, unspecified cervical region: Secondary | ICD-10-CM | POA: Diagnosis not present

## 2023-11-26 DIAGNOSIS — R293 Abnormal posture: Secondary | ICD-10-CM | POA: Diagnosis not present

## 2023-11-26 DIAGNOSIS — M542 Cervicalgia: Secondary | ICD-10-CM | POA: Diagnosis not present

## 2023-11-26 DIAGNOSIS — M47812 Spondylosis without myelopathy or radiculopathy, cervical region: Secondary | ICD-10-CM | POA: Diagnosis not present

## 2023-11-26 NOTE — Therapy (Signed)
 OUTPATIENT PHYSICAL THERAPY TREATMENT   Patient Name: Alan Morrison MRN: [id number] DOB:11-11-1950, 73 y.o., male Today's Date: 11/26/2023  END OF SESSION:  PT End of Session - 11/26/23 0952     Visit Number 3    Number of Visits 13    Date for PT Re-Evaluation 12/31/23    Authorization Type [employer] medicare    Progress Note Due on Visit 10    PT Start Time 1000    PT Stop Time 1040    PT Time Calculation (min) 40 min    Activity Tolerance Patient tolerated treatment well    Behavior During Therapy WFL for tasks assessed/performed          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   by US    Arthritis Early 2000's   Bilateral pulmonary embolism (HCC) 02/05/2022   Cervical spondylosis without myelopathy    Choroidal nevus 01/22/2011   left, yearly eye exam, no diabetic retinopathy   COVID-19 virus infection 12/03/2022   Diabetes mellitus type II    DVT of deep femoral vein, left (HCC) 04/29/2018   LLE DVT from proximal femoral vein to popliteal vein into proximal calf (04/29/2018) started on xarelto  Heme (Magrinat) recommended continuing lifelong lower dose anticoagulant indefinitely (11/2018)   Dyspepsia    Ex-smoker    Fatty liver 02/2016   by US    GERD (gastroesophageal reflux disease)    Headache(784.0)    Heart murmur    Hx of   HLD (hyperlipidemia)    Hyperuricemia    Obesity    Sleep apnea    Stress-induced cardiomyopathy 09/27/1998   WNL, EF 64%   Urosepsis 01/01-01/05/10   Hospitalization   Past Surgical History:  Procedure Laterality Date   BIOPSY  10/10/2022   Procedure: BIOPSY;  Surgeon: Federico Rosario BROCKS, MD;  Location: [facility] ENDOSCOPY;  Service: Gastroenterology;;   CARDIAC CATHETERIZATION     COLONOSCOPY  07/2013   1 benign polyp rpt 10 yrs  [place])   COLONOSCOPY WITH PROPOFOL  N/A 10/10/2022   Procedure: COLONOSCOPY WITH PROPOFOL ;  Surgeon: Federico Rosario BROCKS, MD;  Location: [facility] ENDOSCOPY;  Service: Gastroenterology;  Laterality: N/A;   CORONARY THROMBECTOMY N/A 11/12/2019   Procedure: Coronary Thrombectomy;  Surgeon: Claudene Victory ORN, MD;  Location: [facility] INVASIVE CV LAB;  Service: Cardiovascular;  Laterality: N/A;   CORONARY ULTRASOUND/IVUS N/A 01/30/2022   Procedure: Intravascular Ultrasound/IVUS;  Surgeon: Gretta Lonni PARAS, MD;  Location: [facility] INVASIVE CV LAB;  Service: Cardiovascular;  Laterality: N/A;  Venous   CORONARY/GRAFT ACUTE MI REVASCULARIZATION N/A 11/12/2019   Procedure: Coronary/Graft Acute MI Revascularization;  Surgeon: Claudene Victory ORN, MD;  Location: [facility] INVASIVE CV LAB;  Service: Cardiovascular;  Laterality: N/A;   KNEE ARTHROSCOPY Left 1988   KNEE SURGERY Right 05/21/03   Right, Dr. Addie, med meniscus tear via MRI   LEFT HEART CATH AND CORONARY ANGIOGRAPHY N/A 04/01/2017   Procedure: LEFT HEART CATH AND CORONARY ANGIOGRAPHY;  Surgeon: Swaz[place], Peter M, MD;  Location: [facility] INVASIVE CV LAB;  Service: Cardiovascular;  Laterality: N/A;   LEFT HEART CATH AND CORONARY ANGIOGRAPHY N/A 11/12/2019   Procedure: LEFT HEART CATH AND CORONARY ANGIOGRAPHY;  Surgeon: Claudene Victory ORN, MD;  Location: [facility] INVASIVE CV LAB;  Service: Cardiovascular;  Laterality: N/A;   MYELOGRAM  08/05   bulging disc   PERIPHERAL VASCULAR BALLOON ANGIOPLASTY  01/30/2022   Procedure: PERIPHERAL VASCULAR BALLOON ANGIOPLASTY;  Surgeon: Gretta Lonni PARAS, MD;  Location: [facility] INVASIVE CV LAB;  Service: Cardiovascular;;   PERIPHERAL VASCULAR THROMBECTOMY Right 01/30/2022   Procedure: PERIPHERAL VASCULAR THROMBECTOMY;  Surgeon: Gretta Lonni PARAS, MD;  Location: [facility] INVASIVE CV LAB;  Service: Cardiovascular;  Laterality: Right;   Patient Active Problem List   Diagnosis Date Noted   Foraminal stenosis of cervical region 10/21/2023   Cervical radicular pain 10/21/2023    Medicare annual wellness visit, subsequent 06/22/2023   Degeneration of cervical intervertebral disc 06/22/2023   Hypoglycemia 01/13/2023   Carotid artery calcification, bilateral 11/02/2022   Dizziness 10/26/2022   Ischemic colitis (HCC) 10/08/2022   Adrenal adenoma, left 02/05/2022   Right lower lobe pulmonary nodule 02/05/2022   Acute deep vein thrombosis (DVT) of iliac vein of right lower extremity (HCC) 01/29/2022   Pain in both lower extremities 01/15/2022   History of recurrent deep vein thrombosis (DVT) 01/03/2022   Poliosis 10/14/2021   Advanced directives, counseling/discussion 02/02/2019   GERD (gastroesophageal reflux disease)    Cervical spondylosis without myelopathy    Anemia, iron  deficiency, inadequate dietary intake    Angina pectoris (HCC) 04/01/2017   Right hip pain 06/09/2016   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   Fatty liver 02/19/2016   Hyperuricemia    Ex-smoker    History of MRSA infection 12/19/2013   BPV (benign positional vertigo) 12/13/2013   Hyperlipidemia associated with type 2 diabetes mellitus (HCC)    Vitamin D  deficiency 05/01/2012   Body mass index (BMI) 25.0-25.9, adult 06/08/2011   Health maintenance examination 05/06/2011   Choroidal nevus 01/22/2011   Essential hypertension, benign 06/12/2010   TESTICULAR HYPOFUNCTION 03/11/2009   Benign prostatic hyperplasia with urinary obstruction 02/04/2009   OSA on CPAP 02/04/2009   Type 2 diabetes mellitus with chronic kidney disease, with long-term current use of insulin  (HCC) 10/18/2006   Coronary artery disease 12/12/2001   Stress-induced cardiomyopathy 09/27/1998    PCP: Rilla Baller, MD  REFERRING PROVIDER: Hilma Hastings, PA-C  REFERRING DIAG: [phone] (ICD-10-CM) - Cervical spondylosis M50.30 (ICD-10-CM) - DDD (degenerative disc disease), cervical  THERAPY DIAG:  Cervicalgia  Abnormal posture  Rationale for Evaluation and Treatment: Rehabilitation  ONSET DATE: a couple  years  SUBJECTIVE:  Per eval: Began a couple years ago, occasional. No MOI or change in activities recalled. Worsening over past six months. Pt has had extensive work up with MRI, CTA, XR. Seen pain management and neurosurgery, referred from neurosurgery. States he was referred to neurology as well, has spoken with eye doctor about his vision issues. No n/t or UE pain.  States he hasn't yet had any treatment for it. States injection was recommended but denied by insurance. Tightness is more predictable but states vision/dizziness is not consistently easy to provoke.   SUBJECTIVE STATEMENT: Patient reports that he is not having any pain this morning, felt ok after last session.   PERTINENT HISTORY:  STEMI, PE/DVT hx, DM2, headache, CAD, OSA + CPAP, dizziness, ischemic colitis  PAIN:  Are you having pain: 5-6/10 middle of neck   Per eval:  Location/description: neck tightness, central/periscapular Best-worst over past week: 0-5/10  - aggravating factors: turning head, looking down, stooping forward, squatting, riding lawnmower - Easing factors: changing positioning, stretching  PRECAUTIONS: DVT/PE hx, cardiac hx  RED FLAGS: No fevers/chills, no nausea, no unintentional weight loss (on mounjaro ). Hx headaches.  Dizziness/vision issues since neck pain began, providers aware and significant work up performed. Describes vision issues as cloudiness and difficulty focusing vision. Difficulty describing clear/consistent provocative factors   WEIGHT BEARING RESTRICTIONS: No  FALLS:  Has patient fallen in last 6 months? No  LIVING ENVIRONMENT: 1 story home, no STE Lives w/ spouse who he states takes care of housework, pt does yard work  OCCUPATION: retired; used to do maintenance  PLOF:  Independent  PATIENT GOALS: eliminate pain  NEXT MD VISIT: telehealth in September, neurologist TBD  OBJECTIVE:  Note: Objective measures were completed at Evaluation unless otherwise noted.  DIAGNOSTIC FINDINGS:  CTA head/neck 09/20/23: IMPRESSION: 1. No evidence of acute intracranial abnormality. 2. Mild atherosclerosis in the head and neck without a large vessel occlusion, significant stenosis, or aneurysm. 3.  Aortic Atherosclerosis (ICD10-I70.0).  MRI C spine 08/12/23: IMPRESSION: 1. Chronic cervical spine degeneration most pronounced at C4-C5 and C5-C6. Faint degenerative endplate marrow edema at the latter. Mild degenerative spondylolisthesis at C7-T1. 2. No cervical spinal stenosis. Moderate to severe bilateral C6 neural foraminal stenosis, up to moderate at the right C5 and C7 nerve levels.  C spine XR flex/ext 06/22/23: IMPRESSION: 1. Grade 1 anterolisthesis of C3 on C4 in flexion which reduces upon extension. 2. Grade 1 anterolisthesis of C7 on T1 without dynamic listhesis. 3. Multilevel degenerative disc disease most pronounced at the C4-5 through C6-7 levels.  PATIENT SURVEYS:  NDI: 7/50  COGNITION: Overall cognitive status: Within functional limits for tasks assessed  SENSATION: LT intact BIL UE   POSTURE: FHP rounded shoulders  PALPATION: Concordant TTP BIL LS, UT, rhomboids   CERVICAL ROM:   ROM A/PROM (deg) eval AROM 11/25/23  Flexion 50 deg s   Extension 30 deg   Right lateral flexion 40 deg s   Left lateral flexion 18 deg s   Right rotation 35 deg s  42 deg premanual 51 deg post manual   Left rotation 45 deg s 58 deg premanual 55 deg post manual   (Blank rows = not tested) (Key: WFL = within functional limits not formally assessed, * = concordant pain, s = stiffness/stretching sensation, NT = not tested) Comments:   UPPER EXTREMITY ROM:  A/PROM Right eval Left eval  Shoulder flexion 153 deg s 151 deg s  Shoulder abduction     Shoulder internal rotation  Shoulder external rotation    Elbow flexion    Elbow extension    Wrist flexion    Wrist extension     (Blank rows = not tested) (Key: WFL = within functional limits not formally assessed, * = concordant pain, s = stiffness/stretching sensation, NT = not tested)  Comments:    UPPER EXTREMITY MMT:  MMT Right eval Left eval  Shoulder flexion 4+ 4+  Shoulder extension    Shoulder abduction 5 5  Shoulder extension 4+ 4+  Shoulder internal rotation    Shoulder external rotation    Elbow flexion 5 5  Elbow extension 4+ 4+  Grip strength 60# 60#  (Blank rows = not tested)  (Key: WFL = within functional limits not formally assessed, * = concordant pain, s = stiffness/stretching sensation, NT = not tested)  Comments:     TREATMENT DATE:  [facility] Adult PT Treatment:                                                DATE: 11/26/23 Therapeutic Exercise: Seated thoracic ext 2x8 in chair - emphasis on comfortable ROM and appropriate head/neck positioning  Swiss ball flexion up wall 2x10 cues for posture and head positioning Manual Therapy: Seated; light STM and trigger point release to BIL UT, LS, rhomboid  Neuromuscular re-ed: Seated shoulder rolls 2x10 BIL - performed each side independently w emphasis on motor control and reduced thoracic compensations Scap retraction x10 emphasis on reduced thoracic compensations Cradle position scap retraction w/ 10 sec iso hold x5, improved tolerance Rows RTB 2x10 - cues for decreased trunk compensation   [facility] Adult PT Treatment:                                                DATE: 11/25/23 Therapeutic Exercise: Seated thoracic ext 2x8 in chair - emphasis on comfortable ROM and appropriate head/neck positioning  Swiss ball flexion up wall x12 cues for posture and head positioning HEP update + education/handout   Manual Therapy: Seated; light STM and trigger point release to BIL UT, LS, rhomboid   Neuromuscular  re-ed: Seated shoulder rolls 2x10 BIL - performed each side independently w emphasis on motor control and reduced thoracic compensations Scap retraction x10 emphasis on reduced thoracic compensations Cradle position scap retraction w/ 10 sec iso hold x5, improved tolerance   [facility] Adult PT Treatment:                                                DATE: 11/19/23 Therapeutic Exercise: Scapular retraction x10 Shoulder rolls x10 each way HEP handout + education, rationale for interventions, relevant anatomy/physiology as pertains to exam/exercise  PATIENT EDUCATION:  Education details: rationale for interventions, HEP  Person educated: Patient Education method: Explanation, Demonstration, Tactile cues, Verbal cues Education comprehension: verbalized understanding, returned demonstration, verbal cues required, tactile cues required, and needs further education     HOME EXERCISE PROGRAM: Access Code: [email or web address] URL: [email or web address] Date: 11/25/2023 Prepared by: Alm Jenny  Exercises - Seated Scapular Retraction  - 2-3 x daily - 1 sets - 10 reps - Seated Shoulder Rolls  - 2-3 x daily - 1 sets - 10 reps - Seated Thoracic Lumbar Extension  - 2-3 x daily - 1 sets - 8 reps - Shoulder Flexion Wall Slide with Towel  - 2-3 x daily - 1 sets - 8 reps  ASSESSMENT:  CLINICAL IMPRESSION: Patient presents to PT reporting no current pain, felt ok after last session. Session today continued to focus on periscapular strengthening and ROM with emphasis on decreasing thoracic compensations. Manual performed again to decrease tension and pain perceptions. Patient was able to tolerate all prescribed exercises with no adverse effects. Patient continues to benefit from skilled PT services and should be progressed as able to improve functional independence.   Per eval: Patient is a  pleasant 73 y.o. gentleman who was seen today for physical therapy evaluation and treatment for neck pain/tightness. He does endorse some vision/dizziness issues which providers are aware of, extensive imaging work up as above (refer to [facility] for details). Overall red flag questioning is reassuring. On exam he demonstrates concordant limitations in cervical mobility, postural deficits, mild postural/GH weakness, and concordant TTP periscapular/cervical musculature. Tolerates exam/HEP well without adverse event or provocation of dizziness/visual issues. Recommend trial of skilled PT to address aforementioned deficits with aim of improving functional tolerance and reducing pain with typical activities. Pt departs today's session in no acute distress, all voiced concerns/questions addressed appropriately from PT perspective.      OBJECTIVE IMPAIRMENTS: decreased activity tolerance, decreased endurance, decreased mobility, decreased ROM, decreased strength, impaired UE functional use, postural dysfunction, and pain.   ACTIVITY LIMITATIONS: lifting and bending  PARTICIPATION LIMITATIONS: community activity and yard work  PERSONAL FACTORS: Age, Time since onset of injury/illness/exacerbation, and 3+ comorbidities: STEMI, PE/DVT hx, DM2, headache, CAD, OSA + CPAP, dizziness, ischemic colitis are also affecting patient's functional outcome.   REHAB POTENTIAL: Good  CLINICAL DECISION MAKING: Evolving/moderate complexity  EVALUATION COMPLEXITY: Moderate   GOALS:   SHORT TERM GOALS: Target date: 12/10/2023  Pt will demonstrate appropriate understanding and performance of initially prescribed HEP in order to facilitate improved independence with management of symptoms.  Baseline: HEP established  Goal status: INITIAL   2. Pt will report at least 25% improvement in overall pain levels over past week in order to facilitate improved tolerance to typical daily activities.   Baseline: 0-5/10  Goal status:  INITIAL    LONG TERM GOALS: Target date: 12/31/2023  Pt will score less than or equal to <3/50 on NDI in order to demonstrate improved perception of function due to symptoms (MDC 10-13 pts per Neysa dunker al 2009, 2010). Baseline: 7/50 Goal status: INITIAL  2. Pt will demonstrate at least 50 degrees of active cervical rotation ROM BIL in order to demonstrate improved environmental awareness and safety with driving.  Baseline: see ROM chart above Goal status: INITIAL  3. Pt will demonstrate MMT of 5/5 in tested groups for improved symmetry of UE strength and improved tolerance to functional movements.  Baseline: see MMT chart above Goal status: INITIAL   4. Pt will report at least 50% decrease  in overall pain levels in past week in order to facilitate improved tolerance to basic ADLs/mobility.   Baseline: 0-5/10  Goal status: INITIAL     PLAN:  PT FREQUENCY: 2x/week  PT DURATION: 6 weeks  PLANNED INTERVENTIONS: [id number]- PT Re-evaluation, 97750- Physical Performance Testing, 97110-Therapeutic exercises, 97530- Therapeutic activity, [id number]- Neuromuscular re-education, 97535- Self Care, [id number]- Manual therapy, 20560 (1-2 muscles), 20561 (3+ muscles)- Dry Needling, Patient/Family education, Taping, Joint mobilization, Spinal mobilization, Cryotherapy, and Moist heat  PLAN FOR NEXT SESSION: Review/update HEP PRN. Work on [employer] exercises as appropriate with emphasis on postural strength/endurance. Symptom modification strategies as indicated/appropriate.    Corean Pouch PTA  11/26/2023 10:40 AM

## 2023-11-29 ENCOUNTER — Ambulatory Visit (HOSPITAL_BASED_OUTPATIENT_CLINIC_OR_DEPARTMENT_OTHER): Admitting: Adult Health

## 2023-11-29 ENCOUNTER — Encounter (HOSPITAL_BASED_OUTPATIENT_CLINIC_OR_DEPARTMENT_OTHER): Payer: Self-pay | Admitting: Adult Health

## 2023-11-29 VITALS — BP 145/77 | HR 90 | Ht 67.5 in | Wt 162.6 lb

## 2023-11-29 DIAGNOSIS — Z87891 Personal history of nicotine dependence: Secondary | ICD-10-CM

## 2023-11-29 DIAGNOSIS — G4733 Obstructive sleep apnea (adult) (pediatric): Secondary | ICD-10-CM | POA: Diagnosis not present

## 2023-11-29 DIAGNOSIS — Z9989 Dependence on other enabling machines and devices: Secondary | ICD-10-CM

## 2023-11-29 NOTE — Patient Instructions (Signed)
 CPAP mask fitting.  Change CPAP pressure 7-12cmH2O.(Sent to [facility])  CPAP download in 4 weeks  Continue on CPAP at bedtime, wear all night long.  Do not drive if sleepy.  Follow up in 4 months and As needed

## 2023-11-29 NOTE — Progress Notes (Signed)
 @Patient  ID: Alan Morrison, male    DOB: [birth year], 73 y.o.   MRN: [id number]  Chief Complaint  Patient presents with   Follow-up    OSA on CPAP    Referring provider: Rilla Baller, MD  HPI: 73 year old male seen for sleep consult Aug 26, 2023 to establish for sleep apnea Medical history significant for pulmonary embolism, diabetes, coronary artery disease, cardiomyopathy  TEST/EVENTS :   11/29/2023 Follow up : OSA Patient presents for a 32-month follow-up.  Patient was seen last visit to establish for sleep apnea.  He has longstanding sleep apnea for greater than 20 years.  Patient needed a new CPAP machine.  Unfortunately did not have previous sleep study results.  He required a home sleep study.  Home sleep study done on Sep 07, 2023 showed severe sleep apnea with AHI at 33/hour and SpO2 low at 81%.  New CPAP was ordered.  CPAP download shows excellent compliance with 100% usage daily average usage at 6.5 hours.  AHI 17.4/hour.  Daily average pressure at 14.2.  Feels as if the pressure is too high.  Currently using a fullface mask.  Download shows significant mask leaks.  No Known Allergies  Immunization History  Administered Date(s) Administered   Fluad Quad(high Dose 65+) 01/12/2022   Influenza Whole 01/22/2009   Influenza, High Dose Seasonal PF 12/27/2017, 12/20/2018, 01/09/2020, 01/15/2021, 01/06/2023   Influenza, Seasonal, Injecte, Preservative Fre 01/18/2014   Influenza,inj,Quad PF,6+ Mos 01/20/2015   Influenza-Unspecified 01/18/2013, 01/18/2014, 01/30/2016, 01/06/2017   PFIZER Comirnaty(Gray Top)Covid-19 Tri-Sucrose Vaccine 07/31/2020, 06/26/2022   PFIZER(Purple Top)SARS-COV-2 Vaccination 05/28/2019, 06/22/2019, 01/09/2020   PNEUMOCOCCAL CONJUGATE-20 02/03/2023   Pfizer Covid-19 Vaccine Bivalent Booster 78yrs & up 01/15/2021, 08/29/2021   Pfizer(Comirnaty)Fall Seasonal Vaccine 12 years and older 01/24/2022, 01/06/2023   Pneumococcal Conjugate-13 10/22/2016    Pneumococcal Polysaccharide-23 05/06/2011, 10/18/2017   Respiratory Syncytial Virus Vaccine,Recomb Aduvanted(Arexvy) 01/12/2022   Td 08/21/2004   Tdap 05/29/2014   Zoster Recombinant(Shingrix) 04/14/2017, 06/18/2017   Zoster, Live 08/23/2013    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   by US    Arthritis Early 2000's   Bilateral pulmonary embolism (HCC) 02/05/2022   Cervical spondylosis without myelopathy    Choroidal nevus 01/22/2011   left, yearly eye exam, no diabetic retinopathy   COVID-19 virus infection 12/03/2022   Diabetes mellitus type II    DVT of deep femoral vein, left (HCC) 04/29/2018   LLE DVT from proximal femoral vein to popliteal vein into proximal calf (04/29/2018) started on xarelto  Heme (Magrinat) recommended continuing lifelong lower dose anticoagulant indefinitely (11/2018)   Dyspepsia    Ex-smoker    Fatty liver 02/2016   by US    GERD (gastroesophageal reflux disease)    Headache(784.0)    Heart murmur    Hx of   HLD (hyperlipidemia)    Hyperuricemia    Obesity    Sleep apnea    Stress-induced cardiomyopathy 09/27/1998   WNL, EF 64%   Urosepsis 01/01-01/05/10   Hospitalization    Tobacco History: Social History   Tobacco Use  Smoking Status Former   Current packs/day: 0.00   Average packs/day: 0.5 packs/day for 2.0 years (1.0 ttl pk-yrs)   Types: Cigarettes, Pipe, Cigars   Quit date: 04/20/1974   Years since quitting: 49.6  Passive exposure: Never  Smokeless Tobacco Never  Tobacco Comments   quit over 20 years   Counseling given: Not Answered Tobacco comments: quit over 20 years   Outpatient Medications Prior to Visit  Medication Sig Dispense Refill   acetaminophen  (TYLENOL ) 500 MG tablet Take 1,000 mg by mouth 2 (two)  times daily as needed for headache (pain).     aspirin  EC 81 MG tablet Take 81 mg by mouth daily with supper. Swallow whole.     Calcium  Carbonate Antacid (TUMS PO) Take 1 tablet by mouth daily as needed (acid reflux/indigestion).     Cholecalciferol  (VITAMIN D ) 2000 units tablet Take 2,000 Units by mouth daily with breakfast.      finasteride  (PROSCAR ) 5 MG tablet Take 1 tablet (5 mg total) by mouth daily. 90 tablet 4   Insulin  Glargine (BASAGLAR  KWIKPEN) 100 UNIT/ML Inject 60 Units into the skin at bedtime.     Insulin  Pen Needle (B-D ULTRAFINE III SHORT PEN) 31G X 8 MM MISC 1 Device by Other route 4 (four) times daily. USE AS DIRECTED DAILY 120 each 11   Iron , Ferrous Sulfate , 325 (65 Fe) MG TABS Take 325 mg by mouth every other day.     losartan  (COZAAR ) 25 MG tablet TAKE 1 TABLET (25 MG TOTAL) BY MOUTH DAILY. 90 tablet 3   metFORMIN  (GLUCOPHAGE ) 1000 MG tablet Take 1 tablet (1,000 mg total) by mouth 2 (two) times daily with a meal.     metoprolol  tartrate (LOPRESSOR ) 25 MG tablet TAKE 1/2 TABLET TWICE A DAY BY MOUTH 90 tablet 3   Multiple Vitamin (MULTIVITAMIN WITH MINERALS) TABS tablet Take 1 tablet by mouth daily with breakfast.      nitroGLYCERIN  (NITROSTAT ) 0.4 MG SL tablet PLACE 1 TABLET UNDER THE TONGUE EVERY 5 MINUTES AS NEEDED FOR CHEST PAIN 25 tablet 11   ONETOUCH ULTRA test strip USE TO CHECK SUGARS DAILY  AS DIRECTED 100 strip 3   probenecid  (BENEMID ) 500 MG tablet Take 0.5 tablets (250 mg total) by mouth every other day. 45 tablet 4   rivaroxaban  (XARELTO ) 10 MG TABS tablet Take 1 tablet (10 mg total) by mouth daily. 90 tablet 1   rosuvastatin  (CRESTOR ) 40 MG tablet Take 1 tablet (40 mg total) by mouth daily with supper. 90 tablet 3   tamsulosin  (FLOMAX ) 0.4 MG CAPS capsule Take 1 capsule (0.4 mg total) by mouth daily. 90 capsule 4   tirzepatide  (MOUNJARO ) 12.5 MG/0.5ML Pen Inject 12.5 mg into the skin once a week.     No facility-administered medications prior to visit.      Review of Systems:   Constitutional:   No  weight loss, night sweats,  Fevers, chills, fatigue, or  lassitude.  HEENT:   No headaches,  Difficulty swallowing,  Tooth/dental problems, or  Sore throat,                No sneezing, itching, ear ache, +nasal congestion, post nasal drip,   CV:  No chest pain,  Orthopnea, PND, swelling in lower extremities, anasarca, dizziness, palpitations, syncope.   GI  No heartburn, indigestion, abdominal pain, nausea, vomiting, diarrhea, change in bowel habits, loss of appetite, bloody stools.   Resp: No shortness of breath with exertion or at rest.  No excess mucus, no productive cough,  No non-productive cough,  No coughing up of blood.  No change in color of mucus.  No wheezing.  No chest wall deformity  Skin: no rash or lesions.  GU: no  dysuria, change in color of urine, no urgency or frequency.  No flank pain, no hematuria   MS:  No joint pain or swelling.  No decreased range of motion.  No back pain.    Physical Exam  BP (!) 145/77   Pulse 90   Ht 5' 7.5 (1.715 m)   Wt 162 lb 9.6 oz (73.8 kg)   SpO2 97%   BMI 25.09 kg/m   GEN: A/Ox3; pleasant , NAD, well nourished    HEENT:  [place]/AT,  EACs-clear, TMs-wnl, NOSE-clear, THROAT-clear, no lesions, no postnasal drip or exudate noted.   NECK:  Supple w/ fair ROM; no JVD; normal carotid impulses w/o bruits; no thyromegaly or nodules palpated; no lymphadenopathy.    RESP  Clear  P & A; w/o, wheezes/ rales/ or rhonchi. no accessory muscle use, no dullness to percussion  CARD:  RRR, no m/r/g, no peripheral edema, pulses intact, no cyanosis or clubbing.  GI:   Soft & nt; nml bowel sounds; no organomegaly or masses detected.   Musco: Warm bil, no deformities or joint swelling noted.   Neuro: alert, no focal deficits noted.    Skin: Warm, no lesions or rashes    Lab Results:   BNP   ProBNP No results found for: PROBNP  Imaging: No results found.  Administration History      None           No data to display          No results found for: NITRICOXIDE      Assessment & Plan:   OSA on CPAP Excellent compliance on CPAP.  Patient has residual events.  Will adjust CPAP pressure for comfort and hopefully to decrease number of sleep apneic events.  Mask fitting to help with mask leaks.  If unable to decrease number events consider CPAP titration study  Plan  Patient Instructions  CPAP mask fitting.  Change CPAP pressure 7-12cmH2O.(Sent to [facility])  CPAP download in 4 weeks  Continue on CPAP at bedtime, wear all night long.  Do not drive if sleepy.  Follow up in 4 months and As needed        Madelin Stank, NP 11/29/2023

## 2023-11-29 NOTE — Assessment & Plan Note (Signed)
 Excellent compliance on CPAP.  Patient has residual events.  Will adjust CPAP pressure for comfort and hopefully to decrease number of sleep apneic events.  Mask fitting to help with mask leaks.  If unable to decrease number events consider CPAP titration study  Plan  Patient Instructions  CPAP mask fitting.  Change CPAP pressure 7-12cmH2O.(Sent to [facility])  CPAP download in 4 weeks  Continue on CPAP at bedtime, wear all night long.  Do not drive if sleepy.  Follow up in 4 months and As needed

## 2023-12-01 ENCOUNTER — Ambulatory Visit: Admitting: Physical Therapy

## 2023-12-01 ENCOUNTER — Encounter: Payer: Self-pay | Admitting: Physical Therapy

## 2023-12-01 ENCOUNTER — Ambulatory Visit: Admitting: Adult Health

## 2023-12-01 ENCOUNTER — Encounter: Payer: Self-pay | Admitting: Orthopedic Surgery

## 2023-12-01 DIAGNOSIS — R293 Abnormal posture: Secondary | ICD-10-CM

## 2023-12-01 DIAGNOSIS — M542 Cervicalgia: Secondary | ICD-10-CM

## 2023-12-01 DIAGNOSIS — M5030 Other cervical disc degeneration, unspecified cervical region: Secondary | ICD-10-CM | POA: Diagnosis not present

## 2023-12-01 DIAGNOSIS — M47812 Spondylosis without myelopathy or radiculopathy, cervical region: Secondary | ICD-10-CM | POA: Diagnosis not present

## 2023-12-01 NOTE — Therapy (Signed)
 OUTPATIENT PHYSICAL THERAPY TREATMENT   Patient Name: Alan Morrison MRN: [id number] DOB:[birth year], 73 y.o., male Today's Date: 12/01/2023  END OF SESSION:  PT End of Session - 12/01/23 0846     Visit Number 4    Number of Visits 13    Date for PT Re-Evaluation 12/31/23    Authorization Type aetna medicare    Progress Note Due on Visit 10    PT Start Time 0846    PT Stop Time 0925    PT Time Calculation (min) 39 min    Activity Tolerance Patient tolerated treatment well    Behavior During Therapy [facility] for tasks assessed/performed           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   by US    Arthritis Early 2000's   Bilateral pulmonary embolism (HCC) 02/05/2022   Cervical spondylosis without myelopathy    Choroidal nevus 01/22/2011   left, yearly eye exam, no diabetic retinopathy   COVID-19 virus infection 12/03/2022   Diabetes mellitus type II    DVT of deep femoral vein, left (HCC) 04/29/2018   LLE DVT from proximal femoral vein to popliteal vein into proximal calf (04/29/2018) started on xarelto  Heme (Magrinat) recommended continuing lifelong lower dose anticoagulant indefinitely (11/2018)   Dyspepsia    Ex-smoker    Fatty liver 02/2016   by US    GERD (gastroesophageal reflux disease)    Headache(784.0)    Heart murmur    Hx of   HLD (hyperlipidemia)    Hyperuricemia    Obesity    Sleep apnea    Stress-induced cardiomyopathy 09/27/1998   WNL, EF 64%   Urosepsis 01/01-01/05/10   Hospitalization   Past Surgical History:  Procedure Laterality Date   BIOPSY  10/10/2022   Procedure: BIOPSY;  Surgeon: Federico Rosario BROCKS, MD;  Location: [facility] ENDOSCOPY;  Service: Gastroenterology;;   CARDIAC CATHETERIZATION     COLONOSCOPY  07/2013   1 benign polyp rpt 10  yrs [place])   COLONOSCOPY WITH PROPOFOL  N/A 10/10/2022   Procedure: COLONOSCOPY WITH PROPOFOL ;  Surgeon: Federico Rosario BROCKS, MD;  Location: [facility] ENDOSCOPY;  Service: Gastroenterology;  Laterality: N/A;   CORONARY THROMBECTOMY N/A 11/12/2019   Procedure: Coronary Thrombectomy;  Surgeon: Claudene Victory ORN, MD;  Location: [facility] INVASIVE CV LAB;  Service: Cardiovascular;  Laterality: N/A;   CORONARY ULTRASOUND/IVUS N/A 01/30/2022   Procedure: Intravascular Ultrasound/IVUS;  Surgeon: Gretta Lonni PARAS, MD;  Location: [facility] INVASIVE CV LAB;  Service: Cardiovascular;  Laterality: N/A;  Venous   CORONARY/GRAFT ACUTE MI REVASCULARIZATION N/A 11/12/2019   Procedure: Coronary/Graft Acute MI Revascularization;  Surgeon: Claudene Victory ORN, MD;  Location: [facility] INVASIVE CV LAB;  Service: Cardiovascular;  Laterality: N/A;   KNEE ARTHROSCOPY Left 1988   KNEE SURGERY Right 05/21/03   Right, Dr. Addie, med meniscus tear via MRI   LEFT HEART CATH AND CORONARY ANGIOGRAPHY N/A 04/01/2017   Procedure: LEFT HEART CATH AND CORONARY ANGIOGRAPHY;  Surgeon: [place], Peter M, MD;  Location: [facility] INVASIVE CV LAB;  Service: Cardiovascular;  Laterality: N/A;   LEFT HEART CATH AND CORONARY ANGIOGRAPHY N/A 11/12/2019   Procedure: LEFT HEART CATH AND CORONARY ANGIOGRAPHY;  Surgeon: Claudene Victory ORN, MD;  Location: [facility] INVASIVE CV LAB;  Service: Cardiovascular;  Laterality: N/A;  MYELOGRAM  08/05   bulging disc   PERIPHERAL VASCULAR BALLOON ANGIOPLASTY  01/30/2022   Procedure: PERIPHERAL VASCULAR BALLOON ANGIOPLASTY;  Surgeon: Gretta Lonni PARAS, MD;  Location: [facility] INVASIVE CV LAB;  Service: Cardiovascular;;   PERIPHERAL VASCULAR THROMBECTOMY Right 01/30/2022   Procedure: PERIPHERAL VASCULAR THROMBECTOMY;  Surgeon: Gretta Lonni PARAS, MD;  Location: [facility] INVASIVE CV LAB;  Service: Cardiovascular;  Laterality: Right;   Patient Active Problem List   Diagnosis Date Noted   Foraminal stenosis of cervical region 10/21/2023   Cervical radicular pain  10/21/2023   Medicare annual wellness visit, subsequent 06/22/2023   Degeneration of cervical intervertebral disc 06/22/2023   Hypoglycemia 01/13/2023   Carotid artery calcification, bilateral 11/02/2022   Dizziness 10/26/2022   Ischemic colitis (HCC) 10/08/2022   Adrenal adenoma, left 02/05/2022   Right lower lobe pulmonary nodule 02/05/2022   Acute deep vein thrombosis (DVT) of iliac vein of right lower extremity (HCC) 01/29/2022   Pain in both lower extremities 01/15/2022   History of recurrent deep vein thrombosis (DVT) 01/03/2022   Poliosis 10/14/2021   Advanced directives, counseling/discussion 02/02/2019   GERD (gastroesophageal reflux disease)    Cervical spondylosis without myelopathy    Anemia, iron  deficiency, inadequate dietary intake    Angina pectoris (HCC) 04/01/2017   Right hip pain 06/09/2016   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   Fatty liver 02/19/2016   Hyperuricemia    Ex-smoker    History of MRSA infection 12/19/2013   BPV (benign positional vertigo) 12/13/2013   Hyperlipidemia associated with type 2 diabetes mellitus (HCC)    Vitamin D  deficiency 05/01/2012   Body mass index (BMI) 25.0-25.9, adult 06/08/2011   Health maintenance examination 05/06/2011   Choroidal nevus 01/22/2011   Essential hypertension, benign 06/12/2010   TESTICULAR HYPOFUNCTION 03/11/2009   Benign prostatic hyperplasia with urinary obstruction 02/04/2009   OSA on CPAP 02/04/2009   Type 2 diabetes mellitus with chronic kidney disease, with long-term current use of insulin  (HCC) 10/18/2006   Coronary artery disease 12/12/2001   Stress-induced cardiomyopathy 09/27/1998    PCP: Rilla Baller, MD  REFERRING PROVIDER: Hilma Hastings, PA-C  REFERRING DIAG: [phone] (ICD-10-CM) - Cervical spondylosis M50.30 (ICD-10-CM) - DDD (degenerative disc disease), cervical  THERAPY DIAG:  Cervicalgia  Abnormal posture  Rationale for Evaluation and Treatment: Rehabilitation  ONSET  DATE: a couple years  SUBJECTIVE:  Per eval: Began a couple years ago, occasional. No MOI or change in activities recalled. Worsening over past six months. Pt has had extensive work up with MRI, CTA, XR. Seen pain management and neurosurgery, referred from neurosurgery. States he was referred to neurology as well, has spoken with eye doctor about his vision issues. No n/t or UE pain.  States he hasn't yet had any treatment for it. States injection was recommended but denied by insurance. Tightness is more predictable but states vision/dizziness is not consistently easy to provoke.   SUBJECTIVE STATEMENT: Endorses no pain currently. Still gets pain with neck movement  PERTINENT HISTORY:  STEMI, PE/DVT hx, DM2, headache, CAD, OSA + CPAP, dizziness, ischemic colitis  PAIN:  Are you having pain: 0/10 middle of neck   Per eval:  Location/description: neck tightness, central/periscapular Best-worst over past week: 0-5/10  - aggravating factors: turning head, looking down, stooping forward, squatting, riding lawnmower - Easing factors: changing positioning, stretching  PRECAUTIONS: DVT/PE hx, cardiac hx  RED FLAGS: No fevers/chills, no nausea, no unintentional weight loss (on mounjaro ). Hx headaches.  Dizziness/vision issues since neck pain began, providers aware and significant work up performed. Describes vision issues as cloudiness and difficulty focusing vision. Difficulty describing clear/consistent provocative factors   WEIGHT BEARING RESTRICTIONS: No  FALLS:  Has patient fallen in last 6 months? No  LIVING ENVIRONMENT: 1 story home, no STE Lives w/ spouse who he states takes care of housework, pt does yard work  OCCUPATION: retired; used to do maintenance  PLOF:  Independent  PATIENT GOALS: eliminate pain  NEXT MD VISIT: telehealth in September, neurologist TBD  OBJECTIVE:  Note: Objective measures were completed at Evaluation unless otherwise noted.  DIAGNOSTIC FINDINGS:  CTA head/neck 09/20/23: IMPRESSION: 1. No evidence of acute intracranial abnormality. 2. Mild atherosclerosis in the head and neck without a large vessel occlusion, significant stenosis, or aneurysm. 3.  Aortic Atherosclerosis (ICD10-I70.0).  MRI C spine 08/12/23: IMPRESSION: 1. Chronic cervical spine degeneration most pronounced at C4-C5 and C5-C6. Faint degenerative endplate marrow edema at the latter. Mild degenerative spondylolisthesis at C7-T1. 2. No cervical spinal stenosis. Moderate to severe bilateral C6 neural foraminal stenosis, up to moderate at the right C5 and C7 nerve levels.  C spine XR flex/ext 06/22/23: IMPRESSION: 1. Grade 1 anterolisthesis of C3 on C4 in flexion which reduces upon extension. 2. Grade 1 anterolisthesis of C7 on T1 without dynamic listhesis. 3. Multilevel degenerative disc disease most pronounced at the C4-5 through C6-7 levels.  PATIENT SURVEYS:  NDI: 7/50  COGNITION: Overall cognitive status: Within functional limits for tasks assessed  SENSATION: LT intact BIL UE   POSTURE: FHP rounded shoulders  PALPATION: Concordant TTP BIL LS, UT, rhomboids   CERVICAL ROM:   ROM A/PROM (deg) eval AROM 11/25/23  Flexion 50 deg s   Extension 30 deg   Right lateral flexion 40 deg s   Left lateral flexion 18 deg s   Right rotation 35 deg s  42 deg premanual 51 deg post manual   Left rotation 45 deg s 58 deg premanual 55 deg post manual   (Blank rows = not tested) (Key: WFL = within functional limits not formally assessed, * = concordant pain, s = stiffness/stretching sensation, NT = not tested) Comments:   UPPER EXTREMITY ROM:  A/PROM Right eval Left eval  Shoulder flexion 153 deg s 151 deg s  Shoulder abduction     Shoulder internal rotation    Shoulder external rotation    Elbow  flexion    Elbow extension    Wrist flexion    Wrist extension     (Blank rows = not tested) (Key: WFL = within functional limits not formally assessed, * = concordant pain, s = stiffness/stretching sensation, NT = not tested)  Comments:    UPPER EXTREMITY MMT:  MMT Right eval Left eval  Shoulder flexion 4+ 4+  Shoulder extension    Shoulder abduction 5 5  Shoulder extension 4+ 4+  Shoulder internal rotation    Shoulder external rotation    Elbow flexion 5 5  Elbow extension 4+ 4+  Grip strength 60# 60#  (Blank rows = not tested)  (Key: WFL = within functional limits not formally assessed, * = concordant pain, s = stiffness/stretching sensation, NT = not tested)  Comments:     TREATMENT DATE:  [facility] Adult PT Treatment:                                                DATE: 12/01/23 Therapeutic Exercise: Seated thoracic ext x10 in chair - emphasis on comfortable ROM and appropriate head/neck positioning  Cervical ext with pillowcase 5x5 Cervical rotation with pillowcase assist 5x5 Cervical lateral flexion with pillowcase assist x30 UT stretch x 30 Levator scap stretch x 30 Manual Therapy: Seated; STM and trigger point release to BIL UT, LS, cervical paraspinals Neuromuscular re-ed: Scap retraction 2x10 emphasis on reduced thoracic compensations Cervical retraction 10x3 Cervical retraction + rotation iso into hand 10x3 R&L Shoulder flexion AAROM with dowel seated x10 Shoulder ER RTB 2x10  Shoulder row RTB 2x10 Shoulder ext RTB 2x10 (one at a time to reduce cervical compensations)  [facility] Adult PT Treatment:                                                DATE: 11/26/23 Therapeutic Exercise: Seated thoracic ext 2x8 in chair - emphasis on comfortable ROM and appropriate head/neck positioning  Swiss ball flexion up wall 2x10 cues for posture and head positioning Manual Therapy: Seated; light STM and  trigger point release to BIL UT, LS, rhomboid  Neuromuscular re-ed: Seated shoulder rolls 2x10 BIL - performed each side independently w emphasis on motor control and reduced thoracic compensations Scap retraction x10 emphasis on reduced thoracic compensations Cradle position scap retraction w/ 10 sec iso hold x5, improved tolerance Rows RTB 2x10 - cues for decreased trunk compensation   [facility] Adult PT Treatment:                                                DATE: 11/25/23 Therapeutic Exercise: Seated thoracic ext 2x8 in chair - emphasis on comfortable ROM and appropriate head/neck positioning  Swiss ball flexion up wall x12 cues for posture and head positioning HEP update + education/handout   Manual Therapy: Seated; light STM and trigger point release to BIL UT, LS, rhomboid   Neuromuscular re-ed: Seated shoulder rolls 2x10 BIL - performed each side independently w emphasis on motor control and reduced thoracic compensations Scap retraction x10 emphasis on reduced thoracic compensations Cradle position scap retraction w/ 10 sec iso  hold x5, improved tolerance   [facility] Adult PT Treatment:                                                DATE: 11/19/23 Therapeutic Exercise: Scapular retraction x10 Shoulder rolls x10 each way HEP handout + education, rationale for interventions, relevant anatomy/physiology as pertains to exam/exercise                                                                                                               PATIENT EDUCATION:  Education details: rationale for interventions, HEP  Person educated: Patient Education method: Explanation, Demonstration, Tactile cues, Verbal cues Education comprehension: verbalized understanding, returned demonstration, verbal cues required, tactile cues required, and needs further education     HOME EXERCISE PROGRAM: Access Code: [email or web address] URL: [email or web address] Date: 11/25/2023 Prepared by: Alm Jenny  Exercises - Seated Scapular Retraction  - 2-3 x daily - 1 sets - 10 reps - Seated Shoulder Rolls  - 2-3 x daily - 1 sets - 10 reps - Seated Thoracic Lumbar Extension  - 2-3 x daily - 1 sets - 8 reps - Shoulder Flexion Wall Slide with Towel  - 2-3 x daily - 1 sets - 8 reps  ASSESSMENT:  CLINICAL IMPRESSION: Treatment focused on postural strengthening and deep neck muscle activation and strengthening. Cues to keep neck from tightening when performing midback strengthening exercises. Provided pt stretches for his neck that he can perform at home to help with his tightness. Tolerated well.   Per eval: Patient is a pleasant 73 y.o. gentleman who was seen today for physical therapy evaluation and treatment for neck pain/tightness. He does endorse some vision/dizziness issues which providers are aware of, extensive imaging work up as above (refer to [facility] for details). Overall red flag questioning is reassuring. On exam he demonstrates concordant limitations in cervical mobility, postural deficits, mild postural/GH weakness, and concordant TTP periscapular/cervical musculature. Tolerates exam/HEP well without adverse event or provocation of dizziness/visual issues. Recommend trial of skilled PT to address aforementioned deficits with aim of improving functional tolerance and reducing pain with typical activities. Pt departs today's session in no acute distress, all voiced concerns/questions addressed appropriately from PT perspective.      OBJECTIVE IMPAIRMENTS: decreased activity tolerance, decreased endurance, decreased mobility, decreased ROM, decreased strength, impaired UE functional use, postural dysfunction, and pain.   ACTIVITY LIMITATIONS: lifting and bending  PARTICIPATION LIMITATIONS: community activity and yard work  PERSONAL FACTORS: Age, Time since onset of injury/illness/exacerbation, and 3+ comorbidities: STEMI, PE/DVT hx, DM2, headache, CAD, OSA + CPAP, dizziness, ischemic  colitis are also affecting patient's functional outcome.   REHAB POTENTIAL: Good  CLINICAL DECISION MAKING: Evolving/moderate complexity  EVALUATION COMPLEXITY: Moderate   GOALS:   SHORT TERM GOALS: Target date: 12/10/2023  Pt will demonstrate appropriate understanding and performance of initially prescribed HEP in order to facilitate improved independence with management of symptoms.  Baseline: HEP established  Goal status: INITIAL   2. Pt will report at least 25% improvement in overall pain levels over past week in order to facilitate improved tolerance to typical daily activities.   Baseline: 0-5/10  Goal status: INITIAL    LONG TERM GOALS: Target date: 12/31/2023  Pt will score less than or equal to <3/50 on NDI in order to demonstrate improved perception of function due to symptoms (MDC 10-13 pts per Neysa dunker al 2009, 2010). Baseline: 7/50 Goal status: INITIAL  2. Pt will demonstrate at least 50 degrees of active cervical rotation ROM BIL in order to demonstrate improved environmental awareness and safety with driving.  Baseline: see ROM chart above Goal status: INITIAL  3. Pt will demonstrate MMT of 5/5 in tested groups for improved symmetry of UE strength and improved tolerance to functional movements.  Baseline: see MMT chart above Goal status: INITIAL   4. Pt will report at least 50% decrease in overall pain levels in past week in order to facilitate improved tolerance to basic ADLs/mobility.   Baseline: 0-5/10  Goal status: INITIAL     PLAN:  PT FREQUENCY: 2x/week  PT DURATION: 6 weeks  PLANNED INTERVENTIONS: 97164- PT Re-evaluation, 97750- Physical Performance Testing, 97110-Therapeutic exercises, 97530- Therapeutic activity, [id number]- Neuromuscular re-education, 97535- Self Care, [id number]- Manual therapy, 20560 (1-2 muscles), 20561 (3+ muscles)- Dry Needling, Patient/Family education, Taping, Joint mobilization, Spinal mobilization, Cryotherapy, and Moist  heat  PLAN FOR NEXT SESSION: Review/update HEP PRN. Work on [employer] exercises as appropriate with emphasis on postural strength/endurance. Symptom modification strategies as indicated/appropriate.    Matheus Spiker April Ma L Dreydon Cardenas PT, DPT 12/01/2023 8:46 AM

## 2023-12-01 NOTE — Telephone Encounter (Signed)
 Referral placed to [facility] neurology for mild atherosclerosis in the head and neck.   Message back stating:  Without stroke like symptoms, this is just managed by PCP addressing all CV risk factors. This is not something we typically have to see.   Reviewed with Dr. Claudene and will refer back to PCP. Patient sent message and advised.

## 2023-12-07 ENCOUNTER — Ambulatory Visit

## 2023-12-07 DIAGNOSIS — R293 Abnormal posture: Secondary | ICD-10-CM

## 2023-12-07 DIAGNOSIS — M542 Cervicalgia: Secondary | ICD-10-CM | POA: Diagnosis not present

## 2023-12-07 DIAGNOSIS — M5030 Other cervical disc degeneration, unspecified cervical region: Secondary | ICD-10-CM | POA: Diagnosis not present

## 2023-12-07 DIAGNOSIS — M47812 Spondylosis without myelopathy or radiculopathy, cervical region: Secondary | ICD-10-CM | POA: Diagnosis not present

## 2023-12-07 NOTE — Therapy (Signed)
 OUTPATIENT PHYSICAL THERAPY TREATMENT   Patient Name: Alan Morrison MRN: [id number] DOB:[birth year], 72 y.o., male Today's Date: 12/07/2023  END OF SESSION:  PT End of Session - 12/07/23 0956     Visit Number 5    Number of Visits 13    Date for PT Re-Evaluation 12/31/23    Authorization Type aetna medicare    PT Start Time 0920    PT Stop Time 0959    PT Time Calculation (min) 39 min    Activity Tolerance Patient tolerated treatment well    Behavior During Therapy [facility] for tasks assessed/performed            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   by US    Arthritis Early 2000's   Bilateral pulmonary embolism (HCC) 02/05/2022   Cervical spondylosis without myelopathy    Choroidal nevus 01/22/2011   left, yearly eye exam, no diabetic retinopathy   COVID-19 virus infection 12/03/2022   Diabetes mellitus type II    DVT of deep femoral vein, left (HCC) 04/29/2018   LLE DVT from proximal femoral vein to popliteal vein into proximal calf (04/29/2018) started on xarelto  Heme (Magrinat) recommended continuing lifelong lower dose anticoagulant indefinitely (11/2018)   Dyspepsia    Ex-smoker    Fatty liver 02/2016   by US    GERD (gastroesophageal reflux disease)    Headache(784.0)    Heart murmur    Hx of   HLD (hyperlipidemia)    Hyperuricemia    Obesity    Sleep apnea    Stress-induced cardiomyopathy 09/27/1998   WNL, EF 64%   Urosepsis 01/01-01/05/10   Hospitalization   Past Surgical History:  Procedure Laterality Date   BIOPSY  10/10/2022   Procedure: BIOPSY;  Surgeon: Federico Rosario BROCKS, MD;  Location: [facility] ENDOSCOPY;  Service: Gastroenterology;;   CARDIAC CATHETERIZATION     COLONOSCOPY  07/2013   1 benign polyp rpt 10 yrs Verda)   COLONOSCOPY WITH  PROPOFOL  N/A 10/10/2022   Procedure: COLONOSCOPY WITH PROPOFOL ;  Surgeon: Federico Rosario BROCKS, MD;  Location: [facility] ENDOSCOPY;  Service: Gastroenterology;  Laterality: N/A;   CORONARY THROMBECTOMY N/A 11/12/2019   Procedure: Coronary Thrombectomy;  Surgeon: Claudene Victory ORN, MD;  Location: [facility] INVASIVE CV LAB;  Service: Cardiovascular;  Laterality: N/A;   CORONARY ULTRASOUND/IVUS N/A 01/30/2022   Procedure: Intravascular Ultrasound/IVUS;  Surgeon: Gretta Lonni PARAS, MD;  Location: [facility] INVASIVE CV LAB;  Service: Cardiovascular;  Laterality: N/A;  Venous   CORONARY/GRAFT ACUTE MI REVASCULARIZATION N/A 11/12/2019   Procedure: Coronary/Graft Acute MI Revascularization;  Surgeon: Claudene Victory ORN, MD;  Location: [facility] INVASIVE CV LAB;  Service: Cardiovascular;  Laterality: N/A;   KNEE ARTHROSCOPY Left 1988   KNEE SURGERY Right 05/21/03   Right, Dr. Addie, med meniscus tear via MRI   LEFT HEART CATH AND CORONARY ANGIOGRAPHY N/A 04/01/2017   Procedure: LEFT HEART CATH AND CORONARY ANGIOGRAPHY;  Surgeon: [place]az[place], Peter M, MD;  Location: [facility] INVASIVE CV LAB;  Service: Cardiovascular;  Laterality: N/A;   LEFT HEART CATH AND CORONARY ANGIOGRAPHY N/A 11/12/2019   Procedure: LEFT HEART CATH AND CORONARY ANGIOGRAPHY;  Surgeon: Claudene Victory ORN, MD;  Location: [facility] INVASIVE CV LAB;  Service: Cardiovascular;  Laterality: N/A;   MYELOGRAM  08/05   bulging disc  PERIPHERAL VASCULAR BALLOON ANGIOPLASTY  01/30/2022   Procedure: PERIPHERAL VASCULAR BALLOON ANGIOPLASTY;  Surgeon: Gretta Lonni PARAS, MD;  Location: [facility] INVASIVE CV LAB;  Service: Cardiovascular;;   PERIPHERAL VASCULAR THROMBECTOMY Right 01/30/2022   Procedure: PERIPHERAL VASCULAR THROMBECTOMY;  Surgeon: Gretta Lonni PARAS, MD;  Location: [facility] INVASIVE CV LAB;  Service: Cardiovascular;  Laterality: Right;   Patient Active Problem List   Diagnosis Date Noted   Foraminal stenosis of cervical region 10/21/2023   Cervical radicular pain 10/21/2023   Medicare annual wellness  visit, subsequent 06/22/2023   Degeneration of cervical intervertebral disc 06/22/2023   Hypoglycemia 01/13/2023   Carotid artery calcification, bilateral 11/02/2022   Dizziness 10/26/2022   Ischemic colitis (HCC) 10/08/2022   Adrenal adenoma, left 02/05/2022   Right lower lobe pulmonary nodule 02/05/2022   Acute deep vein thrombosis (DVT) of iliac vein of right lower extremity (HCC) 01/29/2022   Pain in both lower extremities 01/15/2022   History of recurrent deep vein thrombosis (DVT) 01/03/2022   Poliosis 10/14/2021   Advanced directives, counseling/discussion 02/02/2019   GERD (gastroesophageal reflux disease)    Cervical spondylosis without myelopathy    Anemia, iron  deficiency, inadequate dietary intake    Angina pectoris (HCC) 04/01/2017   Right hip pain 06/09/2016   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   Fatty liver 02/19/2016   Hyperuricemia    Ex-smoker    History of MRSA infection 12/19/2013   BPV (benign positional vertigo) 12/13/2013   Hyperlipidemia associated with type 2 diabetes mellitus (HCC)    Vitamin D  deficiency 05/01/2012   Body mass index (BMI) 25.0-25.9, adult 06/08/2011   Health maintenance examination 05/06/2011   Choroidal nevus 01/22/2011   Essential hypertension, benign 06/12/2010   TESTICULAR HYPOFUNCTION 03/11/2009   Benign prostatic hyperplasia with urinary obstruction 02/04/2009   OSA on CPAP 02/04/2009   Type 2 diabetes mellitus with chronic kidney disease, with long-term current use of insulin  (HCC) 10/18/2006   Coronary artery disease 12/12/2001   Stress-induced cardiomyopathy 09/27/1998    PCP: Rilla Baller, MD  REFERRING PROVIDER: Hilma Hastings, PA-C  REFERRING DIAG: [phone] (ICD-10-CM) - Cervical spondylosis M50.30 (ICD-10-CM) - DDD (degenerative disc disease), cervical  THERAPY DIAG:  Cervicalgia  Abnormal posture  Rationale for Evaluation and Treatment: Rehabilitation  ONSET DATE: a couple years  SUBJECTIVE:                                                                                                                                                                                                         Per eval: Began a couple years ago,  occasional. No MOI or change in activities recalled. Worsening over past six months. Pt has had extensive work up with MRI, CTA, XR. Seen pain management and neurosurgery, referred from neurosurgery. States he was referred to neurology as well, has spoken with eye doctor about his vision issues. No n/t or UE pain.  States he hasn't yet had any treatment for it. States injection was recommended but denied by insurance. Tightness is more predictable but states vision/dizziness is not consistently easy to provoke.   SUBJECTIVE STATEMENT: Patient reports having increased neck pain and soreness after last visit.   PERTINENT HISTORY:  STEMI, PE/DVT hx, DM2, headache, CAD, OSA + CPAP, dizziness, ischemic colitis  PAIN:  Are you having pain: 0/10 middle of neck   Per eval:  Location/description: neck tightness, central/periscapular Best-worst over past week: 0-5/10  - aggravating factors: turning head, looking down, stooping forward, squatting, riding lawnmower - Easing factors: changing positioning, stretching  PRECAUTIONS: DVT/PE hx, cardiac hx  RED FLAGS: No fevers/chills, no nausea, no unintentional weight loss (on mounjaro ). Hx headaches.  Dizziness/vision issues since neck pain began, providers aware and significant work up performed. Describes vision issues as cloudiness and difficulty focusing vision. Difficulty describing clear/consistent provocative factors   WEIGHT BEARING RESTRICTIONS: No  FALLS:  Has patient fallen in last 6 months? No  LIVING ENVIRONMENT: 1 story home, no STE Lives w/ spouse who he states takes care of housework, pt does yard work  OCCUPATION: retired; used to do maintenance  PLOF: Independent  PATIENT GOALS: eliminate  pain  NEXT MD VISIT: telehealth in September, neurologist TBD  OBJECTIVE:  Note: Objective measures were completed at Evaluation unless otherwise noted.  DIAGNOSTIC FINDINGS:  CTA head/neck 09/20/23: IMPRESSION: 1. No evidence of acute intracranial abnormality. 2. Mild atherosclerosis in the head and neck without a large vessel occlusion, significant stenosis, or aneurysm. 3.  Aortic Atherosclerosis (ICD10-I70.0).  MRI C spine 08/12/23: IMPRESSION: 1. Chronic cervical spine degeneration most pronounced at C4-C5 and C5-C6. Faint degenerative endplate marrow edema at the latter. Mild degenerative spondylolisthesis at C7-T1. 2. No cervical spinal stenosis. Moderate to severe bilateral C6 neural foraminal stenosis, up to moderate at the right C5 and C7 nerve levels.  C spine XR flex/ext 06/22/23: IMPRESSION: 1. Grade 1 anterolisthesis of C3 on C4 in flexion which reduces upon extension. 2. Grade 1 anterolisthesis of C7 on T1 without dynamic listhesis. 3. Multilevel degenerative disc disease most pronounced at the C4-5 through C6-7 levels.  PATIENT SURVEYS:  NDI: 7/50  COGNITION: Overall cognitive status: Within functional limits for tasks assessed  SENSATION: LT intact BIL UE   POSTURE: FHP rounded shoulders  PALPATION: Concordant TTP BIL LS, UT, rhomboids   CERVICAL ROM:   ROM A/PROM (deg) eval AROM 11/25/23  Flexion 50 deg s   Extension 30 deg   Right lateral flexion 40 deg s   Left lateral flexion 18 deg s   Right rotation 35 deg s  42 deg premanual 51 deg post manual   Left rotation 45 deg s 58 deg premanual 55 deg post manual   (Blank rows = not tested) (Key: WFL = within functional limits not formally assessed, * = concordant pain, s = stiffness/stretching sensation, NT = not tested) Comments:   UPPER EXTREMITY ROM:  A/PROM Right eval Left eval  Shoulder flexion 153 deg s 151 deg s  Shoulder abduction    Shoulder internal rotation    Shoulder  external rotation    Elbow flexion    Elbow  extension    Wrist flexion    Wrist extension     (Blank rows = not tested) (Key: WFL = within functional limits not formally assessed, * = concordant pain, s = stiffness/stretching sensation, NT = not tested)  Comments:    UPPER EXTREMITY MMT:  MMT Right eval Left eval  Shoulder flexion 4+ 4+  Shoulder extension    Shoulder abduction 5 5  Shoulder extension 4+ 4+  Shoulder internal rotation    Shoulder external rotation    Elbow flexion 5 5  Elbow extension 4+ 4+  Grip strength 60# 60#  (Blank rows = not tested)  (Key: WFL = within functional limits not formally assessed, * = concordant pain, s = stiffness/stretching sensation, NT = not tested)  Comments:     TREATMENT DATE:    [facility] Adult PT Treatment:                                                DATE: 12/07/2023  Therapeutic Exercise: Seated thoracic ext 2x8 in chair - emphasis on comfortable ROM and appropriate head/neck positioning  Swiss ball flexion up wall 2x10 cues for posture and head positioning  Manual Therapy: Seated; light STM and trigger point release to BIL UT, LS, rhomboid   Neuromuscular re-ed: Seated shoulder rolls 2x10 BIL - performed each side independently w emphasis on motor control and reduced thoracic compensations Scap retraction x10 emphasis on reduced thoracic compensations Rows GTB 2x8 - cues for decreased trunk compensation   [facility] Adult PT Treatment:                                                DATE: 12/01/23 Therapeutic Exercise: Seated thoracic ext x10 in chair - emphasis on comfortable ROM and appropriate head/neck positioning  Cervical ext with pillowcase 5x5 Cervical rotation with pillowcase assist 5x5 Cervical lateral flexion with pillowcase assist x30 UT stretch x 30 Levator scap stretch x 30 Manual Therapy: Seated; STM and trigger point release to BIL UT, LS, cervical paraspinals Neuromuscular re-ed: Scap retraction 2x10  emphasis on reduced thoracic compensations Cervical retraction 10x3 Cervical retraction + rotation iso into hand 10x3 R&L Shoulder flexion AAROM with dowel seated x10 Shoulder ER RTB 2x10  Shoulder row RTB 2x10 Shoulder ext RTB 2x10 (one at a time to reduce cervical compensations)  [facility] Adult PT Treatment:                                                DATE: 11/26/23 Therapeutic Exercise: Seated thoracic ext 2x8 in chair - emphasis on comfortable ROM and appropriate head/neck positioning  Swiss ball flexion up wall 2x10 cues for posture and head positioning Manual Therapy: Seated; light STM and trigger point release to BIL UT, LS, rhomboid  Neuromuscular re-ed: Seated shoulder rolls 2x10 BIL - performed each side independently w emphasis on motor control and reduced thoracic compensations Scap retraction x10 emphasis on reduced thoracic compensations Cradle position scap retraction w/ 10 sec iso hold x5, improved tolerance Rows RTB 2x10 - cues for decreased trunk compensation  PATIENT EDUCATION:  Education details: rationale for interventions, HEP  Person educated: Patient Education method: Explanation, Demonstration, Tactile cues, Verbal cues Education comprehension: verbalized understanding, returned demonstration, verbal cues required, tactile cues required, and needs further education     HOME EXERCISE PROGRAM: Access Code: [email or web address] URL: [email or web address] Date: 11/25/2023 Prepared by: Alm Jenny  Exercises - Seated Scapular Retraction  - 2-3 x daily - 1 sets - 10 reps - Seated Shoulder Rolls  - 2-3 x daily - 1 sets - 10 reps - Seated Thoracic Lumbar Extension  - 2-3 x daily - 1 sets - 8 reps - Shoulder Flexion Wall Slide with Towel  - 2-3 x daily - 1 sets - 8 reps  ASSESSMENT:  CLINICAL IMPRESSION: 12/07/2023 Patient had improved tolerance of  today's session with reduced exercise volume. We will gradually increase resistance and volume for postural and cervicothoracic mm endurance as tolerated. Patient is encouraged to continue with cervical stretches at home as tolerated.   Per eval: Patient is a pleasant 73 y.o. gentleman who was seen today for physical therapy evaluation and treatment for neck pain/tightness. He does endorse some vision/dizziness issues which providers are aware of, extensive imaging work up as above (refer to [facility] for details). Overall red flag questioning is reassuring. On exam he demonstrates concordant limitations in cervical mobility, postural deficits, mild postural/GH weakness, and concordant TTP periscapular/cervical musculature. Tolerates exam/HEP well without adverse event or provocation of dizziness/visual issues. Recommend trial of skilled PT to address aforementioned deficits with aim of improving functional tolerance and reducing pain with typical activities. Pt departs today's session in no acute distress, all voiced concerns/questions addressed appropriately from PT perspective.      OBJECTIVE IMPAIRMENTS: decreased activity tolerance, decreased endurance, decreased mobility, decreased ROM, decreased strength, impaired UE functional use, postural dysfunction, and pain.   ACTIVITY LIMITATIONS: lifting and bending  PARTICIPATION LIMITATIONS: community activity and yard work  PERSONAL FACTORS: Age, Time since onset of injury/illness/exacerbation, and 3+ comorbidities: STEMI, PE/DVT hx, DM2, headache, CAD, OSA + CPAP, dizziness, ischemic colitis are also affecting patient's functional outcome.   REHAB POTENTIAL: Good  CLINICAL DECISION MAKING: Evolving/moderate complexity  EVALUATION COMPLEXITY: Moderate   GOALS:   SHORT TERM GOALS: Target date: 12/10/2023  Pt will demonstrate appropriate understanding and performance of initially prescribed HEP in order to facilitate improved independence with  management of symptoms.  Baseline: HEP established  Goal status: INITIAL   2. Pt will report at least 25% improvement in overall pain levels over past week in order to facilitate improved tolerance to typical daily activities.   Baseline: 0-5/10  Goal status: INITIAL    LONG TERM GOALS: Target date: 12/31/2023  Pt will score less than or equal to <3/50 on NDI in order to demonstrate improved perception of function due to symptoms (MDC 10-13 pts per Neysa dunker al 2009, 2010). Baseline: 7/50 Goal status: INITIAL  2. Pt will demonstrate at least 50 degrees of active cervical rotation ROM BIL in order to demonstrate improved environmental awareness and safety with driving.  Baseline: see ROM chart above Goal status: INITIAL  3. Pt will demonstrate MMT of 5/5 in tested groups for improved symmetry of UE strength and improved tolerance to functional movements.  Baseline: see MMT chart above Goal status: INITIAL   4. Pt will report at least 50% decrease in overall pain levels in past week in order to facilitate improved tolerance to basic ADLs/mobility.   Baseline: 0-5/10  Goal status: INITIAL     PLAN:  PT FREQUENCY: 2x/week  PT DURATION: 6 weeks  PLANNED INTERVENTIONS: [id number]- PT Re-evaluation, 97750- Physical Performance Testing, 97110-Therapeutic exercises, 97530- Therapeutic activity, [id number]- Neuromuscular re-education, 97535- Self Care, [id number]- Manual therapy, 20560 (1-2 muscles), 20561 (3+ muscles)- Dry Needling, Patient/Family education, Taping, Joint mobilization, Spinal mobilization, Cryotherapy, and Moist heat  PLAN FOR NEXT SESSION: Review/update HEP PRN. Work on [employer] exercises as appropriate with emphasis on postural strength/endurance. Symptom modification strategies as indicated/appropriate.    Marko Molt, PT, DPT  12/07/2023 10:01 AM

## 2023-12-09 ENCOUNTER — Ambulatory Visit

## 2023-12-14 ENCOUNTER — Ambulatory Visit

## 2023-12-14 DIAGNOSIS — M47812 Spondylosis without myelopathy or radiculopathy, cervical region: Secondary | ICD-10-CM | POA: Diagnosis not present

## 2023-12-14 DIAGNOSIS — G4733 Obstructive sleep apnea (adult) (pediatric): Secondary | ICD-10-CM | POA: Diagnosis not present

## 2023-12-14 DIAGNOSIS — M542 Cervicalgia: Secondary | ICD-10-CM | POA: Diagnosis not present

## 2023-12-14 DIAGNOSIS — M5030 Other cervical disc degeneration, unspecified cervical region: Secondary | ICD-10-CM | POA: Diagnosis not present

## 2023-12-14 DIAGNOSIS — R293 Abnormal posture: Secondary | ICD-10-CM

## 2023-12-14 NOTE — Therapy (Signed)
 OUTPATIENT PHYSICAL THERAPY TREATMENT   Patient Name: Alan Morrison MRN: [id number] DOB:08-15-1950, 73 y.o., male Today's Date: 12/14/2023  END OF SESSION:  PT End of Session - 12/14/23 0943     Visit Number 6    Number of Visits 13    Date for PT Re-Evaluation 12/31/23    Authorization Type [employer] medicare    Progress Note Due on Visit 10    PT Start Time 1000    PT Stop Time 1038    PT Time Calculation (min) 38 min    Activity Tolerance Patient tolerated treatment well    Behavior During Therapy WFL for tasks assessed/performed          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   by US    Arthritis Early 2000's   Bilateral pulmonary embolism (HCC) 02/05/2022   Cervical spondylosis without myelopathy    Choroidal nevus 01/22/2011   left, yearly eye exam, no diabetic retinopathy   COVID-19 virus infection 12/03/2022   Diabetes mellitus type II    DVT of deep femoral vein, left (HCC) 04/29/2018   LLE DVT from proximal femoral vein to popliteal vein into proximal calf (04/29/2018) started on xarelto  Heme (Magrinat) recommended continuing lifelong lower dose anticoagulant indefinitely (11/2018)   Dyspepsia    Ex-smoker    Fatty liver 02/2016   by US    GERD (gastroesophageal reflux disease)    Headache(784.0)    Heart murmur    Hx of   HLD (hyperlipidemia)    Hyperuricemia    Obesity    Sleep apnea    Stress-induced cardiomyopathy 09/27/1998   WNL, EF 64%   Urosepsis 01/01-01/05/10   Hospitalization   Past Surgical History:  Procedure Laterality Date   BIOPSY  10/10/2022   Procedure: BIOPSY;  Surgeon: Federico Rosario BROCKS, MD;  Location: [facility] ENDOSCOPY;  Service: Gastroenterology;;   CARDIAC CATHETERIZATION     COLONOSCOPY  07/2013   1 benign polyp rpt 10  yrs [place])   COLONOSCOPY WITH PROPOFOL  N/A 10/10/2022   Procedure: COLONOSCOPY WITH PROPOFOL ;  Surgeon: Federico Rosario BROCKS, MD;  Location: [facility] ENDOSCOPY;  Service: Gastroenterology;  Laterality: N/A;   CORONARY THROMBECTOMY N/A 11/12/2019   Procedure: Coronary Thrombectomy;  Surgeon: Claudene Victory ORN, MD;  Location: [facility] INVASIVE CV LAB;  Service: Cardiovascular;  Laterality: N/A;   CORONARY ULTRASOUND/IVUS N/A 01/30/2022   Procedure: Intravascular Ultrasound/IVUS;  Surgeon: Gretta Lonni PARAS, MD;  Location: [facility] INVASIVE CV LAB;  Service: Cardiovascular;  Laterality: N/A;  Venous   CORONARY/GRAFT ACUTE MI REVASCULARIZATION N/A 11/12/2019   Procedure: Coronary/Graft Acute MI Revascularization;  Surgeon: Claudene Victory ORN, MD;  Location: [facility] INVASIVE CV LAB;  Service: Cardiovascular;  Laterality: N/A;   KNEE ARTHROSCOPY Left 1988   KNEE SURGERY Right 05/21/03   Right, Dr. Addie, med meniscus tear via MRI   LEFT HEART CATH AND CORONARY ANGIOGRAPHY N/A 04/01/2017   Procedure: LEFT HEART CATH AND CORONARY ANGIOGRAPHY;  Surgeon: [place], Peter M, MD;  Location: [facility] INVASIVE CV LAB;  Service: Cardiovascular;  Laterality: N/A;   LEFT HEART CATH AND CORONARY ANGIOGRAPHY N/A 11/12/2019   Procedure: LEFT HEART CATH AND CORONARY ANGIOGRAPHY;  Surgeon: Claudene Victory ORN, MD;  Location: [facility] INVASIVE CV LAB;  Service: Cardiovascular;  Laterality: N/A;   MYELOGRAM  08/05   bulging disc   PERIPHERAL VASCULAR BALLOON ANGIOPLASTY  01/30/2022   Procedure: PERIPHERAL VASCULAR BALLOON ANGIOPLASTY;  Surgeon: Gretta Lonni PARAS, MD;  Location: [facility] INVASIVE CV LAB;  Service: Cardiovascular;;   PERIPHERAL VASCULAR THROMBECTOMY Right 01/30/2022   Procedure: PERIPHERAL VASCULAR THROMBECTOMY;  Surgeon: Gretta Lonni PARAS, MD;  Location: [facility] INVASIVE CV LAB;  Service: Cardiovascular;  Laterality: Right;   Patient Active Problem List   Diagnosis Date Noted   Foraminal stenosis of cervical region 10/21/2023   Cervical radicular pain  10/21/2023   Medicare annual wellness visit, subsequent 06/22/2023   Degeneration of cervical intervertebral disc 06/22/2023   Hypoglycemia 01/13/2023   Carotid artery calcification, bilateral 11/02/2022   Dizziness 10/26/2022   Ischemic colitis (HCC) 10/08/2022   Adrenal adenoma, left 02/05/2022   Right lower lobe pulmonary nodule 02/05/2022   Acute deep vein thrombosis (DVT) of iliac vein of right lower extremity (HCC) 01/29/2022   Pain in both lower extremities 01/15/2022   History of recurrent deep vein thrombosis (DVT) 01/03/2022   Poliosis 10/14/2021   Advanced directives, counseling/discussion 02/02/2019   GERD (gastroesophageal reflux disease)    Cervical spondylosis without myelopathy    Anemia, iron  deficiency, inadequate dietary intake    Angina pectoris (HCC) 04/01/2017   Right hip pain 06/09/2016   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   Fatty liver 02/19/2016   Hyperuricemia    Ex-smoker    History of MRSA infection 12/19/2013   BPV (benign positional vertigo) 12/13/2013   Hyperlipidemia associated with type 2 diabetes mellitus (HCC)    Vitamin D  deficiency 05/01/2012   Body mass index (BMI) 25.0-25.9, adult 06/08/2011   Health maintenance examination 05/06/2011   Choroidal nevus 01/22/2011   Essential hypertension, benign 06/12/2010   TESTICULAR HYPOFUNCTION 03/11/2009   Benign prostatic hyperplasia with urinary obstruction 02/04/2009   OSA on CPAP 02/04/2009   Type 2 diabetes mellitus with chronic kidney disease, with long-term current use of insulin  (HCC) 10/18/2006   Coronary artery disease 12/12/2001   Stress-induced cardiomyopathy 09/27/1998    PCP: Rilla Baller, MD  REFERRING PROVIDER: Hilma Hastings, PA-C  REFERRING DIAG: [phone] (ICD-10-CM) - Cervical spondylosis M50.30 (ICD-10-CM) - DDD (degenerative disc disease), cervical  THERAPY DIAG:  Cervicalgia  Abnormal posture  Rationale for Evaluation and Treatment: Rehabilitation  ONSET  DATE: a couple years  SUBJECTIVE:  Per eval: Began a couple years ago, occasional. No MOI or change in activities recalled. Worsening over past six months. Pt has had extensive work up with MRI, CTA, XR. Seen pain management and neurosurgery, referred from neurosurgery. States he was referred to neurology as well, has spoken with eye doctor about his vision issues. No n/t or UE pain.  States he hasn't yet had any treatment for it. States injection was recommended but denied by insurance. Tightness is more predictable but states vision/dizziness is not consistently easy to provoke.   SUBJECTIVE STATEMENT: Patient reports that he doesn't have any pain today, did not have any soreness after last session.   PERTINENT HISTORY:  STEMI, PE/DVT hx, DM2, headache, CAD, OSA + CPAP, dizziness, ischemic colitis  PAIN:  Are you having pain: 0/10 middle of neck   Per eval:  Location/description: neck tightness, central/periscapular Best-worst over past week: 0-5/10  - aggravating factors: turning head, looking down, stooping forward, squatting, riding lawnmower - Easing factors: changing positioning, stretching  PRECAUTIONS: DVT/PE hx, cardiac hx  RED FLAGS: No fevers/chills, no nausea, no unintentional weight loss (on mounjaro ). Hx headaches.  Dizziness/vision issues since neck pain began, providers aware and significant work up performed. Describes vision issues as cloudiness and difficulty focusing vision. Difficulty describing clear/consistent provocative factors   WEIGHT BEARING RESTRICTIONS: No  FALLS:  Has patient fallen in last 6 months? No  LIVING ENVIRONMENT: 1 story home, no STE Lives w/ spouse who he states takes care of housework, pt does yard work  OCCUPATION: retired; used to do  maintenance  PLOF: Independent  PATIENT GOALS: eliminate pain  NEXT MD VISIT: telehealth in September, neurologist TBD  OBJECTIVE:  Note: Objective measures were completed at Evaluation unless otherwise noted.  DIAGNOSTIC FINDINGS:  CTA head/neck 09/20/23: IMPRESSION: 1. No evidence of acute intracranial abnormality. 2. Mild atherosclerosis in the head and neck without a large vessel occlusion, significant stenosis, or aneurysm. 3.  Aortic Atherosclerosis (ICD10-I70.0).  MRI C spine 08/12/23: IMPRESSION: 1. Chronic cervical spine degeneration most pronounced at C4-C5 and C5-C6. Faint degenerative endplate marrow edema at the latter. Mild degenerative spondylolisthesis at C7-T1. 2. No cervical spinal stenosis. Moderate to severe bilateral C6 neural foraminal stenosis, up to moderate at the right C5 and C7 nerve levels.  C spine XR flex/ext 06/22/23: IMPRESSION: 1. Grade 1 anterolisthesis of C3 on C4 in flexion which reduces upon extension. 2. Grade 1 anterolisthesis of C7 on T1 without dynamic listhesis. 3. Multilevel degenerative disc disease most pronounced at the C4-5 through C6-7 levels.  PATIENT SURVEYS:  NDI: 7/50  COGNITION: Overall cognitive status: Within functional limits for tasks assessed  SENSATION: LT intact BIL UE   POSTURE: FHP rounded shoulders  PALPATION: Concordant TTP BIL LS, UT, rhomboids   CERVICAL ROM:   ROM A/PROM (deg) eval AROM 11/25/23  Flexion 50 deg s   Extension 30 deg   Right lateral flexion 40 deg s   Left lateral flexion 18 deg s   Right rotation 35 deg s  42 deg premanual 51 deg post manual   Left rotation 45 deg s 58 deg premanual 55 deg post manual   (Blank rows = not tested) (Key: WFL = within functional limits not formally assessed, * = concordant pain, s = stiffness/stretching sensation, NT = not tested) Comments:   UPPER EXTREMITY ROM:  A/PROM Right eval Left eval  Shoulder flexion 153 deg s 151 deg s   Shoulder abduction    Shoulder internal rotation  Shoulder external rotation    Elbow flexion    Elbow extension    Wrist flexion    Wrist extension     (Blank rows = not tested) (Key: WFL = within functional limits not formally assessed, * = concordant pain, s = stiffness/stretching sensation, NT = not tested)  Comments:    UPPER EXTREMITY MMT:  MMT Right eval Left eval  Shoulder flexion 4+ 4+  Shoulder extension    Shoulder abduction 5 5  Shoulder extension 4+ 4+  Shoulder internal rotation    Shoulder external rotation    Elbow flexion 5 5  Elbow extension 4+ 4+  Grip strength 60# 60#  (Blank rows = not tested)  (Key: WFL = within functional limits not formally assessed, * = concordant pain, s = stiffness/stretching sensation, NT = not tested)  Comments:     TREATMENT DATE:  [facility] Adult PT Treatment:                                                DATE: 12/14/23 Therapeutic Exercise: Seated thoracic ext 2x8 in chair - emphasis on comfortable ROM and appropriate head/neck positioning  Swiss ball flexion up wall 2x10 cues for posture and head positioning Manual Therapy: Seated; light STM and trigger point release to BIL UT, LS, rhomboid  Neuromuscular re-ed: Seated shoulder rolls 2x10 BIL - performed each side independently w emphasis on motor control and reduced thoracic compensations Seated shoulder ER with scap retraction RTB x10  Seated horizontal abduction RTB x10 Shoulder row RTB x10 Shoulder alternating ext RTB x10  [facility] Adult PT Treatment:                                                DATE: 12/07/2023  Therapeutic Exercise: Seated thoracic ext 2x8 in chair - emphasis on comfortable ROM and appropriate head/neck positioning  Swiss ball flexion up wall 2x10 cues for posture and head positioning  Manual Therapy: Seated; light STM and trigger point release to BIL UT, LS, rhomboid   Neuromuscular re-ed: Seated shoulder rolls 2x10 BIL - performed each side  independently w emphasis on motor control and reduced thoracic compensations Scap retraction x10 emphasis on reduced thoracic compensations Rows GTB 2x8 - cues for decreased trunk compensation   [facility] Adult PT Treatment:                                                DATE: 12/01/23 Therapeutic Exercise: Seated thoracic ext x10 in chair - emphasis on comfortable ROM and appropriate head/neck positioning  Cervical ext with pillowcase 5x5 Cervical rotation with pillowcase assist 5x5 Cervical lateral flexion with pillowcase assist x30 UT stretch x 30 Levator scap stretch x 30 Manual Therapy: Seated; STM and trigger point release to BIL UT, LS, cervical paraspinals Neuromuscular re-ed: Scap retraction 2x10 emphasis on reduced thoracic compensations Cervical retraction 10x3 Cervical retraction + rotation iso into hand 10x3 R&L Shoulder flexion AAROM with dowel seated x10 Shoulder ER RTB 2x10  Shoulder row RTB 2x10 Shoulder ext RTB 2x10 (one at a time to reduce cervical compensations)  PATIENT EDUCATION:  Education details: rationale for interventions, HEP  Person educated: Patient Education method: Explanation, Demonstration, Tactile cues, Verbal cues Education comprehension: verbalized understanding, returned demonstration, verbal cues required, tactile cues required, and needs further education     HOME EXERCISE PROGRAM: Access Code: [email or web address] URL: [email or web address] Date: 11/25/2023 Prepared by: Alm Jenny  Exercises - Seated Scapular Retraction  - 2-3 x daily - 1 sets - 10 reps - Seated Shoulder Rolls  - 2-3 x daily - 1 sets - 10 reps - Seated Thoracic Lumbar Extension  - 2-3 x daily - 1 sets - 8 reps - Shoulder Flexion Wall Slide with Towel  - 2-3 x daily - 1 sets - 8 reps  ASSESSMENT:  CLINICAL IMPRESSION: 12/14/2023 Patient presents to PT reporting no current pain, no soreness from  previous session and no pain over the weekend. Session today focused on periscapular strengthening and mobility with gently increasing resistance and volume within his tolerance. Patient was able to tolerate all prescribed exercises with no adverse effects. Patient continues to benefit from skilled PT services and should be progressed as able to improve functional independence.    Per eval: Patient is a pleasant 73 y.o. gentleman who was seen today for physical therapy evaluation and treatment for neck pain/tightness. He does endorse some vision/dizziness issues which providers are aware of, extensive imaging work up as above (refer to [facility] for details). Overall red flag questioning is reassuring. On exam he demonstrates concordant limitations in cervical mobility, postural deficits, mild postural/GH weakness, and concordant TTP periscapular/cervical musculature. Tolerates exam/HEP well without adverse event or provocation of dizziness/visual issues. Recommend trial of skilled PT to address aforementioned deficits with aim of improving functional tolerance and reducing pain with typical activities. Pt departs today's session in no acute distress, all voiced concerns/questions addressed appropriately from PT perspective.      OBJECTIVE IMPAIRMENTS: decreased activity tolerance, decreased endurance, decreased mobility, decreased ROM, decreased strength, impaired UE functional use, postural dysfunction, and pain.   ACTIVITY LIMITATIONS: lifting and bending  PARTICIPATION LIMITATIONS: community activity and yard work  PERSONAL FACTORS: Age, Time since onset of injury/illness/exacerbation, and 3+ comorbidities: STEMI, PE/DVT hx, DM2, headache, CAD, OSA + CPAP, dizziness, ischemic colitis are also affecting patient's functional outcome.   REHAB POTENTIAL: Good  CLINICAL DECISION MAKING: Evolving/moderate complexity  EVALUATION COMPLEXITY: Moderate   GOALS:   SHORT TERM GOALS: Target date:  12/10/2023  Pt will demonstrate appropriate understanding and performance of initially prescribed HEP in order to facilitate improved independence with management of symptoms.  Baseline: HEP established  Goal status: MET Pt reports adherence 12/14/23   2. Pt will report at least 25% improvement in overall pain levels over past week in order to facilitate improved tolerance to typical daily activities.   Baseline: 0-5/10  Goal status: Ongoing  Pt reports highest pain of 6-7 over the last week 12/14/23  LONG TERM GOALS: Target date: 12/31/2023  Pt will score less than or equal to <3/50 on NDI in order to demonstrate improved perception of function due to symptoms (MDC 10-13 pts per Neysa dunker al 2009, 2010). Baseline: 7/50 Goal status: INITIAL  2. Pt will demonstrate at least 50 degrees of active cervical rotation ROM BIL in order to demonstrate improved environmental awareness and safety with driving.  Baseline: see ROM chart above Goal status: INITIAL  3. Pt will demonstrate MMT of 5/5 in tested groups for improved symmetry of UE strength and improved tolerance to functional movements.  Baseline: see MMT chart  above Goal status: INITIAL   4. Pt will report at least 50% decrease in overall pain levels in past week in order to facilitate improved tolerance to basic ADLs/mobility.   Baseline: 0-5/10  Goal status: INITIAL     PLAN:  PT FREQUENCY: 2x/week  PT DURATION: 6 weeks  PLANNED INTERVENTIONS: [id number]- PT Re-evaluation, 97750- Physical Performance Testing, 97110-Therapeutic exercises, 97530- Therapeutic activity, [id number]- Neuromuscular re-education, 97535- Self Care, [id number]- Manual therapy, 20560 (1-2 muscles), 20561 (3+ muscles)- Dry Needling, Patient/Family education, Taping, Joint mobilization, Spinal mobilization, Cryotherapy, and Moist heat  PLAN FOR NEXT SESSION: Review/update HEP PRN. Work on [employer] exercises as appropriate with emphasis on postural strength/endurance.  Symptom modification strategies as indicated/appropriate.    Corean Pouch PTA  12/14/2023 10:40 AM

## 2023-12-16 ENCOUNTER — Ambulatory Visit

## 2023-12-16 DIAGNOSIS — M542 Cervicalgia: Secondary | ICD-10-CM | POA: Diagnosis not present

## 2023-12-16 DIAGNOSIS — R293 Abnormal posture: Secondary | ICD-10-CM

## 2023-12-16 DIAGNOSIS — M5030 Other cervical disc degeneration, unspecified cervical region: Secondary | ICD-10-CM | POA: Diagnosis not present

## 2023-12-16 DIAGNOSIS — M47812 Spondylosis without myelopathy or radiculopathy, cervical region: Secondary | ICD-10-CM | POA: Diagnosis not present

## 2023-12-16 NOTE — Therapy (Signed)
 OUTPATIENT PHYSICAL THERAPY TREATMENT NOTE   Patient Name: Alan Morrison MRN: [id number] DOB:1951-02-25, 73 y.o., male Today's Date: 12/16/2023  END OF SESSION:  PT End of Session - 12/16/23 0955     Visit Number 7    Number of Visits 13    Date for PT Re-Evaluation 12/31/23    Authorization Type aetna medicare    PT Start Time 0929    PT Stop Time 0958    PT Time Calculation (min) 29 min    Activity Tolerance Patient tolerated treatment well    Behavior During Therapy [facility] for tasks assessed/performed           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   by US    Arthritis Early 2000's   Bilateral pulmonary embolism (HCC) 02/05/2022   Cervical spondylosis without myelopathy    Choroidal nevus 01/22/2011   left, yearly eye exam, no diabetic retinopathy   COVID-19 virus infection 12/03/2022   Diabetes mellitus type II    DVT of deep femoral vein, left (HCC) 04/29/2018   LLE DVT from proximal femoral vein to popliteal vein into proximal calf (04/29/2018) started on xarelto  Heme (Magrinat) recommended continuing lifelong lower dose anticoagulant indefinitely (11/2018)   Dyspepsia    Ex-smoker    Fatty liver 02/2016   by US    GERD (gastroesophageal reflux disease)    Headache(784.0)    Heart murmur    Hx of   HLD (hyperlipidemia)    Hyperuricemia    Obesity    Sleep apnea    Stress-induced cardiomyopathy 09/27/1998   WNL, EF 64%   Urosepsis 01/01-01/05/10   Hospitalization   Past Surgical History:  Procedure Laterality Date   BIOPSY  10/10/2022   Procedure: BIOPSY;  Surgeon: Federico Rosario BROCKS, MD;  Location: [facility] ENDOSCOPY;  Service: Gastroenterology;;   CARDIAC CATHETERIZATION     COLONOSCOPY  07/2013   1 benign polyp rpt 10 yrs Verda)   COLONOSCOPY  WITH PROPOFOL  N/A 10/10/2022   Procedure: COLONOSCOPY WITH PROPOFOL ;  Surgeon: Federico Rosario BROCKS, MD;  Location: [facility] ENDOSCOPY;  Service: Gastroenterology;  Laterality: N/A;   CORONARY THROMBECTOMY N/A 11/12/2019   Procedure: Coronary Thrombectomy;  Surgeon: Claudene Victory ORN, MD;  Location: [facility] INVASIVE CV LAB;  Service: Cardiovascular;  Laterality: N/A;   CORONARY ULTRASOUND/IVUS N/A 01/30/2022   Procedure: Intravascular Ultrasound/IVUS;  Surgeon: Gretta Lonni PARAS, MD;  Location: [facility] INVASIVE CV LAB;  Service: Cardiovascular;  Laterality: N/A;  Venous   CORONARY/GRAFT ACUTE MI REVASCULARIZATION N/A 11/12/2019   Procedure: Coronary/Graft Acute MI Revascularization;  Surgeon: Claudene Victory ORN, MD;  Location: [facility] INVASIVE CV LAB;  Service: Cardiovascular;  Laterality: N/A;   KNEE ARTHROSCOPY Left 1988   KNEE SURGERY Right 05/21/03   Right, Dr. Addie, med meniscus tear via MRI   LEFT HEART CATH AND CORONARY ANGIOGRAPHY N/A 04/01/2017   Procedure: LEFT HEART CATH AND CORONARY ANGIOGRAPHY;  Surgeon: Swaziland, Peter M, MD;  Location: [facility] INVASIVE CV LAB;  Service: Cardiovascular;  Laterality: N/A;   LEFT HEART CATH AND CORONARY ANGIOGRAPHY N/A 11/12/2019   Procedure: LEFT HEART CATH AND CORONARY ANGIOGRAPHY;  Surgeon: Claudene Victory ORN, MD;  Location: [facility] INVASIVE CV LAB;  Service: Cardiovascular;  Laterality: N/A;   MYELOGRAM  08/05   bulging disc  PERIPHERAL VASCULAR BALLOON ANGIOPLASTY  01/30/2022   Procedure: PERIPHERAL VASCULAR BALLOON ANGIOPLASTY;  Surgeon: Gretta Lonni PARAS, MD;  Location: [facility] INVASIVE CV LAB;  Service: Cardiovascular;;   PERIPHERAL VASCULAR THROMBECTOMY Right 01/30/2022   Procedure: PERIPHERAL VASCULAR THROMBECTOMY;  Surgeon: Gretta Lonni PARAS, MD;  Location: [facility] INVASIVE CV LAB;  Service: Cardiovascular;  Laterality: Right;   Patient Active Problem List   Diagnosis Date Noted   Foraminal stenosis of cervical region 10/21/2023   Cervical radicular pain 10/21/2023   Medicare annual  wellness visit, subsequent 06/22/2023   Degeneration of cervical intervertebral disc 06/22/2023   Hypoglycemia 01/13/2023   Carotid artery calcification, bilateral 11/02/2022   Dizziness 10/26/2022   Ischemic colitis (HCC) 10/08/2022   Adrenal adenoma, left 02/05/2022   Right lower lobe pulmonary nodule 02/05/2022   Acute deep vein thrombosis (DVT) of iliac vein of right lower extremity (HCC) 01/29/2022   Pain in both lower extremities 01/15/2022   History of recurrent deep vein thrombosis (DVT) 01/03/2022   Poliosis 10/14/2021   Advanced directives, counseling/discussion 02/02/2019   GERD (gastroesophageal reflux disease)    Cervical spondylosis without myelopathy    Anemia, iron  deficiency, inadequate dietary intake    Angina pectoris (HCC) 04/01/2017   Right hip pain 06/09/2016   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   Fatty liver 02/19/2016   Hyperuricemia    Ex-smoker    History of MRSA infection 12/19/2013   BPV (benign positional vertigo) 12/13/2013   Hyperlipidemia associated with type 2 diabetes mellitus (HCC)    Vitamin D  deficiency 05/01/2012   Body mass index (BMI) 25.0-25.9, adult 06/08/2011   Health maintenance examination 05/06/2011   Choroidal nevus 01/22/2011   Essential hypertension, benign 06/12/2010   TESTICULAR HYPOFUNCTION 03/11/2009   Benign prostatic hyperplasia with urinary obstruction 02/04/2009   OSA on CPAP 02/04/2009   Type 2 diabetes mellitus with chronic kidney disease, with long-term current use of insulin  (HCC) 10/18/2006   Coronary artery disease 12/12/2001   Stress-induced cardiomyopathy 09/27/1998    PCP: Rilla Baller, MD  REFERRING PROVIDER: Hilma Hastings, PA-C  REFERRING DIAG: [phone] (ICD-10-CM) - Cervical spondylosis M50.30 (ICD-10-CM) - DDD (degenerative disc disease), cervical  THERAPY DIAG:  Cervicalgia  Abnormal posture  Rationale for Evaluation and Treatment: Rehabilitation  ONSET DATE: a couple  years  SUBJECTIVE:                                                                                                                                                                                                        Per eval: Began a couple years ago,  occasional. No MOI or change in activities recalled. Worsening over past six months. Pt has had extensive work up with MRI, CTA, XR. Seen pain management and neurosurgery, referred from neurosurgery. States he was referred to neurology as well, has spoken with eye doctor about his vision issues. No n/t or UE pain.  States he hasn't yet had any treatment for it. States injection was recommended but denied by insurance. Tightness is more predictable but states vision/dizziness is not consistently easy to provoke.   SUBJECTIVE STATEMENT: Patient reports that he doesn't have any pain today.  PERTINENT HISTORY:  STEMI, PE/DVT hx, DM2, headache, CAD, OSA + CPAP, dizziness, ischemic colitis  PAIN:  Are you having pain: 0/10 middle of neck   Per eval:  Location/description: neck tightness, central/periscapular Best-worst over past week: 0-5/10  - aggravating factors: turning head, looking down, stooping forward, squatting, riding lawnmower - Easing factors: changing positioning, stretching  PRECAUTIONS: DVT/PE hx, cardiac hx  RED FLAGS: No fevers/chills, no nausea, no unintentional weight loss (on mounjaro ). Hx headaches.  Dizziness/vision issues since neck pain began, providers aware and significant work up performed. Describes vision issues as cloudiness and difficulty focusing vision. Difficulty describing clear/consistent provocative factors   WEIGHT BEARING RESTRICTIONS: No  FALLS:  Has patient fallen in last 6 months? No  LIVING ENVIRONMENT: 1 story home, no STE Lives w/ spouse who he states takes care of housework, pt does yard work  OCCUPATION: retired; used to do maintenance  PLOF: Independent  PATIENT GOALS: eliminate  pain  NEXT MD VISIT: telehealth in September, neurologist TBD  OBJECTIVE:  Note: Objective measures were completed at Evaluation unless otherwise noted.  DIAGNOSTIC FINDINGS:  CTA head/neck 09/20/23: IMPRESSION: 1. No evidence of acute intracranial abnormality. 2. Mild atherosclerosis in the head and neck without a large vessel occlusion, significant stenosis, or aneurysm. 3.  Aortic Atherosclerosis (ICD10-I70.0).  MRI C spine 08/12/23: IMPRESSION: 1. Chronic cervical spine degeneration most pronounced at C4-C5 and C5-C6. Faint degenerative endplate marrow edema at the latter. Mild degenerative spondylolisthesis at C7-T1. 2. No cervical spinal stenosis. Moderate to severe bilateral C6 neural foraminal stenosis, up to moderate at the right C5 and C7 nerve levels.  C spine XR flex/ext 06/22/23: IMPRESSION: 1. Grade 1 anterolisthesis of C3 on C4 in flexion which reduces upon extension. 2. Grade 1 anterolisthesis of C7 on T1 without dynamic listhesis. 3. Multilevel degenerative disc disease most pronounced at the C4-5 through C6-7 levels.  PATIENT SURVEYS:  NDI: 7/50  COGNITION: Overall cognitive status: Within functional limits for tasks assessed  SENSATION: LT intact BIL UE   POSTURE: FHP rounded shoulders  PALPATION: Concordant TTP BIL LS, UT, rhomboids   CERVICAL ROM:   ROM A/PROM (deg) eval AROM 11/25/23  Flexion 50 deg s   Extension 30 deg   Right lateral flexion 40 deg s   Left lateral flexion 18 deg s   Right rotation 35 deg s  42 deg premanual 51 deg post manual   Left rotation 45 deg s 58 deg premanual 55 deg post manual   (Blank rows = not tested) (Key: WFL = within functional limits not formally assessed, * = concordant pain, s = stiffness/stretching sensation, NT = not tested) Comments:   UPPER EXTREMITY ROM:  A/PROM Right eval Left eval  Shoulder flexion 153 deg s 151 deg s  Shoulder abduction    Shoulder internal rotation    Shoulder  external rotation    Elbow flexion    Elbow extension  Wrist flexion    Wrist extension     (Blank rows = not tested) (Key: WFL = within functional limits not formally assessed, * = concordant pain, s = stiffness/stretching sensation, NT = not tested)  Comments:    UPPER EXTREMITY MMT:  MMT Right eval Left eval  Shoulder flexion 4+ 4+  Shoulder extension    Shoulder abduction 5 5  Shoulder extension 4+ 4+  Shoulder internal rotation    Shoulder external rotation    Elbow flexion 5 5  Elbow extension 4+ 4+  Grip strength 60# 60#  (Blank rows = not tested)  (Key: WFL = within functional limits not formally assessed, * = concordant pain, s = stiffness/stretching sensation, NT = not tested)  Comments:     TREATMENT DATE:   [facility] Adult PT Treatment:                                                DATE:12/16/2023  Therapeutic Exercise: Seated thoracic ext 2x8 in chair - emphasis on comfortable ROM and appropriate head/neck positioning  Swiss ball flexion up wall 2x10 cues for posture and head positioning  Neuromuscular re-ed: Seated shoulder rolls 15 BIL - performed each side independently w emphasis on motor control and reduced thoracic compensations Seated shoulder ER with scap retraction RTB x10  Seated horizontal abduction GTB x10  [facility] Adult PT Treatment:                                                DATE: 12/14/23  Therapeutic Exercise: Seated thoracic ext 2x8 in chair - emphasis on comfortable ROM and appropriate head/neck positioning  Swiss ball flexion up wall 2x10 cues for posture and head positioning Manual Therapy: Seated; light STM and trigger point release to BIL UT, LS, rhomboid  Neuromuscular re-ed: Seated shoulder rolls 2x10 BIL - performed each side independently w emphasis on motor control and reduced thoracic compensations Seated shoulder ER with scap retraction RTB x10  Seated horizontal abduction RTB x10 Shoulder row RTB x10 Shoulder alternating  ext RTB x10  [facility] Adult PT Treatment:                                                DATE: 12/07/2023  Therapeutic Exercise: Seated thoracic ext 2x8 in chair - emphasis on comfortable ROM and appropriate head/neck positioning  Swiss ball flexion up wall 2x10 cues for posture and head positioning  Manual Therapy: Seated; light STM and trigger point release to BIL UT, LS, rhomboid   Neuromuscular re-ed: Seated shoulder rolls 2x10 BIL - performed each side independently w emphasis on motor control and reduced thoracic compensations Scap retraction x10 emphasis on reduced thoracic compensations Rows GTB 2x8 - cues for decreased trunk compensation     PATIENT EDUCATION:  Education details: rationale for interventions, HEP  Person educated: Patient Education method: Explanation, Demonstration, Tactile cues, Verbal cues Education comprehension: verbalized understanding, returned demonstration, verbal cues required, tactile cues required, and needs further education     HOME EXERCISE PROGRAM: Access Code: [email or web address] URL: [email or web address] Date: 11/25/2023 Prepared by: Alm Jenny  Exercises - Seated Scapular Retraction  - 2-3 x daily - 1 sets - 10 reps - Seated Shoulder Rolls  - 2-3 x daily - 1 sets - 10 reps - Seated Thoracic Lumbar Extension  - 2-3 x daily - 1 sets - 8 reps - Shoulder Flexion Wall Slide with Towel  - 2-3 x daily - 1 sets - 8 reps  ASSESSMENT:  CLINICAL IMPRESSION: 12/16/2023 Alan Morrison had good tolerance of today's treatment session, which focused on progression of resistance. Plan is for patient to follow up with referring provider and then f/u for discharge planning.     Per eval: Patient is a pleasant 73 y.o. gentleman who was seen today for physical therapy evaluation and treatment for neck pain/tightness. He does endorse some vision/dizziness issues which providers are aware of, extensive imaging work up as above (refer to [facility] for details).  Overall red flag questioning is reassuring. On exam he demonstrates concordant limitations in cervical mobility, postural deficits, mild postural/GH weakness, and concordant TTP periscapular/cervical musculature. Tolerates exam/HEP well without adverse event or provocation of dizziness/visual issues. Recommend trial of skilled PT to address aforementioned deficits with aim of improving functional tolerance and reducing pain with typical activities. Pt departs today's session in no acute distress, all voiced concerns/questions addressed appropriately from PT perspective.      OBJECTIVE IMPAIRMENTS: decreased activity tolerance, decreased endurance, decreased mobility, decreased ROM, decreased strength, impaired UE functional use, postural dysfunction, and pain.   ACTIVITY LIMITATIONS: lifting and bending  PARTICIPATION LIMITATIONS: community activity and yard work  PERSONAL FACTORS: Age, Time since onset of injury/illness/exacerbation, and 3+ comorbidities: STEMI, PE/DVT hx, DM2, headache, CAD, OSA + CPAP, dizziness, ischemic colitis are also affecting patient's functional outcome.   REHAB POTENTIAL: Good  CLINICAL DECISION MAKING: Evolving/moderate complexity  EVALUATION COMPLEXITY: Moderate   GOALS:   SHORT TERM GOALS: Target date: 12/10/2023  Pt will demonstrate appropriate understanding and performance of initially prescribed HEP in order to facilitate improved independence with management of symptoms.  Baseline: HEP established  Goal status: MET Pt reports adherence 12/14/23   2. Pt will report at least 25% improvement in overall pain levels over past week in order to facilitate improved tolerance to typical daily activities.   Baseline: 0-5/10  Goal status: Ongoing  Pt reports highest pain of 6-7 over the last week 12/14/23  LONG TERM GOALS: Target date: 12/31/2023  Pt will score less than or equal to <3/50 on NDI in order to demonstrate improved perception of function due to  symptoms (MDC 10-13 pts per Neysa dunker al 2009, 2010). Baseline: 7/50 Goal status: INITIAL  2. Pt will demonstrate at least 50 degrees of active cervical rotation ROM BIL in order to demonstrate improved environmental awareness and safety with driving.  Baseline: see ROM chart above Goal status: INITIAL  3. Pt will demonstrate MMT of 5/5 in tested groups for improved symmetry of UE strength and improved tolerance to functional movements.  Baseline: see MMT chart above Goal status: INITIAL   4. Pt will report at least 50% decrease in overall pain levels in past week in order to facilitate improved tolerance to basic ADLs/mobility.   Baseline: 0-5/10  Goal status: INITIAL     PLAN:  PT FREQUENCY: 2x/week  PT DURATION: 6 weeks  PLANNED INTERVENTIONS: [id number]- PT Re-evaluation, 97750- Physical Performance Testing, 97110-Therapeutic exercises, 97530- Therapeutic activity, 97112- Neuromuscular re-education, 97535- Self Care, [id number]- Manual therapy, 20560 (1-2 muscles), 20561 (3+ muscles)- Dry Needling, Patient/Family education, Taping, Joint mobilization, Spinal mobilization,  Cryotherapy, and Moist heat  PLAN FOR NEXT SESSION: Review/update HEP PRN. Work on [employer] exercises as appropriate with emphasis on postural strength/endurance. Symptom modification strategies as indicated/appropriate.    Marko Molt, PT, DPT  12/16/2023 12:02 PM

## 2023-12-24 ENCOUNTER — Encounter: Payer: Self-pay | Admitting: Family Medicine

## 2023-12-24 ENCOUNTER — Ambulatory Visit: Admitting: Family Medicine

## 2023-12-24 VITALS — BP 122/66 | HR 88 | Temp 97.9°F | Ht 67.5 in | Wt 163.5 lb

## 2023-12-24 DIAGNOSIS — I6523 Occlusion and stenosis of bilateral carotid arteries: Secondary | ICD-10-CM | POA: Diagnosis not present

## 2023-12-24 DIAGNOSIS — E1122 Type 2 diabetes mellitus with diabetic chronic kidney disease: Secondary | ICD-10-CM | POA: Diagnosis not present

## 2023-12-24 DIAGNOSIS — E785 Hyperlipidemia, unspecified: Secondary | ICD-10-CM | POA: Diagnosis not present

## 2023-12-24 DIAGNOSIS — R42 Dizziness and giddiness: Secondary | ICD-10-CM | POA: Diagnosis not present

## 2023-12-24 DIAGNOSIS — E1169 Type 2 diabetes mellitus with other specified complication: Secondary | ICD-10-CM | POA: Diagnosis not present

## 2023-12-24 DIAGNOSIS — G4733 Obstructive sleep apnea (adult) (pediatric): Secondary | ICD-10-CM | POA: Diagnosis not present

## 2023-12-24 DIAGNOSIS — M79672 Pain in left foot: Secondary | ICD-10-CM

## 2023-12-24 DIAGNOSIS — M47812 Spondylosis without myelopathy or radiculopathy, cervical region: Secondary | ICD-10-CM | POA: Diagnosis not present

## 2023-12-24 DIAGNOSIS — Z794 Long term (current) use of insulin: Secondary | ICD-10-CM

## 2023-12-24 DIAGNOSIS — N1831 Chronic kidney disease, stage 3a: Secondary | ICD-10-CM | POA: Diagnosis not present

## 2023-12-24 NOTE — Patient Instructions (Addendum)
 We will refer you to podiatry in [facility] for left foot sole evaluation - corn vs plantar wart Continue current medicines Touch base with Dr Damian about possible low sugars.  Good to see you today  Return in 6 months for physical/wellness visit

## 2023-12-24 NOTE — Assessment & Plan Note (Addendum)
 Chronic, followed by [facility] endocrinology Dr Damian.  Latest A1c reviewed - 5.1%.  Appreciate endo care. Possible lows - will check in with endo about this.

## 2023-12-24 NOTE — Progress Notes (Signed)
 Ph: [phone] Fax: [phone]   Patient ID: Alan Morrison, male    DOB: 03-01-1951, 73 y.o.   MRN: [id number]  This visit was conducted in person.  BP 122/66   Pulse 88   Temp 97.9 F (36.6 C) (Oral)   Ht 5' 7.5 (1.715 m)   Wt 163 lb 8 oz (74.2 kg)   SpO2 98%   BMI 25.23 kg/m    CC: 6 mo DM f/u visit  Subjective:   HPI: Alan Morrison is a 73 y.o. male presenting on 12/24/2023 for Medical Management of Chronic Issues (Here for 6 mo f/u. Last A1c w/ endo on 12/21/23, A1c- 5.1.)   Has upcoming endocrinology and neurosurgery appts next week.   DM - does regularly check sugars twice daily, about 3-4 times a week, latest sugar 84 this morning. Compliant with antihyperglycemic regimen which includes: basaglar  50 units nightly, metformin  1000mg  bid, mounjaro  12.5mg  weekly. Occ lows. Denies paresthesias, blurry vision. Last diabetic eye exam 06/2023. Glucometer brand: OneTouch Ultra. Last foot exam: DUE. DSME: completed 2018. Not interested in CGM.  Lab Results  Component Value Date   HGBA1C 5.4 06/15/2023   Diabetic Foot Exam - Simple   Simple Foot Form Diabetic Foot exam was performed with the following findings: Yes 12/24/2023  9:28 AM  Visual Inspection See comments: Yes Sensation Testing Intact to touch and monofilament testing bilaterally: Yes Pulse Check See comments: Yes Comments No claudication 2+ DP on right, 1+ DP on left Callus to medial great toes Left lateral sole with tender thickening of skin    Lab Results  Component Value Date   MICROALBUR 6.4 (H) 06/15/2023     OSA on CPAP followed by pulm last saw Tammy Parrett NP 11/2023. Has new machine, having trouble with mask fit.   Cervical spondylosis followed by neurosurgery last saw Glade Boys PA 10/2023, note reviewed. ESI declined by insurance. Underwent outpatient PT. Neck pain, lightheadedness and blurry vision when he looks down. PT didn't really help. He now wonders if these symptoms may be associated  with hypoglycemia.  Subsequently CTA head/neck 09/2023 showed mild ATH to head and neck without large vessel occlusion, significant stenosis or aneurysm. He continues rosuvastatin  40mg  daily.   MRI cervical spine 07/2023 IMPRESSION: 1. Chronic cervical spine degeneration most pronounced at C4-C5 and C5-C6. Faint degenerative endplate marrow edema at the latter. Mild degenerative spondylolisthesis at C7-T1. 2. No cervical spinal stenosis. Moderate to severe bilateral C6 neural foraminal stenosis, up to moderate at the right C5 and C7 nerve levels.      Relevant past medical, surgical, family and social history reviewed and updated as indicated. Interim medical history since our last visit reviewed. Allergies and medications reviewed and updated. Outpatient Medications Prior to Visit  Medication Sig Dispense Refill  . acetaminophen  (TYLENOL ) 500 MG tablet Take 1,000 mg by mouth 2 (two) times daily as needed for headache (pain).    . aspirin  EC 81 MG tablet Take 81 mg by mouth daily with supper. Swallow whole.    . Calcium  Carbonate Antacid (TUMS PO) Take 1 tablet by mouth daily as needed (acid reflux/indigestion).    . Cholecalciferol  (VITAMIN D ) 2000 units tablet Take 2,000 Units by mouth daily with breakfast.     . finasteride  (PROSCAR ) 5 MG tablet Take 1 tablet (5 mg total) by mouth daily. 90 tablet 4  . Insulin  Pen Needle (B-D ULTRAFINE III SHORT PEN) 31G X 8 MM MISC 1 Device by Other route 4 (four) times  daily. USE AS DIRECTED DAILY 120 each 11  . Iron , Ferrous Sulfate , 325 (65 Fe) MG TABS Take 325 mg by mouth every other day.    . losartan  (COZAAR ) 25 MG tablet TAKE 1 TABLET (25 MG TOTAL) BY MOUTH DAILY. 90 tablet 3  . metFORMIN  (GLUCOPHAGE ) 1000 MG tablet Take 1 tablet (1,000 mg total) by mouth 2 (two) times daily with a meal.    . metoprolol  tartrate (LOPRESSOR ) 25 MG tablet TAKE 1/2 TABLET TWICE A DAY BY MOUTH 90 tablet 3  . Multiple Vitamin (MULTIVITAMIN WITH MINERALS) TABS tablet Take 1  tablet by mouth daily with breakfast.     . nitroGLYCERIN  (NITROSTAT ) 0.4 MG SL tablet PLACE 1 TABLET UNDER THE TONGUE EVERY 5 MINUTES AS NEEDED FOR CHEST PAIN 25 tablet 11  . ONETOUCH ULTRA test strip USE TO CHECK SUGARS DAILY  AS DIRECTED 100 strip 3  . probenecid  (BENEMID ) 500 MG tablet Take 0.5 tablets (250 mg total) by mouth every other day. 45 tablet 4  . rivaroxaban  (XARELTO ) 10 MG TABS tablet Take 1 tablet (10 mg total) by mouth daily. 90 tablet 1  . rosuvastatin  (CRESTOR ) 40 MG tablet Take 1 tablet (40 mg total) by mouth daily with supper. 90 tablet 3  . tamsulosin  (FLOMAX ) 0.4 MG CAPS capsule Take 1 capsule (0.4 mg total) by mouth daily. 90 capsule 4  . tirzepatide  (MOUNJARO ) 12.5 MG/0.5ML Pen Inject 12.5 mg into the skin once a week.    . Insulin  Glargine (BASAGLAR  KWIKPEN) 100 UNIT/ML Inject 60 Units into the skin at bedtime.    . Insulin  Glargine (BASAGLAR  KWIKPEN) 100 UNIT/ML Inject 50 Units into the skin at bedtime.     No facility-administered medications prior to visit.     Per HPI unless specifically indicated in ROS section below Review of Systems  Objective:  BP 122/66   Pulse 88   Temp 97.9 F (36.6 C) (Oral)   Ht 5' 7.5 (1.715 m)   Wt 163 lb 8 oz (74.2 kg)   SpO2 98%   BMI 25.23 kg/m   Wt Readings from Last 3 Encounters:  12/29/23 163 lb (73.9 kg)  12/24/23 163 lb 8 oz (74.2 kg)  11/29/23 162 lb 9.6 oz (73.8 kg)      Physical Exam Vitals and nursing note reviewed.  Constitutional:      Appearance: Normal appearance. He is not ill-appearing.  HENT:     Head: Normocephalic and atraumatic.     Mouth/Throat:     Mouth: Mucous membranes are moist.     Pharynx: Oropharynx is clear. No posterior oropharyngeal erythema.  Eyes:     Extraocular Movements: Extraocular movements intact.     Conjunctiva/sclera: Conjunctivae normal.     Pupils: Pupils are equal, round, and reactive to light.  Neck:     Thyroid : No thyroid  mass or thyromegaly.  Cardiovascular:      Rate and Rhythm: Normal rate and regular rhythm.     Pulses: Normal pulses.     Heart sounds: Normal heart sounds. No murmur heard. Pulmonary:     Effort: Pulmonary effort is normal. No respiratory distress.     Breath sounds: Normal breath sounds. No wheezing, rhonchi or rales.  Musculoskeletal:     Cervical back: Normal range of motion and neck supple.     Right lower leg: No edema.     Left lower leg: No edema.     Comments: See HPI for foot exam if done  Skin:  General: Skin is warm and dry.     Findings: No rash.  Neurological:     Mental Status: He is alert.  Psychiatric:        Mood and Affect: Mood normal.        Behavior: Behavior normal.       Results for orders placed or performed in visit on 06/22/23  HM DIABETES EYE EXAM   Collection Time: 06/22/23  4:54 PM  Result Value Ref Range   HM Diabetic Eye Exam No Retinopathy No Retinopathy   Lab Results  Component Value Date   CHOL 99 06/15/2023   HDL 29.20 (L) 06/15/2023   LDLCALC 50 06/15/2023   TRIG 98.0 06/15/2023   CHOLHDL 3 06/15/2023    Assessment & Plan:   Problem List Items Addressed This Visit     Type 2 diabetes mellitus with chronic kidney disease, with long-term current use of insulin  (HCC) - Primary   Chronic, followed by [facility] endocrinology Dr Damian.  Latest A1c reviewed - 5.1%.  Appreciate endo care. Possible lows - will check in with endo about this.       Relevant Medications   Insulin  Glargine (BASAGLAR  KWIKPEN) 100 UNIT/ML   Other Relevant Orders   Ambulatory referral to Podiatry   OSA on CPAP   Appreciate pulm care - working on CPAP mask adjustment in setting of significant weight loss.       Hyperlipidemia associated with type 2 diabetes mellitus (HCC)   Continue Crestor  40mg  daily. Latest LDL 50, non-HDL 70.       Relevant Medications   Insulin  Glargine (BASAGLAR  KWIKPEN) 100 UNIT/ML   Cervical spondylosis without myelopathy   Ongoing, followed by neurosurgery,  appreciate their care.  Most recently underwent physical therapy course, discussing possible steroid injections.       Dizziness   Ongoing dizziness with neck flexion.  I did ask him to check blood pressure and sugar readings next time this happens to ensure not hypoglycemia/hypotension related. He will also touch base with endocrinology about possible hypoglycemic contribution      Carotid artery calcification, bilateral   Mild ATH of head and neck arteries without major occlusion/stenosis on CTA head/neck 09/2023.  Continue high potency statin, good BP and glycemic control.       Left foot pain   Painful corn vs plantar wart to L lateral sole affecting prolonged ambulation - in diabetic, will refer to podiatry.       Relevant Orders   Ambulatory referral to Podiatry   CKD stage 3a, GFR 45-59 ml/min (HCC)   GFR staying 45-50 over the past 6 months         No orders of the defined types were placed in this encounter.   Orders Placed This Encounter  Procedures  . Ambulatory referral to Podiatry    Referral Priority:   Routine    Referral Type:   Consultation    Referral Reason:   Specialty Services Required    Requested Specialty:   Podiatry    Number of Visits Requested:   1    Patient Instructions  We will refer you to podiatry in [facility] for left foot sole evaluation - corn vs plantar wart Continue current medicines Touch base with Dr Damian about possible low sugars.  Good to see you today  Return in 6 months for physical/wellness visit   Follow up plan: Return in about 6 months (around 06/22/2024) for annual exam, prior fasting for blood work, [employer]  visit.  Anton Blas, MD

## 2023-12-25 ENCOUNTER — Encounter: Payer: Self-pay | Admitting: Family Medicine

## 2023-12-25 DIAGNOSIS — M79672 Pain in left foot: Secondary | ICD-10-CM | POA: Insufficient documentation

## 2023-12-25 NOTE — Assessment & Plan Note (Signed)
 Mild ATH of head and neck arteries without major occlusion/stenosis on CTA head/neck 09/2023.  Continue high potency statin, good BP and glycemic control.

## 2023-12-25 NOTE — Assessment & Plan Note (Signed)
 Continue Crestor  40mg  daily. Latest LDL 50, non-HDL 70.

## 2023-12-25 NOTE — Assessment & Plan Note (Signed)
 Ongoing, followed by neurosurgery, appreciate their care.  Most recently underwent physical therapy course, discussing possible steroid injections.

## 2023-12-25 NOTE — Assessment & Plan Note (Signed)
 Appreciate pulm care - working on CPAP mask adjustment in setting of significant weight loss.

## 2023-12-25 NOTE — Assessment & Plan Note (Signed)
 Painful corn vs plantar wart to L lateral sole affecting prolonged ambulation - in diabetic, will refer to podiatry.

## 2023-12-25 NOTE — Assessment & Plan Note (Signed)
 Ongoing dizziness with neck flexion.  I did ask him to check blood pressure and sugar readings next time this happens to ensure not hypoglycemia/hypotension related. He will also touch base with endocrinology about possible hypoglycemic contribution

## 2023-12-27 ENCOUNTER — Encounter: Payer: Self-pay | Admitting: Cardiology

## 2023-12-27 NOTE — Telephone Encounter (Signed)
 I have sent urgent message to [employer] about this issue

## 2023-12-27 NOTE — Telephone Encounter (Signed)
 I received a message from [place] with [employer], Adine   Here is the last note on the acocunt from the Local RT team: 12-10-2023 947am : Called pt. and LVM for a mask fitting please inform patient he is not eligible through his insurance until 9/26.  Also looks like he was offered virtual mask fitting on 12-09-2023 1153am but declined.  The pressure changed was completed 12-08-2023 447pm.  I will ask the local branch to reach out again to explain mask fit options.

## 2023-12-27 NOTE — Telephone Encounter (Signed)
 Order was placed on 11/29/2023 can someone check on this?

## 2023-12-28 DIAGNOSIS — N1832 Chronic kidney disease, stage 3b: Secondary | ICD-10-CM | POA: Diagnosis not present

## 2023-12-28 DIAGNOSIS — I10 Essential (primary) hypertension: Secondary | ICD-10-CM | POA: Diagnosis not present

## 2023-12-28 DIAGNOSIS — E1159 Type 2 diabetes mellitus with other circulatory complications: Secondary | ICD-10-CM | POA: Diagnosis not present

## 2023-12-28 DIAGNOSIS — Z1331 Encounter for screening for depression: Secondary | ICD-10-CM | POA: Diagnosis not present

## 2023-12-28 DIAGNOSIS — Z794 Long term (current) use of insulin: Secondary | ICD-10-CM | POA: Diagnosis not present

## 2023-12-28 DIAGNOSIS — E11649 Type 2 diabetes mellitus with hypoglycemia without coma: Secondary | ICD-10-CM | POA: Diagnosis not present

## 2023-12-28 DIAGNOSIS — E782 Mixed hyperlipidemia: Secondary | ICD-10-CM | POA: Diagnosis not present

## 2023-12-28 DIAGNOSIS — E1122 Type 2 diabetes mellitus with diabetic chronic kidney disease: Secondary | ICD-10-CM | POA: Diagnosis not present

## 2023-12-29 ENCOUNTER — Encounter: Payer: Self-pay | Admitting: Podiatry

## 2023-12-29 ENCOUNTER — Ambulatory Visit (INDEPENDENT_AMBULATORY_CARE_PROVIDER_SITE_OTHER)

## 2023-12-29 ENCOUNTER — Ambulatory Visit: Admitting: Podiatry

## 2023-12-29 VITALS — Ht 67.0 in | Wt 163.0 lb

## 2023-12-29 DIAGNOSIS — Z794 Long term (current) use of insulin: Secondary | ICD-10-CM | POA: Diagnosis not present

## 2023-12-29 DIAGNOSIS — D689 Coagulation defect, unspecified: Secondary | ICD-10-CM

## 2023-12-29 DIAGNOSIS — M216X2 Other acquired deformities of left foot: Secondary | ICD-10-CM

## 2023-12-29 DIAGNOSIS — Q828 Other specified congenital malformations of skin: Secondary | ICD-10-CM

## 2023-12-29 DIAGNOSIS — N1831 Chronic kidney disease, stage 3a: Secondary | ICD-10-CM

## 2023-12-29 DIAGNOSIS — E1122 Type 2 diabetes mellitus with diabetic chronic kidney disease: Secondary | ICD-10-CM | POA: Diagnosis not present

## 2023-12-29 DIAGNOSIS — M7752 Other enthesopathy of left foot: Secondary | ICD-10-CM | POA: Diagnosis not present

## 2023-12-29 NOTE — Progress Notes (Unsigned)
 Chief Complaint  Patient presents with   Plantar Warts    L 5th meta possible plantar wart.  1 yr but started getting more sore in the last 2 months now sore to touch.  Diabetic A1c 5.1.  Xarelto     HPI: 73 y.o. male presents today with painful skin lesion left subfifth metatarsal head.  He is diabetic, well-controlled.  He is on long-term Xarelto .  Area has been bothering him for close to a year and its become more sore to touch.  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   by US    Arthritis Early 2000's   Bilateral pulmonary embolism (HCC) 02/05/2022   Cervical spondylosis without myelopathy    Choroidal nevus 01/22/2011   left, yearly eye exam, no diabetic retinopathy   COVID-19 virus infection 12/03/2022   Diabetes mellitus type II    DVT of deep femoral vein, left (HCC) 04/29/2018   LLE DVT from proximal femoral vein to popliteal vein into proximal calf (04/29/2018) started on xarelto  Heme (Magrinat) recommended continuing lifelong lower dose anticoagulant indefinitely (11/2018)   Dyspepsia    Ex-smoker    Fatty liver 02/2016   by US    GERD (gastroesophageal reflux disease)    Headache(784.0)    Heart murmur    Hx of   HLD (hyperlipidemia)    Hyperuricemia    Obesity    Sleep apnea    Stress-induced cardiomyopathy 09/27/1998   WNL, EF 64%   Urosepsis 01/01-01/05/10   Hospitalization    Past Surgical History:  Procedure Laterality Date   BIOPSY  10/10/2022   Procedure: BIOPSY;  Surgeon: Federico Rosario BROCKS, MD;  Location[facility] END[facility]COPY;  Service: Gastroenterology;;   CARDIAC CATHETERIZATION     COLONOSCOPY  07/2013   1 benign polyp rpt 10 yrs Verda)   COLONOSCOPY WITH PROPOFOL  N/A 10/10/2022   Procedure: COLONOSCOPY WITH PROPOFOL ;  Surgeon: Federico Rosario BROCKS, MD;  Location: [facility] ENDOSCOPY;  Service: Gastroenterology;  Laterality: N/A;   CORONARY THROMBECTOMY N/A 11/12/2019   Procedure: Coronary Thrombectomy;  Surgeon: Claudene Victory ORN, MD;  Location: [facility] INVASIVE CV LAB;  Service: Cardiovascular;  Laterality: N/A;   CORONARY ULTRASOUND/IVUS N/A 01/30/2022   Procedure: Intravascular Ultrasound/IVUS;  Surgeon: Gretta Lonni PARAS, MD;  Location: [facility] INVASIVE CV LAB;  Service: Cardiovascular;  Laterality: N/A;  Venous   CORONARY/GRAFT ACUTE MI REVASCULARIZATION N/A 11/12/2019   Procedure: Coronary/Graft Acute MI Revascularization;  Surgeon: Claudene Victory ORN, MD;  Location: [facility] INVASIVE CV LAB;  Service: Cardiovascular;  Laterality: N/A;   KNEE ARTHROSCOPY Left 1988   KNEE SURGERY Right 05/21/03   Right, Dr. Addie, med meniscus tear via MRI   LEFT HEART CATH AND CORONARY ANGIOGRAPHY N/A 04/01/2017   Procedure: LEFT HEART CATH AND CORONARY ANGIOGRAPHY;  Surgeon: Swaz[place], Peter M, MD;  Location: [facility] INVASIVE CV LAB;  Service: Cardiovascular;  Laterality: N/A;   LEFT HEART CATH AND CORONARY ANGIOGRAPHY N/A 11/12/2019   Procedure: LEFT HEART CATH AND CORONARY ANGIOGRAPHY;  Surgeon: Claudene Victory ORN, MD;  Location: [facility] INVASIVE CV LAB;  Service: Cardiovascular;  Laterality: N/A;   MYELOGRAM  08/05   bulging disc   PERIPHERAL VASCULAR BALLOON ANGIOPLASTY  01/30/2022   Procedure: PERIPHERAL VASCULAR BALLOON ANGIOPLASTY;  Surgeon: Gretta Lonni  J, MD;  Location: [facility] INVASIVE CV LAB;  Service: Cardiovascular;;   PERIPHERAL VASCULAR THROMBECTOMY Right 01/30/2022   Procedure: PERIPHERAL VASCULAR THROMBECTOMY;  Surgeon: Gretta Lonni PARAS, MD;  Location: [facility] INVASIVE CV LAB;  Service: Cardiovascular;  Laterality: Right;    No Known Allergies  ROS denies any nausea, vomiting, fever, chills, chest pain, shortness of breath   Physical Exam: There were no vitals filed for this visit.  General: The patient is alert and oriented x3 in no acute  distress.  Dermatology: Pedal skin atrophic.  Nailplates well-maintained.  Significant preulcerative hyperkeratotic skin lesion with nucleated hyperkeratotic core present left subfifth metatarsal head that is tender to touch.  Vascular: Palpable DP and PT pedal pulses bilaterally. Capillary refill within normal limits.  Decreased pedal hair growth.  No appreciable edema.  No erythema or calor.  Neurological: Light touch sensation grossly intact bilateral feet.  Protective sensation intact, vibratory sensation intact.  Musculoskeletal Exam: Some fat pad atrophy present.  Prominent fifth metatarsal head left foot plantarly.  Some tailor's bunion present.  Radiographic Exam: Left foot 3 views weightbearing 12/29/2023 Normal osseous mineralization. Joint spaces preserved.  No fractures or acute osseous irregularities noted.  Wide forefoot present.  Prominent fifth metatarsal head plantarly.  Enlarged fifth metatarsal head noted with slight tailor's bunion.  Assessment/Plan of Care: 1. Plantar flexed metatarsal bone of left foot   2. Porokeratosis   3. Coagulation defect (HCC)   4. Type 2 diabetes mellitus with stage 3a chronic kidney disease, with long-term current use of insulin  (HCC)      No orders of the defined types were placed in this encounter.  None  Discussed clinical findings with patient today.  Radiographs reviewed with the patient.  Discussed findings of tailor's bunion and plantarflexed fifth metatarsal head contributing to the formation of the hyperkeratotic skin lesion left subfifth metatarsal head.  This was debrided as a courtesy today using a #15 blade without incident to patient tolerance.  Reviewed offloading with patient.  Discussed shoe modification as well.  Briefly discussed surgical correction of the tailor's bunion and prominent fifth met head.  Favor conservative treatment at this time.  Is on long-term anticoagulation therapy due to history of DVT.  Patient  educated on diabetes. Discussed proper diabetic foot care and discussed risks and complications of disease. Educated patient in depth on reasons to return to the office immediately should he/she discover anything concerning or new on the feet. All questions answered. Discussed proper shoes as well.   Interested in diabetic footcare going forward, will follow-up in 3 months or sooner if symptoms worsen.    Bernedette Auston L. Lamount MAUL, AACFAS [facility]     [address] [place], [place] [address]                Office [phone]  Fax [phone]

## 2023-12-30 NOTE — Progress Notes (Unsigned)
   My Chart Video Visit- Progress Note: Referring Physician:  Rilla Baller, MD [address] [place],  [place] [address]  Primary Physician:  Rilla Baller, MD  This visit was performed via MyChart/video.   Patient location: home Provider location: working from home  I spent a total of 10 minutes non-face-to-face activities for this visit on the date of this encounter including review of current clinical condition and response to treatment.    Patient has given verbal consent to this MyChart video visit and we reviewed the limitations of a MyChart video visit. Patient wishes to proceed.    Chief Complaint:  follow up after PT  History of Present Illness: Alan Morrison is a 73 y.o. male has a history of   CAD, HTN, cardiomyopathy, history of recurrent DVTs, OSA, GERD, fatty liver, ischemic colitis, DM, hyperlipidemia, vertigo, BPH.    Last seen by me on 11/09/23 for intermittent neck pain when he looks down. He has known cervical spondylosis with DDD C4-C7. He has multilevel foraminal stenosis and slight slip C7-T1. I don't appreciate a significant slip at C3-C4   He was sent to PT at his last visit. He was to f/u with PCP regarding mild atherosclerosis in his head and neck.   MyChart visit scheduled to check on his progress.   He had initial evaluation with PT on 11/19/23 and has done 6 additional visits through 12/16/23.   He thinks PT has helped his pain. He is not a stiff as he once was. He still has intermittent pain when he looks down or turns his head. Gets this pain a few times a week- gets better if he can stretch and work it out. No arm pain. No numbness, tingling, or weakness.    He is taking XARELTO . He takes prn tylenol .    He does not smoke.    Conservative measures:  Physical therapy: initial evaluation with PT on 11/19/23 and has done 6 additional visits through 12/16/23 Multimodal medical therapy including regular antiinflammatories:  tylenol  Injections: none   Past Surgery: no spinal surgeries   Alan Morrison has no symptoms of cervical myelopathy.    Exam: General: Patient is well developed, well nourished, calm, collected, and in no apparent distress. Attention to examination is appropriate.  Respiratory: Patient is breathing without any difficulty.    Awake, alert, oriented to person, place, and time.  Speech is clear and fluent. Fund of knowledge is appropriate.    Imaging: none  Assessment and Plan: [name] is doing better after PT. He still has intermittent pain when he looks down or turns his head. Gets this pain a few times a week- gets better if he can stretch and work it out. No arm pain. No numbness, tingling, or weakness.    He has known cervical spondylosis with DDD C4-C7. He has multilevel foraminal stenosis and slight slip C7-T1. I don't appreciate a significant slip at C3-C4   Treatment options discussed with patient and following plan made:   - Continue with HEP from PT.  - Discussed injections with Dr. Marcelino. Will hold off for now as pain is tolerable. Can revisit if he gets worse.  - Will message him in December to check on him. He will f/u prn.   Glade Boys PA-C Neurosurgery

## 2023-12-31 ENCOUNTER — Telehealth: Admitting: Orthopedic Surgery

## 2023-12-31 ENCOUNTER — Encounter: Payer: Self-pay | Admitting: Orthopedic Surgery

## 2023-12-31 DIAGNOSIS — M4802 Spinal stenosis, cervical region: Secondary | ICD-10-CM

## 2023-12-31 DIAGNOSIS — M50322 Other cervical disc degeneration at C5-C6 level: Secondary | ICD-10-CM | POA: Diagnosis not present

## 2023-12-31 DIAGNOSIS — M47812 Spondylosis without myelopathy or radiculopathy, cervical region: Secondary | ICD-10-CM

## 2023-12-31 DIAGNOSIS — M50321 Other cervical disc degeneration at C4-C5 level: Secondary | ICD-10-CM | POA: Diagnosis not present

## 2023-12-31 DIAGNOSIS — M5030 Other cervical disc degeneration, unspecified cervical region: Secondary | ICD-10-CM

## 2023-12-31 DIAGNOSIS — M50323 Other cervical disc degeneration at C6-C7 level: Secondary | ICD-10-CM

## 2024-01-02 DIAGNOSIS — N1831 Chronic kidney disease, stage 3a: Secondary | ICD-10-CM | POA: Insufficient documentation

## 2024-01-02 NOTE — Assessment & Plan Note (Addendum)
 GFR staying 45-50 over the past 6 months

## 2024-01-03 NOTE — Telephone Encounter (Signed)
 Alan Morrison - has the patient been made aware?

## 2024-01-04 NOTE — Telephone Encounter (Signed)
 I called and left a message regarding the information from [employer] and the patient not being eligible until 01/14/24 for mask fit. I told him if he had more questions to call the office back

## 2024-01-14 DIAGNOSIS — G4733 Obstructive sleep apnea (adult) (pediatric): Secondary | ICD-10-CM | POA: Diagnosis not present

## 2024-01-27 NOTE — Progress Notes (Signed)
 Alan Morrison                                          MRN: [id number]   01/27/2024   The [facility] Quality Team Specialist reviewed this patient medical record for the purposes of chart review for care gap closure. The following were reviewed: abstraction for care gap closure-controlling blood pressure.    [facility] Quality Team

## 2024-02-13 DIAGNOSIS — G4733 Obstructive sleep apnea (adult) (pediatric): Secondary | ICD-10-CM | POA: Diagnosis not present

## 2024-03-30 ENCOUNTER — Ambulatory Visit: Admitting: Adult Health

## 2024-04-02 ENCOUNTER — Other Ambulatory Visit: Payer: Self-pay | Admitting: Family Medicine

## 2024-04-02 DIAGNOSIS — I82421 Acute embolism and thrombosis of right iliac vein: Secondary | ICD-10-CM

## 2024-04-06 ENCOUNTER — Ambulatory Visit: Admitting: Podiatry

## 2024-04-14 DIAGNOSIS — G4733 Obstructive sleep apnea (adult) (pediatric): Secondary | ICD-10-CM | POA: Diagnosis not present

## 2024-05-17 NOTE — Progress Notes (Signed)
 " Cardiology Office Note:    Date:  05/23/2024   ID:  Philippe Gang, DOB [birth year], MRN [id number]  PCP:  Rilla Baller, MD  [facility] Cardiologist:  Etrulia Zarr, MD  [facility] Electrophysiologist:  None   Referring MD: Rilla Baller, MD   Chief Complaint  Patient presents with   Coronary artery disease involving native coronary artery of   Follow-up   Coronary Artery Disease    History of Present Illness:    Alan Morrison is a 74 y.o. male with a hx of HLD, DM II, stress cardiomyopathy, OSA on CPAP, history of DVT on Xarelto , former smoker, and CAD.  Patient had nonobstructive CAD by angiography in 2018.  More recently, he presented with inferior wall acute STEMI on 11/12/2019.  Initial EKG did identify inferior Q waves with persistent ST elevation in the inferior lead.  Cardiac catheterization performed on 11/12/2019 showed total occlusion of proximal to mid RCA treated with 3.0 x 22 mm Onyx DES postdilated to 3.5 mm, 30% mid PDA vessel lesion, widely patent left main, 50% proximal to mid LAD lesion, EF 55%, LVEDP 16 mmHg.  Postprocedure, patient was placed on triple therapy including aspirin , Plavix  and Xarelto .  The plan is to discontinue aspirin  after 1 month.  Echocardiogram showed EF 45 to 50%.  During the hospitalization patient also had frequent hypoglycemic spells at home, he was not very compliant with insulin  at home which explains the hypoglycemic episodes when he was placed on the full-strength insulin .  Lovenox  was stopped and he was discharged on long-acting insulin  only with plan to be evaluated by his endocrinologist for further medication adjustment.  He was seen by Dr. Layla on 11/20/2019 who discontinued the Xarelto . He did recommend repeating a D dimer at some point.   He was then admitted in October 2023 with worsening swelling and pain in RLE. LE dopplers this time showed occlusive thrombus in the external iliac, common femoral, superficial femoral, and  deep femoral veins on the right -vascular surgery consulted, patient was taken to the OR on 10/13 and he is status post percutaneous mechanical venous thrombectomy of the right popliteal, superficial femoral, common femoral, iliac vein and distal IVC; Percutaneous mechanical venous thrombectomy of the right popliteal, superficial femoral, common femoral, iliac vein and distal IVC; Angioplasty of the right iliac vein and distal IVC (10 mm x 60 mm Mustang).  He recovered well postoperatively, leg swelling has improved significantly, he is able to ambulate in the hallway without significant difficulties.  He is now on long term anticoagulation.  2D echo shows an EF of 50-55%, grade 1 diastolic dysfunction, RV moderately enlarged with normal systolic function.    Repeat Echo 05/04/22 showed stable EF with resolution of RV strain/dysfunction.  On follow up today he is doing very well.BP and sugar under excellent control. He is staying active. Denies any chest pain, dyspnea or palpitations.    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   by US    Arthritis Early 2000s   Bilateral pulmonary embolism (HCC) 02/05/2022   Cervical spondylosis without myelopathy    Choroidal nevus 01/22/2011   left, yearly eye exam, no diabetic retinopathy   COVID-19 virus infection 12/03/2022   Diabetes mellitus type II    DVT  of deep femoral vein, left (HCC) 04/29/2018   LLE DVT from proximal femoral vein to popliteal vein into proximal calf (04/29/2018) started on xarelto  Heme (Magrinat) recommended continuing lifelong lower dose anticoagulant indefinitely (11/2018)   Dyspepsia    Ex-smoker    Fatty liver 02/2016   by US    GERD (gastroesophageal reflux disease)    Headache(784.0)    Heart murmur    Hx  of   HLD (hyperlipidemia)    Hyperuricemia    Obesity    Sleep apnea    Stress-induced cardiomyopathy 09/27/1998   WNL, EF 64%   Urosepsis 01/01-01/05/10   Hospitalization    Past Surgical History:  Procedure Laterality Date   BIOPSY  10/10/2022   Procedure: BIOPSY;  Surgeon: Federico Rosario BROCKS, MD;  Location: [facility] ENDOSCOPY;  Service: Gastroenterology;;   CARDIAC CATHETERIZATION     COLONOSCOPY  07/2013   1 benign polyp rpt 10 yrs Verda)   COLONOSCOPY WITH PROPOFOL  N/A 10/10/2022   Procedure: COLONOSCOPY WITH PROPOFOL ;  Surgeon: Federico Rosario BROCKS, MD;  Location: [facility] ENDOSCOPY;  Service: Gastroenterology;  Laterality: N/A;   CORONARY THROMBECTOMY N/A 11/12/2019   Procedure: Coronary Thrombectomy;  Surgeon: Claudene Victory ORN, MD;  Location: [facility] INVASIVE CV LAB;  Service: Cardiovascular;  Laterality: N/A;   CORONARY ULTRASOUND/IVUS N/A 01/30/2022   Procedure: Intravascular Ultrasound/IVUS;  Surgeon: Gretta Lonni PARAS, MD;  Location: [facility] INVASIVE CV LAB;  Service: Cardiovascular;  Laterality: N/A;  Venous   CORONARY/GRAFT ACUTE MI REVASCULARIZATION N/A 11/12/2019   Procedure: Coronary/Graft Acute MI Revascularization;  Surgeon: Claudene Victory ORN, MD;  Location: [facility] INVASIVE CV LAB;  Service: Cardiovascular;  Laterality: N/A;   KNEE ARTHROSCOPY Left 1988   KNEE SURGERY Right 05/21/03   Right, Dr. Addie, med meniscus tear via MRI   LEFT HEART CATH AND CORONARY ANGIOGRAPHY N/A 04/01/2017   Procedure: LEFT HEART CATH AND CORONARY ANGIOGRAPHY;  Surgeon: Jodell Weitman M, MD;  Location: [facility] INVASIVE CV LAB;  Service: Cardiovascular;  Laterality: N/A;   LEFT HEART CATH AND CORONARY ANGIOGRAPHY N/A 11/12/2019   Procedure: LEFT HEART CATH AND CORONARY ANGIOGRAPHY;  Surgeon: Claudene Victory ORN, MD;  Location: [facility] INVASIVE CV LAB;  Service: Cardiovascular;  Laterality: N/A;   MYELOGRAM  08/05   bulging disc   PERIPHERAL VASCULAR BALLOON ANGIOPLASTY  01/30/2022   Procedure: PERIPHERAL VASCULAR BALLOON ANGIOPLASTY;   Surgeon: Gretta Lonni PARAS, MD;  Location: [facility] INVASIVE CV LAB;  Service: Cardiovascular;;   PERIPHERAL VASCULAR THROMBECTOMY Right 01/30/2022   Procedure: PERIPHERAL VASCULAR THROMBECTOMY;  Surgeon: Gretta Lonni PARAS, MD;  Location: [facility] INVASIVE CV LAB;  Service: Cardiovascular;  Laterality: Right;    Current Medications: Current Meds  Medication Sig   acetaminophen  (TYLENOL ) 500 MG tablet Take 1,000 mg by mouth 2 (two) times daily as needed for headache (pain).   aspirin  EC 81 MG tablet Take 81 mg by mouth daily with supper. Swallow whole.   Calcium  Carbonate Antacid (TUMS PO) Take 1 tablet by mouth daily as needed (acid reflux/indigestion).   Cholecalciferol  (VITAMIN D ) 2000 units tablet Take 2,000 Units by mouth daily with breakfast.    finasteride  (PROSCAR ) 5 MG tablet Take 1 tablet (5 mg total) by mouth daily.   Insulin  Glargine (BASAGLAR  KWIKPEN) 100 UNIT/ML Inject 50 Units into the skin at bedtime.   Insulin  Pen Needle (B-D ULTRAFINE III SHORT PEN) 31G X 8 MM MISC 1 Device by Other route 4 (four) times daily. USE AS DIRECTED DAILY   Iron , Ferrous Sulfate , 325 (65 Fe) MG  TABS Take 325 mg by mouth every other day.   losartan  (COZAAR ) 25 MG tablet TAKE 1 TABLET (25 MG TOTAL) BY MOUTH DAILY.   metFORMIN  (GLUCOPHAGE ) 1000 MG tablet Take 1 tablet (1,000 mg total) by mouth 2 (two) times daily with a meal.   metoprolol  tartrate (LOPRESSOR ) 25 MG tablet TAKE 1/2 TABLET TWICE A DAY BY MOUTH   Multiple Vitamin (MULTIVITAMIN WITH MINERALS) TABS tablet Take 1 tablet by mouth daily with breakfast.    nitroGLYCERIN  (NITROSTAT ) 0.4 MG SL tablet PLACE 1 TABLET UNDER THE TONGUE EVERY 5 MINUTES AS NEEDED FOR CHEST PAIN   ONETOUCH ULTRA test strip USE TO CHECK SUGARS DAILY  AS DIRECTED   probenecid  (BENEMID ) 500 MG tablet Take 0.5 tablets (250 mg total) by mouth every other day.   rosuvastatin  (CRESTOR ) 40 MG tablet Take 1 tablet (40 mg total) by mouth daily with supper.   tamsulosin  (FLOMAX ) 0.4 MG  CAPS capsule Take 1 capsule (0.4 mg total) by mouth daily.   tirzepatide  (MOUNJARO ) 12.5 MG/0.5ML Pen Inject 12.5 mg into the skin once a week.   XARELTO  10 MG TABS tablet TAKE 1 TABLET BY MOUTH EVERY DAY     Allergies:   Patient has no known allergies.   Social History   Socioeconomic History   Marital status: Married    Spouse name: Not on file   Number of children: 2   Years of education: 12   Highest education level: Associate degree: occupational, scientist, product/process development, or vocational program  Occupational History   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[employer]  Tobacco Use   Smoking status: Former    Current packs/day: 0.00    Average packs/day: 0.5 packs/day for 2.0 years (1.0 ttl pk-yrs)    Types: Cigarettes, Pipe, Cigars    Quit date: 04/20/1974    Years since quitting: 50.1    Passive exposure: Never   Smokeless tobacco: Never   Tobacco comments:    quit over 20 years  Vaping Use   Vaping status: Never Used  Substance and Sexual Activity   Alcohol use: Yes    Alcohol/week: 2.0 standard drinks of alcohol    Types: 1 Cans of beer, 1 Shots of liquor per week    Comment: Ocassional   Drug use: Never   Sexual activity: Not Currently    Partners: Female    Birth control/protection: None    Comment: MARRIED  Other Topics Concern   Not on file  Social History Narrative   Lives with wife   Occupation: equipment maintenance   Activity: no regular exercise - hunts   Diet: good water, fruits/vegetables   Social Drivers of Health   Tobacco Use: Medium Risk (05/23/2024)   Patient History    Smoking Tobacco Use: Former    Smokeless Tobacco Use: Never    Passive Exposure: Never  Physicist, Medical Strain: Low Risk (12/20/2023)   Overall Financial Resource Strain (CARDIA)    Difficulty of Paying Living Expenses: Not very hard  Food Insecurity: No Food Insecurity (12/20/2023)   Epic    Worried About Programme Researcher, Broadcasting/film/video in the Last Year: Never true    Ran Out of  Food in the Last Year: Never true  Transportation Needs: No Transportation Needs (12/20/2023)   Epic    Lack of Transportation (Medical): No    Lack of Transportation (Non-Medical): No  Physical Activity: Insufficiently Active (12/20/2023)   Exercise Vital Sign    Days of Exercise per Week:  2 days    Minutes of Exercise per Session: 20 min  Stress: No Stress Concern Present (12/20/2023)   [employer] of Occupational Health - Occupational Stress Questionnaire    Feeling of Stress: Not at all  Social Connections: Moderately Isolated (12/20/2023)   Social Connection and Isolation Panel    Frequency of Communication with Friends and Family: Three times a week    Frequency of Social Gatherings with Friends and Family: Once a week    Attends Religious Services: Never    Database Administrator or Organizations: No    Attends Engineer, Structural: Not on file    Marital Status: Married  Depression (PHQ2-9): Low Risk (12/24/2023)   Depression (PHQ2-9)    PHQ-2 Score: 2  Alcohol Screen: Low Risk (12/20/2023)   Alcohol Screen    Last Alcohol Screening Score (AUDIT): 1  Housing: Low Risk (12/20/2023)   Epic    Unable to Pay for Housing in the Last Year: No    Number of Times Moved in the Last Year: 0    Homeless in the Last Year: No  Utilities: Not At Risk (10/08/2022)   AHC Utilities    Threatened with loss of utilities: No  Health Literacy: Not on file     Family History: The patient's family history includes CAD (age of onset: 46) in his brother; Cancer in his maternal uncle; Diabetes in his mother; Heart disease in his father and mother; Hypertension in his father; Migraines in his brother; Pulmonary fibrosis in his sister; Sjogren's syndrome in his sister. There is no history of Stroke or Colon cancer.  ROS:   Please see the history of present illness.     All other systems reviewed and are negative.  EKGs/Labs/Other Studies Reviewed:    The following studies were reviewed  today:  EKG Interpretation Date/Time:  Tuesday May 23 2024 08:00:44 EST Ventricular Rate:  79 PR Interval:  212 QRS Duration:  88 QT Interval:  346 QTC Calculation: 396 R Axis:   -11  Text Interpretation: Sinus rhythm with 1st degree A-V block Minimal voltage criteria for LVH, may be normal variant ( R in aVL ) Inferior infarct (cited on or before 24-May-2023) Anterior infarct (cited on or before 24-May-2023) When compared with ECG of 24-May-2023 10:08, Questionable change in initial forces of Anterior leads Confirmed by Arlee Santosuosso [phone]) on 05/23/2024 8:07:33 AM   Cath 11/12/2019 A stent was successfully placed.   Acute inferior ST elevation myocardial infarction with ongoing pain for greater than 9 hours. Totally occluded proximal to mid RCA treated with 22 x 3.0 Onyx DES postdilated to 3.5 mm with TIMI grade III flow and resolution of symptoms.  The PDA contains segmental mid vessel 50 percent stenosis. Widely patent, short left main Widely patent LAD with proximal to mid eccentric 50 percent stenosis and luminal irregularities beyond. Widely patent circumflex with luminal irregularities. LV reveals inferior wall hypokinesis.  EF 55 percent.  LVEDP 16 mmHg.   RECOMMENDATIONS:   Aggressive risk factor modification. If A1c significantly elevated, consider adding SGLT2 therapy. Lipid panel and if LDL greater than 70, intensify management by adding ezetimibe or PCSK9. Phase 2 cardiac rehab.  Echo 01/30/22: IMPRESSIONS     1. Left ventricular ejection fraction, by estimation, is 50 to 55%. The  left ventricle has low normal function. The left ventricle has no regional  wall motion abnormalities. There is moderate concentric left ventricular  hypertrophy. Left ventricular  diastolic parameters are consistent with  Grade I diastolic dysfunction  (impaired relaxation). There is the interventricular septum is flattened  in systole, consistent with right ventricular pressure  overload.   2. +Mconnell's sign. Right ventricular systolic function is normal. The  right ventricular size is moderately enlarged.   3. No evidence of mitral valve regurgitation.   4. The aortic valve is tricuspid. Aortic valve regurgitation is not  visualized.   5. The inferior vena cava is normal in size with greater than 50%  respiratory variability, suggesting right atrial pressure of 3 mmHg.   Conclusion(s)/Recommendation(s): RV dilation and septal flattening  consistent with pulmonary embolism with RV strain.    Echo 05/04/22: IMPRESSIONS     1. Left ventricular ejection fraction, by estimation, is 50 to 55%. The  left ventricle has low normal function. The left ventricle has no regional  wall motion abnormalities. There is mild concentric left ventricular  hypertrophy. Left ventricular  diastolic parameters are consistent with Grade I diastolic dysfunction  (impaired relaxation).   2. Right ventricular systolic function is normal. The right ventricular  size is normal. Tricuspid regurgitation signal is inadequate for assessing  PA pressure.   3. The mitral valve is normal in structure. Trivial mitral valve  regurgitation. No evidence of mitral stenosis.   4. The aortic valve is tricuspid. Aortic valve regurgitation is not  visualized. Aortic valve sclerosis/calcification is present, without any  evidence of aortic stenosis.   5. The inferior vena cava is normal in size with greater than 50%  respiratory variability, suggesting right atrial pressure of 3 mmHg.     EKG Interpretation Date/Time:  Tuesday May 23 2024 08:00:44 EST Ventricular Rate:  79 PR Interval:  212 QRS Duration:  88 QT Interval:  346 QTC Calculation: 396 R Axis:   -11  Text Interpretation: Sinus rhythm with 1st degree A-V block Minimal voltage criteria for LVH, may be normal variant ( R in aVL ) Inferior infarct (cited on or before 24-May-2023) Anterior infarct (cited on or before 24-May-2023)  When compared with ECG of 24-May-2023 10:08, Questionable change in initial forces of Anterior leads Confirmed by Ebon Ketchum [phone]) on 05/23/2024 8:07:33 AM    Recent Labs: 06/15/2023: ALT 21; BUN 40; Creatinine, Ser 1.48; Hemoglobin 14.7; Platelets 194.0; Potassium 4.9; Sodium 143  Recent Lipid Panel    Component Value Date/Time   CHOL 99 06/15/2023 0730   CHOL 112 11/01/2018 0000   TRIG 98.0 06/15/2023 0730   HDL 29.20 (L) 06/15/2023 0730   HDL 37 (L) 11/01/2018 0000   CHOLHDL 3 06/15/2023 0730   VLDL 19.6 06/15/2023 0730   LDLCALC 50 06/15/2023 0730   LDLCALC 59 11/01/2018 0000    Physical Exam:    VS:  BP 112/74 (BP Location: Left Arm, Patient Position: Sitting, Cuff Size: Normal)   Pulse 82   Resp 16   Ht 5' 7 (1.702 m)   Wt 166 lb 9.6 oz (75.6 kg)   SpO2 97%   BMI 26.09 kg/m     Wt Readings from Last 3 Encounters:  05/23/24 166 lb 9.6 oz (75.6 kg)  12/29/23 163 lb (73.9 kg)  12/24/23 163 lb 8 oz (74.2 kg)     GEN:  Well nourished, obese, in no acute distress HEENT: Normal NECK: No JVD; No carotid bruits LYMPHATICS: No lymphadenopathy CARDIAC: RRR, no murmurs, rubs, gallops RESPIRATORY:  Clear to auscultation without rales, wheezing or rhonchi  ABDOMEN: Soft, non-tender, non-distended MUSCULOSKELETAL:  minimal edema right calf. Compression hose in place. Some tenderness  to  SKIN: Warm and dry NEUROLOGIC:  Alert and oriented x 3 PSYCHIATRIC:  Normal affect   ASSESSMENT:    1. Coronary artery disease involving native coronary artery of native heart without angina pectoris   2. Controlled type 2 diabetes mellitus with complication, with long-term current use of insulin  (HCC)   3. Essential hypertension, benign   4. Hypercholesteremia        PLAN:    In order of problems listed above:  CAD: s/p  cardiac catheterization for inferior STEMI and underwent DES to RCA in July 2021. He is asymptomatic.  Continue metoprolol  and statin.  Lifestyle  modification. On ASA  Hypertension: well controlled on current therapy  Hyperlipidemia: last LDL excellent at 46  DM2: significantly improved on Mounjaro . A1c 5.1%.   History of DVT/PE: with documented recurrence in October with RLE DVT and PE. S/p thrombectomy. Now back on Xarelto  indefinitely.   Follow up in one year    Medication Adjustments/Labs and Tests Ordered: Current medicines are reviewed at length with the patient today.  Concerns regarding medicines are outlined above.  Orders Placed This Encounter  Procedures   EKG 12-Lead   No orders of the defined types were placed in this encounter.   Patient Instructions  Medication Instructions:   *If you need a refill on your cardiac medications before your next appointment, please call your pharmacy*  Lab Work:    Testing/Procedures:   Follow-Up: At [facility], you and your health needs are our priority.  As part of our continuing mission to provide you with exceptional heart care, our providers are all part of one team.  This team includes your primary Cardiologist (physician) and Advanced Practice Providers or APPs (Physician Assistants and Nurse Practitioners) who all work together to provide you with the care you need, when you need it.  Your next appointment:      Provider:     We recommend signing up for the patient portal called MyChart.  Sign up information is provided on this After Visit Summary.  MyChart is used to connect with patients for Virtual Visits (Telemedicine).  Patients are able to view lab/test results, encounter notes, upcoming appointments, etc.  Non-urgent messages can be sent to your provider as well.   To learn more about what you can do with MyChart, go to [email or web address].              Signed, Galileo Colello, MD  05/23/2024 8:13 AM    [facility] "

## 2024-05-23 ENCOUNTER — Ambulatory Visit: Admitting: Cardiology

## 2024-05-23 ENCOUNTER — Encounter: Payer: Self-pay | Admitting: Cardiology

## 2024-05-23 VITALS — BP 112/74 | HR 82 | Resp 16 | Ht 67.0 in | Wt 166.6 lb

## 2024-05-23 DIAGNOSIS — I2510 Atherosclerotic heart disease of native coronary artery without angina pectoris: Secondary | ICD-10-CM

## 2024-05-23 DIAGNOSIS — I10 Essential (primary) hypertension: Secondary | ICD-10-CM | POA: Diagnosis not present

## 2024-05-23 DIAGNOSIS — E118 Type 2 diabetes mellitus with unspecified complications: Secondary | ICD-10-CM

## 2024-05-23 DIAGNOSIS — Z794 Long term (current) use of insulin: Secondary | ICD-10-CM

## 2024-05-23 DIAGNOSIS — E7800 Pure hypercholesterolemia, unspecified: Secondary | ICD-10-CM | POA: Diagnosis not present

## 2024-05-23 NOTE — Patient Instructions (Addendum)
 Medication Instructions:  Continue same medications *If you need a refill on your cardiac medications before your next appointment, please call your pharmacy*  Lab Work: None ordered   Testing/Procedures: None ordered  Follow-Up: At [facility], you and your health needs are our priority.  As part of our continuing mission to provide you with exceptional heart care, our providers are all part of one team.  This team includes your primary Cardiologist (physician) and Advanced Practice Providers or APPs (Physician Assistants and Nurse Practitioners) who all work together to provide you with the care you need, when you need it.  Your next appointment:  1 year    Call in Oct to schedule Feb appointment     Provider:  Dr.Jordan   We recommend signing up for the patient portal called MyChart.  Sign up information is provided on this After Visit Summary.  MyChart is used to connect with patients for Virtual Visits (Telemedicine).  Patients are able to view lab/test results, encounter notes, upcoming appointments, etc.  Non-urgent messages can be sent to your provider as well.   To learn more about what you can do with MyChart, go to [email or web address].

## 2024-06-08 ENCOUNTER — Encounter

## 2024-06-09 ENCOUNTER — Ambulatory Visit

## 2024-06-20 ENCOUNTER — Other Ambulatory Visit

## 2024-06-27 ENCOUNTER — Encounter: Admitting: Family Medicine
